# Patient Record
Sex: Female | Born: 1940 | Race: Black or African American | Hispanic: No | Smoking: Former smoker
Health system: Southern US, Community
[De-identification: ages and names within clinical notes are randomized; demographics above are authoritative.]

## PROBLEM LIST (undated history)

## (undated) ENCOUNTER — Emergency Department (HOSPITAL_COMMUNITY): Admission: EM

## (undated) DIAGNOSIS — I639 Cerebral infarction, unspecified: Secondary | ICD-10-CM

## (undated) DIAGNOSIS — I509 Heart failure, unspecified: Secondary | ICD-10-CM

## (undated) DIAGNOSIS — M1990 Unspecified osteoarthritis, unspecified site: Secondary | ICD-10-CM

## (undated) DIAGNOSIS — R51 Headache: Secondary | ICD-10-CM

## (undated) DIAGNOSIS — I10 Essential (primary) hypertension: Secondary | ICD-10-CM

## (undated) DIAGNOSIS — K219 Gastro-esophageal reflux disease without esophagitis: Secondary | ICD-10-CM

## (undated) DIAGNOSIS — K759 Inflammatory liver disease, unspecified: Secondary | ICD-10-CM

## (undated) DIAGNOSIS — Z8739 Personal history of other diseases of the musculoskeletal system and connective tissue: Secondary | ICD-10-CM

## (undated) DIAGNOSIS — I674 Hypertensive encephalopathy: Secondary | ICD-10-CM

## (undated) HISTORY — PX: MULTIPLE TOOTH EXTRACTIONS: SHX2053

## (undated) HISTORY — PX: ABDOMINAL HYSTERECTOMY: SHX81

## (undated) HISTORY — PX: TONSILLECTOMY: SUR1361

---

## 2007-06-06 ENCOUNTER — Inpatient Hospital Stay (HOSPITAL_COMMUNITY): Admission: EM | Admit: 2007-06-06 | Discharge: 2007-06-08 | Payer: Self-pay | Admitting: Emergency Medicine

## 2008-01-14 ENCOUNTER — Encounter (INDEPENDENT_AMBULATORY_CARE_PROVIDER_SITE_OTHER): Payer: Self-pay | Admitting: Diagnostic Radiology

## 2008-01-14 ENCOUNTER — Encounter: Admission: RE | Admit: 2008-01-14 | Discharge: 2008-01-14 | Payer: Self-pay | Admitting: Family Medicine

## 2008-09-01 ENCOUNTER — Encounter: Admission: RE | Admit: 2008-09-01 | Discharge: 2008-10-26 | Payer: Self-pay | Admitting: Family Medicine

## 2009-09-30 ENCOUNTER — Encounter: Payer: Self-pay | Admitting: Orthopedic Surgery

## 2009-10-05 ENCOUNTER — Ambulatory Visit: Payer: Self-pay | Admitting: Orthopedic Surgery

## 2009-10-05 DIAGNOSIS — M7730 Calcaneal spur, unspecified foot: Secondary | ICD-10-CM | POA: Insufficient documentation

## 2009-10-05 DIAGNOSIS — M7660 Achilles tendinitis, unspecified leg: Secondary | ICD-10-CM

## 2009-10-06 ENCOUNTER — Telehealth: Payer: Self-pay | Admitting: Orthopedic Surgery

## 2009-11-10 ENCOUNTER — Encounter: Payer: Self-pay | Admitting: Orthopedic Surgery

## 2010-06-27 NOTE — Letter (Signed)
Summary: Office note from PCP  Office note from PCP   Imported By: Jacklynn Ganong 10/06/2009 08:19:03  _____________________________________________________________________  External Attachment:    Type:   Image     Comment:   External Document

## 2010-06-27 NOTE — Progress Notes (Signed)
Summary: Referral to Dr. Lestine Box.  Phone Note Outgoing Call   Call placed by: Waldon Reining,  Oct 06, 2009 3:45 PM Call placed to: Specialist Action Taken: Information Sent Summary of Call: I faxed a referral for this patient to [facility] to see Dr. Lestine Box.

## 2010-06-27 NOTE — Letter (Signed)
Summary: *Orthopedic Consult Note  [facility]  [address]  [place], [place] [address]   Phone: [phone]  Fax: [phone]    Re:    AMEY HOSSAIN DOB:    1940/09/24   Dear: Dr Lysbeth Galas    Thank you for requesting that we see the above patient for consultation.  A copy of the detailed office note will be sent under separate cover, for your review.  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   Our recommendation is for: referral to  foot and ankle specialist       Thank you for this opportunity to look after your patient.  Sincerely,   Terrance Mass. MD.

## 2010-06-27 NOTE — Assessment & Plan Note (Signed)
Summary: RT HEEL PAIN/BONE SPUR/NEEDS XRAY/REF DR NYLAND/MEDIC,MEDICAI...   Vital Signs:  Patient profile:   70 year old female Height:      62 inches Weight:      145 pounds Pulse rate:   78 / minute Resp:     18 per minute  Vitals Entered By: Fuller Canada MD (Oct 05, 2009 10:17 AM)  Visit Type:  Initial Consult Referring Provider:  Dr. Lysbeth Galas Primary Provider:  Dr. Lysbeth Galas  CC:  rt heel pain.  History of Present Illness: a 70 year old female, who has retired presents with pain in the RIGHT heel for the last year.  She was seen by a local podiatrist and treated with injection and a cam walker, which she wore for 6 weeks. She did not improve.  Her primary care physician has asked for consultation.  She complains of sharp throbbing, stabbing, burning pain in the back of her heel and also plantar proximal. The heel, which is a 9/10, and it is constant. It came on gradually. She reports some catching and locking sensation in the ankle and foot. She also was treated with physical modalities of heat and ice.   Meds: Plavix, Amlodipine, Crestor, Vicodin 5, Robaxin 500, Pat a day Solalco.    Allergies (verified): 1)  ! Pcn  Past History:  Past Medical History: htn cholesterol hx of stroke   Past Surgical History: hysterectomy  Family History: na  Social History: Patient is widowed.  unemployed smokes 1 ppd cigs no alcohol 2 cups of coffee daily  Review of Systems Constitutional:  Denies weight loss, weight gain, fever, chills, and fatigue. Cardiovascular:  Denies chest pain, palpitations, fainting, and murmurs. Respiratory:  Complains of snoring; denies short of breath, wheezing, couch, tightness, pain on inspiration, and snoring . Gastrointestinal:  Complains of constipation; denies heartburn, nausea, vomiting, diarrhea, and blood in your stools. Genitourinary:  Denies frequency, urgency, difficulty urinating, painful urination, flank pain, and bleeding in  urine. Neurologic:  Denies numbness, tingling, unsteady gait, dizziness, tremors, and seizure. Musculoskeletal:  Complains of joint pain, swelling, and muscle pain; denies instability, stiffness, redness, and heat. Endocrine:  Denies excessive thirst, exessive urination, and heat or cold intolerance. Psychiatric:  Denies nervousness, depression, anxiety, and hallucinations. Skin:  Denies changes in the skin, poor healing, rash, itching, and redness. HEENT:  Denies blurred or double vision, eye pain, redness, and watering. Immunology:  Denies seasonal allergies, sinus problems, and allergic to bee stings. Hemoatologic:  Denies easy bleeding and brusing.  Physical Exam  Additional Exam:  Constitutional: vital signs see recorded values. General: normal development, nutrition, and grooming. No deformity. Body Habitus is small CDV: Observation and palpation was normal  Lymph: palpation of the lymph nodes were normal Skin: inspection and palpation of the skin revealed no abnormalities  Neuro: coordination: normal              DTR's normal              Sensation was normal  Psyche: Alert and oriented x 3. Mood was normal.  Affect: normal  MSK: Gait: normal   RIGHT foot. Examination shows she has mild pedis planus. She has poor subtalar motion. She has dorsiflexion 18, plantar flexion, 20. Ankle is stable. She is tender in the calcaneal area as the Achilles insertion to the bone. There is a large Haglund's type deformity and pump bump there. Her swelling of the tendon. There.  Plantarflexion. Strength is normal dorsiflexion, strength is normal.  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 Haglund's  deformity  Orders: Orthopedic Surgeon Referral (Ortho Surgeon) Consultation Level III [phone]) Ankle x-ray complete,  minimum 3 views ([address]454)  Problem # 2:  CALCANEAL SPUR (ICD-726.73) Assessment: New  Orders: Orthopedic Surgeon Referral (Ortho Surgeon) Consultation Level III [phone]) Ankle x-ray complete,  minimum 3 views ([id number])  Patient Instructions: 1)  Dr. Lestine Box 2)  Haglunds deformity right foot

## 2010-06-27 NOTE — Consult Note (Signed)
Summary: Consult [facility] Dr Lestine Box  Consult [facility] Dr Lestine Box   Imported By: Cammie Sickle 11/22/2009 15:32:41  _____________________________________________________________________  External Attachment:    Type:   Image     Comment:   External Document

## 2010-06-27 NOTE — Letter (Signed)
Summary: *Referral Letter  [facility]  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  Sincerely,  Fuller Canada MD

## 2010-06-27 NOTE — Letter (Signed)
Summary: History form  History form   Imported By: Jacklynn Ganong 10/06/2009 08:18:07  _____________________________________________________________________  External Attachment:    Type:   Image     Comment:   External Document

## 2010-10-10 NOTE — H&P (Signed)
NAMEMARQUITTA, [name] NO.:  [id number]   MEDICAL RECORD NO.:  [id number]          PATIENT TYPE:  INP   LOCATION:  A201                          FACILITY:  [facility]   PHYSICIAN:  Skeet Latch, DO    DATE OF BIRTH:  [birth year]   DATE OF ADMISSION:  06/06/2007  DATE OF DISCHARGE:  LH                              HISTORY & PHYSICAL   CHIEF COMPLAINT:  Headache.   HISTORY OF PRESENT ILLNESS:  This is a 70 year old, African-American  female who presents with a complaint of headache. The patient states  that she went to work and started developing a headache with nausea and  slight vomiting as well as chest pain and some mild right-sided weakness  and tingling. The patient states that she had an episode prior,  approximately 4 years ago, when she went to [facility] and was diagnosed  with a TIA. The patient states these symptoms come now and then and  stated that after she had these symptoms she went home and told her  husband that she needed to go to the hospital. EMS was called and the  patient was brought to the emergency room for evaluation. In the  emergency room, the patient was unable to move her right side and stated  that her pain in her right side had been approximately 8/10. In the ER,  it was discussed the patient may need a tPA but the patient's symptoms  started to resolve before tPA was given. The patient states she started  feeling better in the emergency room and her right sided has improved.  She still has weakness but the tingling and weakness is slowly  improving.   PAST MEDICAL HISTORY:  Previous TIAs, gout, hypertension, arthritis.   PAST SURGICAL HISTORY:  Hysterectomy.   SOCIAL HISTORY:  She is a 1-1/2 to 1 pack per day smoker for 50+ years.  She denies any alcohol or illicit drug use.   FAMILY HISTORY:  Unknown, patient was adopted.   ALLERGIES:  PENICILLIN.   CURRENT MEDICATIONS:  Propranolol/HCTZ 80/25 mg once a day.   REVIEW OF  SYSTEMS:  GENERAL:  Denies any fever, chills, weight gain or  weight loss. HEENT:  Positive for headache. CARDIOVASCULAR:  Positive  for slight chest pain, no palpitations. RESPIRATORY:  Denies any cough,  dyspnea or wheezing. GI:  Positive for some nausea and vomiting. No  diarrhea or abdominal pain. GU:  Unremarkable. MUSCULOSKELETAL:  Slight  neck pain. SKIN:  Unremarkable. NEUROLOGIC:  Positive for headache and  some right-sided weakness. PSYCHIATRIC:  Unremarkable.   PHYSICAL EXAMINATION:  VITAL SIGNS:  Temperature is 98.0, pulse 74,  respirations 20, blood pressure 152/71.  HEENT:  Atraumatic, normocephalic. No scleral icterus. EOMI, PERRLA.  Oral mucosa is moist.  NECK:  Soft, supple, nontender, nondistended. No JVD, no thyromegaly.  CARDIOVASCULAR:  Regular rate and rhythm. No murmurs, rubs or gallops.  LUNGS:  Clear to auscultation bilaterally. No rales, rhonchi or  wheezing.  ABDOMEN:  Soft, nontender, nondistended, positive bowel sounds. No  rigidity or guarding.  EXTREMITIES:  No clubbing, cyanosis or edema.  NEUROLOGIC:  Her right side shows +3 strength upper and lower  extremities. Decreased grasp reflex and decreased plantar flexion on the  right lower extremity. The patient is alert and oriented x3. Reflexes  seem to be intact.   LABORATORY DATA:  Sodium 141, potassium 3.8, chloride 107, CO2 29,  glucose 101, BUN 14, creatinine 0.88. CK-MB is less than 1, troponin  less than 0.05. PTT is 34. PT 12.5, INR 0.9. White count is 7.1,  hemoglobin 14.1, hematocrit 43.2, platelets 173.   RADIOLOGIC STUDIES:  The patient had a scan of her head, CT scan, with  no acute intracranial findings. She had a chest x-ray that showed no  acute process. EKG known sinus rhythm with some PVCs, left axis  deviation.   ASSESSMENT:  1. Transient ischemic attack.  2. Hypertension.   PLAN:  1. The patient was admitted to telemetry unit. The patient will be      treated for a TIA. She  will be maintained on aspirin daily.  2. The patient will get a neurologic consult. Secondary to neurology      not available on weekends, the patient will be seen in the next 48      hours. The patient will have neuro checks every 4 hours and then      every 8 hours.  3. Hypertension. The patient will be placed on her home medications      which is propranolol/HCTZ daily.  4. For pain management, the patient will be given oral as well as IV      management to control her headache at this time.   Of note, the emergency room at [facility] spoke with Dr. Orlin Hilding at  [facility] regarding tPA management. It was stated that improving  symptoms and minor symptoms are not an indication for tPA. It was stated  that patient was no longer a candidate for tPA, her symptoms were  resolving and patient had a TIA and not a CVA. It was stated that there  was no intervention that would be provided at [facility] and the patient  will be admitted to [facility] and monitored and treated as a  TIA with hypertension. We will continue to monitor the patient. If the  patient's status changes of any kind, the patient may need to be  transferred to [facility] secondary to we do not have a neurologist here  on the weekends. We will continue to monitor her closely. The patient  does seem to be improving also.      Skeet Latch, DO  Electronically Signed     SM/MEDQ  D:  06/07/2007  T:  06/07/2007  Job:  [id number]

## 2010-10-10 NOTE — Discharge Summary (Signed)
[name], [name] NO.:  [id number]   MEDICAL RECORD NO.:  [id number]          PATIENT TYPE:  INP   LOCATION:  A201                          FACILITY:  [facility]   PHYSICIAN:  Skeet Latch, DO    DATE OF BIRTH:  [birth year]   DATE OF ADMISSION:  06/06/2007  DATE OF DISCHARGE:  01/11/2009LH                               DISCHARGE SUMMARY   DISCHARGE DIAGNOSES:  1. Transfer ischemia attack.  2. Hypertension.   BRIEF HOSPITAL COURSE:  [name] is a 66-year African-American female,  presented with the complaint of headache.  The patient states after  going to work, she started developing a headache with nausea and  vomiting and some slight chest pain and also had some mild right-sided  weakness and tingling.  The patient has a history of two prior episodes  of TIAs and states that thought she was having a mini-stroke at the  time.  The patient states that she went home and told her family and  told her that she thought she was having mini-stroke and called EMS.  EMS arrived and brought to the patient to the emergency room for  evaluation.  The patient did have a prior TIA, approximately 4 years ago  and [facility] and was sent home after a few days.  In the  emergency room, the patient was unable to move her right side and was a  possible candidate for a TPA administration.  Prior to administration of  TPA, the patient's sensation and movement of her right upper and lower  extremity returned, and the patient did not receive TPA.  The patient's  symptoms have slowly improved, and the patient is moving her right upper  and lower extremity without any complaints at this time.  The patient  still has some weakness on her right side but is greatly improved, and  at this time feel the patient is stable enough to be discharged.   MEDICATIONS:  She will discharged on following medications:  1. Norvasc 5 mg 1 tablet p.o. daily.  2. Plavix 75 mg 1 tablet p.o.  daily.  3. Propranolol 80 mg daily.  4. HCTZ 25 mg daily.   EXAMINATION:  Her vitals on discharge:  Temperature is 98.2, pulse 60,  respirations 18, blood pressure 156/68.   LABS:  Sodium 145, potassium 3.8, chloride 111, CO2 29, glucose 100, BUN  7, creatinine 0.89, white count 4.9, hemoglobin 14.0, hematocrit 42.4,  platelet count 148.   CONDITION ON DISCHARGE:  Stable.   DISPOSITION:  The patient will be discharged to home.   INSTRUCTIONS:  Diet:  The patient is to maintain a low sodium, heart-  healthy diet.  The patient is not to do any heavy lifting for next 2 to3  weeks.  The patient is to follow up with her primary care physician in 1  week.  Patient is to have an appointment with a neurologist in the next  2 to  3 weeks.  The patient is also to have home physical therapy,  approximately 2 to 3 times a week for the next 3  to 4 weeks.   I will empirically place the patient on Plavix 75 mg once a day until  seen by the neurologist, secondary to her recurrent episodes of  transient ischemic attacks.  If the neurologist feels the patient does  not need to be on Plavix, this could be discontinued at that time but  feel the patient probably needs to be on Plavix, until she is seen by  neurology.  The patient understands the risks at this time and  understands the benefits.  The patient told to return to emergency room  or call EMS immediately, if she experiences any similar symptoms of this  kind.      Skeet Latch, DO  Electronically Signed     SM/MEDQ  D:  06/08/2007  T:  06/08/2007  Job:  [id number]

## 2010-10-10 NOTE — Group Therapy Note (Signed)
[name], [name] NO.:  [id number]   MEDICAL RECORD NO.:  [id number]          PATIENT TYPE:  INP   LOCATION:  A201                          FACILITY:  [facility]   PHYSICIAN:  Dorris Singh, DO    DATE OF BIRTH:  Aug 26, 1940   DATE OF PROCEDURE:  DATE OF DISCHARGE:                                 PROGRESS NOTE   The patient seen today.  States she has not had any episodes of  weakness, feels a little bit better from yesterday.  Has no complaints.   Her vitals are as follows:  Temperature 98.6, pulse of 57, respiration  16, blood pressure 164/90.  Generally this is an [place] American woman who is in no acute distress.  She is well-developed, well-nourished.  HEART:  Rate is regular rate and rhythm.  No murmurs, rubs, or gallops.  LUNGS:  Clear to auscultation bilaterally.  No wheezes, rales, or  rhonchi.  ABDOMEN:  Soft, nontender, nondistended.  EXTREMITIES:  Positive pulses, no ecchymosis, cyanosis, or edema.   LABS:  There were no labs ordered for today.  Will order labs tomorrow.   ASSESSMENT:  1. Transient ischemic attack.  2. Hypertension.   PLAN:  The patient will be monitored for another 12-18 hours.  If she is  still doing well, will consider discharging her if neurology has not  seen her prior to discharge.  Will also continue with neuro checks.  She  is currently on propranolol, but will place her on Norvasc as well.  The  patient also expressed some concern about getting her medications if she  is discharged on the weekend.  Explained to her that we can make sure  she gets her medications prior to discharge, if that would help her as  well.  We can change the schedule so that this can occur.   Other than that, she is continuing to improve.  She has not had a repeat  episode.  Will continue to monitor and change therapy as necessary.      Dorris Singh, DO  Electronically Signed     CB/MEDQ  D:  06/07/2007  T:  06/07/2007  Job:   [id number]

## 2011-02-15 LAB — BASIC METABOLIC PANEL
BUN: 7
CO2: 29
Calcium: 8.7
Chloride: 111
Creatinine, Ser: 0.89
GFR calc non Af Amer: >60
Glucose, Bld: 100 — ABNORMAL HIGH
Glucose, Bld: 101 — ABNORMAL HIGH
Sodium: 141

## 2011-02-15 LAB — DIFFERENTIAL
Basophils Absolute: 0
Basophils Absolute: 0
Basophils Relative: 1
Eosinophils Absolute: 0.2
Eosinophils Relative: 4
Monocytes Absolute: 0.4
Monocytes Relative: 6
Neutro Abs: 3.7
Neutrophils Relative %: 53

## 2011-02-15 LAB — POCT CARDIAC MARKERS: CKMB, poc: <1 — ABNORMAL LOW

## 2011-02-15 LAB — CBC
HCT: 42.4
MCHC: 32.8
MCHC: 33.1
MCV: 91.5
Platelets: 148 — ABNORMAL LOW
RBC: 4.75
RDW: 14.2
WBC: 7.1

## 2011-02-15 LAB — PROTIME-INR
INR: 0.9
Prothrombin Time: 12.5

## 2011-02-15 LAB — APTT: aPTT: 34

## 2012-02-11 ENCOUNTER — Encounter (HOSPITAL_COMMUNITY): Payer: Self-pay

## 2012-02-11 ENCOUNTER — Emergency Department (HOSPITAL_COMMUNITY): Payer: Medicare Other

## 2012-02-11 ENCOUNTER — Inpatient Hospital Stay (HOSPITAL_COMMUNITY)
Admission: EM | Admit: 2012-02-11 | Discharge: 2012-02-14 | DRG: 078 | Disposition: A | Discharge status: Home / Self Care | Payer: Medicare Other | Attending: Internal Medicine | Admitting: Internal Medicine

## 2012-02-11 DIAGNOSIS — Z79899 Other long term (current) drug therapy: Secondary | ICD-10-CM

## 2012-02-11 DIAGNOSIS — R4789 Other speech disturbances: Secondary | ICD-10-CM | POA: Diagnosis present

## 2012-02-11 DIAGNOSIS — R4781 Slurred speech: Secondary | ICD-10-CM | POA: Diagnosis present

## 2012-02-11 DIAGNOSIS — Z23 Encounter for immunization: Secondary | ICD-10-CM

## 2012-02-11 DIAGNOSIS — I10 Essential (primary) hypertension: Secondary | ICD-10-CM | POA: Diagnosis present

## 2012-02-11 DIAGNOSIS — M6281 Muscle weakness (generalized): Secondary | ICD-10-CM

## 2012-02-11 DIAGNOSIS — R471 Dysarthria and anarthria: Secondary | ICD-10-CM | POA: Diagnosis present

## 2012-02-11 DIAGNOSIS — I674 Hypertensive encephalopathy: Principal | ICD-10-CM | POA: Diagnosis present

## 2012-02-11 DIAGNOSIS — G959 Disease of spinal cord, unspecified: Secondary | ICD-10-CM | POA: Diagnosis present

## 2012-02-11 DIAGNOSIS — M5000 Cervical disc disorder with myelopathy, unspecified cervical region: Secondary | ICD-10-CM | POA: Diagnosis present

## 2012-02-11 DIAGNOSIS — K219 Gastro-esophageal reflux disease without esophagitis: Secondary | ICD-10-CM | POA: Diagnosis present

## 2012-02-11 DIAGNOSIS — Z880 Allergy status to penicillin: Secondary | ICD-10-CM

## 2012-02-11 DIAGNOSIS — F17200 Nicotine dependence, unspecified, uncomplicated: Secondary | ICD-10-CM | POA: Diagnosis present

## 2012-02-11 DIAGNOSIS — I6350 Cerebral infarction due to unspecified occlusion or stenosis of unspecified cerebral artery: Secondary | ICD-10-CM

## 2012-02-11 DIAGNOSIS — Z8673 Personal history of transient ischemic attack (TIA), and cerebral infarction without residual deficits: Secondary | ICD-10-CM

## 2012-02-11 DIAGNOSIS — R531 Weakness: Secondary | ICD-10-CM | POA: Diagnosis present

## 2012-02-11 DIAGNOSIS — R5381 Other malaise: Secondary | ICD-10-CM | POA: Diagnosis present

## 2012-02-11 DIAGNOSIS — I639 Cerebral infarction, unspecified: Secondary | ICD-10-CM

## 2012-02-11 HISTORY — DX: Cerebral infarction, unspecified: I63.9

## 2012-02-11 HISTORY — DX: Essential (primary) hypertension: I10

## 2012-02-11 HISTORY — DX: Gastro-esophageal reflux disease without esophagitis: K21.9

## 2012-02-11 LAB — RAPID URINE DRUG SCREEN, HOSP PERFORMED
Amphetamines: NOT DETECTED
Benzodiazepines: NOT DETECTED
Cocaine: NOT DETECTED
Opiates: NOT DETECTED
Tetrahydrocannabinol: NOT DETECTED

## 2012-02-11 LAB — CBC WITH DIFFERENTIAL/PLATELET
Basophils Absolute: 0 10*3/uL (ref 0.0–0.1)
Basophils Relative: 0 % (ref 0–1)
Eosinophils Relative: 2 % (ref 0–5)
Lymphocytes Relative: 40 % (ref 12–46)
MCHC: 33 g/dL (ref 30.0–36.0)
Neutro Abs: 3 10*3/uL (ref 1.7–7.7)
Platelets: 127 10*3/uL — ABNORMAL LOW (ref 150–400)
RDW: 13.3 % (ref 11.5–15.5)
WBC: 5.8 10*3/uL (ref 4.0–10.5)

## 2012-02-11 LAB — URINALYSIS, ROUTINE W REFLEX MICROSCOPIC
Bilirubin Urine: NEGATIVE
Glucose, UA: NEGATIVE mg/dL
Hgb urine dipstick: NEGATIVE
Specific Gravity, Urine: 1.01 (ref 1.005–1.030)
Urobilinogen, UA: 0.2 mg/dL (ref 0.0–1.0)

## 2012-02-11 LAB — ETHANOL: Alcohol, Ethyl (B): <11 mg/dL (ref 0–11)

## 2012-02-11 LAB — COMPREHENSIVE METABOLIC PANEL
ALT: 8 U/L (ref 0–35)
AST: 14 U/L (ref 0–37)
Albumin: 3.6 g/dL (ref 3.5–5.2)
CO2: 29 mEq/L (ref 19–32)
Calcium: 9.1 mg/dL (ref 8.4–10.5)
Chloride: 104 mEq/L (ref 96–112)
Creatinine, Ser: 0.85 mg/dL (ref 0.50–1.10)
GFR calc non Af Amer: 68 mL/min — ABNORMAL LOW (ref >90)
Sodium: 142 mEq/L (ref 135–145)

## 2012-02-11 NOTE — H&P (Signed)
Chief Complaint:  Left side weakness  HPI: 71 yo female 4 days of left sided weakness and one day of slurred speech.  Symptoms have almost completely resolved.  No n/v/d/fevers.  No headache.  No blurred vision.  No problem with gait. No cp or sob.  No rashes.  Review of Systems:  O/w neg  Past Medical History: Past Medical History  Diagnosis Date  . Stroke   . Hypertension   . Seizures   . GERD (gastroesophageal reflux disease)    Past Surgical History  Procedure Date  . Abdominal hysterectomy     Medications: Prior to Admission medications   Medication Sig Start Date End Date Taking? Authorizing Provider  amLODipine (NORVASC) 10 MG tablet Take 10 mg by mouth daily.   Yes Historical Provider, MD  clopidogrel (PLAVIX) 75 MG tablet Take 75 mg by mouth daily.   Yes Historical Provider, MD  Olopatadine HCl (PATADAY) 0.2 % SOLN Place 1 drop into both eyes daily.   Yes Historical Provider, MD  rosuvastatin (CRESTOR) 40 MG tablet Take 40 mg by mouth daily.   Yes Historical Provider, MD  varenicline (CHANTIX) 1 MG tablet Take 1 mg by mouth daily.   Yes Historical Provider, MD    Allergies:   Allergies  Allergen Reactions  . Penicillins Rash    Social History:  reports that she has been smoking.  She does not have any smokeless tobacco history on file. She reports that she does not drink alcohol or use illicit drugs.  Physical Exam: Filed Vitals:   02/11/12 1540 02/11/12 1742 02/11/12 1800 02/11/12 1931  BP: 185/73 156/64 150/81   Pulse: 64 63    Temp: 98.6 F (37 C)     TempSrc: Oral     Resp: 14 18 19    Height: 5\' 2"  (1.575 m)     Weight: 70.308 kg (155 lb)     SpO2: 100% 98%  98%   General appearance: alert, cooperative and no distress Lungs: clear to auscultation bilaterally Heart: regular rate and rhythm, S1, S2 normal, no murmur, click, rub or gallop Abdomen: soft, non-tender; bowel sounds normal; no masses,  no organomegaly Extremities: extremities normal,  atraumatic, no cyanosis or edema Pulses: 2+ and symmetric Skin: Skin color, texture, turgor normal. No rashes or lesions Neurologic: Grossly normal    Labs on Admission:   [facility] 02/11/12 1621  NA 142  K 3.9  CL 104  CO2 29  GLUCOSE 102*  BUN 14  CREATININE 0.85  CALCIUM 9.1  MG --  PHOS --    Basename 02/11/12 1621  AST 14  ALT 8  ALKPHOS 57  BILITOT 0.2*  PROT 6.9  ALBUMIN 3.6    Basename 02/11/12 1621  WBC 5.8  NEUTROABS 3.0  HGB 15.9*  HCT 48.2*  MCV 92.0  PLT 127*    Basename 02/11/12 1621  CKTOTAL --  CKMB --  CKMBINDEX --  TROPONINI <0.30   Radiological Exams on Admission: Dg Chest 2 View  02/11/2012  *RADIOLOGY REPORT*  Clinical Data: Slurred speech.  Smoker.  CHEST - 2 VIEW  Comparison: 06/06/2007  Findings: Heart size and mediastinal contours appear normal.  No pleural effusion or edema identified.  No airspace consolidation noted.  There is mild spondylosis noted within the thoracic spine.  IMPRESSION:  1.  No acute cardiopulmonary abnormalities.   Original Report Authenticated By: Rosealee Albee, M.D.    Ct Head Wo Contrast  02/11/2012  *RADIOLOGY REPORT*  Clinical Data: Headache,  slurred speech, left-sided weakness, history of right-sided CVA  CT HEAD WITHOUT CONTRAST  Technique:  Contiguous axial images were obtained from the base of the skull through the vertex without contrast.  Comparison: None available  Findings: Scattered periventricular and basal ganglia hypodensities compatible with microvascular ischemic disease.  Basal ganglia calcifications.  No CT evidence of acute large territory infarct. No intraparenchymal or extra-axial mass or hemorrhage.  Normal size and configuration of the ventricles and basilar cisterns.  No midline shift.  Exuberant calcification within the midline of the cerebral falx.  Hyperostosis frontalis.  Limited visualization of the paranasal sinuses and mastoid air cells are normal.  Regional soft tissues are normal.   No displaced calvarial fracture.  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   Original Report Authenticated By: Waynard Reeds, M.D.     Assessment/Plan Present on Admission:  71 yo female with slurred speech and left sided weakness .CVA (cerebral infarction) .Slurred speech .Left-sided weakness .HTN (hypertension)  R/o cva/tia.  cva pathway with full w/u.  Neuro consult.  Already on plavix add asa.  Cth neg.  ekg nsr.  Chamille Werntz A [phone] 02/11/2012, 7:52 PM

## 2012-02-11 NOTE — ED Notes (Signed)
Pt brought to er by [facility] from Dr. Joyce Copa office for c/o weakness. Pt states that she started having a headache 4 days ago, left side weakness that started yesterday, pt had walked to dr. Isidore Moos today with request to have blood pressure checked, pt states that her husband noticed that she started having slurred speech around 4am, pt c/o headache, left side weakness. Pt speech slurred at times, stutters at other times when she is in conversation.

## 2012-02-11 NOTE — ED Notes (Addendum)
Attempted to call report to RN for room 301.  Secretary states she will call me back.

## 2012-02-11 NOTE — ED Notes (Signed)
Dr. Onalee Hua in to speak with patient.  Patient agrees to be admitted. Has called and made arrangements for someone to stay with her spouse.

## 2012-02-11 NOTE — ED Notes (Addendum)
Due to answer on 1st part of the swallow  Screen  Questions - patient did NOT PASS swallow screen and advised will remain NPO until further evaluation.  States for the past month she has been choking on thin liquids at times - has not een on any special diet.  Also states that at times she must wipe her mouth because she feels like she is drooling.  . This also has been ongoing for past month, also has no teeth and thinks this contributes to her difficulty. No drool noted by this RN Order for SLP bedside swallow evaluation entered.

## 2012-02-11 NOTE — ED Notes (Signed)
Monitor NSR with rare PVC.  Patient desires to speak to admitting MD prior to admission.  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

## 2012-02-11 NOTE — ED Notes (Signed)
Room request changed to Telemetry after Dr. Onalee Hua in to speak with patient.

## 2012-02-11 NOTE — ED Notes (Signed)
Pt reports woke up this am at 4am and husband noticed she had slurred speech.  Says speech has been getting worse throughout the day.  Pt walked to her doctor's office to have bp checked.  Says bp was elevated at doctor's office.  160/112 at office, ems got 186/91.    EMS says pt has some left sided facial droop but is able to smile and raise eyebrows.  Also c/o left sided weakness.  Pt is able to move extremities but has some weakness.  Also c/o intermittent cramping in left shoulder.  Reports headache x 4 days.

## 2012-02-11 NOTE — ED Notes (Signed)
Hospitalist paged to advise of patients desire to speak with her prior to admission.

## 2012-02-11 NOTE — ED Provider Notes (Signed)
History    This chart was scribed for [employer]. Rubin Payor, MD, MD by Smitty Pluck. The patient was seen in room APA05 and the patient's care was started at 3:51PM.   CSN: [id number]  Arrival date & time 02/11/12  1537       Chief Complaint  Patient presents with  . Aphasia  . Headache    (Consider location/radiation/quality/duration/timing/severity/associated sxs/prior treatment) Patient is a 71 y.o. female presenting with headaches. The history is provided by the patient. No language interpreter was used.  Headache  Associated symptoms include nausea. Pertinent negatives include no fever, no shortness of breath and no vomiting.   [name] is a 71 y.o. female who presents to the Emergency Department sent by PCP complaining of constant, moderate headache onset 4 days ago and aphasia onset 1 day ago. Pt reports that she had stroke in past and left with right side weakness. She reports having left side weakness since yesterday. She went to PCP today for headaches and was told that her BP was high 160/112. Denies photophobia, chest pain, fever. Reports mild numbness, nausea and blurred vision. Denies any other pain.    Past Medical History  Diagnosis Date  . Stroke   . Hypertension   . Seizures   . GERD (gastroesophageal reflux disease)     Past Surgical History  Procedure Date  . Abdominal hysterectomy     No family history on file.  History  Substance Use Topics  . Smoking status: Current Some Day Smoker  . Smokeless tobacco: Not on file  . Alcohol Use: No    OB History    Grav Para Term Preterm Abortions TAB SAB Ect Mult Living                  Review of Systems  Constitutional: Negative for fever and chills.  Eyes: Negative for photophobia.  Respiratory: Negative for cough and shortness of breath.   Cardiovascular: Negative for chest pain.  Gastrointestinal: Positive for nausea. Negative for vomiting and diarrhea.  Neurological: Positive for weakness,  numbness and headaches.    Allergies  Penicillins  Home Medications  No current outpatient prescriptions on file.  BP 185/73  Pulse 64  Temp 98.6 F (37 C) (Oral)  Resp 14  Ht 5\' 2"  (1.575 m)  Wt 155 lb (70.308 kg)  BMI 28.35 kg/m2  SpO2 100%  Physical Exam  Nursing note and vitals reviewed. Constitutional: She is oriented to person, place, and time. She appears well-developed. No distress.  HENT:  Head: Normocephalic and atraumatic.  Eyes: EOM are normal.       Right lid droop  Cardiovascular: Normal rate, regular rhythm and normal heart sounds.   No murmur heard. Pulmonary/Chest: Effort normal and breath sounds normal. No respiratory distress. She has no wheezes. She has no rales.  Abdominal: Soft. She exhibits no distension. There is no tenderness. There is no rebound.  Neurological: She is alert and oriented to person, place, and time.       Radial, medial and ulnar nerve intact  Decreased straight leg raise on left side Decreased sensation of left face, left upper extremity and left lower extremity   Nl bilateral grip strength   Skin: Skin is warm and dry.  Psychiatric: She has a normal mood and affect. Her behavior is normal.    ED Course  Procedures (including critical care time) DIAGNOSTIC STUDIES: Oxygen Saturation is 100% on room air, normal by my interpretation.    COORDINATION  OF CARE: 3:55 PM Discussed pt ED treatment with pt   Results for orders placed during the hospital encounter of 02/11/12  CBC WITH DIFFERENTIAL      Component Value Range   WBC 5.8  4.0 - 10.5 K/uL   RBC 5.24 (*) 3.87 - 5.11 MIL/uL   Hemoglobin 15.9 (*) 12.0 - 15.0 g/dL   HCT 45.4 (*) 09.8 - 11.9 %   MCV 92.0  78.0 - 100.0 fL   MCH 30.3  26.0 - 34.0 pg   MCHC 33.0  30.0 - 36.0 g/dL   RDW 14.7  82.9 - 56.2 %   Platelets 127 (*) 150 - 400 K/uL   Neutrophils Relative 52  43 - 77 %   Neutro Abs 3.0  1.7 - 7.7 K/uL   Lymphocytes Relative 40  12 - 46 %   Lymphs Abs 2.3  0.7 -  4.0 K/uL   Monocytes Relative 5  3 - 12 %   Monocytes Absolute 0.3  0.1 - 1.0 K/uL   Eosinophils Relative 2  0 - 5 %   Eosinophils Absolute 0.1  0.0 - 0.7 K/uL   Basophils Relative 0  0 - 1 %   Basophils Absolute 0.0  0.0 - 0.1 K/uL  COMPREHENSIVE METABOLIC PANEL      Component Value Range   Sodium 142  135 - 145 mEq/L   Potassium 3.9  3.5 - 5.1 mEq/L   Chloride 104  96 - 112 mEq/L   CO2 29  19 - 32 mEq/L   Glucose, Bld 102 (*) 70 - 99 mg/dL   BUN 14  6 - 23 mg/dL   Creatinine, Ser 1.30  0.50 - 1.10 mg/dL   Calcium 9.1  8.4 - 86.5 mg/dL   Total Protein 6.9  6.0 - 8.3 g/dL   Albumin 3.6  3.5 - 5.2 g/dL   AST 14  0 - 37 U/L   ALT 8  0 - 35 U/L   Alkaline Phosphatase 57  39 - 117 U/L   Total Bilirubin 0.2 (*) 0.3 - 1.2 mg/dL   GFR calc non Af Amer 68 (*) >90 mL/min   GFR calc Af Amer 79 (*) >90 mL/min  URINALYSIS, ROUTINE W REFLEX MICROSCOPIC      Component Value Range   Color, Urine YELLOW  YELLOW   APPearance CLEAR  CLEAR   Specific Gravity, Urine 1.010  1.005 - 1.030   pH 7.0  5.0 - 8.0   Glucose, UA NEGATIVE  NEGATIVE mg/dL   Hgb urine dipstick NEGATIVE  NEGATIVE   Bilirubin Urine NEGATIVE  NEGATIVE   Ketones, ur NEGATIVE  NEGATIVE mg/dL   Protein, ur NEGATIVE  NEGATIVE mg/dL   Urobilinogen, UA 0.2  0.0 - 1.0 mg/dL   Nitrite NEGATIVE  NEGATIVE   Leukocytes, UA NEGATIVE  NEGATIVE  ETHANOL      Component Value Range   Alcohol, Ethyl (B) <11  0 - 11 mg/dL  URINE RAPID DRUG SCREEN (HOSP PERFORMED)      Component Value Range   Opiates NONE DETECTED  NONE DETECTED   Cocaine NONE DETECTED  NONE DETECTED   Benzodiazepines NONE DETECTED  NONE DETECTED   Amphetamines NONE DETECTED  NONE DETECTED   Tetrahydrocannabinol NONE DETECTED  NONE DETECTED   Barbiturates NONE DETECTED  NONE DETECTED  TROPONIN I      Component Value Range   Troponin I <0.30  <0.30 ng/mL    Dg Chest 2 View  02/11/2012  *  RADIOLOGY REPORT*  Clinical Data: Slurred speech.  Smoker.  CHEST - 2 VIEW   Comparison: 06/06/2007  Findings: Heart size and mediastinal contours appear normal.  No pleural effusion or edema identified.  No airspace consolidation noted.  There is mild spondylosis noted within the thoracic spine.  IMPRESSION:  1.  No acute cardiopulmonary abnormalities.   Original Report Authenticated By: Rosealee Albee, M.D.    Ct Head Wo Contrast  02/11/2012  *RADIOLOGY REPORT*  Clinical Data: Headache, slurred speech, left-sided weakness, history of right-sided CVA  CT HEAD WITHOUT CONTRAST  Technique:  Contiguous axial images were obtained from the base of the skull through the vertex without contrast.  Comparison: None available  Findings: Scattered periventricular and basal ganglia hypodensities compatible with microvascular ischemic disease.  Basal ganglia calcifications.  No CT evidence of acute large territory infarct. No intraparenchymal or extra-axial mass or hemorrhage.  Normal size and configuration of the ventricles and basilar cisterns.  No midline shift.  Exuberant calcification within the midline of the cerebral falx.  Hyperostosis frontalis.  Limited visualization of the paranasal sinuses and mastoid air cells are normal.  Regional soft tissues are normal.  No displaced calvarial fracture.  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   Original Report Authenticated By: Waynard Reeds, M.D.    6:08 PM Recheck: Discussed lab results and treatment course with pt. Pt will be admitted.    No diagnosis found.   Date: 02/11/2012  Rate: 62  Rhythm: normal sinus rhythm  QRS Axis: normal  Intervals: normal  ST/T Wave abnormalities: normal  Conduction Disutrbances:q waves anteriorly  Narrative Interpretation:   Old EKG Reviewed: changes noted    MDM  Patient with left-sided weakness slurred speech and facial droop. Previous strokes, but with reported right-sided weakness. Blood pressure has been elevated at the doctor's office.  Somewhat inconsistent exam. Patient shows no bleed on head CT. She'll be admitted to medicine for further workup.   *I personally performed the services described in this documentation, which was scribed in my presence. The recorded information has been reviewed and considered.        Juliet Rude. Rubin Payor, MD 02/11/12 [phone]

## 2012-02-11 NOTE — ED Notes (Signed)
Call back in 5 minutes per RN - in with another patient at present

## 2012-02-12 ENCOUNTER — Inpatient Hospital Stay (HOSPITAL_COMMUNITY): Payer: Medicare Other

## 2012-02-12 DIAGNOSIS — I6789 Other cerebrovascular disease: Secondary | ICD-10-CM

## 2012-02-12 LAB — LIPID PANEL: LDL Cholesterol: 61 mg/dL (ref 0–99)

## 2012-02-12 LAB — HEMOGLOBIN A1C: Hgb A1c MFr Bld: 5.7 % — ABNORMAL HIGH (ref <5.7)

## 2012-02-12 MED ORDER — DEXAMETHASONE SODIUM PHOSPHATE 10 MG/ML IJ SOLN: 10.0000 mg | Freq: Four times a day (QID) | INTRAMUSCULAR | Status: DC

## 2012-02-12 MED ORDER — DEXAMETHASONE SODIUM PHOSPHATE 10 MG/ML IJ SOLN
10.0000 mg | Freq: Four times a day (QID) | INTRAMUSCULAR | Status: AC
  Administered 2012-02-12 – 2012-02-13 (×4): 10 mg via INTRAVENOUS
  Filled 2012-02-12 (×4): qty 1

## 2012-02-12 MED ORDER — ATORVASTATIN CALCIUM 40 MG PO TABS
80.0000 mg | ORAL_TABLET | Freq: Every day | ORAL | Status: DC
  Administered 2012-02-12 – 2012-02-13 (×2): 80 mg via ORAL
  Filled 2012-02-12 (×4): qty 2

## 2012-02-12 MED ORDER — CLOPIDOGREL BISULFATE 75 MG PO TABS
75.0000 mg | ORAL_TABLET | Freq: Every day | ORAL | Status: DC
  Administered 2012-02-12 – 2012-02-14 (×3): 75 mg via ORAL
  Filled 2012-02-12 (×3): qty 1

## 2012-02-12 MED ORDER — PANTOPRAZOLE SODIUM 40 MG PO TBEC
40.0000 mg | DELAYED_RELEASE_TABLET | Freq: Every day | ORAL | Status: DC
  Administered 2012-02-12 – 2012-02-13 (×2): 40 mg via ORAL
  Filled 2012-02-12 (×2): qty 1

## 2012-02-12 MED ORDER — ASPIRIN 300 MG RE SUPP
300.0000 mg | Freq: Every day | RECTAL | Status: DC
  Filled 2012-02-12 (×4): qty 1

## 2012-02-12 MED ORDER — DEXAMETHASONE SODIUM PHOSPHATE 4 MG/ML IJ SOLN
4.0000 mg | Freq: Four times a day (QID) | INTRAMUSCULAR | Status: DC
  Administered 2012-02-13 – 2012-02-14 (×4): 4 mg via INTRAVENOUS
  Filled 2012-02-12 (×5): qty 1

## 2012-02-12 MED ORDER — SODIUM CHLORIDE 0.9 % IV SOLN
INTRAVENOUS | Status: AC
  Administered 2012-02-12: 01:00:00 via INTRAVENOUS

## 2012-02-12 MED ORDER — ASPIRIN 325 MG PO TABS
325.0000 mg | ORAL_TABLET | Freq: Every day | ORAL | Status: DC
  Administered 2012-02-12 – 2012-02-14 (×3): 325 mg via ORAL
  Filled 2012-02-12 (×3): qty 1

## 2012-02-12 MED ORDER — AMLODIPINE BESYLATE 5 MG PO TABS
10.0000 mg | ORAL_TABLET | Freq: Every day | ORAL | Status: DC
  Administered 2012-02-12 – 2012-02-14 (×3): 10 mg via ORAL
  Filled 2012-02-12 (×3): qty 2

## 2012-02-12 MED ORDER — LISINOPRIL 10 MG PO TABS
10.0000 mg | ORAL_TABLET | Freq: Every day | ORAL | Status: DC
  Administered 2012-02-12 – 2012-02-13 (×2): 10 mg via ORAL
  Filled 2012-02-12 (×2): qty 1

## 2012-02-12 MED ORDER — VARENICLINE TARTRATE 1 MG PO TABS
1.0000 mg | ORAL_TABLET | Freq: Every day | ORAL | Status: DC
  Administered 2012-02-12 – 2012-02-14 (×3): 1 mg via ORAL
  Filled 2012-02-12 (×4): qty 1

## 2012-02-12 MED ORDER — INFLUENZA VIRUS VACC SPLIT PF IM SUSP
0.5000 mL | INTRAMUSCULAR | Status: AC
  Administered 2012-02-13: 0.5 mL via INTRAMUSCULAR
  Filled 2012-02-12: qty 0.5

## 2012-02-12 NOTE — Progress Notes (Signed)
In view of the normal MRI brain scan, I had ordered a cervical spine MRI. This was highly abnormal with changes seen at T6-C7 and C7-T1. There is some compression of the cord at C6-C7. I discussed this case and MRI findings with neurosurgery, Dr. Danielle Dess (phone [phone]). Dr. Danielle Dess has recommended intravenous Decadron which have started the patient on. He recommended to finish up the workup for CVA and if the patient has not improved with the use of steroids, that he should be called back as the patient may indeed need surgery.

## 2012-02-12 NOTE — Progress Notes (Signed)
Subjective: This lady was admitted with left-sided weakness in the arm and the leg and left-sided facial numbness as well as slurred speech. So far the workup is really negative with MRI brain scan is entirely normal with no evidence of acute CVA. She feels that she is also improving as far as her weakness is concerned. She has elevated blood pressure.           Physical Exam: Blood pressure 171/84, pulse 70, temperature 97.6 F (36.4 C), temperature source Oral, resp. rate 18, height 5\' 2"  (1.575 m), weight 70.4 kg (155 lb 3.3 oz), SpO2 98.00%. She looks systemically well. There is no facial asymmetry. There is objectively some left arm and left leg weakness. Heart sounds are present and normal. Lung fields are clear. She is alert and orientated.   Investigations:     Basic Metabolic Panel:  Basename 02/11/12 1621  NA 142  K 3.9  CL 104  CO2 29  GLUCOSE 102*  BUN 14  CREATININE 0.85  CALCIUM 9.1  MG --  PHOS --   Liver Function Tests:  [facility] 02/11/12 1621  AST 14  ALT 8  ALKPHOS 57  BILITOT 0.2*  PROT 6.9  ALBUMIN 3.6     CBC:  Basename 02/11/12 1621  WBC 5.8  NEUTROABS 3.0  HGB 15.9*  HCT 48.2*  MCV 92.0  PLT 127*    Dg Chest 2 View  02/11/2012  *RADIOLOGY REPORT*  Clinical Data: Slurred speech.  Smoker.  CHEST - 2 VIEW  Comparison: 06/06/2007  Findings: Heart size and mediastinal contours appear normal.  No pleural effusion or edema identified.  No airspace consolidation noted.  There is mild spondylosis noted within the thoracic spine.  IMPRESSION:  1.  No acute cardiopulmonary abnormalities.   Original Report Authenticated By: Rosealee Albee, M.D.    Ct Head Wo Contrast  02/11/2012  *RADIOLOGY REPORT*  Clinical Data: Headache, slurred speech, left-sided weakness, history of right-sided CVA  CT HEAD WITHOUT CONTRAST  Technique:  Contiguous axial images were obtained from the base of the skull through the vertex without contrast.   Comparison: None available  Findings: Scattered periventricular and basal ganglia hypodensities compatible with microvascular ischemic disease.  Basal ganglia calcifications.  No CT evidence of acute large territory infarct. No intraparenchymal or extra-axial mass or hemorrhage.  Normal size and configuration of the ventricles and basilar cisterns.  No midline shift.  Exuberant calcification within the midline of the cerebral falx.  Hyperostosis frontalis.  Limited visualization of the paranasal sinuses and mastoid air cells are normal.  Regional soft tissues are normal.  No displaced calvarial fracture.  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   Original Report Authenticated By: Waynard Reeds, M.D.    Mr [facility] Wo Contrast  02/12/2012  *RADIOLOGY REPORT*  Clinical Data:  4 days of headaches and left-sided weakness and slurred speech for 1 day without known injury.  Hypertension.  MRI BRAIN WITHOUT CONTRAST MRA HEAD WITHOUT CONTRAST  Technique: Multiplanar, multiecho pulse sequences of the brain and surrounding structures were obtained according to standard protocol without intravenous contrast.  Angiographic images of the head were obtained using MRA technique without contrast.  Comparison: 02/11/2012 CT.  No comparison MR.  MRI HEAD  Findings:  No acute infarct.  No intracranial hemorrhage.  Moderate small vessel disease type changes.  No hydrocephalus.  Major intracranial vascular structures are patent.  Hyperostosis frontalis interna.  Ossification of the falx  incidentally noted.  Mild exophthalmos.  Partially empty sella.  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 This may be an incidental finding.  If the patient develops diplopia with upper gaze then this can be reevaluated to exclude growth.  Prominence of soft tissue in the posterior-superior nasopharynx may represent  complex Thornwaldt cysts.  Mucosal abnormality not excluded.   No secondary findings of eustachian tube dysfunction.  IMPRESSION: No acute infarct.  Moderate small vessel disease type changes.  No hydrocephalus.  Pineal cyst as noted above.  Prominence of soft tissue in the posterior-superior nasopharynx may represent  complex Thornwaldt cysts.  Mucosal abnormality not excluded.  No secondary findings of eustachian tube dysfunction.  MRA HEAD  Findings: Anterior circulation without medium or large size vessel significant stenosis or occlusion.  Scattered tiny bulges of the M1 segment of the middle cerebral artery bilaterally which may represent origin of vessels rather than aneurysms at the resolution of the present exam.  Left vertebral artery is dominant.  Mild to moderate narrowing of the distal right vertebral artery.  Moderate irregularity of the PICA bilaterally.  Nonvisualization AICA bilaterally.  Mild irregularity and narrowing involving portions of the superior cerebellar arteries and posterior cerebral arteries.  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   Original Report Authenticated By: Fuller Canada, M.D.    Mr Brain Wo Contrast  02/12/2012  *RADIOLOGY REPORT*  Clinical Data:  4 days of headaches and left-sided weakness and slurred speech for 1 day without known injury.  Hypertension.  MRI BRAIN WITHOUT CONTRAST MRA HEAD WITHOUT CONTRAST  Technique: Multiplanar, multiecho pulse sequences of the brain and surrounding structures were obtained according to standard protocol without intravenous contrast.  Angiographic images of the head were obtained using MRA technique without contrast.  Comparison: 02/11/2012 CT.  No comparison MR.  MRI HEAD  Findings:  No acute infarct.  No intracranial hemorrhage.  Moderate small vessel disease type changes.  No hydrocephalus.  Major intracranial vascular structures are patent.  Hyperostosis frontalis interna.  Ossification of the falx incidentally noted.  Mild  exophthalmos.  Partially empty sella.  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 This may be an incidental finding.  If the patient develops diplopia with upper gaze then this can be reevaluated to exclude growth.  Prominence of soft tissue in the posterior-superior nasopharynx may represent  complex Thornwaldt cysts.  Mucosal abnormality not excluded.  No secondary findings of eustachian tube dysfunction.  IMPRESSION: No acute infarct.  Moderate small vessel disease type changes.  No hydrocephalus.  Pineal cyst as noted above.  Prominence of soft tissue in the posterior-superior nasopharynx may represent  complex Thornwaldt cysts.  Mucosal abnormality not excluded.  No secondary findings of eustachian tube dysfunction.  MRA HEAD  Findings: Anterior circulation without medium or large size vessel significant stenosis or occlusion.  Scattered tiny bulges of the M1 segment of the middle cerebral artery bilaterally which may represent origin of vessels rather than aneurysms at the resolution of the present exam.  Left vertebral artery is dominant.  Mild to moderate narrowing of the distal right vertebral artery.  Moderate irregularity of the PICA bilaterally.  Nonvisualization AICA bilaterally.  Mild irregularity and narrowing involving portions of the superior cerebellar arteries and posterior cerebral arteries.  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   Original Report Authenticated By: Fuller Canada, M.D.  Medications: I have reviewed the patient's current medications.  Impression: 1. Left-sided weakness, unclear etiology. 2. Hypertensive urgency.     Plan: 1. Add lisinopril to her antihypertensive medications. 2. MRI cervical spine to find a source of her left-sided weakness. 3. Neurology consultation.     LOS: 1 day   Wilson Singer Pager [phone]  02/12/2012, 10:44 AM

## 2012-02-12 NOTE — Evaluation (Signed)
Clinical/Bedside Swallow Evaluation Patient Details  Name: [name] MRN: [id number] Date of Birth: [birth year]  Today's Date: 02/12/2012 Time: 1100-1129 SLP Time Calculation (min): 29 min  Past Medical History:  Past Medical History  Diagnosis Date  . Stroke   . Hypertension   . Seizures   . GERD (gastroesophageal reflux disease)    Past Surgical History:  Past Surgical History  Procedure Date  . Abdominal hysterectomy    HPI:  [name] is a 71 yo female who was admitted after 4 days of left sided weakness and 1 day of slurred speech. Her symptoms have nearly resolved per patient. CT head showed microvascular ischemic disease without definite acute intracranial process. She initially failed the RN swallow screen, but then passed it once she was moved to the floor.    Assessment / Plan / Recommendation Clinical Impression  [name] reports a 4-day history of dysphagia with liquids and sometimes coughs on her saliva. She says this has gotten much better however. Pt with GERD and sometimes takes PPI once per day and sometimes twice. One cough elicited after straw sips thin water. Pt cued to take single sips but swallow two times with head in neutral to down position and this seemed to help. Pt does have some limited lingual ROM to left sulcus so suspect some premature spillage of liquids. Pt voiced understanding of using compensatory strategies for swallowing thin liquids and was able to demonstrate independently.    Aspiration Risk  Mild    Diet Recommendation Regular;Thin liquid   Liquid Administration via: Cup;No straw Medication Administration: Whole meds with liquid Supervision: Patient able to self feed Compensations: Small sips/bites;Multiple dry swallows after each bite/sip Postural Changes and/or Swallow Maneuvers: Seated upright 90 degrees;Upright 30-60 min after meal    Other  Recommendations Oral Care Recommendations: Oral care BID;Patient  independent with oral care Other Recommendations: Clarify dietary restrictions   Follow Up Recommendations  None    Frequency and Duration min 1 x/week  1 week      SLP Swallow Goals Patient will consume recommended diet without observed clinical signs of aspiration with: Independent assistance Patient will utilize recommended strategies during swallow to increase swallowing safety with: Independent assistance   Swallow Study Prior Functional Status  Type of Home: House Lives With: Spouse Available Help at Discharge: Family Vocation: Retired    General Date of Onset: 02/08/12 HPI: [name] is a 71 yo female who was admitted after 4 days of left sided weakness and 1 day of slurred speech. Her symptoms have nearly resolved per patient. CT head showed microvascular ischemic disease without definite acute intracranial process. She initially failed the RN swallow screen, but then passed it once she was moved to the floor.  Type of Study: Bedside swallow evaluation Diet Prior to this Study: Regular;Thin liquids Temperature Spikes Noted: No Respiratory Status: Room air History of Recent Intubation: No Behavior/Cognition: Alert;Cooperative;Pleasant mood Oral Cavity - Dentition: Edentulous Self-Feeding Abilities: Able to feed self Patient Positioning: Upright in bed Baseline Vocal Quality: Clear Volitional Cough: Strong Volitional Swallow: Able to elicit    Oral/Motor/Sensory Function Overall Oral Motor/Sensory Function: Appears within functional limits for tasks assessed Lingual ROM: Reduced left Facial Symmetry: Within Functional Limits Velum: Within Functional Limits Mandible: Within Functional Limits   Ice Chips Ice chips: Within functional limits Presentation: Spoon   Thin Liquid Thin Liquid: Impaired Presentation: Straw;Cup Pharyngeal  Phase Impairments: Suspected delayed Swallow;Cough - Immediate Other Comments: Immediate cough after initial straw  sips of water,  no coughing when cued to take one cup sip at a time with 2 swallows, head neutral to down    Nectar Thick Nectar Thick Liquid: Not tested   Honey Thick Honey Thick Liquid: Not tested   Puree Puree: Within functional limits Presentation: Spoon;Self Fed   Solid   Thank you,  Havery Moros, CCC-SLP [phone]     Solid: Within functional limits Presentation: Self Fed       Ellie Spickler 02/12/2012,1:22 PM

## 2012-02-12 NOTE — Progress Notes (Signed)
UR Chart Review Completed  

## 2012-02-12 NOTE — Evaluation (Signed)
Physical Therapy Evaluation Patient Details Name: [name] MRN: [id number] DOB: [birth year] Today's Date: 02/12/2012 Time: 1000-1033 PT Time Calculation (min): 33 min  PT Assessment / Plan / Recommendation Clinical Impression  Pt was seen for eval.  She is very close to functional baseline, but on MMT she has some significant weakness in the L hip which does affect high level balance.  She  ambulates about 3 miles/day at home normally.  This might be enough to help her improve her strength, but she could consider outpatient PT for formal strengthening/balance focus.    PT Assessment  All further PT needs can be met in the next venue of care    Follow Up Recommendations  Outpatient PT    Barriers to Discharge        Equipment Recommendations  None recommended by PT    Recommendations for Other Services     Frequency      Precautions / Restrictions Precautions Precautions: None Restrictions Weight Bearing Restrictions: No   Pertinent Vitals/Pain       Mobility  Bed Mobility Bed Mobility: Supine to Sit;Sit to Supine Supine to Sit: 7: Independent Sit to Supine: 7: Independent Transfers Transfers: Sit to Stand;Stand to Sit Sit to Stand: 7: Independent Stand to Sit: 7: Independent    Exercises     PT Diagnosis: Difficulty walking  PT Problem List: Decreased strength;Decreased balance PT Treatment Interventions:     PT Goals    Visit Information  Last PT Received On: 02/12/12    Subjective Data  Subjective: I'm feeling better Patient Stated Goal: needs to be able to take care of her husband   Prior Functioning  Home Living Lives With: Spouse Available Help at Discharge: Family Type of Home: House Home Access: Level entry Home Layout: One level Home Adaptive Equipment: None Prior Function Level of Independence: Independent Able to Take Stairs?: Yes Driving: No Vocation: Retired Musician: No difficulties Dominant Hand: Right    Cognition  Overall Cognitive Status: Appears within functional limits for tasks assessed/performed Arousal/Alertness: Awake/alert Orientation Level: Appears intact for tasks assessed Behavior During Session: [facility] for tasks performed    Extremity/Trunk Assessment Right Lower Extremity Assessment RLE ROM/Strength/Tone: Deficits RLE ROM/Strength/Tone Deficits: significant ankle weakness in PF and DF, 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.....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 RLE Sensation: WFL - Light Touch;WFL - Proprioception RLE Coordination: WFL - gross motor Left Lower Extremity Assessment LLE ROM/Strength/Tone: Deficits LLE ROM/Strength/Tone Deficits: strength at hip is 2/5 on MMT, but functionally strength is functional...knee strength is 4/5...ankle strength is 2/5 LLE Sensation: WFL - Light Touch;WFL - Proprioception LLE Coordination: WFL - gross motor   Balance Balance Balance Assessed: Yes Dynamic Standing Balance Dynamic Standing - Balance Support: No upper extremity supported Dynamic Standing - Level of Assistance: 4: Min assist High Level Balance High Level Balance Activites: Side stepping;Backward walking;Direction changes High Level Balance Comments: no obvious decrease in balance as long as she moves slowly, but balance falters with quick movements or when truning her head (?visual problem)  End of Session PT - End of Session Equipment Utilized During Treatment: Gait belt Activity Tolerance: Patient tolerated treatment well Patient left: in bed;with call bell/phone within reach Nurse Communication: Mobility status  GP     Konrad Penta 02/12/2012, 10:48 AM

## 2012-02-12 NOTE — Progress Notes (Signed)
*  PRELIMINARY RESULTS* Echocardiogram 2D Echocardiogram has been performed.  [name] 02/12/2012, 2:20 PM

## 2012-02-12 NOTE — Care Management Note (Signed)
    Page 1 of 1   02/14/2012     11:34:49 AM   CARE MANAGEMENT NOTE 02/14/2012  Patient:  [name], [name]   Account Number:  [id number]  Date Initiated:  02/12/2012  Documentation initiated by:  Sharrie Rothman  Subjective/Objective Assessment:   Pt admitted from home with possible CVA. Pt lives with husband and is very independent with ADL's. Pt will return home at discharge.     Action/Plan:   PT recommends outpt PT. Pt has requested the outpt clinic in [place]. Pt uses [facility] for transportation to MD appts and will use [facility] for transportation to PT. No DME needs per PT.   Anticipated DC Date:  02/13/2012   Anticipated DC Plan:  HOME/SELF CARE      DC Planning Services  CM consult      Choice offered to / List presented to:             Status of service:  Completed, signed off Medicare Important Message given?  YES (If response is "NO", the following Medicare IM given date fields will be blank) Date Medicare IM given:  02/14/2012 Date Additional Medicare IM given:    Discharge Disposition:  HOME/SELF CARE  Per UR Regulation:    If discussed at Long Length of Stay Meetings, dates discussed:    Comments:  02/14/12 1133 Arlyss Queen, RN BSN CM Pt discharged home today. No CM/HH needs noted.  02/12/12 1135 Arlyss Queen, RN BSN CM

## 2012-02-13 ENCOUNTER — Inpatient Hospital Stay (HOSPITAL_COMMUNITY): Payer: Medicare Other

## 2012-02-13 DIAGNOSIS — I674 Hypertensive encephalopathy: Principal | ICD-10-CM | POA: Diagnosis present

## 2012-02-13 DIAGNOSIS — M4712 Other spondylosis with myelopathy, cervical region: Secondary | ICD-10-CM

## 2012-02-13 DIAGNOSIS — G959 Disease of spinal cord, unspecified: Secondary | ICD-10-CM | POA: Diagnosis present

## 2012-02-13 LAB — BASIC METABOLIC PANEL
CO2: 24 mEq/L (ref 19–32)
Chloride: 108 mEq/L (ref 96–112)
Sodium: 142 mEq/L (ref 135–145)

## 2012-02-13 MED ORDER — LISINOPRIL 10 MG PO TABS
20.0000 mg | ORAL_TABLET | Freq: Every day | ORAL | Status: DC
  Administered 2012-02-14: 20 mg via ORAL
  Filled 2012-02-13: qty 2

## 2012-02-13 MED ORDER — LISINOPRIL 10 MG PO TABS
10.0000 mg | ORAL_TABLET | ORAL | Status: AC
  Administered 2012-02-13: 10 mg via ORAL
  Filled 2012-02-13: qty 1

## 2012-02-13 NOTE — Evaluation (Signed)
Occupational Therapy Evaluation Patient Details Name: [name] MRN: [id number] DOB: 12/30/1940 Today's Date: 02/13/2012 Time: 7829-5621 OT Time Calculation (min): 16 min  OT Assessment / Plan / Recommendation Clinical Impression  Patient is a 71 y/o female s/p left-side weakness presenting to acute OT with all education complete. Patient is physically functioning at baseline with minor left UE weakness. Patient does report that she has a compression fx that is causing her left sided weakness and she will have surgery. Patient was given HEP with yellow theraputty to increase LUE strength. Pt will be safe to return home.    OT Assessment  Patient does not need any further OT services    Follow Up Recommendations  No OT follow up       Equipment Recommendations  None recommended by OT          Precautions / Restrictions Precautions Precautions: None   Pertinent Vitals/Pain No reports of pain.    ADL  Toilet Transfer: Performed;Independent Toilet Transfer Method: Other (comment) (ambulating) Toilet Transfer Equipment: Regular height toilet Transfers/Ambulation Related to ADLs: Patient transfers at an Independent level without AD.      Visit Information  Last OT Received On: 02/13/12 Assistance Needed: +1    Subjective Data  Subjective: "My left arm is getting better." Patient Stated Goal: To go home.   Prior Functioning  Vision/Perception  Home Living Lives With: Spouse Available Help at Discharge: Family Type of Home: House Home Access: Level entry Home Layout: One level Home Adaptive Equipment: None Prior Function Level of Independence: Independent Able to Take Stairs?: Yes Driving: No Vocation: Retired Musician: No difficulties Dominant Hand: Right      Cognition  Overall Cognitive Status: Appears within functional limits for tasks assessed/performed Arousal/Alertness: Awake/alert Orientation Level: Appears intact for tasks  assessed Behavior During Session: [facility] for tasks performed    Extremity/Trunk Assessment Right Upper Extremity Assessment RUE ROM/Strength/Tone: Within functional levels Left Upper Extremity Assessment LUE ROM/Strength/Tone: Deficits LUE ROM/Strength/Tone Deficits: A/ROM WFL in all ranges. MMT: 3/5. Due to large disc and spinal cord compression.   Mobility  Shoulder Instructions  Transfers Transfers: Sit to Stand;Stand to Sit Sit to Stand: 7: Independent;From bed;With armrests Stand to Sit: 7: Independent;To bed;With upper extremity assist       Exercise Other Exercises Other Exercises: Patient given HEP with yellow theraputty. Patient demonstrated exercises with verbal cues from therapist. Patient felt she could follow exercises at home independently.       End of Session OT - End of Session Activity Tolerance: Patient tolerated treatment well Patient left: Other (comment) (patient left in hallway taking a walk.)    Limmie Patricia, OTR/L 02/13/2012, 3:11 PM

## 2012-02-13 NOTE — Progress Notes (Signed)
[facility] PROGRESS NOTE  [name] ZOX:[id number] DOB: May 28, 1941 DOA: 02/11/2012 PCP: No primary provider on file.  Assessment/Plan: Principal Problem:  *Left-sided weakness Active Problems:  Slurred speech  HTN (hypertension)  Hypertensive encephalopathy  Cervical myelopathy  1. Dysarthria. Appears to have improved. Possibly secondary to hypertensive encephalopathy. Appreciate neurology input. We'll continue to adjust blood pressure medications. Continue current antiplatelet regimen. 2. Left-sided weakness secondary to cervical myelopathy. MRI brain is negative for acute infarct. She does have some cord compression on her MRI of the C-spine. Case was discussed with Dr. Danielle Dess by Dr. Karilyn Cota. She was started empirically on steroids and reports some clinical improvement. We will continue current treatment and discuss with Dr. Danielle Dess tomorrow.  Code Status: Full code Family Communication: Discussed with patient at the bedside Disposition Plan: Probable discharge home tomorrow she continues to improve, ambulate today   Brief narrative: This lady presents to the emergency room with dysarthria and progressive left-sided weakness. She reports her symptoms occurring for the past week. She was admitted to the hospital for further workup of possible stroke.  Consultants:  Neurology, Dr. Gerilyn Pilgrim  Neurosurgery, Dr. Danielle Dess via telephone  Procedures:  None  Antibiotics:  None  HPI/Subjective: Feeling better today, dysarthria has resolved. Left-sided weakness continues to improve  Objective: Filed Vitals:   02/12/12 1435 02/12/12 1821 02/12/12 2253 02/13/12 0546  BP: 169/84 152/94 137/78 161/85  Pulse: 85 73 74 69  Temp: 97.9 F (36.6 C) 98.2 F (36.8 C) 97.7 F (36.5 C) 97.2 F (36.2 C)  TempSrc: Oral Oral Oral Oral  Resp: 18 18 17 16   Height:      Weight:      SpO2: 100% 98% 98% 95%    Intake/Output Summary (Last 24 hours) at 02/13/12 1411 Last data  filed at 02/12/12 1700  Gross per 24 hour  Intake    240 ml  Output      0 ml  Net    240 ml   Filed Weights   02/11/12 1540 02/11/12 2145  Weight: 70.308 kg (155 lb) 70.4 kg (155 lb 3.3 oz)    Exam:   General:  No acute distress  Cardiovascular: S1, S2, regular rate and rhythm  Respiratory: Clear to auscultation bilaterally  Abdomen: Soft, nontender, nondistended, bowel sounds are active  Neurology: 5 out of 5 strength in the upper and lower extremities on the right, 4/5 in the upper and lower extremities on the left, cranial nerves appear to be grossly intact  Data Reviewed: Basic Metabolic Panel:  Lab 02/13/12 8119 02/11/12 1621  NA 142 142  K 3.9 3.9  CL 108 104  CO2 24 29  GLUCOSE 165* 102*  BUN 15 14  CREATININE 0.83 0.85  CALCIUM 9.4 9.1  MG -- --  PHOS -- --   Liver Function Tests:  Lab 02/11/12 1621  AST 14  ALT 8  ALKPHOS 57  BILITOT 0.2*  PROT 6.9  ALBUMIN 3.6   No results found for this basename: LIPASE:5,AMYLASE:5 in the last 168 hours No results found for this basename: AMMONIA:5 in the last 168 hours CBC:  Lab 02/11/12 1621  WBC 5.8  NEUTROABS 3.0  HGB 15.9*  HCT 48.2*  MCV 92.0  PLT 127*   Cardiac Enzymes:  Lab 02/11/12 1621  CKTOTAL --  CKMB --  CKMBINDEX --  TROPONINI <0.30   BNP (last 3 results) No results found for this basename: PROBNP:3 in the last 8760 hours CBG: No results found for this  basename: GLUCAP:5 in the last 168 hours  No results found for this or any previous visit (from the past 240 hour(s)).   Studies: Dg Chest 2 View  02/11/2012  *RADIOLOGY REPORT*  Clinical Data: Slurred speech.  Smoker.  CHEST - 2 VIEW  Comparison: 06/06/2007  Findings: Heart size and mediastinal contours appear normal.  No pleural effusion or edema identified.  No airspace consolidation noted.  There is mild spondylosis noted within the thoracic spine.  IMPRESSION:  1.  No acute cardiopulmonary abnormalities.   Original Report  Authenticated By: Rosealee Albee, M.D.    Ct Head Wo Contrast  02/11/2012  *RADIOLOGY REPORT*  Clinical Data: Headache, slurred speech, left-sided weakness, history of right-sided CVA  CT HEAD WITHOUT CONTRAST  Technique:  Contiguous axial images were obtained from the base of the skull through the vertex without contrast.  Comparison: None available  Findings: Scattered periventricular and basal ganglia hypodensities compatible with microvascular ischemic disease.  Basal ganglia calcifications.  No CT evidence of acute large territory infarct. No intraparenchymal or extra-axial mass or hemorrhage.  Normal size and configuration of the ventricles and basilar cisterns.  No midline shift.  Exuberant calcification within the midline of the cerebral falx.  Hyperostosis frontalis.  Limited visualization of the paranasal sinuses and mastoid air cells are normal.  Regional soft tissues are normal.  No displaced calvarial fracture.  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   Original Report Authenticated By: Waynard Reeds, M.D.    Mr [facility] Wo Contrast  02/12/2012  *RADIOLOGY REPORT*  Clinical Data:  4 days of headaches and left-sided weakness and slurred speech for 1 day without known injury.  Hypertension.  MRI BRAIN WITHOUT CONTRAST MRA HEAD WITHOUT CONTRAST  Technique: Multiplanar, multiecho pulse sequences of the brain and surrounding structures were obtained according to standard protocol without intravenous contrast.  Angiographic images of the head were obtained using MRA technique without contrast.  Comparison: 02/11/2012 CT.  No comparison MR.  MRI HEAD  Findings:  No acute infarct.  No intracranial hemorrhage.  Moderate small vessel disease type changes.  No hydrocephalus.  Major intracranial vascular structures are patent.  Hyperostosis frontalis interna.  Ossification of the falx incidentally noted.  Mild exophthalmos.  Partially empty sella.  Pineal  cyst with minimal impression upon the superior colliculus. This may be an incidental finding.  If the patient develops diplopia with upper gaze then this can be reevaluated to exclude growth.  Prominence of soft tissue in the posterior-superior nasopharynx may represent  complex Thornwaldt cysts.  Mucosal abnormality not excluded.  No secondary findings of eustachian tube dysfunction.  IMPRESSION: No acute infarct.  Moderate small vessel disease type changes.  No hydrocephalus.  Pineal cyst as noted above.  Prominence of soft tissue in the posterior-superior nasopharynx may represent  complex Thornwaldt cysts.  Mucosal abnormality not excluded.  No secondary findings of eustachian tube dysfunction.  MRA HEAD  Findings: Anterior circulation without medium or large size vessel significant stenosis or occlusion.  Scattered tiny bulges of the M1 segment of the middle cerebral artery bilaterally which may represent origin of vessels rather than aneurysms at the resolution of the present exam.  Left vertebral artery is dominant.  Mild to moderate narrowing of the distal right vertebral artery.  Moderate irregularity of the PICA bilaterally.  Nonvisualization AICA bilaterally.  Mild irregularity and narrowing involving portions of the superior cerebellar arteries and posterior cerebral arteries.  IMPRESSION: Atherosclerotic type changes right  vertebral artery and branch vessels as detailed above.   Original Report Authenticated By: Fuller Canada, M.D.    Mr Brain Wo Contrast  02/12/2012  *RADIOLOGY REPORT*  Clinical Data:  4 days of headaches and left-sided weakness and slurred speech for 1 day without known injury.  Hypertension.  MRI BRAIN WITHOUT CONTRAST MRA HEAD WITHOUT CONTRAST  Technique: Multiplanar, multiecho pulse sequences of the brain and surrounding structures were obtained according to standard protocol without intravenous contrast.  Angiographic images of the head were obtained using MRA technique  without contrast.  Comparison: 02/11/2012 CT.  No comparison MR.  MRI HEAD  Findings:  No acute infarct.  No intracranial hemorrhage.  Moderate small vessel disease type changes.  No hydrocephalus.  Major intracranial vascular structures are patent.  Hyperostosis frontalis interna.  Ossification of the falx incidentally noted.  Mild exophthalmos.  Partially empty sella.  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 This may be an incidental finding.  If the patient develops diplopia with upper gaze then this can be reevaluated to exclude growth.  Prominence of soft tissue in the posterior-superior nasopharynx may represent  complex Thornwaldt cysts.  Mucosal abnormality not excluded.  No secondary findings of eustachian tube dysfunction.  IMPRESSION: No acute infarct.  Moderate small vessel disease type changes.  No hydrocephalus.  Pineal cyst as noted above.  Prominence of soft tissue in the posterior-superior nasopharynx may represent  complex Thornwaldt cysts.  Mucosal abnormality not excluded.  No secondary findings of eustachian tube dysfunction.  MRA HEAD  Findings: Anterior circulation without medium or large size vessel significant stenosis or occlusion.  Scattered tiny bulges of the M1 segment of the middle cerebral artery bilaterally which may represent origin of vessels rather than aneurysms at the resolution of the present exam.  Left vertebral artery is dominant.  Mild to moderate narrowing of the distal right vertebral artery.  Moderate irregularity of the PICA bilaterally.  Nonvisualization AICA bilaterally.  Mild irregularity and narrowing involving portions of the superior cerebellar arteries and posterior cerebral arteries.  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   Original Report Authenticated By: Fuller Canada, M.D.    Mr Cervical Spine Wo Contrast  02/12/2012  *RADIOLOGY REPORT*  Clinical Data: Left arm  weakness  MRI CERVICAL SPINE WITHOUT CONTRAST  Technique:  Multiplanar and multiecho pulse sequences of the cervical spine, to include the craniocervical junction and cervicothoracic junction, were obtained according to standard protocol without intravenous contrast.  Comparison: None.  Findings: Normal cervical alignment.  Negative for fracture. Mild motion on the study degrades image quality.  C2-3:  Mild disc degeneration and disc bulging.  C3-4:  Small central disc protrusion and mild uncinate spurring with mild spinal stenosis.  C4-5:  Disc degeneration and mild spondylosis.  Mild facet degeneration.  C5-6:  Disc degeneration and mild spondylosis.  Mild spinal stenosis is present.  C6-7:  Ventral epidural   process extends from C5-6 through the upper aspect of T1.  This is causing some compression of the spinal cord but there is no cord edema.  This may represent extruded disc material from C6-7 and C7-T1.  On gradient echo imaging, there is some susceptibility on the margins of this process which could be epidural hematoma or a combination of disc and blood.  This could also represent calcification and CT may be helpful this regard.  C7-T1:  Ventral epidural process as described above.  This may be a  large extruded disc fragment compressing the cord.  There may be associated calcification or blood.  T1-2:  Negative  IMPRESSION:       Extensive ventral epidural process extending from C6-7       through C7-T1.  This is most likely extruded disc material       possibly associated ventral epidural hematoma or       calcification.  CT scanning is suggested to evaluate for       calcification.  There is some compression of the cord at C6-7       and C7-T1 without cord edema.        I discussed the findings by telephone with Dr. Karilyn Cota         Original Report Authenticated By: Camelia Phenes, M.D.     Scheduled Meds:    . amLODipine  10 mg Oral Daily  . aspirin  300 mg Rectal Daily   Or  . aspirin  325 mg  Oral Daily  . atorvastatin  80 mg Oral q1800  . clopidogrel  75 mg Oral Daily  . dexamethasone  10 mg Intravenous Q6H  . dexamethasone  4 mg Intravenous Q6H  . influenza  inactive virus vaccine  0.5 mL Intramuscular Tomorrow-1000  . lisinopril  10 mg Oral Daily  . pantoprazole  40 mg Oral Q1200  . varenicline  1 mg Oral Daily   Continuous Infusions:   Principal Problem:  *Left-sided weakness Active Problems:  Slurred speech  HTN (hypertension)  Hypertensive encephalopathy  Cervical myelopathy    Time spent: 30 minutes    MEMON,JEHANZEB  [facility] Pager [phone]. If 7PM-7AM, please contact night-coverage at [email or web address], password [facility] 02/13/2012, 2:11 PM  LOS: 2 days

## 2012-02-13 NOTE — Consult Note (Signed)
Reason for Consult: Referring Physician:   [name] is an 71 y.o. female.  HPI:   Past Medical History  Diagnosis Date  . Stroke   . Hypertension   . Seizures   . GERD (gastroesophageal reflux disease)     Past Surgical History  Procedure Date  . Abdominal hysterectomy     No family history on file.  Social History:  reports that she has been smoking.  She does not have any smokeless tobacco history on file. She reports that she does not drink alcohol or use illicit drugs.  Allergies:  Allergies  Allergen Reactions  . Penicillins Rash    Medications:  No current facility-administered medications on file prior to encounter.   Current Outpatient Prescriptions on File Prior to Encounter  Medication Sig Dispense Refill  . amLODipine (NORVASC) 10 MG tablet Take 10 mg by mouth daily.      . rosuvastatin (CRESTOR) 40 MG tablet Take 40 mg by mouth daily.        Scheduled Meds:   . amLODipine  10 mg Oral Daily  . aspirin  300 mg Rectal Daily   Or  . aspirin  325 mg Oral Daily  . atorvastatin  80 mg Oral q1800  . clopidogrel  75 mg Oral Daily  . dexamethasone  10 mg Intravenous Q6H  . dexamethasone  4 mg Intravenous Q6H  . influenza  inactive virus vaccine  0.5 mL Intramuscular Tomorrow-1000  . lisinopril  10 mg Oral Daily  . pantoprazole  40 mg Oral Q1200  . varenicline  1 mg Oral Daily  . DISCONTD: dexamethasone  10 mg Intravenous Q6H   Continuous Infusions:  PRN Meds:.   Results for orders placed during the hospital encounter of 02/11/12 (from the past 48 hour(s))  URINALYSIS, ROUTINE W REFLEX MICROSCOPIC     Status: Normal   Collection Time   02/11/12  4:00 PM      Component Value Range Comment   Color, Urine YELLOW  YELLOW    APPearance CLEAR  CLEAR    Specific Gravity, Urine 1.010  1.005 - 1.030    pH 7.0  5.0 - 8.0    Glucose, UA NEGATIVE  NEGATIVE mg/dL    Hgb urine dipstick NEGATIVE  NEGATIVE    Bilirubin Urine NEGATIVE  NEGATIVE    Ketones,  ur NEGATIVE  NEGATIVE mg/dL    Protein, ur NEGATIVE  NEGATIVE mg/dL    Urobilinogen, UA 0.2  0.0 - 1.0 mg/dL    Nitrite NEGATIVE  NEGATIVE    Leukocytes, UA NEGATIVE  NEGATIVE MICROSCOPIC NOT DONE ON URINES WITH NEGATIVE PROTEIN, BLOOD, LEUKOCYTES, NITRITE, OR GLUCOSE <1000 mg/dL.  URINE RAPID DRUG SCREEN (HOSP PERFORMED)     Status: Normal   Collection Time   02/11/12  4:00 PM      Component Value Range Comment   Opiates NONE DETECTED  NONE DETECTED    Cocaine NONE DETECTED  NONE DETECTED    Benzodiazepines NONE DETECTED  NONE DETECTED    Amphetamines NONE DETECTED  NONE DETECTED    Tetrahydrocannabinol NONE DETECTED  NONE DETECTED    Barbiturates NONE DETECTED  NONE DETECTED   CBC WITH DIFFERENTIAL     Status: Abnormal   Collection Time   02/11/12  4:21 PM      Component Value Range Comment   WBC 5.8  4.0 - 10.5 K/uL    RBC 5.24 (*) 3.87 - 5.11 MIL/uL    Hemoglobin 15.9 (*) 12.0 -  15.0 g/dL    HCT 40.9 (*) 81.1 - 46.0 %    MCV 92.0  78.0 - 100.0 fL    MCH 30.3  26.0 - 34.0 pg    MCHC 33.0  30.0 - 36.0 g/dL    RDW 91.4  78.2 - 95.6 %    Platelets 127 (*) 150 - 400 K/uL    Neutrophils Relative 52  43 - 77 %    Neutro Abs 3.0  1.7 - 7.7 K/uL    Lymphocytes Relative 40  12 - 46 %    Lymphs Abs 2.3  0.7 - 4.0 K/uL    Monocytes Relative 5  3 - 12 %    Monocytes Absolute 0.3  0.1 - 1.0 K/uL    Eosinophils Relative 2  0 - 5 %    Eosinophils Absolute 0.1  0.0 - 0.7 K/uL    Basophils Relative 0  0 - 1 %    Basophils Absolute 0.0  0.0 - 0.1 K/uL   COMPREHENSIVE METABOLIC PANEL     Status: Abnormal   Collection Time   02/11/12  4:21 PM      Component Value Range Comment   Sodium 142  135 - 145 mEq/L    Potassium 3.9  3.5 - 5.1 mEq/L    Chloride 104  96 - 112 mEq/L    CO2 29  19 - 32 mEq/L    Glucose, Bld 102 (*) 70 - 99 mg/dL    BUN 14  6 - 23 mg/dL    Creatinine, Ser 2.13  0.50 - 1.10 mg/dL    Calcium 9.1  8.4 - 08.6 mg/dL    Total Protein 6.9  6.0 - 8.3 g/dL    Albumin 3.6   3.5 - 5.2 g/dL    AST 14  0 - 37 U/L    ALT 8  0 - 35 U/L    Alkaline Phosphatase 57  39 - 117 U/L    Total Bilirubin 0.2 (*) 0.3 - 1.2 mg/dL    GFR calc non Af Amer 68 (*) >90 mL/min    GFR calc Af Amer 79 (*) >90 mL/min   ETHANOL     Status: Normal   Collection Time   02/11/12  4:21 PM      Component Value Range Comment   Alcohol, Ethyl (B) <11  0 - 11 mg/dL   TROPONIN I     Status: Normal   Collection Time   02/11/12  4:21 PM      Component Value Range Comment   Troponin I <0.30  <0.30 ng/mL   HEMOGLOBIN A1C     Status: Abnormal   Collection Time   02/12/12  4:36 AM      Component Value Range Comment   Hemoglobin A1C 5.7 (*) <5.7 %    Mean Plasma Glucose 117 (*) <117 mg/dL   LIPID PANEL     Status: Normal   Collection Time   02/12/12  4:36 AM      Component Value Range Comment   Cholesterol 152  0 - 200 mg/dL    Triglycerides 68  <578 mg/dL    HDL 77  >46 mg/dL    Total CHOL/HDL Ratio 2.0      VLDL 14  0 - 40 mg/dL    LDL Cholesterol 61  0 - 99 mg/dL   BASIC METABOLIC PANEL     Status: Abnormal   Collection Time   02/13/12  5:06 AM  Component Value Range Comment   Sodium 142  135 - 145 mEq/L    Potassium 3.9  3.5 - 5.1 mEq/L    Chloride 108  96 - 112 mEq/L    CO2 24  19 - 32 mEq/L    Glucose, Bld 165 (*) 70 - 99 mg/dL    BUN 15  6 - 23 mg/dL    Creatinine, Ser 1.61  0.50 - 1.10 mg/dL    Calcium 9.4  8.4 - 09.6 mg/dL    GFR calc non Af Amer 70 (*) >90 mL/min    GFR calc Af Amer 82 (*) >90 mL/min     Dg Chest 2 View  02/11/2012  *RADIOLOGY REPORT*  Clinical Data: Slurred speech.  Smoker.  CHEST - 2 VIEW  Comparison: 06/06/2007  Findings: Heart size and mediastinal contours appear normal.  No pleural effusion or edema identified.  No airspace consolidation noted.  There is mild spondylosis noted within the thoracic spine.  IMPRESSION:  1.  No acute cardiopulmonary abnormalities.   Original Report Authenticated By: Rosealee Albee, M.D.    Ct Head Wo  Contrast  02/11/2012  *RADIOLOGY REPORT*  Clinical Data: Headache, slurred speech, left-sided weakness, history of right-sided CVA  CT HEAD WITHOUT CONTRAST  Technique:  Contiguous axial images were obtained from the base of the skull through the vertex without contrast.  Comparison: None available  Findings: Scattered periventricular and basal ganglia hypodensities compatible with microvascular ischemic disease.  Basal ganglia calcifications.  No CT evidence of acute large territory infarct. No intraparenchymal or extra-axial mass or hemorrhage.  Normal size and configuration of the ventricles and basilar cisterns.  No midline shift.  Exuberant calcification within the midline of the cerebral falx.  Hyperostosis frontalis.  Limited visualization of the paranasal sinuses and mastoid air cells are normal.  Regional soft tissues are normal.  No displaced calvarial fracture.  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   Original Report Authenticated By: Waynard Reeds, M.D.    Mr [facility] Wo Contrast  02/12/2012  *RADIOLOGY REPORT*  Clinical Data:  4 days of headaches and left-sided weakness and slurred speech for 1 day without known injury.  Hypertension.  MRI BRAIN WITHOUT CONTRAST MRA HEAD WITHOUT CONTRAST  Technique: Multiplanar, multiecho pulse sequences of the brain and surrounding structures were obtained according to standard protocol without intravenous contrast.  Angiographic images of the head were obtained using MRA technique without contrast.  Comparison: 02/11/2012 CT.  No comparison MR.  MRI HEAD  Findings:  No acute infarct.  No intracranial hemorrhage.  Moderate small vessel disease type changes.  No hydrocephalus.  Major intracranial vascular structures are patent.  Hyperostosis frontalis interna.  Ossification of the falx incidentally noted.  Mild exophthalmos.  Partially empty sella.  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  This may be an incidental finding.  If the patient develops diplopia with upper gaze then this can be reevaluated to exclude growth.  Prominence of soft tissue in the posterior-superior nasopharynx may represent  complex Thornwaldt cysts.  Mucosal abnormality not excluded.  No secondary findings of eustachian tube dysfunction.  IMPRESSION: No acute infarct.  Moderate small vessel disease type changes.  No hydrocephalus.  Pineal cyst as noted above.  Prominence of soft tissue in the posterior-superior nasopharynx may represent  complex Thornwaldt cysts.  Mucosal abnormality not excluded.  No secondary findings of eustachian tube dysfunction.  MRA HEAD  Findings: Anterior circulation without medium or  large size vessel significant stenosis or occlusion.  Scattered tiny bulges of the M1 segment of the middle cerebral artery bilaterally which may represent origin of vessels rather than aneurysms at the resolution of the present exam.  Left vertebral artery is dominant.  Mild to moderate narrowing of the distal right vertebral artery.  Moderate irregularity of the PICA bilaterally.  Nonvisualization AICA bilaterally.  Mild irregularity and narrowing involving portions of the superior cerebellar arteries and posterior cerebral arteries.  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   Original Report Authenticated By: Fuller Canada, M.D.    Mr Brain Wo Contrast  02/12/2012  *RADIOLOGY REPORT*  Clinical Data:  4 days of headaches and left-sided weakness and slurred speech for 1 day without known injury.  Hypertension.  MRI BRAIN WITHOUT CONTRAST MRA HEAD WITHOUT CONTRAST  Technique: Multiplanar, multiecho pulse sequences of the brain and surrounding structures were obtained according to standard protocol without intravenous contrast.  Angiographic images of the head were obtained using MRA technique without contrast.  Comparison: 02/11/2012 CT.  No comparison MR.   MRI HEAD  Findings:  No acute infarct.  No intracranial hemorrhage.  Moderate small vessel disease type changes.  No hydrocephalus.  Major intracranial vascular structures are patent.  Hyperostosis frontalis interna.  Ossification of the falx incidentally noted.  Mild exophthalmos.  Partially empty sella.  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 This may be an incidental finding.  If the patient develops diplopia with upper gaze then this can be reevaluated to exclude growth.  Prominence of soft tissue in the posterior-superior nasopharynx may represent  complex Thornwaldt cysts.  Mucosal abnormality not excluded.  No secondary findings of eustachian tube dysfunction.  IMPRESSION: No acute infarct.  Moderate small vessel disease type changes.  No hydrocephalus.  Pineal cyst as noted above.  Prominence of soft tissue in the posterior-superior nasopharynx may represent  complex Thornwaldt cysts.  Mucosal abnormality not excluded.  No secondary findings of eustachian tube dysfunction.  MRA HEAD  Findings: Anterior circulation without medium or large size vessel significant stenosis or occlusion.  Scattered tiny bulges of the M1 segment of the middle cerebral artery bilaterally which may represent origin of vessels rather than aneurysms at the resolution of the present exam.  Left vertebral artery is dominant.  Mild to moderate narrowing of the distal right vertebral artery.  Moderate irregularity of the PICA bilaterally.  Nonvisualization AICA bilaterally.  Mild irregularity and narrowing involving portions of the superior cerebellar arteries and posterior cerebral arteries.  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   Original Report Authenticated By: Fuller Canada, M.D.    Mr Cervical Spine Wo Contrast  02/12/2012  *RADIOLOGY REPORT*  Clinical Data: Left arm weakness  MRI CERVICAL SPINE WITHOUT CONTRAST  Technique:   Multiplanar and multiecho pulse sequences of the cervical spine, to include the craniocervical junction and cervicothoracic junction, were obtained according to standard protocol without intravenous contrast.  Comparison: None.  Findings: Normal cervical alignment.  Negative for fracture. Mild motion on the study degrades image quality.  C2-3:  Mild disc degeneration and disc bulging.  C3-4:  Small central disc protrusion and mild uncinate spurring with mild spinal stenosis.  C4-5:  Disc degeneration and mild spondylosis.  Mild facet degeneration.  C5-6:  Disc degeneration and mild spondylosis.  Mild spinal stenosis is present.  C6-7:  Ventral epidural   process extends from C5-6  through the upper aspect of T1.  This is causing some compression of the spinal cord but there is no cord edema.  This may represent extruded disc material from C6-7 and C7-T1.  On gradient echo imaging, there is some susceptibility on the margins of this process which could be epidural hematoma or a combination of disc and blood.  This could also represent calcification and CT may be helpful this regard.  C7-T1:  Ventral epidural process as described above.  This may be a large extruded disc fragment compressing the cord.  There may be associated calcification or blood.  T1-2:  Negative  IMPRESSION:       Extensive ventral epidural process extending from C6-7       through C7-T1.  This is most likely extruded disc material       possibly associated ventral epidural hematoma or       calcification.  CT scanning is suggested to evaluate for       calcification.  There is some compression of the cord at C6-7       and C7-T1 without cord edema.        I discussed the findings by telephone with Dr. Karilyn Cota         Original Report Authenticated By: Camelia Phenes, M.D.     Review of Systems  Constitutional: Negative.   Eyes: Negative.   Respiratory: Negative.   Genitourinary: Negative.   Musculoskeletal: Negative.   Skin: Negative.    Neurological: Positive for headaches.   Blood pressure 161/85, pulse 69, temperature 97.2 F (36.2 C), temperature source Oral, resp. rate 16, height 5\' 2"  (1.575 m), weight 70.4 kg (155 lb 3.3 oz), SpO2 95.00%. Physical Exam  Assessment/Plan: Also see dictation and orders.   Kanda Deluna 02/13/2012, 7:49 AM

## 2012-02-13 NOTE — Consult Note (Signed)
[facility] [name] A. Gerilyn Pilgrim, MD     [email or web address]         SUBJECTIVE: The patient is a 71 year old ambidextrous black female who reports having significant headaches on the day that she presented to the hospital. She actually was walking around which she took does when the headache got worse. The patient was told by her husband that she also had significant slurring of her speech. The patient decided to see her primary care provider Dr. Lysbeth Galas and her blood pressure was quite elevated. She subsequently sent to the hospital for further evaluation. The patient's headaches and slurred speech occurred it lasted for that day. She reports to me however that she has been having left-sided weakness with a past couple of weeks. The left-sided weakness probably also got worse during the time that she developed the headaches and slurring of her speech. The slurring of her speech and headaches have resolved but she continues to have the left-sided weakness. She had a MRI of the brain which showed chronic changes but nothing acute. Cervical spine MRI was obtained showing a large disc and spinal cord compression. She has been started on steroids. A consult to Dr. Danielle Dess was obtained. The patient relates to me that she had some other type of neurological event 2 years ago resulted in some symptoms involving the right lower extremity and right upper extremity.  OBJECTIVE: GENERAL: This is a very pleasant thin lady who is in no acute distress.  HEENT: Retro-palatal space fine. Neck is supple. Head is normocephalic and atraumatic.  ABDOMEN: soft  EXTREMITIES: No edema   BACK: unremarkable.  SKIN: Normal by inspection.    MENTAL STATUS: Alert and oriented. Speech, language and cognition are generally intact. Judgment and insight normal.   CRANIAL NERVES: Pupils are equal, round and reactive to light and accomodation; extra ocular movements are full, there is no significant nystagmus; visual  fields are full; upper and lower facial muscles are normal in strength and symmetric, there is no flattening of the nasolabial folds; tongue is midline; uvula is midline; shoulder elevation is normal.  MOTOR: the patient has proximal leg weakness bilaterally graded as 4+/5. Distal strength is normal at 5/5. Tone and bulk are also normal. The upper extremities show normal tone, bulk and strength. Interestingly, she has mild downward drift of left upper extremity.  COORDINATION: Left finger to nose is normal, right finger to nose is normal, No rest tremor; no intention tremor; no postural tremor; no bradykinesia.  REFLEXES: Deep tendon reflexes are symmetrical and normal. Babinski reflexes are flexor bilaterally.   SENSATION: Normal to light touch, temperature, and pinprick.  GAIT: She limps on the left side somewhat. She is able to take good strides without assisted devices however.  The patient's cervical spine MRI is reviewed in person. There is a large disc extrusion at C6-T1which compresses the spinal cord. There is however no intrinsic cord edema.  ASSESSMENT/PLAN:  1. I suspect that the patient's initial presentation with headache, dysarthria and worsening left-sided weakness was due to hypertensive encephalopathy and not an acute infarct. Blood pressure control is therefore advised. Antiplatelet agent is also suggested. Aspirin should be sufficient. 2. 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 Steroids have been started. I think that she would likely need to have this removed. Neurosurgical consultation and evaluation is suggested on an outpatient basis.  Beryle Beams, MD Diplomate, American Board of Psychiatry and Neurology (Neurology).

## 2012-02-14 MED ORDER — LISINOPRIL 20 MG PO TABS: 20.0000 mg | ORAL_TABLET | Freq: Every day | ORAL | Status: DC

## 2012-02-14 MED ORDER — ASPIRIN 325 MG PO TABS: 325.0000 mg | ORAL_TABLET | Freq: Every day | ORAL | Status: DC

## 2012-02-14 MED ORDER — PREDNISONE (PAK) 10 MG PO TABS: ORAL_TABLET | ORAL | Status: DC

## 2012-02-14 NOTE — Discharge Summary (Signed)
Physician Discharge Summary  [name]:[id number] DOB: [birth year] DOA: 02/11/2012  PCP: Tabitha Hector, MD  Admit date: 02/11/2012 Discharge date: 02/14/2012  Recommendations for Outpatient Follow-up:  1. Follow up with Dr. Danielle Dess in 1 week, patient will be contacted with appointment 2. Follow up with primary care doctor in 2 weeks  Discharge Diagnoses:  Principal Problem:  *Left-sided weakness Active Problems:  Slurred speech  HTN (hypertension)  Hypertensive encephalopathy  Cervical myelopathy   Discharge Condition: improved  Diet recommendation: low salt  Filed Weights   02/11/12 1540 02/11/12 2145  Weight: 70.308 kg (155 lb) 70.4 kg (155 lb 3.3 oz)    History of present illness:  71 yo female 4 days of left sided weakness and one day of slurred speech. Symptoms have almost completely resolved. No n/v/d/fevers. No headache. No blurred vision. No problem with gait.  No cp or sob. No rashes.   Hospital Course:  This lady was admitted with suspected stroke symptoms.  She underwent MRI of the brain that was negative for acute infarct.  Further investigations with MRI of C spine revealed cord compression at C6-C7, C7-T1.  This was likely causing her left sided weakness.  Case was discussed with Dr. Danielle Dess who recommended steroids.  Since the patient was placed on decadron, her symptoms have significantly improved.  Her weakness has near resolved.  She will be set up to see Dr. Danielle Dess in the outpatient setting in the next week.  Regarding her slurred speech, it was felt that this was likely secondary to a hypertensive encephalopathy.  She was seen by Dr. Gerilyn Pilgrim and underwent the remainder of the stroke work up, which was found to be unremarkable.  Antiplatelet therapy with aspirin was recommended.    Patient has significantly improved and will be discharged home today.  Procedures:  none  Consultations:  Neurology, Dr. Gerilyn Pilgrim  Neurosurgery, Dr. Danielle Dess  via telephone  Discharge Exam: Filed Vitals:   02/13/12 0546 02/13/12 1500 02/13/12 2056 02/14/12 0450  BP: 161/85 159/82 126/72 165/96  Pulse: 69 82 78 72  Temp: 97.2 F (36.2 C) 98.7 F (37.1 C) 98.7 F (37.1 C) 98 F (36.7 C)  TempSrc: Oral  Oral Oral  Resp: 16 16 18 18   Height:      Weight:      SpO2: 95% 97% 100% 98%    General: NAD Cardiovascular: s1, s2, rrr Respiratory: cta b  Discharge Instructions  Discharge Orders    Future Orders Please Complete By Expires   Ambulatory referral to Physical Therapy      Diet - low sodium heart healthy      Increase activity slowly      Call MD for:      Comments:   Worsening weakness on either side, slurring of speech, confusion or any other complaints       Medication List     As of 02/14/2012 11:03 AM    STOP taking these medications         clopidogrel 75 MG tablet   Commonly known as: PLAVIX      PATADAY 0.2 % Soln   Generic drug: Olopatadine HCl      TAKE these medications         amLODipine 10 MG tablet   Commonly known as: NORVASC   Take 10 mg by mouth daily.      aspirin 325 MG tablet   Take 1 tablet (325 mg total) by mouth daily.  lisinopril 20 MG tablet   Commonly known as: PRINIVIL,ZESTRIL   Take 1 tablet (20 mg total) by mouth daily.      predniSONE 10 MG tablet   Commonly known as: STERAPRED UNI-PAK   Take 40mg  po daily for 3 days, then 30mg  po daily for 3 days then 20mg  po daily for 3 days then 10mg  po daily for 3 days then stop      rosuvastatin 40 MG tablet   Commonly known as: CRESTOR   Take 40 mg by mouth daily.      varenicline 1 MG tablet   Commonly known as: CHANTIX   Take 1 mg by mouth daily.           Follow-up Information    Follow up with Tabitha Dama, MD. (they will call you with an appointment for next week)    Contact information:   [address] [place] [address] [phone]       Follow up with Tabitha Hector, MD. In 2 weeks.    Contact information:   [address] [facility] [address] [phone]           The results of significant diagnostics from this hospitalization (including imaging, microbiology, ancillary and laboratory) are listed below for reference.    Significant Diagnostic Studies: Dg Chest 2 View  02/11/2012  *RADIOLOGY REPORT*  Clinical Data: Slurred speech.  Smoker.  CHEST - 2 VIEW  Comparison: 06/06/2007  Findings: Heart size and mediastinal contours appear normal.  No pleural effusion or edema identified.  No airspace consolidation noted.  There is mild spondylosis noted within the thoracic spine.  IMPRESSION:  1.  No acute cardiopulmonary abnormalities.   Original Report Authenticated By: Rosealee Albee, M.D.    Ct Head Wo Contrast  02/11/2012  *RADIOLOGY REPORT*  Clinical Data: Headache, slurred speech, left-sided weakness, history of right-sided CVA  CT HEAD WITHOUT CONTRAST  Technique:  Contiguous axial images were obtained from the base of the skull through the vertex without contrast.  Comparison: None available  Findings: Scattered periventricular and basal ganglia hypodensities compatible with microvascular ischemic disease.  Basal ganglia calcifications.  No CT evidence of acute large territory infarct. No intraparenchymal or extra-axial mass or hemorrhage.  Normal size and configuration of the ventricles and basilar cisterns.  No midline shift.  Exuberant calcification within the midline of the cerebral falx.  Hyperostosis frontalis.  Limited visualization of the paranasal sinuses and mastoid air cells are normal.  Regional soft tissues are normal.  No displaced calvarial fracture.  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   Original Report Authenticated By: Waynard Reeds, M.D.    Mr [facility] Wo Contrast  02/12/2012  *RADIOLOGY REPORT*  Clinical Data:  4 days of headaches and left-sided weakness and slurred speech for 1 day without known  injury.  Hypertension.  MRI BRAIN WITHOUT CONTRAST MRA HEAD WITHOUT CONTRAST  Technique: Multiplanar, multiecho pulse sequences of the brain and surrounding structures were obtained according to standard protocol without intravenous contrast.  Angiographic images of the head were obtained using MRA technique without contrast.  Comparison: 02/11/2012 CT.  No comparison MR.  MRI HEAD  Findings:  No acute infarct.  No intracranial hemorrhage.  Moderate small vessel disease type changes.  No hydrocephalus.  Major intracranial vascular structures are patent.  Hyperostosis frontalis interna.  Ossification of the falx incidentally noted.  Mild exophthalmos.  Partially empty sella.  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 This may be an incidental finding.  If the patient develops diplopia with upper gaze then this can be reevaluated to exclude growth.  Prominence of soft tissue in the posterior-superior nasopharynx may represent  complex Thornwaldt cysts.  Mucosal abnormality not excluded.  No secondary findings of eustachian tube dysfunction.  IMPRESSION: No acute infarct.  Moderate small vessel disease type changes.  No hydrocephalus.  Pineal cyst as noted above.  Prominence of soft tissue in the posterior-superior nasopharynx may represent  complex Thornwaldt cysts.  Mucosal abnormality not excluded.  No secondary findings of eustachian tube dysfunction.  MRA HEAD  Findings: Anterior circulation without medium or large size vessel significant stenosis or occlusion.  Scattered tiny bulges of the M1 segment of the middle cerebral artery bilaterally which may represent origin of vessels rather than aneurysms at the resolution of the present exam.  Left vertebral artery is dominant.  Mild to moderate narrowing of the distal right vertebral artery.  Moderate irregularity of the PICA bilaterally.  Nonvisualization AICA bilaterally.  Mild irregularity and narrowing involving portions of the superior  cerebellar arteries and posterior cerebral arteries.  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   Original Report Authenticated By: Fuller Canada, M.D.    Mr Brain Wo Contrast  02/12/2012  *RADIOLOGY REPORT*  Clinical Data:  4 days of headaches and left-sided weakness and slurred speech for 1 day without known injury.  Hypertension.  MRI BRAIN WITHOUT CONTRAST MRA HEAD WITHOUT CONTRAST  Technique: Multiplanar, multiecho pulse sequences of the brain and surrounding structures were obtained according to standard protocol without intravenous contrast.  Angiographic images of the head were obtained using MRA technique without contrast.  Comparison: 02/11/2012 CT.  No comparison MR.  MRI HEAD  Findings:  No acute infarct.  No intracranial hemorrhage.  Moderate small vessel disease type changes.  No hydrocephalus.  Major intracranial vascular structures are patent.  Hyperostosis frontalis interna.  Ossification of the falx incidentally noted.  Mild exophthalmos.  Partially empty sella.  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 This may be an incidental finding.  If the patient develops diplopia with upper gaze then this can be reevaluated to exclude growth.  Prominence of soft tissue in the posterior-superior nasopharynx may represent  complex Thornwaldt cysts.  Mucosal abnormality not excluded.  No secondary findings of eustachian tube dysfunction.  IMPRESSION: No acute infarct.  Moderate small vessel disease type changes.  No hydrocephalus.  Pineal cyst as noted above.  Prominence of soft tissue in the posterior-superior nasopharynx may represent  complex Thornwaldt cysts.  Mucosal abnormality not excluded.  No secondary findings of eustachian tube dysfunction.  MRA HEAD  Findings: Anterior circulation without medium or large size vessel significant stenosis or occlusion.  Scattered tiny bulges of the M1 segment of the middle  cerebral artery bilaterally which may represent origin of vessels rather than aneurysms at the resolution of the present exam.  Left vertebral artery is dominant.  Mild to moderate narrowing of the distal right vertebral artery.  Moderate irregularity of the PICA bilaterally.  Nonvisualization AICA bilaterally.  Mild irregularity and narrowing involving portions of the superior cerebellar arteries and posterior cerebral arteries.  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   Original Report Authenticated By: Fuller Canada, M.D.    Mr Cervical Spine Wo Contrast  02/12/2012  *RADIOLOGY REPORT*  Clinical Data: Left arm weakness  MRI  CERVICAL SPINE WITHOUT CONTRAST  Technique:  Multiplanar and multiecho pulse sequences of the cervical spine, to include the craniocervical junction and cervicothoracic junction, were obtained according to standard protocol without intravenous contrast.  Comparison: None.  Findings: Normal cervical alignment.  Negative for fracture. Mild motion on the study degrades image quality.  C2-3:  Mild disc degeneration and disc bulging.  C3-4:  Small central disc protrusion and mild uncinate spurring with mild spinal stenosis.  C4-5:  Disc degeneration and mild spondylosis.  Mild facet degeneration.  C5-6:  Disc degeneration and mild spondylosis.  Mild spinal stenosis is present.  C6-7:  Ventral epidural   process extends from C5-6 through the upper aspect of T1.  This is causing some compression of the spinal cord but there is no cord edema.  This may represent extruded disc material from C6-7 and C7-T1.  On gradient echo imaging, there is some susceptibility on the margins of this process which could be epidural hematoma or a combination of disc and blood.  This could also represent calcification and CT may be helpful this regard.  C7-T1:  Ventral epidural process as described above.  This may be a large extruded disc fragment  compressing the cord.  There may be associated calcification or blood.  T1-2:  Negative  IMPRESSION:       Extensive ventral epidural process extending from C6-7       through C7-T1.  This is most likely extruded disc material       possibly associated ventral epidural hematoma or       calcification.  CT scanning is suggested to evaluate for       calcification.  There is some compression of the cord at C6-7       and C7-T1 without cord edema.        I discussed the findings by telephone with Dr. Karilyn Cota         Original Report Authenticated By: Camelia Phenes, M.D.    US Carotid Duplex Bilateral  02/14/2012  *RADIOLOGY REPORT*  Clinical Data: Stroke, hypertension, tobacco use.  BILATERAL CAROTID DUPLEX ULTRASOUND  Technique: Wallace Cullens scale imaging, color Doppler and duplex ultrasound was performed of bilateral carotid and vertebral arteries in the neck.  Comparison: 06/08/2007  Criteria:  Quantification of carotid stenosis is based on velocity parameters that correlate the residual internal carotid diameter with NASCET-based stenosis levels, using the diameter of the distal internal carotid lumen as the denominator for stenosis measurement.  The following velocity measurements were obtained:                   PEAK SYSTOLIC/END DIASTOLIC RIGHT ICA:                        74/12cm/sec CCA:                        175/11cm/sec SYSTOLIC ICA/CCA RATIO:     0.42 DIASTOLIC ICA/CCA RATIO:    1.03 ECA:                        156cm/sec  LEFT ICA:                        77/21cm/sec CCA:                        150/15cm/sec SYSTOLIC ICA/CCA RATIO:  0.63 DIASTOLIC ICA/CCA RATIO:    0.67 ECA:                        109cm/sec  Findings:  RIGHT CAROTID ARTERY: There is intimal thickening through the common carotid artery. Eccentric minimally calcified   plaque in the carotid bulb extending through the mid ICA without high-grade stenosis.  Normal wave forms and color Doppler signal.  There is some tortuosity of the ICA.  RIGHT  VERTEBRAL ARTERY:  Normal flow direction and waveform.  LEFT CAROTID ARTERY: Intimal thickening in the common carotid artery.  No significant plaque accumulation or stenosis.  Normal wave forms and color Doppler signal.  LEFT VERTEBRAL ARTERY:  Normal flow direction and waveform.  IMPRESSION: Right carotid bifurcation and ICA plaque resulting in less than 50% diameter stenosis. The exam does not exclude plaque ulceration or embolization.  Continued surveillance recommended.   Original Report Authenticated By: Osa Craver, M.D.     Microbiology: No results found for this or any previous visit (from the past 240 hour(s)).   Labs: Basic Metabolic Panel:  Lab 02/13/12 4098 02/11/12 1621  NA 142 142  K 3.9 3.9  CL 108 104  CO2 24 29  GLUCOSE 165* 102*  BUN 15 14  CREATININE 0.83 0.85  CALCIUM 9.4 9.1  MG -- --  PHOS -- --   Liver Function Tests:  Lab 02/11/12 1621  AST 14  ALT 8  ALKPHOS 57  BILITOT 0.2*  PROT 6.9  ALBUMIN 3.6   No results found for this basename: LIPASE:5,AMYLASE:5 in the last 168 hours No results found for this basename: AMMONIA:5 in the last 168 hours CBC:  Lab 02/11/12 1621  WBC 5.8  NEUTROABS 3.0  HGB 15.9*  HCT 48.2*  MCV 92.0  PLT 127*   Cardiac Enzymes:  Lab 02/11/12 1621  CKTOTAL --  CKMB --  CKMBINDEX --  TROPONINI <0.30   BNP: BNP (last 3 results) No results found for this basename: PROBNP:3 in the last 8760 hours CBG: No results found for this basename: GLUCAP:5 in the last 168 hours  Time coordinating discharge: greater than 30 minutes  Signed:  Graysen Woodyard  [facility] 02/14/2012, 11:03 AM

## 2012-02-25 ENCOUNTER — Other Ambulatory Visit: Payer: Self-pay | Admitting: Neurological Surgery

## 2012-02-26 ENCOUNTER — Encounter (HOSPITAL_COMMUNITY): Payer: Self-pay | Admitting: Pharmacist

## 2012-02-29 ENCOUNTER — Other Ambulatory Visit (HOSPITAL_COMMUNITY): Payer: Medicare Other

## 2012-03-03 ENCOUNTER — Encounter (HOSPITAL_COMMUNITY): Payer: Self-pay

## 2012-03-03 ENCOUNTER — Encounter (HOSPITAL_COMMUNITY)
Admission: RE | Admit: 2012-03-03 | Discharge: 2012-03-03 | Disposition: A | Discharge status: Home / Self Care | Payer: Medicare Other | Source: Ambulatory Visit | Attending: Neurological Surgery | Admitting: Neurological Surgery

## 2012-03-03 HISTORY — DX: Headache: R51

## 2012-03-03 HISTORY — DX: Unspecified osteoarthritis, unspecified site: M19.90

## 2012-03-03 LAB — SURGICAL PCR SCREEN
MRSA, PCR: NEGATIVE
Staphylococcus aureus: NEGATIVE

## 2012-03-03 LAB — BASIC METABOLIC PANEL
BUN: 7 mg/dL (ref 6–23)
CO2: 28 mEq/L (ref 19–32)
Calcium: 9.1 mg/dL (ref 8.4–10.5)
Creatinine, Ser: 0.8 mg/dL (ref 0.50–1.10)
GFR calc non Af Amer: 74 mL/min — ABNORMAL LOW (ref >90)
Glucose, Bld: 99 mg/dL (ref 70–99)

## 2012-03-03 LAB — TYPE AND SCREEN: ABO/RH(D): B POS

## 2012-03-03 LAB — CBC
MCH: 29.7 pg (ref 26.0–34.0)
MCHC: 31.7 g/dL (ref 30.0–36.0)
MCV: 93.8 fL (ref 78.0–100.0)
Platelets: 125 10*3/uL — ABNORMAL LOW (ref 150–400)
RDW: 14.2 % (ref 11.5–15.5)

## 2012-03-03 LAB — ABO/RH: ABO/RH(D): B POS

## 2012-03-03 NOTE — Pre-Procedure Instructions (Signed)
20 [name]  03/03/2012   Your procedure is scheduled on: Friday  03/07/12   Report to [facility] at 900 AM.  Call this number if you have problems the morning of surgery: [phone]   Remember:   Do not eat food OR DRINK After Midnight.    Take these medicines the morning of surgery with A SIP OF WATER: PRILOSEC (STOP ASPIRIN, COUMADIN, PLAVIX, EFFIENT, HERBAL MEDICINES)   Do not wear jewelry, make-up or nail polish.  Do not wear lotions, powders, or perfumes. You may wear deodorant.  Do not shave 48 hours prior to surgery. Men may shave face and neck.  Do not bring valuables to the hospital.  Contacts, dentures or bridgework may not be worn into surgery.  Leave suitcase in the car. After surgery it may be brought to your room.  For patients admitted to the hospital, checkout time is 11:00 AM the day of discharge.   Patients discharged the day of surgery will not be allowed to drive home.  Name and phone number of your driver:   Special Instructions: Shower using CHG 2 nights before surgery and the night before surgery.  If you shower the day of surgery use CHG.  Use special wash - you have one bottle of CHG for all showers.  You should use approximately 1/3 of the bottle for each shower.   Please read over the following fact sheets that you were given: Pain Booklet, Coughing and Deep Breathing, Blood Transfusion Information, MRSA Information and Surgical Site Infection Prevention

## 2012-03-06 MED ORDER — VANCOMYCIN HCL IN DEXTROSE 1-5 GM/200ML-% IV SOLN
1000.0000 mg | INTRAVENOUS | Status: AC
  Administered 2012-03-07: 1000 mg via INTRAVENOUS
  Filled 2012-03-06: qty 200

## 2012-03-07 ENCOUNTER — Encounter (HOSPITAL_COMMUNITY): Payer: Self-pay | Admitting: Anesthesiology

## 2012-03-07 ENCOUNTER — Encounter (HOSPITAL_COMMUNITY): Payer: Self-pay | Admitting: *Deleted

## 2012-03-07 ENCOUNTER — Inpatient Hospital Stay (HOSPITAL_COMMUNITY)
Admission: RE | Admit: 2012-03-07 | Discharge: 2012-03-08 | DRG: 472 | Disposition: A | Discharge status: Home / Self Care | Payer: Medicare Other | Source: Ambulatory Visit | Attending: Neurological Surgery | Admitting: Neurological Surgery

## 2012-03-07 ENCOUNTER — Inpatient Hospital Stay (HOSPITAL_COMMUNITY): Payer: Medicare Other

## 2012-03-07 ENCOUNTER — Encounter (HOSPITAL_COMMUNITY)
Admission: RE | Disposition: A | Discharge status: Home / Self Care | Payer: Self-pay | Source: Ambulatory Visit | Attending: Neurological Surgery

## 2012-03-07 ENCOUNTER — Inpatient Hospital Stay (HOSPITAL_COMMUNITY): Payer: Medicare Other | Admitting: Anesthesiology

## 2012-03-07 DIAGNOSIS — M4714 Other spondylosis with myelopathy, thoracic region: Secondary | ICD-10-CM | POA: Diagnosis present

## 2012-03-07 DIAGNOSIS — G959 Disease of spinal cord, unspecified: Secondary | ICD-10-CM

## 2012-03-07 DIAGNOSIS — F17200 Nicotine dependence, unspecified, uncomplicated: Secondary | ICD-10-CM | POA: Diagnosis present

## 2012-03-07 DIAGNOSIS — M4712 Other spondylosis with myelopathy, cervical region: Principal | ICD-10-CM | POA: Diagnosis present

## 2012-03-07 DIAGNOSIS — Z7982 Long term (current) use of aspirin: Secondary | ICD-10-CM

## 2012-03-07 DIAGNOSIS — I10 Essential (primary) hypertension: Secondary | ICD-10-CM | POA: Diagnosis present

## 2012-03-07 HISTORY — PX: ANTERIOR CERVICAL CORPECTOMY: SHX1159

## 2012-03-07 SURGERY — ANTERIOR CERVICAL CORPECTOMY
Anesthesia: General | Site: Spine Cervical | Wound class: Clean

## 2012-03-07 MED ORDER — LIDOCAINE HCL (CARDIAC) 20 MG/ML IV SOLN
INTRAVENOUS | Status: DC | PRN
  Administered 2012-03-07: 30 mg via INTRAVENOUS

## 2012-03-07 MED ORDER — LACTATED RINGERS IV SOLN
INTRAVENOUS | Status: DC | PRN
  Administered 2012-03-07: 13:00:00 via INTRAVENOUS

## 2012-03-07 MED ORDER — MORPHINE SULFATE 2 MG/ML IJ SOLN: 1.0000 mg | INTRAMUSCULAR | Status: DC | PRN

## 2012-03-07 MED ORDER — STERILE WATER FOR IRRIGATION IR SOLN
Status: DC | PRN
  Administered 2012-03-07: 1000 mL

## 2012-03-07 MED ORDER — OXYCODONE HCL 5 MG PO TABS
ORAL_TABLET | ORAL | Status: AC
  Filled 2012-03-07: qty 1

## 2012-03-07 MED ORDER — ROCURONIUM BROMIDE 100 MG/10ML IV SOLN
INTRAVENOUS | Status: DC | PRN
  Administered 2012-03-07: 50 mg via INTRAVENOUS

## 2012-03-07 MED ORDER — 0.9 % SODIUM CHLORIDE (POUR BTL) OPTIME
TOPICAL | Status: DC | PRN
  Administered 2012-03-07: 1000 mL

## 2012-03-07 MED ORDER — ACETAMINOPHEN 650 MG RE SUPP: 650.0000 mg | RECTAL | Status: DC | PRN

## 2012-03-07 MED ORDER — MENTHOL 3 MG MT LOZG: 1.0000 | LOZENGE | OROMUCOSAL | Status: DC | PRN

## 2012-03-07 MED ORDER — SODIUM CHLORIDE 0.9 % IV SOLN
INTRAVENOUS | Status: AC
  Filled 2012-03-07: qty 500

## 2012-03-07 MED ORDER — HYDROMORPHONE HCL PF 1 MG/ML IJ SOLN
0.2500 mg | INTRAMUSCULAR | Status: DC | PRN
  Administered 2012-03-07 (×2): 0.5 mg via INTRAVENOUS

## 2012-03-07 MED ORDER — PHENOL 1.4 % MT LIQD: 1.0000 | OROMUCOSAL | Status: DC | PRN

## 2012-03-07 MED ORDER — THROMBIN 20000 UNITS EX KIT
PACK | CUTANEOUS | Status: DC | PRN
  Administered 2012-03-07: 20000 [IU] via TOPICAL

## 2012-03-07 MED ORDER — OXYCODONE HCL 5 MG PO TABS
5.0000 mg | ORAL_TABLET | Freq: Once | ORAL | Status: AC | PRN
  Administered 2012-03-07: 5 mg via ORAL

## 2012-03-07 MED ORDER — FENTANYL CITRATE 0.05 MG/ML IJ SOLN
INTRAMUSCULAR | Status: DC | PRN
  Administered 2012-03-07: 250 ug via INTRAVENOUS

## 2012-03-07 MED ORDER — OXYCODONE HCL 5 MG/5ML PO SOLN: 5.0000 mg | Freq: Once | ORAL | Status: AC | PRN

## 2012-03-07 MED ORDER — ALUM & MAG HYDROXIDE-SIMETH 200-200-20 MG/5ML PO SUSP: 30.0000 mL | Freq: Four times a day (QID) | ORAL | Status: DC | PRN

## 2012-03-07 MED ORDER — ONDANSETRON HCL 4 MG/2ML IJ SOLN: 4.0000 mg | INTRAMUSCULAR | Status: DC | PRN

## 2012-03-07 MED ORDER — PANTOPRAZOLE SODIUM 40 MG PO TBEC
40.0000 mg | DELAYED_RELEASE_TABLET | Freq: Every day | ORAL | Status: DC
  Administered 2012-03-08: 40 mg via ORAL
  Filled 2012-03-07: qty 1

## 2012-03-07 MED ORDER — LISINOPRIL 20 MG PO TABS
20.0000 mg | ORAL_TABLET | Freq: Every day | ORAL | Status: DC
  Administered 2012-03-07 – 2012-03-08 (×2): 20 mg via ORAL
  Filled 2012-03-07 (×2): qty 1

## 2012-03-07 MED ORDER — NEOSTIGMINE METHYLSULFATE 1 MG/ML IJ SOLN
INTRAMUSCULAR | Status: DC | PRN
  Administered 2012-03-07: 3 mg via INTRAVENOUS

## 2012-03-07 MED ORDER — DIAZEPAM 5 MG PO TABS
5.0000 mg | ORAL_TABLET | Freq: Four times a day (QID) | ORAL | Status: DC | PRN
  Administered 2012-03-07: 5 mg via ORAL
  Filled 2012-03-07: qty 1

## 2012-03-07 MED ORDER — LIDOCAINE-EPINEPHRINE 1 %-1:100000 IJ SOLN
INTRAMUSCULAR | Status: DC | PRN
  Administered 2012-03-07: 3 mL

## 2012-03-07 MED ORDER — DEXAMETHASONE SODIUM PHOSPHATE 4 MG/ML IJ SOLN
INTRAMUSCULAR | Status: DC | PRN
  Administered 2012-03-07: 10 mg via INTRAVENOUS

## 2012-03-07 MED ORDER — BACITRACIN 50000 UNITS IM SOLR
INTRAMUSCULAR | Status: AC
  Filled 2012-03-07: qty 1

## 2012-03-07 MED ORDER — DIAZEPAM 5 MG PO TABS: 5.0000 mg | ORAL_TABLET | Freq: Four times a day (QID) | ORAL | Status: DC | PRN

## 2012-03-07 MED ORDER — MEPERIDINE HCL 25 MG/ML IJ SOLN: 6.2500 mg | INTRAMUSCULAR | Status: DC | PRN

## 2012-03-07 MED ORDER — OXYCODONE-ACETAMINOPHEN 5-325 MG PO TABS: 1.0000 | ORAL_TABLET | ORAL | Status: DC | PRN

## 2012-03-07 MED ORDER — OXYCODONE-ACETAMINOPHEN 5-325 MG PO TABS
1.0000 | ORAL_TABLET | ORAL | Status: DC | PRN
  Administered 2012-03-08 (×2): 1 via ORAL
  Filled 2012-03-07 (×2): qty 1

## 2012-03-07 MED ORDER — CEFAZOLIN SODIUM 1-5 GM-% IV SOLN: 1.0000 g | Freq: Three times a day (TID) | INTRAVENOUS | Status: DC

## 2012-03-07 MED ORDER — PROPOFOL 10 MG/ML IV BOLUS
INTRAVENOUS | Status: DC | PRN
  Administered 2012-03-07: 100 mg via INTRAVENOUS

## 2012-03-07 MED ORDER — ACETAMINOPHEN 325 MG PO TABS: 650.0000 mg | ORAL_TABLET | ORAL | Status: DC | PRN

## 2012-03-07 MED ORDER — HYDROMORPHONE HCL PF 1 MG/ML IJ SOLN
INTRAMUSCULAR | Status: AC
  Filled 2012-03-07: qty 1

## 2012-03-07 MED ORDER — ONDANSETRON HCL 4 MG/2ML IJ SOLN
INTRAMUSCULAR | Status: DC | PRN
  Administered 2012-03-07: 4 mg via INTRAVENOUS

## 2012-03-07 MED ORDER — SODIUM CHLORIDE 0.9 % IJ SOLN: 3.0000 mL | INTRAMUSCULAR | Status: DC | PRN

## 2012-03-07 MED ORDER — SODIUM CHLORIDE 0.9 % IJ SOLN: 3.0000 mL | Freq: Two times a day (BID) | INTRAMUSCULAR | Status: DC

## 2012-03-07 MED ORDER — LACTATED RINGERS IV SOLN
INTRAVENOUS | Status: DC | PRN
  Administered 2012-03-07: 12:00:00 via INTRAVENOUS

## 2012-03-07 MED ORDER — MIDAZOLAM HCL 2 MG/2ML IJ SOLN: 0.5000 mg | Freq: Once | INTRAMUSCULAR | Status: DC | PRN

## 2012-03-07 MED ORDER — SODIUM CHLORIDE 0.9 % IR SOLN
Status: DC | PRN
  Administered 2012-03-07: 14:00:00

## 2012-03-07 MED ORDER — GLYCOPYRROLATE 0.2 MG/ML IJ SOLN
INTRAMUSCULAR | Status: DC | PRN
  Administered 2012-03-07: 0.4 mg via INTRAVENOUS

## 2012-03-07 MED ORDER — PROMETHAZINE HCL 25 MG/ML IJ SOLN: 6.2500 mg | INTRAMUSCULAR | Status: DC | PRN

## 2012-03-07 MED ORDER — BUPIVACAINE HCL (PF) 0.5 % IJ SOLN
INTRAMUSCULAR | Status: DC | PRN
  Administered 2012-03-07: 3 mL

## 2012-03-07 MED ORDER — MIDAZOLAM HCL 5 MG/5ML IJ SOLN
INTRAMUSCULAR | Status: DC | PRN
  Administered 2012-03-07: 2 mg via INTRAVENOUS

## 2012-03-07 SURGICAL SUPPLY — 62 items
BAG DECANTER FOR FLEXI CONT (MISCELLANEOUS) ×2 IMPLANT
BANDAGE GAUZE ELAST BULKY 4 IN (GAUZE/BANDAGES/DRESSINGS) IMPLANT
BIT DRILL 14MM (INSTRUMENTS) ×1 IMPLANT
BIT DRILL NEURO 2X3.1 SFT TUCH (MISCELLANEOUS) ×1 IMPLANT
BUR BARREL STRAIGHT FLUTE 4.0 (BURR) ×2 IMPLANT
CAGE CORPECTOMY 24 MM (Cage) ×2 IMPLANT
CANISTER SUCTION 2500CC (MISCELLANEOUS) ×2 IMPLANT
CLOTH BEACON ORANGE TIMEOUT ST (SAFETY) ×2 IMPLANT
CONT SPEC 4OZ CLIKSEAL STRL BL (MISCELLANEOUS) ×2 IMPLANT
DECANTER SPIKE VIAL GLASS SM (MISCELLANEOUS) ×2 IMPLANT
DERMABOND ADVANCED (GAUZE/BANDAGES/DRESSINGS) ×1
DERMABOND ADVANCED .7 DNX12 (GAUZE/BANDAGES/DRESSINGS) ×1 IMPLANT
DRAIN CHANNEL 7F 3/4 FLAT (WOUND CARE) ×2 IMPLANT
DRAPE LAPAROTOMY 100X72 PEDS (DRAPES) ×2 IMPLANT
DRAPE MICROSCOPE LEICA (MISCELLANEOUS) ×2 IMPLANT
DRAPE POUCH INSTRU U-SHP 10X18 (DRAPES) ×2 IMPLANT
DRESSING TELFA 8X3 (GAUZE/BANDAGES/DRESSINGS) ×2 IMPLANT
DRILL 14MM (INSTRUMENTS) ×2
DRILL NEURO 2X3.1 SOFT TOUCH (MISCELLANEOUS) ×2
DRSG OPSITE 4X5.5 SM (GAUZE/BANDAGES/DRESSINGS) ×2 IMPLANT
DURAPREP 6ML APPLICATOR 50/CS (WOUND CARE) ×2 IMPLANT
ELECT REM PT RETURN 9FT ADLT (ELECTROSURGICAL) ×2
ELECTRODE REM PT RTRN 9FT ADLT (ELECTROSURGICAL) ×1 IMPLANT
EVACUATOR SILICONE 100CC (DRAIN) ×2 IMPLANT
GAUZE SPONGE 4X4 16PLY XRAY LF (GAUZE/BANDAGES/DRESSINGS) IMPLANT
GLOVE BIO SURGEON STRL SZ7.5 (GLOVE) IMPLANT
GLOVE BIO SURGEON STRL SZ8 (GLOVE) ×2 IMPLANT
GLOVE BIOGEL PI IND STRL 7.5 (GLOVE) IMPLANT
GLOVE BIOGEL PI IND STRL 8.5 (GLOVE) ×2 IMPLANT
GLOVE BIOGEL PI INDICATOR 7.5 (GLOVE)
GLOVE BIOGEL PI INDICATOR 8.5 (GLOVE) ×2
GLOVE ECLIPSE 8.5 STRL (GLOVE) ×4 IMPLANT
GLOVE EXAM NITRILE LRG STRL (GLOVE) IMPLANT
GLOVE EXAM NITRILE MD LF STRL (GLOVE) IMPLANT
GLOVE EXAM NITRILE XL STR (GLOVE) IMPLANT
GLOVE EXAM NITRILE XS STR PU (GLOVE) IMPLANT
GLOVE SURG SS PI 8.0 STRL IVOR (GLOVE) ×4 IMPLANT
GOWN BRE IMP SLV AUR LG STRL (GOWN DISPOSABLE) IMPLANT
GOWN BRE IMP SLV AUR XL STRL (GOWN DISPOSABLE) ×4 IMPLANT
GOWN STRL REIN 2XL LVL4 (GOWN DISPOSABLE) ×4 IMPLANT
HEAD HALTER (SOFTGOODS) ×2 IMPLANT
KIT BASIN OR (CUSTOM PROCEDURE TRAY) ×2 IMPLANT
KIT ROOM TURNOVER OR (KITS) ×2 IMPLANT
NEEDLE HYPO 22GX1.5 SAFETY (NEEDLE) ×2 IMPLANT
NEEDLE SPNL 22GX3.5 QUINCKE BK (NEEDLE) ×2 IMPLANT
NS IRRIG 1000ML POUR BTL (IV SOLUTION) ×2 IMPLANT
PACK LAMINECTOMY NEURO (CUSTOM PROCEDURE TRAY) ×2 IMPLANT
PAD ARMBOARD 7.5X6 YLW CONV (MISCELLANEOUS) ×6 IMPLANT
PLATE 30MM (Plate) ×2 IMPLANT
PUTTY BONE 2.5CC ×2 IMPLANT
RUBBERBAND STERILE (MISCELLANEOUS) ×4 IMPLANT
SCREW 14MM (Screw) ×8 IMPLANT
SPONGE GAUZE 4X4 12PLY (GAUZE/BANDAGES/DRESSINGS) ×2 IMPLANT
SPONGE INTESTINAL PEANUT (DISPOSABLE) ×2 IMPLANT
SPONGE SURGIFOAM ABS GEL 100 (HEMOSTASIS) ×2 IMPLANT
SUT VIC AB 3-0 SH 8-18 (SUTURE) ×4 IMPLANT
SYR 20ML ECCENTRIC (SYRINGE) ×2 IMPLANT
TAPE CLOTH SURG 4X10 WHT LF (GAUZE/BANDAGES/DRESSINGS) ×2 IMPLANT
TOWEL OR 17X24 6PK STRL BLUE (TOWEL DISPOSABLE) ×2 IMPLANT
TOWEL OR 17X26 10 PK STRL BLUE (TOWEL DISPOSABLE) ×2 IMPLANT
TRAP SPECIMEN MUCOUS 40CC (MISCELLANEOUS) ×2 IMPLANT
WATER STERILE IRR 1000ML POUR (IV SOLUTION) ×2 IMPLANT

## 2012-03-07 NOTE — Transfer of Care (Signed)
Immediate Anesthesia Transfer of Care Note  Patient: [name]  Procedure(s) Performed: Procedure(s) (LRB) with comments: ANTERIOR CERVICAL CORPECTOMY (N/A) - Cervical six-seven, cervical seven-thoracic one Anterior cervical decompression/diskectomy/fusion, with Cervical seven Corpectomy, reconstruction using Allograft and Alphatec plate  Patient Location: PACU  Anesthesia Type: General  Level of Consciousness: awake, alert  and oriented  Airway & Oxygen Therapy: Patient Spontanous Breathing and Patient connected to nasal cannula oxygen  Post-op Assessment: Report given to PACU RN and Post -op Vital signs reviewed and stable  Post vital signs: Reviewed and stable  Complications: No apparent anesthesia complications

## 2012-03-07 NOTE — Anesthesia Preprocedure Evaluation (Addendum)
Anesthesia Evaluation  Patient identified by MRN, date of birth, ID band Patient awake    Reviewed: Allergy & Precautions, H&P , NPO status , Patient's Chart, lab work & pertinent test results  History of Anesthesia Complications Negative for: history of anesthetic complications  Airway Mallampati: I TM Distance: >3 FB Neck ROM: Full    Dental  (+) Edentulous Upper and Edentulous Lower   Pulmonary Current Smoker,  breath sounds clear to auscultation  Pulmonary exam normal       Cardiovascular hypertension, Pt. on medications Rhythm:Regular Rate:Normal  9/13 ECHO: normal LVF, EF 65-70%, valves OK   Neuro/Psych CVA (R sided symptoms, mostly resolved), Residual Symptoms negative psych ROS   GI/Hepatic Neg liver ROS, GERD-  Medicated and Controlled,  Endo/Other  negative endocrine ROS  Renal/GU negative Renal ROS     Musculoskeletal   Abdominal   Peds  Hematology   Anesthesia Other Findings   Reproductive/Obstetrics                          Anesthesia Physical Anesthesia Plan  ASA: III  Anesthesia Plan: General   Post-op Pain Management:    Induction: Intravenous  Airway Management Planned: Oral ETT and Video Laryngoscope Planned  Additional Equipment:   Intra-op Plan:   Post-operative Plan: Extubation in OR  Informed Consent: I have reviewed the patients History and Physical, chart, labs and discussed the procedure including the risks, benefits and alternatives for the proposed anesthesia with the patient or authorized representative who has indicated his/her understanding and acceptance.     Plan Discussed with: CRNA and Surgeon  Anesthesia Plan Comments: (Plan routine monitors, GEAT with VideoGlide intubation  )        Anesthesia Quick Evaluation

## 2012-03-07 NOTE — Preoperative (Signed)
Beta Blockers   Reason not to administer Beta Blockers:Not Applicable 

## 2012-03-07 NOTE — Progress Notes (Signed)
Patient ID: [name], female   DOB: 1940/09/24, 71 y.o.   MRN: [id number] Vital signs are stable patient feels well drain with moderate output. Motor function is good. We'll mobilize and plan discharge in morning.

## 2012-03-07 NOTE — Anesthesia Postprocedure Evaluation (Signed)
  Anesthesia Post-op Note  Patient: [name]  Procedure(s) Performed: Procedure(s) (LRB) with comments: ANTERIOR CERVICAL CORPECTOMY (N/A) - Cervical six-seven, cervical seven-thoracic one Anterior cervical decompression/diskectomy/fusion, with Cervical seven Corpectomy, reconstruction using Allograft and Alphatec plate  Patient Location: PACU  Anesthesia Type: General  Level of Consciousness: awake, alert , oriented and patient cooperative  Airway and Oxygen Therapy: Patient Spontanous Breathing and Patient connected to nasal cannula oxygen  Post-op Pain: mild  Post-op Assessment: Post-op Vital signs reviewed, Patient's Cardiovascular Status Stable, Respiratory Function Stable, Patent Airway, No signs of Nausea or vomiting and Pain level controlled  Post-op Vital Signs: Reviewed and stable  Complications: No apparent anesthesia complications

## 2012-03-07 NOTE — Plan of Care (Signed)
Problem: Consults Goal: Diagnosis - Spinal Surgery Outcome: Completed/Met Date Met:  03/07/12 Cervical Spine Fusion

## 2012-03-07 NOTE — H&P (Signed)
Shakota Lengacher   DOB: [birth year] #[id number]     CHIEF COMPLAINT:   Throbbing in the left shoulder, arm, and leg that started about 2 week ago.   HISTORY OF PRESENT ILLNESS:  [name] is a 71 year old ambidextrous individual who tells me that she rather suddenly started to develop throbbing and weakness in the left arm, shoulder, and leg. She was hospitalized for this process and had a workup at [facility].  They were initially concerned that she had a stroke. An MRI did not disclose any abnormality. She had an MRA which showed that her vasculature was essentially normal.  Then an MRI of the neck was performed and it revealed the presence of advanced spondylosis with cord compression at C6-7 and at C7-T1 with what appears to be a trapped fragment of disc behind the vertebral body at C7.  The patient was started on high dose steroids and she notes that much of her symptoms have improved.  She is seen now for consultation regarding the disc herniation and consideration of surgical intervention for this process.    PAST MEDICAL HISTORY:  Her general health has been good. She has hypertension.  Her only surgery has been a hysterectomy years ago.  Her current medications include Crestor 40 mg. a day, Lisinopril 20 mg. a day, Prednisone 10 mg., Omeprazole 20 mg., and an Aspirin daily.    SOCIAL HISTORY:    She smokes about a half a pack of cigarettes a day.  She does not drink alcohol.  Her height and weight have been stable at 5'1 " and 155 lbs.    REVIEW OF SYSTEMS:   Her systems review is notable for night sweats, wearing of glasses, a balance disturbance, nasal congestion and drainage, sinus problems and sinus headache, high blood pressure, high cholesterol, leg pain while walking, difficulty with starting and stopping a urinary stream, arm weakness, leg weakness, arm pain, leg pain, arthritis, neck pain, facial weakness, difficulty with her memory, difficulty with speech and excessive  thirst and urination that are noted on a 14-point review sheet in the office today.    PHYSICAL EXAMINATION:  On physical examination, I see that she is awake, alert, and oriented.  She is of normal thought and speech.  Her gait reveals that she has a slight left-sided weakness with a slight left-sided spasticity that is noted.  Her motor strength appears good in the major groups including the iliopsoas, quadriceps, tibialis anterior, and the gastrocnemii.  Her station reveals a slight Romberg test. She has difficulty with walking onto her heels, although her tibialis anterior strength is normal.  Her deep tendon reflexes in the upper extremities reveals hyporeflexia on the left side in the biceps and triceps, hyporeflexia on the left side in the patella and Achilles with an upgoing Babinski on the left side.  Her right-sided reflexes are normal.  Cranial nerve examination reveals that her pupils are 3 mm., briskly reactive to light and accommodation. The extraocular movements are full and the face is symmetric. Tongue and uvula protrude in the midline.    IMPRESSION:    The patient has evidence of severe spondylosis at C7-T1 and at C6-7 with evidence of cord compression.  There does not appear to be any intrinsic cord signal change at this time, but the patient has been suffering with some left-sided weakness as manifests in her upper extremity and her lower extremity.  Ultimately, I believe that she will require surgical decompression and this will likely  require a corpectomy of the C7 vertebra.  I discussed the surgical technique with her and indicated that the surgery would require an anterior approach with removal of the disc at C6-7 and C7-T1, and then removal of the center of the vertebra to remove the fragment of disc behind the vertebra.  Once this is decompressed, we place a bone graft into the interspace and across the vertebral body to allow this to heal as a singular bone.  This is held in place  with a Titanium plate.  The surgery typically involves an overnight stay in the hospital.  The major risks were discussed including, but not limited to the potential for spinal cord or nerve root injury, the possibility of esophageal or tracheal injury or injury to the recurrent laryngeal nerve to the voice box which can lead to hoarseness. All these things notwithstanding, I believe that the surgery ultimately needs to be considered, although at this time with her improving substantially, it is less emergent.  I have advised that she continue finishing her steroid dose course.  If things remain stable, we will schedule the surgery electively over the next several weeks.  If things should deteriorate, then we will make the surgery occur more urgently.

## 2012-03-07 NOTE — Op Note (Signed)
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 Surgeon: Barnett Abu Assistant: Lelon Perla Anesthesia: Gen. endotracheal Indications: Patient is a 71 year old individual is had progressive weakness in her arms and hands and difficulty with walking she has evidence of cord compression at the levels of C6-7 and C7-T1 with severe degenerative changes in the discs at those levels in addition to ossification the posterior longitudinal ligament causing cord compression she was advised regarding the need for surgical decompression of this area.  Procedure patient was brought to the operating room supine on a stretcher after the smooth induction of general endotracheal anesthesia she was placed in 5 pounds of halter traction. The neck was scrubbed with alcohol and DuraPrep and draped in a sterile fashion. Transverse incision was made at the base of the neck on the left side and this was carried down to the platysma the plane between the sternomastoid and strap muscles dissected bluntly until the first identifiable disc space was marked with a needle identifying the C6-C7. Dissection was then carried out to either side longus coli muscle and significant ventral osteophytosis that this area was encountered this was taken down with a high-speed drill and [employer], operational. The disc spaces were better identified at C6-7 and C7-T1. Then by starting at C6-7 this space was entered and a significant quantity of severely degenerated and desiccated disc material was removed using a combination of  curettes and rongeurs. Has region of the posterior longitudinal ligament was reached, bony osteophytes were encountered from the inferior margin of the vertebral body of C6 and in the uncinate processes these were then drilled down with a high-speed drill and 2 mm dissecting tool. The procedure was carried into the lateral recesses to decompress the C7 nerve roots completely. Then a similar discectomy was performed at the C7-T1 level. The intervening body was then removed using a high-speed drill and a 4 mm barrel bit bone from the drilling was aspirated and saved for use as graft. The region of the posterior longitudinal ligament was then reached and thickened redundant ligament with calcification was encountered this was partially adherent to the dura in the left lateral aspect. The calcification was lifted carefully and dissected away from the dura. Some scar attachments were left attached to the dura. In the end a good central decompression of the canal was achieved with undercutting of T1 superiorly and C6 on the opposite side. The area was then checked for hemostasis and a spacer was fitted this was found to be a 24 mm peek spacer from Alphatec plate was filled with the drilled away bone. After camping this into position a 32 mm standard size Alphatec plate trestle right was fixed to the ventral aspect of the vertebra vertebral bodies at C6 and T1 using 14 mm variable screws these are secured the plate. A final radiograph was obtained identifying good fixation the lateral gutters were then packed with demineralized bone matrix and some larger pieces of bone that were harvested from the corpectomy. Hemostasis was then checked and the soft tissues because of the nature and extent of the exposure a small Jackson-Pratt drain was placed into the prevertebral space and brought out through separate stab incision.  The platysma was closed with 3-0 Vicryl interrupted fashion and 3-0 Vicryl was used subcuticularly.  Dermabond was placed on the skin. A dry dressing was placed over the drain. Patient tolerated the procedure well blood loss was estimated at approximately 200 cc.

## 2012-03-07 NOTE — Discharge Summary (Signed)
Physician Discharge Summary  Patient ID: [name] MRN: [id number] DOB/AGE: Dec 15, 1940 71 y.o.  Admit date: 03/07/2012 Discharge date: 03/07/2012  Admission Diagnoses: Cervical spondylosis with myelopathy C7 T1-C6 and C7  Discharge Diagnoses: Cervical spondylosis with myelopathy C7-T1 and C6-C7 ossification of the posterior longitudinal ligament Active Problems:  * No active hospital problems. *    Discharged Condition: good  Hospital Course: Patient was admitted to undergo surgical decompression at C6-7 and C7-T1 via corpectomy for ossification of posterior longitudinal ligament.. She tolerated the surgery well she is discharged home  Consults: None  Significant Diagnostic Studies: MRI cervical spine  Treatments: surgery: Anterior cervical decompression C67 C7-T1 corpectomy C7 vertebral body reconstruction with peek spacer autograft and allograft anterior plate fixation W0-J8  Discharge Exam: Blood pressure 127/81, pulse 70, temperature 99.1 F (37.3 C), temperature source Oral, resp. rate 18, SpO2 92.00%. Incision is clean and dry motor function is normal  Disposition: 01-Home or Self Care  Discharge Orders    Future Orders Please Complete By Expires   Diet - low sodium heart healthy      Increase activity slowly      Discharge instructions      Comments:   Okay to shower. Do not apply salves or appointments to incision. No heavy lifting with the upper extremities greater than 15 pounds. May resume driving when not requiring pain medication and patient feels comfortable with doing so.   Call MD for:  redness, tenderness, or signs of infection (pain, swelling, redness, odor or green/yellow discharge around incision site)      Call MD for:  severe uncontrolled pain      Call MD for:  temperature >100.4      Care order/instruction      Scheduling Instructions:   Remove drain in neck apply dry sterile dress       Medication List     As of 03/07/2012  7:16 PM      TAKE these medications         aspirin 325 MG tablet   Take 325 mg by mouth daily.      diazepam 5 MG tablet   Commonly known as: VALIUM   Take 1 tablet (5 mg total) by mouth every 6 (six) hours as needed (Muscle spasm).      lisinopril 20 MG tablet   Commonly known as: PRINIVIL,ZESTRIL   Take 20 mg by mouth daily.      omeprazole 20 MG capsule   Commonly known as: PRILOSEC   Take 20 mg by mouth 2 (two) times daily.      oxyCODONE-acetaminophen 5-325 MG per tablet   Commonly known as: PERCOCET/ROXICET   Take 1-2 tablets by mouth every 4 (four) hours as needed for pain.      rosuvastatin 40 MG tablet   Commonly known as: CRESTOR   Take 40 mg by mouth at bedtime.         SignedStefani Dama 03/07/2012, 7:16 PM

## 2012-03-08 NOTE — Evaluation (Signed)
Occupational Therapy Evaluation Patient Details Name: [name] MRN: [id number] DOB: 05-Mar-1941 Today's Date: 03/08/2012 Time: 4098-1191 OT Time Calculation (min): 27 min  OT Assessment / Plan / Recommendation Clinical Impression  Pt admitted for decompression of C6-T1.  Instructed in cervical precautions related to ADL and IADL.  No further OT or equipment needs.    OT Assessment  Patient does not need any further OT services    Follow Up Recommendations  No OT follow up    Barriers to Discharge      Equipment Recommendations  None recommended by OT    Recommendations for Other Services    Frequency       Precautions / Restrictions Precautions Precautions: Cervical Precaution Comments: instructed in cervical precautions Restrictions Weight Bearing Restrictions: No   Pertinent Vitals/Pain premedicated    ADL  Eating/Feeding: Simulated;Independent Where Assessed - Eating/Feeding: Chair Grooming: Performed;Wash/dry hands;Independent Where Assessed - Grooming: Unsupported standing Upper Body Bathing: Simulated;Independent Where Assessed - Upper Body Bathing: Unsupported standing Lower Body Bathing: Simulated;Independent Where Assessed - Lower Body Bathing: Unsupported sit to stand Upper Body Dressing: Performed;Independent Where Assessed - Upper Body Dressing: Unsupported standing Lower Body Dressing: Performed;Independent Where Assessed - Lower Body Dressing: Unsupported sit to stand Toilet Transfer: Performed;Independent Toilet Transfer Method: Sit to Barista: Regular height toilet Toileting - Clothing Manipulation and Hygiene: Performed;Independent Where Assessed - Toileting Clothing Manipulation and Hygiene: Sit to stand from 3-in-1 or toilet Equipment Used: Gait belt Transfers/Ambulation Related to ADLs: independent in room, pt reports unsteadiness in the community ADL Comments: Instructed pt in cervical precautions related to  ADL/IADL.  Discussed option of using a cart to transport her groceries.    OT Diagnosis:    OT Problem List:   OT Treatment Interventions:     OT Goals    Visit Information  Last OT Received On: 03/08/12 Assistance Needed: +1    Subjective Data  Subjective: "I sling groceries on my shoulders and walk from [employer]." Patient Stated Goal: Return to PLOF.   Prior Functioning     Home Living Lives With: Spouse Available Help at Discharge: Family Type of Home: House Home Access: Stairs to enter Secretary/administrator of Steps: 1 Entrance Stairs-Rails: None Home Layout: One level Bathroom Shower/Tub: Forensic scientist: Standard Home Adaptive Equipment: None Additional Comments: Pt feels like she needs a cane. Prior Function Level of Independence: Independent Able to Take Stairs?: Yes Driving: No Vocation: Retired Musician: No difficulties Dominant Hand: Right         Vision/Perception     Cognition  Overall Cognitive Status: Appears within functional limits for tasks assessed/performed Arousal/Alertness: Awake/alert Orientation Level: Appears intact for tasks assessed Behavior During Session: [facility] for tasks performed    Extremity/Trunk Assessment Right Upper Extremity Assessment RUE ROM/Strength/Tone: Unable to fully assess;Due to precautions;WFL for tasks assessed Left Upper Extremity Assessment LUE ROM/Strength/Tone: [facility] for tasks assessed;Unable to fully assess;Due to precautions Trunk Assessment Trunk Assessment: Normal     Mobility Bed Mobility Bed Mobility: Supine to Sit;Sitting - Scoot to Edge of Bed Supine to Sit: 6: Modified independent (Device/Increase time);HOB elevated Sitting - Scoot to Edge of Bed: With rail;6: Modified independent (Device/Increase time) Transfers Transfers: Sit to Stand;Stand to Sit Sit to Stand: 7: Independent;From bed;From toilet Stand to Sit: 7: Independent;To chair/3-in-1;To  toilet     Shoulder Instructions     Exercise     Balance Balance Balance Assessed: No   End of Session OT - End  of Session Activity Tolerance: Patient tolerated treatment well Patient left: in chair;with call bell/phone within reach Nurse Communication: Mobility status  GO Functional Assessment Tool Used: clinical judgement Functional Limitation: Self care Self Care Current Status (Z6109): At least 1 percent but less than 20 percent impaired, limited or restricted Self Care Goal Status (U0454): At least 1 percent but less than 20 percent impaired, limited or restricted Self Care Discharge Status [phone]): At least 1 percent but less than 20 percent impaired, limited or restricted   Evern Bio 03/08/2012, 9:37 AM [phone]

## 2012-03-08 NOTE — Evaluation (Signed)
Physical Therapy Evaluation Patient Details Name: [name] MRN: [id number] DOB: 1941/05/25 Today's Date: 03/08/2012 Time: 6440-3474 PT Time Calculation (min): 30 min  PT Assessment / Plan / Recommendation Clinical Impression  pt s/p ACDF.  Mobility I and safe.  All education compete incl cervical precautions, lifting restrictions, bed mobility and progression of activity.  pt verbalizes or demo's understanding.  No further PT needs. D/C/    PT Assessment  Patent does not need any further PT services    Follow Up Recommendations  No PT follow up    Does the patient have the potential to tolerate intense rehabilitation      Barriers to Discharge        Equipment Recommendations  None recommended by PT    Recommendations for Other Services     Frequency      Precautions / Restrictions Precautions Precautions: Cervical Precaution Comments: instructed in cervical precautions Restrictions Weight Bearing Restrictions: No   Pertinent Vitals/Pain       Mobility  Bed Mobility Bed Mobility: Sitting - Scoot to Edge of Bed;Right Sidelying to Sit;Sit to Sidelying Right Right Sidelying to Sit: 7: Independent Supine to Sit: 6: Modified independent (Device/Increase time);HOB elevated Sitting - Scoot to Edge of Bed: 7: Independent Sit to Sidelying Right: 7: Independent Transfers Transfers: Sit to Stand;Stand to Sit Sit to Stand: 7: Independent;With upper extremity assist;From bed;From chair/3-in-1 Stand to Sit: 7: Independent;To chair/3-in-1 Ambulation/Gait Ambulation/Gait Assistance: 7: Independent;6: Modified independent (Device/Increase time) Ambulation Distance (Feet): 350 Feet Assistive device: Straight cane;Other (Comment) (for part of the time) Ambulation/Gait Assistance Details: steady and safe with/without cane, but feels much safer with cane Gait Pattern: Within Functional Limits    Shoulder Instructions     Exercises     PT Diagnosis:    PT Problem List:    PT Treatment Interventions:     PT Goals    Visit Information  Last PT Received On: 03/08/12 Assistance Needed: +1    Subjective Data  Subjective: I feel good about everything Patient Stated Goal: Home I   Prior Functioning  Home Living Lives With: Spouse Available Help at Discharge: Family Type of Home: House Home Access: Stairs to enter Secretary/administrator of Steps: 1 Entrance Stairs-Rails: None Home Layout: One level Bathroom Shower/Tub: Forensic scientist: Standard Home Adaptive Equipment: None Additional Comments: Pt feels like she needs a cane. Prior Function Level of Independence: Independent Able to Take Stairs?: Yes Driving: No Vocation: Retired Musician: No difficulties Dominant Hand: Right    Cognition  Overall Cognitive Status: Appears within functional limits for tasks assessed/performed Arousal/Alertness: Awake/alert Orientation Level: Appears intact for tasks assessed Behavior During Session: [facility] for tasks performed    Extremity/Trunk Assessment Right Upper Extremity Assessment RUE ROM/Strength/Tone: Unable to fully assess;Due to precautions;WFL for tasks assessed Left Upper Extremity Assessment LUE ROM/Strength/Tone: [facility] for tasks assessed;Unable to fully assess;Due to precautions Right Lower Extremity Assessment RLE ROM/Strength/Tone: Within functional levels RLE Sensation: WFL - Light Touch RLE Coordination: WFL - gross/fine motor Left Lower Extremity Assessment LLE ROM/Strength/Tone: Within functional levels LLE Sensation: WFL - Light Touch LLE Coordination: WFL - gross/fine motor Trunk Assessment Trunk Assessment: Normal   Balance Balance Balance Assessed: Yes  End of Session PT - End of Session Activity Tolerance: Patient tolerated treatment well Patient left: Other (comment);with call bell/phone within reach (sitting EOB) Nurse Communication: Mobility status  GP     Maximiliano Cromartie, Eliseo Gum 03/08/2012, 10:07 AM  03/08/2012  [place]erk[place] Bing, PT [phone]  [phone] (pager)

## 2012-03-09 NOTE — Progress Notes (Signed)
No NCM needs identified. Ryszard Socarras RN CCM Case Mgmt phone [phone] 

## 2012-03-10 ENCOUNTER — Encounter (HOSPITAL_COMMUNITY): Payer: Self-pay | Admitting: Neurological Surgery

## 2012-03-11 NOTE — Progress Notes (Signed)
Late Entry-- PT evaluation Addendum   03/31/12 0959  PT G-Codes **NOT FOR INPATIENT CLASS**  Functional Assessment Tool Used clinical judgement  Functional Limitation Mobility: Walking and moving around  Mobility: Walking and Moving Around Current Status (G9562) [facility]  Mobility: Walking and Moving Around Goal Status (Z3086) [facility]  Mobility: Walking and Moving Around Discharge Status (V7846) [facility]  PT General Charges  $$ ACUTE PT VISIT 1 Procedure  PT Evaluation  $Initial PT Evaluation Tier I 1 Procedure  PT Treatments  $Gait Training 8-22 mins   03/11/2012  [place] Bing, PT [phone] (pager)

## 2013-02-23 IMAGING — CR DG LUMBAR SPINE 1V
1 series · 1 of 1 positions shown · non-contrast
Comparison: Intraoperative exam 03/07/2012.

CLINICAL DATA: Cervical fusion.

LUMBAR SPINE - 1 VIEW

[view not recorded]
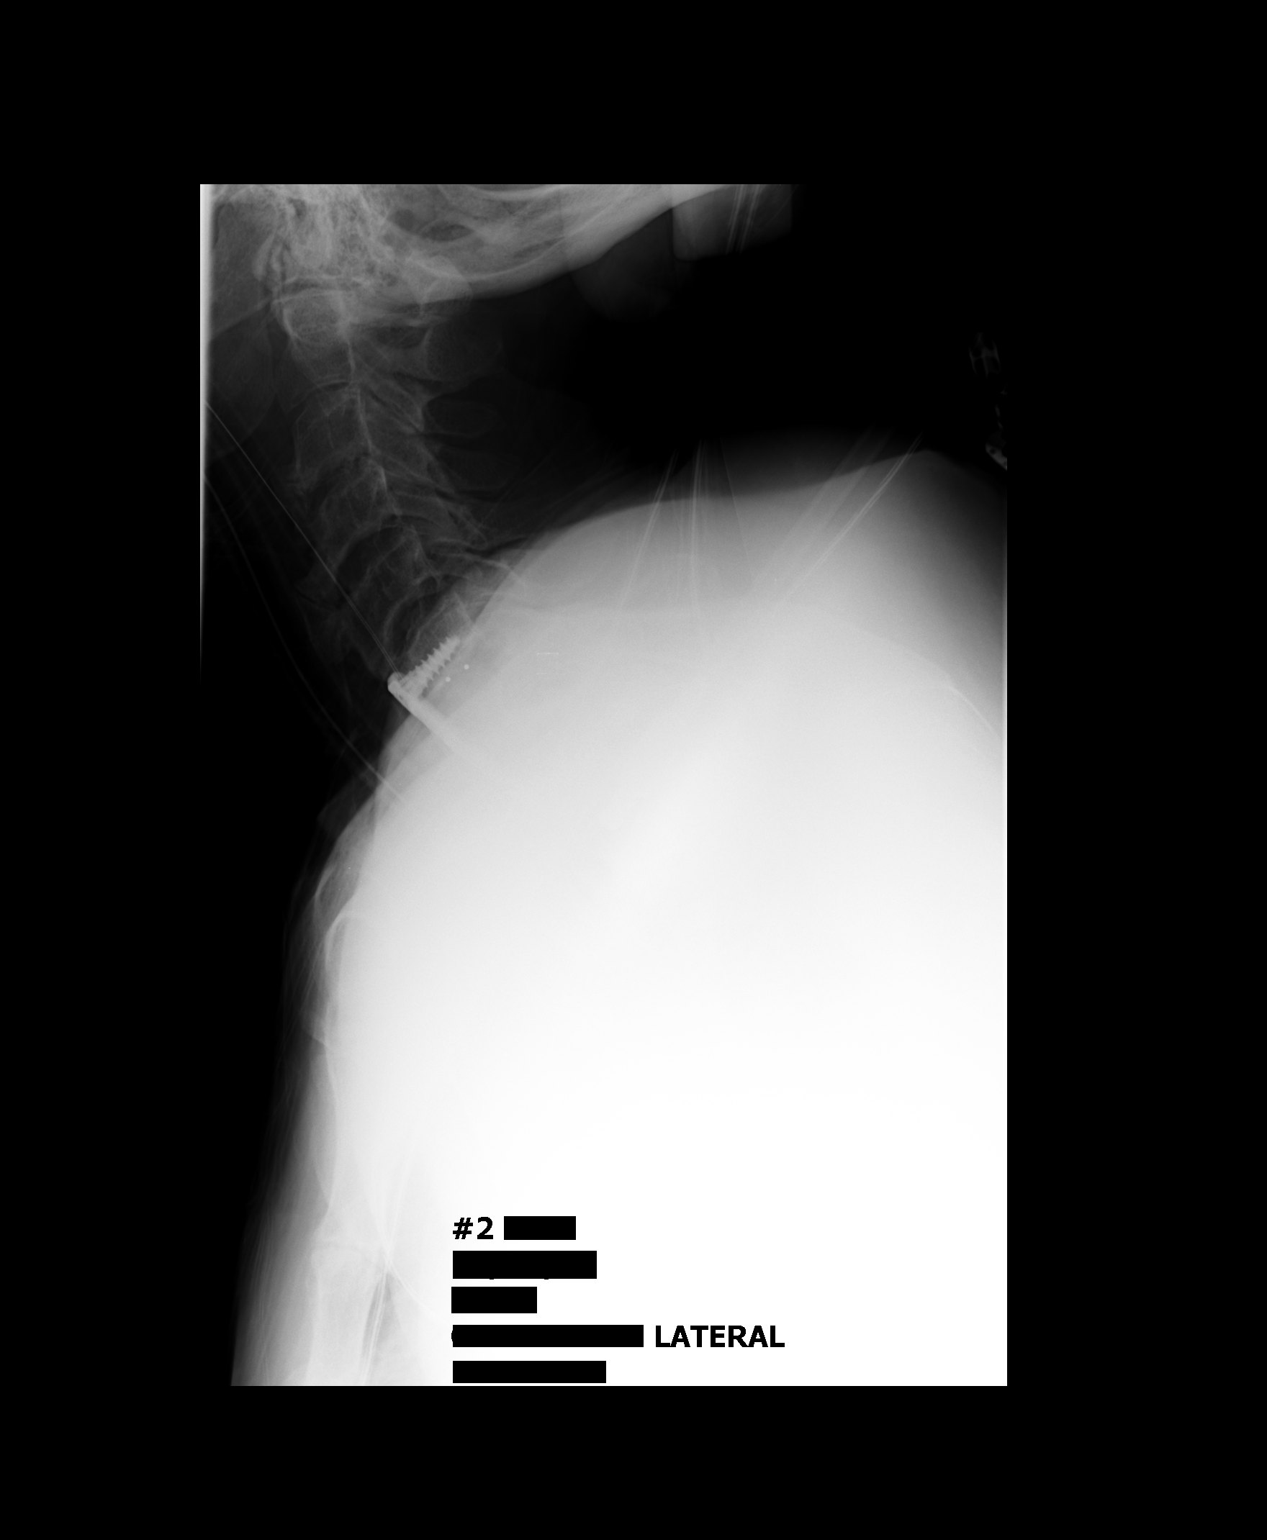

[1 of 1 positions shown; findings below may reference images not displayed]

FINDINGS: Present examination submitted for review after surgery.

[Only the superior aspect of the fusion is visualized secondary to
overlying shoulders.  Superior fusion is at the C6 level.  This can
be assessed on follow-up.
IMPRESSION: Only the superior aspect of the fusion is visualized secondary to
overlying shoulders.  Superior fusion is at the C6 level.

## 2013-08-14 ENCOUNTER — Ambulatory Visit (HOSPITAL_COMMUNITY)
Admission: RE | Admit: 2013-08-14 | Discharge: 2013-08-14 | Disposition: A | Discharge status: Home / Self Care | Payer: Medicare Other | Source: Ambulatory Visit | Attending: Family Medicine | Admitting: Family Medicine

## 2013-08-14 ENCOUNTER — Other Ambulatory Visit (HOSPITAL_COMMUNITY): Payer: Self-pay | Admitting: Family Medicine

## 2013-08-14 DIAGNOSIS — M545 Low back pain, unspecified: Secondary | ICD-10-CM | POA: Insufficient documentation

## 2013-08-14 DIAGNOSIS — M47817 Spondylosis without myelopathy or radiculopathy, lumbosacral region: Secondary | ICD-10-CM | POA: Insufficient documentation

## 2013-08-14 DIAGNOSIS — M549 Dorsalgia, unspecified: Secondary | ICD-10-CM

## 2013-08-14 DIAGNOSIS — Z1231 Encounter for screening mammogram for malignant neoplasm of breast: Secondary | ICD-10-CM

## 2013-08-20 ENCOUNTER — Ambulatory Visit (HOSPITAL_COMMUNITY)
Admission: RE | Admit: 2013-08-20 | Discharge: 2013-08-20 | Disposition: A | Discharge status: Home / Self Care | Payer: Medicare Other | Source: Ambulatory Visit | Attending: Family Medicine | Admitting: Family Medicine

## 2013-08-20 DIAGNOSIS — Z1231 Encounter for screening mammogram for malignant neoplasm of breast: Secondary | ICD-10-CM | POA: Insufficient documentation

## 2016-02-10 ENCOUNTER — Emergency Department (HOSPITAL_COMMUNITY)
Admission: EM | Admit: 2016-02-10 | Discharge: 2016-02-10 | Disposition: A | Discharge status: Home / Self Care | Payer: Medicare Other | Attending: Emergency Medicine | Admitting: Emergency Medicine

## 2016-02-10 ENCOUNTER — Encounter (HOSPITAL_COMMUNITY): Payer: Self-pay | Admitting: Cardiology

## 2016-02-10 ENCOUNTER — Emergency Department (HOSPITAL_COMMUNITY): Payer: Medicare Other

## 2016-02-10 DIAGNOSIS — R4182 Altered mental status, unspecified: Secondary | ICD-10-CM | POA: Diagnosis not present

## 2016-02-10 DIAGNOSIS — I10 Essential (primary) hypertension: Secondary | ICD-10-CM | POA: Diagnosis not present

## 2016-02-10 DIAGNOSIS — F17200 Nicotine dependence, unspecified, uncomplicated: Secondary | ICD-10-CM | POA: Diagnosis not present

## 2016-02-10 DIAGNOSIS — Z7982 Long term (current) use of aspirin: Secondary | ICD-10-CM | POA: Insufficient documentation

## 2016-02-10 DIAGNOSIS — Z79899 Other long term (current) drug therapy: Secondary | ICD-10-CM | POA: Insufficient documentation

## 2016-02-10 DIAGNOSIS — R413 Other amnesia: Secondary | ICD-10-CM | POA: Diagnosis present

## 2016-02-10 DIAGNOSIS — R51 Headache: Secondary | ICD-10-CM | POA: Insufficient documentation

## 2016-02-10 HISTORY — DX: Hypertensive encephalopathy: I67.4

## 2016-02-10 LAB — COMPREHENSIVE METABOLIC PANEL
ALBUMIN: 4.3 g/dL (ref 3.5–5.0)
ALK PHOS: 60 U/L (ref 38–126)
ALT: 12 U/L — ABNORMAL LOW (ref 14–54)
AST: 18 U/L (ref 15–41)
Anion gap: 9 (ref 5–15)
BILIRUBIN TOTAL: 0.8 mg/dL (ref 0.3–1.2)
BUN: 14 mg/dL (ref 6–20)
CALCIUM: 8.7 mg/dL — AB (ref 8.9–10.3)
CO2: 28 mmol/L (ref 22–32)
CREATININE: 0.95 mg/dL (ref 0.44–1.00)
Chloride: 104 mmol/L (ref 101–111)
GFR calc non Af Amer: 58 mL/min — ABNORMAL LOW (ref >60)
GLUCOSE: 88 mg/dL (ref 65–99)
Potassium: 3.7 mmol/L (ref 3.5–5.1)
Sodium: 141 mmol/L (ref 135–145)
Total Protein: 7.4 g/dL (ref 6.5–8.1)

## 2016-02-10 LAB — CBC
HCT: 50.6 % — ABNORMAL HIGH (ref 36.0–46.0)
Hemoglobin: 16.3 g/dL — ABNORMAL HIGH (ref 12.0–15.0)
MCH: 29.9 pg (ref 26.0–34.0)
MCHC: 32.2 g/dL (ref 30.0–36.0)
MCV: 92.7 fL (ref 78.0–100.0)
PLATELETS: 141 10*3/uL — AB (ref 150–400)
RBC: 5.46 MIL/uL — ABNORMAL HIGH (ref 3.87–5.11)
RDW: 14.3 % (ref 11.5–15.5)
WBC: 4.8 10*3/uL (ref 4.0–10.5)

## 2016-02-10 LAB — URINALYSIS, ROUTINE W REFLEX MICROSCOPIC
BILIRUBIN URINE: NEGATIVE
Glucose, UA: NEGATIVE mg/dL
HGB URINE DIPSTICK: NEGATIVE
KETONES UR: NEGATIVE mg/dL
Leukocytes, UA: NEGATIVE
Nitrite: NEGATIVE
PROTEIN: NEGATIVE mg/dL
Specific Gravity, Urine: 1.01 (ref 1.005–1.030)
pH: 7 (ref 5.0–8.0)

## 2016-02-10 NOTE — ED Triage Notes (Signed)
Pt seen at PCP today for memory loss of the past 24 hours.  B/p  222/100.  CBG 98.  C/o pressure behind her eyes.  History of similar episode in past.

## 2016-02-10 NOTE — ED Provider Notes (Signed)
[facility] DEPT Provider Note   CSN: [id number] Arrival date & time: 02/10/16  1108     History   Chief Complaint Chief Complaint  Patient presents with  . Altered Mental Status    HPI [name] is a 75 y.o. female.  Patient reports she had some mild memory loss of the past 24 hours.  She is unable to describe this any further.  She was seen in her physician's office today and was noted to have a blood pressure 222/100was sent to the ER for evaluation.  Her blood sugar was 98.  She does report mild headache at this time and some pressure behind both eyes.  She denies weakness of her arms or legs.  She denies chest pain shortness of breath.  No abdominal pain.  Denies nausea vomiting or diarrhea.  She has a history of hypertension his been compliant with her medications per the patient.  On my arrival to the patient's bedside she states she feels much better and is feeling more clear headed at this time   The history is provided by the patient and medical records.    Past Medical History:  Diagnosis Date  . Arthritis    OA  . GERD (gastroesophageal reflux disease)   . Headache(784.0)   . Hypertension   . Hypertensive encephalopathy   . Stroke (HCC)    SLIGHT RT SIDE WEAKNESS 2001    Patient Active Problem List   Diagnosis Date Noted  . 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HCC) 02/13/2012  . Slurred speech 02/11/2012  . Left-sided weakness 02/11/2012  . HTN (hypertension) 02/11/2012  . ACHILLES BURSITIS OR TENDINITIS 10/05/2009  . CALCANEAL SPUR 10/05/2009    Past Surgical History:  Procedure Laterality Date  . ABDOMINAL HYSTERECTOMY    . ANTERIOR CERVICAL CORPECTOMY  03/07/2012   Procedure: ANTERIOR CERVICAL CORPECTOMY;  Surgeon: Barnett Abu, MD;  Location: [facility];  Service: Neurosurgery;  Laterality: N/A;  Cervical six-seven, cervical seven-thoracic one Anterior cervical decompression/diskectomy/fusion, with Cervical seven  Corpectomy, reconstruction using Allograft and Alphatec plate    OB History    No data available       Home Medications    Prior to Admission medications   Medication Sig Start Date End Date Taking? Authorizing Provider  aspirin 325 MG tablet Take 325 mg by mouth daily. 02/14/12   Erick Blinks, MD  diazepam (VALIUM) 5 MG tablet Take 1 tablet (5 mg total) by mouth every 6 (six) hours as needed (Muscle spasm). 03/07/12   Barnett Abu, MD  lisinopril (PRINIVIL,ZESTRIL) 20 MG tablet Take 20 mg by mouth daily. 02/14/12   Erick Blinks, MD  omeprazole (PRILOSEC) 20 MG capsule Take 20 mg by mouth 2 (two) times daily.    Historical Provider, MD  oxyCODONE-acetaminophen (PERCOCET/ROXICET) 5-325 MG per tablet Take 1-2 tablets by mouth every 4 (four) hours as needed for pain. 03/07/12   Barnett Abu, MD  rosuvastatin (CRESTOR) 40 MG tablet Take 40 mg by mouth at bedtime.     Historical Provider, MD    Family History History reviewed. No pertinent family history.  Social History Social History  Substance Use Topics  . Smoking status: Current Some Day Smoker    Packs/day: 0.50  . Smokeless tobacco: Never Used  . Alcohol use No     Allergies   Penicillins   Review of Systems Review of Systems  All other systems reviewed and are negative.    Physical Exam Updated Vital Signs BP [name])  244/99 (BP Location: Right Arm)   Pulse 62   Temp 98.3 F (36.8 C)   Resp 18   Ht 5\' 1"  (1.549 m)   Wt 160 lb (72.6 kg)   SpO2 97%   BMI 30.23 kg/m   Physical Exam  Constitutional: She is oriented to person, place, and time. She appears well-developed and well-nourished. No distress.  HENT:  Head: Normocephalic and atraumatic.  Eyes: EOM are normal. Pupils are equal, round, and reactive to light.  Neck: Normal range of motion.  Cardiovascular: Normal rate, regular rhythm and normal heart sounds.   Pulmonary/Chest: Effort normal and breath sounds normal.  Abdominal: Soft. She exhibits no  distension. There is no tenderness.  Musculoskeletal: Normal range of motion.  Neurological: She is alert and oriented to person, place, and time.  5/5 strength in major muscle groups of  bilateral upper and lower extremities. Speech normal. No facial asymetry.   Skin: Skin is warm and dry.  Psychiatric: She has a normal mood and affect. Judgment normal.  Nursing note and vitals reviewed.    ED Treatments / Results  Labs (all labs ordered are listed, but only abnormal results are displayed) Labs Reviewed  COMPREHENSIVE METABOLIC PANEL - Abnormal; Notable for the following:       Result Value   Calcium 8.7 (*)    ALT 12 (*)    GFR calc non Af Amer 58 (*)    All other components within normal limits  CBC - Abnormal; Notable for the following:    RBC 5.46 (*)    Hemoglobin 16.3 (*)    HCT 50.6 (*)    Platelets 141 (*)    All other components within normal limits  URINALYSIS, ROUTINE W REFLEX MICROSCOPIC (NOT AT [facility])    EKG  EKG Interpretation None       Radiology Ct Head Wo Contrast  Result Date: 02/10/2016 CLINICAL DATA:  Memory loss over last 24 hours, pressure behind eyes, history hypertension, stroke, hypertensive encephalopathy, smoker EXAM: CT HEAD WITHOUT CONTRAST TECHNIQUE: Contiguous axial images were obtained from the base of the skull through the vertex without intravenous contrast. COMPARISON:  02/11/2012 FINDINGS: Normal ventricular morphology. No midline shift or mass effect. Mild small vessel chronic ischemic changes of deep cerebral white matter. Dense calcification within falx. No intracranial hemorrhage, mass lesion or evidence acute infarction. No extra-axial fluid collections. Hyperostosis frontalis interna. Paranasal sinuses clear and bones otherwise unremarkable. Atherosclerotic calcifications at the carotid siphons. IMPRESSION: Mild small vessel chronic ischemic changes of deep cerebral white matter. No acute intracranial abnormalities. No significant  interval change. Electronically Signed   By: Ulyses Southward M.D.   On: 02/10/2016 14:10    Procedures Procedures (including critical care time)  Medications Ordered in ED Medications - No data to display   Initial Impression / Assessment and Plan / ED Course  I have reviewed the triage vital signs and the nursing notes.  Pertinent labs & imaging results that were available during my care of the patient were reviewed by me and considered in my medical decision making (see chart for details).  Clinical Course    4:42 PM Overall well-appearing.  She is able to join emergency department.  Labs and head CT without acute abnormality.  Patient feels better.  Blood pressure improving without intervention.  Discharge home in good condition.  Primary care follow-up  Final Clinical Impressions(s) / ED Diagnoses   Final diagnoses:  Altered mental status, unspecified altered mental status type  New Prescriptions New Prescriptions   No medications on file     Azalia BilisKevin Yale Golla, MD 02/10/16 1643

## 2016-02-10 NOTE — ED Notes (Signed)
Patient states she is unable to hold urine and was unable to get a urine sample at this time.

## 2016-02-10 NOTE — ED Notes (Signed)
Neuro exam normal.  C/o both legs feeling numb and tingly.

## 2016-03-06 DIAGNOSIS — Z8673 Personal history of transient ischemic attack (TIA), and cerebral infarction without residual deficits: Secondary | ICD-10-CM | POA: Insufficient documentation

## 2016-03-06 DIAGNOSIS — F17210 Nicotine dependence, cigarettes, uncomplicated: Secondary | ICD-10-CM | POA: Insufficient documentation

## 2016-07-09 DIAGNOSIS — E785 Hyperlipidemia, unspecified: Secondary | ICD-10-CM | POA: Insufficient documentation

## 2016-09-17 NOTE — Congregational Nurse Program (Signed)
Congregational Nurse Program Note  Date of Encounter: 09/17/2016  Past Medical History: Past Medical History:  Diagnosis Date  . Arthritis    OA  . GERD (gastroesophageal reflux disease)   . Headache(784.0)   . Hypertension   . Hypertensive encephalopathy   . Stroke (HCC)    SLIGHT RT SIDE WEAKNESS 2001    Encounter Details:     CNP Questionnaire - 08/31/16 1245      Patient Demographics   Is this a new or existing patient? Existing   Patient is considered a/an Not Applicable   Race African-American/Black     Patient Assistance   Location of Patient Assistance [place]   Patient's financial/insurance status Medicare   Uninsured Patient ([employer] Connects) No   Patient referred to apply for the following financial assistance Not Applicable   Food insecurities addressed Provided food supplies   Transportation assistance No   Assistance securing medications No   Educational health offerings Hypertension;Nutrition;Safety     Encounter Details   Primary purpose of visit Education/Health Concerns   Was an Emergency Department visit averted? Not Applicable   Does patient have a medical provider? Yes   Patient referred to Follow up with established PCP   Was a mental health screening completed? (GAINS tool) No   Does patient have dental issues? No   Does patient have vision issues? No   Does your patient have an abnormal blood pressure today? Yes   Since previous encounter, have you referred patient for abnormal blood pressure that resulted in a new diagnosis or medication change? No   Does your patient have an abnormal blood glucose today? No   Since previous encounter, have you referred patient for abnormal blood glucose that resulted in a new diagnosis or medication change? No   Was there a life-saving intervention made? No    08/31/16 12:45 PM For B/P  Check . B/P 150/82 Pulse 66 Monitoring her pressure.   Cecilie Kicks, RN [phone].

## 2017-01-17 NOTE — Congregational Nurse Program (Signed)
Congregational Nurse Program Note  Date of Encounter: 01/17/2017  Past Medical History: Past Medical History:  Diagnosis Date  . Arthritis    OA  . GERD (gastroesophageal reflux disease)   . Headache(784.0)   . Hypertension   . Hypertensive encephalopathy   . Stroke (HCC)    SLIGHT RT SIDE WEAKNESS 2001    Encounter Details:     CNP Questionnaire - 12/21/16 1045      Patient Demographics   Is this a new or existing patient? Existing   Patient is considered a/an Not Applicable   Race African-American/Black     Patient Assistance   Location of Patient Assistance [place]   Patient's financial/insurance status [facility]   Uninsured Patient [facility] Card/Care Connects) No   Patient referred to apply for the following financial assistance Not Applicable   Food insecurities addressed Provided food supplies   Transportation assistance No   Assistance securing medications No   Educational health offerings Hypertension     Encounter Details   Primary purpose of visit Education/Health Concerns;Safety   Was an Emergency Department visit averted? Not Applicable   Does patient have a medical provider? Yes   Patient referred to Follow up with established PCP   Was a mental health screening completed? (GAINS tool) No   Does patient have dental issues? No   Does patient have vision issues? No   Does your patient have an abnormal blood pressure today? Yes   Since previous encounter, have you referred patient for abnormal blood pressure that resulted in a new diagnosis or medication change? No   Does your patient have an abnormal blood glucose today? No   Since previous encounter, have you referred patient for abnormal blood glucose that resulted in a new diagnosis or medication change? No   Was there a life-saving intervention made? No    B/P 163/87 Pulse 81.  Has metal screws in body. Has choking sensation @ times .  To see her Dr.

## 2017-01-20 NOTE — Congregational Nurse Program (Signed)
Congregational Nurse Program Note  Date of Encounter: 01/20/2017  Past Medical History: Past Medical History:  Diagnosis Date  . Arthritis    OA  . GERD (gastroesophageal reflux disease)   . Headache(784.0)   . Hypertension   . Hypertensive encephalopathy   . Stroke [facility])    SLIGHT RT SIDE WEAKNESS 2001    Encounter Details:     CNP Questionnaire - 01/20/17 0046      Patient Demographics   Is this a new or existing patient? Existing   Patient is considered a/an Not Applicable   Race African-American/Black     Patient Assistance   Location of Patient Assistance LOT   Patient's financial/insurance status Medicaid;Medicare   Uninsured Patient ([employer] trade [employer]) No   Patient referred to apply for the following financial assistance Not Applicable   Food insecurities addressed Provided food supplies   Transportation assistance No   Assistance securing medications No   Educational health offerings Hypertension     Encounter Details   Primary purpose of visit Education/Health Concerns;Safety   Was an Emergency Department visit averted? Not Applicable   Does patient have a medical provider? Yes   Patient referred to Not Applicable   Was a mental health screening completed? (GAINS tool) No   Does patient have dental issues? No   Does patient have vision issues? No   Does your patient have an abnormal blood pressure today? No   Since previous encounter, have you referred patient for abnormal blood pressure that resulted in a new diagnosis or medication change? No   Does your patient have an abnormal blood glucose today? No   Since previous encounter, have you referred patient for abnormal blood glucose that resulted in a new diagnosis or medication change? No    Check B/P Blood pressure 129/77 Pulse 67. Keeping tabs on B/P.For follow up with doctor. Cecilie Kicks, RN [phone]

## 2017-02-10 NOTE — Congregational Nurse Program (Signed)
Congregational Nurse Program Note  Date of Encounter: 02/10/2017  Past Medical History: Past Medical History:  Diagnosis Date  . Arthritis    OA  . GERD (gastroesophageal reflux disease)   . Headache(784.0)   . Hypertension   . Hypertensive encephalopathy   . Stroke (HCC)    SLIGHT RT SIDE WEAKNESS 2001    Encounter Details:     CNP Questionnaire - 02/08/17 1235      Patient Demographics   Is this a new or existing patient? Existing   Patient is considered a/an Not Applicable   Race African-American/Black     Patient Assistance   Location of Patient Assistance LOT   Patient's financial/insurance status Medicare   Uninsured Patient ([employer] trade union) No   Patient referred to apply for the following financial assistance Not Applicable   Food insecurities addressed Provided food supplies   Transportation assistance No   Assistance securing medications No   Educational health offerings Hypertension     Encounter Details   Primary purpose of visit Education/Health Concerns   Was an Emergency Department visit averted? No   Does patient have a medical provider? Yes   Patient referred to Not Applicable   Was a mental health screening completed? (GAINS tool) No   Does patient have dental issues? No   Does patient have vision issues? No   Does your patient have an abnormal blood pressure today? Yes   Since previous encounter, have you referred patient for abnormal blood pressure that resulted in a new diagnosis or medication change? Yes  Sees pcp and takes Medication   Does your patient have an abnormal blood glucose today? No   Since previous encounter, have you referred patient for abnormal blood glucose that resulted in a new diagnosis or medication change? No   Was there a life-saving intervention made? No    02/08/17 Take Blood pressure --B/P 182/84 Pulse 59.  Repeated B/P past 15 minute rest.  B/P 174/76 Pulse 57 Instructed to take medication as  directed. Will recheck B/P in 3-4 days for follow up.  [place] [facility] [phone].

## 2017-03-21 NOTE — Congregational Nurse Program (Unsigned)
Congregational Nurse Program Note  Date of Encounter: 03/21/2017  Past Medical History: Past Medical History:  Diagnosis Date  . Arthritis    OA  . GERD (gastroesophageal reflux disease)   . Headache(784.0)   . Hypertension   . Hypertensive encephalopathy   . Stroke (HCC)    SLIGHT RT SIDE WEAKNESS 2001    Encounter Details:     CNP Questionnaire - 03/15/17 1130      Patient Demographics   Is this a new or existing patient? Existing   Patient is considered a/an Not Applicable   Race African-American/Black     Patient Assistance   Location of Patient Assistance LOT [id number]   Patient's financial/insurance status Medicare   Uninsured Patient ([employer] Card/Care Connects) No   Patient referred to apply for the following financial assistance Not Applicable   Transportation assistance No   Assistance securing medications No   Educational health offerings Hypertension;Nutrition;Safety     Encounter Details   Primary purpose of visit Education/Health Concerns   Was an Emergency Department visit averted? Not Applicable   Does patient have a medical provider? Yes   Was a mental health screening completed? (GAINS tool) No   Does patient have dental issues? No   Does patient have vision issues? No   Does your patient have an abnormal blood pressure today? Yes  On B/P meds   Since previous encounter, have you referred patient for abnormal blood pressure that resulted in a new diagnosis or medication change? No   Does your patient have an abnormal blood glucose today? No   Since previous encounter, have you referred patient for abnormal blood glucose that resulted in a new diagnosis or medication change? No   Was there a life-saving intervention made? No    03/15/17 Blood Pressure 149/94 Pulse 76.  Had flu shot 3 weeks ago.  Takes meds for Blood pressure. Cecilie KicksLeanna Lawson, RN [phone].

## 2017-04-08 NOTE — Patient Instructions (Signed)
Your procedure is scheduled on: 04/12/2017   [facility] at  900  AM.  Call this number if you have problems the morning of surgery: [phone]   Do not eat food or drink liquids :After Midnight.      Take these medicines the morning of surgery with A SIP OF WATER: amlodipine, myrbetriq, protonix.   Do not wear jewelry, make-up or nail polish.  Do not wear lotions, powders, or perfumes. You may wear deodorant.  Do not shave 48 hours prior to surgery.  Do not bring valuables to the hospital.  Contacts, dentures or bridgework may not be worn into surgery.  Leave suitcase in the car. After surgery it may be brought to your room.  For patients admitted to the hospital, checkout time is 11:00 AM the day of discharge.   Patients discharged the day of surgery will not be allowed to drive home.  :     Please read over the following fact sheets that you were given: Coughing and Deep Breathing, Surgical Site Infection Prevention, Anesthesia Post-op Instructions and Care and Recovery After Surgery    Cataract A cataract is a clouding of the lens of the eye. When a lens becomes cloudy, vision is reduced based on the degree and nature of the clouding. Many cataracts reduce vision to some degree. Some cataracts make people more near-sighted as they develop. Other cataracts increase glare. Cataracts that are ignored and become worse can sometimes look white. The white color can be seen through the pupil. CAUSES   Aging. However, cataracts may occur at any age, even in newborns.   Certain drugs.   Trauma to the eye.   Certain diseases such as diabetes.   Specific eye diseases such as chronic inflammation inside the eye or a sudden attack of a rare form of glaucoma.   Inherited or acquired medical problems.  SYMPTOMS   Gradual, progressive drop in vision in the affected eye.   Severe, rapid visual loss. This most often happens when trauma is the cause.  DIAGNOSIS  To detect a  cataract, an eye doctor examines the lens. Cataracts are best diagnosed with an exam of the eyes with the pupils enlarged (dilated) by drops.  TREATMENT  For an early cataract, vision may improve by using different eyeglasses or stronger lighting. If that does not help your vision, surgery is the only effective treatment. A cataract needs to be surgically removed when vision loss interferes with your everyday activities, such as driving, reading, or watching TV. A cataract may also have to be removed if it prevents examination or treatment of another eye problem. Surgery removes the cloudy lens and usually replaces it with a substitute lens (intraocular lens, IOL).  At a time when both you and your doctor agree, the cataract will be surgically removed. If you have cataracts in both eyes, only one is usually removed at a time. This allows the operated eye to heal and be out of danger from any possible problems after surgery (such as infection or poor wound healing). In rare cases, a cataract may be doing damage to your eye. In these cases, your caregiver may advise surgical removal right away. The vast majority of people who have cataract surgery have better vision afterward. HOME CARE INSTRUCTIONS  If you are not planning surgery, you may be asked to do the following:  Use different eyeglasses.   Use stronger or brighter lighting.   Ask your eye doctor about reducing your  medicine dose or changing medicines if it is thought that a medicine caused your cataract. Changing medicines does not make the cataract go away on its own.   Become familiar with your surroundings. Poor vision can lead to injury. Avoid bumping into things on the affected side. You are at a higher risk for tripping or falling.   Exercise extreme care when driving or operating machinery.   Wear sunglasses if you are sensitive to bright light or experiencing problems with glare.  SEEK IMMEDIATE MEDICAL CARE IF:   You have a  worsening or sudden vision loss.   You notice redness, swelling, or increasing pain in the eye.   You have a fever.  Document Released: 05/14/2005 Document Revised: 05/03/2011 Document Reviewed: 01/05/2011 [facility] Patient Information 2012 [place].PATIENT INSTRUCTIONS POST-ANESTHESIA  IMMEDIATELY FOLLOWING SURGERY:  Do not drive or operate machinery for the first twenty four hours after surgery.  Do not make any important decisions for twenty four hours after surgery or while taking narcotic pain medications or sedatives.  If you develop intractable nausea and vomiting or a severe headache please notify your doctor immediately.  FOLLOW-UP:  Please make an appointment with your surgeon as instructed. You do not need to follow up with anesthesia unless specifically instructed to do so.  WOUND CARE INSTRUCTIONS (if applicable):  Keep a dry clean dressing on the anesthesia/puncture wound site if there is drainage.  Once the wound has quit draining you may leave it open to air.  Generally you should leave the bandage intact for twenty four hours unless there is drainage.  If the epidural site drains for more than 36-48 hours please call the anesthesia department.  QUESTIONS?:  Please feel free to call your physician or the hospital operator if you have any questions, and they will be happy to assist you.

## 2017-04-10 ENCOUNTER — Other Ambulatory Visit: Payer: Self-pay

## 2017-04-10 ENCOUNTER — Encounter (HOSPITAL_COMMUNITY): Payer: Self-pay

## 2017-04-10 ENCOUNTER — Encounter (HOSPITAL_COMMUNITY)
Admission: RE | Admit: 2017-04-10 | Discharge: 2017-04-10 | Disposition: A | Discharge status: Home / Self Care | Payer: Medicare Other | Source: Ambulatory Visit | Attending: Ophthalmology | Admitting: Ophthalmology

## 2017-04-10 DIAGNOSIS — Z880 Allergy status to penicillin: Secondary | ICD-10-CM | POA: Diagnosis not present

## 2017-04-10 DIAGNOSIS — I10 Essential (primary) hypertension: Secondary | ICD-10-CM | POA: Diagnosis not present

## 2017-04-10 DIAGNOSIS — F17200 Nicotine dependence, unspecified, uncomplicated: Secondary | ICD-10-CM | POA: Diagnosis not present

## 2017-04-10 DIAGNOSIS — H268 Other specified cataract: Secondary | ICD-10-CM | POA: Diagnosis present

## 2017-04-10 DIAGNOSIS — K219 Gastro-esophageal reflux disease without esophagitis: Secondary | ICD-10-CM | POA: Diagnosis not present

## 2017-04-10 DIAGNOSIS — I69398 Other sequelae of cerebral infarction: Secondary | ICD-10-CM | POA: Diagnosis not present

## 2017-04-10 HISTORY — DX: Inflammatory liver disease, unspecified: K75.9

## 2017-04-10 HISTORY — DX: Personal history of other diseases of the musculoskeletal system and connective tissue: Z87.39

## 2017-04-10 LAB — BASIC METABOLIC PANEL
Anion gap: 5 (ref 5–15)
BUN: 11 mg/dL (ref 6–20)
CO2: 30 mmol/L (ref 22–32)
CREATININE: 0.85 mg/dL (ref 0.44–1.00)
Calcium: 9.1 mg/dL (ref 8.9–10.3)
Chloride: 105 mmol/L (ref 101–111)
GFR calc Af Amer: >60 mL/min (ref >60)
Glucose, Bld: 103 mg/dL — ABNORMAL HIGH (ref 65–99)
POTASSIUM: 3.7 mmol/L (ref 3.5–5.1)
SODIUM: 140 mmol/L (ref 135–145)

## 2017-04-11 LAB — CBC WITH DIFFERENTIAL/PLATELET
BASOS ABS: 0 10*3/uL (ref 0.0–0.1)
Basophils Relative: 1 %
EOS ABS: 0.8 10*3/uL — AB (ref 0.0–0.7)
Eosinophils Relative: 14 %
HEMATOCRIT: 46.1 % — AB (ref 36.0–46.0)
HEMOGLOBIN: 14.8 g/dL (ref 12.0–15.0)
Lymphocytes Relative: 38 %
Lymphs Abs: 2.1 10*3/uL (ref 0.7–4.0)
MCH: 29.8 pg (ref 26.0–34.0)
MCHC: 32.1 g/dL (ref 30.0–36.0)
MCV: 92.9 fL (ref 78.0–100.0)
MONO ABS: 0.3 10*3/uL (ref 0.1–1.0)
MONOS PCT: 5 %
NEUTROS ABS: 2.3 10*3/uL (ref 1.7–7.7)
NEUTROS PCT: 42 %
Platelets: 185 10*3/uL (ref 150–400)
RBC: 4.96 MIL/uL (ref 3.87–5.11)
RDW: 14 % (ref 11.5–15.5)
WBC: 5.4 10*3/uL (ref 4.0–10.5)

## 2017-04-12 ENCOUNTER — Ambulatory Visit (HOSPITAL_COMMUNITY): Payer: Medicare Other | Admitting: Anesthesiology

## 2017-04-12 ENCOUNTER — Encounter (HOSPITAL_COMMUNITY): Payer: Self-pay | Admitting: *Deleted

## 2017-04-12 ENCOUNTER — Ambulatory Visit (HOSPITAL_COMMUNITY)
Admission: RE | Admit: 2017-04-12 | Discharge: 2017-04-12 | Disposition: A | Discharge status: Home / Self Care | Payer: Medicare Other | Source: Ambulatory Visit | Attending: Ophthalmology | Admitting: Ophthalmology

## 2017-04-12 ENCOUNTER — Encounter (HOSPITAL_COMMUNITY)
Admission: RE | Disposition: A | Discharge status: Home / Self Care | Payer: Self-pay | Source: Ambulatory Visit | Attending: Ophthalmology

## 2017-04-12 DIAGNOSIS — Z880 Allergy status to penicillin: Secondary | ICD-10-CM | POA: Insufficient documentation

## 2017-04-12 DIAGNOSIS — H268 Other specified cataract: Secondary | ICD-10-CM | POA: Insufficient documentation

## 2017-04-12 DIAGNOSIS — K219 Gastro-esophageal reflux disease without esophagitis: Secondary | ICD-10-CM | POA: Diagnosis not present

## 2017-04-12 DIAGNOSIS — I10 Essential (primary) hypertension: Secondary | ICD-10-CM | POA: Insufficient documentation

## 2017-04-12 DIAGNOSIS — I69398 Other sequelae of cerebral infarction: Secondary | ICD-10-CM | POA: Diagnosis not present

## 2017-04-12 DIAGNOSIS — F17200 Nicotine dependence, unspecified, uncomplicated: Secondary | ICD-10-CM | POA: Insufficient documentation

## 2017-04-12 HISTORY — PX: CATARACT EXTRACTION W/PHACO: SHX586

## 2017-04-12 SURGERY — PHACOEMULSIFICATION, CATARACT, WITH IOL INSERTION
Anesthesia: Monitor Anesthesia Care | Site: Eye | Laterality: Left

## 2017-04-12 MED ORDER — BSS IO SOLN
INTRAOCULAR | Status: DC | PRN
  Administered 2017-04-12: 15 mL

## 2017-04-12 MED ORDER — LIDOCAINE HCL (PF) 1 % IJ SOLN
INTRAOCULAR | Status: DC | PRN
  Administered 2017-04-12: 1 mL

## 2017-04-12 MED ORDER — NEOMYCIN-POLYMYXIN-DEXAMETH 3.5-10000-0.1 OP SUSP
OPHTHALMIC | Status: DC | PRN
  Administered 2017-04-12: 2 [drp] via OPHTHALMIC

## 2017-04-12 MED ORDER — MIDAZOLAM HCL 2 MG/2ML IJ SOLN
INTRAMUSCULAR | Status: AC
  Filled 2017-04-12: qty 2

## 2017-04-12 MED ORDER — LIDOCAINE HCL 3.5 % OP GEL
1.0000 "application " | Freq: Once | OPHTHALMIC | Status: AC
  Administered 2017-04-12: 1 via OPHTHALMIC

## 2017-04-12 MED ORDER — PHENYLEPHRINE HCL 2.5 % OP SOLN
1.0000 [drp] | OPHTHALMIC | Status: AC
Start: 2017-04-12 — End: 2017-04-12
  Administered 2017-04-12 (×3): 1 [drp] via OPHTHALMIC

## 2017-04-12 MED ORDER — TETRACAINE HCL 0.5 % OP SOLN
1.0000 [drp] | OPHTHALMIC | Status: AC
  Administered 2017-04-12 (×3): 1 [drp] via OPHTHALMIC

## 2017-04-12 MED ORDER — EPINEPHRINE PF 1 MG/ML IJ SOLN
INTRAOCULAR | Status: DC | PRN
  Administered 2017-04-12: 500 mL

## 2017-04-12 MED ORDER — LACTATED RINGERS IV SOLN
INTRAVENOUS | Status: DC
  Administered 2017-04-12: 09:00:00 via INTRAVENOUS

## 2017-04-12 MED ORDER — CYCLOPENTOLATE-PHENYLEPHRINE 0.2-1 % OP SOLN
1.0000 [drp] | OPHTHALMIC | Status: AC
  Administered 2017-04-12 (×3): 1 [drp] via OPHTHALMIC

## 2017-04-12 MED ORDER — PROVISC 10 MG/ML IO SOLN
INTRAOCULAR | Status: DC | PRN
  Administered 2017-04-12: 0.85 mL via INTRAOCULAR

## 2017-04-12 MED ORDER — MIDAZOLAM HCL 2 MG/2ML IJ SOLN
1.0000 mg | INTRAMUSCULAR | Status: AC
  Administered 2017-04-12: 2 mg via INTRAVENOUS

## 2017-04-12 MED ORDER — SODIUM HYALURONATE 23 MG/ML IO SOLN
INTRAOCULAR | Status: DC | PRN
  Administered 2017-04-12: 0.6 mL via INTRAOCULAR

## 2017-04-12 MED ORDER — POVIDONE-IODINE 5 % OP SOLN
OPHTHALMIC | Status: DC | PRN
  Administered 2017-04-12: 1 via OPHTHALMIC

## 2017-04-12 MED ORDER — FENTANYL CITRATE (PF) 100 MCG/2ML IJ SOLN
INTRAMUSCULAR | Status: AC
  Filled 2017-04-12: qty 2

## 2017-04-12 MED ORDER — FENTANYL CITRATE (PF) 100 MCG/2ML IJ SOLN
25.0000 ug | Freq: Once | INTRAMUSCULAR | Status: AC
  Administered 2017-04-12: 25 ug via INTRAVENOUS

## 2017-04-12 SURGICAL SUPPLY — 14 items
CLOTH BEACON ORANGE TIMEOUT ST (SAFETY) ×3 IMPLANT
EYE SHIELD UNIVERSAL CLEAR (GAUZE/BANDAGES/DRESSINGS) ×3 IMPLANT
GLOVE BIOGEL PI IND STRL 6.5 (GLOVE) ×1 IMPLANT
GLOVE BIOGEL PI IND STRL 7.0 (GLOVE) ×1 IMPLANT
GLOVE BIOGEL PI INDICATOR 6.5 (GLOVE) ×2
GLOVE BIOGEL PI INDICATOR 7.0 (GLOVE) ×2
LENS ALC ACRYL/TECN (Ophthalmic Related) ×3 IMPLANT
NEEDLE HYPO 18GX1.5 BLUNT FILL (NEEDLE) ×3 IMPLANT
PAD ARMBOARD 7.5X6 YLW CONV (MISCELLANEOUS) ×3 IMPLANT
SYR TB 1ML LL NO SAFETY (SYRINGE) ×3 IMPLANT
TAPE SURG TRANSPORE 1 IN (GAUZE/BANDAGES/DRESSINGS) ×1 IMPLANT
TAPE SURGICAL TRANSPORE 1 IN (GAUZE/BANDAGES/DRESSINGS) ×2
VISCOELASTIC ADDITIONAL (OPHTHALMIC RELATED) ×3 IMPLANT
WATER STERILE IRR 250ML POUR (IV SOLUTION) ×3 IMPLANT

## 2017-04-12 NOTE — Anesthesia Postprocedure Evaluation (Signed)
Anesthesia Post Note  Patient: [name]  Procedure(s) Performed: CATARACT EXTRACTION PHACO AND INTRAOCULAR LENS PLACEMENT ([place]) (Left Eye)  Patient location during evaluation: Short Stay Anesthesia Type: MAC Level of consciousness: awake and alert and oriented Pain management: pain level controlled Vital Signs Assessment: post-procedure vital signs reviewed and stable Respiratory status: spontaneous breathing Cardiovascular status: blood pressure returned to baseline Postop Assessment: no apparent nausea or vomiting Anesthetic complications: no     Last Vitals:  Vitals:   04/12/17 0900 04/12/17 0915  BP: (!) 171/71   Resp: 15 20  Temp:    SpO2:  99%    Last Pain:  Vitals:   04/12/17 0844  TempSrc: Oral                 [name]

## 2017-04-12 NOTE — Anesthesia Preprocedure Evaluation (Signed)
Anesthesia Evaluation  Patient identified by MRN, date of birth, ID band Patient awake    Reviewed: Allergy & Precautions, H&P , NPO status , Patient's Chart, lab work & pertinent test results  History of Anesthesia Complications Negative for: history of anesthetic complications  Airway Mallampati: I  TM Distance: >3 FB Neck ROM: Full    Dental  (+) Edentulous Upper, Edentulous Lower   Pulmonary Current Smoker,    Pulmonary exam normal breath sounds clear to auscultation       Cardiovascular hypertension, Pt. on medications Normal cardiovascular exam Rhythm:Regular Rate:Normal  9/13 ECHO: normal LVF, EF 65-70%, valves OK   Neuro/Psych  Headaches, CVA (R sided symptoms, mostly resolved), Residual Symptoms negative psych ROS   GI/Hepatic Neg liver ROS, GERD  Medicated and Controlled,(+) Hepatitis -  Endo/Other  negative endocrine ROS  Renal/GU negative Renal ROS     Musculoskeletal   Abdominal   Peds  Hematology   Anesthesia Other Findings   Reproductive/Obstetrics                             Anesthesia Physical Anesthesia Plan  ASA: III  Anesthesia Plan: MAC   Post-op Pain Management:    Induction: Intravenous  PONV Risk Score and Plan:   Airway Management Planned: Nasal Cannula  Additional Equipment:   Intra-op Plan:   Post-operative Plan:   Informed Consent: I have reviewed the patients History and Physical, chart, labs and discussed the procedure including the risks, benefits and alternatives for the proposed anesthesia with the patient or authorized representative who has indicated his/her understanding and acceptance.     Plan Discussed with:   Anesthesia Plan Comments:         Anesthesia Quick Evaluation

## 2017-04-12 NOTE — H&P (Signed)
The H and P was reviewed and updated. The patient was examined.  No changes were found after exam.  The surgical eye was marked.  

## 2017-04-12 NOTE — Op Note (Signed)
Date of procedure: 04/12/17  Pre-operative diagnosis: Visually significant cataract, Left Eye  Post-operative diagnosis: Visually significant cataract, Left Eye  Procedure: Removal of cataract via phacoemulsification and insertion of intra-ocular lens AMO PCB00  +21.5D into the capsular bag of the Left Eye  Attending surgeon: Gerda Diss. Guinn Delarosa, MD, MA  Anesthesia: MAC, Topical Akten  Complications: None  Estimated Blood Loss: <66m (minimal)  Specimens: None  Implants: As above  Indications:  Visually significant cataract, Left Eye  Procedure:  The patient was seen and identified in the pre-operative area. The operative eye was identified and dilated.  The operative eye was marked.  Topical anesthesia was administered to the operative eye.     The patient was then to the operative suite and placed in the supine position.  A timeout was performed confirming the patient, procedure to be performed, and all other relevant information.   The patient's face was prepped and draped in the usual fashion for intra-ocular surgery.  A lid speculum was placed into the operative eye and the surgical microscope moved into place and focused.  A superotemporal paracentesis was created using a 20 gauge paracentesis blade.  Shugarcaine was injected into the anterior chamber.  Viscoelastic was injected into the anterior chamber.  A temporal clear-corneal main wound incision was created using a 2.475mmicrokeratome.  A continuous curvilinear capsulorrhexis was initiated using an irrigating cystitome and completed using capsulorrhexis forceps.  Hydrodissection and hydrodeliniation were performed.  Viscoelastic was injected into the anterior chamber.  A phacoemulsification handpiece and a chopper as a second instrument were used to remove the nucleus and epinucleus. The irrigation/aspiration handpiece was used to remove any remaining cortical material.   The capsular bag was reinflated with viscoelastic, checked,  and found to be intact.  The intraocular lens was inserted into the capsular bag and dialed into place using a Kuglen hook.  The irrigation/aspiration handpiece was used to remove any remaining viscoelastic.  The clear corneal wound and paracentesis wounds were then hydrated and checked with Weck-Cels to be watertight.  The lid-speculum and drape was removed, and the patient's face was cleaned with a wet and dry 4x4.  Maxitrol was instilled in the eye before a clear shield was taped over the eye. The patient was taken to the post-operative care unit in good condition, having tolerated the procedure well.  Post-Op Instructions: [facility] and will receive all other orders and instructions.

## 2017-04-12 NOTE — Transfer of Care (Signed)
Immediate Anesthesia Transfer of Care Note  Patient: [name]  Procedure(s) Performed: CATARACT EXTRACTION PHACO AND INTRAOCULAR LENS PLACEMENT (IOC) (Left Eye)  Patient Location: Short Stay  Anesthesia Type:MAC  Level of Consciousness: awake  Airway & Oxygen Therapy: Patient Spontanous Breathing  Post-op Assessment: Report given to RN  Post vital signs: Reviewed  Last Vitals:  Vitals:   04/12/17 0900 04/12/17 0915  BP: (!) 171/71   Resp: 15 20  Temp:    SpO2:  99%    Last Pain:  Vitals:   04/12/17 0844  TempSrc: Oral      Patients Stated Pain Goal: 7 (52/84/13 2440)  Complications: No apparent anesthesia complications

## 2017-04-12 NOTE — Discharge Instructions (Signed)
Please discharge patient when stable, will follow up today with Dr. Wrzosek at the [facility] office immediately following discharge.  Leave shield in place until visit.  All paperwork with discharge instructions will be given at the office. ° ° °Moderate Conscious Sedation, Adult, Care After °These instructions provide you with information about caring for yourself after your procedure. Your health care provider may also give you more specific instructions. Your treatment has been planned according to current medical practices, but problems sometimes occur. Call your health care provider if you have any problems or questions after your procedure. °What can I expect after the procedure? °After your procedure, it is common: °· To feel sleepy for several hours. °· To feel clumsy and have poor balance for several hours. °· To have poor judgment for several hours. °· To vomit if you eat too soon. ° °Follow these instructions at home: °For at least 24 hours after the procedure: ° °· Do not: °? Participate in activities where you could fall or become injured. °? Drive. °? Use heavy machinery. °? Drink alcohol. °? Take sleeping pills or medicines that cause drowsiness. °? Make important decisions or sign legal documents. °? Take care of children on your own. °· Rest. °Eating and drinking °· Follow the diet recommended by your health care provider. °· If you vomit: °? Drink water, juice, or soup when you can drink without vomiting. °? Make sure you have little or no nausea before eating solid foods. °General instructions °· Have a responsible adult stay with you until you are awake and alert. °· Take over-the-counter and prescription medicines only as told by your health care provider. °· If you smoke, do not smoke without supervision. °· Keep all follow-up visits as told by your health care provider. This is important. °Contact a health care provider if: °· You keep feeling nauseous or you keep vomiting. °· You  feel light-headed. °· You develop a rash. °· You have a fever. °Get help right away if: °· You have trouble breathing. °This information is not intended to replace advice given to you by your health care provider. Make sure you discuss any questions you have with your health care provider. °Document Released: 03/04/2013 Document Revised: 10/17/2015 Document Reviewed: 09/03/2015 °Elsevier Interactive Patient Education © 2018 [employer] ° ° °

## 2017-04-15 ENCOUNTER — Encounter (HOSPITAL_COMMUNITY): Payer: Self-pay | Admitting: Ophthalmology

## 2017-04-29 ENCOUNTER — Encounter (HOSPITAL_COMMUNITY)
Admission: RE | Admit: 2017-04-29 | Discharge: 2017-04-29 | Disposition: A | Discharge status: Home / Self Care | Payer: Medicare Other | Source: Ambulatory Visit | Attending: Ophthalmology | Admitting: Ophthalmology

## 2017-05-02 MED ORDER — CYCLOPENTOLATE-PHENYLEPHRINE 0.2-1 % OP SOLN
OPHTHALMIC | Status: AC
  Filled 2017-05-02: qty 2

## 2017-05-02 MED ORDER — LIDOCAINE HCL 3.5 % OP GEL
OPHTHALMIC | Status: AC
  Filled 2017-05-02: qty 1

## 2017-05-02 MED ORDER — LIDOCAINE HCL (PF) 1 % IJ SOLN
INTRAMUSCULAR | Status: AC
  Filled 2017-05-02: qty 2

## 2017-05-02 MED ORDER — NEOMYCIN-POLYMYXIN-DEXAMETH 3.5-10000-0.1 OP SUSP
OPHTHALMIC | Status: AC
  Filled 2017-05-02: qty 5

## 2017-05-02 MED ORDER — PHENYLEPHRINE HCL 2.5 % OP SOLN
OPHTHALMIC | Status: AC
  Filled 2017-05-02: qty 15

## 2017-05-02 MED ORDER — TETRACAINE HCL 0.5 % OP SOLN
OPHTHALMIC | Status: AC
  Filled 2017-05-02: qty 4

## 2017-05-03 ENCOUNTER — Encounter (HOSPITAL_COMMUNITY): Payer: Self-pay | Admitting: Anesthesiology

## 2017-05-03 ENCOUNTER — Ambulatory Visit (HOSPITAL_COMMUNITY): Payer: Medicare Other | Admitting: Anesthesiology

## 2017-05-03 ENCOUNTER — Ambulatory Visit (HOSPITAL_COMMUNITY)
Admission: RE | Admit: 2017-05-03 | Discharge: 2017-05-03 | Disposition: A | Discharge status: Home / Self Care | Payer: Medicare Other | Source: Ambulatory Visit | Attending: Ophthalmology | Admitting: Ophthalmology

## 2017-05-03 ENCOUNTER — Encounter (HOSPITAL_COMMUNITY)
Admission: RE | Disposition: A | Discharge status: Home / Self Care | Payer: Self-pay | Source: Ambulatory Visit | Attending: Ophthalmology

## 2017-05-03 DIAGNOSIS — F17200 Nicotine dependence, unspecified, uncomplicated: Secondary | ICD-10-CM | POA: Diagnosis not present

## 2017-05-03 DIAGNOSIS — Z79899 Other long term (current) drug therapy: Secondary | ICD-10-CM | POA: Diagnosis not present

## 2017-05-03 DIAGNOSIS — M1990 Unspecified osteoarthritis, unspecified site: Secondary | ICD-10-CM | POA: Insufficient documentation

## 2017-05-03 DIAGNOSIS — K219 Gastro-esophageal reflux disease without esophagitis: Secondary | ICD-10-CM | POA: Insufficient documentation

## 2017-05-03 DIAGNOSIS — I10 Essential (primary) hypertension: Secondary | ICD-10-CM | POA: Diagnosis not present

## 2017-05-03 DIAGNOSIS — H269 Unspecified cataract: Secondary | ICD-10-CM | POA: Insufficient documentation

## 2017-05-03 HISTORY — PX: CATARACT EXTRACTION W/PHACO: SHX586

## 2017-05-03 SURGERY — PHACOEMULSIFICATION, CATARACT, WITH IOL INSERTION
Anesthesia: Monitor Anesthesia Care | Site: Eye | Laterality: Right

## 2017-05-03 MED ORDER — LIDOCAINE HCL (PF) 1 % IJ SOLN
INTRAOCULAR | Status: DC | PRN
  Administered 2017-05-03: 1 mL via OPHTHALMIC

## 2017-05-03 MED ORDER — CYCLOPENTOLATE-PHENYLEPHRINE 0.2-1 % OP SOLN
1.0000 [drp] | OPHTHALMIC | Status: AC
  Administered 2017-05-03 (×3): 1 [drp] via OPHTHALMIC

## 2017-05-03 MED ORDER — POVIDONE-IODINE 5 % OP SOLN
OPHTHALMIC | Status: DC | PRN
  Administered 2017-05-03: 1 via OPHTHALMIC

## 2017-05-03 MED ORDER — NEOMYCIN-POLYMYXIN-DEXAMETH 3.5-10000-0.1 OP SUSP
OPHTHALMIC | Status: DC | PRN
  Administered 2017-05-03: 2 [drp] via OPHTHALMIC

## 2017-05-03 MED ORDER — MIDAZOLAM HCL 2 MG/2ML IJ SOLN
1.0000 mg | Freq: Once | INTRAMUSCULAR | Status: AC | PRN
  Administered 2017-05-03: 2 mg via INTRAVENOUS

## 2017-05-03 MED ORDER — ONDANSETRON 4 MG PO TBDP
4.0000 mg | ORAL_TABLET | Freq: Once | ORAL | Status: AC
  Administered 2017-05-03: 4 mg via ORAL

## 2017-05-03 MED ORDER — TETRACAINE HCL 0.5 % OP SOLN
1.0000 [drp] | OPHTHALMIC | Status: AC
  Administered 2017-05-03 (×3): 1 [drp] via OPHTHALMIC

## 2017-05-03 MED ORDER — MIDAZOLAM HCL 2 MG/2ML IJ SOLN
INTRAMUSCULAR | Status: AC
  Filled 2017-05-03: qty 2

## 2017-05-03 MED ORDER — SODIUM HYALURONATE 23 MG/ML IO SOLN
INTRAOCULAR | Status: DC | PRN
Start: 2017-05-03 — End: 2017-05-03
  Administered 2017-05-03: 0.6 mL via INTRAOCULAR

## 2017-05-03 MED ORDER — PROVISC 10 MG/ML IO SOLN
INTRAOCULAR | Status: DC | PRN
  Administered 2017-05-03: 0.85 mL via INTRAOCULAR

## 2017-05-03 MED ORDER — BSS IO SOLN
INTRAOCULAR | Status: DC | PRN
  Administered 2017-05-03: 15 mL via INTRAOCULAR

## 2017-05-03 MED ORDER — LIDOCAINE HCL 3.5 % OP GEL
1.0000 "application " | Freq: Once | OPHTHALMIC | Status: AC
  Administered 2017-05-03: 1 via OPHTHALMIC

## 2017-05-03 MED ORDER — EPINEPHRINE PF 1 MG/ML IJ SOLN
INTRAOCULAR | Status: DC | PRN
  Administered 2017-05-03: 500 mL

## 2017-05-03 MED ORDER — LACTATED RINGERS IV SOLN
INTRAVENOUS | Status: DC
  Administered 2017-05-03: 08:00:00 via INTRAVENOUS

## 2017-05-03 MED ORDER — ONDANSETRON 4 MG PO TBDP
ORAL_TABLET | ORAL | Status: AC
  Filled 2017-05-03: qty 1

## 2017-05-03 MED ORDER — PHENYLEPHRINE HCL 2.5 % OP SOLN
1.0000 [drp] | OPHTHALMIC | Status: AC
  Administered 2017-05-03 (×3): 1 [drp] via OPHTHALMIC

## 2017-05-03 SURGICAL SUPPLY — 15 items
CLOTH BEACON ORANGE TIMEOUT ST (SAFETY) ×3 IMPLANT
EYE SHIELD UNIVERSAL CLEAR (GAUZE/BANDAGES/DRESSINGS) ×3 IMPLANT
GLOVE BIOGEL PI IND STRL 6.5 (GLOVE) ×1 IMPLANT
GLOVE BIOGEL PI IND STRL 7.0 (GLOVE) ×1 IMPLANT
GLOVE BIOGEL PI INDICATOR 6.5 (GLOVE) ×2
GLOVE BIOGEL PI INDICATOR 7.0 (GLOVE) ×2
LENS ALC ACRYL/TECN (Ophthalmic Related) ×3 IMPLANT
NEEDLE HYPO 18GX1.5 BLUNT FILL (NEEDLE) ×3 IMPLANT
PAD ARMBOARD 7.5X6 YLW CONV (MISCELLANEOUS) ×3 IMPLANT
RING MALYGIN (MISCELLANEOUS) IMPLANT
SYR TB 1ML LL NO SAFETY (SYRINGE) ×3 IMPLANT
TAPE SURG TRANSPORE 1 IN (GAUZE/BANDAGES/DRESSINGS) ×1 IMPLANT
TAPE SURGICAL TRANSPORE 1 IN (GAUZE/BANDAGES/DRESSINGS) ×2
VISCOELASTIC ADDITIONAL (OPHTHALMIC RELATED) ×3 IMPLANT
WATER STERILE IRR 250ML POUR (IV SOLUTION) ×3 IMPLANT

## 2017-05-03 NOTE — Anesthesia Procedure Notes (Signed)
Procedure Name: MAC Date/Time: 05/03/2017 9:19 AM Performed by: Andree Elk Marlos Carmen A, CRNA Pre-anesthesia Checklist: Patient identified, Emergency Drugs available, Suction available, Patient being monitored and Timeout performed Oxygen Delivery Method: Nasal cannula

## 2017-05-03 NOTE — Op Note (Signed)
Date of procedure: 05/03/17  Pre-operative diagnosis: Visually significant cataract, Right Eye  Post-operative diagnosis: Visually significant cataract, Right Eye  Procedure: Removal of cataract via phacoemulsification and insertion of intra-ocular lens AMO PCB00  +22.0D into the capsular bag of the Right Eye  Attending surgeon: Gerda Diss. Laikyn Gewirtz, MD, MA  Anesthesia: MAC, Topical Akten  Complications: None  Estimated Blood Loss: <76m (minimal)  Specimens: None  Implants: As above  Indications:  Visually significant cataract, Right Eye  Procedure:  The patient was seen and identified in the pre-operative area. The operative eye was identified and dilated.  The operative eye was marked.  Topical anesthesia was administered to the operative eye.     The patient was then to the operative suite and placed in the supine position.  A timeout was performed confirming the patient, procedure to be performed, and all other relevant information.   The patient's face was prepped and draped in the usual fashion for intra-ocular surgery.  A lid speculum was placed into the operative eye and the surgical microscope moved into place and focused.  A superotemporal paracentesis was created using a 20 gauge paracentesis blade.  Shugarcaine was injected into the anterior chamber.  Viscoelastic was injected into the anterior chamber.  A temporal clear-corneal main wound incision was created using a 2.421mmicrokeratome.  A continuous curvilinear capsulorrhexis was initiated using an irrigating cystitome and completed using capsulorrhexis forceps.  Hydrodissection and hydrodeliniation were performed.  Viscoelastic was injected into the anterior chamber.  A phacoemulsification handpiece and a chopper as a second instrument were used to remove the nucleus and epinucleus. The irrigation/aspiration handpiece was used to remove any remaining cortical material.   The capsular bag was reinflated with viscoelastic,  checked, and found to be intact.  The intraocular lens was inserted into the capsular bag and dialed into place using a Kuglen hook.  The irrigation/aspiration handpiece was used to remove any remaining viscoelastic.  The clear corneal wound and paracentesis wounds were then hydrated and checked with Weck-Cels to be watertight.  The lid-speculum and drape was removed, and the patient's face was cleaned with a wet and dry 4x4.  Maxitrol was instilled in the eye before a clear shield was taped over the eye. The patient was taken to the post-operative care unit in good condition, having tolerated the procedure well.  Post-Op Instructions: [facility] and will receive all other orders and instructions.

## 2017-05-03 NOTE — Transfer of Care (Signed)
Immediate Anesthesia Transfer of Care Note  Patient: [name]  Procedure(s) Performed: CATARACT EXTRACTION PHACO AND INTRAOCULAR LENS PLACEMENT RIGHT EYE (Right Eye)  Patient Location: Short Stay  Anesthesia Type:MAC  Level of Consciousness: awake, alert , oriented and patient cooperative  Airway & Oxygen Therapy: Patient Spontanous Breathing  Post-op Assessment: Report given to RN and Post -op Vital signs reviewed and stable  Post vital signs: Reviewed and stable  Last Vitals:  Vitals:   05/03/17 0825 05/03/17 0830  BP:  (!) 170/73  Pulse:    Resp: (!) 29 (!) 23  Temp:    SpO2: 90% 96%    Last Pain:  Vitals:   05/03/17 0758  TempSrc: Oral      Patients Stated Pain Goal: 5 (32/91/91 6606)  Complications: No apparent anesthesia complications

## 2017-05-03 NOTE — Discharge Instructions (Signed)
Please discharge patient when stable, will follow up today with Dr. Maloree Uplinger at the [facility] office immediately following discharge.  Leave shield in place until visit.  All paperwork with discharge instructions will be given at the office. ° ° °PATIENT INSTRUCTIONS °POST-ANESTHESIA ° °IMMEDIATELY FOLLOWING SURGERY:  Do not drive or operate machinery for the first twenty four hours after surgery.  Do not make any important decisions for twenty four hours after surgery or while taking narcotic pain medications or sedatives.  If you develop intractable nausea and vomiting or a severe headache please notify your doctor immediately. ° °FOLLOW-UP:  Please make an appointment with your surgeon as instructed. You do not need to follow up with anesthesia unless specifically instructed to do so. ° °WOUND CARE INSTRUCTIONS (if applicable):  Keep a dry clean dressing on the anesthesia/puncture wound site if there is drainage.  Once the wound has quit draining you may leave it open to air.  Generally you should leave the bandage intact for twenty four hours unless there is drainage.  If the epidural site drains for more than 36-48 hours please call the anesthesia department. ° °QUESTIONS?:  Please feel free to call your physician or the hospital operator if you have any questions, and they will be happy to assist you.    ° ° ° °

## 2017-05-03 NOTE — Anesthesia Postprocedure Evaluation (Signed)
Anesthesia Post Note  Patient: [name]  Procedure(s) Performed: CATARACT EXTRACTION PHACO AND INTRAOCULAR LENS PLACEMENT RIGHT EYE (Right Eye)  Patient location during evaluation: Short Stay Anesthesia Type: MAC Level of consciousness: awake and alert and oriented Pain management: pain level controlled Vital Signs Assessment: post-procedure vital signs reviewed and stable Respiratory status: spontaneous breathing and respiratory function stable Cardiovascular status: stable Postop Assessment: no apparent nausea or vomiting Anesthetic complications: no     Last Vitals:  Vitals:   05/03/17 0825 05/03/17 0830  BP:  (!) 170/73  Pulse:    Resp: (!) 29 (!) 23  Temp:    SpO2: 90% 96%    Last Pain:  Vitals:   05/03/17 0758  TempSrc: Oral                 Saadia Dewitt A

## 2017-05-03 NOTE — H&P (Signed)
The H and P was reviewed and updated. The patient was examined.  No changes were found after exam.  The surgical eye was marked.  

## 2017-05-03 NOTE — Anesthesia Preprocedure Evaluation (Signed)
Anesthesia Evaluation  Patient identified by MRN, date of birth, ID band Patient awake    Airway Mallampati: I  TM Distance: >3 FB Neck ROM: Full    Dental  (+) Edentulous Upper, Edentulous Lower   Pulmonary Current Smoker,    Pulmonary exam normal breath sounds clear to auscultation       Cardiovascular Exercise Tolerance: Poor hypertension, Pt. on medications  Rhythm:Regular Rate:Normal  - Left ventricle: The cavity size was normal. Borderline septal hypertrophy present. Systolic function was vigorous. The estimated ejection fraction was in the range of 65% to 70%. Wall motion was normal; there were no regional wall motion abnormalities.  (2013)   Neuro/Psych Hx HTN related encephalopathy, cervical myelopathy CVA (slight residual symptoms), Residual Symptoms    GI/Hepatic GERD  Medicated,(+) Hepatitis -, AResolved   Endo/Other    Renal/GU      Musculoskeletal  (+) Arthritis ,   Abdominal   Peds  Hematology   Anesthesia Other Findings   Reproductive/Obstetrics                             Anesthesia Physical Anesthesia Plan  ASA: III  Anesthesia Plan: MAC   Post-op Pain Management:    Induction:   PONV Risk Score and Plan:   Airway Management Planned: Nasal Cannula  Additional Equipment:   Intra-op Plan:   Post-operative Plan:   Informed Consent: I have reviewed the patients History and Physical, chart, labs and discussed the procedure including the risks, benefits and alternatives for the proposed anesthesia with the patient or authorized representative who has indicated his/her understanding and acceptance.     Plan Discussed with: CRNA  Anesthesia Plan Comments:         Anesthesia Quick Evaluation

## 2017-05-07 ENCOUNTER — Encounter (HOSPITAL_COMMUNITY): Payer: Self-pay | Admitting: Ophthalmology

## 2017-11-15 NOTE — Congregational Nurse Program (Signed)
Congregational Nurse Program Note  Date of Encounter: 11/15/2017  Past Medical History: Past Medical History:  Diagnosis Date  . Arthritis    OA  . GERD (gastroesophageal reflux disease)   . Headache(784.0)   . Hepatitis    history of Hepatitis 20 years ago; not sure what kind  . History of gout   . Hypertension   . Hypertensive encephalopathy   . Stroke (HCC)    SLIGHT RT SIDE WEAKNESS 2001    Encounter Details: CNP Questionnaire - 11/15/17 1205      Questionnaire   Patient Status  Not Applicable    Race  Black or African American    Location Patient Served At  [employer] [id number]    Insurance  Medicare;Medicaid    Uninsured  Not Applicable    Food  No food insecurities    Housing/Utilities  Yes, have permanent housing    Transportation  No transportation needs    Interpersonal Safety  Yes, feel physically and emotionally safe where you currently live    Medication  No medication insecurities    Medical Provider  Yes    Referrals  Not Applicable    ED Visit Averted  Not Applicable    Life-Saving Intervention Made  Not Applicable     11/15/17 B/P 154/89 Pulse 84 B/P has been elevated @ Dr.'s office. Feeling better. Still problems wit voice and swallowing. Seeing a specialist for this.   Cecilie KicksLeanna Lawson, RN [phone]

## 2018-01-09 ENCOUNTER — Other Ambulatory Visit (HOSPITAL_COMMUNITY): Payer: Self-pay | Admitting: Family Medicine

## 2018-01-09 DIAGNOSIS — R531 Weakness: Secondary | ICD-10-CM

## 2018-01-09 DIAGNOSIS — Z1231 Encounter for screening mammogram for malignant neoplasm of breast: Secondary | ICD-10-CM

## 2018-01-15 ENCOUNTER — Ambulatory Visit (HOSPITAL_COMMUNITY): Payer: Medicare Other

## 2018-01-23 ENCOUNTER — Ambulatory Visit (HOSPITAL_COMMUNITY)
Admission: RE | Admit: 2018-01-23 | Discharge: 2018-01-23 | Disposition: A | Discharge status: Home / Self Care | Payer: Medicare Other | Source: Ambulatory Visit | Attending: Family Medicine | Admitting: Family Medicine

## 2018-01-23 DIAGNOSIS — R531 Weakness: Secondary | ICD-10-CM | POA: Insufficient documentation

## 2018-02-10 ENCOUNTER — Encounter (HOSPITAL_COMMUNITY): Payer: Self-pay

## 2018-02-10 ENCOUNTER — Ambulatory Visit (HOSPITAL_COMMUNITY)
Admission: RE | Admit: 2018-02-10 | Discharge: 2018-02-10 | Disposition: A | Discharge status: Home / Self Care | Payer: Medicare Other | Source: Ambulatory Visit | Attending: Family Medicine | Admitting: Family Medicine

## 2018-02-10 DIAGNOSIS — Z1231 Encounter for screening mammogram for malignant neoplasm of breast: Secondary | ICD-10-CM | POA: Diagnosis present

## 2018-12-19 NOTE — Congregational Nurse Program (Signed)
  Dept: [phone]   Congregational Nurse Program Note  Date of Encounter: 12/19/2018  Past Medical History: Past Medical History:  Diagnosis Date  . Arthritis    OA  . GERD (gastroesophageal reflux disease)   . Headache(784.0)   . Hepatitis    history of Hepatitis 20 years ago; not sure what kind  . History of gout   . Hypertension   . Hypertensive encephalopathy   . Stroke ([place])    SLIGHT RT SIDE WEAKNESS 2001    Encounter Details: CNP Questionnaire - 12/19/18 1353      Questionnaire   Patient Status  Not Applicable    Race  Black or African American    Location Patient Served At  [employer]    Uninsured  Not Applicable    Food  No food insecurities    Housing/Utilities  Yes, have permanent housing    Transportation  No transportation needs    Interpersonal Safety  Yes, feel physically and emotionally safe where you currently live    Medication  No medication insecurities    Medical Provider  Yes    Referrals  Not Applicable    ED Visit Averted  Not Applicable    Life-Saving Intervention Made  Not Applicable     11/24/4763  Check B/P .Been walking ,B/P 168/92 Pulse 80.  Advised to keep hydrated, and take meds as ordered by Dr.  .Keep out of heat.  Theron Arista RN [phone]

## 2018-12-31 ENCOUNTER — Telehealth: Payer: Self-pay | Admitting: Family Medicine

## 2018-12-31 NOTE — Telephone Encounter (Signed)
Spoke with pt regarding appt Pt ok to come in

## 2019-01-01 ENCOUNTER — Other Ambulatory Visit: Payer: Self-pay

## 2019-01-01 ENCOUNTER — Encounter: Payer: Self-pay | Admitting: Family Medicine

## 2019-01-01 ENCOUNTER — Ambulatory Visit (INDEPENDENT_AMBULATORY_CARE_PROVIDER_SITE_OTHER): Payer: Medicare Other | Admitting: Family Medicine

## 2019-01-01 ENCOUNTER — Telehealth: Payer: Self-pay | Admitting: Family Medicine

## 2019-01-01 VITALS — BP 184/82 | HR 61 | Temp 98.6°F | Ht 62.0 in | Wt 135.0 lb

## 2019-01-01 DIAGNOSIS — Z7689 Persons encountering health services in other specified circumstances: Secondary | ICD-10-CM | POA: Diagnosis not present

## 2019-01-01 DIAGNOSIS — J4489 Other specified chronic obstructive pulmonary disease: Secondary | ICD-10-CM | POA: Insufficient documentation

## 2019-01-01 DIAGNOSIS — J449 Chronic obstructive pulmonary disease, unspecified: Secondary | ICD-10-CM

## 2019-01-01 DIAGNOSIS — Z8673 Personal history of transient ischemic attack (TIA), and cerebral infarction without residual deficits: Secondary | ICD-10-CM

## 2019-01-01 DIAGNOSIS — K862 Cyst of pancreas: Secondary | ICD-10-CM | POA: Insufficient documentation

## 2019-01-01 DIAGNOSIS — I10 Essential (primary) hypertension: Secondary | ICD-10-CM | POA: Diagnosis not present

## 2019-01-01 MED ORDER — CLOPIDOGREL BISULFATE 75 MG PO TABS: 75.0000 mg | ORAL_TABLET | Freq: Every day | ORAL | 1 refills | Status: AC

## 2019-01-01 MED ORDER — SYMBICORT 160-4.5 MCG/ACT IN AERO: 2.0000 | INHALATION_SPRAY | Freq: Two times a day (BID) | RESPIRATORY_TRACT | 11 refills | Status: DC

## 2019-01-01 MED ORDER — PROAIR HFA 108 (90 BASE) MCG/ACT IN AERS: 2.0000 | INHALATION_SPRAY | Freq: Four times a day (QID) | RESPIRATORY_TRACT | 11 refills | Status: DC | PRN

## 2019-01-01 MED ORDER — AMLODIPINE BESYLATE 10 MG PO TABS: 10.0000 mg | ORAL_TABLET | Freq: Every day | ORAL | 1 refills | Status: DC

## 2019-01-01 MED ORDER — HYDROCHLOROTHIAZIDE 25 MG PO TABS: 25.0000 mg | ORAL_TABLET | Freq: Every day | ORAL | 3 refills | Status: DC

## 2019-01-01 NOTE — Progress Notes (Signed)
Subjective:  Patient ID: [name], female    DOB: [birth year], 78 y.o.   MRN: [id number]  Patient Care Team: Claretta Fraise, MD as PCP - General (Family Medicine)   Chief Complaint:  Establish Care and Hypertension   HPI: [name] is a 78 y.o. female presenting on 01/01/2019 for Establish Care and Hypertension   1. Encounter to establish care  Pt presents today to establish care. Pt was seeing Dr. Edrick Oh who has retired. Pt states she is doing fairly well overall. States she has COPD and is out of her inhalers. States she uses her Symbicort daily and her PRN SABA when needed. States she does not need to use the SABA often. She denies chest pain, shortness of breath, fatigue or weakness. States her main symptom is cough. Pt still smoked 1/2 - 1 PPD.  She does have high blood pressure with a history of previous TIA. She is compliant with medications without associated side effects. No chest pain, leg swelling, shortness of breath, palpitations, headache, or confusion. States BP remains elevated all of the time. States runs in the 190/90's most of the time.  Tries to watch diet. Walks at least 3 miles per day.      Relevant past medical, surgical, family, and social history reviewed and updated as indicated.  Allergies and medications reviewed and updated. Date reviewed: Chart in Epic.   Past Medical History:  Diagnosis Date  . Arthritis    OA  . GERD (gastroesophageal reflux disease)   . Headache(784.0)   . Hepatitis    history of Hepatitis 20 years ago; not sure what kind  . History of gout   . Hypertension   . Hypertensive encephalopathy   . Stroke ([place])    SLIGHT RT SIDE WEAKNESS 2001    Past Surgical History:  Procedure Laterality Date  . ABDOMINAL HYSTERECTOMY    . ANTERIOR CERVICAL CORPECTOMY  03/07/2012   Procedure: ANTERIOR CERVICAL CORPECTOMY;  Surgeon: Kristeen Miss, MD;  Location: [facility];  Service: Neurosurgery;  Laterality: N/A;  Cervical  six-seven, cervical seven-thoracic one Anterior cervical decompression/diskectomy/fusion, with Cervical seven Corpectomy, reconstruction using Allograft and Alphatec plate  . CATARACT EXTRACTION W/PHACO Left 04/12/2017   Procedure: CATARACT EXTRACTION PHACO AND INTRAOCULAR LENS PLACEMENT (IOC);  Surgeon: Baruch Goldmann, MD;  Location: [facility];  Service: Ophthalmology;  Laterality: Left;  CDE: 3.82  . CATARACT EXTRACTION W/PHACO Right 05/03/2017   Procedure: CATARACT EXTRACTION PHACO AND INTRAOCULAR LENS PLACEMENT RIGHT EYE;  Surgeon: Baruch Goldmann, MD;  Location: [facility];  Service: Ophthalmology;  Laterality: Right;  CDE: 3.89  . MULTIPLE TOOTH EXTRACTIONS    . TONSILLECTOMY      Social History   Socioeconomic History  . Marital status: Widowed    Spouse name: Not on file  . Number of children: 5  . Years of education: Not on file  . Highest education level: Not on file  Occupational History  . Occupation: retired  Scientific laboratory technician  . Financial resource strain: Not on file  . Food insecurity    Worry: Not on file    Inability: Not on file  . Transportation needs    Medical: Not on file    Non-medical: Not on file  Tobacco Use  . Smoking status: Current Some Day Smoker    Packs/day: 0.50    Years: 50.00    Pack years: 25.00    Types: Cigarettes  . Smokeless tobacco: Never Used  Substance and Sexual  Activity  . Alcohol use: No  . Drug use: No  . Sexual activity: Not Currently    Birth control/protection: Surgical  Lifestyle  . Physical activity    Days per week: Not on file    Minutes per session: Not on file  . Stress: Not on file  Relationships  . Social Herbalist on phone: Not on file    Gets together: Not on file    Attends religious service: Not on file    Active member of club or organization: Not on file    Attends meetings of clubs or organizations: Not on file    Relationship status: Not on file  . Intimate partner violence    Fear of current or ex  partner: Not on file    Emotionally abused: Not on file    Physically abused: Not on file    Forced sexual activity: Not on file  Other Topics Concern  . Not on file  Social History Narrative  . Not on file    Outpatient Encounter Medications as of 01/01/2019  Medication Sig  . amLODipine (NORVASC) 10 MG tablet Take 1 tablet (10 mg total) by mouth daily.  . clopidogrel (PLAVIX) 75 MG tablet Take 1 tablet (75 mg total) by mouth daily.  . meloxicam (MOBIC) 15 MG tablet Take 15 mg by mouth at bedtime.   [name] PROAIR HFA 108 (90 Base) MCG/ACT inhaler Inhale 2 puffs into the lungs every 6 (six) hours as needed.  . SYMBICORT 160-4.5 MCG/ACT inhaler Inhale 2 puffs into the lungs 2 (two) times daily.  . [DISCONTINUED] amLODipine (NORVASC) 10 MG tablet Take 10 mg by mouth daily.  . [DISCONTINUED] clopidogrel (PLAVIX) 75 MG tablet Take 75 mg daily by mouth.  . [DISCONTINUED] PROAIR HFA 108 (90 Base) MCG/ACT inhaler Inhale 1 Inhaler into the lungs every 6 (six) hours as needed.  . [DISCONTINUED] SYMBICORT 160-4.5 MCG/ACT inhaler Inhale 2 puffs into the lungs 2 (two) times daily.  . hydrochlorothiazide (HYDRODIURIL) 25 MG tablet Take 1 tablet (25 mg total) by mouth daily.  . [DISCONTINUED] ketorolac (ACULAR) 0.5 % ophthalmic solution Place 1 drop into the left eye 2 (two) times daily.   . [DISCONTINUED] mirabegron ER (MYRBETRIQ) 25 MG TB24 tablet Take 25 mg by mouth daily.  . [DISCONTINUED] moxifloxacin (VIGAMOX) 0.5 % ophthalmic solution Place 1 drop into the right eye See admin instructions. Begin 2 days prior to surgery, place 1 drop in right eye 3 times daily and continue as directed  . [DISCONTINUED] pantoprazole (PROTONIX) 40 MG tablet Take 40 mg daily by mouth.   . [DISCONTINUED] pravastatin (PRAVACHOL) 80 MG tablet Take 80 mg every evening by mouth.   . [DISCONTINUED] prednisoLONE acetate (PRED FORTE) 1 % ophthalmic suspension Place 1 drop into the left eye 2 (two) times daily.    No  facility-administered encounter medications on file as of 01/01/2019.     Allergies  Allergen Reactions  . Penicillins Anaphylaxis, Swelling, Rash and Other (See Comments)    Has patient had a PCN reaction causing immediate rash, facial/tongue/throat swelling, SOB or lightheadedness with hypotension: yes Has patient had a PCN reaction causing severe rash involving mucus membranes or skin necrosis: no Has patient had a PCN reaction that required hospitalization: yes Has patient had a PCN reaction occurring within the last 10 years: no If all of the above answers are "NO", then may proceed with Cephalosporin use.     Review of Systems  Constitutional: Negative for  activity change, appetite change, chills, diaphoresis, fatigue, fever and unexpected weight change.  Eyes: Negative for photophobia and visual disturbance.  Respiratory: Positive for cough. Negative for chest tightness, shortness of breath and wheezing.   Cardiovascular: Negative for chest pain, palpitations and leg swelling.  Gastrointestinal: Negative for abdominal distention, abdominal pain, constipation, diarrhea, nausea and vomiting.  Endocrine: Negative for polydipsia, polyphagia and polyuria.  Genitourinary: Negative for decreased urine volume and difficulty urinating.  Musculoskeletal: Negative for arthralgias and myalgias.  Skin: Negative for color change and pallor.  Neurological: Positive for facial asymmetry (chronic, slight left facial droop). Negative for dizziness, tremors, seizures, syncope, speech difficulty, weakness, light-headedness, numbness and headaches.  Hematological: Does not bruise/bleed easily.  Psychiatric/Behavioral: Negative for confusion.  All other systems reviewed and are negative.       Objective:  BP (!) 184/82   Pulse 61   Temp 98.6 F (37 C)   Ht 5' 2"  (1.575 m)   Wt 135 lb (61.2 kg)   BMI 24.69 kg/m    Wt Readings from Last 3 Encounters:  01/01/19 135 lb (61.2 kg)  04/10/17 147  lb (66.7 kg)  02/10/16 160 lb (72.6 kg)    Physical Exam Vitals signs and nursing note reviewed.  Constitutional:      General: She is not in acute distress.    Appearance: Normal appearance. She is well-developed and well-groomed. She is not ill-appearing, toxic-appearing or diaphoretic.  HENT:     Head: Normocephalic and atraumatic.     Jaw: There is normal jaw occlusion.     Right Ear: Hearing normal.     Left Ear: Hearing normal.     Nose: Nose normal.     Mouth/Throat:     Lips: Pink.     Mouth: Mucous membranes are moist.     Pharynx: Oropharynx is clear. Uvula midline.  Eyes:     General: Lids are normal.     Extraocular Movements: Extraocular movements intact.     Conjunctiva/sclera: Conjunctivae normal.     Pupils: Pupils are equal, round, and reactive to light.  Neck:     Musculoskeletal: Normal range of motion and neck supple.     Thyroid: No thyroid mass, thyromegaly or thyroid tenderness.     Vascular: No carotid bruit or JVD.     Trachea: Trachea and phonation normal.  Cardiovascular:     Rate and Rhythm: Normal rate and regular rhythm.     Chest Wall: PMI is not displaced.     Pulses: Normal pulses.     Heart sounds: Normal heart sounds. No murmur. No friction rub. No gallop.   Pulmonary:     Effort: Pulmonary effort is normal. No respiratory distress.     Breath sounds: Normal breath sounds. No wheezing.  Abdominal:     General: Bowel sounds are normal. There is no distension or abdominal bruit.     Palpations: Abdomen is soft. There is no hepatomegaly or splenomegaly.     Tenderness: There is no abdominal tenderness. There is no right CVA tenderness or left CVA tenderness.     Hernia: No hernia is present.  Musculoskeletal: Normal range of motion.     Right lower leg: No edema.     Left lower leg: No edema.  Lymphadenopathy:     Cervical: No cervical adenopathy.  Skin:    General: Skin is warm and dry.     Capillary Refill: Capillary refill takes less  than 2 seconds.  Coloration: Skin is not cyanotic, jaundiced or pale.     Findings: No rash.  Neurological:     General: No focal deficit present.     Mental Status: She is alert and oriented to person, place, and time.     Cranial Nerves: Facial asymmetry (slight left facial droop, chronic) present.     Sensory: Sensation is intact.     Motor: Motor function is intact.     Coordination: Coordination is intact.     Gait: Gait is intact.     Deep Tendon Reflexes: Reflexes are normal and symmetric.  Psychiatric:        Attention and Perception: Attention and perception normal.        Mood and Affect: Mood and affect normal.        Speech: Speech normal.        Behavior: Behavior normal. Behavior is cooperative.        Thought Content: Thought content normal.        Cognition and Memory: Cognition and memory normal.        Judgment: Judgment normal.     Results for orders placed or performed during the hospital encounter of 30/86/57  Basic metabolic panel  Result Value Ref Range   Sodium 140 135 - 145 mmol/L   Potassium 3.7 3.5 - 5.1 mmol/L   Chloride 105 101 - 111 mmol/L   CO2 30 22 - 32 mmol/L   Glucose, Bld 103 (H) 65 - 99 mg/dL   BUN 11 6 - 20 mg/dL   Creatinine, Ser 0.85 0.44 - 1.00 mg/dL   Calcium 9.1 8.9 - 10.3 mg/dL   GFR calc non Af Amer >60 >60 mL/min   GFR calc Af Amer >60 >60 mL/min   Anion gap 5 5 - 15  CBC with Differential/Platelet  Result Value Ref Range   WBC 5.4 4.0 - 10.5 K/uL   RBC 4.96 3.87 - 5.11 MIL/uL   Hemoglobin 14.8 12.0 - 15.0 g/dL   HCT 46.1 (H) 36.0 - 46.0 %   MCV 92.9 78.0 - 100.0 fL   MCH 29.8 26.0 - 34.0 pg   MCHC 32.1 30.0 - 36.0 g/dL   RDW 14.0 11.5 - 15.5 %   Platelets 185 150 - 400 K/uL   Neutrophils Relative % 42 %   Neutro Abs 2.3 1.7 - 7.7 K/uL   Lymphocytes Relative 38 %   Lymphs Abs 2.1 0.7 - 4.0 K/uL   Monocytes Relative 5 %   Monocytes Absolute 0.3 0.1 - 1.0 K/uL   Eosinophils Relative 14 %   Eosinophils Absolute 0.8  (H) 0.0 - 0.7 K/uL   Basophils Relative 1 %   Basophils Absolute 0.0 0.0 - 0.1 K/uL       Pertinent labs & imaging results that were available during my care of the patient were reviewed by me and considered in my medical decision making.  Assessment & Plan:  Locklyn was seen today for establish care and hypertension.  Diagnoses and all orders for this visit:  Encounter to establish care  Essential hypertension BP poorly controlled. Changes were made in regimen today, added HCTZ 25 mg daily. Daily blood pressure log given with instructions on how to fill out and told to bring to next visit. Gaol BP 130/80. Pt aware to report any persistent high or low readings. DASH diet and exercise encouraged. Exercise at least 150 minutes per week and increase as tolerated. Goal BMI > 25. Stress management encouraged. Smoking  cessation discussed. Avoid excessive alcohol. Avoid NSAID's. Avoid more than 2000 mg of sodium daily. Medications as prescribed. Follow up as scheduled in 2 weeks. -     amLODipine (NORVASC) 10 MG tablet; Take 1 tablet (10 mg total) by mouth daily. -     hydrochlorothiazide (HYDRODIURIL) 25 MG tablet; Take 1 tablet (25 mg total) by mouth daily. -     CMP14+EGFR -     CBC with Differential/Platelet  COPD with chronic bronchitis (HCC) Well controlled. Smoking cessation discussed. Pt trying to cut back.  -     SYMBICORT 160-4.5 MCG/ACT inhaler; Inhale 2 puffs into the lungs 2 (two) times daily. -     PROAIR HFA 108 (90 Base) MCG/ACT inhaler; Inhale 2 puffs into the lungs every 6 (six) hours as needed.  History of TIA (transient ischemic attack) Continue below.  -     clopidogrel (PLAVIX) 75 MG tablet; Take 1 tablet (75 mg total) by mouth daily.     Continue all other maintenance medications.  Follow up plan: Return in about 2 weeks (around 01/15/2019), or if symptoms worsen or fail to improve, for HTN, CMP.  Continue healthy lifestyle choices, including diet (rich in fruits,  vegetables, and lean proteins, and low in salt and simple carbohydrates) and exercise (at least 30 minutes of moderate physical activity daily).  Educational handout given for survey, COVID-19  The above assessment and management plan was discussed with the patient. The patient verbalized understanding of and has agreed to the management plan. Patient is aware to call the clinic if symptoms persist or worsen. Patient is aware when to return to the clinic for a follow-up visit. Patient educated on when it is appropriate to go to the emergency department.   Monia Pouch, FNP-C [facility] [phone] 01/01/19

## 2019-01-01 NOTE — Patient Instructions (Signed)
It was a pleasure seeing you today, Tanaka.  Information regarding what we discussed is included in this packet.  Please make an appointment to see me in 3 months.   In a few days you may receive a survey in the mail or online from [employer] regarding your visit with [place] today. Please take a moment to fill this out. Your feedback is very important to our office. It can help [place] better understand your needs as well as improve your experience and satisfaction. Thank you for taking your time to complete it. We care about you.  Because of recent events of COVID-19 ("Coronavirus"), please follow CDC recommendations:   1. Wash your hand frequently 2. Avoid touching your face 3. Stay away from people who are sick 4. If you have symptoms such as fever, cough, shortness of breath then call your healthcare provider for further guidance 5. If you are sick, STAY AT HOME, unless otherwise directed by your healthcare provider. 6. Follow directions from state and national officials regarding staying safe    Please feel free to call our office if any questions or concerns arise.  Warm Regards, Monia Pouch, FNP-C [facility] [address] [place], [place] [address] [phone]

## 2019-01-01 NOTE — Telephone Encounter (Signed)
Pharm called and aware - keep meds as were sent to them today

## 2019-01-02 ENCOUNTER — Telehealth: Payer: Self-pay | Admitting: Family Medicine

## 2019-01-02 LAB — CMP14+EGFR
ALT: 6 IU/L (ref 0–32)
AST: 21 IU/L (ref 0–40)
Albumin/Globulin Ratio: 1.8 (ref 1.2–2.2)
Albumin: 4.2 g/dL (ref 3.7–4.7)
Alkaline Phosphatase: 44 IU/L (ref 39–117)
BUN/Creatinine Ratio: 18 (ref 12–28)
BUN: 23 mg/dL (ref 8–27)
Bilirubin Total: 0.4 mg/dL (ref 0.0–1.2)
CO2: 26 mmol/L (ref 20–29)
Calcium: 9.1 mg/dL (ref 8.7–10.3)
Chloride: 103 mmol/L (ref 96–106)
Creatinine, Ser: 1.28 mg/dL — ABNORMAL HIGH (ref 0.57–1.00)
GFR calc Af Amer: 47 mL/min/{1.73_m2} — ABNORMAL LOW (ref >59)
GFR calc non Af Amer: 41 mL/min/{1.73_m2} — ABNORMAL LOW (ref >59)
Globulin, Total: 2.3 g/dL (ref 1.5–4.5)
Glucose: 97 mg/dL (ref 65–99)
Potassium: 4.3 mmol/L (ref 3.5–5.2)
Sodium: 144 mmol/L (ref 134–144)
Total Protein: 6.5 g/dL (ref 6.0–8.5)

## 2019-01-02 LAB — CBC WITH DIFFERENTIAL/PLATELET
Basophils Absolute: 0 10*3/uL (ref 0.0–0.2)
Basos: 1 %
EOS (ABSOLUTE): 0.2 10*3/uL (ref 0.0–0.4)
Eos: 4 %
Hematocrit: 47.2 % — ABNORMAL HIGH (ref 34.0–46.6)
Hemoglobin: 15.7 g/dL (ref 11.1–15.9)
Immature Grans (Abs): 0 10*3/uL (ref 0.0–0.1)
Immature Granulocytes: 0 %
Lymphocytes Absolute: 2.4 10*3/uL (ref 0.7–3.1)
Lymphs: 46 %
MCH: 30.4 pg (ref 26.6–33.0)
MCHC: 33.3 g/dL (ref 31.5–35.7)
MCV: 91 fL (ref 79–97)
Monocytes Absolute: 0.5 10*3/uL (ref 0.1–0.9)
Monocytes: 8 %
Neutrophils Absolute: 2.2 10*3/uL (ref 1.4–7.0)
Neutrophils: 41 %
Platelets: 155 10*3/uL (ref 150–450)
RBC: 5.17 x10E6/uL (ref 3.77–5.28)
RDW: 12 % (ref 11.7–15.4)
WBC: 5.3 10*3/uL (ref 3.4–10.8)

## 2019-01-02 NOTE — Telephone Encounter (Signed)
Patient was called .

## 2019-01-16 ENCOUNTER — Other Ambulatory Visit: Payer: Self-pay

## 2019-01-19 ENCOUNTER — Encounter: Payer: Self-pay | Admitting: Family Medicine

## 2019-01-19 ENCOUNTER — Ambulatory Visit (INDEPENDENT_AMBULATORY_CARE_PROVIDER_SITE_OTHER): Payer: Medicare Other | Admitting: Family Medicine

## 2019-01-19 ENCOUNTER — Other Ambulatory Visit: Payer: Self-pay

## 2019-01-19 VITALS — BP 138/78 | HR 80 | Temp 97.1°F | Ht 62.0 in | Wt 132.0 lb

## 2019-01-19 DIAGNOSIS — M5442 Lumbago with sciatica, left side: Secondary | ICD-10-CM | POA: Diagnosis not present

## 2019-01-19 DIAGNOSIS — I10 Essential (primary) hypertension: Secondary | ICD-10-CM | POA: Diagnosis not present

## 2019-01-19 MED ORDER — KETOROLAC TROMETHAMINE 30 MG/ML IJ SOLN: 30.0000 mg | Freq: Once | INTRAMUSCULAR | Status: DC

## 2019-01-19 MED ORDER — METHYLPREDNISOLONE ACETATE 40 MG/ML IJ SUSP
40.0000 mg | Freq: Once | INTRAMUSCULAR | Status: AC
  Administered 2019-01-19: 09:00:00 40 mg via INTRAMUSCULAR

## 2019-01-19 MED ORDER — KETOROLAC TROMETHAMINE 60 MG/2ML IM SOLN
30.0000 mg | Freq: Once | INTRAMUSCULAR | Status: AC
  Administered 2019-01-19: 30 mg via INTRAMUSCULAR

## 2019-01-19 NOTE — Progress Notes (Signed)
Subjective:  Patient ID: [name], female    DOB: 08-23-1940, 78 y.o.   MRN: [id number]  Patient Care Team: Baruch Gouty, FNP as PCP - General (Family Medicine)   Chief Complaint:  Hypertension (2 week follow up )   HPI: [name] is a 78 y.o. female presenting on 01/19/2019 for Hypertension (2 week follow up )   1. Essential hypertension with goal blood pressure less than 130/80  Pt following up today after initiation of HCTZ for better blood pressure control. Pt is compliant with medications without associated side effects. No confusion, weakness, shortness of breath, chest pain, palpitations, or leg swelling.    2. Acute left-sided low back pain with left-sided sciatica  Pt states she started having left sided lower back pain that radiates into her left upper leg. States this started 3-4 days ago. No injury. She does walk daily and carries a back pack when walking. No loss of function, weakness, bowel or bladder incontinence, saddle anesthesia, fever, chills, weight loss, or confusion.      Relevant past medical, surgical, family, and social history reviewed and updated as indicated.  Allergies and medications reviewed and updated. Date reviewed: Chart in Epic.   Past Medical History:  Diagnosis Date  . Arthritis    OA  . GERD (gastroesophageal reflux disease)   . Headache(784.0)   . Hepatitis    history of Hepatitis 20 years ago; not sure what kind  . History of gout   . Hypertension   . Hypertensive encephalopathy   . Stroke ([place])    SLIGHT RT SIDE WEAKNESS 2001    Past Surgical History:  Procedure Laterality Date  . ABDOMINAL HYSTERECTOMY    . ANTERIOR CERVICAL CORPECTOMY  03/07/2012   Procedure: ANTERIOR CERVICAL CORPECTOMY;  Surgeon: Kristeen Miss, MD;  Location: [facility];  Service: Neurosurgery;  Laterality: N/A;  Cervical six-seven, cervical seven-thoracic one Anterior cervical decompression/diskectomy/fusion, with Cervical seven  Corpectomy, reconstruction using Allograft and Alphatec plate  . CATARACT EXTRACTION W/PHACO Left 04/12/2017   Procedure: CATARACT EXTRACTION PHACO AND INTRAOCULAR LENS PLACEMENT (IOC);  Surgeon: Baruch Goldmann, MD;  Location: [facility];  Service: Ophthalmology;  Laterality: Left;  CDE: 3.82  . CATARACT EXTRACTION W/PHACO Right 05/03/2017   Procedure: CATARACT EXTRACTION PHACO AND INTRAOCULAR LENS PLACEMENT RIGHT EYE;  Surgeon: Baruch Goldmann, MD;  Location: [facility];  Service: Ophthalmology;  Laterality: Right;  CDE: 3.89  . MULTIPLE TOOTH EXTRACTIONS    . TONSILLECTOMY      Social History   Socioeconomic History  . Marital status: Widowed    Spouse name: Not on file  . Number of children: 5  . Years of education: Not on file  . Highest education level: Not on file  Occupational History  . Occupation: retired  Scientific laboratory technician  . Financial resource strain: Not on file  . Food insecurity    Worry: Not on file    Inability: Not on file  . Transportation needs    Medical: Not on file    Non-medical: Not on file  Tobacco Use  . Smoking status: Current Some Day Smoker    Packs/day: 0.50    Years: 50.00    Pack years: 25.00    Types: Cigarettes  . Smokeless tobacco: Never Used  Substance and Sexual Activity  . Alcohol use: No  . Drug use: No  . Sexual activity: Not Currently    Birth control/protection: Surgical  Lifestyle  . Physical activity  Days per week: Not on file    Minutes per session: Not on file  . Stress: Not on file  Relationships  . Social Herbalist on phone: Not on file    Gets together: Not on file    Attends religious service: Not on file    Active member of club or organization: Not on file    Attends meetings of clubs or organizations: Not on file    Relationship status: Not on file  . Intimate partner violence    Fear of current or ex partner: Not on file    Emotionally abused: Not on file    Physically abused: Not on file    Forced sexual  activity: Not on file  Other Topics Concern  . Not on file  Social History Narrative  . Not on file    Outpatient Encounter Medications as of 01/19/2019  Medication Sig  . amLODipine (NORVASC) 10 MG tablet Take 1 tablet (10 mg total) by mouth daily.  . clopidogrel (PLAVIX) 75 MG tablet Take 1 tablet (75 mg total) by mouth daily.  . hydrochlorothiazide (HYDRODIURIL) 25 MG tablet Take 1 tablet (25 mg total) by mouth daily.  . meloxicam (MOBIC) 15 MG tablet Take 15 mg by mouth at bedtime.   [name] PROAIR HFA 108 (90 Base) MCG/ACT inhaler Inhale 2 puffs into the lungs every 6 (six) hours as needed.  . SYMBICORT 160-4.5 MCG/ACT inhaler Inhale 2 puffs into the lungs 2 (two) times daily.   Facility-Administered Encounter Medications as of 01/19/2019  Medication  . ketorolac (TORADOL) 30 MG/ML injection 30 mg  . methylPREDNISolone acetate (DEPO-MEDROL) injection 40 mg    Allergies  Allergen Reactions  . Penicillins Anaphylaxis, Swelling, Rash and Other (See Comments)    Has patient had a PCN reaction causing immediate rash, facial/tongue/throat swelling, SOB or lightheadedness with hypotension: yes Has patient had a PCN reaction causing severe rash involving mucus membranes or skin necrosis: no Has patient had a PCN reaction that required hospitalization: yes Has patient had a PCN reaction occurring within the last 10 years: no If all of the above answers are "NO", then may proceed with Cephalosporin use.     Review of Systems  Constitutional: Negative for activity change, appetite change, chills, fatigue and fever.  HENT: Negative.   Eyes: Negative.  Negative for photophobia and visual disturbance.  Respiratory: Negative for cough, chest tightness and shortness of breath.   Cardiovascular: Negative for chest pain, palpitations and leg swelling.  Gastrointestinal: Negative for abdominal distention, abdominal pain, anal bleeding, blood in stool, constipation, diarrhea, nausea and vomiting.   Endocrine: Negative.   Genitourinary: Negative for decreased urine volume, difficulty urinating, dysuria, frequency and urgency.  Musculoskeletal: Positive for back pain and myalgias. Negative for arthralgias, gait problem, joint swelling, neck pain and neck stiffness.  Skin: Negative.  Negative for color change, pallor and wound.  Allergic/Immunologic: Negative.   Neurological: Negative for dizziness, tremors, seizures, syncope, facial asymmetry, speech difficulty, weakness, light-headedness, numbness and headaches.  Hematological: Negative.  Negative for adenopathy. Does not bruise/bleed easily.  Psychiatric/Behavioral: Negative for confusion, hallucinations, sleep disturbance and suicidal ideas.  All other systems reviewed and are negative.       Objective:  BP 138/78   Pulse 80   Temp (!) 97.1 F (36.2 C)   Ht 5' 2"  (1.575 m)   Wt 132 lb (59.9 kg)   BMI 24.14 kg/m    Wt Readings from Last 3 Encounters:  01/19/19  132 lb (59.9 kg)  01/01/19 135 lb (61.2 kg)  04/10/17 147 lb (66.7 kg)    Physical Exam Vitals signs and nursing note reviewed.  Constitutional:      General: She is not in acute distress.    Appearance: Normal appearance. She is well-developed and well-groomed. She is not ill-appearing, toxic-appearing or diaphoretic.  HENT:     Head: Normocephalic and atraumatic.     Jaw: There is normal jaw occlusion.     Right Ear: Hearing normal.     Left Ear: Hearing normal.     Nose: Nose normal.     Mouth/Throat:     Lips: Pink.     Mouth: Mucous membranes are moist.     Pharynx: Oropharynx is clear. Uvula midline.  Eyes:     General: Lids are normal.     Extraocular Movements: Extraocular movements intact.     Conjunctiva/sclera: Conjunctivae normal.     Pupils: Pupils are equal, round, and reactive to light.  Neck:     Musculoskeletal: Normal range of motion and neck supple.     Thyroid: No thyroid mass, thyromegaly or thyroid tenderness.     Vascular: No  carotid bruit or JVD.     Trachea: Trachea and phonation normal.  Cardiovascular:     Rate and Rhythm: Normal rate and regular rhythm.     Chest Wall: PMI is not displaced.     Pulses: Normal pulses.     Heart sounds: Normal heart sounds. No murmur. No friction rub. No gallop.   Pulmonary:     Effort: Pulmonary effort is normal. No respiratory distress.     Breath sounds: Normal breath sounds. No wheezing.  Abdominal:     General: Bowel sounds are normal. There is no distension or abdominal bruit.     Palpations: Abdomen is soft. There is no hepatomegaly or splenomegaly.     Tenderness: There is no abdominal tenderness. There is no right CVA tenderness or left CVA tenderness.     Hernia: No hernia is present.  Musculoskeletal: Normal range of motion.     Right hip: Normal.     Left hip: Normal.     Thoracic back: Normal.     Lumbar back: She exhibits tenderness and pain. She exhibits normal range of motion, no bony tenderness, no swelling, no edema, no deformity, no laceration, no spasm and normal pulse.       Back:     Right lower leg: No edema.     Left lower leg: No edema.  Lymphadenopathy:     Cervical: No cervical adenopathy.  Skin:    General: Skin is warm and dry.     Capillary Refill: Capillary refill takes less than 2 seconds.     Coloration: Skin is not cyanotic, jaundiced or pale.     Findings: No rash.  Neurological:     General: No focal deficit present.     Mental Status: She is alert and oriented to person, place, and time.     Cranial Nerves: Cranial nerves are intact.     Sensory: Sensation is intact.     Motor: Motor function is intact.     Coordination: Coordination is intact.     Gait: Gait is intact.     Deep Tendon Reflexes: Reflexes are normal and symmetric.  Psychiatric:        Attention and Perception: Attention and perception normal.        Mood and Affect: Mood and affect  normal.        Speech: Speech normal.        Behavior: Behavior normal.  Behavior is cooperative.        Thought Content: Thought content normal.        Cognition and Memory: Cognition and memory normal.        Judgment: Judgment normal.     Results for orders placed or performed in visit on 01/01/19  CMP14+EGFR  Result Value Ref Range   Glucose 97 65 - 99 mg/dL   BUN 23 8 - 27 mg/dL   Creatinine, Ser 1.28 (H) 0.57 - 1.00 mg/dL   GFR calc non Af Amer 41 (L) >59 mL/min/1.73   GFR calc Af Amer 47 (L) >59 mL/min/1.73   BUN/Creatinine Ratio 18 12 - 28   Sodium 144 134 - 144 mmol/L   Potassium 4.3 3.5 - 5.2 mmol/L   Chloride 103 96 - 106 mmol/L   CO2 26 20 - 29 mmol/L   Calcium 9.1 8.7 - 10.3 mg/dL   Total Protein 6.5 6.0 - 8.5 g/dL   Albumin 4.2 3.7 - 4.7 g/dL   Globulin, Total 2.3 1.5 - 4.5 g/dL   Albumin/Globulin Ratio 1.8 1.2 - 2.2   Bilirubin Total 0.4 0.0 - 1.2 mg/dL   Alkaline Phosphatase 44 39 - 117 IU/L   AST 21 0 - 40 IU/L   ALT 6 0 - 32 IU/L  CBC with Differential/Platelet  Result Value Ref Range   WBC 5.3 3.4 - 10.8 x10E3/uL   RBC 5.17 3.77 - 5.28 x10E6/uL   Hemoglobin 15.7 11.1 - 15.9 g/dL   Hematocrit 47.2 (H) 34.0 - 46.6 %   MCV 91 79 - 97 fL   MCH 30.4 26.6 - 33.0 pg   MCHC 33.3 31.5 - 35.7 g/dL   RDW 12.0 11.7 - 15.4 %   Platelets 155 150 - 450 x10E3/uL   Neutrophils 41 Not Estab. %   Lymphs 46 Not Estab. %   Monocytes 8 Not Estab. %   Eos 4 Not Estab. %   Basos 1 Not Estab. %   Neutrophils Absolute 2.2 1.4 - 7.0 x10E3/uL   Lymphocytes Absolute 2.4 0.7 - 3.1 x10E3/uL   Monocytes Absolute 0.5 0.1 - 0.9 x10E3/uL   EOS (ABSOLUTE) 0.2 0.0 - 0.4 x10E3/uL   Basophils Absolute 0.0 0.0 - 0.2 x10E3/uL   Immature Granulocytes 0 Not Estab. %   Immature Grans (Abs) 0.0 0.0 - 0.1 x10E3/uL       Pertinent labs & imaging results that were available during my care of the patient were reviewed by me and considered in my medical decision making.  Assessment & Plan:  [name] was seen today for hypertension.  Diagnoses and all orders  for this visit:  Essential hypertension with goal blood pressure less than 130/80 DASH diet and exercise. Will recheck CMP today. Continue medications as prescribed. Follow up in 3 months.  -     CMP14+EGFR  Acute left-sided low back pain with left-sided sciatica No red flags. Symptomatic care discussed. Due to kidney function, will do burst of steroids and IM toradol today vs daily oral NSAID therapy. Report any new or worsening symptoms. Follow up if not improving as anticipated.  -     methylPREDNISolone acetate (DEPO-MEDROL) injection 40 mg -     ketorolac (TORADOL) 30 MG/ML injection 30 mg     Continue all other maintenance medications.  Follow up plan: Return in about 3 months (around 04/21/2019),  or if symptoms worsen or fail to improve, for HTN.  Continue healthy lifestyle choices, including diet (rich in fruits, vegetables, and lean proteins, and low in salt and simple carbohydrates) and exercise (at least 30 minutes of moderate physical activity daily).  Educational handout given for sciatica  The above assessment and management plan was discussed with the patient. The patient verbalized understanding of and has agreed to the management plan. Patient is aware to call the clinic if symptoms persist or worsen. Patient is aware when to return to the clinic for a follow-up visit. Patient educated on when it is appropriate to go to the emergency department.   Monia Pouch, FNP-C [facility] [phone] 01/19/19

## 2019-01-19 NOTE — Patient Instructions (Signed)
Sciatica ° °Sciatica is pain, weakness, tingling, or loss of feeling (numbness) along the sciatic nerve. The sciatic nerve starts in the lower back and goes down the back of each leg. Sciatica usually goes away on its own or with treatment. Sometimes, sciatica may come back (recur). °What are the causes? °This condition happens when the sciatic nerve is pinched or has pressure put on it. This may be the result of: °· A disk in between the bones of the spine bulging out too far (herniated disk). °· Changes in the spinal disks that occur with aging. °· A condition that affects a muscle in the butt. °· Extra bone growth near the sciatic nerve. °· A break (fracture) of the area between your hip bones (pelvis). °· Pregnancy. °· Tumor. This is rare. °What increases the risk? °You are more likely to develop this condition if you: °· Play sports that put pressure or stress on the spine. °· Have poor strength and ease of movement (flexibility). °· Have had a back injury in the past. °· Have had back surgery. °· Sit for long periods of time. °· Do activities that involve bending or lifting over and over again. °· Are very overweight (obese). °What are the signs or symptoms? °Symptoms can vary from mild to very bad. They may include: °· Any of these problems in the lower back, leg, hip, or butt: °? Mild tingling, loss of feeling, or dull aches. °? Burning sensations. °? Sharp pains. °· Loss of feeling in the back of the calf or the sole of the foot. °· Leg weakness. °· Very bad back pain that makes it hard to move. °These symptoms may get worse when you cough, sneeze, or laugh. They may also get worse when you sit or stand for long periods of time. °How is this treated? °This condition often gets better without any treatment. However, treatment may include: °· Changing or cutting back on physical activity when you have pain. °· Doing exercises and stretching. °· Putting ice or heat on the affected area. °· Medicines that  help: °? To relieve pain and swelling. °? To relax your muscles. °· Shots (injections) of medicines that help to relieve pain, irritation, and swelling. °· Surgery. °Follow these instructions at home: °Medicines °· Take over-the-counter and prescription medicines only as told by your doctor. °· Ask your doctor if the medicine prescribed to you: °? Requires you to avoid driving or using heavy machinery. °? Can cause trouble pooping (constipation). You may need to take these steps to prevent or treat trouble pooping: °§ Drink enough fluids to keep your pee (urine) pale yellow. °§ Take over-the-counter or prescription medicines. °§ Eat foods that are high in fiber. These include beans, whole grains, and fresh fruits and vegetables. °§ Limit foods that are high in fat and sugar. These include fried or sweet foods. °Managing pain ° °  ° °· If told, put ice on the affected area. °? Put ice in a plastic bag. °? Place a towel between your skin and the bag. °? Leave the ice on for 20 minutes, 2-3 times a day. °· If told, put heat on the affected area. Use the heat source that your doctor tells you to use, such as a moist heat pack or a heating pad. °? Place a towel between your skin and the heat source. °? Leave the heat on for 20-30 minutes. °? Remove the heat if your skin turns bright red. This is very important if you are   unable to feel pain, heat, or cold. You may have a greater risk of getting burned. °Activity ° °· Return to your normal activities as told by your doctor. Ask your doctor what activities are safe for you. °· Avoid activities that make your symptoms worse. °· Take short rests during the day. °? When you rest for a long time, do some physical activity or stretching between periods of rest. °? Avoid sitting for a long time without moving. Get up and move around at least one time each hour. °· Exercise and stretch regularly, as told by your doctor. °· Do not lift anything that is heavier than 10 lb (4.5 kg)  while you have symptoms of sciatica. °? Avoid lifting heavy things even when you do not have symptoms. °? Avoid lifting heavy things over and over. °· When you lift objects, always lift in a way that is safe for your body. To do this, you should: °? Bend your knees. °? Keep the object close to your body. °? Avoid twisting. °General instructions °· Stay at a healthy weight. °· Wear comfortable shoes that support your feet. Avoid wearing high heels. °· Avoid sleeping on a mattress that is too soft or too hard. You might have less pain if you sleep on a mattress that is firm enough to support your back. °· Keep all follow-up visits as told by your doctor. This is important. °Contact a doctor if: °· You have pain that: °? Wakes you up when you are sleeping. °? Gets worse when you lie down. °? Is worse than the pain you have had in the past. °? Lasts longer than 4 weeks. °· You lose weight without trying. °Get help right away if: °· You cannot control when you pee (urinate) or poop (have a bowel movement). °· You have weakness in any of these areas and it gets worse: °? Lower back. °? The area between your hip bones. °? Butt. °? Legs. °· You have redness or swelling of your back. °· You have a burning feeling when you pee. °Summary °· Sciatica is pain, weakness, tingling, or loss of feeling (numbness) along the sciatic nerve. °· This condition happens when the sciatic nerve is pinched or has pressure put on it. °· Sciatica can cause pain, tingling, or loss of feeling (numbness) in the lower back, legs, hips, and butt. °· Treatment often includes rest, exercise, medicines, and putting ice or heat on the affected area. °This information is not intended to replace advice given to you by your health care provider. Make sure you discuss any questions you have with your health care provider. °Document Released: 02/21/2008 Document Revised: 06/02/2018 Document Reviewed: 06/02/2018 °Elsevier Patient Education © 2020 [employer] ° °

## 2019-01-20 LAB — CMP14+EGFR
ALT: 6 IU/L (ref 0–32)
AST: 13 IU/L (ref 0–40)
Albumin/Globulin Ratio: 1.8 (ref 1.2–2.2)
Albumin: 4.5 g/dL (ref 3.7–4.7)
Alkaline Phosphatase: 48 IU/L (ref 39–117)
BUN/Creatinine Ratio: 17 (ref 12–28)
BUN: 22 mg/dL (ref 8–27)
Bilirubin Total: 0.4 mg/dL (ref 0.0–1.2)
CO2: 27 mmol/L (ref 20–29)
Calcium: 9.1 mg/dL (ref 8.7–10.3)
Chloride: 102 mmol/L (ref 96–106)
Creatinine, Ser: 1.26 mg/dL — ABNORMAL HIGH (ref 0.57–1.00)
GFR calc Af Amer: 48 mL/min/{1.73_m2} — ABNORMAL LOW (ref >59)
GFR calc non Af Amer: 41 mL/min/{1.73_m2} — ABNORMAL LOW (ref >59)
Globulin, Total: 2.5 g/dL (ref 1.5–4.5)
Glucose: 86 mg/dL (ref 65–99)
Potassium: 3.9 mmol/L (ref 3.5–5.2)
Sodium: 143 mmol/L (ref 134–144)
Total Protein: 7 g/dL (ref 6.0–8.5)

## 2019-01-26 ENCOUNTER — Other Ambulatory Visit: Payer: Medicare Other

## 2019-01-26 ENCOUNTER — Ambulatory Visit (INDEPENDENT_AMBULATORY_CARE_PROVIDER_SITE_OTHER): Payer: Medicare Other | Admitting: *Deleted

## 2019-01-26 ENCOUNTER — Other Ambulatory Visit: Payer: Self-pay | Admitting: Family Medicine

## 2019-01-26 ENCOUNTER — Telehealth: Payer: Self-pay | Admitting: *Deleted

## 2019-01-26 ENCOUNTER — Other Ambulatory Visit: Payer: Self-pay

## 2019-01-26 ENCOUNTER — Ambulatory Visit (INDEPENDENT_AMBULATORY_CARE_PROVIDER_SITE_OTHER): Payer: Medicare Other

## 2019-01-26 VITALS — BP 130/80 | Ht 62.0 in | Wt 135.0 lb

## 2019-01-26 DIAGNOSIS — M549 Dorsalgia, unspecified: Secondary | ICD-10-CM

## 2019-01-26 DIAGNOSIS — M5442 Lumbago with sciatica, left side: Secondary | ICD-10-CM

## 2019-01-26 DIAGNOSIS — Z0000 Encounter for general adult medical examination without abnormal findings: Secondary | ICD-10-CM | POA: Diagnosis not present

## 2019-01-26 DIAGNOSIS — M47814 Spondylosis without myelopathy or radiculopathy, thoracic region: Secondary | ICD-10-CM | POA: Diagnosis not present

## 2019-01-26 DIAGNOSIS — M47816 Spondylosis without myelopathy or radiculopathy, lumbar region: Secondary | ICD-10-CM | POA: Diagnosis not present

## 2019-01-26 NOTE — Telephone Encounter (Signed)
Pt was called today for AWV and while talking, she mentioned that her back is still bothering her. She took the steroid and is not much better. She is wanting to know if she can get a xray of her back - we do have xray here today and TUES, THUR this week. Please advise and order and I will let pt know.  ( aware you are off 8/31)   She states that it is " sciatica" but also states that it hurts "all the way from her neck down"

## 2019-01-26 NOTE — Telephone Encounter (Signed)
Can have thoracic and lumbar films. I will place order.

## 2019-01-26 NOTE — Telephone Encounter (Signed)
Pt called and aware to come by for films

## 2019-01-26 NOTE — Progress Notes (Addendum)
MEDICARE ANNUAL WELLNESS VISIT  01/26/2019  Telephone Visit Disclaimer This Medicare AWV was conducted by telephone due to national recommendations for restrictions regarding the COVID-19 Pandemic (e.g. social distancing).  I verified, using two identifiers, that I am speaking with Tabitha ParisMorrice V Hickok or their authorized healthcare agent. I discussed the limitations, risks, security, and privacy concerns of performing an evaluation and management service by telephone and the potential availability of an in-person appointment in the future. The patient expressed understanding and agreed to proceed.   Subjective:  Tabitha ParisMorrice V Seabrook is a 78 y.o. female patient of Rakes, Doralee AlbinoLinda M, FNP who had a Medicare Annual Wellness Visit today via telephone. Elmore GuiseMorrice is Retired and lives with an adult companion. she has 5 children. she reports that she is socially active and does interact with friends/family regularly. she is moderately physically active and enjoys being outside and watching animals.  Patient Care Team: Sonny Mastersakes, Linda M, FNP as PCP - General (Family Medicine)  Advanced Directives 01/26/2019 04/12/2017 04/10/2017 02/10/2016 03/03/2012 02/11/2012  Does Patient Have a Medical Advance Directive? No No No No Patient does not have advance directive;Patient would not like information Patient does not have advance directive  Would patient like information on creating a medical advance directive? No - Patient declined - No - Patient declined No - patient declined information - [facility]-    Hospital Utilization Over the Past 12 Months: # of hospitalizations or ER visits: 0 # of surgeries: 0  Review of Systems    Patient reports that her overall health is worse compared to last year, she is having some recent back issues.  General ROS: negative / having some back issues - will discuss with Rakes at follow up.  Patient Reported Readings (BP, Pulse, CBG, Weight, etc) BP 130/80 Comment: home reading  Ht 5\' 2"   (1.575 m)   Wt 135 lb (61.2 kg)   BMI 24.69 kg/m    Pain Assessment       Current Medications & Allergies (verified) Allergies as of 01/26/2019      Reactions   Penicillins Anaphylaxis, Swelling, Rash, Other (See Comments)   Has patient had a PCN reaction causing immediate rash, facial/tongue/throat swelling, SOB or lightheadedness with hypotension: yes Has patient had a PCN reaction causing severe rash involving mucus membranes or skin necrosis: no Has patient had a PCN reaction that required hospitalization: yes Has patient had a PCN reaction occurring within the last 10 years: no If all of the above answers are "NO", then may proceed with Cephalosporin use.      Medication List       Accurate as of January 26, 2019  8:54 AM. If you have any questions, ask your nurse or doctor.        amLODipine 10 MG tablet Commonly known as: NORVASC Take 1 tablet (10 mg total) by mouth daily.   clopidogrel 75 MG tablet Commonly known as: PLAVIX Take 1 tablet (75 mg total) by mouth daily.   hydrochlorothiazide 25 MG tablet Commonly known as: HYDRODIURIL Take 1 tablet (25 mg total) by mouth daily.   meloxicam 15 MG tablet Commonly known as: MOBIC Take 15 mg by mouth at bedtime.   ProAir HFA 108 (90 Base) MCG/ACT inhaler Generic drug: albuterol Inhale 2 puffs into the lungs every 6 (six) hours as needed.   Symbicort 160-4.5 MCG/ACT inhaler Generic drug: budesonide-formoterol Inhale 2 puffs into the lungs 2 (two) times daily.       History (reviewed): Past  Medical History:  Diagnosis Date  . Arthritis    OA  . GERD (gastroesophageal reflux disease)   . Headache(784.0)   . Hepatitis    history of Hepatitis 20 years ago; not sure what kind  . History of gout   . Hypertension   . Hypertensive encephalopathy   . Stroke ([place])    SLIGHT RT SIDE WEAKNESS 2001   Past Surgical History:  Procedure Laterality Date  . ABDOMINAL HYSTERECTOMY    . ANTERIOR CERVICAL CORPECTOMY   03/07/2012   Procedure: ANTERIOR CERVICAL CORPECTOMY;  Surgeon: Kristeen Miss, MD;  Location: [place] [facility];  Service: Neurosurgery;  Laterality: N/A;  Cervical six-seven, cervical seven-thoracic one Anterior cervical decompression/diskectomy/fusion, with Cervical seven Corpectomy, reconstruction using Allograft and Alphatec plate  . CATARACT EXTRACTION W/PHACO Left 04/12/2017   Procedure: CATARACT EXTRACTION PHACO AND INTRAOCULAR LENS PLACEMENT (IOC);  Surgeon: Baruch Goldmann, MD;  Location: [facility];  Service: Ophthalmology;  Laterality: Left;  CDE: 3.82  . CATARACT EXTRACTION W/PHACO Right 05/03/2017   Procedure: CATARACT EXTRACTION PHACO AND INTRAOCULAR LENS PLACEMENT RIGHT EYE;  Surgeon: Baruch Goldmann, MD;  Location: [facility];  Service: Ophthalmology;  Laterality: Right;  CDE: 3.89  . MULTIPLE TOOTH EXTRACTIONS    . TONSILLECTOMY     Family History  Adopted: Yes  Problem Relation Age of Onset  . Bipolar disorder Daughter   . Heart disease Daughter   . Asthma Daughter   . Bipolar disorder Son   . Bipolar disorder Daughter   . Bipolar disorder Daughter   . Bipolar disorder Daughter   . Hypertension Daughter   . Heart disease Daughter   . Drug abuse Daughter        OD   Social History   Socioeconomic History  . Marital status: Widowed    Spouse name: Not on file  . Number of children: 5  . Years of education: Not on file  . Highest education level: Not on file  Occupational History  . Occupation: retired    Comment: Proofreader  . Financial resource strain: Not on file  . Food insecurity    Worry: Not on file    Inability: Not on file  . Transportation needs    Medical: Not on file    Non-medical: Not on file  Tobacco Use  . Smoking status: Current Some Day Smoker    Packs/day: 0.50    Years: 50.00    Pack years: 25.00    Types: Cigarettes  . Smokeless tobacco: Never Used  Substance and Sexual Activity  . Alcohol use: No  . Drug use: No  . Sexual activity: Not  Currently    Birth control/protection: Surgical  Lifestyle  . Physical activity    Days per week: Not on file    Minutes per session: Not on file  . Stress: Not on file  Relationships  . Social Herbalist on phone: Not on file    Gets together: Not on file    Attends religious service: Not on file    Active member of club or organization: Not on file    Attends meetings of clubs or organizations: Not on file    Relationship status: Not on file  Other Topics Concern  . Not on file  Social History Narrative  . Not on file    Activities of Daily Living In your present state of health, do you have any difficulty performing the following activities: 01/26/2019  Hearing? N  Vision? Y  Comment wears RX glasses  Difficulty concentrating or making decisions? N  Walking or climbing stairs? N  Dressing or bathing? N  Doing errands, shopping? N  Preparing Food and eating ? N  Using the Toilet? N  In the past six months, have you accidently leaked urine? N  Do you have problems with loss of bowel control? N  Managing your Medications? N  Managing your Finances? N  Housekeeping or managing your Housekeeping? N  Some recent data might be hidden    Patient Education/ Literacy    Exercise Current Exercise Habits: Home exercise routine, Type of exercise: walking, Time (Minutes): 45, Frequency (Times/Week): 7, Weekly Exercise (Minutes/Week): 315, Intensity: Mild, Exercise limited by: None identified  Diet Patient reports consuming 1 meals a day and 1 snack(s) a day Patient reports that her primary diet is: Regular Patient reports that she does have regular access to food.   Depression Screen PHQ 2/9 Scores 01/26/2019 01/19/2019 01/01/2019  PHQ - 2 Score 0 0 0     Fall Risk Fall Risk  01/26/2019 01/19/2019 01/01/2019  Falls in the past year? 0 0 0     Objective:  [name] seemed alert and oriented and she participated appropriately during our telephone visit.   Blood Pressure Weight BMI  BP Readings from Last 3 Encounters:  01/26/19 130/80  01/19/19 138/78  01/01/19 (!) 184/82   Wt Readings from Last 3 Encounters:  01/26/19 135 lb (61.2 kg)  01/19/19 132 lb (59.9 kg)  01/01/19 135 lb (61.2 kg)   BMI Readings from Last 1 Encounters:  01/26/19 24.69 kg/m    *Unable to obtain current vital signs, weight, and BMI due to telephone visit type  Hearing/Vision  . Lu[place]iana did not seem to have difficulty with hearing/understanding during the telephone conversation . Reports that she has had a formal eye exam by an eye care professional within the past year . Reports that she has not had a formal hearing evaluation within the past year *Unable to fully assess hearing and vision during telephone visit type  Cognitive Function: 6CIT Screen 01/26/2019  What Year? 0 points  What month? 0 points  What time? 0 points  Count back from 20 0 points  Months in reverse 2 points  Repeat phrase 0 points  Total Score 2   (Normal:0-7, Significant for Dysfunction: >8)  Normal Cognitive Function Screening: Yes   Immunization & Health Maintenance Record Immunization History  Administered Date(s) Administered  . Influenza Split 02/13/2012  . Influenza-Unspecified 03/28/2013  . Pneumococcal Conjugate-13 11/24/2014  . Pneumococcal Polysaccharide-23 07/09/2016  . Tdap 11/24/2014    Health Maintenance  Topic Date Due  . DEXA SCAN  11/03/2007  . INFLUENZA VACCINE  12/27/2018  . TETANUS/TDAP  11/23/2024  . PNA vac Low Risk Adult  Completed       Assessment  This is a routine wellness examination for [name].  Health Maintenance: Due or Overdue Health Maintenance Due  Topic Date Due  . DEXA SCAN  11/03/2007  . INFLUENZA VACCINE  12/27/2018    [name] does not need a referral for Community Assistance: Care Management:   no Social Work:    no Prescription Assistance:  no Nutrition/Diabetes Education:  no   Plan:   Personalized Goals Goals Addressed            This Visit's Progress   . Prevent falls       Stay active  Personalized Health Maintenance & Screening Recommendations  Influenza vaccine Advanced directives: power of attorney for healthcare on file- all discussed today   Lung Cancer Screening Recommended: no (Low Dose CT Chest recommended if Age 78-80 years, 30 pack-year currently smoking OR have quit w/in past 15 years) Hepatitis C Screening recommended: no HIV Screening recommended: no  Advanced Directives: Written information was not prepared per patient's request.  Referrals & Orders No orders of the defined types were placed in this encounter.   Follow-up Plan . Follow-up with Sonny Mastersakes, Linda M, FNP as planned    I have personally reviewed and noted the following in the patient's chart:   . Medical and social history . Use of alcohol, tobacco or illicit drugs  . Current medications and supplements . Functional ability and status . Nutritional status . Physical activity . Advanced directives . List of other physicians . Hospitalizations, surgeries, and ER visits in previous 12 months . Vitals . Screenings to include cognitive, depression, and falls . Referrals and appointments  In addition, I have reviewed and discussed with Tabitha ParisMorrice V Sallade certain preventive protocols, quality metrics, and best practice recommendations. A written personalized care plan for preventive services as well as general preventive health recommendations is available and can be mailed to the patient at her request.      Juwana Thoreson, Almond LintJamie Hundley  01/26/2019  I have reviewed and agree with the above AWV documentation.   Mary-Margaret Daphine DeutscherMartin, FNP

## 2019-01-26 NOTE — Patient Instructions (Signed)
  Ms. [name] , Thank you for taking time to come for your Medicare Wellness Visit. I appreciate your ongoing commitment to your health goals. Please review the following plan we discussed and let me know if I can assist you in the future.   These are the goals we discussed: Goals    . Prevent falls     Stay active         This is a list of the screening recommended for you and due dates:  Health Maintenance  Topic Date Due  . DEXA scan (bone density measurement)  11/03/2007  . Flu Shot  12/27/2018  . Tetanus Vaccine  11/23/2024  . Pneumonia vaccines  Completed

## 2019-01-30 NOTE — Congregational Nurse Program (Signed)
  Dept: [phone]   Congregational Nurse Program Note  Date of Encounter: 01/30/2019  Past Medical History: Past Medical History:  Diagnosis Date  . Arthritis    OA  . GERD (gastroesophageal reflux disease)   . Headache(784.0)   . Hepatitis    history of Hepatitis 20 years ago; not sure what kind  . History of gout   . Hypertension   . Hypertensive encephalopathy   . Stroke ([place])    SLIGHT RT SIDE WEAKNESS 2001    Encounter Details: CNP Questionnaire - 01/30/19 1200      Questionnaire   Patient Status  Not Applicable    Race  Black or African American    Location Patient Served At  [employer]    Uninsured  Not Applicable    Food  Yes, have food insecurities    Housing/Utilities  Yes, have permanent housing    Transportation  Yes, need transportation assistance   For local visitations ,walks, family assists with transporation   Interpersonal Safety  Yes, feel physically and emotionally safe where you currently live    Medication  No medication insecurities    Medical Provider  Yes    Referrals  Not Applicable    ED Visit Averted  Not Applicable    Life-Saving Intervention Made  Not Applicable      08/28/3293 12 noon  Wanting Blood pressure check  B/P 141/82 Pulse 73. Feeling good. Taking medications as ordered.  Theron Arista, RN [phone].

## 2019-02-04 ENCOUNTER — Telehealth: Payer: Self-pay | Admitting: Family Medicine

## 2019-02-04 MED ORDER — MELOXICAM 15 MG PO TABS: 15.0000 mg | ORAL_TABLET | Freq: Every day | ORAL | 0 refills | Status: DC

## 2019-02-04 NOTE — Telephone Encounter (Signed)
Refill has been sent to pharmacy.  

## 2019-02-04 NOTE — Telephone Encounter (Signed)
Patient aware.

## 2019-02-11 DIAGNOSIS — Z1231 Encounter for screening mammogram for malignant neoplasm of breast: Secondary | ICD-10-CM | POA: Diagnosis not present

## 2019-02-12 LAB — HM MAMMOGRAPHY

## 2019-03-03 ENCOUNTER — Other Ambulatory Visit: Payer: Self-pay

## 2019-03-03 DIAGNOSIS — Z20822 Contact with and (suspected) exposure to covid-19: Secondary | ICD-10-CM

## 2019-03-05 LAB — NOVEL CORONAVIRUS, NAA: SARS-CoV-2, NAA: NOT DETECTED

## 2019-03-10 ENCOUNTER — Ambulatory Visit: Payer: Self-pay | Admitting: Family Medicine

## 2019-04-20 ENCOUNTER — Other Ambulatory Visit: Payer: Self-pay

## 2019-04-21 ENCOUNTER — Encounter: Payer: Self-pay | Admitting: Family Medicine

## 2019-04-21 ENCOUNTER — Ambulatory Visit (INDEPENDENT_AMBULATORY_CARE_PROVIDER_SITE_OTHER): Payer: Medicare Other | Admitting: Family Medicine

## 2019-04-21 VITALS — BP 145/84 | HR 80 | Temp 98.0°F | Resp 20 | Ht 62.0 in | Wt 134.0 lb

## 2019-04-21 DIAGNOSIS — N1831 Chronic kidney disease, stage 3a: Secondary | ICD-10-CM

## 2019-04-21 DIAGNOSIS — I10 Essential (primary) hypertension: Secondary | ICD-10-CM

## 2019-04-21 DIAGNOSIS — N1832 Chronic kidney disease, stage 3b: Secondary | ICD-10-CM | POA: Insufficient documentation

## 2019-04-21 DIAGNOSIS — F339 Major depressive disorder, recurrent, unspecified: Secondary | ICD-10-CM

## 2019-04-21 DIAGNOSIS — F411 Generalized anxiety disorder: Secondary | ICD-10-CM

## 2019-04-21 DIAGNOSIS — J449 Chronic obstructive pulmonary disease, unspecified: Secondary | ICD-10-CM

## 2019-04-21 MED ORDER — ESCITALOPRAM OXALATE 10 MG PO TABS: 10.0000 mg | ORAL_TABLET | Freq: Every day | ORAL | 5 refills | Status: DC

## 2019-04-21 NOTE — Patient Instructions (Addendum)
Lexapro 5 mg, may increase to 10 mg if tolerating.  Call office in 2 weeks for reevaluation.    DASH Eating Plan DASH stands for "Dietary Approaches to Stop Hypertension." The DASH eating plan is a healthy eating plan that has been shown to reduce high blood pressure (hypertension). Additional health benefits may include reducing the risk of type 2 diabetes mellitus, heart disease, and stroke. The DASH eating plan may also help with weight loss.  WHAT DO I NEED TO KNOW ABOUT THE DASH EATING PLAN? For the DASH eating plan, you will follow these general guidelines:  Choose foods with a percent daily value for sodium of less than 5% (as listed on the food label).  Use salt-free seasonings or herbs instead of table salt or sea salt.  Check with your health care provider or pharmacist before using salt substitutes.  Eat lower-sodium products, often labeled as "lower sodium" or "no salt added."  Eat fresh foods.  Eat more vegetables, fruits, and low-fat dairy products.  Choose whole grains. Look for the word "whole" as the first word in the ingredient list.  Choose fish and skinless chicken or [name] more often than red meat. Limit fish, poultry, and meat to 6 oz (170 g) each day.  Limit sweets, desserts, sugars, and sugary drinks.  Choose heart-healthy fats.  Limit cheese to 1 oz (28 g) per day.  Eat more home-cooked food and less restaurant, buffet, and fast food.  Limit fried foods.  Cook foods using methods other than frying.  Limit canned vegetables. If you do use them, rinse them well to decrease the sodium.  When eating at a restaurant, ask that your food be prepared with less salt, or no salt if possible.  WHAT FOODS CAN I EAT? Seek help from a dietitian for individual calorie needs.  Grains Whole grain or whole wheat bread. Brown rice. Whole grain or whole wheat pasta. Quinoa, bulgur, and whole grain cereals. Low-sodium cereals. Corn or whole wheat flour tortillas.  Whole grain cornbread. Whole grain crackers. Low-sodium crackers.  Vegetables Fresh or frozen vegetables (raw, steamed, roasted, or grilled). Low-sodium or reduced-sodium tomato and vegetable juices. Low-sodium or reduced-sodium tomato sauce and paste. Low-sodium or reduced-sodium canned vegetables.   Fruits All fresh, canned (in natural juice), or frozen fruits.  Meat and Other Protein Products Ground beef (85% or leaner), grass-fed beef, or beef trimmed of fat. Skinless chicken or [name]. Ground chicken or [name]. Pork trimmed of fat. All fish and seafood. Eggs. Dried beans, peas, or lentils. Unsalted nuts and seeds. Unsalted canned beans.  Dairy Low-fat dairy products, such as skim or 1% milk, 2% or reduced-fat cheeses, low-fat ricotta or cottage cheese, or plain low-fat yogurt. Low-sodium or reduced-sodium cheeses.  Fats and Oils Tub margarines without trans fats. Light or reduced-fat mayonnaise and salad dressings (reduced sodium). Avocado. Safflower, olive, or canola oils. Natural peanut or almond butter.  Other Unsalted popcorn and pretzels. The items listed above may not be a complete list of recommended foods or beverages. Contact your dietitian for more options.  WHAT FOODS ARE NOT RECOMMENDED?  Grains White bread. White pasta. White rice. Refined cornbread. Bagels and croissants. Crackers that contain trans fat.  Vegetables Creamed or fried vegetables. Vegetables in a cheese sauce. Regular canned vegetables. Regular canned tomato sauce and paste. Regular tomato and vegetable juices.  Fruits Dried fruits. Canned fruit in light or heavy syrup. Fruit juice.  Meat and Other Protein Products Fatty cuts of meat. Ribs, chicken wings, bacon,  sausage, bologna, salami, chitterlings, fatback, hot dogs, bratwurst, and packaged luncheon meats. Salted nuts and seeds. Canned beans with salt.  Dairy Whole or 2% milk, cream, half-and-half, and cream cheese. Whole-fat or sweetened  yogurt. Full-fat cheeses or blue cheese. Nondairy creamers and whipped toppings. Processed cheese, cheese spreads, or cheese curds.  Condiments Onion and garlic salt, seasoned salt, table salt, and sea salt. Canned and packaged gravies. Worcestershire sauce. Tartar sauce. Barbecue sauce. Teriyaki sauce. Soy sauce, including reduced sodium. Steak sauce. Fish sauce. Oyster sauce. Cocktail sauce. Horseradish. Ketchup and mustard. Meat flavorings and tenderizers. Bouillon cubes. Hot sauce. Tabasco sauce. Marinades. Taco seasonings. Relishes.  Fats and Oils Butter, stick margarine, lard, shortening, ghee, and bacon fat. Coconut, palm kernel, or palm oils. Regular salad dressings.  Other Pickles and olives. Salted popcorn and pretzels.  The items listed above may not be a complete list of foods and beverages to avoid. Contact your dietitian for more information.  WHERE CAN I FIND MORE INFORMATION? National Heart, Lung, and Blood Institute: travelstabloid.com Document Released: 05/03/2011 Document Revised: 09/28/2013 Document Reviewed: 03/18/2013 [employer] Patient Information 2015 [place], [place]. This information is not intended to replace advice given to you by your health care provider. Make sure you discuss any questions you have with your health care provider.   I think that you would greatly benefit from seeing a nutritionist.  If you are interested, please call Dr Jenne Campus at [phone] to schedule an appointment.

## 2019-04-21 NOTE — Progress Notes (Signed)
Subjective:  Patient ID: [name], female    DOB: 1940/12/06, 78 y.o.   MRN: [id number]  Patient Care Team: Baruch Gouty, FNP as PCP - General (Family Medicine)   Chief Complaint:  Medical Management of Chronic Issues (3 mo ) and Hypertension   HPI: [name] is a 78 y.o. female presenting on 04/21/2019 for Medical Management of Chronic Issues (3 mo ) and Hypertension   1. Essential hypertension with goal blood pressure less than 130/80 Complaint with meds - Yes Current Medications - Norvasc, Hyzaar  Checking BP at home - No Exercising Regularly - No Watching Salt intake - Yes Pertinent ROS:  Headache - No Fatigue - Yes Visual Disturbances - No Chest pain - No Dyspnea - No Palpitations - No LE edema - No They report good compliance with medications and can restate their regimen by memory. No medication side effects.  Family, social, and smoking history reviewed.   BP Readings from Last 3 Encounters:  04/21/19 (!) 145/84  01/26/19 130/80  01/19/19 138/78   CMP Latest Ref Rng & Units 01/19/2019 01/01/2019 04/10/2017  Glucose 65 - 99 mg/dL 86 97 103(H)  BUN 8 - 27 mg/dL 22 23 11   Creatinine 0.57 - 1.00 mg/dL 1.26(H) 1.28(H) 0.85  Sodium 134 - 144 mmol/L 143 144 140  Potassium 3.5 - 5.2 mmol/L 3.9 4.3 3.7  Chloride 96 - 106 mmol/L 102 103 105  CO2 20 - 29 mmol/L 27 26 30   Calcium 8.7 - 10.3 mg/dL 9.1 9.1 9.1  Total Protein 6.0 - 8.5 g/dL 7.0 6.5 -  Total Bilirubin 0.0 - 1.2 mg/dL 0.4 0.4 -  Alkaline Phos 39 - 117 IU/L 48 44 -  AST 0 - 40 IU/L 13 21 -  ALT 0 - 32 IU/L 6 6 -      2. COPD with chronic bronchitis ([place]) Well controlled with current medications, no increased dyspnea. Slight shortness of breath with exertion and activity. No increased sputum production or cough. No fever, chills, weakness, or confusion,.   3. Stage 3a chronic kidney disease No decreased urinary output, swelling, confusion, weakness, fatigue, or malaise.   4.  Depression, recurrent ([place]) 5. GAD (generalized anxiety disorder) Pt reports she has had increased stress and anxiety over the last 6 months. States she had an issue with anxiety and depression about 20 years ago and got over it. States the events of this year have really upset her and increased her anxiety and depression. She feels she needs medications as the symptoms continue to get worse.   Depression screen [facility] 2/9 04/21/2019 01/26/2019 01/19/2019 01/01/2019  Decreased Interest 0 0 0 0  Down, Depressed, Hopeless 1 0 0 0  PHQ - 2 Score 1 0 0 0  Altered sleeping 3 - - -  Tired, decreased energy 2 - - -  Change in appetite 0 - - -  Feeling bad or failure about yourself  1 - - -  Trouble concentrating 0 - - -  Moving slowly or fidgety/restless 1 - - -  Suicidal thoughts 0 - - -  PHQ-9 Score 8 - - -  Difficult doing work/chores Somewhat difficult - - -   GAD 7 : Generalized Anxiety Score 04/21/2019  Nervous, Anxious, on Edge 1  Control/stop worrying 1  Worry too much - different things 1  Trouble relaxing 0  Restless 0  Easily annoyed or irritable 0  Afraid - awful might happen 1  Total GAD  7 Score 4     Relevant past medical, surgical, family, and social history reviewed and updated as indicated.  Allergies and medications reviewed and updated. Date reviewed: Chart in Epic.   Past Medical History:  Diagnosis Date  . Arthritis    OA  . GERD (gastroesophageal reflux disease)   . Headache(784.0)   . Hepatitis    history of Hepatitis 20 years ago; not sure what kind  . History of gout   . Hypertension   . Hypertensive encephalopathy   . Stroke ([place])    SLIGHT RT SIDE WEAKNESS 2001    Past Surgical History:  Procedure Laterality Date  . ABDOMINAL HYSTERECTOMY    . ANTERIOR CERVICAL CORPECTOMY  03/07/2012   Procedure: ANTERIOR CERVICAL CORPECTOMY;  Surgeon: Kristeen Miss, MD;  Location: [facility] [facility];  Service: Neurosurgery;  Laterality: N/A;  Cervical six-seven, cervical  seven-thoracic one Anterior cervical decompression/diskectomy/fusion, with Cervical seven Corpectomy, reconstruction using Allograft and Alphatec plate  . CATARACT EXTRACTION W/PHACO Left 04/12/2017   Procedure: CATARACT EXTRACTION PHACO AND INTRAOCULAR LENS PLACEMENT (IOC);  Surgeon: Baruch Goldmann, MD;  Location: [facility];  Service: Ophthalmology;  Laterality: Left;  CDE: 3.82  . CATARACT EXTRACTION W/PHACO Right 05/03/2017   Procedure: CATARACT EXTRACTION PHACO AND INTRAOCULAR LENS PLACEMENT RIGHT EYE;  Surgeon: Baruch Goldmann, MD;  Location: [facility];  Service: Ophthalmology;  Laterality: Right;  CDE: 3.89  . MULTIPLE TOOTH EXTRACTIONS    . TONSILLECTOMY      Social History   Socioeconomic History  . Marital status: Widowed    Spouse name: Not on file  . Number of children: 5  . Years of education: Not on file  . Highest education level: Not on file  Occupational History  . Occupation: retired    Comment: Proofreader  . Financial resource strain: Not on file  . Food insecurity    Worry: Not on file    Inability: Not on file  . Transportation needs    Medical: Not on file    Non-medical: Not on file  Tobacco Use  . Smoking status: Current Some Day Smoker    Packs/day: 0.50    Years: 50.00    Pack years: 25.00    Types: Cigarettes  . Smokeless tobacco: Never Used  Substance and Sexual Activity  . Alcohol use: No  . Drug use: No  . Sexual activity: Not Currently    Birth control/protection: Surgical  Lifestyle  . Physical activity    Days per week: Not on file    Minutes per session: Not on file  . Stress: Not on file  Relationships  . Social Herbalist on phone: Not on file    Gets together: Not on file    Attends religious service: Not on file    Active member of club or organization: Not on file    Attends meetings of clubs or organizations: Not on file    Relationship status: Not on file  . Intimate partner violence    Fear of current or ex  partner: Not on file    Emotionally abused: Not on file    Physically abused: Not on file    Forced sexual activity: Not on file  Other Topics Concern  . Not on file  Social History Narrative  . Not on file    Outpatient Encounter Medications as of 04/21/2019  Medication Sig  . amLODipine (NORVASC) 10 MG tablet Take 1 tablet (10 mg total) by  mouth daily.  . clopidogrel (PLAVIX) 75 MG tablet Take 75 mg by mouth daily.  [name] losartan-hydrochlorothiazide (HYZAAR) 100-25 MG tablet Take 1 tablet by mouth daily.  . meloxicam (MOBIC) 15 MG tablet Take 1 tablet (15 mg total) by mouth at bedtime.  [name] PROAIR HFA 108 (90 Base) MCG/ACT inhaler Inhale 2 puffs into the lungs every 6 (six) hours as needed.  . SYMBICORT 160-4.5 MCG/ACT inhaler Inhale 2 puffs into the lungs 2 (two) times daily.  [name] escitalopram (LEXAPRO) 10 MG tablet Take 1 tablet (10 mg total) by mouth daily.  . [DISCONTINUED] hydrochlorothiazide (HYDRODIURIL) 25 MG tablet Take 1 tablet (25 mg total) by mouth daily.   No facility-administered encounter medications on file as of 04/21/2019.     Allergies  Allergen Reactions  . Penicillins Anaphylaxis, Swelling, Rash and Other (See Comments)    Has patient had a PCN reaction causing immediate rash, facial/tongue/throat swelling, SOB or lightheadedness with hypotension: yes Has patient had a PCN reaction causing severe rash involving mucus membranes or skin necrosis: no Has patient had a PCN reaction that required hospitalization: yes Has patient had a PCN reaction occurring within the last 10 years: no If all of the above answers are "NO", then may proceed with Cephalosporin use.     Review of Systems  Constitutional: Negative for activity change, appetite change, chills, diaphoresis, fatigue, fever and unexpected weight change.  HENT: Negative.   Eyes: Negative.  Negative for photophobia and visual disturbance.  Respiratory: Negative for cough, chest tightness and shortness of  breath.   Cardiovascular: Negative for chest pain, palpitations and leg swelling.  Gastrointestinal: Negative for blood in stool, constipation, diarrhea, nausea and vomiting.  Endocrine: Negative.  Negative for cold intolerance, heat intolerance, polydipsia, polyphagia and polyuria.  Genitourinary: Negative for decreased urine volume, difficulty urinating, dysuria, frequency and urgency.  Musculoskeletal: Negative for arthralgias and myalgias.  Skin: Negative.   Allergic/Immunologic: Negative.   Neurological: Negative for dizziness, tremors, seizures, syncope, facial asymmetry, speech difficulty, weakness, light-headedness and numbness.  Hematological: Negative.   Psychiatric/Behavioral: Positive for agitation, decreased concentration, dysphoric mood and sleep disturbance. Negative for behavioral problems, confusion, hallucinations, self-injury and suicidal ideas. The patient is nervous/anxious. The patient is not hyperactive.   All other systems reviewed and are negative.       Objective:  BP (!) 145/84   Pulse 80   Temp 98 F (36.7 C)   Resp 20   Ht 5' 2"  (1.575 m)   Wt 134 lb (60.8 kg)   SpO2 95%   BMI 24.51 kg/m    Wt Readings from Last 3 Encounters:  04/21/19 134 lb (60.8 kg)  01/26/19 135 lb (61.2 kg)  01/19/19 132 lb (59.9 kg)    Physical Exam Vitals signs and nursing note reviewed.  Constitutional:      General: She is not in acute distress.    Appearance: Normal appearance. She is well-developed and well-groomed. She is not ill-appearing, toxic-appearing or diaphoretic.  HENT:     Head: Normocephalic and atraumatic.     Jaw: There is normal jaw occlusion.     Right Ear: Hearing normal.     Left Ear: Hearing normal.     Nose: Nose normal.     Mouth/Throat:     Lips: Pink.     Mouth: Mucous membranes are moist.     Pharynx: Oropharynx is clear. Uvula midline.  Eyes:     General: Lids are normal.     Extraocular Movements: Extraocular  movements intact.      Conjunctiva/sclera: Conjunctivae normal.     Pupils: Pupils are equal, round, and reactive to light.  Neck:     Musculoskeletal: Normal range of motion and neck supple.     Thyroid: No thyroid mass, thyromegaly or thyroid tenderness.     Vascular: No carotid bruit or JVD.     Trachea: Trachea and phonation normal.  Cardiovascular:     Rate and Rhythm: Normal rate and regular rhythm.     Chest Wall: PMI is not displaced.     Pulses: Normal pulses.     Heart sounds: Normal heart sounds. No murmur. No friction rub. No gallop.   Pulmonary:     Effort: Pulmonary effort is normal. No respiratory distress.     Breath sounds: Normal breath sounds. No wheezing.  Abdominal:     General: Bowel sounds are normal. There is no distension or abdominal bruit.     Palpations: Abdomen is soft. There is no hepatomegaly or splenomegaly.     Tenderness: There is no abdominal tenderness. There is no right CVA tenderness or left CVA tenderness.     Hernia: No hernia is present.  Musculoskeletal: Normal range of motion.     Right lower leg: No edema.     Left lower leg: No edema.  Lymphadenopathy:     Cervical: No cervical adenopathy.  Skin:    General: Skin is warm and dry.     Capillary Refill: Capillary refill takes less than 2 seconds.     Coloration: Skin is not cyanotic, jaundiced or pale.     Findings: No rash.  Neurological:     General: No focal deficit present.     Mental Status: She is alert and oriented to person, place, and time.     Cranial Nerves: Cranial nerves are intact. No cranial nerve deficit.     Sensory: Sensation is intact. No sensory deficit.     Motor: Motor function is intact. No weakness.     Coordination: Coordination is intact. Coordination normal.     Gait: Gait is intact. Gait normal.     Deep Tendon Reflexes: Reflexes are normal and symmetric. Reflexes normal.  Psychiatric:        Attention and Perception: Attention and perception normal.        Mood and Affect:  Affect normal. Mood is depressed.        Speech: Speech normal.        Behavior: Behavior normal. Behavior is cooperative.        Thought Content: Thought content normal.        Cognition and Memory: Cognition and memory normal.        Judgment: Judgment normal.     Results for orders placed or performed in visit on 03/03/19  Novel Coronavirus, NAA ([facility])   Specimen: Nasopharyngeal(NP) swabs in vial transport medium   NASOPHARYNGE  TESTING  Result Value Ref Range   SARS-CoV-2, NAA Not Detected Not Detected       Pertinent labs & imaging results that were available during my care of the patient were reviewed by me and considered in my medical decision making.  Assessment & Plan:  Bisma was seen today for medical management of chronic issues and hypertension.  Diagnoses and all orders for this visit:  Essential hypertension with goal blood pressure less than 130/80 BP fairly controlled. Changes were not made in regimen today. Goal BP is 130/80. Pt aware to report any persistent high  or low readings. DASH diet and exercise encouraged. Exercise at least 150 minutes per week and increase as tolerated. Goal BMI > 25. Stress management encouraged. Avoid nicotine and tobacco product use. Avoid excessive alcohol and NSAID's. Avoid more than 2000 mg of sodium daily. Medications as prescribed. Follow up as scheduled.  -     CMP14+EGFR -     CBC with Differential/Platelet -     Lipid panel -     Thyroid Panel With TSH -     Microalbumin / creatinine urine ratio  COPD with chronic bronchitis (HCC) Well controlled, continue current regimen.  -     CBC with Differential/Platelet  Stage 3a chronic kidney disease No decreased urination, swelling, confusion, or weakness. Labs pending.  -     CMP14+EGFR -     Microalbumin / creatinine urine ratio  Depression, recurrent (HCC) GAD (generalized anxiety disorder) Ongoing and worsening symptoms. Long discussion about disease process and ways  to cope and manage stress discussed. Declined referral to counselor. Will trial below. Pt to start with 5 mg daily and then increase to 10 mg if tolerated. Reevaluation in 2 weeks by phone. Follow up in office in 3 months.  -     Thyroid Panel With TSH -     escitalopram (LEXAPRO) 10 MG tablet; Take 1 tablet (10 mg total) by mouth daily.   Total time spent with patient 40 mintues.  Greater than 50% of encounter spent in coordination of care/counseling.   Continue all other maintenance medications.  Follow up plan: Return in about 3 months (around 07/22/2019), or if symptoms worsen or fail to improve, for HTN.  Continue healthy lifestyle choices, including diet (rich in fruits, vegetables, and lean proteins, and low in salt and simple carbohydrates) and exercise (at least 30 minutes of moderate physical activity daily).  Educational handout given for depression  The above assessment and management plan was discussed with the patient. The patient verbalized understanding of and has agreed to the management plan. Patient is aware to call the clinic if they develop any new symptoms or if symptoms persist or worsen. Patient is aware when to return to the clinic for a follow-up visit. Patient educated on when it is appropriate to go to the emergency department.   Monia Pouch, FNP-C [facility] [phone]

## 2019-04-22 ENCOUNTER — Ambulatory Visit: Payer: Medicare Other | Admitting: Family Medicine

## 2019-04-22 LAB — CBC WITH DIFFERENTIAL/PLATELET
Basophils Absolute: 0 10*3/uL (ref 0.0–0.2)
Basos: 1 %
EOS (ABSOLUTE): 0.1 10*3/uL (ref 0.0–0.4)
Eos: 2 %
Hematocrit: 49 % — ABNORMAL HIGH (ref 34.0–46.6)
Hemoglobin: 15.5 g/dL (ref 11.1–15.9)
Immature Grans (Abs): 0 10*3/uL (ref 0.0–0.1)
Immature Granulocytes: 0 %
Lymphocytes Absolute: 1.9 10*3/uL (ref 0.7–3.1)
Lymphs: 35 %
MCH: 30 pg (ref 26.6–33.0)
MCHC: 31.6 g/dL (ref 31.5–35.7)
MCV: 95 fL (ref 79–97)
Monocytes Absolute: 0.4 10*3/uL (ref 0.1–0.9)
Monocytes: 7 %
Neutrophils Absolute: 3 10*3/uL (ref 1.4–7.0)
Neutrophils: 55 %
Platelets: 171 10*3/uL (ref 150–450)
RBC: 5.17 x10E6/uL (ref 3.77–5.28)
RDW: 12.2 % (ref 11.7–15.4)
WBC: 5.5 10*3/uL (ref 3.4–10.8)

## 2019-04-22 LAB — MICROALBUMIN / CREATININE URINE RATIO
Creatinine, Urine: 116.7 mg/dL
Microalb/Creat Ratio: 26 mg/g creat (ref 0–29)
Microalbumin, Urine: 30.5 ug/mL

## 2019-04-22 LAB — CMP14+EGFR
ALT: 6 IU/L (ref 0–32)
AST: 14 IU/L (ref 0–40)
Albumin/Globulin Ratio: 1.6 (ref 1.2–2.2)
Albumin: 4.3 g/dL (ref 3.7–4.7)
Alkaline Phosphatase: 49 IU/L (ref 39–117)
BUN/Creatinine Ratio: 11 — ABNORMAL LOW (ref 12–28)
BUN: 12 mg/dL (ref 8–27)
Bilirubin Total: 0.4 mg/dL (ref 0.0–1.2)
CO2: 26 mmol/L (ref 20–29)
Calcium: 9.2 mg/dL (ref 8.7–10.3)
Chloride: 102 mmol/L (ref 96–106)
Creatinine, Ser: 1.08 mg/dL — ABNORMAL HIGH (ref 0.57–1.00)
GFR calc Af Amer: 58 mL/min/{1.73_m2} — ABNORMAL LOW (ref >59)
GFR calc non Af Amer: 50 mL/min/{1.73_m2} — ABNORMAL LOW (ref >59)
Globulin, Total: 2.7 g/dL (ref 1.5–4.5)
Glucose: 101 mg/dL — ABNORMAL HIGH (ref 65–99)
Potassium: 3.8 mmol/L (ref 3.5–5.2)
Sodium: 142 mmol/L (ref 134–144)
Total Protein: 7 g/dL (ref 6.0–8.5)

## 2019-04-22 LAB — LIPID PANEL
Chol/HDL Ratio: 3.2 ratio (ref 0.0–4.4)
Cholesterol, Total: 204 mg/dL — ABNORMAL HIGH (ref 100–199)
HDL: 63 mg/dL (ref >39)
LDL Chol Calc (NIH): 127 mg/dL — ABNORMAL HIGH (ref 0–99)
Triglycerides: 80 mg/dL (ref 0–149)
VLDL Cholesterol Cal: 14 mg/dL (ref 5–40)

## 2019-04-22 LAB — THYROID PANEL WITH TSH
Free Thyroxine Index: 1.9 (ref 1.2–4.9)
T3 Uptake Ratio: 27 % (ref 24–39)
T4, Total: 6.9 ug/dL (ref 4.5–12.0)
TSH: 2.83 u[IU]/mL (ref 0.450–4.500)

## 2019-07-23 ENCOUNTER — Ambulatory Visit: Payer: Medicare Other | Admitting: Family Medicine

## 2019-07-23 ENCOUNTER — Other Ambulatory Visit: Payer: Self-pay | Admitting: Family Medicine

## 2019-07-23 DIAGNOSIS — I10 Essential (primary) hypertension: Secondary | ICD-10-CM

## 2019-07-24 ENCOUNTER — Ambulatory Visit (INDEPENDENT_AMBULATORY_CARE_PROVIDER_SITE_OTHER): Payer: Medicare Other | Admitting: Family Medicine

## 2019-07-24 ENCOUNTER — Other Ambulatory Visit: Payer: Self-pay

## 2019-07-24 ENCOUNTER — Ambulatory Visit (INDEPENDENT_AMBULATORY_CARE_PROVIDER_SITE_OTHER): Payer: Medicare Other

## 2019-07-24 ENCOUNTER — Encounter: Payer: Self-pay | Admitting: Family Medicine

## 2019-07-24 VITALS — BP 114/71 | HR 81 | Temp 97.1°F | Resp 20 | Ht 62.0 in | Wt 131.0 lb

## 2019-07-24 DIAGNOSIS — F339 Major depressive disorder, recurrent, unspecified: Secondary | ICD-10-CM | POA: Diagnosis not present

## 2019-07-24 DIAGNOSIS — I10 Essential (primary) hypertension: Secondary | ICD-10-CM | POA: Diagnosis not present

## 2019-07-24 DIAGNOSIS — J449 Chronic obstructive pulmonary disease, unspecified: Secondary | ICD-10-CM

## 2019-07-24 DIAGNOSIS — N1831 Chronic kidney disease, stage 3a: Secondary | ICD-10-CM | POA: Diagnosis not present

## 2019-07-24 DIAGNOSIS — Z13820 Encounter for screening for osteoporosis: Secondary | ICD-10-CM

## 2019-07-24 DIAGNOSIS — R202 Paresthesia of skin: Secondary | ICD-10-CM | POA: Diagnosis not present

## 2019-07-24 DIAGNOSIS — Z780 Asymptomatic menopausal state: Secondary | ICD-10-CM | POA: Diagnosis not present

## 2019-07-24 DIAGNOSIS — F411 Generalized anxiety disorder: Secondary | ICD-10-CM

## 2019-07-24 MED ORDER — SERTRALINE HCL 50 MG PO TABS: 25.0000 mg | ORAL_TABLET | Freq: Every day | ORAL | 3 refills | Status: DC

## 2019-07-24 NOTE — Patient Instructions (Signed)
If your symptoms worsen or you have thoughts of suicide/homicide, PLEASE SEEK IMMEDIATE MEDICAL ATTENTION.  You may always call the National Suicide Hotline.  This is available 24 hours a day, 7 days a week.  Their number is: 1-800-273-8255  Taking the medicine as directed and not missing any doses is one of the best things you can do to treat your depression.  Here are some things to keep in mind:  1) Side effects (stomach upset, some increased anxiety) may happen before you notice a benefit.  These side effects typically go away over time. 2) Changes to your dose of medicine or a change in medication all together is sometimes necessary 3) Most people need to be on medication at least 12 months 4) Many people will notice an improvement within two weeks but the full effect of the medication can take up to 4-6 weeks 5) Stopping the medication when you start feeling better often results in a return of symptoms 6) Never discontinue your medication without contacting a health care professional first.  Some medications require gradual discontinuation/ taper and can make you sick if you stop them abruptly.  If your symptoms worsen or you have thoughts of suicide/homicide, PLEASE SEEK IMMEDIATE MEDICAL ATTENTION.  You may always call:  National Suicide Hotline: 800-273-8255 [place] Crisis Line: 336-832-9700 Crisis Recovery in [place]: 800-939-5911  These are available 24 hours a day, 7 days a week.  Your provider wants you to schedule an appointment with a Psychologist/Psychiatrist. The following list of offices requires the patient to call and make their own appointment, as there is information they need that only you can provide. Please feel free to choose form the following providers:  [facility]   [phone] Crisis Recovery in [place] [phone]  [facility]  [phone]   [address] [place], [place]  (Scheduled through [facility]) Must  call and do an interview for appointment. Sees Children / Accepts Medicaid  [facility]    [phone]  [address], [address]    [place], [place]       [facility]  [phone] [address] [place], [place]  Evaluates for Autism but does not treat it Sees Children / Accepts Medicaid  [facility]    [phone] [address], [address]   [place], [place] Medication management, substance abuse, bipolar, grief, family, marriage, OCD, anxiety, PTSD Sees children / Accepts Medicaid  [facility]    [phone] [address] [place], [place] Sees children / Accepts Medicaid  [facility]  [phone] [address] [place], [place]   Dr Akinlayo     [phone] [address] [place], [place]  Sees ADD & ADHD for treatment Accepts Medicaid  [facility]  [phone] [address] [place], [place] Evaluates for Autism Accepts Medicaid  [facility]  [phone] [address] [place], [place]  Does Adult ADD evaluations Does not accept Medicaid  [facility]   [phone] [address]   [place], [place] Uses animal therapy  Sees children as young as 3 years old Accepts Medicaid  [facility]     [phone]    [address]  [place], [place] [address] Sees children Accepts Medicaid   

## 2019-07-24 NOTE — Progress Notes (Addendum)
Subjective:  Patient ID: [name], female    DOB: Oct 14, 1940, 79 y.o.   MRN: [id number]  Patient Care Team: Baruch Gouty, FNP as PCP - General (Family Medicine)   Chief Complaint:  Medical Management of Chronic Issues (3 mo ) and Hypertension   HPI: [name] is a 79 y.o. female presenting on 07/24/2019 for Medical Management of Chronic Issues (3 mo ) and Hypertension   1. COPD with chronic bronchitis ([place]) Well controlled with medications, she does endorse that at times she feels like she needs to use her inhaler more often than prescribed, however, she is only using rescue inhaler BID.  She does have some mild shortness of breath with exertional activity. Is able to cough up a moderate amount of sputum after using her inhalers but does not endorse an increase in cough otherwise. No fever, chills, weakness, or confusion.   2. Essential hypertension with goal blood pressure less than 130/80 Complaint with meds -yes   Current Medications - amlodipine 16m daily, hyzaar 100/269mdaily Checking BP at home ranging-does not check BP at home Exercising Regularly - yes, walks daily around town, does not drive  Watching Salt intake - yes, does not add salt to food Pertinent ROS:  Headache - no Fatigue - yes Visual Disturbances - no Chest pain - no Dyspnea - hx of copd, mild sob with exertional activity  Palpitations - no LE edema -no They report good compliance with medications and can restate their regimen by memory. No medication side effects.  Family, social, and smoking history reviewed.   BP Readings from Last 3 Encounters:  07/24/19 114/71  04/21/19 (!) 145/84  01/26/19 130/80   CMP Latest Ref Rng & Units 04/21/2019 01/19/2019 01/01/2019  Glucose 65 - 99 mg/dL 101(H) 86 97  BUN 8 - 27 mg/dL 12 22 23   Creatinine 0.57 - 1.00 mg/dL 1.08(H) 1.26(H) 1.28(H)  Sodium 134 - 144 mmol/L 142 143 144  Potassium 3.5 - 5.2 mmol/L 3.8 3.9 4.3  Chloride 96 - 106 mmol/L 102  102 103  CO2 20 - 29 mmol/L 26 27 26   Calcium 8.7 - 10.3 mg/dL 9.2 9.1 9.1  Total Protein 6.0 - 8.5 g/dL 7.0 7.0 6.5  Total Bilirubin 0.0 - 1.2 mg/dL 0.4 0.4 0.4  Alkaline Phos 39 - 117 IU/L 49 48 44  AST 0 - 40 IU/L 14 13 21   ALT 0 - 32 IU/L 6 6 6      3  Stage 3a chronic kidney disease Reports a mild decrease in urinary output, and a mild increase in fatigue but denies edema, confusion or weakness.   4. GAD (generalized anxiety disorder) 5. Depression, recurrent ([facility] stopped lexapro due to feeling very aggressive and angry on the medication. She endorses many family losses recently which has had am impact on her depression and anxiety. She endorses anxiety attacks a few times/week, uses deep breathing techniques during these episodes which does help. She also reports increased loss of interest in doing activities that she would normally be interested in. She states she has not drank alcohol in 16 years but has recently considered drinking again to help her deal with everything going on. Patient does endorse using prayer and exercise to help with her symptoms. She has not ever spoken with a counselor or psychiatrist but is open to the idea in order to help her manage her anxiety and depression.  [facility] 07/24/2019 04/21/2019 01/26/2019 01/19/2019 01/01/2019  Decreased Interest 1 0 0 0 0  Down, Depressed, Hopeless 1 1 0 0 0  PHQ - 2 Score 2 1 0 0 0  Altered sleeping 3 3 - - -  Tired, decreased energy 1 2 - - -  Change in appetite 2 0 - - -  Feeling bad or failure about yourself  0 1 - - -  Trouble concentrating 3 0 - - -  Moving slowly or fidgety/restless 2 1 - - -  Suicidal thoughts 0 0 - - -  PHQ-9 Score 13 8 - - -  Difficult doing work/chores Somewhat difficult Somewhat difficult - - -   GAD 7 : Generalized Anxiety Score 07/24/2019 04/21/2019  Nervous, Anxious, on Edge 3 1  Control/stop worrying 1 1  Worry too much - different things 2 1  Trouble relaxing 3 0   Restless 3 0  Easily annoyed or irritable 0 0  Afraid - awful might happen 3 1  Total GAD 7 Score 15 4  Anxiety Difficulty Very difficult -      Relevant past medical, surgical, family, and social history reviewed and updated as indicated.  Allergies and medications reviewed and updated. Date reviewed: Chart in Epic.   Past Medical History:  Diagnosis Date  . Arthritis    OA  . GERD (gastroesophageal reflux disease)   . Headache(784.0)   . Hepatitis    history of Hepatitis 20 years ago; not sure what kind  . History of gout   . Hypertension   . Hypertensive encephalopathy   . Stroke ([place])    SLIGHT RT SIDE WEAKNESS 2001    Past Surgical History:  Procedure Laterality Date  . ABDOMINAL HYSTERECTOMY    . ANTERIOR CERVICAL CORPECTOMY  03/07/2012   Procedure: ANTERIOR CERVICAL CORPECTOMY;  Surgeon: Kristeen Miss, MD;  Location: [facility];  Service: Neurosurgery;  Laterality: N/A;  Cervical six-seven, cervical seven-thoracic one Anterior cervical decompression/diskectomy/fusion, with Cervical seven Corpectomy, reconstruction using Allograft and Alphatec plate  . CATARACT EXTRACTION W/PHACO Left 04/12/2017   Procedure: CATARACT EXTRACTION PHACO AND INTRAOCULAR LENS PLACEMENT (IOC);  Surgeon: Baruch Goldmann, MD;  Location: [facility];  Service: Ophthalmology;  Laterality: Left;  CDE: 3.82  . CATARACT EXTRACTION W/PHACO Right 05/03/2017   Procedure: CATARACT EXTRACTION PHACO AND INTRAOCULAR LENS PLACEMENT RIGHT EYE;  Surgeon: Baruch Goldmann, MD;  Location: [facility];  Service: Ophthalmology;  Laterality: Right;  CDE: 3.89  . MULTIPLE TOOTH EXTRACTIONS    . TONSILLECTOMY      Social History   Socioeconomic History  . Marital status: Widowed    Spouse name: Not on file  . Number of children: 5  . Years of education: Not on file  . Highest education level: Not on file  Occupational History  . Occupation: retired    Comment: CNA  Tobacco Use  . Smoking status: Current Some Day  Smoker    Packs/day: 0.50    Years: 50.00    Pack years: 25.00    Types: Cigarettes  . Smokeless tobacco: Never Used  Substance and Sexual Activity  . Alcohol use: No  . Drug use: No  . Sexual activity: Not Currently    Birth control/protection: Surgical  Other Topics Concern  . Not on file  Social History Narrative  . Not on file   Social Determinants of Health   Financial Resource Strain:   . Difficulty of Paying Living Expenses: Not on file  Food Insecurity:   . Worried About [employer] of  Food in the Last Year: Not on file  . Ran Out of Food in the Last Year: Not on file  Transportation Needs:   . Lack of Transportation (Medical): Not on file  . Lack of Transportation (Non-Medical): Not on file  Physical Activity:   . Days of Exercise per Week: Not on file  . Minutes of Exercise per Session: Not on file  Stress:   . Feeling of Stress : Not on file  Social Connections:   . Frequency of Communication with Friends and Family: Not on file  . Frequency of Social Gatherings with Friends and Family: Not on file  . Attends Religious Services: Not on file  . Active Member of Clubs or Organizations: Not on file  . Attends Archivist Meetings: Not on file  . Marital Status: Not on file  Intimate Partner Violence:   . Fear of Current or Ex-Partner: Not on file  . Emotionally Abused: Not on file  . Physically Abused: Not on file  . Sexually Abused: Not on file    Outpatient Encounter Medications as of 07/24/2019  Medication Sig  . amLODipine (NORVASC) 10 MG tablet TAKE 1 TABLET DAILY  . clopidogrel (PLAVIX) 75 MG tablet Take 75 mg by mouth daily.  [name] losartan-hydrochlorothiazide (HYZAAR) 100-25 MG tablet Take 1 tablet by mouth daily.  . meloxicam (MOBIC) 15 MG tablet Take 1 tablet (15 mg total) by mouth at bedtime.  [name] PROAIR HFA 108 (90 Base) MCG/ACT inhaler Inhale 2 puffs into the lungs every 6 (six) hours as needed.  . SYMBICORT 160-4.5 MCG/ACT inhaler Inhale 2  puffs into the lungs 2 (two) times daily.  . sertraline (ZOLOFT) 50 MG tablet Take 0.5 tablets (25 mg total) by mouth daily.  . [DISCONTINUED] escitalopram (LEXAPRO) 10 MG tablet Take 1 tablet (10 mg total) by mouth daily.   No facility-administered encounter medications on file as of 07/24/2019.    Allergies  Allergen Reactions  . Penicillins Anaphylaxis, Swelling, Rash and Other (See Comments)    Has patient had a PCN reaction causing immediate rash, facial/tongue/throat swelling, SOB or lightheadedness with hypotension: yes Has patient had a PCN reaction causing severe rash involving mucus membranes or skin necrosis: no Has patient had a PCN reaction that required hospitalization: yes Has patient had a PCN reaction occurring within the last 10 years: no If all of the above answers are "NO", then may proceed with Cephalosporin use.     Review of Systems  Constitutional: Negative for activity change, fatigue and unexpected weight change.  HENT: Negative.   Eyes: Negative.   Respiratory: Positive for shortness of breath. Negative for chest tightness.   Cardiovascular: Negative for chest pain, palpitations and leg swelling.  Gastrointestinal: Negative for abdominal pain, constipation, diarrhea and nausea.  Endocrine: Negative for cold intolerance, heat intolerance, polydipsia and polyphagia.  Genitourinary: Positive for decreased urine volume. Negative for difficulty urinating and frequency.  Musculoskeletal: Negative for gait problem and myalgias.  Skin: Negative for color change.  Allergic/Immunologic: Negative.   Neurological: Negative for dizziness, weakness and numbness.       Parasthesias to Bilateral hands  Hematological: Negative.   Psychiatric/Behavioral: Positive for dysphoric mood. Negative for behavioral problems and sleep disturbance. The patient is nervous/anxious.   All other systems reviewed and are negative.       Objective:  BP 114/71   Pulse 81   Temp (!)  97.1 F (36.2 C)   Resp 20   Ht 5' 2"  (1.575 m)  Wt 131 lb (59.4 kg)   SpO2 97%   BMI 23.96 kg/m    Wt Readings from Last 3 Encounters:  07/24/19 131 lb (59.4 kg)  04/21/19 134 lb (60.8 kg)  01/26/19 135 lb (61.2 kg)    Physical Exam Vitals and nursing note reviewed.  Constitutional:      Appearance: Normal appearance. She is normal weight.  HENT:     Head: Normocephalic and atraumatic.     Nose: Nose normal.     Mouth/Throat:     Mouth: Mucous membranes are moist.  Eyes:     Pupils: Pupils are equal, round, and reactive to light.  Cardiovascular:     Rate and Rhythm: Normal rate and regular rhythm.     Pulses: Normal pulses.     Heart sounds: Normal heart sounds.  Pulmonary:     Effort: Pulmonary effort is normal.     Breath sounds: Normal breath sounds.  Abdominal:     General: Abdomen is flat.     Palpations: Abdomen is soft.  Musculoskeletal:        General: Normal range of motion.     Cervical back: Normal range of motion and neck supple.  Skin:    General: Skin is warm and dry.     Capillary Refill: Capillary refill takes less than 2 seconds.  Neurological:     General: No focal deficit present.     Mental Status: She is alert and oriented to person, place, and time. Mental status is at baseline.     Cranial Nerves: No cranial nerve deficit.     Sensory: No sensory deficit.     Motor: No weakness.     Coordination: Coordination normal.     Gait: Gait normal.     Deep Tendon Reflexes: Reflexes normal.  Psychiatric:        Mood and Affect: Mood is depressed.        Behavior: Behavior normal.        Thought Content: Thought content normal. Thought content does not include homicidal or suicidal ideation. Thought content does not include homicidal or suicidal plan.        Judgment: Judgment normal.     Results for orders placed or performed in visit on 04/21/19  CMP14+EGFR  Result Value Ref Range   Glucose 101 (H) 65 - 99 mg/dL   BUN 12 8 - 27 mg/dL    Creatinine, Ser 1.08 (H) 0.57 - 1.00 mg/dL   GFR calc non Af Amer 50 (L) >59 mL/min/1.73   GFR calc Af Amer 58 (L) >59 mL/min/1.73   BUN/Creatinine Ratio 11 (L) 12 - 28   Sodium 142 134 - 144 mmol/L   Potassium 3.8 3.5 - 5.2 mmol/L   Chloride 102 96 - 106 mmol/L   CO2 26 20 - 29 mmol/L   Calcium 9.2 8.7 - 10.3 mg/dL   Total Protein 7.0 6.0 - 8.5 g/dL   Albumin 4.3 3.7 - 4.7 g/dL   Globulin, Total 2.7 1.5 - 4.5 g/dL   Albumin/Globulin Ratio 1.6 1.2 - 2.2   Bilirubin Total 0.4 0.0 - 1.2 mg/dL   Alkaline Phosphatase 49 39 - 117 IU/L   AST 14 0 - 40 IU/L   ALT 6 0 - 32 IU/L  CBC with Differential/Platelet  Result Value Ref Range   WBC 5.5 3.4 - 10.8 x10E3/uL   RBC 5.17 3.77 - 5.28 x10E6/uL   Hemoglobin 15.5 11.1 - 15.9 g/dL   Hematocrit 49.0 (H)  34.0 - 46.6 %   MCV 95 79 - 97 fL   MCH 30.0 26.6 - 33.0 pg   MCHC 31.6 31.5 - 35.7 g/dL   RDW 12.2 11.7 - 15.4 %   Platelets 171 150 - 450 x10E3/uL   Neutrophils 55 Not Estab. %   Lymphs 35 Not Estab. %   Monocytes 7 Not Estab. %   Eos 2 Not Estab. %   Basos 1 Not Estab. %   Neutrophils Absolute 3.0 1.4 - 7.0 x10E3/uL   Lymphocytes Absolute 1.9 0.7 - 3.1 x10E3/uL   Monocytes Absolute 0.4 0.1 - 0.9 x10E3/uL   EOS (ABSOLUTE) 0.1 0.0 - 0.4 x10E3/uL   Basophils Absolute 0.0 0.0 - 0.2 x10E3/uL   Immature Granulocytes 0 Not Estab. %   Immature Grans (Abs) 0.0 0.0 - 0.1 x10E3/uL  Lipid panel  Result Value Ref Range   Cholesterol, Total 204 (H) 100 - 199 mg/dL   Triglycerides 80 0 - 149 mg/dL   HDL 63 >39 mg/dL   VLDL Cholesterol Cal 14 5 - 40 mg/dL   LDL Chol Calc (NIH) 127 (H) 0 - 99 mg/dL   Chol/HDL Ratio 3.2 0.0 - 4.4 ratio  Thyroid Panel With TSH  Result Value Ref Range   TSH 2.830 0.450 - 4.500 uIU/mL   T4, Total 6.9 4.5 - 12.0 ug/dL   T3 Uptake Ratio 27 24 - 39 %   Free Thyroxine Index 1.9 1.2 - 4.9  Microalbumin / creatinine urine ratio  Result Value Ref Range   Creatinine, Urine 116.7 Not Estab. mg/dL   Microalbumin,  Urine 30.5 Not Estab. ug/mL   Microalb/Creat Ratio 26 0 - 29 mg/g creat       Pertinent labs & imaging results that were available during my care of the patient were reviewed by me and considered in my medical decision making.  Assessment & Plan:  [name] was seen today for medical management of chronic issues and hypertension.  Diagnoses and all orders for this visit:  Depression, recurrent ([place]) GAD (generalized anxiety disorder) -     sertraline (ZOLOFT) 50 MG tablet; Take 0.5 tablets (25 mg total) by mouth daily. Will provide list of local counselors for patient. Unable to tolerate effexor so she stopped taking. Will trial Zoloft.   COPD with chronic bronchitis ([place]) Continue current medication regimen with good result.  Essential hypertension with goal blood pressure less than 130/80 -     Comprehensive metabolic panel -     CBC with Differential Continue current medicaton regimen, heart healthy diet and check BP 3x/week at home.   Stage 3a chronic kidney disease -     Comprehensive metabolic panel -     CBC with Differential Continue to drink plenty of water.  Paresthesia of both hands Will check for vitamin B12 deficiencies today.  -     Vitamin B12  Screening for osteoporosis -     DG [facility] DEXA; Future     Continue all other maintenance medications.  Follow up plan: Return if symptoms worsen or fail to improve.  Continue healthy lifestyle choices, including diet (rich in fruits, vegetables, and lean proteins, and low in salt and simple carbohydrates) and exercise (at least 30 minutes of moderate physical activity daily).  Educational handout given for mental health counselors.   The above assessment and management plan was discussed with the patient. The patient verbalized understanding of and has agreed to the management plan. Patient is aware to call the clinic if  they develop any new symptoms or if symptoms persist or worsen. Patient is aware when to return  to the clinic for a follow-up visit. Patient educated on when it is appropriate to go to the emergency department.    Scherrie Gerlach, BSN, RN, AGNP-Student  I personally was present during the history, physical exam, and medical decision-making activities of this service and have verified that the service and findings are accurately documented in the nurse practitioner student's note.  Monia Pouch, FNP-C [place] [address] [place], [place] [address] [phone]    Correction to DEXA scan results. Osteopenia, not osteoporosis on DEXA. Pt made aware.  Monia Pouch, FNP-C [facility] [address] [place], [place] [address] [phone]

## 2019-07-27 LAB — VITAMIN B12: Vitamin B-12: 280 pg/mL (ref 232–1245)

## 2019-07-27 LAB — CBC WITH DIFFERENTIAL/PLATELET
Basophils Absolute: 0 10*3/uL (ref 0.0–0.2)
Basos: 1 %
EOS (ABSOLUTE): 0.2 10*3/uL (ref 0.0–0.4)
Eos: 3 %
Hematocrit: 45.6 % (ref 34.0–46.6)
Hemoglobin: 15.3 g/dL (ref 11.1–15.9)
Immature Grans (Abs): 0 10*3/uL (ref 0.0–0.1)
Immature Granulocytes: 0 %
Lymphocytes Absolute: 2 10*3/uL (ref 0.7–3.1)
Lymphs: 33 %
MCH: 30.6 pg (ref 26.6–33.0)
MCHC: 33.6 g/dL (ref 31.5–35.7)
MCV: 91 fL (ref 79–97)
Monocytes Absolute: 0.3 10*3/uL (ref 0.1–0.9)
Monocytes: 5 %
Neutrophils Absolute: 3.4 10*3/uL (ref 1.4–7.0)
Neutrophils: 58 %
Platelets: 173 10*3/uL (ref 150–450)
RBC: 5 x10E6/uL (ref 3.77–5.28)
RDW: 12.8 % (ref 11.7–15.4)
WBC: 5.9 10*3/uL (ref 3.4–10.8)

## 2019-07-27 LAB — COMPREHENSIVE METABOLIC PANEL
ALT: 12 IU/L (ref 0–32)
AST: 24 IU/L (ref 0–40)
Albumin/Globulin Ratio: 1.7 (ref 1.2–2.2)
Albumin: 4.3 g/dL (ref 3.7–4.7)
Alkaline Phosphatase: 44 IU/L (ref 39–117)
BUN/Creatinine Ratio: 13 (ref 12–28)
BUN: 15 mg/dL (ref 8–27)
Bilirubin Total: 0.4 mg/dL (ref 0.0–1.2)
CO2: 22 mmol/L (ref 20–29)
Calcium: 9.3 mg/dL (ref 8.7–10.3)
Chloride: 104 mmol/L (ref 96–106)
Creatinine, Ser: 1.18 mg/dL — ABNORMAL HIGH (ref 0.57–1.00)
GFR calc Af Amer: 52 mL/min/{1.73_m2} — ABNORMAL LOW (ref >59)
GFR calc non Af Amer: 45 mL/min/{1.73_m2} — ABNORMAL LOW (ref >59)
Globulin, Total: 2.5 g/dL (ref 1.5–4.5)
Glucose: 86 mg/dL (ref 65–99)
Potassium: 4.7 mmol/L (ref 3.5–5.2)
Sodium: 146 mmol/L — ABNORMAL HIGH (ref 134–144)
Total Protein: 6.8 g/dL (ref 6.0–8.5)

## 2019-07-28 ENCOUNTER — Other Ambulatory Visit: Payer: Self-pay

## 2019-07-28 ENCOUNTER — Other Ambulatory Visit: Payer: Medicare Other

## 2019-07-28 DIAGNOSIS — M8589 Other specified disorders of bone density and structure, multiple sites: Secondary | ICD-10-CM | POA: Diagnosis not present

## 2019-08-21 ENCOUNTER — Ambulatory Visit: Payer: Medicare Other | Admitting: Family Medicine

## 2019-08-26 ENCOUNTER — Ambulatory Visit (INDEPENDENT_AMBULATORY_CARE_PROVIDER_SITE_OTHER): Payer: Medicare Other | Admitting: Family Medicine

## 2019-08-26 ENCOUNTER — Encounter: Payer: Self-pay | Admitting: Family Medicine

## 2019-08-26 ENCOUNTER — Other Ambulatory Visit: Payer: Self-pay

## 2019-08-26 VITALS — BP 136/87 | HR 81 | Temp 98.0°F | Ht 62.0 in | Wt 134.8 lb

## 2019-08-26 DIAGNOSIS — M8589 Other specified disorders of bone density and structure, multiple sites: Secondary | ICD-10-CM

## 2019-08-26 DIAGNOSIS — F339 Major depressive disorder, recurrent, unspecified: Secondary | ICD-10-CM

## 2019-08-26 DIAGNOSIS — F411 Generalized anxiety disorder: Secondary | ICD-10-CM

## 2019-08-26 MED ORDER — SERTRALINE HCL 50 MG PO TABS: 25.0000 mg | ORAL_TABLET | Freq: Every day | ORAL | 3 refills | Status: DC

## 2019-08-26 NOTE — Progress Notes (Addendum)
Subjective:  Patient ID: [name], female    DOB: 1940/07/19, 79 y.o.   MRN: [id number]  Patient Care Team: Sonny Masters, FNP as PCP - General (Family Medicine)   Chief Complaint:  Follow-up (depression)   HPI: [name] is a 79 y.o. female presenting on 08/26/2019 for Follow-up (depression)   1. Depression, recurrent (HCC) 2. GAD (generalized anxiety disorder) Pt reports she is doing very well with her current medication regimen. States she is feeling much better. No associated side effects from the medications.   GAD 7 : Generalized Anxiety Score 08/26/2019 07/24/2019 04/21/2019  Nervous, Anxious, on Edge 2 3 1   Control/stop worrying 0 1 1  Worry too much - different things 0 2 1  Trouble relaxing 0 3 0  Restless 0 3 0  Easily annoyed or irritable 0 0 0  Afraid - awful might happen 0 3 1  Total GAD 7 Score 2 15 4   Anxiety Difficulty - Very difficult -    Depression screen [facility] 2/9 08/26/2019 07/24/2019 04/21/2019 01/26/2019 01/19/2019  Decreased Interest 0 1 0 0 0  Down, Depressed, Hopeless 0 1 1 0 0  PHQ - 2 Score 0 2 1 0 0  Altered sleeping 2 3 3  - -  Tired, decreased energy 2 1 2  - -  Change in appetite 0 2 0 - -  Feeling bad or failure about yourself  0 0 1 - -  Trouble concentrating 0 3 0 - -  Moving slowly or fidgety/restless 2 2 1  - -  Suicidal thoughts 0 0 0 - -  PHQ-9 Score 6 13 8  - -  Difficult doing work/chores Not difficult at all Somewhat difficult Somewhat difficult - -    3. Osteopenia of multiple sites Patient has questions about what she can do to increase her calcium intake. No arthralgias or myalgias. No recent falls. Does walk daily.      Relevant past medical, surgical, family, and social history reviewed and updated as indicated.  Allergies and medications reviewed and updated. Date reviewed: Chart in Epic.   Past Medical History:  Diagnosis Date  . Arthritis    OA  . GERD (gastroesophageal reflux disease)   .  Headache(784.0)   . Hepatitis    history of Hepatitis 20 years ago; not sure what kind  . History of gout   . Hypertension   . Hypertensive encephalopathy   . Stroke (HCC)    SLIGHT RT SIDE WEAKNESS 2001    Past Surgical History:  Procedure Laterality Date  . ABDOMINAL HYSTERECTOMY    . ANTERIOR CERVICAL CORPECTOMY  03/07/2012   Procedure: ANTERIOR CERVICAL CORPECTOMY;  Surgeon: , MD;  Location: [facility];  Service: Neurosurgery;  Laterality: N/A;  Cervical six-seven, cervical seven-thoracic one Anterior cervical decompression/diskectomy/fusion, with Cervical seven Corpectomy, reconstruction using Allograft and Alphatec plate  . CATARACT EXTRACTION W/PHACO Left 04/12/2017   Procedure: CATARACT EXTRACTION PHACO AND INTRAOCULAR LENS PLACEMENT (IOC);  Surgeon: , MD;  Location: [facility];  Service: Ophthalmology;  Laterality: Left;  CDE: 3.82  . CATARACT EXTRACTION W/PHACO Right 05/03/2017   Procedure: CATARACT EXTRACTION PHACO AND INTRAOCULAR LENS PLACEMENT RIGHT EYE;  Surgeon: 2002, MD;  Location: [facility];  Service: Ophthalmology;  Laterality: Right;  CDE: 3.89  . MULTIPLE TOOTH EXTRACTIONS    . TONSILLECTOMY      Social History   Socioeconomic History  . Marital status: Widowed    Spouse name: Not  on file  . Number of children: 5  . Years of education: Not on file  . Highest education level: Not on file  Occupational History  . Occupation: retired    Comment: CNA  Tobacco Use  . Smoking status: Current Some Day Smoker    Packs/day: 0.50    Years: 50.00    Pack years: 25.00    Types: Cigarettes  . Smokeless tobacco: Never Used  Substance and Sexual Activity  . Alcohol use: No  . Drug use: No  . Sexual activity: Not Currently    Birth control/protection: Surgical  Other Topics Concern  . Not on file  Social History Narrative  . Not on file   Social Determinants of Health   Financial Resource Strain:   . Difficulty of Paying Living  Expenses:   Food Insecurity:   . Worried About Charity fundraiser in the Last Year:   . Arboriculturist in the Last Year:   Transportation Needs:   . Film/video editor (Medical):   [name] Lack of Transportation (Non-Medical):   Physical Activity:   . Days of Exercise per Week:   . Minutes of Exercise per Session:   Stress:   . Feeling of Stress :   Social Connections:   . Frequency of Communication with Friends and Family:   . Frequency of Social Gatherings with Friends and Family:   . Attends Religious Services:   . Active Member of Clubs or Organizations:   . Attends Archivist Meetings:   [name] Marital Status:   Intimate Partner Violence:   . Fear of Current or Ex-Partner:   . Emotionally Abused:   [name] Physically Abused:   . Sexually Abused:     Outpatient Encounter Medications as of 08/26/2019  Medication Sig  . amLODipine (NORVASC) 10 MG tablet TAKE 1 TABLET DAILY  . clopidogrel (PLAVIX) 75 MG tablet Take 75 mg by mouth daily.  [name] losartan-hydrochlorothiazide (HYZAAR) 100-25 MG tablet Take 1 tablet by mouth daily.  . meloxicam (MOBIC) 15 MG tablet Take 1 tablet (15 mg total) by mouth at bedtime.  [name] PROAIR HFA 108 (90 Base) MCG/ACT inhaler Inhale 2 puffs into the lungs every 6 (six) hours as needed.  . sertraline (ZOLOFT) 50 MG tablet Take 0.5 tablets (25 mg total) by mouth daily.  . [DISCONTINUED] SYMBICORT 160-4.5 MCG/ACT inhaler Inhale 2 puffs into the lungs 2 (two) times daily.   No facility-administered encounter medications on file as of 08/26/2019.    Allergies  Allergen Reactions  . Penicillins Anaphylaxis, Swelling, Rash and Other (See Comments)    Has patient had a PCN reaction causing immediate rash, facial/tongue/throat swelling, SOB or lightheadedness with hypotension: yes Has patient had a PCN reaction causing severe rash involving mucus membranes or skin necrosis: no Has patient had a PCN reaction that required hospitalization: yes Has patient had a  PCN reaction occurring within the last 10 years: no If all of the above answers are "NO", then may proceed with Cephalosporin use.     Review of Systems  Constitutional: Positive for fatigue. Negative for activity change, appetite change, chills, diaphoresis, fever and unexpected weight change.  HENT: Negative.   Eyes: Negative.  Negative for photophobia and visual disturbance.  Respiratory: Negative for cough, chest tightness and shortness of breath.   Cardiovascular: Negative for chest pain, palpitations and leg swelling.  Gastrointestinal: Negative for abdominal pain, blood in stool, constipation, diarrhea, nausea and vomiting.  Endocrine: Negative.   Genitourinary:  Negative for decreased urine volume, difficulty urinating, dysuria, frequency and urgency.  Musculoskeletal: Negative for arthralgias, back pain, gait problem and myalgias.  Skin: Negative.   Allergic/Immunologic: Negative.   Neurological: Negative for dizziness, tremors, seizures, syncope, facial asymmetry, speech difficulty, weakness, light-headedness, numbness and headaches.  Hematological: Negative.   Psychiatric/Behavioral: Positive for dysphoric mood and sleep disturbance. Negative for agitation, behavioral problems, confusion, decreased concentration, hallucinations, self-injury and suicidal ideas. The patient is nervous/anxious. The patient is not hyperactive.   All other systems reviewed and are negative.       Objective:  BP 136/87   Pulse 81   Temp 98 F (36.7 C)   Ht 5\' 2"  (1.575 m)   Wt 134 lb 12.8 oz (61.1 kg)   SpO2 97%   BMI 24.66 kg/m    Wt Readings from Last 3 Encounters:  08/26/19 134 lb 12.8 oz (61.1 kg)  07/24/19 131 lb (59.4 kg)  04/21/19 134 lb (60.8 kg)    Physical Exam Vitals and nursing note reviewed.  Constitutional:      General: She is not in acute distress.    Appearance: Normal appearance. She is well-developed and well-groomed. She is not ill-appearing, toxic-appearing or  diaphoretic.  HENT:     Head: Normocephalic and atraumatic.     Jaw: There is normal jaw occlusion.     Right Ear: Hearing normal.     Left Ear: Hearing normal.     Nose: Nose normal.     Mouth/Throat:     Lips: Pink.     Mouth: Mucous membranes are moist.     Pharynx: Oropharynx is clear. Uvula midline.  Eyes:     General: Lids are normal.     Extraocular Movements: Extraocular movements intact.     Conjunctiva/sclera: Conjunctivae normal.     Pupils: Pupils are equal, round, and reactive to light.  Neck:     Thyroid: No thyroid mass, thyromegaly or thyroid tenderness.     Vascular: No carotid bruit or JVD.     Trachea: Trachea and phonation normal.  Cardiovascular:     Rate and Rhythm: Normal rate and regular rhythm.     Chest Wall: PMI is not displaced.     Pulses: Normal pulses.     Heart sounds: Normal heart sounds. No murmur. No friction rub. No gallop.   Pulmonary:     Effort: Pulmonary effort is normal. No respiratory distress.     Breath sounds: Normal breath sounds. No wheezing.  Abdominal:     General: Bowel sounds are normal. There is no distension or abdominal bruit.     Palpations: Abdomen is soft. There is no hepatomegaly or splenomegaly.     Tenderness: There is no abdominal tenderness. There is no right CVA tenderness or left CVA tenderness.     Hernia: No hernia is present.  Musculoskeletal:        General: Normal range of motion.     Cervical back: Normal range of motion and neck supple.     Right lower leg: No edema.     Left lower leg: No edema.  Lymphadenopathy:     Cervical: No cervical adenopathy.  Skin:    General: Skin is warm and dry.     Capillary Refill: Capillary refill takes less than 2 seconds.     Coloration: Skin is not cyanotic, jaundiced or pale.     Findings: No rash.  Neurological:     General: No focal deficit present.  Mental Status: She is alert and oriented to person, place, and time.     Cranial Nerves: Cranial nerves are  intact.     Sensory: Sensation is intact.     Motor: Motor function is intact.     Coordination: Coordination is intact.     Gait: Gait is intact.     Deep Tendon Reflexes: Reflexes are normal and symmetric.  Psychiatric:        Attention and Perception: Attention and perception normal.        Mood and Affect: Mood and affect normal.        Speech: Speech normal.        Behavior: Behavior normal. Behavior is cooperative.        Thought Content: Thought content normal.        Cognition and Memory: Cognition and memory normal.        Judgment: Judgment normal.     Results for orders placed or performed in visit on 07/24/19  Comprehensive metabolic panel  Result Value Ref Range   Glucose 86 65 - 99 mg/dL   BUN 15 8 - 27 mg/dL   Creatinine, Ser 2.44 (H) 0.57 - 1.00 mg/dL   GFR calc non Af Amer 45 (L) >59 mL/min/1.73   GFR calc Af Amer 52 (L) >59 mL/min/1.73   BUN/Creatinine Ratio 13 12 - 28   Sodium 146 (H) 134 - 144 mmol/L   Potassium 4.7 3.5 - 5.2 mmol/L   Chloride 104 96 - 106 mmol/L   CO2 22 20 - 29 mmol/L   Calcium 9.3 8.7 - 10.3 mg/dL   Total Protein 6.8 6.0 - 8.5 g/dL   Albumin 4.3 3.7 - 4.7 g/dL   Globulin, Total 2.5 1.5 - 4.5 g/dL   Albumin/Globulin Ratio 1.7 1.2 - 2.2   Bilirubin Total 0.4 0.0 - 1.2 mg/dL   Alkaline Phosphatase 44 39 - 117 IU/L   AST 24 0 - 40 IU/L   ALT 12 0 - 32 IU/L  CBC with Differential  Result Value Ref Range   WBC 5.9 3.4 - 10.8 x10E3/uL   RBC 5.00 3.77 - 5.28 x10E6/uL   Hemoglobin 15.3 11.1 - 15.9 g/dL   Hematocrit 01.0 27.2 - 46.6 %   MCV 91 79 - 97 fL   MCH 30.6 26.6 - 33.0 pg   MCHC 33.6 31.5 - 35.7 g/dL   RDW 53.6 64.4 - 03.4 %   Platelets 173 150 - 450 x10E3/uL   Neutrophils 58 Not Estab. %   Lymphs 33 Not Estab. %   Monocytes 5 Not Estab. %   Eos 3 Not Estab. %   Basos 1 Not Estab. %   Neutrophils Absolute 3.4 1.4 - 7.0 x10E3/uL   Lymphocytes Absolute 2.0 0.7 - 3.1 x10E3/uL   Monocytes Absolute 0.3 0.1 - 0.9 x10E3/uL   EOS  (ABSOLUTE) 0.2 0.0 - 0.4 x10E3/uL   Basophils Absolute 0.0 0.0 - 0.2 x10E3/uL   Immature Granulocytes 0 Not Estab. %   Immature Grans (Abs) 0.0 0.0 - 0.1 x10E3/uL  Vitamin B12  Result Value Ref Range   Vitamin B-12 280 232 - 1,245 pg/mL       Pertinent labs & imaging results that were available during my care of the patient were reviewed by me and considered in my medical decision making.  Assessment & Plan:  Shifa was seen today for follow-up.  Diagnoses and all orders for this visit:  Depression, recurrent [facility]) Patient states that she is  feeling much better from her recent depression episode. Will continue current medication regimen.   GAD (generalized anxiety disorder) Patients states she is feeling much better with her anxiety and agitation. Will continue current medication regimen.   Osteopenia of multiple sites Osteopenia information provided to pt with recommended calcium and vit d repletion.     Continue all other maintenance medications.  Follow up plan: Return in about 3 months (around 11/25/2019), or if symptoms worsen or fail to improve.   Medical decision-making:  15 minutes spent today reviewing the medical chart, counseling the patient/family, and documenting today's visit.  Continue healthy lifestyle choices, including diet (rich in fruits, vegetables, and lean proteins, and low in salt and simple carbohydrates) and exercise (at least 30 minutes of moderate physical activity daily).  Educational handout given for osteopenia.  The above assessment and management plan was discussed with the patient. The patient verbalized understanding of and has agreed to the management plan. Patient is aware to call the clinic if they develop any new symptoms or if symptoms persist or worsen. Patient is aware when to return to the clinic for a follow-up visit. Patient educated on when it is appropriate to go to the emergency department.   Richrd PrimeAlethia Tayvian Holycross, RN, student  FNP  I personally was present during the history, physical exam, and medical decision-making activities of this service and have verified that the service and findings are accurately documented in the nurse practitioner student's note.  Kari BaarsMichelle Rakes, FNP-C [facility] [address] [place], [place] [address] [phone]'

## 2019-08-26 NOTE — Patient Instructions (Addendum)
Your recent bone density scan demonstrated that you have osteopenia.  Osteopenia is when your bones become thinner and weaker.  This is not osteoporosis but can become osteoporosis over time.  Today, I provided you a handout reviewing calcium and vitamin D and ways to incorporate this in your diet.  You may need to take a supplement if you are unable to get sufficient amount of these minerals through food.  Strength training is just as important in maintaining bone health.  A copy of home exercises has been provided to you. You need to repeat your bone density in 2 years.   Exercise for Strong Bones  Exercise is important to build and maintain strong bones / bone density.  There are 2 types of exercises that are important to building and maintaining strong bones:  Weight- bearing and muscle-stregthening.  Weight-bearing Exercises  These exercises include activities that make you move against gravity while staying upright. Weight-bearing exercises can be high-impact or low-impact.  High-impact weight-bearing exercises help build bones and keep them strong. If you have broken a bone due to osteoporosis or are at risk of breaking a bone, you may need to avoid high-impact exercises. If you're not sure, you should check with your healthcare provider.  Examples of high-impact weight-bearing exercises are: Dancing  Doing high-impact aerobics  Hiking  Jogging/running  Jumping Rope  Stair climbing  Tennis  Low-impact weight-bearing exercises can also help keep bones strong and are a safe alternative if you cannot do high-impact exercises.   Examples of low-impact weight-bearing exercises are: Using elliptical training machines  Doing low-impact aerobics  Using stair-step machines  Fast walking on a treadmill or outside   Muscle-Strengthening Exercises These exercises include activities where you move your body, a weight or some other resistance against gravity. They are also known as resistance  exercises and include: Lifting weights  Using elastic exercise bands  Using weight machines  Lifting your own body weight  Functional movements, such as standing and rising up on your toes  Yoga and Pilates can also improve strength, balance and flexibility. However, certain positions may not be safe for people with osteoporosis or those at increased risk of broken bones. For example, exercises that have you bend forward may increase the chance of breaking a bone in the spine.   Non-Impact Exercises There are other types of exercises that can help prevent falls.  Non-impact exercises can help you to improve balance, posture and how well you move in everyday activities. Some of these exercises include: Balance exercises that strengthen your legs and test your balance, such as Tai Chi, can decrease your risk of falls.  Posture exercises that improve your posture and reduce rounded or "sloping" shoulders can help you decrease the chance of breaking a bone, especially in the spine.  Functional exercises that improve how well you move can help you with everyday activities and decrease your chance of falling and breaking a bone. For example, if you have trouble getting up from a chair or climbing stairs, you should do these activities as exercises.   A physical therapist can teach you balance, posture and functional exercises. He/she can also help you learn which exercises are safe and appropriate for you.  [facility] has a physical therapy office in [place] in front of our office and referrals can be made for assessments and treatment as needed and strength and balance training.  If you would like to have an assessment with [facility] and our physical therapy   team please let a nurse or provider know.  For more information go to the National Osteoporosis Foundation website: https://www.nof.org/.   Click the striped box in the right upper corner.   Then click through patient info in the left lower hand  corner to out more about osteoporosis and osteopenia and how you can prevent fractures.   Follow these instructions at home: Include calcium and vitamin D in your diet. Calcium is important for bone health, and vitamin D helps the body absorb calcium. Os-Cal is a great over the counter supplement to take daily. It has calcium and Vitamin D.  Perform weight-bearing and muscle-strengthening exercises as directed by your health care provider.  Do not use any tobacco products, including cigarettes, chewing tobacco, and electronic cigarettes. If you need help quitting, ask your health care provider. Limit your alcohol intake. Take medicines only as directed by your health care provider. Keep all follow-up visits as directed by your health care provider. This is important. Take precautions at home to lower your risk of falling, such as: Keeping rooms well lit and clutter free. Installing safety rails on stairs. Using rubber mats in the bathroom and other areas that are often wet or slippery.     Calcium & Vitamin D: The Facts  Why is calcium and vitamin D consumption important? Calcium: Most Americans do not consume adequate amounts of calcium! Calcium is required for proper muscle function, nerve communication, bone support, and many other functions in the body.  The body uses bones as a source of calcium. Bones 'remodel' themselves continuously - the body constantly breaks bone down to release calcium and rebuilds bones by replacing calcium in the bone later.  As we get older, the rate of bone breakdown occurs faster than bone rebuilding which could lead to osteopenia, osteoporosis, and possible fractures.   Vitamin D: People naturally make vitamin D in the body when sunlight hits the skin and triggers a process that leads to vitamin D production. This natural vitamin D production requires about 10-15 minutes of sun exposure on the hands, arms, and face at least 2-3 times per week. However,  due to decreased sun exposure and the use of sunscreen, most people will need to get additional vitamin D from foods or supplements. Your doctor can measure your body's vitamin D level through a simple blood test to determine your daily vitamin D needs.  Vitamin D is used to help the body absorb calcium, maintain bone health, help the immune system, and reduce inflammation. It also plays a role in muscle performance, balance and risk of falling.  Vitamin D deficiency can lead to osteomalacia or softening of the bones, bone pain, and muscle weakness.   The recommended daily allowance of Calcium and Vitamin D varies for different age groups. Age group Calcium (mg) Vitamin D (IU)  Females and Males: Age 19-50 1000 mg 600 IU  Females: Age 51- 70 1200 mg 600 IU  Males: Age 51-70 1000 mg 600 IU  Females and Males: Age 71+ 1200 mg 800 IU  Pregnant/lactating Females age 19-50 1000 mg 600 IU   How much Calcium do you get in your diet? Calcium Intake # of servings per day  Total calcium (mg)  Skim milk, 2% milk (1 cup) _________ x 300 mg   Yogurt (1 small container) _________ x 200 mg   Cheese (1oz) _________ x 200 mg   Cottage Cheese (1 cup)               ________ x 150 mg   Almond milk (1 cup) _________ x 450 mg   Fortified Orange Juice (1 cup) _________ x 300 mg   Broccoli or spinach ( 1 cup) _________ x 100 mg   Salmon (3 oz) _________ x 150 mg    Almonds (1/4 cup) _______ x 90 mg      How do we get Calcium and Vitamin D in our diet? Calcium: Obtaining calcium from the diet is the most preferred way to reach the recommended daily goal. If this goal is not reached through diet, calcium supplements are available.  Calcium is found in many foods including: dairy products, dark leafy vegetables (like broccoli, kale, and spinach), fish, and fortified products like juices and cereals.  The food label will have a %DV (percent daily value) listed showing the amount of calcium per serving. To determine  the total mg per serving, simply replace the % with zero (0).  For example, Almond Breeze almond milk contains 45% DV of calcium or 450mg per 1 cup.  You can increase the amount of calcium in your diet by using more calcium products in your daily meals. Use yogurt and fruit to make smoothies or use yogurt to top baked potatoes or make whipped potatoes. Sprinkle low fat cheese onto salads or into egg white omelets. You can even add non-fat dry milk powder (300mg calcium per 1/3 cup) to hot cereals, meat loaf, soups, or potatoes.  Calcium supplements come in many forms including tablets, chewables, and gummies. Be sure to read the label to determine the correct number of tablets per serving and whether or not to take the supplement with food.  Calcium carbonate products (Oscal, Caltrate, and Viactiv) are generally better absorbed when taken with food while calcium citrate products like Citracal can be taken with or without food.  The body can only absorb about 600 mg of calcium at one time. It is recommended to take calcium supplements in small amounts several times per day.  However, taking it all at once is better than not taking it at all. Increasing your intake of calcium is essential for bone health, but may also lead to some side effects like constipation, increased gas, bloating or abdominal cramping. To help reduce these side effects, start with 1 tablet per day and slowly increase your intake of the supplement to the recommended doses. It is also recommended that you drink plenty of water each day. Vitamin D: Very few foods naturally contain vitamin D. However, it is found in saltwater fish (like tuna, salmon and mackerel), beef liver, egg yolks, cheese and vitamin D fortified foods (like yogurt, cereals, orange juice and milk) The amount of vitamin D in each food or product is listed as %DV on the product label. To determine the total amount of vitamin D per serving, drop the % sign and multiply the  number by 4. For example, 1 cup of Almond Breeze almond milk contains 25% DV vitamin D or 100 IU per serving (25 x 4 =100). Vitamin D is also found in multivitamins and supplements and may be listed as ergocalciferol (vitamin D2) or cholecalciferol (vitamin D3). Each of these forms of vitamin D are equivalent and the daily recommended intake will vary based on your age and the vitamin D levels in your body. Follow your doctor's recommendation for vitamin D intake.      recommended intake will vary based on your age and the vitamin D levels in your body. Follow your doctor's recommendation for vitamin D intake.

## 2019-09-25 ENCOUNTER — Other Ambulatory Visit: Payer: Self-pay | Admitting: *Deleted

## 2019-09-25 MED ORDER — CLOPIDOGREL BISULFATE 75 MG PO TABS: 75.0000 mg | ORAL_TABLET | Freq: Every day | ORAL | 1 refills | Status: DC

## 2019-10-30 ENCOUNTER — Other Ambulatory Visit: Payer: Self-pay | Admitting: *Deleted

## 2019-10-30 DIAGNOSIS — I10 Essential (primary) hypertension: Secondary | ICD-10-CM

## 2019-10-30 MED ORDER — AMLODIPINE BESYLATE 10 MG PO TABS: 10.0000 mg | ORAL_TABLET | Freq: Every day | ORAL | 0 refills | Status: DC

## 2019-11-27 ENCOUNTER — Ambulatory Visit: Payer: Medicare Other | Admitting: Family Medicine

## 2019-12-02 ENCOUNTER — Other Ambulatory Visit: Payer: Self-pay

## 2019-12-02 ENCOUNTER — Encounter: Payer: Self-pay | Admitting: Physician Assistant

## 2019-12-02 ENCOUNTER — Ambulatory Visit (INDEPENDENT_AMBULATORY_CARE_PROVIDER_SITE_OTHER): Payer: Medicare Other | Admitting: Physician Assistant

## 2019-12-02 VITALS — BP 129/74 | HR 81 | Temp 97.4°F | Resp 20 | Ht 62.0 in | Wt 125.0 lb

## 2019-12-02 DIAGNOSIS — Z8673 Personal history of transient ischemic attack (TIA), and cerebral infarction without residual deficits: Secondary | ICD-10-CM

## 2019-12-02 DIAGNOSIS — I10 Essential (primary) hypertension: Secondary | ICD-10-CM

## 2019-12-02 DIAGNOSIS — J449 Chronic obstructive pulmonary disease, unspecified: Secondary | ICD-10-CM

## 2019-12-02 DIAGNOSIS — F339 Major depressive disorder, recurrent, unspecified: Secondary | ICD-10-CM | POA: Diagnosis not present

## 2019-12-02 NOTE — Patient Instructions (Signed)

## 2019-12-02 NOTE — Progress Notes (Signed)
°  Subjective:     Patient ID: [name], female   DOB: 02/18/1941, 79 y.o.   MRN: [id number]  HPI Pt here for f/u of chronic conditions Hx of HTN, TIA due to HTN, COPD, and depression States she has been doing well Denies any CP,SOB, or lower ext edema She has currently weaned herself off of the Zoloft Pt stays as active as possible She did have several episodes of some dizziness with change in position but that has resolved Thinks may of been due to ear wax Still currently smoking ~ 1/2 ppd  Review of Systems  Constitutional: Negative.   Respiratory: Negative.   Cardiovascular: Negative.   Gastrointestinal: Negative.   Musculoskeletal: Negative.   Neurological: Positive for dizziness and light-headedness. Negative for tremors, syncope, facial asymmetry, weakness and headaches.       Objective:   Physical Exam Vitals and nursing note reviewed.  Constitutional:      General: She is not in acute distress.    Appearance: Normal appearance. She is not ill-appearing or toxic-appearing.  HENT:     Right Ear: Tympanic membrane, ear canal and external ear normal.     Left Ear: Tympanic membrane, ear canal and external ear normal.  Neck:     Vascular: No carotid bruit.  Cardiovascular:     Rate and Rhythm: Normal rate and regular rhythm.     Pulses: Normal pulses.     Heart sounds: Normal heart sounds.  Pulmonary:     Effort: Pulmonary effort is normal.     Breath sounds: Normal breath sounds.  Musculoskeletal:     Cervical back: Neck supple.     Right lower leg: No edema.     Left lower leg: No edema.  Skin:    General: Skin is warm.  Neurological:     Mental Status: She is alert.     Gait: Gait normal.  Psychiatric:        Mood and Affect: Mood normal.        Behavior: Behavior normal.        Thought Content: Thought content normal.        Judgment: Judgment normal.        Assessment:     1. Depression, recurrent (HCC)   2. COPD with chronic bronchitis  (HCC)   3. Essential hypertension with goal blood pressure less than 130/80   4. History of TIA (transient ischemic attack)        Plan:     Pt with recent labs so not repeated today States she has enough refill of current meds Discussed continuing to wean her cigarette use If she has anymore vertigo episodes would like for her to use her at home BP monitor to get BP and pulse readings Given hx of COPD would like for her to stay indoors during the very hot humid days F/U in 4 months with labs- sooner if any problems

## 2019-12-17 ENCOUNTER — Other Ambulatory Visit: Payer: Self-pay | Admitting: Nurse Practitioner

## 2020-01-14 IMAGING — DX DG THORACIC SPINE 2V
3 series · 3 of 3 positions shown · non-contrast
Comparison: None.

CLINICAL DATA: Back pain

EXAM:
THORACIC SPINE 2 VIEWS; LUMBAR SPINE - 2-3 VIEW

[t-spine ap]
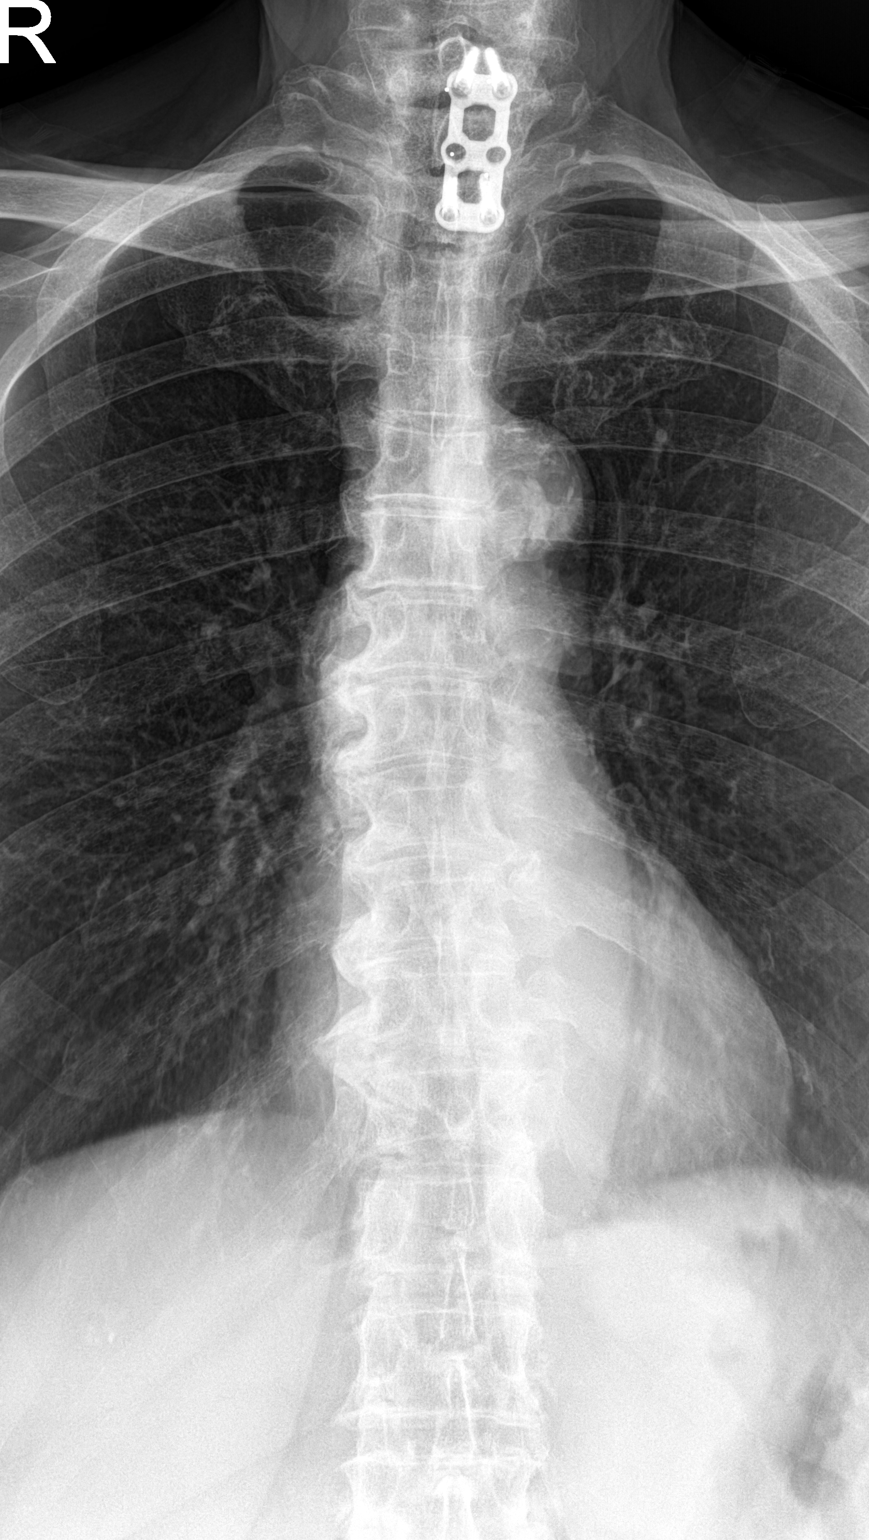

[t-spine lat]
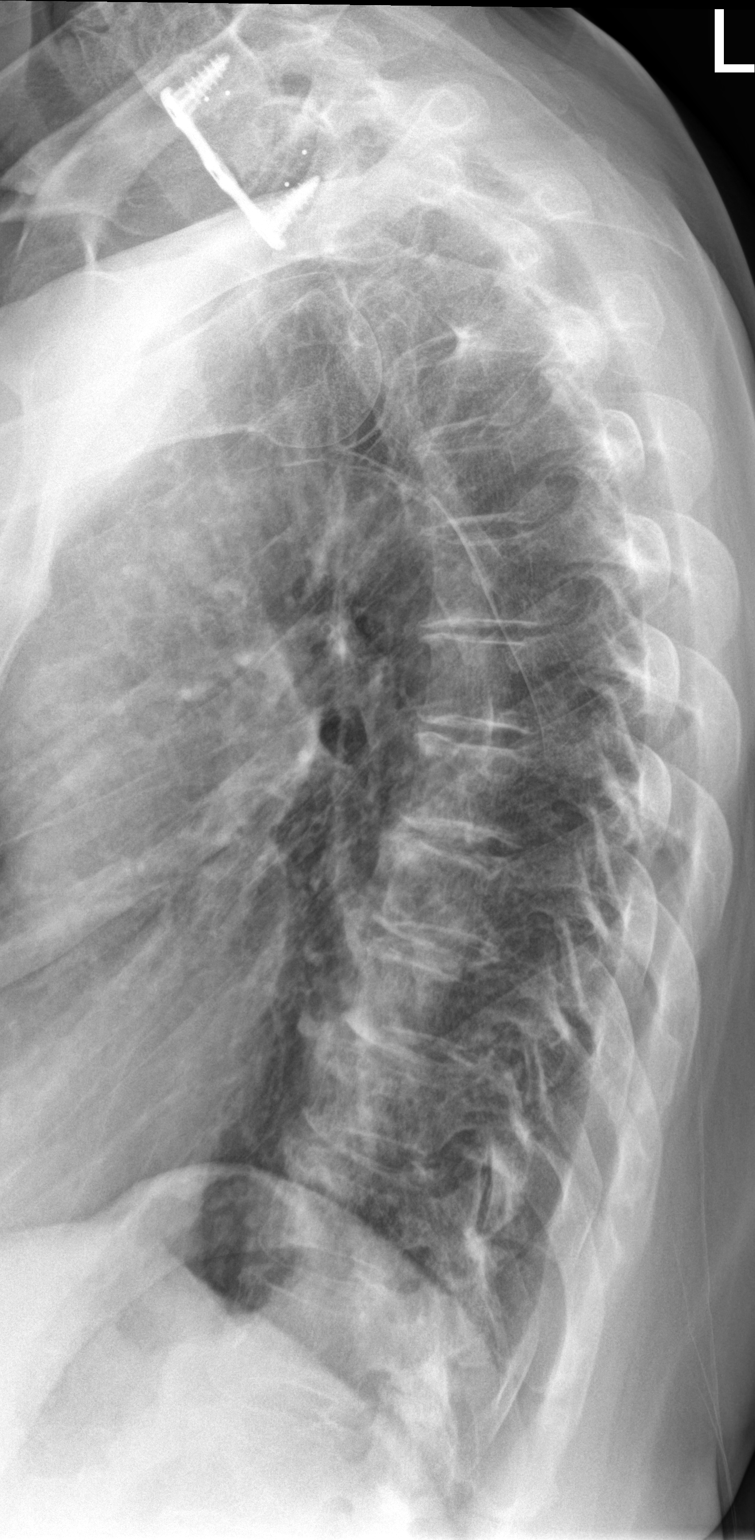

[t-spine lat swimmers]
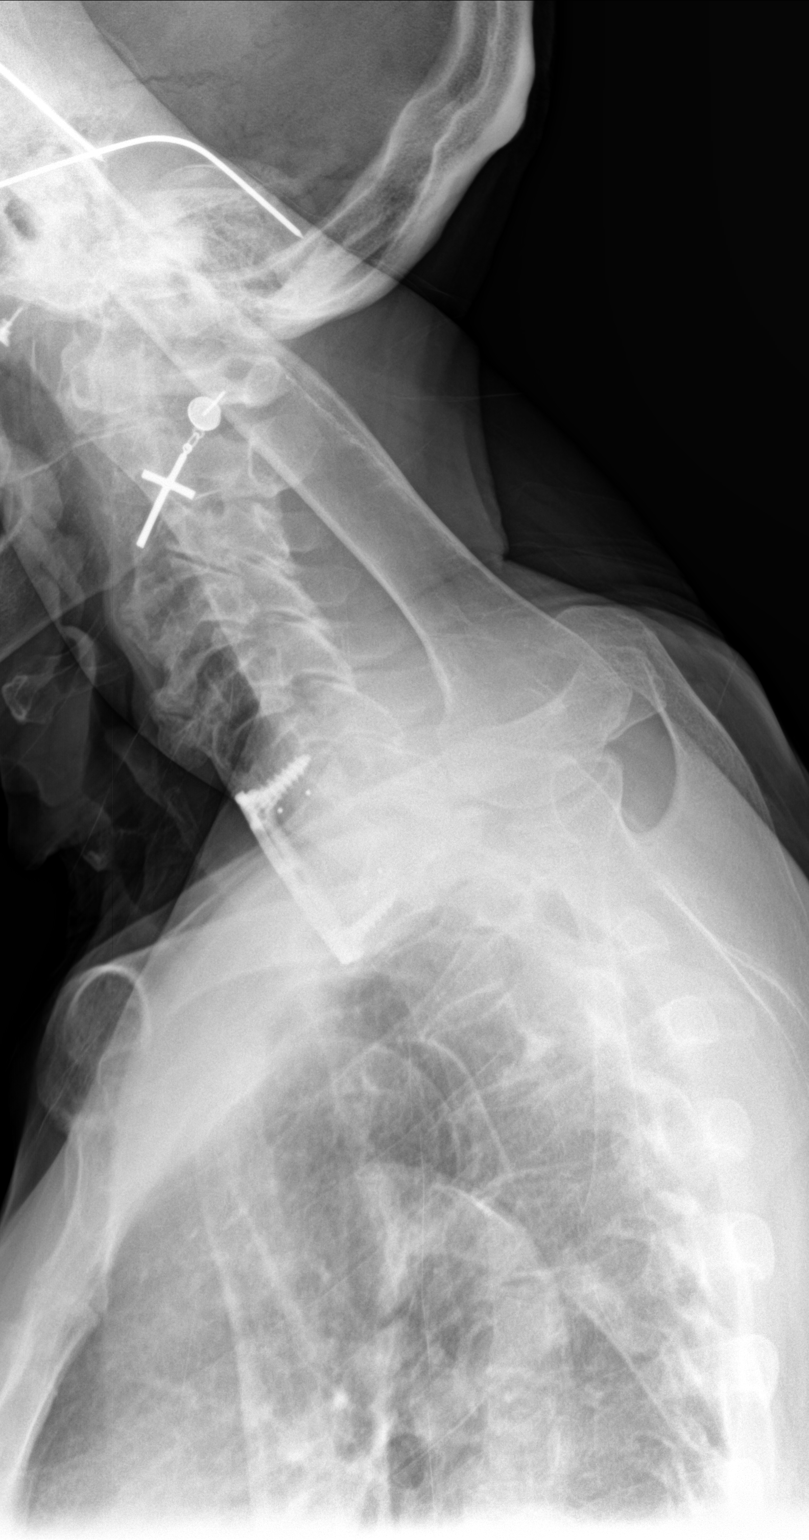

[3 of 3 positions shown; findings below may reference images not displayed]

FINDINGS: Thoracic spine: There is no evidence of thoracic spine fracture.
Slightly exaggerated upper thoracic spine kyphosis. Mild disc height
loss with osteophyte formation seen within the mid to lower thoracic
spine. No other significant bone abnormalities are identified.
Aortic knob calcifications. Overlying cervical spine fixation
hardware seen.

Lumbar spine: No fracture or malalignment. Mild disc height loss and
facet arthrosis most notable in the lower lumbar spine from L3
through S1. Scattered atherosclerotic calcifications seen.
IMPRESSION: Mid to lower thoracic spine mild spondylosis with slightly
exaggerated kyphosis.

Lower lumbar spine spondylosis.

## 2020-01-20 ENCOUNTER — Telehealth: Payer: Self-pay

## 2020-01-20 ENCOUNTER — Telehealth: Payer: Self-pay | Admitting: Family Medicine

## 2020-01-20 DIAGNOSIS — J449 Chronic obstructive pulmonary disease, unspecified: Secondary | ICD-10-CM

## 2020-01-20 MED ORDER — PROAIR HFA 108 (90 BASE) MCG/ACT IN AERS: 2.0000 | INHALATION_SPRAY | Freq: Four times a day (QID) | RESPIRATORY_TRACT | 2 refills | Status: DC | PRN

## 2020-01-20 NOTE — Telephone Encounter (Signed)
Error

## 2020-01-20 NOTE — Telephone Encounter (Signed)
  Prescription Request  01/20/2020  What is the name of the medication or equipment? Inhalers  Have you contacted your pharmacy to request a refill? (if applicable) Yes  Which pharmacy would you like this sent to? [facility]  Pt needs refills on her inhaler Rx's. Says she is almost out and was told by pharmacist that she had no refills. Pt had her last check up on 12/02/19.   Patient notified that their request is being sent to the clinical staff for review and that they should receive a response within 2 business days.

## 2020-01-20 NOTE — Telephone Encounter (Signed)
Patient seen Annette Stable and has follow up with Alona Bene in October. Rx refilled until appointment - patient aware

## 2020-01-22 ENCOUNTER — Emergency Department (HOSPITAL_COMMUNITY): Payer: Medicare Other

## 2020-01-22 ENCOUNTER — Inpatient Hospital Stay (HOSPITAL_COMMUNITY)
Admission: EM | Admit: 2020-01-22 | Discharge: 2020-01-24 | DRG: 177 | Disposition: A | Discharge status: Home / Self Care | Payer: Medicare Other | Attending: Family Medicine | Admitting: Family Medicine

## 2020-01-22 ENCOUNTER — Encounter (HOSPITAL_COMMUNITY): Payer: Self-pay

## 2020-01-22 ENCOUNTER — Other Ambulatory Visit: Payer: Self-pay

## 2020-01-22 DIAGNOSIS — I69351 Hemiplegia and hemiparesis following cerebral infarction affecting right dominant side: Secondary | ICD-10-CM | POA: Diagnosis not present

## 2020-01-22 DIAGNOSIS — J690 Pneumonitis due to inhalation of food and vomit: Secondary | ICD-10-CM | POA: Diagnosis not present

## 2020-01-22 DIAGNOSIS — M1990 Unspecified osteoarthritis, unspecified site: Secondary | ICD-10-CM | POA: Diagnosis present

## 2020-01-22 DIAGNOSIS — I129 Hypertensive chronic kidney disease with stage 1 through stage 4 chronic kidney disease, or unspecified chronic kidney disease: Secondary | ICD-10-CM | POA: Diagnosis present

## 2020-01-22 DIAGNOSIS — Z9842 Cataract extraction status, left eye: Secondary | ICD-10-CM

## 2020-01-22 DIAGNOSIS — I739 Peripheral vascular disease, unspecified: Secondary | ICD-10-CM | POA: Diagnosis not present

## 2020-01-22 DIAGNOSIS — Z8249 Family history of ischemic heart disease and other diseases of the circulatory system: Secondary | ICD-10-CM

## 2020-01-22 DIAGNOSIS — J9601 Acute respiratory failure with hypoxia: Secondary | ICD-10-CM | POA: Diagnosis present

## 2020-01-22 DIAGNOSIS — I700 Atherosclerosis of aorta: Secondary | ICD-10-CM | POA: Diagnosis present

## 2020-01-22 DIAGNOSIS — I10 Essential (primary) hypertension: Secondary | ICD-10-CM | POA: Diagnosis not present

## 2020-01-22 DIAGNOSIS — Z20822 Contact with and (suspected) exposure to covid-19: Secondary | ICD-10-CM | POA: Diagnosis present

## 2020-01-22 DIAGNOSIS — Z79899 Other long term (current) drug therapy: Secondary | ICD-10-CM | POA: Diagnosis not present

## 2020-01-22 DIAGNOSIS — Z825 Family history of asthma and other chronic lower respiratory diseases: Secondary | ICD-10-CM | POA: Diagnosis not present

## 2020-01-22 DIAGNOSIS — R7989 Other specified abnormal findings of blood chemistry: Secondary | ICD-10-CM | POA: Diagnosis not present

## 2020-01-22 DIAGNOSIS — Z981 Arthrodesis status: Secondary | ICD-10-CM | POA: Diagnosis not present

## 2020-01-22 DIAGNOSIS — Z720 Tobacco use: Secondary | ICD-10-CM | POA: Diagnosis present

## 2020-01-22 DIAGNOSIS — R52 Pain, unspecified: Secondary | ICD-10-CM | POA: Diagnosis not present

## 2020-01-22 DIAGNOSIS — R0602 Shortness of breath: Secondary | ICD-10-CM | POA: Diagnosis not present

## 2020-01-22 DIAGNOSIS — Z880 Allergy status to penicillin: Secondary | ICD-10-CM

## 2020-01-22 DIAGNOSIS — N1831 Chronic kidney disease, stage 3a: Secondary | ICD-10-CM | POA: Diagnosis present

## 2020-01-22 DIAGNOSIS — Z9841 Cataract extraction status, right eye: Secondary | ICD-10-CM

## 2020-01-22 DIAGNOSIS — J449 Chronic obstructive pulmonary disease, unspecified: Secondary | ICD-10-CM | POA: Diagnosis not present

## 2020-01-22 DIAGNOSIS — I719 Aortic aneurysm of unspecified site, without rupture: Secondary | ICD-10-CM | POA: Diagnosis not present

## 2020-01-22 DIAGNOSIS — Z8673 Personal history of transient ischemic attack (TIA), and cerebral infarction without residual deficits: Secondary | ICD-10-CM | POA: Diagnosis not present

## 2020-01-22 DIAGNOSIS — K219 Gastro-esophageal reflux disease without esophagitis: Secondary | ICD-10-CM | POA: Diagnosis present

## 2020-01-22 DIAGNOSIS — R531 Weakness: Secondary | ICD-10-CM | POA: Diagnosis not present

## 2020-01-22 DIAGNOSIS — Z87892 Personal history of anaphylaxis: Secondary | ICD-10-CM

## 2020-01-22 DIAGNOSIS — Z743 Need for continuous supervision: Secondary | ICD-10-CM | POA: Diagnosis not present

## 2020-01-22 DIAGNOSIS — Z961 Presence of intraocular lens: Secondary | ICD-10-CM | POA: Diagnosis not present

## 2020-01-22 DIAGNOSIS — R0689 Other abnormalities of breathing: Secondary | ICD-10-CM | POA: Diagnosis not present

## 2020-01-22 DIAGNOSIS — R29810 Facial weakness: Secondary | ICD-10-CM | POA: Diagnosis present

## 2020-01-22 DIAGNOSIS — Z791 Long term (current) use of non-steroidal anti-inflammatories (NSAID): Secondary | ICD-10-CM | POA: Diagnosis not present

## 2020-01-22 DIAGNOSIS — Z7902 Long term (current) use of antithrombotics/antiplatelets: Secondary | ICD-10-CM | POA: Diagnosis not present

## 2020-01-22 DIAGNOSIS — J441 Chronic obstructive pulmonary disease with (acute) exacerbation: Secondary | ICD-10-CM | POA: Diagnosis present

## 2020-01-22 DIAGNOSIS — I7025 Atherosclerosis of native arteries of other extremities with ulceration: Secondary | ICD-10-CM | POA: Diagnosis not present

## 2020-01-22 DIAGNOSIS — F17210 Nicotine dependence, cigarettes, uncomplicated: Secondary | ICD-10-CM | POA: Diagnosis present

## 2020-01-22 DIAGNOSIS — Z90710 Acquired absence of both cervix and uterus: Secondary | ICD-10-CM

## 2020-01-22 DIAGNOSIS — J9691 Respiratory failure, unspecified with hypoxia: Secondary | ICD-10-CM | POA: Diagnosis present

## 2020-01-22 DIAGNOSIS — N1832 Chronic kidney disease, stage 3b: Secondary | ICD-10-CM | POA: Diagnosis present

## 2020-01-22 LAB — URINALYSIS, ROUTINE W REFLEX MICROSCOPIC
Bilirubin Urine: NEGATIVE
Glucose, UA: NEGATIVE mg/dL
Hgb urine dipstick: NEGATIVE
Ketones, ur: 5 mg/dL — AB
Leukocytes,Ua: NEGATIVE
Nitrite: NEGATIVE
Protein, ur: NEGATIVE mg/dL
Specific Gravity, Urine: 1.045 — ABNORMAL HIGH (ref 1.005–1.030)
pH: 5 (ref 5.0–8.0)

## 2020-01-22 LAB — COMPREHENSIVE METABOLIC PANEL
ALT: 9 U/L (ref 0–44)
AST: 18 U/L (ref 15–41)
Albumin: 4.1 g/dL (ref 3.5–5.0)
Alkaline Phosphatase: 41 U/L (ref 38–126)
Anion gap: 10 (ref 5–15)
BUN: 10 mg/dL (ref 8–23)
CO2: 26 mmol/L (ref 22–32)
Calcium: 9 mg/dL (ref 8.9–10.3)
Chloride: 106 mmol/L (ref 98–111)
Creatinine, Ser: 1.06 mg/dL — ABNORMAL HIGH (ref 0.44–1.00)
GFR calc Af Amer: 59 mL/min — ABNORMAL LOW (ref >60)
GFR calc non Af Amer: 51 mL/min — ABNORMAL LOW (ref >60)
Glucose, Bld: 104 mg/dL — ABNORMAL HIGH (ref 70–99)
Potassium: 3.4 mmol/L — ABNORMAL LOW (ref 3.5–5.1)
Sodium: 142 mmol/L (ref 135–145)
Total Bilirubin: 0.9 mg/dL (ref 0.3–1.2)
Total Protein: 7.4 g/dL (ref 6.5–8.1)

## 2020-01-22 LAB — CBC WITH DIFFERENTIAL/PLATELET
Abs Immature Granulocytes: 0.01 K/uL (ref 0.00–0.07)
Basophils Absolute: 0 K/uL (ref 0.0–0.1)
Basophils Relative: 1 %
Eosinophils Absolute: 0.4 K/uL (ref 0.0–0.5)
Eosinophils Relative: 8 %
HCT: 50.7 % — ABNORMAL HIGH (ref 36.0–46.0)
Hemoglobin: 16 g/dL — ABNORMAL HIGH (ref 12.0–15.0)
Immature Granulocytes: 0 %
Lymphocytes Relative: 31 %
Lymphs Abs: 1.7 K/uL (ref 0.7–4.0)
MCH: 30.9 pg (ref 26.0–34.0)
MCHC: 31.6 g/dL (ref 30.0–36.0)
MCV: 97.9 fL (ref 80.0–100.0)
Monocytes Absolute: 0.4 K/uL (ref 0.1–1.0)
Monocytes Relative: 8 %
Neutro Abs: 2.9 K/uL (ref 1.7–7.7)
Neutrophils Relative %: 52 %
Platelets: 159 K/uL (ref 150–400)
RBC: 5.18 MIL/uL — ABNORMAL HIGH (ref 3.87–5.11)
RDW: 13.6 % (ref 11.5–15.5)
WBC: 5.5 K/uL (ref 4.0–10.5)
nRBC: 0 % (ref 0.0–0.2)

## 2020-01-22 LAB — TROPONIN I (HIGH SENSITIVITY)
Troponin I (High Sensitivity): 40 ng/L — ABNORMAL HIGH (ref <18)
Troponin I (High Sensitivity): 37 ng/L — ABNORMAL HIGH (ref <18)

## 2020-01-22 LAB — SARS CORONAVIRUS 2 BY RT PCR (HOSPITAL ORDER, PERFORMED IN ~~LOC~~ HOSPITAL LAB): SARS Coronavirus 2: NEGATIVE

## 2020-01-22 LAB — D-DIMER, QUANTITATIVE: D-Dimer, Quant: 0.78 ug/mL-FEU — ABNORMAL HIGH (ref 0.00–0.50)

## 2020-01-22 MED ORDER — METRONIDAZOLE IN NACL 5-0.79 MG/ML-% IV SOLN
500.0000 mg | Freq: Three times a day (TID) | INTRAVENOUS | Status: DC
  Administered 2020-01-22 – 2020-01-24 (×4): 500 mg via INTRAVENOUS
  Filled 2020-01-22 (×5): qty 100

## 2020-01-22 MED ORDER — AMLODIPINE BESYLATE 5 MG PO TABS
10.0000 mg | ORAL_TABLET | Freq: Every day | ORAL | Status: DC
  Administered 2020-01-23 – 2020-01-24 (×2): 10 mg via ORAL
  Filled 2020-01-22 (×2): qty 2

## 2020-01-22 MED ORDER — ALBUTEROL SULFATE (2.5 MG/3ML) 0.083% IN NEBU
3.0000 mL | INHALATION_SOLUTION | Freq: Four times a day (QID) | RESPIRATORY_TRACT | Status: DC | PRN
  Administered 2020-01-23 – 2020-01-24 (×2): 3 mL via RESPIRATORY_TRACT
  Filled 2020-01-22 (×2): qty 3

## 2020-01-22 MED ORDER — METRONIDAZOLE IN NACL 5-0.79 MG/ML-% IV SOLN
500.0000 mg | Freq: Once | INTRAVENOUS | Status: AC
  Administered 2020-01-22: 500 mg via INTRAVENOUS
  Filled 2020-01-22: qty 100

## 2020-01-22 MED ORDER — SODIUM CHLORIDE 0.9 % IV SOLN: INTRAVENOUS | Status: DC

## 2020-01-22 MED ORDER — LEVOFLOXACIN IN D5W 750 MG/150ML IV SOLN
750.0000 mg | Freq: Once | INTRAVENOUS | Status: AC
  Administered 2020-01-22: 750 mg via INTRAVENOUS
  Filled 2020-01-22: qty 150

## 2020-01-22 MED ORDER — ENOXAPARIN SODIUM 40 MG/0.4ML ~~LOC~~ SOLN
40.0000 mg | SUBCUTANEOUS | Status: DC
  Administered 2020-01-22 – 2020-01-23 (×2): 40 mg via SUBCUTANEOUS
  Filled 2020-01-22 (×2): qty 0.4

## 2020-01-22 MED ORDER — METHYLPREDNISOLONE SODIUM SUCC 125 MG IJ SOLR
125.0000 mg | Freq: Once | INTRAMUSCULAR | Status: AC
  Administered 2020-01-22: 125 mg via INTRAVENOUS
  Filled 2020-01-22: qty 2

## 2020-01-22 MED ORDER — LOSARTAN POTASSIUM 50 MG PO TABS
100.0000 mg | ORAL_TABLET | Freq: Every day | ORAL | Status: DC
  Administered 2020-01-23 – 2020-01-24 (×2): 100 mg via ORAL
  Filled 2020-01-22 (×2): qty 2

## 2020-01-22 MED ORDER — IOHEXOL 350 MG/ML SOLN
75.0000 mL | Freq: Once | INTRAVENOUS | Status: AC | PRN
  Administered 2020-01-22: 75 mL via INTRAVENOUS

## 2020-01-22 MED ORDER — LOSARTAN POTASSIUM-HCTZ 100-25 MG PO TABS: 1.0000 | ORAL_TABLET | Freq: Every day | ORAL | Status: DC

## 2020-01-22 MED ORDER — IPRATROPIUM-ALBUTEROL 0.5-2.5 (3) MG/3ML IN SOLN
3.0000 mL | Freq: Once | RESPIRATORY_TRACT | Status: AC
  Administered 2020-01-22: 3 mL via RESPIRATORY_TRACT
  Filled 2020-01-22: qty 3

## 2020-01-22 MED ORDER — CLOPIDOGREL BISULFATE 75 MG PO TABS
75.0000 mg | ORAL_TABLET | Freq: Every day | ORAL | Status: DC
  Administered 2020-01-22 – 2020-01-24 (×3): 75 mg via ORAL
  Filled 2020-01-22 (×3): qty 1

## 2020-01-22 MED ORDER — LEVOFLOXACIN IN D5W 750 MG/150ML IV SOLN: 750.0000 mg | INTRAVENOUS | Status: DC

## 2020-01-22 MED ORDER — HYDROCHLOROTHIAZIDE 25 MG PO TABS
25.0000 mg | ORAL_TABLET | Freq: Every day | ORAL | Status: DC
  Administered 2020-01-23: 25 mg via ORAL
  Filled 2020-01-22: qty 1

## 2020-01-22 MED ORDER — ALBUTEROL SULFATE HFA 108 (90 BASE) MCG/ACT IN AERS
2.0000 | INHALATION_SPRAY | Freq: Once | RESPIRATORY_TRACT | Status: AC
  Administered 2020-01-22: 2 via RESPIRATORY_TRACT
  Filled 2020-01-22: qty 6.7

## 2020-01-22 NOTE — ED Notes (Signed)
Pulse ox while ambulated was 94%. BP after returning to bed was 179/79

## 2020-01-22 NOTE — ED Provider Notes (Signed)
[facility] EMERGENCY DEPARTMENT Provider Note   CSN: [id number] Arrival date & time: 01/22/20  1032     History Chief Complaint  Patient presents with  . Shortness of Breath    [name] is a 79 y.o. female with PMHx HTN, GERD, COPD, TIA who presents to the ED today via EMS with complaint of worsening shortness of breath x 5 days. EMS also reports productive cough with thick yellow sputum. Pt was satting 92% on RA and placed on 2L per EMS; satting 96% currently. Pt is not on O2 at home. She was given 5 of albuterol with improvement in her symptoms. Pt states that she has been using her albuterol inhaler every hour over the past week. She states these symptoms feel worse than her regular COPD exacerbations. She also reports some mild chest discomfort in the substernal area that she attributes to increased work of breathing. Pt denies any recent sick contacts. She has been vaccinated against COVID 19.   Pt also mentions she feels like she may have had a "mini stroke" Sunday night into Monday morning. She states she went to sleep around 10 PM Sunday 08/22 and felt fine. She woke up the next morning and noted left sided facial droop and left side weakness. She feels like the facial droop is getting better however still feels weak on the left side. Pt reports she went to walk her dog Monday morning without difficulty however the next thing she remembered was 2 of her neighbors linking arms with her and helping assist her to her home; she states they told her she passed out. Pt is currently on Plavix and denies missing any doses.   The history is provided by the patient and medical records.       Past Medical History:  Diagnosis Date  . Arthritis    OA  . GERD (gastroesophageal reflux disease)   . Headache(784.0)   . Hepatitis    history of Hepatitis 20 years ago; not sure what kind  . History of gout   . Hypertension   . Hypertensive encephalopathy   . Stroke (HCC)    SLIGHT RT  SIDE WEAKNESS 2001    Patient Active Problem List   Diagnosis Date Noted  . GAD (generalized anxiety disorder) 08/26/2019  . Osteopenia of multiple sites 08/26/2019  . Depression, recurrent (HCC) 04/21/2019  . Stage 3a chronic kidney disease 04/21/2019  . 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  . COPD with chronic bronchitis (HCC) 01/01/2019  . Hyperlipidemia 07/09/2016  . History of TIA (transient ischemic attack) 03/06/2016  . Cigarette nicotine dependence without complication 03/06/2016  . 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HCC) 02/13/2012  . Left-sided weakness 02/11/2012  . Essential hypertension with goal blood pressure less than 130/80 02/11/2012  . CALCANEAL SPUR 10/05/2009    Past Surgical History:  Procedure Laterality Date  . ABDOMINAL HYSTERECTOMY    . ANTERIOR CERVICAL CORPECTOMY  03/07/2012   Procedure: ANTERIOR CERVICAL CORPECTOMY;  Surgeon: Barnett Abu, MD;  Location: [facility];  Service: Neurosurgery;  Laterality: N/A;  Cervical six-seven, cervical seven-thoracic one Anterior cervical decompression/diskectomy/fusion, with Cervical seven Corpectomy, reconstruction using Allograft and Alphatec plate  . CATARACT EXTRACTION W/PHACO Left 04/12/2017   Procedure: CATARACT EXTRACTION PHACO AND INTRAOCULAR LENS PLACEMENT (IOC);  Surgeon: Fabio Pierce, MD;  Location: [facility];  Service: Ophthalmology;  Laterality: Left;  CDE: 3.82  . CATARACT EXTRACTION W/PHACO Right 05/03/2017   Procedure: CATARACT EXTRACTION PHACO AND INTRAOCULAR LENS PLACEMENT RIGHT EYE;  Surgeon: Fabio Pierce, MD;  Location: [facility];  Service: Ophthalmology;  Laterality: Right;  CDE: 3.89  . MULTIPLE TOOTH EXTRACTIONS    . TONSILLECTOMY       OB History   No obstetric history on file.     Family History  Adopted: Yes  Problem Relation Age of Onset  . Bipolar disorder Daughter   . Heart disease Daughter   . Asthma Daughter   . Bipolar disorder Son   . Bipolar disorder  Daughter   . Bipolar disorder Daughter   . Bipolar disorder Daughter   . Hypertension Daughter   . Heart disease Daughter   . Drug abuse Daughter        OD    Social History   Tobacco Use  . Smoking status: Current Some Day Smoker    Packs/day: 0.50    Years: 50.00    Pack years: 25.00    Types: Cigarettes  . Smokeless tobacco: Never Used  Vaping Use  . Vaping Use: Never used  Substance Use Topics  . Alcohol use: No  . Drug use: No    Home Medications Prior to Admission medications   Medication Sig Start Date End Date Taking? Authorizing Provider  amLODipine (NORVASC) 10 MG tablet Take 1 tablet (10 mg total) by mouth daily. 10/30/19  Yes Daphine Deutscher, Mary-Margaret, FNP  clopidogrel (PLAVIX) 75 MG tablet Take 1 tablet (75 mg total) by mouth daily. 09/25/19  Yes Deliah Boston F, FNP  losartan-hydrochlorothiazide (HYZAAR) 100-25 MG tablet Take 1 tablet by mouth daily. 10/27/18  Yes [provider]  meloxicam (MOBIC) 15 MG tablet Take 1 tablet (15 mg total) by mouth at bedtime. 12/18/19  Yes Gwenlyn Fudge, FNP  PROAIR HFA 108 [phone] Base) MCG/ACT inhaler Inhale 2 puffs into the lungs every 6 (six) hours as needed. 01/20/20  Yes Deliah Boston F, FNP  sertraline (ZOLOFT) 50 MG tablet Take 0.5 tablets (25 mg total) by mouth daily. 08/26/19   Sonny Masters, FNP    Allergies    Penicillins  Review of Systems   Review of Systems  Constitutional: Negative for chills, fatigue and fever.  Respiratory: Positive for cough and shortness of breath.   Cardiovascular: Positive for chest pain.  Gastrointestinal: Negative for abdominal pain, nausea and vomiting.  Neurological: Positive for syncope, facial asymmetry and weakness. Negative for headaches.  All other systems reviewed and are negative.   Physical Exam Updated Vital Signs Pulse 75   Temp 98.1 F (36.7 C) (Oral)   Resp 16   Ht 5' 3.75" (1.619 m)   Wt 56.2 kg   SpO2 96%   BMI 21.45 kg/m   Physical Exam Vitals and  nursing note reviewed.  Constitutional:      Appearance: She is not ill-appearing or diaphoretic.  HENT:     Head: Normocephalic and atraumatic.  Eyes:     Extraocular Movements: Extraocular movements intact.     Conjunctiva/sclera: Conjunctivae normal.     Pupils: Pupils are equal, round, and reactive to light.  Cardiovascular:     Rate and Rhythm: Normal rate and regular rhythm.     Pulses: Normal pulses.  Pulmonary:     Effort: Pulmonary effort is normal.     Breath sounds: Decreased breath sounds present. No wheezing, rhonchi or rales.     Comments: Currently on 2L [place] satting 96% on RA Chest:     Chest wall: No tenderness.  Abdominal:     Palpations: Abdomen is soft.  Tenderness: There is no abdominal tenderness. There is no guarding or rebound.  Musculoskeletal:     Cervical back: Neck supple.     Right lower leg: No edema.     Left lower leg: No edema.  Skin:    General: Skin is warm and dry.  Neurological:     Mental Status: She is alert.     Comments: CN 3-12 grossly intact A&O x4 GCS 15 Sensation intact throughout. Strength 4/5 to LLE; 5/5 to RLE. Strength equal to BUEs.  Coordination with finger-to-nose WNL Neg pronator drift     ED Results / Procedures / Treatments   Labs (all labs ordered are listed, but only abnormal results are displayed) Labs Reviewed  COMPREHENSIVE METABOLIC PANEL - Abnormal; Notable for the following components:      Result Value   Potassium 3.4 (*)    Glucose, Bld 104 (*)    Creatinine, Ser 1.06 (*)    GFR calc non Af Amer 51 (*)    GFR calc Af Amer 59 (*)    All other components within normal limits  CBC WITH DIFFERENTIAL/PLATELET - Abnormal; Notable for the following components:   RBC 5.18 (*)    Hemoglobin 16.0 (*)    HCT 50.7 (*)    All other components within normal limits  URINALYSIS, ROUTINE W REFLEX MICROSCOPIC - Abnormal; Notable for the following components:   Specific Gravity, Urine 1.045 (*)    Ketones, ur 5  (*)    All other components within normal limits  D-DIMER, QUANTITATIVE (NOT AT [facility]) - Abnormal; Notable for the following components:   D-Dimer, Quant 0.78 (*)    All other components within normal limits  TROPONIN I (HIGH SENSITIVITY) - Abnormal; Notable for the following components:   Troponin I (High Sensitivity) 40 (*)    All other components within normal limits  TROPONIN I (HIGH SENSITIVITY) - Abnormal; Notable for the following components:   Troponin I (High Sensitivity) 37 (*)    All other components within normal limits  SARS CORONAVIRUS 2 BY RT PCR (HOSPITAL ORDER, PERFORMED IN [facility])    EKG None  Radiology CT Angio Chest PE W/Cm &/Or Wo Cm  Result Date: 01/22/2020 CLINICAL DATA:  PE suspected. Positive D-dimer. Increased shortness of breath. EXAM: CT ANGIOGRAPHY CHEST WITH CONTRAST TECHNIQUE: Multidetector CT imaging of the chest was performed using the standard protocol during bolus administration of intravenous contrast. Multiplanar CT image reconstructions and MIPs were obtained to evaluate the vascular anatomy. CONTRAST:  59mL OMNIPAQUE IOHEXOL 350 MG/ML SOLN COMPARISON:  None. FINDINGS: Cardiovascular: Satisfactory opacification of the pulmonary arteries to the segmental level. No evidence of pulmonary embolism. Normal heart size. No pericardial effusion. No aortic aneurysm or dissection. Moderate atherosclerosis of the aorta. There are two penetrating atherosclerotic ulcers along the posterior aspect of the descending aorta which measures up to 4 mm (series 5, image 316 and series 5, image 318). Additional outpouching along the lateral aspect of the proximal descending aorta (series 5, image 100) is favored to represent normal variant/ductus bump. Mediastinum/Nodes: No enlarged mediastinal, hilar, or axillary lymph nodes. Thyroid gland, trachea, and esophagus demonstrate no significant findings. r Lungs/Pleura: There is moderate centrilobular emphysema, most  pronounced at the apices. Mild diffuse bronchial wall thickening with areas of mucous plugging. No effusions. No pneumothorax. Upper Abdomen: Multiple low-attenuation liver lesions, incompletely characterized, but likely cysts. Normal adrenal glands. No hydronephrosis. Small hypodense renal lesions, incompletely characterized but likely cysts. Musculoskeletal: Partially imaged anterior  cervicothoracic fusion. Multilevel degenerative changes of the spine. No acute osseous abnormality. Bulky anteriorly directed cervical osteophytes, partially imaged Review of the MIP images confirms the above findings. IMPRESSION: 1. No evidence of acute pulmonary embolism. 2. Moderate emphysema. Diffuse bronchial wall thickening with areas of debris in bronchioles in the right lower lobe, concerning for mild aspiration. No consolidation. 3. Two likely penetrating atherosclerotic ulcers involving the posterior aspect of the descending aorta measuring up to 4 mm in thickness, as detailed above. No evidence of mural hemorrhage, dissection or aneurysm. If the patient's symtoms are not referable, recommend outpatient vascular surgery consultation for management and follow up. Electronically Signed   By: Feliberto HartsFrederick S Jones MD   On: 01/22/2020 15:02   MR BRAIN WO CONTRAST  Result Date: 01/22/2020 CLINICAL DATA:  Left-sided weakness EXAM: MRI HEAD WITHOUT CONTRAST TECHNIQUE: Multiplanar, multiecho pulse sequences of the brain and surrounding structures were obtained without intravenous contrast. COMPARISON:  2013 FINDINGS: Brain: There is no acute infarction or intracranial hemorrhage. There is no intracranial mass, mass effect, or edema. There is no hydrocephalus or extra-axial fluid collection. Ventricles and sulci are within normal limits in size and configuration. Patchy and confluent areas of T2 hyperintensity in the supratentorial white matter are nonspecific but probably reflect mild to moderate chronic microvascular ischemic  changes similar to the prior study. Vascular: Major vessel flow voids at the skull base are preserved. Skull and upper cervical spine: Normal marrow signal is preserved. Sinuses/Orbits: Minor mucosal thickening. Bilateral lens replacements. Other: Sella is unremarkable.  Mastoid air cells are clear. IMPRESSION: No evidence of recent infarction, hemorrhage, or mass. Chronic microvascular ischemic changes similar to 2013 study. Electronically Signed   By: Guadlupe SpanishPraneil  Patel M.D.   On: 01/22/2020 12:21   DG Chest Port 1 View  Result Date: 01/22/2020 CLINICAL DATA:  Shortness of breath EXAM: PORTABLE CHEST 1 VIEW COMPARISON:  02/11/2012 FINDINGS: The heart size and mediastinal contours are within normal limits. Aortic atherosclerosis. Both lungs are clear. No pleural effusion or pneumothorax. The visualized skeletal structures are unremarkable. Partially imaged cervical fusion hardware. Polyarticular degenerative change. IMPRESSION: No acute cardiopulmonary disease. Electronically Signed   By: Feliberto HartsFrederick S Jones MD   On: 01/22/2020 11:30    Procedures Procedures (including critical care time)  Medications Ordered in ED Medications  levofloxacin (LEVAQUIN) IVPB 750 mg (has no administration in time range)  metroNIDAZOLE (FLAGYL) IVPB 500 mg (has no administration in time range)  methylPREDNISolone sodium succinate (SOLU-MEDROL) 125 mg/2 mL injection 125 mg (125 mg Intravenous Given 01/22/20 1115)  albuterol (VENTOLIN HFA) 108 (90 Base) MCG/ACT inhaler 2 puff (2 puffs Inhalation Given 01/22/20 1215)  iohexol (OMNIPAQUE) 350 MG/ML injection 75 mL (75 mLs Intravenous Contrast Given 01/22/20 1243)  ipratropium-albuterol (DUONEB) 0.5-2.5 (3) MG/3ML nebulizer solution 3 mL (3 mLs Nebulization Given 01/22/20 1505)    ED Course  I have reviewed the triage vital signs and the nursing notes.  Pertinent labs & imaging results that were available during my care of the patient were reviewed by me and considered in my  medical decision making (see chart for details).    MDM Rules/Calculators/A&P                          79 year old female who presents with complaint of shortness of breath and productive cough with yellow sputum.  Hx COPD, has been using her albuterol inhaler more frequently.  Found to be 92% on room air with EMS.  Not  on home O2. EMS reports the patient appeared to be with increased work of breathing and placed on 2 L O2.  She was given 5 of albuterol with improvement.  On arrival to the ED patient is afebrile nontachycardic nontachypneic.  She is satting 96% on 2 L and appears to be in no acute distress.  She does have diminished breath sounds throughout however no apparent wheezing.  Asked if patient was having chest pain she reports that she believes she had a TIA 5 days ago.  History of TIA.  Last known normal Sunday night at 10 PM.  Reports that she had left-sided facial droop as well as left-sided weakness, no obvious facial droop appreciated on my exam.  Patient has 4 out of 5 strength in her left lower extremity, equal strength in her bilateral upper extremities.  Given concern as well as symptoms starting 5 days ago will obtain MRI brain to assess for any ischemia.  Patient briefly mentions that she also passed out on Monday, no prodrome.  Also reports some mild chest pain.  Given this will obtain D-dimer at this time, patient is on antiplatelet therapy however no anticoagulation.  Obtain EKG, chest x-ray, troponin as well.  We will continue to monitor.  Solu-Medrol and albuterol inhaler given for patient for symptomatic relief.  Chest x-ray negative. CBC without leukocytosis.  Hemoglobin stable however slightly elevated at 16.0. CMP with potassium of 3.4, creatinine stable at 1.06.  Glucose 104.  No other electrolyte abnormalities. Troponin 40, will repeat. D-dimer elevated at 0.78, I am unable to age-adjusted given patient is 79. Will obtain CTA.   MR negative for acute findings Repeat  troponin decreased at 37  CTA with findings concerning for aspiration PNA in RLL. PT also with 2 atherosclerotic ulcers of the aorta; recommend vascular surgery outpatient follow up. Given symptoms of SOB and cough and findings on CT scan feel pt needs to be admitted for IV abx. She has severe PCN allergy and it does not appear she has had cephalosporins in our system since 2013. Will order IV levaquin and flagyl. Creatinine clearance 39; recommend levaquin q 48 hours.   Discussed case with attending physician Dr. Jodi Mourning; he has evaluated patient as well and agrees with plan.   Dr. Adrian Blackwater with [facility] to evaluate for admission.   This note was prepared using Dragon voice recognition software and may include unintentional dictation errors due to the inherent limitations of voice recognition software.  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

## 2020-01-22 NOTE — ED Triage Notes (Signed)
Pt arrived via ems for increased SOB, progressive over last 5 days. Reports thick yellow sputum. Has hx of COPD. EMS gave 5 of albuterol which was effective. Reports feeling much better.

## 2020-01-22 NOTE — ED Provider Notes (Signed)
Shared service with APP.  I have personally seen and examined the patient, providing direct face to face care.  Physical exam findings and plan include patient presents with worsening shortness of breath and cough for the past 4 to 5 days.  Productive sputum.  On exam patient has decreased air movement bilateral, no focal neuro deficits this time however patient did have unilateral weakness and history of TIA.  Plan for blood work, breathing treatment, steroids, MRI brain and reassessment.  Currently patient on 1 L nasal cannula not on oxygen at home.  Covid test pending. Difficult IV and needed 20 g for CT angiogram. Placed by myself.   No diagnosis found.   Ultrasound ED Peripheral IV (Provider)  Date/Time: 01/22/2020 2:06 PM Performed by: Blane Ohara, MD Authorized by: Blane Ohara, MD   Procedure details:    Indications: multiple failed IV attempts     Location:  Right AC   Angiocath:  20 G   Bedside Ultrasound Guided: Yes     Images: archived     Patient tolerated procedure without complications: Yes        Blane Ohara, MD 01/22/20 1553

## 2020-01-22 NOTE — Progress Notes (Signed)
Pharmacy Antibiotic Note  [name] is a 79 y.o. female admitted on 01/22/2020 with pneumonia.  Pharmacy has been consulted for levofloxacin dosing.  Plan: Start Levaquin 750mg  IV q48h Monitor patient's renal function, cultures and progress.  Height: 5' 3.75" (161.9 cm) Weight: 56.2 kg (124 lb) IBW/kg (Calculated) : 54.13  Temp (24hrs), Avg:98 F (36.7 C), Min:97.9 F (36.6 C), Max:98.1 F (36.7 C)  Recent Labs  Lab 01/22/20 1110  WBC 5.5  CREATININE 1.06*    Estimated Creatinine Clearance: 38 mL/min (A) (by C-G formula based on SCr of 1.06 mg/dL (H)).    Allergies  Allergen Reactions  . Penicillins Anaphylaxis, Swelling, Rash and Other (See Comments)    Has patient had a PCN reaction causing immediate rash, facial/tongue/throat swelling, SOB or lightheadedness with hypotension: yes Has patient had a PCN reaction causing severe rash involving mucus membranes or skin necrosis: no Has patient had a PCN reaction that required hospitalization: yes Has patient had a PCN reaction occurring within the last 10 years: no If all of the above answers are "NO", then may proceed with Cephalosporin use.     Antimicrobials this admission: Levofloxacin 8/27 >>   Dose adjustments this admission: levofloxacin  Microbiology results:  8/27 Resp PCR: SARS CoV-2 negative    Thank you for allowing pharmacy to be a part of this patient's care.  9/27 01/22/2020 4:31 PM

## 2020-01-22 NOTE — ED Notes (Signed)
Paged respiratory 

## 2020-01-22 NOTE — H&P (Signed)
History and Physical  JENEFER Cisneros UJW:[id number] DOB: Nov 23, 1940 DOA: 01/22/2020  Referring physician: Tanda Rockers, PA-C, EDP PCP: Gwenlyn Fudge, FNP  Outpatient Specialists:   Patient Coming From: home  Chief Complaint: SOB  HPI: [name] is a 79 y.o. 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  Patient presents with 5-day history of worsening shortness of breath and dyspnea on exertion.  Symptoms are worsening.  Dyspneic to approximately 30 feet.  Does report waking up often this week with coughing, gagging.  Denies aspiration. Had a breathing treatment here, which was helpful. No other palliating or provoking factors.   Emergency Department Course: CTA neg for PE. Does show atherosclerotic ulcers in aorta. Also shows aspiration pneumonia.  Given Flagyl and Levaquin  Review of Systems:   Pt denies any fevers, chills, nausea, vomiting, diarrhea, constipation, abdominal pain, palpitations, headache, vision changes, lightheadedness, dizziness, melena, rectal bleeding.  Review of systems are otherwise negative  Past Medical History:  Diagnosis Date  . Arthritis    OA  . GERD (gastroesophageal reflux disease)   . Headache(784.0)   . Hepatitis    history of Hepatitis 20 years ago; not sure what kind  . History of gout   . Hypertension   . Hypertensive encephalopathy   . Stroke (HCC)    SLIGHT RT SIDE WEAKNESS 2001   Past Surgical History:  Procedure Laterality Date  . ABDOMINAL HYSTERECTOMY    . ANTERIOR CERVICAL CORPECTOMY  03/07/2012   Procedure: ANTERIOR CERVICAL CORPECTOMY;  Surgeon: Barnett Abu, MD;  Location: [facility];  Service: Neurosurgery;  Laterality: N/A;  Cervical six-seven, cervical seven-thoracic one Anterior cervical decompression/diskectomy/fusion, with Cervical seven Corpectomy, reconstruction using Allograft and Alphatec plate  . CATARACT EXTRACTION W/PHACO Left 04/12/2017   Procedure:  CATARACT EXTRACTION PHACO AND INTRAOCULAR LENS PLACEMENT (IOC);  Surgeon: Fabio Pierce, MD;  Location: [facility];  Service: Ophthalmology;  Laterality: Left;  CDE: 3.82  . CATARACT EXTRACTION W/PHACO Right 05/03/2017   Procedure: CATARACT EXTRACTION PHACO AND INTRAOCULAR LENS PLACEMENT RIGHT EYE;  Surgeon: Fabio Pierce, MD;  Location: [facility];  Service: Ophthalmology;  Laterality: Right;  CDE: 3.89  . MULTIPLE TOOTH EXTRACTIONS    . TONSILLECTOMY     Social History:  reports that she has been smoking cigarettes. She has a 25.00 pack-year smoking history. She has never used smokeless tobacco. She reports that she does not drink alcohol and does not use drugs. Patient lives at home  Allergies  Allergen Reactions  . Penicillins Anaphylaxis, Swelling, Rash and Other (See Comments)    Has patient had a PCN reaction causing immediate rash, facial/tongue/throat swelling, SOB or lightheadedness with hypotension: yes Has patient had a PCN reaction causing severe rash involving mucus membranes or skin necrosis: no Has patient had a PCN reaction that required hospitalization: yes Has patient had a PCN reaction occurring within the last 10 years: no If all of the above answers are "NO", then may proceed with Cephalosporin use.     Family History  Adopted: Yes  Problem Relation Age of Onset  . Bipolar disorder Daughter   . Heart disease Daughter   . Asthma Daughter   . Bipolar disorder Son   . Bipolar disorder Daughter   . Bipolar disorder Daughter   . Bipolar disorder Daughter   . Hypertension Daughter   . Heart disease Daughter   . Drug abuse Daughter        OD  Prior to Admission medications   Medication Sig Start Date End Date Taking? Authorizing Provider  amLODipine (NORVASC) 10 MG tablet Take 1 tablet (10 mg total) by mouth daily. 10/30/19  Yes Daphine Deutscher, Mary-Margaret, FNP  clopidogrel (PLAVIX) 75 MG tablet Take 1 tablet (75 mg total) by mouth daily. 09/25/19  Yes Deliah Boston F, FNP    losartan-hydrochlorothiazide (HYZAAR) 100-25 MG tablet Take 1 tablet by mouth daily. 10/27/18  Yes [provider]  meloxicam (MOBIC) 15 MG tablet Take 1 tablet (15 mg total) by mouth at bedtime. 12/18/19  Yes Gwenlyn Fudge, FNP  PROAIR HFA 108 [phone] Base) MCG/ACT inhaler Inhale 2 puffs into the lungs every 6 (six) hours as needed. 01/20/20  Yes Deliah Boston F, FNP  sertraline (ZOLOFT) 50 MG tablet Take 0.5 tablets (25 mg total) by mouth daily. 08/26/19   Sonny Masters, FNP    Physical Exam: BP (!) 179/79   Pulse 78   Temp 98.1 F (36.7 C) (Oral)   Resp 12   Ht 5' 3.75" (1.619 m)   Wt 56.2 kg   SpO2 98%   BMI 21.45 kg/m   . General: Elderly female. Awake and alert and oriented x3. No acute cardiopulmonary distress.  [name] HEENT: Normocephalic atraumatic.  Right and left ears normal in appearance.  Pupils equal, round, reactive to light. Extraocular muscles are intact. Sclerae anicteric and noninjected.  Moist mucosal membranes. No mucosal lesions.  . Neck: Neck supple without lymphadenopathy. No carotid bruits. No masses palpated.  . Cardiovascular: Regular rate with normal S1-S2 sounds. No murmurs, rubs, gallops auscultated. No JVD.  [name] Respiratory: Good respiratory effort with no wheezes, rales, rhonchi. Lungs clear to auscultation bilaterally.  No accessory muscle use. . Abdomen: Soft, nontender, nondistended. Active bowel sounds. No masses or hepatosplenomegaly  . Skin: No rashes, lesions, or ulcerations.  Dry, warm to touch. 2+ dorsalis pedis and radial pulses. . Musculoskeletal: No calf or leg pain. All major joints not erythematous nontender.  No upper or lower joint deformation.  Good ROM.  No contractures  . Psychiatric: Intact judgment and insight. Pleasant and cooperative. . Neurologic: No focal neurological deficits. Strength is 5/5 and symmetric in upper and lower extremities.  Cranial nerves II through XII are grossly intact.           Labs on Admission: I have  personally reviewed following labs and imaging studies  CBC: Recent Labs  Lab 01/22/20 1110  WBC 5.5  NEUTROABS 2.9  HGB 16.0*  HCT 50.7*  MCV 97.9  PLT 159   Basic Metabolic Panel: Recent Labs  Lab 01/22/20 1110  NA 142  K 3.4*  CL 106  CO2 26  GLUCOSE 104*  BUN 10  CREATININE 1.06*  CALCIUM 9.0   GFR: Estimated Creatinine Clearance: 38 mL/min (A) (by C-G formula based on SCr of 1.06 mg/dL (H)). Liver Function Tests: Recent Labs  Lab 01/22/20 1110  AST 18  ALT 9  ALKPHOS 41  BILITOT 0.9  PROT 7.4  ALBUMIN 4.1   No results for input(s): LIPASE, AMYLASE in the last 168 hours. No results for input(s): AMMONIA in the last 168 hours. Coagulation Profile: No results for input(s): INR, PROTIME in the last 168 hours. Cardiac Enzymes: No results for input(s): CKTOTAL, CKMB, CKMBINDEX, TROPONINI in the last 168 hours. BNP (last 3 results) No results for input(s): PROBNP in the last 8760 hours. HbA1C: No results for input(s): HGBA1C in the last 72 hours. CBG: No results for input(s): GLUCAP  in the last 168 hours. Lipid Profile: No results for input(s): CHOL, HDL, LDLCALC, TRIG, CHOLHDL, LDLDIRECT in the last 72 hours. Thyroid Function Tests: No results for input(s): TSH, T4TOTAL, FREET4, T3FREE, THYROIDAB in the last 72 hours. Anemia Panel: No results for input(s): VITAMINB12, FOLATE, FERRITIN, TIBC, IRON, RETICCTPCT in the last 72 hours. Urine analysis:    Component Value Date/Time   COLORURINE YELLOW 01/22/2020 1500   APPEARANCEUR CLEAR 01/22/2020 1500   LABSPEC 1.045 (H) 01/22/2020 1500   PHURINE 5.0 01/22/2020 1500   GLUCOSEU NEGATIVE 01/22/2020 1500   HGBUR NEGATIVE 01/22/2020 1500   BILIRUBINUR NEGATIVE 01/22/2020 1500   KETONESUR 5 (A) 01/22/2020 1500   PROTEINUR NEGATIVE 01/22/2020 1500   UROBILINOGEN 0.2 02/11/2012 1600   NITRITE NEGATIVE 01/22/2020 1500   LEUKOCYTESUR NEGATIVE 01/22/2020 1500   Sepsis  Labs: @LABRCNTIP (procalcitonin:4,lacticidven:4) ) Recent Results (from the past 240 hour(s))  SARS Coronavirus 2 by RT PCR (hospital order, performed in [facility] lab) Nasopharyngeal Nasopharyngeal Swab     Status: None   Collection Time: 01/22/20 11:07 AM   Specimen: Nasopharyngeal Swab  Result Value Ref Range Status   SARS Coronavirus 2 NEGATIVE NEGATIVE Final    Comment: (NOTE) SARS-CoV-2 target nucleic acids are NOT DETECTED.  The SARS-CoV-2 RNA is generally detectable in upper and lower respiratory specimens during the acute phase of infection. The lowest concentration of SARS-CoV-2 viral copies this assay can detect is 250 copies / mL. A negative result does not preclude SARS-CoV-2 infection and should not be used as the sole basis for treatment or other patient management decisions.  A negative result may occur with improper specimen collection / handling, submission of specimen other than nasopharyngeal swab, presence of viral mutation(s) within the areas targeted by this assay, and inadequate number of viral copies (<250 copies / mL). A negative result must be combined with clinical observations, patient history, and epidemiological information.  Fact Sheet for Patients:   BoilerBrush.com.cyhttps://www.fda.gov/media/136312/download  Fact Sheet for Healthcare Providers: https://pope.com/https://www.fda.gov/media/136313/download  This test is not yet approved or  cleared by the [place] FDA and has been authorized for detection and/or diagnosis of SARS-CoV-2 by FDA under an Emergency Use Authorization (EUA).  This EUA will remain in effect (meaning this test can be used) for the duration of the COVID-19 declaration under Section 564(b)(1) of the Act, 21 U.S.C. section 360bbb-3(b)(1), unless the authorization is terminated or revoked sooner.  Performed at [facility], [address] [place], [place] [address]      Radiological Exams on Admission: CT Angio Chest PE W/Cm &/Or Wo  Cm  Result Date: 01/22/2020 CLINICAL DATA:  PE suspected. Positive D-dimer. Increased shortness of breath. EXAM: CT ANGIOGRAPHY CHEST WITH CONTRAST TECHNIQUE: Multidetector CT imaging of the chest was performed using the standard protocol during bolus administration of intravenous contrast. Multiplanar CT image reconstructions and MIPs were obtained to evaluate the vascular anatomy. CONTRAST:  75mL OMNIPAQUE IOHEXOL 350 MG/ML SOLN COMPARISON:  None. FINDINGS: Cardiovascular: Satisfactory opacification of the pulmonary arteries to the segmental level. No evidence of pulmonary embolism. Normal heart size. No pericardial effusion. No aortic aneurysm or dissection. Moderate atherosclerosis of the aorta. There are two penetrating atherosclerotic ulcers along the posterior aspect of the descending aorta which measures up to 4 mm (series 5, image 316 and series 5, image 318). Additional outpouching along the lateral aspect of the proximal descending aorta (series 5, image 100) is favored to represent normal variant/ductus bump. Mediastinum/Nodes: No enlarged mediastinal, hilar, or axillary lymph nodes.  Thyroid gland, trachea, and esophagus demonstrate no significant findings. r Lungs/Pleura: There is moderate centrilobular emphysema, most pronounced at the apices. Mild diffuse bronchial wall thickening with areas of mucous plugging. No effusions. No pneumothorax. Upper Abdomen: Multiple low-attenuation liver lesions, incompletely characterized, but likely cysts. Normal adrenal glands. No hydronephrosis. Small hypodense renal lesions, incompletely characterized but likely cysts. Musculoskeletal: Partially imaged anterior cervicothoracic fusion. Multilevel degenerative changes of the spine. No acute osseous abnormality. Bulky anteriorly directed cervical osteophytes, partially imaged Review of the MIP images confirms the above findings. IMPRESSION: 1. No evidence of acute pulmonary embolism. 2. Moderate emphysema. Diffuse  bronchial wall thickening with areas of debris in bronchioles in the right lower lobe, concerning for mild aspiration. No consolidation. 3. Two likely penetrating atherosclerotic ulcers involving the posterior aspect of the descending aorta measuring up to 4 mm in thickness, as detailed above. No evidence of mural hemorrhage, dissection or aneurysm. If the patient's symtoms are not referable, recommend outpatient vascular surgery consultation for management and follow up. Electronically Signed   By: Feliberto Harts MD   On: 01/22/2020 15:02   MR BRAIN WO CONTRAST  Result Date: 01/22/2020 CLINICAL DATA:  Left-sided weakness EXAM: MRI HEAD WITHOUT CONTRAST TECHNIQUE: Multiplanar, multiecho pulse sequences of the brain and surrounding structures were obtained without intravenous contrast. COMPARISON:  2013 FINDINGS: Brain: There is no acute infarction or intracranial hemorrhage. There is no intracranial mass, mass effect, or edema. There is no hydrocephalus or extra-axial fluid collection. Ventricles and sulci are within normal limits in size and configuration. Patchy and confluent areas of T2 hyperintensity in the supratentorial white matter are nonspecific but probably reflect mild to moderate chronic microvascular ischemic changes similar to the prior study. Vascular: Major vessel flow voids at the skull base are preserved. Skull and upper cervical spine: Normal marrow signal is preserved. Sinuses/Orbits: Minor mucosal thickening. Bilateral lens replacements. Other: Sella is unremarkable.  Mastoid air cells are clear. IMPRESSION: No evidence of recent infarction, hemorrhage, or mass. Chronic microvascular ischemic changes similar to 2013 study. Electronically Signed   By: Guadlupe Spanish M.D.   On: 01/22/2020 12:21   DG Chest Port 1 View  Result Date: 01/22/2020 CLINICAL DATA:  Shortness of breath EXAM: PORTABLE CHEST 1 VIEW COMPARISON:  02/11/2012 FINDINGS: The heart size and mediastinal contours are  within normal limits. Aortic atherosclerosis. Both lungs are clear. No pleural effusion or pneumothorax. The visualized skeletal structures are unremarkable. Partially imaged cervical fusion hardware. Polyarticular degenerative change. IMPRESSION: No acute cardiopulmonary disease. Electronically Signed   By: Feliberto Harts MD   On: 01/22/2020 11:30    EKG: Independently reviewed.  Sinus rhythm with right atrial enlargement.  No acute ST changes.  Assessment/Plan: Active Problems:   Essential hypertension with goal blood pressure less than 130/80   History of TIA (transient ischemic attack)   COPD with chronic bronchitis (HCC)   Stage 3a chronic kidney disease   Aspiration pneumonia (HCC)   Atherosclerotic ulcer of aorta (HCC)    This patient was discussed with the ED physician, including pertinent vitals, physical exam findings, labs, and imaging.  We also discussed care given by the ED provider.  1. Aspiration pneumonia a. Observation b. Continue Levaquin and Flagyl. c. Speech therapy consult d. N.p.o. for now 2. COPD a. No evidence of wheezing.  Patient did receive Solu-Medrol in the ED. 3. History of TIA a. Patient thought she had mini stroke symptoms earlier in the week.  MRI negative for acute findings 4. Stage III  chronic kidney disease a. Stable 5. Hypertension a. Continue antihypertensives 6. Atherosclerotic ulcer a. Does not appear to be contributing to patient's symptoms.  Will need follow-up with vascular surgery following admission   DVT prophylaxis: Lovenox Consultants:  Code Status: Full code confirmed by patient Family Communication: None Disposition Plan: Patient should be able to return home   Levie Heritage, DO

## 2020-01-23 DIAGNOSIS — I719 Aortic aneurysm of unspecified site, without rupture: Secondary | ICD-10-CM | POA: Diagnosis present

## 2020-01-23 DIAGNOSIS — I10 Essential (primary) hypertension: Secondary | ICD-10-CM

## 2020-01-23 DIAGNOSIS — Z720 Tobacco use: Secondary | ICD-10-CM | POA: Diagnosis present

## 2020-01-23 DIAGNOSIS — I739 Peripheral vascular disease, unspecified: Secondary | ICD-10-CM | POA: Diagnosis present

## 2020-01-23 DIAGNOSIS — J9601 Acute respiratory failure with hypoxia: Secondary | ICD-10-CM | POA: Diagnosis not present

## 2020-01-23 DIAGNOSIS — Z8249 Family history of ischemic heart disease and other diseases of the circulatory system: Secondary | ICD-10-CM | POA: Diagnosis not present

## 2020-01-23 DIAGNOSIS — Z825 Family history of asthma and other chronic lower respiratory diseases: Secondary | ICD-10-CM | POA: Diagnosis not present

## 2020-01-23 DIAGNOSIS — Z981 Arthrodesis status: Secondary | ICD-10-CM | POA: Diagnosis not present

## 2020-01-23 DIAGNOSIS — Z87892 Personal history of anaphylaxis: Secondary | ICD-10-CM | POA: Diagnosis not present

## 2020-01-23 DIAGNOSIS — Z961 Presence of intraocular lens: Secondary | ICD-10-CM | POA: Diagnosis present

## 2020-01-23 DIAGNOSIS — Z9841 Cataract extraction status, right eye: Secondary | ICD-10-CM | POA: Diagnosis not present

## 2020-01-23 DIAGNOSIS — Z79899 Other long term (current) drug therapy: Secondary | ICD-10-CM | POA: Diagnosis not present

## 2020-01-23 DIAGNOSIS — J690 Pneumonitis due to inhalation of food and vomit: Secondary | ICD-10-CM | POA: Diagnosis not present

## 2020-01-23 DIAGNOSIS — I129 Hypertensive chronic kidney disease with stage 1 through stage 4 chronic kidney disease, or unspecified chronic kidney disease: Secondary | ICD-10-CM | POA: Diagnosis present

## 2020-01-23 DIAGNOSIS — Z791 Long term (current) use of non-steroidal anti-inflammatories (NSAID): Secondary | ICD-10-CM | POA: Diagnosis not present

## 2020-01-23 DIAGNOSIS — I700 Atherosclerosis of aorta: Secondary | ICD-10-CM

## 2020-01-23 DIAGNOSIS — M1990 Unspecified osteoarthritis, unspecified site: Secondary | ICD-10-CM | POA: Diagnosis present

## 2020-01-23 DIAGNOSIS — J441 Chronic obstructive pulmonary disease with (acute) exacerbation: Secondary | ICD-10-CM | POA: Diagnosis not present

## 2020-01-23 DIAGNOSIS — J9691 Respiratory failure, unspecified with hypoxia: Secondary | ICD-10-CM | POA: Diagnosis present

## 2020-01-23 DIAGNOSIS — N1831 Chronic kidney disease, stage 3a: Secondary | ICD-10-CM | POA: Diagnosis present

## 2020-01-23 DIAGNOSIS — Z880 Allergy status to penicillin: Secondary | ICD-10-CM | POA: Diagnosis not present

## 2020-01-23 DIAGNOSIS — Z7902 Long term (current) use of antithrombotics/antiplatelets: Secondary | ICD-10-CM | POA: Diagnosis not present

## 2020-01-23 DIAGNOSIS — I69351 Hemiplegia and hemiparesis following cerebral infarction affecting right dominant side: Secondary | ICD-10-CM | POA: Diagnosis not present

## 2020-01-23 DIAGNOSIS — Z20822 Contact with and (suspected) exposure to covid-19: Secondary | ICD-10-CM | POA: Diagnosis present

## 2020-01-23 DIAGNOSIS — Z9842 Cataract extraction status, left eye: Secondary | ICD-10-CM | POA: Diagnosis not present

## 2020-01-23 DIAGNOSIS — K219 Gastro-esophageal reflux disease without esophagitis: Secondary | ICD-10-CM | POA: Diagnosis present

## 2020-01-23 DIAGNOSIS — Z90710 Acquired absence of both cervix and uterus: Secondary | ICD-10-CM | POA: Diagnosis not present

## 2020-01-23 LAB — BASIC METABOLIC PANEL
Anion gap: 8 (ref 5–15)
BUN: 14 mg/dL (ref 8–23)
CO2: 25 mmol/L (ref 22–32)
Calcium: 8.6 mg/dL — ABNORMAL LOW (ref 8.9–10.3)
Chloride: 107 mmol/L (ref 98–111)
Creatinine, Ser: 1.16 mg/dL — ABNORMAL HIGH (ref 0.44–1.00)
GFR calc Af Amer: 53 mL/min — ABNORMAL LOW (ref >60)
GFR calc non Af Amer: 45 mL/min — ABNORMAL LOW (ref >60)
Glucose, Bld: 105 mg/dL — ABNORMAL HIGH (ref 70–99)
Potassium: 4.3 mmol/L (ref 3.5–5.1)
Sodium: 140 mmol/L (ref 135–145)

## 2020-01-23 LAB — CBC
HCT: 49 % — ABNORMAL HIGH (ref 36.0–46.0)
Hemoglobin: 15.2 g/dL — ABNORMAL HIGH (ref 12.0–15.0)
MCH: 30.6 pg (ref 26.0–34.0)
MCHC: 31 g/dL (ref 30.0–36.0)
MCV: 98.8 fL (ref 80.0–100.0)
Platelets: 146 10*3/uL — ABNORMAL LOW (ref 150–400)
RBC: 4.96 MIL/uL (ref 3.87–5.11)
RDW: 13.2 % (ref 11.5–15.5)
WBC: 4.8 10*3/uL (ref 4.0–10.5)
nRBC: 0 % (ref 0.0–0.2)

## 2020-01-23 MED ORDER — ATORVASTATIN CALCIUM 40 MG PO TABS
40.0000 mg | ORAL_TABLET | Freq: Every day | ORAL | Status: DC
  Administered 2020-01-23 – 2020-01-24 (×2): 40 mg via ORAL
  Filled 2020-01-23 (×2): qty 1

## 2020-01-23 NOTE — Progress Notes (Signed)
Patient Demographics:    [name], is a 79 y.o. female, DOB - [birth year], RWE:[id number]  Admit date - 01/22/2020   Admitting Physician Nelissa Bolduc Mariea Clonts, MD  Outpatient Primary MD for the patient is Gwenlyn Fudge, FNP  LOS - 0   Chief Complaint  Patient presents with  . 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   Acute respiratory failure with hypoxia (HCC)   Tobacco abuse   Respiratory failure with hypoxia (HCC)  Brief Summary:- 79 year old with past medical history relevant for HTN, COPD, prior stroke with residual right-sided hemiparesis and ongoing tobacco abuse admitted on 01/22/2020 with acute hypoxic respiratory failure secondary to presumed right-sided aspiration pneumonia and COPD exacerbation  A/p 1) acute hypoxic respiratory failure--- suspect due to combination of COPD extubation and possible aspiration pneumonia --History of anaphylaxis to penicillin, no history of prior exposure to cephalosporins -Currently on Levaquin and Flagyl, continue bronchodilators and supplemental oxygen at 3 L/min continuously -May need home O2 -Smoking cessation strongly advised -add IV Solu-Medrol for COPD exacerbation  2)HTN--- continue amlodipine 10 mg daily and losartan 100 mg daily, stop HCTZ due to electrolyte abnormalities  3)Atherosclerotic  ulcer of aorta -posterior aspect of the descending aorta measuring up to 4 mm in thickness----CTA chest shows Two likely penetrating atherosclerotic ulcers involving the posterior aspect of the descending aorta measuring up to 4 mm in thickness -. No evidence of mural hemorrhage, dissection or aneurysm. If the patient's symtoms are not referable, recommend outpatient vascular surgery consultation for management and follow up. --Start Lipitor, check fasting lipid profile in a.m., continue Plavix  4) CKD stage - IIIa --- Renal function appears to be close to baseline at this time,     renally adjust medications, avoid nephrotoxic agents / dehydration  / hypotension  Disposition/Need for in-Hospital Stay- patient unable to be discharged at this time due to --- acute hypoxic respiratory failure requiring oxygen mentation on IV antibiotics for pneumonia and iv steroids for COPD exacerbation  Status is: Inpatient  Remains inpatient appropriate because:acute hypoxic respiratory failure requiring oxygen mentation on IV antibiotics for pneumonia and iv steroids for COPD exacerbation   Disposition: The patient is from: Home              Anticipated d/c is to: Home              Anticipated d/c date is: 2 days              Patient currently is not medically stable to d/c. Barriers: Not Clinically Stable- -acute hypoxic respiratory failure requiring oxygen mentation on IV antibiotics for pneumonia and iv  steroids for COPD exacerbation  Code Status : Full   Family Communication:   (patient is alert, awake and coherent)   Consults  :  na  DVT Prophylaxis  :  Lovenox -  - SCDs   Lab Results  Component Value Date   PLT 146 (L) 01/23/2020    Inpatient Medications  Scheduled Meds: . amLODipine  10 mg Oral Daily  . atorvastatin  40 mg Oral Daily  . clopidogrel  75 mg Oral Daily  . enoxaparin (LOVENOX) injection  40 mg Subcutaneous Q24H  . losartan  100 mg Oral Daily   Continuous Infusions: .  sodium chloride 75 mL/hr at 01/22/20 2156  . [START ON 01/24/2020] levofloxacin (LEVAQUIN) IV    . metronidazole 500 mg (01/22/20 2154)   PRN Meds:.albuterol    Anti-infectives (From admission, onward)   Start     Dose/Rate Route Frequency Ordered Stop   01/24/20 1600  levofloxacin (LEVAQUIN) IVPB 750 mg        750 mg 100 mL/hr over 90 Minutes Intravenous Every 48 hours 01/22/20 1711     01/22/20 2200  metroNIDAZOLE (FLAGYL) IVPB 500 mg        500 mg 100 mL/hr over 60 Minutes Intravenous Every 8 hours 01/22/20 1920     01/22/20 1530  levofloxacin (LEVAQUIN) IVPB 750 mg        750 mg 100 mL/hr over 90 Minutes Intravenous  Once 01/22/20 1529 01/22/20 1724   01/22/20 1530  metroNIDAZOLE (FLAGYL) IVPB 500 mg        500 mg 100 mL/hr over 60 Minutes Intravenous  Once 01/22/20 1529 01/22/20 1724        Objective:   Vitals:   01/22/20 1921 01/22/20 2204 01/23/20 0300 01/23/20 1055  BP:  (!) 153/71 (!) 150/70   Pulse:  61 60   Resp:  18 16   Temp:  98.4 F (36.9 C) 98.6 F (37 C)   TempSrc:   Oral   SpO2: 100% 98% 99% 99%  Weight:      Height:        Wt Readings from Last 3 Encounters:  01/22/20 56.2 kg  12/02/19 56.7 kg  08/26/19 61.1 kg     Intake/Output Summary (Last 24 hours) at 01/23/2020 1404 Last data filed at 01/23/2020 0300 Gross per 24 hour  Intake 626.53 ml  Output --  Net 626.53 ml     Physical Exam  Gen:- Awake Alert, dyspnea on exertion persist  HEENT:- [place].AT, No sclera icterus Nose- [place] 4L/min Neck-Supple Neck,No JVD,.  Lungs-diminished in bases, scattered rhonchi on the right, upper lung fields wheezing  CV- S1, S2 normal, regular  Abd-  +ve B.Sounds, Abd Soft, No tenderness,    Extremity/Skin:- No  edema, pedal pulses present  Psych-affect is appropriate, oriented x3 Neuro-no new focal deficits, no tremors   Data Review:   Micro Results Recent Results (from the past 240 hour(s))  SARS Coronavirus 2 by RT PCR (hospital order, performed in  [facility] hospital lab) Nasopharyngeal Nasopharyngeal Swab     Status: None   Collection Time: 01/22/20 11:07 AM   Specimen: Nasopharyngeal Swab  Result Value Ref Range Status   SARS Coronavirus 2 NEGATIVE NEGATIVE Final    Comment: (NOTE) SARS-CoV-2 target nucleic acids are NOT DETECTED.  The SARS-CoV-2 RNA is generally detectable in upper and lower respiratory specimens during the acute phase of infection. The lowest concentration of SARS-CoV-2 viral copies this assay can detect is 250 copies /  mL. A negative result does not preclude SARS-CoV-2 infection and should not be used as the sole basis for treatment or other patient management decisions.  A negative result may occur with improper specimen collection / handling, submission of specimen other than nasopharyngeal swab, presence of viral mutation(s) within the areas targeted by this assay, and inadequate number of viral copies (<250 copies / mL). A negative result must be combined with clinical observations, patient history, and epidemiological information.  Fact Sheet for Patients:   BoilerBrush.com.cyhttps://www.fda.gov/media/136312/download  Fact Sheet for Healthcare Providers: https://pope.com/https://www.fda.gov/media/136313/download  This test is not yet approved or  cleared by the [place] FDA and has been authorized for detection and/or diagnosis of SARS-CoV-2 by FDA under an Emergency Use Authorization (EUA).  This EUA will remain in effect (meaning this test can be used) for the duration of the COVID-19 declaration under Section 564(b)(1) of the Act, 21 U.S.C. section 360bbb-3(b)(1), unless the authorization is terminated or revoked sooner.  Performed at [facility], [address] [place], [place] [address]     Radiology Reports CT Angio Chest PE W/Cm &/Or Wo Cm  Result Date: 01/22/2020 CLINICAL DATA:  PE suspected. Positive D-dimer. Increased shortness of breath. EXAM: CT ANGIOGRAPHY CHEST WITH CONTRAST TECHNIQUE: Multidetector CT  imaging of the chest was performed using the standard protocol during bolus administration of intravenous contrast. Multiplanar CT image reconstructions and MIPs were obtained to evaluate the vascular anatomy. CONTRAST:  75mL OMNIPAQUE IOHEXOL 350 MG/ML SOLN COMPARISON:  None. FINDINGS: Cardiovascular: Satisfactory opacification of the pulmonary arteries to the segmental level. No evidence of pulmonary embolism. Normal heart size. No pericardial effusion. No aortic aneurysm or dissection. Moderate atherosclerosis of the aorta. There are two penetrating atherosclerotic ulcers along the posterior aspect of the descending aorta which measures up to 4 mm (series 5, image 316 and series 5, image 318). Additional outpouching along the lateral aspect of the proximal descending aorta (series 5, image 100) is favored to represent normal variant/ductus bump. Mediastinum/Nodes: No enlarged mediastinal, hilar, or axillary lymph nodes. Thyroid gland, trachea, and esophagus demonstrate no significant findings. r Lungs/Pleura: There is moderate centrilobular emphysema, most pronounced at the apices. Mild diffuse bronchial wall thickening with areas of mucous plugging. No effusions. No pneumothorax. Upper Abdomen: Multiple low-attenuation liver lesions, incompletely characterized, but likely cysts. Normal adrenal glands. No hydronephrosis. Small hypodense renal lesions, incompletely characterized but likely cysts. Musculoskeletal: Partially imaged anterior cervicothoracic fusion. Multilevel degenerative changes of the spine. No acute osseous abnormality. Bulky anteriorly directed cervical osteophytes, partially imaged Review of the MIP images confirms the above findings. IMPRESSION: 1. No evidence of acute pulmonary embolism. 2. Moderate emphysema. Diffuse bronchial wall thickening with areas of debris in bronchioles in the right lower lobe, concerning for mild aspiration. No consolidation. 3. Two likely penetrating atherosclerotic  ulcers involving the posterior aspect of the descending aorta measuring up to 4 mm in thickness, as detailed above. No evidence of mural hemorrhage, dissection or aneurysm. If the patient's symtoms are not referable, recommend outpatient vascular surgery consultation for management and follow up. Electronically Signed   By: Feliberto HartsFrederick S Jones MD   On: 01/22/2020 15:02   MR BRAIN WO CONTRAST  Result Date: 01/22/2020 CLINICAL DATA:  Left-sided weakness EXAM: MRI HEAD WITHOUT CONTRAST TECHNIQUE: Multiplanar, multiecho pulse sequences of the brain and surrounding structures were obtained without intravenous contrast. COMPARISON:  2013 FINDINGS: Brain: There is no acute infarction or intracranial hemorrhage. There is no intracranial mass, mass effect, or edema. There is no hydrocephalus or  extra-axial fluid collection. Ventricles and sulci are within normal limits in size and configuration. Patchy and confluent areas of T2 hyperintensity in the supratentorial white matter are nonspecific but probably reflect mild to moderate chronic microvascular ischemic changes similar to the prior study. Vascular: Major vessel flow voids at the skull base are preserved. Skull and upper cervical spine: Normal marrow signal is preserved. Sinuses/Orbits: Minor mucosal thickening. Bilateral lens replacements. Other: Sella is unremarkable.  Mastoid air cells are clear. IMPRESSION: No evidence of recent infarction, hemorrhage, or mass. Chronic microvascular ischemic changes similar to 2013 study. Electronically Signed   By: Guadlupe Spanish M.D.   On: 01/22/2020 12:21   DG Chest Port 1 View  Result Date: 01/22/2020 CLINICAL DATA:  Shortness of breath EXAM: PORTABLE CHEST 1 VIEW COMPARISON:  02/11/2012 FINDINGS: The heart size and mediastinal contours are within normal limits. Aortic atherosclerosis. Both lungs are clear. No pleural effusion or pneumothorax. The visualized skeletal structures are unremarkable. Partially imaged cervical  fusion hardware. Polyarticular degenerative change. IMPRESSION: No acute cardiopulmonary disease. Electronically Signed   By: Feliberto Harts MD   On: 01/22/2020 11:30     CBC Recent Labs  Lab 01/22/20 1110 01/23/20 0700  WBC 5.5 4.8  HGB 16.0* 15.2*  HCT 50.7* 49.0*  PLT 159 146*  MCV 97.9 98.8  MCH 30.9 30.6  MCHC 31.6 31.0  RDW 13.6 13.2  LYMPHSABS 1.7  --   MONOABS 0.4  --   EOSABS 0.4  --   BASOSABS 0.0  --     Chemistries  Recent Labs  Lab 01/22/20 1110 01/23/20 0700  NA 142 140  K 3.4* 4.3  CL 106 107  CO2 26 25  GLUCOSE 104* 105*  BUN 10 14  CREATININE 1.06* 1.16*  CALCIUM 9.0 8.6*  AST 18  --   ALT 9  --   ALKPHOS 41  --   BILITOT 0.9  --    ------------------------------------------------------------------------------------------------------------------ No results for input(s): CHOL, HDL, LDLCALC, TRIG, CHOLHDL, LDLDIRECT in the last 72 hours.  Lab Results  Component Value Date   HGBA1C 5.7 (H) 02/12/2012   ------------------------------------------------------------------------------------------------------------------ No results for input(s): TSH, T4TOTAL, T3FREE, THYROIDAB in the last 72 hours.  Invalid input(s): FREET3 ------------------------------------------------------------------------------------------------------------------ No results for input(s): VITAMINB12, FOLATE, FERRITIN, TIBC, IRON, RETICCTPCT in the last 72 hours.  Coagulation profile No results for input(s): INR, PROTIME in the last 168 hours.  Recent Labs    01/22/20 1110  DDIMER 0.78*    Cardiac Enzymes No results for input(s): CKMB, TROPONINI, MYOGLOBIN in the last 168 hours.  Invalid input(s): CK ------------------------------------------------------------------------------------------------------------------ No results found for: BNP   Shon Hale M.D on 01/23/2020 at 2:04 PM  Go to [email or web address] - for contact info  [facility] - Office   [phone]

## 2020-01-24 DIAGNOSIS — I739 Peripheral vascular disease, unspecified: Secondary | ICD-10-CM | POA: Diagnosis present

## 2020-01-24 LAB — LIPID PANEL
Cholesterol: 151 mg/dL (ref 0–200)
HDL: 58 mg/dL (ref >40)
LDL Cholesterol: 87 mg/dL (ref 0–99)
Total CHOL/HDL Ratio: 2.6 RATIO
Triglycerides: 32 mg/dL (ref <150)
VLDL: 6 mg/dL (ref 0–40)

## 2020-01-24 MED ORDER — PROAIR HFA 108 (90 BASE) MCG/ACT IN AERS: 2.0000 | INHALATION_SPRAY | Freq: Four times a day (QID) | RESPIRATORY_TRACT | 3 refills | Status: DC | PRN

## 2020-01-24 MED ORDER — CLOPIDOGREL BISULFATE 75 MG PO TABS
75.0000 mg | ORAL_TABLET | Freq: Every day | ORAL | 3 refills | Status: DC
Start: 2020-01-24 — End: 2020-08-19

## 2020-01-24 MED ORDER — AMLODIPINE BESYLATE 10 MG PO TABS: 10.0000 mg | ORAL_TABLET | Freq: Every day | ORAL | 3 refills | Status: DC

## 2020-01-24 MED ORDER — LEVOFLOXACIN 750 MG PO TABS: 750.0000 mg | ORAL_TABLET | ORAL | 0 refills | Status: AC

## 2020-01-24 MED ORDER — LOSARTAN POTASSIUM 100 MG PO TABS
100.0000 mg | ORAL_TABLET | Freq: Every day | ORAL | 5 refills | Status: DC
Start: 2020-01-25 — End: 2020-08-19

## 2020-01-24 MED ORDER — ATORVASTATIN CALCIUM 40 MG PO TABS
40.0000 mg | ORAL_TABLET | Freq: Every day | ORAL | 5 refills | Status: DC
Start: 2020-01-25 — End: 2020-08-19

## 2020-01-24 MED ORDER — GUAIFENESIN ER 600 MG PO TB12: 600.0000 mg | ORAL_TABLET | Freq: Two times a day (BID) | ORAL | 0 refills | Status: AC

## 2020-01-24 NOTE — Discharge Instructions (Signed)
1)You are taking Plavix/clopidogrel which is a blood thinner so please Avoid ibuprofen/Advil/Aleve/Motrin/Goody Powders/Naproxen/BC powders/Meloxicam/Diclofenac/Indomethacin and other Nonsteroidal anti-inflammatory medications as these will make you more likely to bleed and can cause stomach ulcers, can also cause Kidney problems.   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

## 2020-01-24 NOTE — Progress Notes (Signed)
Ambulated on room air, sats maintained within 96-100% on room air, no c/o SOB or weakness. Ambulated approximately 150 ft. Will continue to monitor.

## 2020-01-24 NOTE — Discharge Summary (Signed)
[name], is a 79 y.o. female  DOB [birth year]  MRN [id number].  Admission date:  01/22/2020  Admitting Physician  Tabitha Hale, MD  Discharge Date:  01/24/2020   Primary MD  Tabitha Fudge, FNP  Recommendations for primary care physician for things to follow:   1)You are taking Plavix/clopidogrel which is a blood thinner so please Avoid ibuprofen/Advil/Aleve/Motrin/Goody Powders/Naproxen/BC powders/Meloxicam/Diclofenac/Indomethacin and other Nonsteroidal anti-inflammatory medications as these will make you more likely to bleed and can cause stomach ulcers, can also cause Kidney problems.   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] Aspiration pneumonia of right lower lobe, unspecified aspiration pneumonia type (HCC) [J69.0] Respiratory failure with hypoxia (HCC) [J96.91]   Discharge Diagnosis  Aspiration pneumonia (HCC) [J69.0] 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, 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] Aspiration pneumonia of right lower lobe, unspecified aspiration pneumonia type (HCC) [J69.0] Respiratory failure with hypoxia (HCC) [J96.91]    Principal Problem:   Aspiration pneumonia (HCC) Active Problems:   COPD with acute exacerbation (HCC)   Essential hypertension with goal blood pressure less than 130/80   History of TIA (transient ischemic attack)   Stage 3a chronic  kidney disease   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 Arthritis    OA  . GERD (gastroesophageal reflux disease)   . Headache(784.0)   . Hepatitis    history of Hepatitis 20 years ago; not sure what kind  . History of gout   . Hypertension   . Hypertensive encephalopathy   . Stroke (HCC)    SLIGHT RT SIDE WEAKNESS 2001    Past Surgical History:  Procedure Laterality Date  . ABDOMINAL HYSTERECTOMY    . ANTERIOR CERVICAL CORPECTOMY  03/07/2012   Procedure: ANTERIOR CERVICAL CORPECTOMY;  Surgeon: Tabitha Abu, MD;  Location: [facility];  Service: Neurosurgery;  Laterality: N/A;  Cervical six-seven, cervical seven-thoracic one Anterior cervical decompression/diskectomy/fusion, with Cervical seven Corpectomy, reconstruction using Allograft and Alphatec plate  . CATARACT EXTRACTION W/PHACO Left 04/12/2017   Procedure: CATARACT EXTRACTION PHACO AND INTRAOCULAR LENS PLACEMENT (IOC);  Surgeon: Tabitha Pierce, MD;  Location: [facility];  Service: Ophthalmology;  Laterality: Left;  CDE: 3.82  . CATARACT EXTRACTION W/PHACO Right 05/03/2017   Procedure: CATARACT EXTRACTION PHACO AND INTRAOCULAR  LENS PLACEMENT RIGHT EYE;  Surgeon: Tabitha Pierce, MD;  Location: [facility];  Service: Ophthalmology;  Laterality: Right;  CDE: 3.89  . MULTIPLE TOOTH EXTRACTIONS    . TONSILLECTOMY       HPI  from the history and physical done on the day of admission:   Patient Coming From: home  Chief Complaint: SOB  HPI: [name] is a 80 y.o. 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  Patient  presents with 5-day history of worsening shortness of breath and dyspnea on exertion.  Symptoms are worsening.  Dyspneic to approximately 30 feet.  Does report waking up often this week with coughing, gagging.  Denies aspiration. Had a breathing treatment here, which was helpful. No other palliating or provoking factors.   Emergency Department Course: CTA neg for PE. Does show atherosclerotic ulcers in aorta. Also shows aspiration pneumonia.  Given Flagyl and Levaquin  Review of Systems:   Pt denies any fevers, chills, nausea, vomiting, diarrhea, constipation, abdominal pain, palpitations, headache, vision changes, lightheadedness, dizziness, melena, rectal bleeding.  Review of systems are otherwise negative   Hospital Course:   Brief Summary:- 79 year old with past medical history relevant for HTN, COPD, prior stroke with residual right-sided hemiparesis and ongoing tobacco abuse admitted on 01/22/2020 with acute hypoxic respiratory failure secondary to presumed right-sided aspiration pneumonia and COPD exacerbation  A/p 1) acute hypoxic respiratory failure--- suspect due to combination of COPD exacerbation and pneumonia (?? Aspiration related )  --History of anaphylaxis to penicillin, no history of prior exposure to cephalosporins -Hypoxia resolved with treatment for pneumonia -Patient was treated with IV Levaquin and Flagyl,  as well as bronchodilators and supplemental oxygen at 3 L/min continuously -Completely weaned off oxygen at this time -Okay to discharge home on p.o. 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 No evidence of mural hemorrhage, dissection or aneurysm. If the patient's symtoms are not referable, recommend outpatient vascular surgery consultation for management and follow up. -Continue Lipitor, continue Plavix -Fasting lipid profile revealed HDL of 58 and LDL of 87 Even if her lipid panel is within desired limits, patient should still take Lipitor/ for it's Pleiotropic effects (beyond cholesterol lowering benefits)  4) CKD stage - IIIa --- Renal function appears to be close to baseline at this time,     renally adjust medications, avoid nephrotoxic agents / dehydration  / hypotension  Disposition--- Home  Code Status : Full   Family Communication:   (patient is alert, awake and coherent)   Consults  :  na  Discharge Condition: stable  Follow UP   Follow-up Information    Maeola Harman, MD. Call in 1 week(s).   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 [place] [address] [phone]               Diet and Activity recommendation:  As advised  Discharge Instructions    Discharge Instructions    Call MD for:  difficulty breathing, headache or visual disturbances   Complete by: As directed    Call MD for:  persistant dizziness or light-headedness   Complete by: As directed    Call MD for:  persistant nausea and vomiting   Complete by: As directed    Call MD for:  temperature >100.4   Complete by: As directed    Diet - low sodium heart healthy   Complete by: As directed    Discharge instructions   Complete by: As directed    1)You are taking Plavix/clopidogrel which is a blood thinner so  please Avoid ibuprofen/Advil/Aleve/Motrin/Goody Powders/Naproxen/BC powders/Meloxicam/Diclofenac/Indomethacin and other Nonsteroidal anti-inflammatory medications as these will make you more likely to bleed and can cause stomach ulcers, can also cause Kidney problems.   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 measuring up to 4 mm in thickness----recommend outpatient vascular surgery consultation for management and follow up--please take Plavix and Lipitor as prescribed   Increase activity slowly   Complete by: As directed         Discharge Medications     Allergies as of 01/24/2020      Reactions   Penicillins Anaphylaxis, Swelling, Rash, Other (See Comments)   Has patient had a PCN reaction causing immediate rash, facial/tongue/throat swelling, SOB or lightheadedness with hypotension: yes Has patient had a PCN reaction causing severe rash involving mucus membranes or skin necrosis: no Has patient had a PCN reaction that required hospitalization: yes Has patient had a PCN reaction occurring within the last 10 years: no If all of the above answers are "NO", then may proceed with Cephalosporin use.      Medication List    STOP taking these medications   losartan-hydrochlorothiazide 100-25 MG tablet Commonly known as: HYZAAR   meloxicam 15 MG tablet Commonly known as: MOBIC     TAKE these medications   amLODipine 10 MG tablet Commonly known as: NORVASC Take 1 tablet (10 mg total) by mouth daily.   atorvastatin 40 MG tablet Commonly known as: LIPITOR Take 1 tablet (40 mg total) by mouth daily. Start taking on: January 25, 2020   clopidogrel 75 MG tablet Commonly known as: PLAVIX Take 1 tablet (75 mg  total) by mouth daily.   guaiFENesin 600 MG 12 hr tablet Commonly known as: Mucinex Take 1 tablet (600 mg total) by mouth 2 (two) times daily for 10 days.   levofloxacin 750 MG tablet Commonly known as: Levaquin Take 1 tablet (750 mg total) by mouth every Monday, Wednesday, and Friday for 3 doses. Start taking on: January 25, 2020   losartan 100 MG tablet Commonly known as: COZAAR Take 1 tablet (100 mg total) by mouth daily. Start taking on: January 25, 2020   ProAir HFA 108 (90 Base) MCG/ACT inhaler Generic drug: albuterol Inhale 2 puffs into the lungs every 6 (six) hours as needed for wheezing or shortness of breath (cough). What changed: reasons to take this   sertraline 50 MG tablet Commonly known as: ZOLOFT Take 0.5 tablets (25 mg total) by mouth daily.       Major procedures and Radiology Reports - PLEASE review detailed and final reports for all details, in brief -   CT Angio Chest PE W/Cm &/  Or Wo Cm  Result Date: 01/22/2020 CLINICAL DATA:  PE suspected. Positive D-dimer. Increased shortness of breath. EXAM: CT ANGIOGRAPHY CHEST WITH CONTRAST TECHNIQUE: Multidetector CT imaging of the chest was performed using the standard protocol during bolus administration of intravenous contrast. Multiplanar CT image reconstructions and MIPs were obtained to evaluate the vascular anatomy. CONTRAST:  55mL OMNIPAQUE IOHEXOL 350 MG/ML SOLN COMPARISON:  None. FINDINGS: Cardiovascular: Satisfactory opacification of the pulmonary arteries to the segmental level. No evidence of pulmonary embolism. Normal heart size. No pericardial effusion. No aortic aneurysm or dissection. Moderate atherosclerosis of the aorta. There are two penetrating atherosclerotic ulcers along the posterior aspect of the descending aorta which measures up to 4 mm (series 5, image 316 and series 5, image 318). Additional outpouching along the lateral aspect of the proximal descending aorta (series 5, image 100) is favored to  represent normal variant/ductus bump. Mediastinum/Nodes: No enlarged mediastinal, hilar, or axillary lymph nodes. Thyroid gland, trachea, and esophagus demonstrate no significant findings. r Lungs/Pleura: There is moderate centrilobular emphysema, most pronounced at the apices. Mild diffuse bronchial wall thickening with areas of mucous plugging. No effusions. No pneumothorax. Upper Abdomen: Multiple low-attenuation liver lesions, incompletely characterized, but likely cysts. Normal adrenal glands. No hydronephrosis. Small hypodense renal lesions, incompletely characterized but likely cysts. Musculoskeletal: Partially imaged anterior cervicothoracic fusion. Multilevel degenerative changes of the spine. No acute osseous abnormality. Bulky anteriorly directed cervical osteophytes, partially imaged Review of the MIP images confirms the above findings. IMPRESSION: 1. No evidence of acute pulmonary embolism. 2. Moderate emphysema. Diffuse bronchial wall thickening with areas of debris in bronchioles in the right lower lobe, concerning for mild aspiration. No consolidation. 3. Two likely penetrating atherosclerotic ulcers involving the posterior aspect of the descending aorta measuring up to 4 mm in thickness, as detailed above. No evidence of mural hemorrhage, dissection or aneurysm. If the patient's symtoms are not referable, recommend outpatient vascular surgery consultation for management and follow up. Electronically Signed   By: Feliberto Harts MD   On: 01/22/2020 15:02   MR BRAIN WO CONTRAST  Result Date: 01/22/2020 CLINICAL DATA:  Left-sided weakness EXAM: MRI HEAD WITHOUT CONTRAST TECHNIQUE: Multiplanar, multiecho pulse sequences of the brain and surrounding structures were obtained without intravenous contrast. COMPARISON:  2013 FINDINGS: Brain: There is no acute infarction or intracranial hemorrhage. There is no intracranial mass, mass effect, or edema. There is no hydrocephalus or extra-axial fluid  collection. Ventricles and sulci are within normal limits in size and configuration. Patchy and confluent areas of T2 hyperintensity in the supratentorial white matter are nonspecific but probably reflect mild to moderate chronic microvascular ischemic changes similar to the prior study. Vascular: Major vessel flow voids at the skull base are preserved. Skull and upper cervical spine: Normal marrow signal is preserved. Sinuses/Orbits: Minor mucosal thickening. Bilateral lens replacements. Other: Sella is unremarkable.  Mastoid air cells are clear. IMPRESSION: No evidence of recent infarction, hemorrhage, or mass. Chronic microvascular ischemic changes similar to 2013 study. Electronically Signed   By: Guadlupe Spanish M.D.   On: 01/22/2020 12:21   DG Chest Port 1 View  Result Date: 01/22/2020 CLINICAL DATA:  Shortness of breath EXAM: PORTABLE CHEST 1 VIEW COMPARISON:  02/11/2012 FINDINGS: The heart size and mediastinal contours are within normal limits. Aortic atherosclerosis. Both lungs are clear. No pleural effusion or pneumothorax. The visualized skeletal structures are unremarkable. Partially imaged cervical fusion hardware. Polyarticular degenerative change. IMPRESSION: No acute cardiopulmonary disease. Electronically Signed   By: Feliberto Harts MD  On: 01/22/2020 11:30   Micro Results   Recent Results (from the past 240 hour(s))  SARS Coronavirus 2 by RT PCR (hospital order, performed in [facility] hospital lab) Nasopharyngeal Nasopharyngeal Swab     Status: None   Collection Time: 01/22/20 11:07 AM   Specimen: Nasopharyngeal Swab  Result Value Ref Range Status   SARS Coronavirus 2 NEGATIVE NEGATIVE Final    Comment: (NOTE) SARS-CoV-2 target nucleic acids are NOT DETECTED.  The SARS-CoV-2 RNA is generally detectable in upper and lower respiratory specimens during the acute phase of infection. The lowest concentration of SARS-CoV-2 viral copies this assay can detect is 250 copies / mL. A  negative result does not preclude SARS-CoV-2 infection and should not be used as the sole basis for treatment or other patient management decisions.  A negative result may occur with improper specimen collection / handling, submission of specimen other than nasopharyngeal swab, presence of viral mutation(s) within the areas targeted by this assay, and inadequate number of viral copies (<250 copies / mL). A negative result must be combined with clinical observations, patient history, and epidemiological information.  Fact Sheet for Patients:   BoilerBrush.com.cy  Fact Sheet for Healthcare Providers: https://pope.com/  This test is not yet approved or  cleared by the [place] FDA and has been authorized for detection and/or diagnosis of SARS-CoV-2 by FDA under an Emergency Use Authorization (EUA).  This EUA will remain in effect (meaning this test can be used) for the duration of the COVID-19 declaration under Section 564(b)(1) of the Act, 21 U.S.C. section 360bbb-3(b)(1), unless the authorization is terminated or revoked sooner.  Performed at [facility], [address], [place], [place] [address]    Today   Subjective    Besse Miron today has no new complaints No fever  Or chills   No Nausea, Vomiting or Diarrhea        Patient has been seen and examined prior to discharge   Objective   Blood pressure (!) 135/52, pulse (!) 55, temperature 98 F (36.7 C), temperature source Oral, resp. rate 18, height 5' 3.75" (1.619 m), weight 56.2 kg, SpO2 93 %.   Intake/Output Summary (Last 24 hours) at 01/24/2020 1032 Last data filed at 01/23/2020 1307 Gross per 24 hour  Intake 240 ml  Output --  Net 240 ml    Exam Gen:- Awake Alert, no acute distress , speaking in complete sentences HEENT:- [email or web address], No sclera icterus Neck-Supple Neck,No JVD,.  Lungs-improved air movement, no wheezing  CV- S1, S2 normal, regular Abd-  +ve  B.Sounds, Abd Soft, No tenderness,    Extremity/Skin:- No  edema,   good pulses Psych-affect is appropriate, oriented x3 Neuro-no new focal deficits, no tremors    Data Review   CBC w Diff:  Lab Results  Component Value Date   WBC 4.8 01/23/2020   HGB 15.2 (H) 01/23/2020   HGB 15.3 07/24/2019   HCT 49.0 (H) 01/23/2020   HCT 45.6 07/24/2019   PLT 146 (L) 01/23/2020   PLT 173 07/24/2019   LYMPHOPCT 31 01/22/2020   MONOPCT 8 01/22/2020   EOSPCT 8 01/22/2020   BASOPCT 1 01/22/2020    CMP:  Lab Results  Component Value Date   NA 140 01/23/2020   NA 146 (H) 07/24/2019   K 4.3 01/23/2020   CL 107 01/23/2020   CO2 25 01/23/2020   BUN 14 01/23/2020   BUN 15 07/24/2019   CREATININE 1.16 (H) 01/23/2020   PROT 7.4 01/22/2020  PROT 6.8 07/24/2019   ALBUMIN 4.1 01/22/2020   ALBUMIN 4.3 07/24/2019   BILITOT 0.9 01/22/2020   BILITOT 0.4 07/24/2019   ALKPHOS 41 01/22/2020   AST 18 01/22/2020   ALT 9 01/22/2020  .   Total Discharge time is about 33 minutes  Tabitha Haleourage Damir Leung M.D on 01/24/2020 at 10:32 AM  Go to [email or web address] -  for contact info  [facility] - Office  [phone]

## 2020-01-27 ENCOUNTER — Other Ambulatory Visit: Payer: Self-pay

## 2020-01-27 ENCOUNTER — Ambulatory Visit (INDEPENDENT_AMBULATORY_CARE_PROVIDER_SITE_OTHER): Payer: Medicare Other

## 2020-01-27 DIAGNOSIS — Z0000 Encounter for general adult medical examination without abnormal findings: Secondary | ICD-10-CM

## 2020-01-27 NOTE — Progress Notes (Signed)
MEDICARE ANNUAL WELLNESS VISIT  01/27/2020  Telephone Visit Disclaimer This Medicare AWV was conducted by telephone due to national recommendations for restrictions regarding the COVID-19 Pandemic (e.g. social distancing).  I verified, using two identifiers, that I am speaking with [name] or their authorized healthcare agent. I discussed the limitations, risks, security, and privacy concerns of performing an evaluation and management service by telephone and the potential availability of an in-person appointment in the future. The patient expressed understanding and agreed to proceed.   Subjective:  [name] is a 79 y.o. female patient of Gwenlyn Fudge, FNP who had a Medicare Annual Wellness Visit today via telephone. [name] lives locally here in [place]. She was located at her home during this telephone visit and I was located in the office at [facility]. She is a widow but has been living with a significant other for 30 years, they have never married. She had 6 children with her first husband. She is retired and worked as a Lawyer for 50 years. Her children all live in [place] and [place]. She talks to them on a regular basis. She enjoys adult coloring books, playing the lottery, and playing with her two cats and 1 dog. She recently was discharged from the hospital for aspiration pneumonia but is doing well since being home. She is still weak. Before her hospital stay she would walk her dog on a daily basis but doesn't really have an exercise routine.   Patient Care Team: Gwenlyn Fudge, FNP as PCP - General (Family Medicine)  Advanced Directives 01/27/2020 01/22/2020 01/22/2020 01/26/2019 04/12/2017 04/10/2017 02/10/2016  Does Patient Have a Medical Advance Directive? No Yes Yes No No No No  Type of Advance Directive - Healthcare Power of [employer] Power of [place];Living will - - - -  Does patient want to make changes to medical advance  directive? No - Patient declined No - Patient declined No - Guardian declined - - - -  Copy of Healthcare Power of Attorney in Chart? - No - copy requested No - copy requested - - - -  Would patient like information on creating a medical advance directive? No - Patient declined - - No - Patient declined - No - Patient declined No - patient declined information    Hospital Utilization Over the Past 12 Months: # of hospitalizations or ER visits: 1 # of surgeries: 0  Review of Systems    Patient reports that her overall health is unchanged compared to last year.  History obtained from chart review  Patient Reported Readings (BP, Pulse, CBG, Weight, etc) none  Pain Assessment Pain : No/denies pain Pain Score: 0-No pain     Current Medications & Allergies (verified) Allergies as of 01/27/2020      Reactions   Penicillins Anaphylaxis, Swelling, Rash, Other (See Comments)   Has patient had a PCN reaction causing immediate rash, facial/tongue/throat swelling, SOB or lightheadedness with hypotension: yes Has patient had a PCN reaction causing severe rash involving mucus membranes or skin necrosis: no Has patient had a PCN reaction that required hospitalization: yes Has patient had a PCN reaction occurring within the last 10 years: no If all of the above answers are "NO", then may proceed with Cephalosporin use.      Medication List       Accurate as of January 27, 2020  9:09 AM. If you have any questions, ask your nurse or doctor.  amLODipine 10 MG tablet Commonly known as: NORVASC Take 1 tablet (10 mg total) by mouth daily.   atorvastatin 40 MG tablet Commonly known as: LIPITOR Take 1 tablet (40 mg total) by mouth daily.   clopidogrel 75 MG tablet Commonly known as: PLAVIX Take 1 tablet (75 mg total) by mouth daily.   guaiFENesin 600 MG 12 hr tablet Commonly known as: Mucinex Take 1 tablet (600 mg total) by mouth 2 (two) times daily for 10 days.   levofloxacin  750 MG tablet Commonly known as: Levaquin Take 1 tablet (750 mg total) by mouth every Monday, Wednesday, and Friday for 3 doses.   losartan 100 MG tablet Commonly known as: COZAAR Take 1 tablet (100 mg total) by mouth daily.   ProAir HFA 108 (90 Base) MCG/ACT inhaler Generic drug: albuterol Inhale 2 puffs into the lungs every 6 (six) hours as needed for wheezing or shortness of breath (cough).   sertraline 50 MG tablet Commonly known as: ZOLOFT Take 0.5 tablets (25 mg total) by mouth daily.       History (reviewed): Past Medical History:  Diagnosis Date  . Arthritis    OA  . GERD (gastroesophageal reflux disease)   . Headache(784.0)   . Hepatitis    history of Hepatitis 20 years ago; not sure what kind  . History of gout   . Hypertension   . Hypertensive encephalopathy   . Stroke (HCC)    SLIGHT RT SIDE WEAKNESS 2001   Past Surgical History:  Procedure Laterality Date  . ABDOMINAL HYSTERECTOMY    . ANTERIOR CERVICAL CORPECTOMY  03/07/2012   Procedure: ANTERIOR CERVICAL CORPECTOMY;  Surgeon: Barnett Abu, MD;  Location: [facility];  Service: Neurosurgery;  Laterality: N/A;  Cervical six-seven, cervical seven-thoracic one Anterior cervical decompression/diskectomy/fusion, with Cervical seven Corpectomy, reconstruction using Allograft and Alphatec plate  . CATARACT EXTRACTION W/PHACO Left 04/12/2017   Procedure: CATARACT EXTRACTION PHACO AND INTRAOCULAR LENS PLACEMENT (IOC);  Surgeon: Fabio Pierce, MD;  Location: [facility];  Service: Ophthalmology;  Laterality: Left;  CDE: 3.82  . CATARACT EXTRACTION W/PHACO Right 05/03/2017   Procedure: CATARACT EXTRACTION PHACO AND INTRAOCULAR LENS PLACEMENT RIGHT EYE;  Surgeon: Fabio Pierce, MD;  Location: [facility];  Service: Ophthalmology;  Laterality: Right;  CDE: 3.89  . MULTIPLE TOOTH EXTRACTIONS    . TONSILLECTOMY     Family History  Adopted: Yes  Problem Relation Age of Onset  . Bipolar disorder Daughter   . Heart disease  Daughter   . Asthma Daughter   . Bipolar disorder Son   . Bipolar disorder Daughter   . Bipolar disorder Daughter   . Bipolar disorder Daughter   . Hypertension Daughter   . Heart disease Daughter   . Drug abuse Daughter        OD   Social History   Socioeconomic History  . Marital status: Widowed    Spouse name: Not on file  . Number of children: 5  . Years of education: Not on file  . Highest education level: Not on file  Occupational History  . Occupation: retired    Comment: CNA  Tobacco Use  . Smoking status: Current Some Day Smoker    Packs/day: 0.50    Years: 50.00    Pack years: 25.00    Types: Cigarettes  . Smokeless tobacco: Never Used  Vaping Use  . Vaping Use: Never used  Substance and Sexual Activity  . Alcohol use: No  . Drug use: No  . Sexual  activity: Not Currently    Birth control/protection: Surgical  Other Topics Concern  . Not on file  Social History Narrative  . Not on file   Social Determinants of Health   Financial Resource Strain:   . Difficulty of Paying Living Expenses: Not on file  Food Insecurity:   . Worried About Programme researcher, broadcasting/film/videounning Out of Food in the Last Year: Not on file  . Ran Out of Food in the Last Year: Not on file  Transportation Needs:   . Lack of Transportation (Medical): Not on file  . Lack of Transportation (Non-Medical): Not on file  Physical Activity:   . Days of Exercise per Week: Not on file  . Minutes of Exercise per Session: Not on file  Stress:   . Feeling of Stress : Not on file  Social Connections:   . Frequency of Communication with Friends and Family: Not on file  . Frequency of Social Gatherings with Friends and Family: Not on file  . Attends Religious Services: Not on file  . Active Member of Clubs or Organizations: Not on file  . Attends BankerClub or Organization Meetings: Not on file  . Marital Status: Not on file    Activities of Daily Living In your present state of health, do you have any difficulty  performing the following activities: 01/27/2020 01/22/2020  Hearing? N N  Vision? Y N  Comment Wears glasses al the time -  Difficulty concentrating or making decisions? N N  Walking or climbing stairs? N N  Dressing or bathing? N N  Doing errands, shopping? N N  Preparing Food and eating ? N -  Using the Toilet? N -  In the past six months, have you accidently leaked urine? N -  Do you have problems with loss of bowel control? N -  Managing your Medications? N -  Managing your Finances? N -  Housekeeping or managing your Housekeeping? N -  Some recent data might be hidden    Patient Education/ Literacy How often do you need to have someone help you when you read instructions, pamphlets, or other written materials from your doctor or pharmacy?: 1 - Never What is the last grade level you completed in school?: 12th grade  Exercise Current Exercise Habits: The patient does not participate in regular exercise at present, Exercise limited by: None identified  Diet Patient reports consuming 3 meals a day and 2 snack(s) a day Patient reports that her primary diet is: Regular Patient reports that she does have regular access to food.   Depression Screen PHQ 2/9 Scores 12/02/2019 08/26/2019 07/24/2019 04/21/2019 01/26/2019 01/19/2019 01/01/2019  PHQ - 2 Score 0 0 2 1 0 0 0  PHQ- 9 Score - 6 13 8  - - -     Fall Risk Fall Risk  01/27/2020 12/02/2019 08/26/2019 07/24/2019 04/21/2019  Falls in the past year? 1 0 0 0 0  Number falls in past yr: 0 - - - -  Injury with Fall? 0 - - - -     Objective:  [name] seemed alert and oriented and she participated appropriately during our telephone visit.  Blood Pressure Weight BMI  BP Readings from Last 3 Encounters:  01/24/20 (!) 135/52  12/02/19 129/74  08/26/19 136/87   Wt Readings from Last 3 Encounters:  01/22/20 124 lb (56.2 kg)  12/02/19 125 lb (56.7 kg)  08/26/19 134 lb 12.8 oz (61.1 kg)   BMI Readings from Last 1 Encounters:  01/22/20  21.45  kg/m    *Unable to obtain current vital signs, weight, and BMI due to telephone visit type  Hearing/Vision  . [name] did not seem to have difficulty with hearing/understanding during the telephone conversation . Reports that she has had a formal eye exam by an eye care professional within the past year . Reports that she has not had a formal hearing evaluation within the past year *Unable to fully assess hearing and vision during telephone visit type  Cognitive Function: 6CIT Screen 01/27/2020 01/26/2019  What Year? 0 points 0 points  What month? 0 points 0 points  What time? 0 points 0 points  Count back from 20 0 points 0 points  Months in reverse 0 points 2 points  Repeat phrase 0 points 0 points  Total Score 0 2   (Normal:0-7, Significant for Dysfunction: >8)  Normal Cognitive Function Screening: Yes   Immunization & Health Maintenance Record Immunization History  Administered Date(s) Administered  . Influenza Split 02/13/2012  . Influenza, High Dose Seasonal PF 02/19/2019  . Influenza-Unspecified 03/28/2013  . Moderna SARS-COVID-2 Vaccination 07/23/2019, 08/21/2019  . Pneumococcal Conjugate-13 11/24/2014  . Pneumococcal Polysaccharide-23 07/09/2016  . Pneumococcal-Unspecified 07/09/2016  . Tdap 11/24/2014    Health Maintenance  Topic Date Due  . Hepatitis C Screening  Never done  . INFLUENZA VACCINE  12/27/2019  . TETANUS/TDAP  11/23/2024  . DEXA SCAN  Completed  . COVID-19 Vaccine  Completed  . PNA vac Low Risk Adult  Completed       Assessment  This is a routine wellness examination for [name].  Health Maintenance: Due or Overdue Health Maintenance Due  Topic Date Due  . Hepatitis C Screening  Never done  . INFLUENZA VACCINE  12/27/2019    [name] does not need a referral for Community Assistance: Care Management:   no Social Work:    no Prescription Assistance:  no Nutrition/Diabetes Education:  no   Plan:  Personalized  Goals Goals Addressed            This Visit's Progress   . DIET - EAT MORE FRUITS AND VEGETABLES      . Exercise 150 min/wk Moderate Activity        Personalized Health Maintenance & Screening Recommendations  Hepatitis C Screening and flu shot  Lung Cancer Screening Recommended: no (Low Dose CT Chest recommended if Age 52-80 years, 30 pack-year currently smoking OR have quit w/in past 15 years) Hepatitis C Screening recommended: yes HIV Screening recommended: no  Advanced Directives: Written information was not prepared per patient's request.  Referrals & Orders No orders of the defined types were placed in this encounter.   Follow-up Plan . Follow-up with Gwenlyn Fudge, FNP as planned . Schedule for flu shot and Hepatitis C screening   I have personally reviewed and noted the following in the patient's chart:   . Medical and social history . Use of alcohol, tobacco or illicit drugs  . Current medications and supplements . Functional ability and status . Nutritional status . Physical activity . Advanced directives . List of other physicians . Hospitalizations, surgeries, and ER visits in previous 12 months . Vitals . Screenings to include cognitive, depression, and falls . Referrals and appointments  In addition, I have reviewed and discussed with [name] certain preventive protocols, quality metrics, and best practice recommendations. A written personalized care plan for preventive services as well as general preventive health recommendations is available and can be mailed to  the patient at her request.      Cleda Daub LPN [id number]/04/4494

## 2020-01-27 NOTE — Patient Instructions (Signed)
  [name] , Thank you for taking time to come for your Medicare Wellness Visit. I appreciate your ongoing commitment to your health goals. Please review the following plan we discussed and let me know if I can assist you in the future.   These are the goals we discussed: Goals    . DIET - EAT MORE FRUITS AND VEGETABLES    . Exercise 150 min/wk Moderate Activity    . Prevent falls     Stay active         This is a list of the screening recommended for you and due dates:  Health Maintenance  Topic Date Due  .  Hepatitis C: One time screening is recommended by Center for Disease Control  (CDC) for  adults born from [birth year] through 1963.   Never done  . Flu Shot  12/27/2019  . Tetanus Vaccine  11/23/2024  . DEXA scan (bone density measurement)  Completed  . COVID-19 Vaccine  Completed  . Pneumonia vaccines  Completed

## 2020-02-04 ENCOUNTER — Other Ambulatory Visit: Payer: Self-pay

## 2020-02-04 ENCOUNTER — Ambulatory Visit (INDEPENDENT_AMBULATORY_CARE_PROVIDER_SITE_OTHER): Payer: Medicare Other | Admitting: Nurse Practitioner

## 2020-02-04 ENCOUNTER — Encounter: Payer: Self-pay | Admitting: Nurse Practitioner

## 2020-02-04 VITALS — BP 111/66 | HR 75 | Temp 98.1°F | Resp 20 | Ht 63.75 in | Wt 128.0 lb

## 2020-02-04 DIAGNOSIS — Z09 Encounter for follow-up examination after completed treatment for conditions other than malignant neoplasm: Secondary | ICD-10-CM | POA: Diagnosis not present

## 2020-02-04 DIAGNOSIS — Z720 Tobacco use: Secondary | ICD-10-CM

## 2020-02-04 DIAGNOSIS — F339 Major depressive disorder, recurrent, unspecified: Secondary | ICD-10-CM

## 2020-02-04 DIAGNOSIS — J690 Pneumonitis due to inhalation of food and vomit: Secondary | ICD-10-CM | POA: Diagnosis not present

## 2020-02-04 MED ORDER — ESCITALOPRAM OXALATE 5 MG PO TABS: 5.0000 mg | ORAL_TABLET | Freq: Every day | ORAL | 0 refills | Status: DC

## 2020-02-04 MED ORDER — BUDESONIDE-FORMOTEROL FUMARATE 160-4.5 MCG/ACT IN AERO: 2.0000 | INHALATION_SPRAY | Freq: Two times a day (BID) | RESPIRATORY_TRACT | 3 refills | Status: DC

## 2020-02-04 NOTE — Patient Instructions (Addendum)
Living With Depression Everyone experiences occasional disappointment, sadness, and loss in their lives. When you are feeling down, blue, or sad for at least 2 weeks in a row, it may mean that you have depression. Depression can affect your thoughts and feelings, relationships, daily activities, and physical health. It is caused by changes in the way your brain functions. If you receive a diagnosis of depression, your health care provider will tell you which type of depression you have and what treatment options are available to you. If you are living with depression, there are ways to help you recover from it and also ways to prevent it from coming back. How to cope with lifestyle changes Coping with stress     Stress is your body's reaction to life changes and events, both good and bad. Stressful situations may include:  Getting married.  The death of a spouse.  Losing a job.  Retiring.  Having a baby. Stress can last just a few hours or it can be ongoing. Stress can play a major role in depression, so it is important to learn both how to cope with stress and how to think about it differently. Talk with your health care provider or a counselor if you would like to learn more about stress reduction. He or she may suggest some stress reduction techniques, such as:  Music therapy. This can include creating music or listening to music. Choose music that you enjoy and that inspires you.  Mindfulness-based meditation. This kind of meditation can be done while sitting or walking. It involves being aware of your normal breaths, rather than trying to control your breathing.  Centering prayer. This is a kind of meditation that involves focusing on a spiritual word or phrase. Choose a word, phrase, or sacred image that is meaningful to you and that brings you peace.  Deep breathing. To do this, expand your stomach and inhale slowly through your nose. Hold your breath for 3-5 seconds, then exhale  slowly, allowing your stomach muscles to relax.  Muscle relaxation. This involves intentionally tensing muscles then relaxing them. Choose a stress reduction technique that fits your lifestyle and personality. Stress reduction techniques take time and practice to develop. Set aside 5-15 minutes a day to do them. Therapists can offer training in these techniques. The training may be covered by some insurance plans. Other things you can do to manage stress include:  Keeping a stress diary. This can help you learn what triggers your stress and ways to control your response.  Understanding what your limits are and saying no to requests or events that lead to a schedule that is too full.  Thinking about how you respond to certain situations. You may not be able to control everything, but you can control how you react.  Adding humor to your life by watching funny films or TV shows.  Making time for activities that help you relax and not feeling guilty about spending your time this way.  Medicines Your health care provider may suggest certain medicines if he or she feels that they will help improve your condition. Avoid using alcohol and other substances that may prevent your medicines from working properly (may interact). It is also important to:  Talk with your pharmacist or health care provider about all the medicines that you take, their possible side effects, and what medicines are safe to take together.  Make it your goal to take part in all treatment decisions (shared decision-making). This includes giving input on   the side effects of medicines. It is best if shared decision-making with your health care provider is part of your total treatment plan. If your health care provider prescribes a medicine, you may not notice the full benefits of it for 4-8 weeks. Most people who are treated for depression need to be on medicine for at least 6-12 months after they feel better. If you are taking  medicines as part of your treatment, do not stop taking medicines without first talking to your health care provider. You may need to have the medicine slowly decreased (tapered) over time to decrease the risk of harmful side effects. Relationships Your health care provider may suggest family therapy along with individual therapy and drug therapy. While there may not be family problems that are causing you to feel depressed, it is still important to make sure your family learns as much as they can about your mental health. Having your family's support can help make your treatment successful. How to recognize changes in your condition Everyone has a different response to treatment for depression. Recovery from major depression happens when you have not had signs of major depression for two months. This may mean that you will start to:  Have more interest in doing activities.  Feel less hopeless than you did 2 months ago.  Have more energy.  Overeat less often, or have better or improving appetite.  Have better concentration. Your health care provider will work with you to decide the next steps in your recovery. It is also important to recognize when your condition is getting worse. Watch for these signs:  Having fatigue or low energy.  Eating too much or too little.  Sleeping too much or too little.  Feeling restless, agitated, or hopeless.  Having trouble concentrating or making decisions.  Having unexplained physical complaints.  Feeling irritable, angry, or aggressive. Get help as soon as you or your family members notice these symptoms coming back. How to get support and help from others How to talk with friends and family members about your condition  Talking to friends and family members about your condition can provide you with one way to get support and guidance. Reach out to trusted friends or family members, explain your symptoms to them, and let them know that you are  working with a health care provider to treat your depression. Financial resources Not all insurance plans cover mental health care, so it is important to check with your insurance carrier. If paying for co-pays or counseling services is a problem, search for a local or county mental health care center. They may be able to offer public mental health care services at low or no cost when you are not able to see a private health care provider. If you are taking medicine for depression, you may be able to get the generic form, which may be less expensive. Some makers of prescription medicines also offer help to patients who cannot afford the medicines they need. Follow these instructions at home:   Get the right amount and quality of sleep.  Cut down on using caffeine, tobacco, alcohol, and other potentially harmful substances.  Try to exercise, such as walking or lifting small weights.  Take over-the-counter and prescription medicines only as told by your health care provider.  Eat a healthy diet that includes plenty of vegetables, fruits, whole grains, low-fat dairy products, and lean protein. Do not eat a lot of foods that are high in solid fats, added sugars, or salt.    Keep all follow-up visits as told by your health care provider. This is important. Contact a health care provider if:  You stop taking your antidepressant medicines, and you have any of these symptoms: ? Nausea. ? Headache. ? Feeling lightheaded. ? Chills and body aches. ? Not being able to sleep (insomnia).  You or your friends and family think your depression is getting worse. Get help right away if:  You have thoughts of hurting yourself or others. If you ever feel like you may hurt yourself or others, or have thoughts about taking your own life, get help right away. You can go to your nearest emergency department or call:  Your local emergency services (911 in the [place]  A suicide crisis helpline, such as the  National Suicide Prevention Lifeline at 417-208-8492. This is open 24-hours a day. Summary  If you are living with depression, there are ways to help you recover from it and also ways to prevent it from coming back.  Work with your health care team to create a management plan that includes counseling, stress management techniques, and healthy lifestyle habits. This information is not intended to replace advice given to you by your health care provider. Make sure you discuss any questions you have with your health care provider. Document Revised: 09/05/2018 Document Reviewed: 04/16/2016 Elsevier Patient Education  2020 [employer]  Community-Acquired Pneumonia, Adult Pneumonia is an infection of the lungs. It causes swelling in the airways of the lungs. Mucus and fluid may also build up inside the airways. One type of pneumonia can happen while a person is in a hospital. A different type can happen when a person is not in a hospital (community-acquired pneumonia).  What are the causes?  This condition is caused by germs (viruses, bacteria, or fungi). Some types of germs can be passed from one person to another. This can happen when you breathe in droplets from the cough or sneeze of an infected person. What increases the risk? You are more likely to develop this condition if you:  Have a long-term (chronic) disease, such as: ? Chronic obstructive pulmonary disease (COPD). ? Asthma. ? Cystic fibrosis. ? Congestive heart failure. ? Diabetes. ? Kidney disease.  Have HIV.  Have sickle cell disease.  Have had your spleen removed.  Do not take good care of your teeth and mouth (poor dental hygiene).  Have a medical condition that increases the risk of breathing in droplets from your own mouth and nose.  Have a weakened body defense system (immune system).  Are a smoker.  Travel to areas where the germs that cause this illness are common.  Are around certain animals or the  places they live. What are the signs or symptoms?  A dry cough.  A wet (productive) cough.  Fever.  Sweating.  Chest pain. This often happens when breathing deeply or coughing.  Fast breathing or trouble breathing.  Shortness of breath.  Shaking chills.  Feeling tired (fatigue).  Muscle aches. How is this treated? Treatment for this condition depends on many things. Most adults can be treated at home. In some cases, treatment must happen in a hospital. Treatment may include:  Medicines given by mouth or through an IV tube.  Being given extra oxygen.  Respiratory therapy. In rare cases, treatment for very bad pneumonia may include:  Using a machine to help you breathe.  Having a procedure to remove fluid from around your lungs. Follow these instructions at home: Medicines  Take over-the-counter and  prescription medicines only as told by your doctor. ? Only take cough medicine if you are losing sleep.  If you were prescribed an antibiotic medicine, take it as told by your doctor. Do not stop taking the antibiotic even if you start to feel better. General instructions   Sleep with your head and neck raised (elevated). You can do this by sleeping in a recliner or by putting a few pillows under your head.  Rest as needed. Get at least 8 hours of sleep each night.  Drink enough water to keep your pee (urine) pale yellow.  Eat a healthy diet that includes plenty of vegetables, fruits, whole grains, low-fat dairy products, and lean protein.  Do not use any products that contain nicotine or tobacco. These include cigarettes, e-cigarettes, and chewing tobacco. If you need help quitting, ask your doctor.  Keep all follow-up visits as told by your doctor. This is important. How is this prevented? A shot (vaccine) can help prevent pneumonia. Shots are often suggested for:  People older than 77 years of age.  People older than 77 years of age who: ? Are having cancer  treatment. ? Have long-term (chronic) lung disease. ? Have problems with their body's defense system. You may also prevent pneumonia if you take these actions:  Get the flu (influenza) shot every year.  Go to the dentist as often as told.  Wash your hands often. If you cannot use soap and water, use hand sanitizer. Contact a doctor if:  You have a fever.  You lose sleep because your cough medicine does not help. Get help right away if:  You are short of breath and it gets worse.  You have more chest pain.  Your sickness gets worse. This is very serious if: ? You are an older adult. ? Your body's defense system is weak.  You cough up blood. Summary  Pneumonia is an infection of the lungs.  Most adults can be treated at home. Some will need treatment in a hospital.  Drink enough water to keep your pee pale yellow.  Get at least 8 hours of sleep each night. This information is not intended to replace advice given to you by your health care provider. Make sure you discuss any questions you have with your health care provider. Document Revised: 09/03/2018 Document Reviewed: 01/09/2018 Elsevier Patient Education  2020 [employer].

## 2020-02-04 NOTE — Assessment & Plan Note (Signed)
Concerning patient tobacco abuse not well managed.  Patient not interested in quitting smoking today.  Patient is afraid that quitting cigarette will cause her to gain weight.  Advised patient on smoking cessation.  We will continue to assess.

## 2020-02-04 NOTE — Assessment & Plan Note (Signed)
Patient is a 79 year old female who presents for follow-up of depression.  She complains of increased depressed mood and fatigue.  Onset was several years ago.  Unchanged since onset.  Patient denies current suicidal and homicidal plan or intent.  Patient reports medication that was given to her in the past made her more aggressive towards others.  Patient did not take medication as prescribed and is not currently taking medication.  GAD-7 and PHQ-9 completed.  Results noted in chart.  Provided education with teach back.  Advised patient not to discontinue medication unless PCP notified. Started patient on 5 mg Lexapro.  Follow-up 4 to 6 weeks.  Rx sent to pharmacy.

## 2020-02-04 NOTE — Progress Notes (Signed)
Established Patient Office Visit  Subjective:  Patient ID: [name], female    DOB: 1940-07-13  Age: 79 y.o. MRN: [id number]  CC:  Chief Complaint  Patient presents with  . Hospitalization Follow-up    [name]- pneumonia 01/22/20 to 01/24/20    HPI [name] presents for hospital follow-up.  Patient was admitted for pneumonia preceding a 5-day history of worsening shortness of breath and dyspnea with exertion.  CTA shows negative for PE, but shows atherosclerotic ulcers in the area daughter and aspiration pneumonia.  Patient is recommended outpatient vascular surgery consultation for management and follow-up, patient started on Lipitor and continue Plavix.  Patient continues to cough even while in clinic today, patient is still reporting slight shortness of breath denies fever chills, nausea or vomiting.  Discharge instructions completed.   Depression: Patient complains of depression. She complains of depressed mood and fatigue. Onset was approximately several year ago, unchanged since that time.  She denies current suicidal and homicidal plan or intent.   Family history significant for no psychiatric illness.Possible organic causes contributing are: none.  Risk factors: none Previous treatment includes Zoloft and . She complains of the following side effects from the treatment: Aggression.  Past Medical History:  Diagnosis Date  . Arthritis    OA  . GERD (gastroesophageal reflux disease)   . Headache(784.0)   . Hepatitis    history of Hepatitis 20 years ago; not sure what kind  . History of gout   . Hypertension   . Hypertensive encephalopathy   . Stroke (HCC)    SLIGHT RT SIDE WEAKNESS 2001    Past Surgical History:  Procedure Laterality Date  . ABDOMINAL HYSTERECTOMY    . ANTERIOR CERVICAL CORPECTOMY  03/07/2012   Procedure: ANTERIOR CERVICAL CORPECTOMY;  Surgeon: Barnett Abu, MD;  Location: [facility];  Service: Neurosurgery;  Laterality: N/A;  Cervical  six-seven, cervical seven-thoracic one Anterior cervical decompression/diskectomy/fusion, with Cervical seven Corpectomy, reconstruction using Allograft and Alphatec plate  . CATARACT EXTRACTION W/PHACO Left 04/12/2017   Procedure: CATARACT EXTRACTION PHACO AND INTRAOCULAR LENS PLACEMENT (IOC);  Surgeon: Fabio Pierce, MD;  Location: [facility];  Service: Ophthalmology;  Laterality: Left;  CDE: 3.82  . CATARACT EXTRACTION W/PHACO Right 05/03/2017   Procedure: CATARACT EXTRACTION PHACO AND INTRAOCULAR LENS PLACEMENT RIGHT EYE;  Surgeon: Fabio Pierce, MD;  Location: [facility];  Service: Ophthalmology;  Laterality: Right;  CDE: 3.89  . MULTIPLE TOOTH EXTRACTIONS    . TONSILLECTOMY      Family History  Adopted: Yes  Problem Relation Age of Onset  . Bipolar disorder Daughter   . Heart disease Daughter   . Asthma Daughter   . Bipolar disorder Son   . Bipolar disorder Daughter   . Bipolar disorder Daughter   . Bipolar disorder Daughter   . Hypertension Daughter   . Heart disease Daughter   . Drug abuse Daughter        OD    Social History   Socioeconomic History  . Marital status: Widowed    Spouse name: Not on file  . Number of children: 5  . Years of education: Not on file  . Highest education level: Not on file  Occupational History  . Occupation: retired    Comment: CNA  Tobacco Use  . Smoking status: Current Some Day Smoker    Packs/day: 0.50    Years: 50.00    Pack years: 25.00    Types: Cigarettes  . Smokeless tobacco: Never  Used  Vaping Use  . Vaping Use: Never used  Substance and Sexual Activity  . Alcohol use: No  . Drug use: No  . Sexual activity: Not Currently    Birth control/protection: Surgical  Other Topics Concern  . Not on file  Social History Narrative  . Not on file   Social Determinants of Health                                                                         Outpatient Medications Prior to Visit  Medication Sig  Dispense Refill  . amLODipine (NORVASC) 10 MG tablet Take 1 tablet (10 mg total) by mouth daily. 90 tablet 3  . atorvastatin (LIPITOR) 40 MG tablet Take 1 tablet (40 mg total) by mouth daily. 30 tablet 5  . clopidogrel (PLAVIX) 75 MG tablet Take 1 tablet (75 mg total) by mouth daily. 90 tablet 3  . losartan (COZAAR) 100 MG tablet Take 1 tablet (100 mg total) by mouth daily. 30 tablet 5  . PROAIR HFA 108 (90 Base) MCG/ACT inhaler Inhale 2 puffs into the lungs every 6 (six) hours as needed for wheezing or shortness of breath (cough). 18 g 3  . budesonide-formoterol (SYMBICORT) 160-4.5 MCG/ACT inhaler Inhale 2 puffs into the lungs 2 (two) times daily.    . sertraline (ZOLOFT) 50 MG tablet Take 0.5 tablets (25 mg total) by mouth daily. 30 tablet 3   No facility-administered medications prior to visit.    Allergies  Allergen Reactions  . Penicillins Anaphylaxis, Swelling, Rash and Other (See Comments)    Has patient had a PCN reaction causing immediate rash, facial/tongue/throat swelling, SOB or lightheadedness with hypotension: yes Has patient had a PCN reaction causing severe rash involving mucus membranes or skin necrosis: no Has patient had a PCN reaction that required hospitalization: yes Has patient had a PCN reaction occurring within the last 10 years: no If all of the above answers are "NO", then may proceed with Cephalosporin use.     ROS Review of Systems  Respiratory: Positive for cough and shortness of breath. Negative for chest tightness.   All other systems reviewed and are negative.     Objective:    Physical Exam Vitals reviewed.  Constitutional:      Appearance: Normal appearance.  HENT:     Head: Normocephalic.     Nose: Nose normal.  Eyes:     Conjunctiva/sclera: Conjunctivae normal.  Cardiovascular:     Rate and Rhythm: Normal rate and regular rhythm.     Pulses: Normal pulses.     Heart sounds: Normal heart sounds.  Pulmonary:     Effort: Pulmonary effort  is normal.     Comments: Cough Abdominal:     General: Bowel sounds are normal.  Musculoskeletal:        General: No tenderness.  Skin:    General: Skin is dry.  Neurological:     Mental Status: She is alert and oriented to person, place, and time.     BP 111/66   Pulse 75   Temp 98.1 F (36.7 C)   Resp 20   Ht 5' 3.75" (1.619 m)   Wt 128 lb (58.1 kg)   SpO2 95% Comment:  RM air  BMI 22.14 kg/m  Wt Readings from Last 3 Encounters:  02/04/20 128 lb (58.1 kg)  01/22/20 124 lb (56.2 kg)  12/02/19 125 lb (56.7 kg)     Health Maintenance Due  Topic Date Due  . Hepatitis C Screening  Never done  . INFLUENZA VACCINE  12/27/2019    There are no preventive care reminders to display for this patient.  Lab Results  Component Value Date   TSH 2.830 04/21/2019   Lab Results  Component Value Date   WBC 4.8 01/23/2020   HGB 15.2 (H) 01/23/2020   HCT 49.0 (H) 01/23/2020   MCV 98.8 01/23/2020   PLT 146 (L) 01/23/2020   Lab Results  Component Value Date   NA 140 01/23/2020   K 4.3 01/23/2020   CO2 25 01/23/2020   GLUCOSE 105 (H) 01/23/2020   BUN 14 01/23/2020   CREATININE 1.16 (H) 01/23/2020   BILITOT 0.9 01/22/2020   ALKPHOS 41 01/22/2020   AST 18 01/22/2020   ALT 9 01/22/2020   PROT 7.4 01/22/2020   ALBUMIN 4.1 01/22/2020   CALCIUM 8.6 (L) 01/23/2020   ANIONGAP 8 01/23/2020   Lab Results  Component Value Date   CHOL 151 01/24/2020   Lab Results  Component Value Date   HDL 58 01/24/2020   Lab Results  Component Value Date   LDLCALC 87 01/24/2020   Lab Results  Component Value Date   TRIG 32 01/24/2020   Lab Results  Component Value Date   CHOLHDL 2.6 01/24/2020   Lab Results  Component Value Date   HGBA1C 5.7 (H) 02/12/2012      Assessment & Plan:   Problem List Items Addressed This Visit      Other   Depression, recurrent (HCC)    Patient is a 79 year old female who presents for follow-up of depression.  She complains of increased  depressed mood and fatigue.  Onset was several years ago.  Unchanged since onset.  Patient denies current suicidal and homicidal plan or intent.  Patient reports medication that was given to her in the past made her more aggressive towards others.  Patient did not take medication as prescribed and is not currently taking medication.  GAD-7 and PHQ-9 completed.  Results noted in chart.  Provided education with teach back.  Advised patient not to discontinue medication unless PCP notified. Started patient on 5 mg Lexapro.  Follow-up 4 to 6 weeks.  Rx sent to pharmacy.        Relevant Medications   escitalopram (LEXAPRO) 5 MG tablet   Tobacco abuse    Concerning patient tobacco abuse not well managed.  Patient not interested in quitting smoking today.  Patient is afraid that quitting cigarette will cause her to gain weight.  Advised patient on smoking cessation.  We will continue to assess.        Hospital discharge follow-up - Primary    Courtney ParisMorrice V Sparano is a 79 year old female presents for hospital discharge follow-up.  Patient was admitted for pneumonia preceding a 5-day history of worsening shortness of breath and dyspnea on exertion.  CTA shows negative for PE, but shows atherosclerotic ulcers in the area daughter and aspiration pneumonia.  Patient is recommended outpatient vascular surgery consultation for management and follow-up, patient started on Lipitor and continue Plavix.  Patient continues to cough even while in clinic today, patient is still reporting slight shortness of breath denies fever chills, nausea or vomiting.  Completed discharge instructions, referral to cardiology  completed, follow-up chest x-ray to rule out pneumonia in 6 to 8 weeks.        Relevant Orders   Ambulatory referral to Cardiology      Meds ordered this encounter  Medications  . escitalopram (LEXAPRO) 5 MG tablet    Sig: Take 1 tablet (5 mg total) by mouth daily.    Dispense:  60 tablet     Refill:  0    Order Specific Question:   Supervising Provider    Answer:   Arville Care A [id number]  . budesonide-formoterol (SYMBICORT) 160-4.5 MCG/ACT inhaler    Sig: Inhale 2 puffs into the lungs 2 (two) times daily.    Dispense:  1 each    Refill:  3    Order Specific Question:   Supervising Provider    Answer:   Arville Care A [[id number]]    Follow-up: Return in about 6 weeks (around 03/17/2020) for new medication (lexapro).    Daryll Drown, NP

## 2020-02-04 NOTE — Assessment & Plan Note (Signed)
[name] is a 79 year old female presents for hospital discharge follow-up.  Patient was admitted for pneumonia preceding a 5-day history of worsening shortness of breath and dyspnea on exertion.  CTA shows negative for PE, but shows atherosclerotic ulcers in the area daughter and aspiration pneumonia.  Patient is recommended outpatient vascular surgery consultation for management and follow-up, patient started on Lipitor and continue Plavix.  Patient continues to cough even while in clinic today, patient is still reporting slight shortness of breath denies fever chills, nausea or vomiting.  Completed discharge instructions, referral to cardiology completed, follow-up chest x-ray to rule out pneumonia in 6 to 8 weeks.

## 2020-02-12 ENCOUNTER — Telehealth: Payer: Self-pay | Admitting: Family Medicine

## 2020-02-17 ENCOUNTER — Telehealth: Payer: Self-pay | Admitting: *Deleted

## 2020-02-17 NOTE — Telephone Encounter (Signed)
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 Appointment will be canceled.

## 2020-02-18 ENCOUNTER — Ambulatory Visit: Payer: Medicare Other | Admitting: Cardiology

## 2020-02-18 ENCOUNTER — Other Ambulatory Visit: Payer: Self-pay | Admitting: Nurse Practitioner

## 2020-02-18 DIAGNOSIS — I700 Atherosclerosis of aorta: Secondary | ICD-10-CM

## 2020-03-08 ENCOUNTER — Other Ambulatory Visit: Payer: Self-pay

## 2020-03-08 ENCOUNTER — Ambulatory Visit (INDEPENDENT_AMBULATORY_CARE_PROVIDER_SITE_OTHER): Payer: Medicare Other | Admitting: Vascular Surgery

## 2020-03-08 ENCOUNTER — Encounter: Payer: Self-pay | Admitting: Vascular Surgery

## 2020-03-08 VITALS — BP 131/86 | HR 67 | Temp 97.4°F | Resp 16 | Ht 61.0 in | Wt 123.0 lb

## 2020-03-08 DIAGNOSIS — I739 Peripheral vascular disease, unspecified: Secondary | ICD-10-CM | POA: Diagnosis not present

## 2020-03-08 NOTE — Progress Notes (Signed)
Patient name: [name] MRN: [id number] DOB: 06-19-1940 Sex: female  REASON FOR CONSULT: Evaluate multiple PAU's  HPI: [name] is a 79 y.o. 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  Patient was recently hospitalized for aspiration event.  As part of that she underwent a CT chest to rule out PE.  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  Patient denies any prior knowledge of this.  No abdominal or back pain.  She still smoking about a pack a day and smokes since she has been 19.  Past Medical History:  Diagnosis Date  . Arthritis    OA  . GERD (gastroesophageal reflux disease)   . Headache(784.0)   . Hepatitis    history of Hepatitis 20 years ago; not sure what kind  . History of gout   . Hypertension   . Hypertensive encephalopathy   . Stroke (HCC)    SLIGHT RT SIDE WEAKNESS 2001    Past Surgical History:  Procedure Laterality Date  . ABDOMINAL HYSTERECTOMY    . ANTERIOR CERVICAL CORPECTOMY  03/07/2012   Procedure: ANTERIOR CERVICAL CORPECTOMY;  Surgeon: Barnett Abu, MD;  Location: [facility];  Service: Neurosurgery;  Laterality: N/A;  Cervical six-seven, cervical seven-thoracic one Anterior cervical decompression/diskectomy/fusion, with Cervical seven Corpectomy, reconstruction using Allograft and Alphatec plate  . CATARACT EXTRACTION W/PHACO Left 04/12/2017   Procedure: CATARACT EXTRACTION PHACO AND INTRAOCULAR LENS PLACEMENT (IOC);  Surgeon: Fabio Pierce, MD;  Location: [facility];  Service: Ophthalmology;  Laterality: Left;  CDE: 3.82  . CATARACT EXTRACTION W/PHACO Right 05/03/2017   Procedure: CATARACT EXTRACTION PHACO AND INTRAOCULAR LENS PLACEMENT RIGHT EYE;  Surgeon: Fabio Pierce, MD;  Location: [facility];  Service:  Ophthalmology;  Laterality: Right;  CDE: 3.89  . MULTIPLE TOOTH EXTRACTIONS    . TONSILLECTOMY      Family History  Adopted: Yes  Problem Relation Age of Onset  . Bipolar disorder Daughter   . Heart disease Daughter   . Asthma Daughter   . Bipolar disorder Son   . Bipolar disorder Daughter   . Bipolar disorder Daughter   . Bipolar disorder Daughter   . Hypertension Daughter   . Heart disease Daughter   . Drug abuse Daughter        OD    SOCIAL HISTORY: Social History   Socioeconomic History  . Marital status: Widowed    Spouse name: Not on file  . Number of children: 5  . Years of education: Not on file  . Highest education level: Not on file  Occupational History  . Occupation: retired    Comment: CNA  Tobacco Use  . Smoking status: Current Some Day Smoker    Packs/day: 0.50    Years: 50.00    Pack years: 25.00    Types: Cigarettes  . Smokeless tobacco: Never Used  Vaping Use  . Vaping Use: Never used  Substance and Sexual Activity  . Alcohol use: No  . Drug use: No  . Sexual activity: Not Currently    Birth control/protection: Surgical  Other Topics Concern  . Not on file  Social History Narrative  . Not on file   Social Determinants of Health   Financial Resource Strain:   . Difficulty of Paying Living  Expenses: Not on file  Food Insecurity:   . Worried About Programme researcher, broadcasting/film/video in the Last Year: Not on file  . Ran Out of Food in the Last Year: Not on file  Transportation Needs:   . Lack of Transportation (Medical): Not on file  . Lack of Transportation (Non-Medical): Not on file  Physical Activity:   . Days of Exercise per Week: Not on file  . Minutes of Exercise per Session: Not on file  Stress:   . Feeling of Stress : Not on file  Social Connections:   . Frequency of Communication with Friends and Family: Not on file  . Frequency of Social Gatherings with Friends and Family: Not on file  . Attends Religious Services: Not on file  . Active  Member of Clubs or Organizations: Not on file  . Attends Banker Meetings: Not on file  . Marital Status: Not on file  Intimate Partner Violence:   . Fear of Current or Ex-Partner: Not on file  . Emotionally Abused: Not on file  . Physically Abused: Not on file  . Sexually Abused: Not on file    Allergies  Allergen Reactions  . Penicillins Anaphylaxis, Swelling, Rash and Other (See Comments)    Has patient had a PCN reaction causing immediate rash, facial/tongue/throat swelling, SOB or lightheadedness with hypotension: yes Has patient had a PCN reaction causing severe rash involving mucus membranes or skin necrosis: no Has patient had a PCN reaction that required hospitalization: yes Has patient had a PCN reaction occurring within the last 10 years: no If all of the above answers are "NO", then may proceed with Cephalosporin use.     Current Outpatient Medications  Medication Sig Dispense Refill  . amLODipine (NORVASC) 10 MG tablet Take 1 tablet (10 mg total) by mouth daily. 90 tablet 3  . atorvastatin (LIPITOR) 40 MG tablet Take 1 tablet (40 mg total) by mouth daily. 30 tablet 5  . budesonide-formoterol (SYMBICORT) 160-4.5 MCG/ACT inhaler Inhale 2 puffs into the lungs 2 (two) times daily. 1 each 3  . clopidogrel (PLAVIX) 75 MG tablet Take 1 tablet (75 mg total) by mouth daily. 90 tablet 3  . escitalopram (LEXAPRO) 5 MG tablet Take 1 tablet (5 mg total) by mouth daily. 60 tablet 0  . losartan (COZAAR) 100 MG tablet Take 1 tablet (100 mg total) by mouth daily. 30 tablet 5  . PROAIR HFA 108 (90 Base) MCG/ACT inhaler Inhale 2 puffs into the lungs every 6 (six) hours as needed for wheezing or shortness of breath (cough). 18 g 3   No current facility-administered medications for this visit.    REVIEW OF SYSTEMS:  [X]  denotes positive finding, [ ]  denotes negative finding Cardiac  Comments:  Chest pain or chest pressure:    Shortness of breath upon exertion:    Short of  breath when lying flat:    Irregular heart rhythm:        Vascular    Pain in calf, thigh, or hip brought on by ambulation:    Pain in feet at night that wakes you up from your sleep:     Blood clot in your veins:    Leg swelling:         Pulmonary    Oxygen at home:    Productive cough:     Wheezing:         Neurologic    Sudden weakness in arms or legs:     Sudden  numbness in arms or legs:     Sudden onset of difficulty speaking or slurred speech:    Temporary loss of vision in one eye:     Problems with dizziness:         Gastrointestinal    Blood in stool:     Vomited blood:         Genitourinary    Burning when urinating:     Blood in urine:        Psychiatric    Major depression:         Hematologic    Bleeding problems:    Problems with blood clotting too easily:        Skin    Rashes or ulcers:        Constitutional    Fever or chills:      PHYSICAL EXAM: Vitals:   03/08/20 1021  BP: 131/86  Pulse: 67  Resp: 16  Temp: (!) 97.4 F (36.3 C)  TempSrc: Temporal  SpO2: 94%  Weight: 123 lb (55.8 kg)  Height: 5\' 1"  (1.549 m)    GENERAL: The patient is a well-nourished female, in no acute distress. The vital signs are documented above. CARDIAC: There is a regular rate and rhythm.  VASCULAR:  Palpable femoral pulses bilaterally Palpable dorsalis pedis pulses bilaterally PULMONARY: There is good air exchange bilaterally without wheezing or rales. ABDOMEN: Soft and non-tender with normal pitched bowel sounds.  MUSCULOSKELETAL: There are no major deformities or cyanosis. NEUROLOGIC: No focal weakness or paresthesias are detected. SKIN: There are no ulcers or rashes noted. PSYCHIATRIC: The patient has a normal affect.  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  There is no stranding and these appear likely  chronic.  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  Discussed with the patient that these are likely incidental findings and she has probably had these 2 focal outpouchings for a long time particularly given her tobacco abuse and she has a fairly diseased aorta with calcification.  No previous imaging for comparison.  She is asymptomatic and having no abdominal or back pain.  These are fairly small and I do not think they require any surgical intervention at this time.  I discussed the importance of smoking cessation and blood pressure control.  I will have her follow-up in 1 year with a repeat CTA abdomen pelvis for ongoing surveillance.   64, MD [facility] Office: [phone]

## 2020-03-17 ENCOUNTER — Ambulatory Visit: Payer: Medicare Other | Admitting: Family Medicine

## 2020-03-22 ENCOUNTER — Ambulatory Visit (INDEPENDENT_AMBULATORY_CARE_PROVIDER_SITE_OTHER): Payer: Medicare Other | Admitting: Nurse Practitioner

## 2020-03-22 ENCOUNTER — Encounter: Payer: Self-pay | Admitting: Nurse Practitioner

## 2020-03-22 ENCOUNTER — Other Ambulatory Visit: Payer: Self-pay

## 2020-03-22 ENCOUNTER — Ambulatory Visit: Payer: Medicare Other | Admitting: Family Medicine

## 2020-03-22 ENCOUNTER — Ambulatory Visit (INDEPENDENT_AMBULATORY_CARE_PROVIDER_SITE_OTHER): Payer: Medicare Other

## 2020-03-22 VITALS — BP 136/69 | HR 84 | Temp 97.3°F | Ht 61.0 in | Wt 125.0 lb

## 2020-03-22 DIAGNOSIS — F339 Major depressive disorder, recurrent, unspecified: Secondary | ICD-10-CM | POA: Diagnosis not present

## 2020-03-22 DIAGNOSIS — Z720 Tobacco use: Secondary | ICD-10-CM

## 2020-03-22 DIAGNOSIS — J690 Pneumonitis due to inhalation of food and vomit: Secondary | ICD-10-CM

## 2020-03-22 DIAGNOSIS — R0600 Dyspnea, unspecified: Secondary | ICD-10-CM | POA: Diagnosis not present

## 2020-03-22 DIAGNOSIS — G959 Disease of spinal cord, unspecified: Secondary | ICD-10-CM | POA: Diagnosis not present

## 2020-03-22 MED ORDER — IBUPROFEN 600 MG PO TABS: 600.0000 mg | ORAL_TABLET | Freq: Three times a day (TID) | ORAL | 0 refills | Status: DC | PRN

## 2020-03-22 MED ORDER — METHYLPREDNISOLONE ACETATE 40 MG/ML IJ SUSP
40.0000 mg | Freq: Once | INTRAMUSCULAR | Status: AC
  Administered 2020-03-22: 40 mg via INTRAMUSCULAR

## 2020-03-22 NOTE — Assessment & Plan Note (Signed)
Patient is not interested in quitting smoking today.  Continue to provide education printed handouts given.

## 2020-03-22 NOTE — Patient Instructions (Addendum)
Tobacco Use Disorder Tobacco use disorder (TUD) occurs when a person craves, seeks, and uses tobacco, regardless of the consequences. This disorder can cause problems with mental and physical health. It can affect your ability to have healthy relationships, and it can keep you from meeting your responsibilities at work, home, or school. Tobacco may be:  Smoked as a cigarette or cigar.  Inhaled using e-cigarettes.  Smoked in a pipe or hookah.  Chewed as smokeless tobacco.  Inhaled into the nostrils as snuff. Tobacco products contain a dangerous chemical called nicotine, which is very addictive. Nicotine triggers hormones that make the body feel stimulated and works on areas of the brain that make you feel good. These effects can make it hard for people to quit nicotine. Tobacco contains many other unsafe chemicals that can damage almost every organ in the body. Smoking tobacco also puts others in danger due to fire risk and possible health problems caused by breathing in secondhand smoke. What are the signs or symptoms? Symptoms of TUD may include:  Being unable to slow down or stop your tobacco use.  Spending an abnormal amount of time getting or using tobacco.  Craving tobacco. Cravings may last for up to 6 months after quitting.  Tobacco use that: ? Interferes with your work, school, or home life. ? Interferes with your personal and social relationships. ? Makes you give up activities that you once enjoyed or found important.  Using tobacco even though you know that it is: ? Dangerous or bad for your health or someone else's health. ? Causing problems in your life.  Needing more and more of the substance to get the same effect (developing tolerance).  Experiencing unpleasant symptoms if you do not use the substance (withdrawal). Withdrawal symptoms may include: ? Depressed, anxious, or irritable mood. ? Difficulty concentrating. ? Increased appetite. ? Restlessness or trouble  sleeping.  Using the substance to avoid withdrawal. How is this diagnosed? This condition may be diagnosed based on:  Your current and past tobacco use. Your health care provider may ask questions about how your tobacco use affects your life.  A physical exam. You may be diagnosed with TUD if you have at least two symptoms within a 12-month period. How is this treated? This condition is treated by stopping tobacco use. Many people are unable to quit on their own and need help. Treatment may include:  Nicotine replacement therapy (NRT). NRT provides nicotine without the other harmful chemicals in tobacco. NRT gradually lowers the dosage of nicotine in the body and reduces withdrawal symptoms. NRT is available as: ? Over-the-counter gums, lozenges, and skin patches. ? Prescription mouth inhalers and nasal sprays.  Medicine that acts on the brain to reduce cravings and withdrawal symptoms.  A type of talk therapy that examines your triggers for tobacco use, how to avoid them, and how to cope with cravings (behavioral therapy).  Hypnosis. This may help with withdrawal symptoms.  Joining a support group for others coping with TUD. The best treatment for TUD is usually a combination of medicine, talk therapy, and support groups. Recovery can be a long process. Many people start using tobacco again after stopping (relapse). If you relapse, it does not mean that treatment will not work. Follow these instructions at home:  Lifestyle  Do not use any products that contain nicotine or tobacco, such as cigarettes and e-cigarettes.  Avoid things that trigger tobacco use as much as you can. Triggers include people and situations that usually cause you   to use tobacco.  Avoid drinks that contain caffeine, including coffee. These may worsen some withdrawal symptoms.  Find ways to manage stress. Wanting to smoke may cause stress, and stress can make you want to smoke. Relaxation techniques such as  deep breathing, meditation, and yoga may help.  Attend support groups as needed. These groups are an important part of long-term recovery for many people. General instructions  Take over-the-counter and prescription medicines only as told by your health care provider.  Check with your health care provider before taking any new prescription or over-the-counter medicines.  Decide on a friend, family member, or smoking quit-line (such as 1-800-QUIT-NOW in the [place] that you can call or text when you feel the urge to smoke or when you need help coping with cravings.  Keep all follow-up visits as told by your health care provider and therapist. This is important. Contact a health care provider if:  You are not able to take your medicines as prescribed.  Your symptoms get worse, even with treatment. Summary  Tobacco use disorder (TUD) occurs when a person craves, seeks, and uses tobacco regardless of the consequences.  This condition may be diagnosed based on your current and past tobacco use and a physical exam.  Many people are unable to quit on their own and need help. Recovery can be a long process.  The most effective treatment for TUD is usually a combination of medicine, talk therapy, and support groups. This information is not intended to replace advice given to you by your health care provider. Make sure you discuss any questions you have with your health care provider. Document Revised: 05/01/2017 Document Reviewed: 05/01/2017 Elsevier Patient Education  2020 [employer] Acute Back Pain, Adult Acute back pain is sudden and usually short-lived. It is often caused by an injury to the muscles and tissues in the back. The injury may result from:  A muscle or ligament getting overstretched or torn (strained). Ligaments are tissues that connect bones to each other. Lifting something improperly can cause a back strain.  Wear and tear (degeneration) of the spinal disks. Spinal disks  are circular tissue that provides cushioning between the bones of the spine (vertebrae).  Twisting motions, such as while playing sports or doing yard work.  A hit to the back.  Arthritis. You may have a physical exam, lab tests, and imaging tests to find the cause of your pain. Acute back pain usually goes away with rest and home care. Follow these instructions at home: Managing pain, stiffness, and swelling  Take over-the-counter and prescription medicines only as told by your health care provider.  Your health care provider may recommend applying ice during the first 24-48 hours after your pain starts. To do this: ? Put ice in a plastic bag. ? Place a towel between your skin and the bag. ? Leave the ice on for 20 minutes, 2-3 times a day.  If directed, apply heat to the affected area as often as told by your health care provider. Use the heat source that your health care provider recommends, such as a moist heat pack or a heating pad. ? Place a towel between your skin and the heat source. ? Leave the heat on for 20-30 minutes. ? Remove the heat if your skin turns bright red. This is especially important if you are unable to feel pain, heat, or cold. You have a greater risk of getting burned. Activity   Do not stay in bed. Staying in bed  for more than 1-2 days can delay your recovery.  Sit up and stand up straight. Avoid leaning forward when you sit, or hunching over when you stand. ? If you work at a desk, sit close to it so you do not need to lean over. Keep your chin tucked in. Keep your neck drawn back, and keep your elbows bent at a right angle. Your arms should look like the letter "L." ? Sit high and close to the steering wheel when you drive. Add lower back (lumbar) support to your car seat, if needed.  Take short walks on even surfaces as soon as you are able. Try to increase the length of time you walk each day.  Do not sit, drive, or stand in one place for more than 30  minutes at a time. Sitting or standing for long periods of time can put stress on your back.  Do not drive or use heavy machinery while taking prescription pain medicine.  Use proper lifting techniques. When you bend and lift, use positions that put less stress on your back: ? [place] your knees. ? Keep the load close to your body. ? Avoid twisting.  Exercise regularly as told by your health care provider. Exercising helps your back heal faster and helps prevent back injuries by keeping muscles strong and flexible.  Work with a physical therapist to make a safe exercise program, as recommended by your health care provider. Do any exercises as told by your physical therapist. Lifestyle  Maintain a healthy weight. Extra weight puts stress on your back and makes it difficult to have good posture.  Avoid activities or situations that make you feel anxious or stressed. Stress and anxiety increase muscle tension and can make back pain worse. Learn ways to manage anxiety and stress, such as through exercise. General instructions  Sleep on a firm mattress in a comfortable position. Try lying on your side with your knees slightly bent. If you lie on your back, put a pillow under your knees.  Follow your treatment plan as told by your health care provider. This may include: ? Cognitive or behavioral therapy. ? Acupuncture or massage therapy. ? Meditation or yoga. Contact a health care provider if:  You have pain that is not relieved with rest or medicine.  You have increasing pain going down into your legs or buttocks.  Your pain does not improve after 2 weeks.  You have pain at night.  You lose weight without trying.  You have a fever or chills. Get help right away if:  You develop new bowel or bladder control problems.  You have unusual weakness or numbness in your arms or legs.  You develop nausea or vomiting.  You develop abdominal pain.  You feel faint. Summary  Acute back  pain is sudden and usually short-lived.  Use proper lifting techniques. When you bend and lift, use positions that put less stress on your back.  Take over-the-counter and prescription medicines and apply heat or ice as directed by your health care provider. This information is not intended to replace advice given to you by your health care provider. Make sure you discuss any questions you have with your health care provider. Document Revised: 09/02/2018 Document Reviewed: 12/26/2016 Elsevier Patient Education  2020 [employer]   Aspiration Pneumonia Aspiration pneumonia is an infection in the lungs. It occurs when saliva or liquid contaminated with bacteria is inhaled (aspirated) into the lungs. When these things get into the lungs, swelling (inflammation)  and infection can occur. This can make it difficult to breathe. Aspiration pneumonia is a serious condition and can be life threatening. What are the causes? This condition is caused when saliva or liquid from the mouth, throat, or stomach is inhaled into the lungs, and when those fluids are contaminated with bacteria. What increases the risk? The following factors may make you more likely to develop this condition:  A narrowing of the tube that carries food to the stomach (esophageal narrowing).  Having gastroesophageal reflux disease (GERD).  Having a weak immune system.  Having diabetes.  Having poor oral hygiene.  Being malnourished. The condition is more likely to occur when a person's cough (gag) reflex, or ability to swallow, has decreased. Some things that can cause this decrease include:  Having a brain injury or disease, such as stroke, seizures, Parkinson disease, dementia, or amyotrophic lateral sclerosis (ALS).  Being given a general anesthetic for procedures.  Drinking too much alcohol. If a person passes out and vomits, vomit can be inhaled into the lungs.  Taking certain medicines, such as tranquilizers or  sedatives. What are the signs or symptoms? Symptoms of this condition include:  Fever.  A cough with secretions that are yellow, tan, or green.  Breathing problems, such as wheezing or shortness of breath.  Chest pain.  Being more tired than usual (fatigue).  Having a history of coughing while eating or drinking.  Bad breath.  Bluish color to the lips, skin, or fingers. How is this diagnosed? This condition may be diagnosed based on:  A physical exam.  Tests, such as: ? Chest X-ray. ? Sputum culture. Saliva and mucus (sputum) are collected from the lungs or the tubes that carry air to the lungs (bronchi). The sputum is then tested for bacteria. ? Oximetry. A sensor or clip is placed on areas such as a finger, earlobe, or toe to measure the oxygen level in your blood. ? Blood tests. ? Swallowing study. This test looks at how food is swallowed and whether it goes into your breathing tube (trachea) or esophagus. ? Bronchoscopy. This test uses a flexible tube (bronchoscope) to see inside the lungs. How is this treated? This condition may be treated with:  Medicines. Antibiotic medicine will be given to kill the pneumonia bacteria. Other medicines may also be used to reduce fever or pain.  Breathing assistance and oxygen therapy. Depending on how well you are breathing, you may need to be given oxygen, or you may need breathing support from a breathing machine (ventilator).  Thoracentesis. This is a procedure to remove fluid that has built up in the space between the linings of the chest wall and the lungs.  Feeding tube and diet change. For people who have difficulty swallowing, a feeding tube might be placed in the stomach, or they may be asked to avoid certain food textures or liquids when eating. Follow these instructions at home: Medicines  Take over-the-counter and prescription medicines only as told by your health care provider. ? If you were prescribed an antibiotic  medicine, take it as told by your health care provider. Do not stop taking the antibiotic even if you start to feel better. ? Take cough medicine only if you are losing sleep. Cough medicine can prevent your body's natural ability to remove mucus from your lungs. General instructions  Carefully follow any eating instructions you were given, such as avoiding certain food textures or thickening your liquids. Thickening liquids reduces the risk of developing aspiration pneumonia  again.  Use breathing exercises such as postural drainage, deep breathing, and incentive spirometry to help expel secretions.  Rest as instructed by your health care provider.  Sleep in a semi-upright position at night. Try to sleep in a reclining chair, or place a few pillows under your head.  Do not use any products that contain nicotine or tobacco, such as cigarettes and e-cigarettes. If you need help quitting, ask your health care provider.  Keep all follow-up visits as told by your health care provider. This is important. Contact a health care provider if:  You have a fever.  You have a worsening cough with yellow, tan, or green secretions.  You have coughing while eating or drinking. Get help right away if:  You have worsening shortness of breath, wheezing, or difficulty breathing.  You have chest pain. Summary  Aspiration pneumonia is an infection in the lungs. It is caused when saliva or liquid from the mouth, throat, or stomach is inhaled into the lungs.  Aspiration pneumonia is more likely to occur when a person's cough reflex or ability to swallow has decreased.  Symptoms of aspiration pneumonia include coughing, breathing problems, fever, and chest pain.  Aspiration pneumonia may be treated with antibiotic medicine, other medicines to reduce pain or fever, and breathing assistance or oxygen therapy. This information is not intended to replace advice given to you by your health care provider. Make  sure you discuss any questions you have with your health care provider. Document Revised: 04/26/2017 Document Reviewed: 06/19/2016 Elsevier Patient Education  2020 [employer] Living With Depression Everyone experiences occasional disappointment, sadness, and loss in their lives. When you are feeling down, blue, or sad for at least 2 weeks in a row, it may mean that you have depression. Depression can affect your thoughts and feelings, relationships, daily activities, and physical health. It is caused by changes in the way your brain functions. If you receive a diagnosis of depression, your health care provider will tell you which type of depression you have and what treatment options are available to you. If you are living with depression, there are ways to help you recover from it and also ways to prevent it from coming back. How to cope with lifestyle changes Coping with stress     Stress is your body's reaction to life changes and events, both good and bad. Stressful situations may include:  Getting married.  The death of a spouse.  Losing a job.  Retiring.  Having a baby. Stress can last just a few hours or it can be ongoing. Stress can play a major role in depression, so it is important to learn both how to cope with stress and how to think about it differently. Talk with your health care provider or a counselor if you would like to learn more about stress reduction. He or she may suggest some stress reduction techniques, such as:  Music therapy. This can include creating music or listening to music. Choose music that you enjoy and that inspires you.  Mindfulness-based meditation. This kind of meditation can be done while sitting or walking. It involves being aware of your normal breaths, rather than trying to control your breathing.  Centering prayer. This is a kind of meditation that involves focusing on a spiritual word or phrase. Choose a word, phrase, or sacred image that  is meaningful to you and that brings you peace.  Deep breathing. To do this, expand your stomach and inhale slowly through your nose.  Hold your breath for 3-5 seconds, then exhale slowly, allowing your stomach muscles to relax.  Muscle relaxation. This involves intentionally tensing muscles then relaxing them. Choose a stress reduction technique that fits your lifestyle and personality. Stress reduction techniques take time and practice to develop. Set aside 5-15 minutes a day to do them. Therapists can offer training in these techniques. The training may be covered by some insurance plans. Other things you can do to manage stress include:  Keeping a stress diary. This can help you learn what triggers your stress and ways to control your response.  Understanding what your limits are and saying no to requests or events that lead to a schedule that is too full.  Thinking about how you respond to certain situations. You may not be able to control everything, but you can control how you react.  Adding humor to your life by watching funny films or TV shows.  Making time for activities that help you relax and not feeling guilty about spending your time this way.  Medicines Your health care provider may suggest certain medicines if he or she feels that they will help improve your condition. Avoid using alcohol and other substances that may prevent your medicines from working properly (may interact). It is also important to:  Talk with your pharmacist or health care provider about all the medicines that you take, their possible side effects, and what medicines are safe to take together.  Make it your goal to take part in all treatment decisions (shared decision-making). This includes giving input on the side effects of medicines. It is best if shared decision-making with your health care provider is part of your total treatment plan. If your health care provider prescribes a medicine, you may not  notice the full benefits of it for 4-8 weeks. Most people who are treated for depression need to be on medicine for at least 6-12 months after they feel better. If you are taking medicines as part of your treatment, do not stop taking medicines without first talking to your health care provider. You may need to have the medicine slowly decreased (tapered) over time to decrease the risk of harmful side effects. Relationships Your health care provider may suggest family therapy along with individual therapy and drug therapy. While there may not be family problems that are causing you to feel depressed, it is still important to make sure your family learns as much as they can about your mental health. Having your family's support can help make your treatment successful. How to recognize changes in your condition Everyone has a different response to treatment for depression. Recovery from major depression happens when you have not had signs of major depression for two months. This may mean that you will start to:  Have more interest in doing activities.  Feel less hopeless than you did 2 months ago.  Have more energy.  Overeat less often, or have better or improving appetite.  Have better concentration. Your health care provider will work with you to decide the next steps in your recovery. It is also important to recognize when your condition is getting worse. Watch for these signs:  Having fatigue or low energy.  Eating too much or too little.  Sleeping too much or too little.  Feeling restless, agitated, or hopeless.  Having trouble concentrating or making decisions.  Having unexplained physical complaints.  Feeling irritable, angry, or aggressive. Get help as soon as you or your family members notice these symptoms  coming back. How to get support and help from others How to talk with friends and family members about your condition  Talking to friends and family members about your  condition can provide you with one way to get support and guidance. Reach out to trusted friends or family members, explain your symptoms to them, and let them know that you are working with a health care provider to treat your depression. Financial resources Not all insurance plans cover mental health care, so it is important to check with your insurance carrier. If paying for co-pays or counseling services is a problem, search for a local or county mental health care center. They may be able to offer public mental health care services at low or no cost when you are not able to see a private health care provider. If you are taking medicine for depression, you may be able to get the generic form, which may be less expensive. Some makers of prescription medicines also offer help to patients who cannot afford the medicines they need. Follow these instructions at home:   Get the right amount and quality of sleep.  Cut down on using caffeine, tobacco, alcohol, and other potentially harmful substances.  Try to exercise, such as walking or lifting small weights.  Take over-the-counter and prescription medicines only as told by your health care provider.  Eat a healthy diet that includes plenty of vegetables, fruits, whole grains, low-fat dairy products, and lean protein. Do not eat a lot of foods that are high in solid fats, added sugars, or salt.  Keep all follow-up visits as told by your health care provider. This is important. Contact a health care provider if:  You stop taking your antidepressant medicines, and you have any of these symptoms: ? Nausea. ? Headache. ? Feeling lightheaded. ? Chills and body aches. ? Not being able to sleep (insomnia).  You or your friends and family think your depression is getting worse. Get help right away if:  You have thoughts of hurting yourself or others. If you ever feel like you may hurt yourself or others, or have thoughts about taking your own  life, get help right away. You can go to your nearest emergency department or call:  Your local emergency services (911 in the [place]  A suicide crisis helpline, such as the National Suicide Prevention Lifeline at 510-545-4194. This is open 24-hours a day. Summary  If you are living with depression, there are ways to help you recover from it and also ways to prevent it from coming back.  Work with your health care team to create a management plan that includes counseling, stress management techniques, and healthy lifestyle habits. This information is not intended to replace advice given to you by your health care provider. Make sure you discuss any questions you have with your health care provider. Document Revised: 09/05/2018 Document Reviewed: 04/16/2016 Elsevier Patient Education  2020 [employer].

## 2020-03-22 NOTE — Assessment & Plan Note (Addendum)
Patient following up 6 weeks depression.  Patient reports she did not take medication and "prayed over situation because she did not want to commit homicide and kill somebody with medication".  Patient denies ideation, thoughts of hurting herself and others.  PHQ completed score is 0.  We will continue to assess patient Provided education with printed handouts given.

## 2020-03-22 NOTE — Assessment & Plan Note (Signed)
Patient is a 79 year old female who is following up after hospital admission for aspiration pneumonia.  This is a 6 weeks follow-up.  Repeat chest x-ray to rule out pneumonia completed.  Results pending. Provided education to patient with printed handouts given.  Patient has no new concerns.  Patient denies any cough, congestion, fever. Follow-up with any symptoms.

## 2020-03-22 NOTE — Progress Notes (Signed)
Established Patient Office Visit  Subjective:  Patient ID: [name], female    DOB: 28-Mar-1941  Age: 79 y.o. MRN: [id number]  CC:  Chief Complaint  Patient presents with  . Depression    6 week follow up.  Patient states she never took the lexapro- she just prayed and she is doing better with her depression.  . Neck Pain    Patient states that she has been having right sided neck pain that has been goin on 1-2 months.    HPI [name] presents for aspiration Pneumonia follow up:  patient is following up 6 weeks after aspiration pneumonia. X-ray completed today to rule out pneumonia.  Patient is not reporting any signs or symptoms of pneumonia.  Patient denies any cough, shortness of breath, fever cough or chills.   Depression: Patient complains of depression. She complains of depressed mood. Onset was approximately 6 weeks ago, rapidly improving since that time.  She denies current suicidal and homicidal plan or intent.   Family history significant for depression and Bipolar.Possible organic causes contributing are: none.  Risk factors: positive family history in  daughter Previous treatment includes Lexapro. She complains of the following side effects from the treatment: none.  Patient reports not taking medication.    Past Medical History:  Diagnosis Date  . Arthritis    OA  . GERD (gastroesophageal reflux disease)   . Headache(784.0)   . Hepatitis    history of Hepatitis 20 years ago; not sure what kind  . History of gout   . Hypertension   . Hypertensive encephalopathy   . Stroke (HCC)    SLIGHT RT SIDE WEAKNESS 2001    Past Surgical History:  Procedure Laterality Date  . ABDOMINAL HYSTERECTOMY    . ANTERIOR CERVICAL CORPECTOMY  03/07/2012   Procedure: ANTERIOR CERVICAL CORPECTOMY;  Surgeon: Barnett Abu, MD;  Location: [facility];  Service: Neurosurgery;  Laterality: N/A;  Cervical six-seven, cervical seven-thoracic one Anterior cervical  decompression/diskectomy/fusion, with Cervical seven Corpectomy, reconstruction using Allograft and Alphatec plate  . CATARACT EXTRACTION W/PHACO Left 04/12/2017   Procedure: CATARACT EXTRACTION PHACO AND INTRAOCULAR LENS PLACEMENT (IOC);  Surgeon: Fabio Pierce, MD;  Location: [facility];  Service: Ophthalmology;  Laterality: Left;  CDE: 3.82  . CATARACT EXTRACTION W/PHACO Right 05/03/2017   Procedure: CATARACT EXTRACTION PHACO AND INTRAOCULAR LENS PLACEMENT RIGHT EYE;  Surgeon: Fabio Pierce, MD;  Location: [facility];  Service: Ophthalmology;  Laterality: Right;  CDE: 3.89  . MULTIPLE TOOTH EXTRACTIONS    . TONSILLECTOMY      Family History  Adopted: Yes  Problem Relation Age of Onset  . Bipolar disorder Daughter   . Heart disease Daughter   . Asthma Daughter   . Bipolar disorder Son   . Bipolar disorder Daughter   . Bipolar disorder Daughter   . Bipolar disorder Daughter   . Hypertension Daughter   . Heart disease Daughter   . Drug abuse Daughter        OD    Social History   Socioeconomic History  . Marital status: Widowed    Spouse name: Not on file  . Number of children: 5  . Years of education: Not on file  . Highest education level: Not on file  Occupational History  . Occupation: retired    Comment: CNA  Tobacco Use  . Smoking status: Current Some Day Smoker    Packs/day: 0.50    Years: 50.00    Pack years: 25.00  Types: Cigarettes  . Smokeless tobacco: Never Used  Vaping Use  . Vaping Use: Never used  Substance and Sexual Activity  . Alcohol use: No  . Drug use: No  . Sexual activity: Not Currently    Birth control/protection: Surgical  Other Topics Concern  . Not on file  Social History Narrative  . Not on file   Social Determinants of Health                                                                         Outpatient Medications Prior to Visit  Medication Sig Dispense Refill  . amLODipine (NORVASC) 10 MG tablet Take 1  tablet (10 mg total) by mouth daily. 90 tablet 3  . atorvastatin (LIPITOR) 40 MG tablet Take 1 tablet (40 mg total) by mouth daily. 30 tablet 5  . budesonide-formoterol (SYMBICORT) 160-4.5 MCG/ACT inhaler Inhale 2 puffs into the lungs 2 (two) times daily. 1 each 3  . clopidogrel (PLAVIX) 75 MG tablet Take 1 tablet (75 mg total) by mouth daily. 90 tablet 3  . losartan (COZAAR) 100 MG tablet Take 1 tablet (100 mg total) by mouth daily. 30 tablet 5  . PROAIR HFA 108 (90 Base) MCG/ACT inhaler Inhale 2 puffs into the lungs every 6 (six) hours as needed for wheezing or shortness of breath (cough). 18 g 3  . escitalopram (LEXAPRO) 5 MG tablet Take 1 tablet (5 mg total) by mouth daily. 60 tablet 0   No facility-administered medications prior to visit.    Allergies  Allergen Reactions  . Penicillins Anaphylaxis, Swelling, Rash and Other (See Comments)    Has patient had a PCN reaction causing immediate rash, facial/tongue/throat swelling, SOB or lightheadedness with hypotension: yes Has patient had a PCN reaction causing severe rash involving mucus membranes or skin necrosis: no Has patient had a PCN reaction that required hospitalization: yes Has patient had a PCN reaction occurring within the last 10 years: no If all of the above answers are "NO", then may proceed with Cephalosporin use.     ROS Review of Systems  Musculoskeletal: Positive for back pain.       Chronic  All other systems reviewed and are negative.     Objective:    Physical Exam Vitals reviewed.  Constitutional:      Appearance: Normal appearance.  HENT:     Head: Normocephalic.     Mouth/Throat:     Mouth: Mucous membranes are moist.  Cardiovascular:     Rate and Rhythm: Normal rate and regular rhythm.     Pulses: Normal pulses.     Heart sounds: Normal heart sounds.  Pulmonary:     Effort: Pulmonary effort is normal.     Breath sounds: Normal breath sounds.  Abdominal:     General: Bowel sounds are normal.    Musculoskeletal:        General: Tenderness present.     Cervical back: Normal range of motion.  Skin:    General: Skin is warm.  Neurological:     Mental Status: She is alert and oriented to person, place, and time.  Psychiatric:     Comments: Anxiety with depression     BP 136/69   Pulse 84  Temp (!) 97.3 F (36.3 C) (Temporal)   Ht 5\' 1"  (1.549 m)   Wt 125 lb (56.7 kg)   SpO2 97%   BMI 23.62 kg/m  Wt Readings from Last 3 Encounters:  03/22/20 125 lb (56.7 kg)  03/08/20 123 lb (55.8 kg)  02/04/20 128 lb (58.1 kg)     Health Maintenance Due  Topic Date Due  . Hepatitis C Screening  Never done  . INFLUENZA VACCINE  12/27/2019      Lab Results  Component Value Date   TSH 2.830 04/21/2019   Lab Results  Component Value Date   WBC 4.8 01/23/2020   HGB 15.2 (H) 01/23/2020   HCT 49.0 (H) 01/23/2020   MCV 98.8 01/23/2020   PLT 146 (L) 01/23/2020   Lab Results  Component Value Date   NA 140 01/23/2020   K 4.3 01/23/2020   CO2 25 01/23/2020   GLUCOSE 105 (H) 01/23/2020   BUN 14 01/23/2020   CREATININE 1.16 (H) 01/23/2020   BILITOT 0.9 01/22/2020   ALKPHOS 41 01/22/2020   AST 18 01/22/2020   ALT 9 01/22/2020   PROT 7.4 01/22/2020   ALBUMIN 4.1 01/22/2020   CALCIUM 8.6 (L) 01/23/2020   ANIONGAP 8 01/23/2020   Lab Results  Component Value Date   CHOL 151 01/24/2020   Lab Results  Component Value Date   HDL 58 01/24/2020   Lab Results  Component Value Date   LDLCALC 87 01/24/2020   Lab Results  Component Value Date   TRIG 32 01/24/2020   Lab Results  Component Value Date   CHOLHDL 2.6 01/24/2020   Lab Results  Component Value Date   HGBA1C 5.7 (H) 02/12/2012       GAD 7 : Generalized Anxiety Score 03/22/2020 02/04/2020 12/02/2019 08/26/2019  Nervous, Anxious, on Edge 2 3 0 2  Control/stop worrying 0 2 0 0  Worry too much - different things 0 3 0 0  Trouble relaxing 0 2 0 0  Restless 0 2 0 0  Easily annoyed or irritable 2 2 0 0   Afraid - awful might happen 0 0 0 0  Total GAD 7 Score 4 14 0 2  Anxiety Difficulty - Somewhat difficult Not difficult at all -    Assessment & Plan:   Problem List Items Addressed This Visit      Respiratory   Aspiration pneumonia (HCC) - Primary    Patient is a 79 year old female who is following up after hospital admission for aspiration pneumonia.  This is a 6 weeks follow-up.  Repeat chest x-ray to rule out pneumonia completed.  Results pending. Provided education to patient with printed handouts given.  Patient has no new concerns.  Patient denies any cough, congestion, fever. Follow-up with any symptoms.      Relevant Orders   DG Chest 2 View     Nervous and Auditory   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HCC)   Relevant Medications   ibuprofen (ADVIL) 600 MG tablet     Other   Depression, recurrent (HCC)    Patient following up 6 weeks depression.  Patient reports she did not take medication and "prayed over situation because she did not want to commit homicide and kill somebody with medication".  Patient denies ideation, thoughts of hurting herself and others.  PHQ completed score is 0.  We will continue to assess patient Provided education with printed handouts given.      Tobacco abuse    Patient is not interested  in quitting smoking today.  Continue to provide education printed handouts given.          Office Visit from 03/22/2020 in [facility]  PHQ-9 Total Score 0      Meds ordered this encounter  Medications  . ibuprofen (ADVIL) 600 MG tablet    Sig: Take 1 tablet (600 mg total) by mouth every 8 (eight) hours as needed.    Dispense:  30 tablet    Refill:  0    Order Specific Question:   Supervising Provider    Answer:   Arville Care A [id number]  . methylPREDNISolone acetate (DEPO-MEDROL) injection 40 mg    Follow-up: Return if symptoms worsen or fail to improve.    Daryll Drown, NP

## 2020-05-30 ENCOUNTER — Other Ambulatory Visit: Payer: Self-pay | Admitting: *Deleted

## 2020-05-30 DIAGNOSIS — G959 Disease of spinal cord, unspecified: Secondary | ICD-10-CM

## 2020-07-14 DIAGNOSIS — H5213 Myopia, bilateral: Secondary | ICD-10-CM | POA: Diagnosis not present

## 2020-08-18 ENCOUNTER — Telehealth: Payer: Self-pay

## 2020-08-18 NOTE — Telephone Encounter (Signed)
I have never seen patient, so it is fine with me.

## 2020-08-18 NOTE — Telephone Encounter (Signed)
Pt would like to switch providers. Pt wants to switch to [facility]. Do both providers approve?

## 2020-08-18 NOTE — Telephone Encounter (Signed)
If its okay by Britney am fine with.

## 2020-08-18 NOTE — Telephone Encounter (Signed)
Appointment scheduled.

## 2020-08-19 ENCOUNTER — Ambulatory Visit (INDEPENDENT_AMBULATORY_CARE_PROVIDER_SITE_OTHER): Payer: Medicare Other | Admitting: Nurse Practitioner

## 2020-08-19 ENCOUNTER — Other Ambulatory Visit: Payer: Self-pay

## 2020-08-19 ENCOUNTER — Encounter: Payer: Self-pay | Admitting: Nurse Practitioner

## 2020-08-19 VITALS — BP 134/78 | HR 83 | Temp 97.9°F | Ht 61.0 in | Wt 122.0 lb

## 2020-08-19 DIAGNOSIS — I10 Essential (primary) hypertension: Secondary | ICD-10-CM

## 2020-08-19 DIAGNOSIS — J449 Chronic obstructive pulmonary disease, unspecified: Secondary | ICD-10-CM | POA: Diagnosis not present

## 2020-08-19 DIAGNOSIS — E782 Mixed hyperlipidemia: Secondary | ICD-10-CM

## 2020-08-19 DIAGNOSIS — F17210 Nicotine dependence, cigarettes, uncomplicated: Secondary | ICD-10-CM | POA: Diagnosis not present

## 2020-08-19 DIAGNOSIS — Z720 Tobacco use: Secondary | ICD-10-CM

## 2020-08-19 DIAGNOSIS — I674 Hypertensive encephalopathy: Secondary | ICD-10-CM | POA: Diagnosis not present

## 2020-08-19 MED ORDER — NICOTINE 14 MG/24HR TD PT24: 14.0000 mg | MEDICATED_PATCH | Freq: Every day | TRANSDERMAL | 0 refills | Status: DC

## 2020-08-19 MED ORDER — ATORVASTATIN CALCIUM 40 MG PO TABS: 40.0000 mg | ORAL_TABLET | Freq: Every day | ORAL | 5 refills | Status: DC

## 2020-08-19 MED ORDER — CLOPIDOGREL BISULFATE 75 MG PO TABS: 75.0000 mg | ORAL_TABLET | Freq: Every day | ORAL | 3 refills | Status: DC

## 2020-08-19 MED ORDER — NICOTINE 14 MG/24HR TD PT24: 14.0000 mg | MEDICATED_PATCH | Freq: Every day | TRANSDERMAL | 6 refills | Status: DC

## 2020-08-19 MED ORDER — MELOXICAM 15 MG PO TABS: 15.0000 mg | ORAL_TABLET | Freq: Every day | ORAL | 1 refills | Status: DC

## 2020-08-19 MED ORDER — AMLODIPINE BESYLATE 10 MG PO TABS: 10.0000 mg | ORAL_TABLET | Freq: Every day | ORAL | 3 refills | Status: DC

## 2020-08-19 MED ORDER — BUDESONIDE-FORMOTEROL FUMARATE 160-4.5 MCG/ACT IN AERO: 2.0000 | INHALATION_SPRAY | Freq: Two times a day (BID) | RESPIRATORY_TRACT | 3 refills | Status: DC

## 2020-08-19 MED ORDER — PROAIR HFA 108 (90 BASE) MCG/ACT IN AERS: 2.0000 | INHALATION_SPRAY | Freq: Four times a day (QID) | RESPIRATORY_TRACT | 3 refills | Status: DC | PRN

## 2020-08-19 NOTE — Assessment & Plan Note (Addendum)
Completed lipid panel today results pending.  Current medication Lipitor 40 mg tablet by mouth daily.

## 2020-08-19 NOTE — Assessment & Plan Note (Signed)
Well-controlled on current medication.  Amlodipine 10 mg tablet by mouth daily.

## 2020-08-19 NOTE — Assessment & Plan Note (Signed)
Started patient on nicotine patch follow-up in 7 to 12 weeks.

## 2020-08-19 NOTE — Patient Instructions (Signed)
Follow up in 3 months for chronic disease management and tobacco cessation Cholesterol Content in Foods Cholesterol is a waxy, fat-like substance that helps to carry fat in the blood. The body needs cholesterol in small amounts, but too much cholesterol can cause damage to the arteries and heart. Most people should eat less than 200 milligrams (mg) of cholesterol a day. Foods with cholesterol Cholesterol is found in animal-based foods, such as meat, seafood, and dairy. Generally, low-fat dairy and lean meats have less cholesterol than full-fat dairy and fatty meats. The milligrams of cholesterol per serving (mg per serving) of common cholesterol-containing foods are listed below. Meat and other proteins  Egg -- one large whole egg has 186 mg.  Veal shank -- 4 oz has 141 mg.  Lean ground [name] (93% lean) -- 4 oz has 118 mg.  Fat-trimmed lamb loin -- 4 oz has 106 mg.  Lean ground beef (90% lean) -- 4 oz has 100 mg.  Lobster -- 3.5 oz has 90 mg.  Pork loin chops -- 4 oz has 86 mg.  Canned salmon -- 3.5 oz has 83 mg.  Fat-trimmed beef top loin -- 4 oz has 78 mg.  Frankfurter -- 1 frank (3.5 oz) has 77 mg.  Crab -- 3.5 oz has 71 mg.  Roasted chicken without skin, white meat -- 4 oz has 66 mg.  Light bologna -- 2 oz has 45 mg.  Deli-cut [name] -- 2 oz has 31 mg.  Canned tuna -- 3.5 oz has 31 mg.  [name] -- 1 oz has 29 mg.  Oysters and mussels (raw) -- 3.5 oz has 25 mg.  Mackerel -- 1 oz has 22 mg.  Trout -- 1 oz has 20 mg.  Pork sausage -- 1 link (1 oz) has 17 mg.  Salmon -- 1 oz has 16 mg.  Tilapia -- 1 oz has 14 mg. Dairy  Soft-serve ice cream --  cup (4 oz) has 103 mg.  Whole-milk yogurt -- 1 cup (8 oz) has 29 mg.  Cheddar cheese -- 1 oz has 28 mg.  American cheese -- 1 oz has 28 mg.  Whole milk -- 1 cup (8 oz) has 23 mg.  2% milk -- 1 cup (8 oz) has 18 mg.  Cream cheese -- 1 tablespoon (Tbsp) has 15 mg.  Cottage cheese --  cup (4 oz) has 14  mg.  Low-fat (1%) milk -- 1 cup (8 oz) has 10 mg.  Sour cream -- 1 Tbsp has 8.5 mg.  Low-fat yogurt -- 1 cup (8 oz) has 8 mg.  Nonfat Greek yogurt -- 1 cup (8 oz) has 7 mg.  Half-and-half cream -- 1 Tbsp has 5 mg. Fats and oils  Cod liver oil -- 1 tablespoon (Tbsp) has 82 mg.  Butter -- 1 Tbsp has 15 mg.  Lard -- 1 Tbsp has 14 mg.  Bacon grease -- 1 Tbsp has 14 mg.  Mayonnaise -- 1 Tbsp has 5-10 mg.  Margarine -- 1 Tbsp has 3-10 mg. Exact amounts of cholesterol in these foods may vary depending on specific ingredients and brands.   Foods without cholesterol Most plant-based foods do not have cholesterol unless you combine them with a food that has cholesterol. Foods without cholesterol include:  Grains and cereals.  Vegetables.  Fruits.  Vegetable oils, such as olive, canola, and sunflower oil.  Legumes, such as peas, beans, and lentils.  Nuts and seeds.  Egg whites.   Summary  The body needs cholesterol in  small amounts, but too much cholesterol can cause damage to the arteries and heart.  Most people should eat less than 200 milligrams (mg) of cholesterol a day. This information is not intended to replace advice given to you by your health care provider. Make sure you discuss any questions you have with your health care provider. Document Revised: 10/05/2019 Document Reviewed: 10/05/2019 Elsevier Patient Education  2021 Elsevier Inc. Hypertension, Adult Hypertension is another name for high blood pressure. High blood pressure forces your heart to work harder to pump blood. This can cause problems over time. There are two numbers in a blood pressure reading. There is a top number (systolic) over a bottom number (diastolic). It is best to have a blood pressure that is below 120/80. Healthy choices can help lower your blood pressure, or you may need medicine to help lower it. What are the causes? The cause of this condition is not known. Some conditions may be  related to high blood pressure. What increases the risk?  Smoking.  Having type 2 diabetes mellitus, high cholesterol, or both.  Not getting enough exercise or physical activity.  Being overweight.  Having too much fat, sugar, calories, or salt (sodium) in your diet.  Drinking too much alcohol.  Having long-term (chronic) kidney disease.  Having a family history of high blood pressure.  Age. Risk increases with age.  Race. You may be at higher risk if you are African American.  Gender. Men are at higher risk than women before age 58. After age 73, women are at higher risk than men.  Having obstructive sleep apnea.  Stress. What are the signs or symptoms?  High blood pressure may not cause symptoms. Very high blood pressure (hypertensive crisis) may cause: ? Headache. ? Feelings of worry or nervousness (anxiety). ? Shortness of breath. ? Nosebleed. ? A feeling of being sick to your stomach (nausea). ? Throwing up (vomiting). ? Changes in how you see. ? Very bad chest pain. ? Seizures. How is this treated?  This condition is treated by making healthy lifestyle changes, such as: ? Eating healthy foods. ? Exercising more. ? Drinking less alcohol.  Your health care provider may prescribe medicine if lifestyle changes are not enough to get your blood pressure under control, and if: ? Your top number is above 130. ? Your bottom number is above 80.  Your personal target blood pressure may vary. Follow these instructions at home: Eating and drinking  If told, follow the DASH eating plan. To follow this plan: ? Fill one half of your plate at each meal with fruits and vegetables. ? Fill one fourth of your plate at each meal with whole grains. Whole grains include whole-wheat pasta, brown rice, and whole-grain bread. ? Eat or drink low-fat dairy products, such as skim milk or low-fat yogurt. ? Fill one fourth of your plate at each meal with low-fat (lean) proteins.  Low-fat proteins include fish, chicken without skin, eggs, beans, and tofu. ? Avoid fatty meat, cured and processed meat, or chicken with skin. ? Avoid pre-made or processed food.  Eat less than 1,500 mg of salt each day.  Do not drink alcohol if: ? Your doctor tells you not to drink. ? You are pregnant, may be pregnant, or are planning to become pregnant.  If you drink alcohol: ? Limit how much you use to:  0-1 drink a day for women.  0-2 drinks a day for men. ? Be aware of how much alcohol is in  your drink. In the [place] one drink equals one 12 oz bottle of beer (355 mL), one 5 oz glass of wine (148 mL), or one 1 oz glass of hard liquor (44 mL).   Lifestyle  Work with your doctor to stay at a healthy weight or to lose weight. Ask your doctor what the best weight is for you.  Get at least 30 minutes of exercise most days of the week. This may include walking, swimming, or biking.  Get at least 30 minutes of exercise that strengthens your muscles (resistance exercise) at least 3 days a week. This may include lifting weights or doing Pilates.  Do not use any products that contain nicotine or tobacco, such as cigarettes, e-cigarettes, and chewing tobacco. If you need help quitting, ask your doctor.  Check your blood pressure at home as told by your doctor.  Keep all follow-up visits as told by your doctor. This is important.   Medicines  Take over-the-counter and prescription medicines only as told by your doctor. Follow directions carefully.  Do not skip doses of blood pressure medicine. The medicine does not work as well if you skip doses. Skipping doses also puts you at risk for problems.  Ask your doctor about side effects or reactions to medicines that you should watch for. Contact a doctor if you:  Think you are having a reaction to the medicine you are taking.  Have headaches that keep coming back (recurring).  Feel dizzy.  Have swelling in your ankles.  Have  trouble with your vision. Get help right away if you:  Get a very bad headache.  Start to feel mixed up (confused).  Feel weak or numb.  Feel faint.  Have very bad pain in your: ? Chest. ? Belly (abdomen).  Throw up more than once.  Have trouble breathing. Summary  Hypertension is another name for high blood pressure.  High blood pressure forces your heart to work harder to pump blood.  For most people, a normal blood pressure is less than 120/80.  Making healthy choices can help lower blood pressure. If your blood pressure does not get lower with healthy choices, you may need to take medicine. This information is not intended to replace advice given to you by your health care provider. Make sure you discuss any questions you have with your health care provider. Document Revised: 01/22/2018 Document Reviewed: 01/22/2018 Elsevier Patient Education  2021 Elsevier Inc. COPD and Physical Activity Chronic obstructive pulmonary disease (COPD) is a long-term (chronic) condition that affects the lungs. COPD is a general term that can be used to describe many different lung problems that cause lung swelling (inflammation) and limit airflow, including chronic bronchitis and emphysema. The main symptom of COPD is shortness of breath, which makes it harder to do even simple tasks. This can also make it harder to exercise and be active. Talk with your health care provider about treatments to help you breathe better and actions you can take to prevent breathing problems during physical activity. What are the benefits of exercising with COPD? Exercising regularly is an important part of a healthy lifestyle. You can still exercise and do physical activities even though you have COPD. Exercise and physical activity improve your shortness of breath by increasing blood flow (circulation). This causes your heart to pump more oxygen through your body. Moderate exercise can improve your:  Oxygen  use.  Energy level.  Shortness of breath.  Strength in your breathing muscles.  Heart health.  Sleep.  Self-esteem and feelings of self-worth.  Depression, stress, and anxiety levels. Exercise can benefit everyone with COPD. The severity of your disease may affect how hard you can exercise, especially at first, but everyone can benefit. Talk with your health care provider about how much exercise is safe for you, and which activities and exercises are safe for you.   What actions can I take to prevent breathing problems during physical activity?  Sign up for a pulmonary rehabilitation program. This type of program may include: ? Education about lung diseases. ? Exercise classes that teach you how to exercise and be more active while improving your breathing. This usually involves:  Exercise using your lower extremities, such as a stationary bicycle.  About 30 minutes of exercise, 2 to 5 times per week, for 6 to 12 weeks  Strength training, such as push ups or leg lifts. ? Nutrition education. ? Group classes in which you can talk with others who also have COPD and learn ways to manage stress.  If you use an oxygen tank, you should use it while you exercise. Work with your health care provider to adjust your oxygen for your physical activity. Your resting flow rate is different from your flow rate during physical activity.  While you are exercising: ? Take slow breaths. ? Pace yourself and do not try to go too fast. ? Purse your lips while breathing out. Pursing your lips is similar to a kissing or whistling position. ? If doing exercise that uses a quick burst of effort, such as weight lifting:  Breathe in before starting the exercise.  Breathe out during the hardest part of the exercise (such as raising the weights). Where to find support You can find support for exercising with COPD from:  Your health care provider.  A pulmonary rehabilitation program.  Your local  health department or community health programs.  Support groups, online or in-person. Your health care provider may be able to recommend support groups. Where to find more information You can find more information about exercising with COPD from:  American Lung Association: [place].hulung.org.  COPD Foundation: [employer].glcopdfoundation.org. Contact a health care provider if:  Your symptoms get worse.  You have chest pain.  You have nausea.  You have a fever.  You have trouble talking or catching your breath.  You want to start a new exercise program or a new activity. Summary  COPD is a general term that can be used to describe many different lung problems that cause lung swelling (inflammation) and limit airflow. This includes chronic bronchitis and emphysema.  Exercise and physical activity improve your shortness of breath by increasing blood flow (circulation). This causes your heart to provide more oxygen to your body.  Contact your health care provider before starting any exercise program or new activity. Ask your health care provider what exercises and activities are safe for you. This information is not intended to replace advice given to you by your health care provider. Make sure you discuss any questions you have with your health care provider. Document Revised: 09/03/2018 Document Reviewed: 06/06/2017 Elsevier Patient Education  2021 [employer]

## 2020-08-19 NOTE — Progress Notes (Signed)
Established Patient Office Visit  Subjective:  Patient ID: [name], female    DOB: [birth year]  Age: 79 y.o. MRN: [id number]  CC:  Chief Complaint  Patient presents with  . Medical Management of Chronic Issues  . Cough    HPI [name] presents for follow up of hypertension. Patient was diagnosed in 02/13/2012. The patient is tolerating the medication well without side effects. Compliance with treatment has been good; including taking medication as directed , maintains a healthy diet and regular exercise regimen , and following up as directed.  Current medication amlodipine 10 mg tablet by mouth daily.  Mixed hyperlipidemia  Pt presents with hyperlipidemia. Patient was diagnosed in 07/09/2016 . Compliance with treatment has been good; The patient is compliant with medications, maintains a low cholesterol diet , follows up as directed , and maintains an exercise regimen . The patient denies experiencing any hypercholesterolemia related symptoms.  Current medication Lipitor 40 mg tablet by mouth daily  Smoking cessation instruction/counseling given:  counseled patient on the dangers of tobacco use, advised patient to stop smoking, and reviewed strategies to maximize success  Patient is ready to start process of smoking cessation.   Past Medical History:  Diagnosis Date  . Arthritis    OA  . GERD (gastroesophageal reflux disease)   . Headache(784.0)   . Hepatitis    history of Hepatitis 20 years ago; not sure what kind  . History of gout   . Hypertension   . Hypertensive encephalopathy   . Stroke (HCC)    SLIGHT RT SIDE WEAKNESS 2001    Past Surgical History:  Procedure Laterality Date  . ABDOMINAL HYSTERECTOMY    . ANTERIOR CERVICAL CORPECTOMY  03/07/2012   Procedure: ANTERIOR CERVICAL CORPECTOMY;  Surgeon: Barnett Abu, MD;  Location: [facility];  Service: Neurosurgery;  Laterality: N/A;  Cervical six-seven, cervical seven-thoracic one Anterior cervical  decompression/diskectomy/fusion, with Cervical seven Corpectomy, reconstruction using Allograft and Alphatec plate  . CATARACT EXTRACTION W/PHACO Left 04/12/2017   Procedure: CATARACT EXTRACTION PHACO AND INTRAOCULAR LENS PLACEMENT ([facility]);  Surgeon: Fabio Pierce, MD;  Location: [facility];  Service: Ophthalmology;  Laterality: Left;  CDE: 3.82  . CATARACT EXTRACTION W/PHACO Right 05/03/2017   Procedure: CATARACT EXTRACTION PHACO AND INTRAOCULAR LENS PLACEMENT RIGHT EYE;  Surgeon: Fabio Pierce, MD;  Location: [facility];  Service: Ophthalmology;  Laterality: Right;  CDE: 3.89  . MULTIPLE TOOTH EXTRACTIONS    . TONSILLECTOMY      Family History  Adopted: Yes  Problem Relation Age of Onset  . Bipolar disorder Daughter   . Heart disease Daughter   . Asthma Daughter   . Bipolar disorder Son   . Bipolar disorder Daughter   . Bipolar disorder Daughter   . Bipolar disorder Daughter   . Hypertension Daughter   . Heart disease Daughter   . Drug abuse Daughter        OD    Social History   Socioeconomic History  . Marital status: Widowed    Spouse name: Not on file  . Number of children: 5  . Years of education: Not on file  . Highest education level: Not on file  Occupational History  . Occupation: retired    Comment: CNA  Tobacco Use  . Smoking status: Current Some Day Smoker    Packs/day: 0.50    Years: 50.00    Pack years: 25.00    Types: Cigarettes  . Smokeless tobacco: Never Used  Vaping Use  .  Vaping Use: Never used  Substance and Sexual Activity  . Alcohol use: No  . Drug use: No  . Sexual activity: Not Currently    Birth control/protection: Surgical  Other Topics Concern  . Not on file  Social History Narrative  . Not on file   Social Determinants of Health   Financial Resource Strain: Not on file  Food Insecurity: Not on file  Transportation Needs: Not on file  Physical Activity: Not on file  Stress: Not on file  Social Connections: Not on file  Intimate  Partner Violence: Not on file    Outpatient Medications Prior to Visit  Medication Sig Dispense Refill  . amLODipine (NORVASC) 10 MG tablet Take 1 tablet (10 mg total) by mouth daily. 90 tablet 3  . atorvastatin (LIPITOR) 40 MG tablet Take 1 tablet (40 mg total) by mouth daily. 30 tablet 5  . budesonide-formoterol (SYMBICORT) 160-4.5 MCG/ACT inhaler Inhale 2 puffs into the lungs 2 (two) times daily. 1 each 3  . clopidogrel (PLAVIX) 75 MG tablet Take 1 tablet (75 mg total) by mouth daily. 90 tablet 3  . PROAIR HFA 108 (90 Base) MCG/ACT inhaler Inhale 2 puffs into the lungs every 6 (six) hours as needed for wheezing or shortness of breath (cough). 18 g 3  . losartan (COZAAR) 100 MG tablet Take 1 tablet (100 mg total) by mouth daily. 30 tablet 5  . meloxicam (MOBIC) 15 MG tablet Take 15 mg by mouth daily.    [name] ibuprofen (ADVIL) 600 MG tablet Take 1 tablet (600 mg total) by mouth every 8 (eight) hours as needed. 30 tablet 0   No facility-administered medications prior to visit.    Allergies  Allergen Reactions  . Penicillins Anaphylaxis, Swelling, Rash and Other (See Comments)    Has patient had a PCN reaction causing immediate rash, facial/tongue/throat swelling, SOB or lightheadedness with hypotension: yes Has patient had a PCN reaction causing severe rash involving mucus membranes or skin necrosis: no Has patient had a PCN reaction that required hospitalization: yes Has patient had a PCN reaction occurring within the last 10 years: no If all of the above answers are "NO", then may proceed with Cephalosporin use.     ROS Review of Systems  Constitutional: Negative.   HENT: Negative.   Eyes: Negative.   Respiratory: Negative.   Cardiovascular: Negative.   Gastrointestinal: Negative.   Genitourinary: Negative.   Musculoskeletal: Negative.   Neurological: Negative.   Psychiatric/Behavioral: Negative.   All other systems reviewed and are negative.     Objective:    Physical  Exam Vitals reviewed.  Constitutional:      Appearance: Normal appearance.  HENT:     Head: Normocephalic.     Nose: Nose normal.  Cardiovascular:     Rate and Rhythm: Normal rate and regular rhythm.     Pulses: Normal pulses.     Heart sounds: Normal heart sounds.  Pulmonary:     Effort: Pulmonary effort is normal.     Breath sounds: Normal breath sounds.  Abdominal:     General: Bowel sounds are normal.  Musculoskeletal:        General: Normal range of motion.  Skin:    General: Skin is warm.  Neurological:     Mental Status: She is alert and oriented to person, place, and time.  Psychiatric:     Comments: Hx depression     BP 134/78   Pulse 83   Temp 97.9 F (36.6 C) (Temporal)  Ht 5\' 1"  (1.549 m)   Wt 122 lb (55.3 kg)   BMI 23.05 kg/m  Wt Readings from Last 3 Encounters:  08/19/20 122 lb (55.3 kg)  03/22/20 125 lb (56.7 kg)  03/08/20 123 lb (55.8 kg)     Health Maintenance Due  Topic Date Due  . Hepatitis C Screening  Never done      Lab Results  Component Value Date   TSH 2.830 04/21/2019   Lab Results  Component Value Date   WBC 4.8 01/23/2020   HGB 15.2 (H) 01/23/2020   HCT 49.0 (H) 01/23/2020   MCV 98.8 01/23/2020   PLT 146 (L) 01/23/2020   Lab Results  Component Value Date   NA 140 01/23/2020   K 4.3 01/23/2020   CO2 25 01/23/2020   GLUCOSE 105 (H) 01/23/2020   BUN 14 01/23/2020   CREATININE 1.16 (H) 01/23/2020   BILITOT 0.9 01/22/2020   ALKPHOS 41 01/22/2020   AST 18 01/22/2020   ALT 9 01/22/2020   PROT 7.4 01/22/2020   ALBUMIN 4.1 01/22/2020   CALCIUM 8.6 (L) 01/23/2020   ANIONGAP 8 01/23/2020   Lab Results  Component Value Date   CHOL 151 01/24/2020   Lab Results  Component Value Date   HDL 58 01/24/2020   Lab Results  Component Value Date   LDLCALC 87 01/24/2020   Lab Results  Component Value Date   TRIG 32 01/24/2020   Lab Results  Component Value Date   CHOLHDL 2.6 01/24/2020   Lab Results  Component  Value Date   HGBA1C 5.7 (H) 02/12/2012      Assessment & Plan:   Problem List Items Addressed This Visit      Respiratory   COPD with chronic bronchitis (HCC)    Well-controlled on current medication.      Relevant Medications   PROAIR HFA 108 (90 Base) MCG/ACT inhaler   budesonide-formoterol (SYMBICORT) 160-4.5 MCG/ACT inhaler   Other Relevant Orders   Hepatitis C antibody     Nervous and Auditory   Hypertensive encephalopathy    Well-controlled on current medication.  Amlodipine 10 mg tablet by mouth daily.        Other   Cigarette nicotine dependence without complication    Patient is ready to start quitting smoking cigarettes.  We will start patient on nicotine patch and follow-up in 7 to 12 weeks      Hyperlipidemia - Primary    Completed lipid panel today results pending.  Current medication Lipitor 40 mg tablet by mouth daily.      Relevant Medications   amLODipine (NORVASC) 10 MG tablet   atorvastatin (LIPITOR) 40 MG tablet   Other Relevant Orders   Lipid Panel   Tobacco abuse    Started patient on nicotine patch follow-up in 7 to 12 weeks.       Other Visit Diagnoses    Essential hypertension       Relevant Medications   amLODipine (NORVASC) 10 MG tablet   atorvastatin (LIPITOR) 40 MG tablet   Other Relevant Orders   CBC with Differential   Comprehensive metabolic panel      Meds ordered this encounter  Medications  . PROAIR HFA 108 (90 Base) MCG/ACT inhaler    Sig: Inhale 2 puffs into the lungs every 6 (six) hours as needed for wheezing or shortness of breath (cough).    Dispense:  18 g    Refill:  3  . amLODipine (NORVASC) 10 MG tablet  Sig: Take 1 tablet (10 mg total) by mouth daily.    Dispense:  90 tablet    Refill:  3  . meloxicam (MOBIC) 15 MG tablet    Sig: Take 1 tablet (15 mg total) by mouth daily.    Dispense:  60 tablet    Refill:  1  . clopidogrel (PLAVIX) 75 MG tablet    Sig: Take 1 tablet (75 mg total) by mouth daily.     Dispense:  90 tablet    Refill:  3  . budesonide-formoterol (SYMBICORT) 160-4.5 MCG/ACT inhaler    Sig: Inhale 2 puffs into the lungs 2 (two) times daily.    Dispense:  1 each    Refill:  3  . atorvastatin (LIPITOR) 40 MG tablet    Sig: Take 1 tablet (40 mg total) by mouth daily.    Dispense:  30 tablet    Refill:  5    Follow-up: Return in about 3 months (around 11/19/2020).    Daryll Drown, NP

## 2020-08-19 NOTE — Assessment & Plan Note (Signed)
Well- controlled on current medication 

## 2020-08-19 NOTE — Assessment & Plan Note (Signed)
Patient is ready to start quitting smoking cigarettes.  We will start patient on nicotine patch and follow-up in 7 to 12 weeks

## 2020-08-20 LAB — CBC WITH DIFFERENTIAL/PLATELET
Basophils Absolute: 0.1 10*3/uL (ref 0.0–0.2)
Basos: 1 %
EOS (ABSOLUTE): 0.2 10*3/uL (ref 0.0–0.4)
Eos: 4 %
Hematocrit: 43.4 % (ref 34.0–46.6)
Hemoglobin: 14.5 g/dL (ref 11.1–15.9)
Immature Grans (Abs): 0 10*3/uL (ref 0.0–0.1)
Immature Granulocytes: 0 %
Lymphocytes Absolute: 1.4 10*3/uL (ref 0.7–3.1)
Lymphs: 28 %
MCH: 30.6 pg (ref 26.6–33.0)
MCHC: 33.4 g/dL (ref 31.5–35.7)
MCV: 92 fL (ref 79–97)
Monocytes Absolute: 0.4 10*3/uL (ref 0.1–0.9)
Monocytes: 7 %
Neutrophils Absolute: 3 10*3/uL (ref 1.4–7.0)
Neutrophils: 60 %
Platelets: 131 10*3/uL — ABNORMAL LOW (ref 150–450)
RBC: 4.74 x10E6/uL (ref 3.77–5.28)
RDW: 11.8 % (ref 11.7–15.4)
WBC: 5 10*3/uL (ref 3.4–10.8)

## 2020-08-20 LAB — COMPREHENSIVE METABOLIC PANEL
ALT: 8 IU/L (ref 0–32)
AST: 16 IU/L (ref 0–40)
Albumin/Globulin Ratio: 1.8 (ref 1.2–2.2)
Albumin: 4.1 g/dL (ref 3.7–4.7)
Alkaline Phosphatase: 47 IU/L (ref 44–121)
BUN/Creatinine Ratio: 12 (ref 12–28)
BUN: 13 mg/dL (ref 8–27)
Bilirubin Total: 0.4 mg/dL (ref 0.0–1.2)
CO2: 23 mmol/L (ref 20–29)
Calcium: 9 mg/dL (ref 8.7–10.3)
Chloride: 103 mmol/L (ref 96–106)
Creatinine, Ser: 1.09 mg/dL — ABNORMAL HIGH (ref 0.57–1.00)
Globulin, Total: 2.3 g/dL (ref 1.5–4.5)
Glucose: 96 mg/dL (ref 65–99)
Potassium: 3.9 mmol/L (ref 3.5–5.2)
Sodium: 145 mmol/L — ABNORMAL HIGH (ref 134–144)
Total Protein: 6.4 g/dL (ref 6.0–8.5)
eGFR: 52 mL/min/{1.73_m2} — ABNORMAL LOW (ref >59)

## 2020-08-20 LAB — LIPID PANEL
Chol/HDL Ratio: 2 ratio (ref 0.0–4.4)
Cholesterol, Total: 161 mg/dL (ref 100–199)
HDL: 79 mg/dL (ref >39)
LDL Chol Calc (NIH): 71 mg/dL (ref 0–99)
Triglycerides: 56 mg/dL (ref 0–149)
VLDL Cholesterol Cal: 11 mg/dL (ref 5–40)

## 2020-08-20 LAB — HEPATITIS C ANTIBODY: Hep C Virus Ab: >11 s/co ratio — ABNORMAL HIGH (ref 0.0–0.9)

## 2020-08-21 ENCOUNTER — Other Ambulatory Visit: Payer: Self-pay | Admitting: Nurse Practitioner

## 2020-08-21 DIAGNOSIS — R768 Other specified abnormal immunological findings in serum: Secondary | ICD-10-CM

## 2020-08-24 ENCOUNTER — Telehealth: Payer: Self-pay

## 2020-08-24 NOTE — Telephone Encounter (Signed)
Pt had another lab added on to her previous bloodwork on 3/27. She is wanting to know of the results. She had a positive Hep C antibody.  Informed pt that the results are not back yet and that we would call her once provider reviews.

## 2020-08-29 DIAGNOSIS — H524 Presbyopia: Secondary | ICD-10-CM | POA: Diagnosis not present

## 2020-08-29 DIAGNOSIS — H52223 Regular astigmatism, bilateral: Secondary | ICD-10-CM | POA: Diagnosis not present

## 2020-08-30 ENCOUNTER — Telehealth: Payer: Self-pay

## 2020-08-30 NOTE — Telephone Encounter (Signed)
LMTCB

## 2020-09-02 ENCOUNTER — Other Ambulatory Visit: Payer: Self-pay

## 2020-09-02 DIAGNOSIS — E782 Mixed hyperlipidemia: Secondary | ICD-10-CM

## 2020-09-02 MED ORDER — ATORVASTATIN CALCIUM 40 MG PO TABS: 40.0000 mg | ORAL_TABLET | Freq: Every day | ORAL | 1 refills | Status: DC

## 2020-09-08 ENCOUNTER — Other Ambulatory Visit: Payer: Self-pay

## 2020-09-08 ENCOUNTER — Other Ambulatory Visit: Payer: Medicare Other

## 2020-09-08 DIAGNOSIS — R768 Other specified abnormal immunological findings in serum: Secondary | ICD-10-CM | POA: Diagnosis not present

## 2020-09-13 LAB — HEPATITIS C VRS RNA DETECT BY PCR-QUAL: HCV RNA NAA Qualitative: NEGATIVE

## 2020-09-13 NOTE — Telephone Encounter (Signed)
No return call 

## 2020-11-21 ENCOUNTER — Other Ambulatory Visit: Payer: Self-pay

## 2020-11-21 ENCOUNTER — Encounter: Payer: Self-pay | Admitting: Nurse Practitioner

## 2020-11-21 ENCOUNTER — Ambulatory Visit (INDEPENDENT_AMBULATORY_CARE_PROVIDER_SITE_OTHER): Payer: Medicare Other | Admitting: Nurse Practitioner

## 2020-11-21 VITALS — BP 142/70 | HR 95 | Temp 97.7°F | Ht 61.0 in | Wt 118.0 lb

## 2020-11-21 DIAGNOSIS — E782 Mixed hyperlipidemia: Secondary | ICD-10-CM

## 2020-11-21 DIAGNOSIS — I10 Essential (primary) hypertension: Secondary | ICD-10-CM

## 2020-11-21 DIAGNOSIS — R21 Rash and other nonspecific skin eruption: Secondary | ICD-10-CM | POA: Diagnosis not present

## 2020-11-21 DIAGNOSIS — F339 Major depressive disorder, recurrent, unspecified: Secondary | ICD-10-CM

## 2020-11-21 DIAGNOSIS — Z720 Tobacco use: Secondary | ICD-10-CM | POA: Diagnosis not present

## 2020-11-21 NOTE — Assessment & Plan Note (Signed)
No new signs and symptoms of hypercholesterol.  Completed lipid panel results pending.  Education provided to patient printed handouts given.  Continue low-cholesterol diet.  And exercise.

## 2020-11-21 NOTE — Assessment & Plan Note (Signed)
Blood pressure well controlled on current medication no changes necessary.  Continue low-sodium diet exercise and as tolerated.  Patient is currently walking 5 miles daily.

## 2020-11-21 NOTE — Progress Notes (Signed)
Established Patient Office Visit  Subjective:  Patient ID: [name], female    DOB: May 08, 1941  Age: 80 y.o. MRN: [id number]  CC:  Chief Complaint  Patient presents with   Medical Management of Chronic Issues   Rash   Hypertension   Hyperlipidemia   Depression    HPI [name] presents for follow up of hypertension. Patient was diagnosed in 02/11/2012 the patient is tolerating the medication well without side effects. Compliance with treatment has been good; including taking medication as directed , maintains a healthy diet and regular exercise regimen , and following up as directed.  Current medication amlodipine 10 mg tablet by mouth daily.   Mixed hyperlipidemia  Pt presents with hyperlipidemia. Patient was diagnosed in 07/09/2016. Compliance with treatment has been good; The patient is compliant with medications, maintains a low cholesterol diet , follows up as directed , and maintains an exercise regimen . The patient denies experiencing any hypercholesterolemia related symptoms.  Current medication atorvastatin 40 mg tablet by mouth daily.   GAD 7 : Generalized Anxiety Score 03/22/2020 02/04/2020 12/02/2019 08/26/2019  Nervous, Anxious, on Edge 2 3 0 2  Control/stop worrying 0 2 0 0  Worry too much - different things 0 3 0 0  Trouble relaxing 0 2 0 0  Restless 0 2 0 0  Easily annoyed or irritable 2 2 0 0  Afraid - awful might happen 0 0 0 0  Total GAD 7 Score 4 14 0 2  Anxiety Difficulty - Somewhat difficult Not difficult at all -     [place] Visit from 03/22/2020 in [facility]  PHQ-9 Total Score 0       Past Medical History:  Diagnosis Date   Arthritis    OA   GERD (gastroesophageal reflux disease)    Headache(784.0)    Hepatitis    history of Hepatitis 20 years ago; not sure what kind   History of gout    Hypertension    Hypertensive encephalopathy    Stroke ([place])    SLIGHT RT SIDE WEAKNESS 2001    Past  Surgical History:  Procedure Laterality Date   ABDOMINAL HYSTERECTOMY     ANTERIOR CERVICAL CORPECTOMY  03/07/2012   Procedure: ANTERIOR CERVICAL CORPECTOMY;  Surgeon: [name];  Location: [facility];  Service: Neurosurgery;  Laterality: N/A;  Cervical six-seven, cervical seven-thoracic one Anterior cervical decompression/diskectomy/fusion, with Cervical seven Corpectomy, reconstruction using Allograft and Alphatec plate   CATARACT EXTRACTION W/PHACO Left 04/12/2017   Procedure: CATARACT EXTRACTION PHACO AND INTRAOCULAR LENS PLACEMENT ([place]);  Surgeon: [name];  Location: [facility];  Service: Ophthalmology;  Laterality: Left;  CDE: 3.82   CATARACT EXTRACTION W/PHACO Right 05/03/2017   Procedure: CATARACT EXTRACTION PHACO AND INTRAOCULAR LENS PLACEMENT RIGHT EYE;  Surgeon: [name];  Location: [facility];  Service: Ophthalmology;  Laterality: Right;  CDE: 3.89   MULTIPLE TOOTH EXTRACTIONS     TONSILLECTOMY      Family History  Adopted: Yes  Problem Relation Age of Onset   Bipolar disorder Daughter    Heart disease Daughter    Asthma Daughter    Bipolar disorder Son    Bipolar disorder Daughter    Bipolar disorder Daughter    Bipolar disorder Daughter    Hypertension Daughter    Heart disease Daughter    Drug abuse Daughter        OD    Social History   Socioeconomic History  Marital status: Widowed    Spouse name: Not on file   Number of children: 5   Years of education: Not on file   Highest education level: Not on file  Occupational History   Occupation: retired    Comment: CNA  Tobacco Use   Smoking status: Some Days    Packs/day: 0.50    Years: 50.00    Pack years: 25.00    Types: Cigarettes   Smokeless tobacco: Never  Vaping Use   Vaping Use: Never used  Substance and Sexual Activity   Alcohol use: No   Drug use: No   Sexual activity: Not Currently    Birth control/protection: Surgical  Other Topics Concern   Not on file  Social  History Narrative   Not on file   Social Determinants of Health   Financial Resource Strain: Not on file  Food Insecurity: Not on file  Transportation Needs: Not on file  Physical Activity: Not on file  Stress: Not on file  Social Connections: Not on file  Intimate Partner Violence: Not on file    Outpatient Medications Prior to Visit  Medication Sig Dispense Refill   amLODipine (NORVASC) 10 MG tablet Take 1 tablet (10 mg total) by mouth daily. 90 tablet 3   atorvastatin (LIPITOR) 40 MG tablet Take 1 tablet (40 mg total) by mouth daily. 90 tablet 1   budesonide-formoterol (SYMBICORT) 160-4.5 MCG/ACT inhaler Inhale 2 puffs into the lungs 2 (two) times daily. 1 each 3   clopidogrel (PLAVIX) 75 MG tablet Take 1 tablet (75 mg total) by mouth daily. 90 tablet 3   meloxicam (MOBIC) 15 MG tablet Take 1 tablet (15 mg total) by mouth daily. 60 tablet 1   nicotine (NICODERM CQ - DOSED IN MG/24 HOURS) 14 mg/24hr patch Place 1 patch (14 mg total) onto the skin daily. 28 patch 6   PROAIR HFA 108 (90 Base) MCG/ACT inhaler Inhale 2 puffs into the lungs every 6 (six) hours as needed for wheezing or shortness of breath (cough). 18 g 3   No facility-administered medications prior to visit.    Allergies  Allergen Reactions   Penicillins Anaphylaxis, Swelling, Rash and Other (See Comments)    Has patient had a PCN reaction causing immediate rash, facial/tongue/throat swelling, SOB or lightheadedness with hypotension: yes Has patient had a PCN reaction causing severe rash involving mucus membranes or skin necrosis: no Has patient had a PCN reaction that required hospitalization: yes Has patient had a PCN reaction occurring within the last 10 years: no If all of the above answers are "NO", then may proceed with Cephalosporin use.     ROS Review of Systems  Constitutional: Negative.   HENT: Negative.    Respiratory: Negative.    Genitourinary: Negative.   Skin:  Positive for rash.   Neurological: Negative.   All other systems reviewed and are negative.    Objective:    Physical Exam Vitals and nursing note reviewed.  Constitutional:      Appearance: Normal appearance.  HENT:     Head: Normocephalic.     Nose: Nose normal.  Eyes:     Conjunctiva/sclera: Conjunctivae normal.  Cardiovascular:     Rate and Rhythm: Normal rate and regular rhythm.     Pulses: Normal pulses.     Heart sounds: Normal heart sounds.  Pulmonary:     Effort: Pulmonary effort is normal.     Breath sounds: Normal breath sounds.  Abdominal:     General: Bowel  sounds are normal.  Musculoskeletal:        General: Normal range of motion.  Skin:    General: Skin is dry.     Findings: Rash present.  Neurological:     Mental Status: She is alert.  Psychiatric:        Behavior: Behavior normal.    BP (!) 142/70   Pulse 95   Temp 97.7 F (36.5 C) (Temporal)   Ht 5' 1"  (1.549 m)   Wt 118 lb (53.5 kg)   SpO2 98%   BMI 22.30 kg/m  Wt Readings from Last 3 Encounters:  11/21/20 118 lb (53.5 kg)  08/19/20 122 lb (55.3 kg)  03/22/20 125 lb (56.7 kg)     Health Maintenance Due  Topic Date Due   Zoster Vaccines- Shingrix (1 of 2) Never done   COVID-19 Vaccine (4 - Booster for Moderna series) 07/24/2020    There are no preventive care reminders to display for this patient.  Lab Results  Component Value Date   TSH 2.830 04/21/2019   Lab Results  Component Value Date   WBC 5.0 08/19/2020   HGB 14.5 08/19/2020   HCT 43.4 08/19/2020   MCV 92 08/19/2020   PLT 131 (L) 08/19/2020   Lab Results  Component Value Date   NA 145 (H) 08/19/2020   K 3.9 08/19/2020   CO2 23 08/19/2020   GLUCOSE 96 08/19/2020   BUN 13 08/19/2020   CREATININE 1.09 (H) 08/19/2020   BILITOT 0.4 08/19/2020   ALKPHOS 47 08/19/2020   AST 16 08/19/2020   ALT 8 08/19/2020   PROT 6.4 08/19/2020   ALBUMIN 4.1 08/19/2020   CALCIUM 9.0 08/19/2020   ANIONGAP 8 01/23/2020   EGFR 52 (L) 08/19/2020    Lab Results  Component Value Date   CHOL 161 08/19/2020   Lab Results  Component Value Date   HDL 79 08/19/2020   Lab Results  Component Value Date   LDLCALC 71 08/19/2020   Lab Results  Component Value Date   TRIG 56 08/19/2020   Lab Results  Component Value Date   CHOLHDL 2.0 08/19/2020   Lab Results  Component Value Date   HGBA1C 5.7 (H) 02/12/2012      Assessment & Plan:   Problem List Items Addressed This Visit       Cardiovascular and Mediastinum   Essential hypertension with goal blood pressure less than 130/80 - Primary    Blood pressure well controlled on current medication no changes necessary.  Continue low-sodium diet exercise and as tolerated.  Patient is currently walking 5 miles daily.       Relevant Orders   CBC with Differential   Comprehensive metabolic panel   Lipid panel     Musculoskeletal and Integument   Rash    Patient is reporting new rash on bilateral upper thigh and back.  On assessment, I provided education that skin will sometimes have these lesions or discoloration due to blood thinner.  Patient verbalized understanding.  Advised patient to increase moisturization of her skin especially on the back.  Patient skin excessively dry.           Other   Hyperlipidemia    No new signs and symptoms of hypercholesterol.  Completed lipid panel results pending.  Education provided to patient printed handouts given.  Continue low-cholesterol diet.  And exercise.       Depression, recurrent ([place])    Patient is not reporting any new signs and symptoms  of depression.       Tobacco abuse    Patient not currently ready to quit smoking.  Reports new stress after hearing a positive results from her labs but will try to start considering quitting smoking in the next 3 months. Education provided to patient with printed handouts given.  Will reevaluate in 3 months.        No orders of the defined types were placed in this  encounter.   Follow-up: Return in about 3 months (around 02/21/2021).    Ivy Lynn, NP

## 2020-11-21 NOTE — Assessment & Plan Note (Signed)
Patient not currently ready to quit smoking.  Reports new stress after hearing a positive results from her labs but will try to start considering quitting smoking in the next 3 months. Education provided to patient with printed handouts given.  Will reevaluate in 3 months.

## 2020-11-21 NOTE — Patient Instructions (Signed)
Tobacco Use Disorder Tobacco use disorder (TUD) occurs when a person craves, seeks, and uses tobacco, regardless of the consequences. This disorder can cause problems with mental and physical health. It can affect your ability to have healthy relationships, and it can keep you from meeting your responsibilities at [facility], or school. Tobacco may be: Smoked as a cigarette or cigar. Inhaled using e-cigarettes. Smoked in a pipe or hookah. Chewed as smokeless tobacco. Inhaled into the nostrils as snuff. Tobacco products contain a dangerous chemical called nicotine, which is very addictive. Nicotine triggers hormones that make the body feel stimulated and works on areas of the brain that make you feel good. These effects can make ithard for people to quit nicotine. Tobacco contains many other unsafe chemicals that can damage almost every organ in the body. Smoking tobacco also puts others in danger due to fire risk andpossible health problems caused by breathing in secondhand smoke. What are the signs or symptoms? Symptoms of TUD may include: Being unable to slow down or stop your tobacco use. Spending an abnormal amount of time getting or using tobacco. Craving tobacco. Cravings may last for up to 6 months after quitting. Tobacco use that: Interferes with your work, school, or home life. Interferes with your personal and social relationships. Makes you give up activities that you once enjoyed or found important. Using tobacco even though you know that it is: Dangerous or bad for your health or someone else's health. Causing problems in your life. Needing more and more of the substance to get the same effect (developing tolerance). Experiencing unpleasant symptoms if you do not use the substance (withdrawal). Withdrawal symptoms may include: Depressed, anxious, or irritable mood. Difficulty concentrating. Increased appetite. Restlessness or trouble sleeping. Using the substance to avoid  withdrawal. How is this diagnosed? This condition may be diagnosed based on: Your current and past tobacco use. Your health care provider may ask questions about how your tobacco use affects your life. A physical exam. You may be diagnosed with TUD if you have at least two symptoms within a73-monthperiod. How is this treated? This condition is treated by stopping tobacco use. Many people are unable to quit on their own and need help. Treatment may include: Nicotine replacement therapy (NRT). NRT provides nicotine without the other harmful chemicals in tobacco. NRT gradually lowers the dosage of nicotine in the body and reduces withdrawal symptoms. NRT is available as: Over-the-counter gums, lozenges, and skin patches. Prescription mouth inhalers and nasal sprays. Medicine that acts on the brain to reduce cravings and withdrawal symptoms. A type of talk therapy that examines your triggers for tobacco use, how to avoid them, and how to cope with cravings (behavioral therapy). Hypnosis. This may help with withdrawal symptoms. Joining a support group for others coping with TUD. The best treatment for TUD is usually a combination of medicine, talk therapy, and support groups. Recovery can be a long process. Many people start using tobacco again after stopping (relapse). If you relapse, it does not mean that treatment will not work. Follow these instructions at home:  Lifestyle Do not use any products that contain nicotine or tobacco, such as cigarettes and e-cigarettes. Avoid things that trigger tobacco use as much as you can. Triggers include people and situations that usually cause you to use tobacco. Avoid drinks that contain caffeine, including coffee. These may worsen some withdrawal symptoms. Find ways to manage stress. Wanting to smoke may cause stress, and stress can make you want to smoke. Relaxation techniques  such as deep breathing, meditation, and yoga may help. Attend support groups  as needed. These groups are an important part of long-term recovery for many people. General instructions Take over-the-counter and prescription medicines only as told by your health care provider. Check with your health care provider before taking any new prescription or over-the-counter medicines. Decide on a friend, family member, or smoking quit-line (such as 1-800-QUIT-NOW in the [place] that you can call or text when you feel the urge to smoke or when you need help coping with cravings. Keep all follow-up visits as told by your health care provider and therapist. This is important. Contact a health care provider if: You are not able to take your medicines as prescribed. Your symptoms get worse, even with treatment. Summary Tobacco use disorder (TUD) occurs when a person craves, seeks, and uses tobacco regardless of the consequences. This condition may be diagnosed based on your current and past tobacco use and a physical exam. Many people are unable to quit on their own and need help. Recovery can be a long process. The most effective treatment for TUD is usually a combination of medicine, talk therapy, and support groups. This information is not intended to replace advice given to you by your health care provider. Make sure you discuss any questions you have with your healthcare provider. Document Revised: 04/05/2020 Document Reviewed: 04/05/2020 Elsevier Patient Education  2022 Else[employer] Inc. Cholesterol Content in Foods Cholesterol is a waxy, fat-like substance that helps to carry fat in the blood. The body needs cholesterol in small amounts, but too much cholesterol can causedamage to the arteries and heart. Most people should eat less than 200 milligrams (mg) of cholesterol a day. Foods with cholesterol  Cholesterol is found in animal-based foods, such as meat, seafood, and dairy. Generally, low-fat dairy and lean meats have less cholesterol than full-fat dairy and fatty meats. The  milligrams of cholesterol per serving (mg per serving) of common cholesterol-containing foods are listed below. Meat and other proteins Egg -- one large whole egg has 186 mg. Veal shank -- 4 oz has 141 mg. Lean ground [name] (93% lean) -- 4 oz has 118 mg. Fat-trimmed lamb loin -- 4 oz has 106 mg. Lean ground beef (90% lean) -- 4 oz has 100 mg. Lobster -- 3.5 oz has 90 mg. Pork loin chops -- 4 oz has 86 mg. Canned salmon -- 3.5 oz has 83 mg. Fat-trimmed beef top loin -- 4 oz has 78 mg. Frankfurter -- 1 frank (3.5 oz) has 77 mg. Crab -- 3.5 oz has 71 mg. Roasted chicken without skin, white meat -- 4 oz has 66 mg. Light bologna -- 2 oz has 45 mg. Deli-cut [name] -- 2 oz has 31 mg. Canned tuna -- 3.5 oz has 31 mg. [name] -- 1 oz has 29 mg. Oysters and mussels (raw) -- 3.5 oz has 25 mg. Mackerel -- 1 oz has 22 mg. Trout -- 1 oz has 20 mg. Pork sausage -- 1 link (1 oz) has 17 mg. Salmon -- 1 oz has 16 mg. Tilapia -- 1 oz has 14 mg. Dairy Soft-serve ice cream --  cup (4 oz) has 103 mg. Whole-milk yogurt -- 1 cup (8 oz) has 29 mg. Cheddar cheese -- 1 oz has 28 mg. American cheese -- 1 oz has 28 mg. Whole milk -- 1 cup (8 oz) has 23 mg. 2% milk -- 1 cup (8 oz) has 18 mg. Cream cheese -- 1 tablespoon (Tbsp) has 15 mg. [employer]  cheese --  cup (4 oz) has 14 mg. Low-fat (1%) milk -- 1 cup (8 oz) has 10 mg. Sour cream -- 1 Tbsp has 8.5 mg. Low-fat yogurt -- 1 cup (8 oz) has 8 mg. Nonfat Greek yogurt -- 1 cup (8 oz) has 7 mg. Half-and-half cream -- 1 Tbsp has 5 mg. Fats and oils Cod liver oil -- 1 tablespoon (Tbsp) has 82 mg. Butter -- 1 Tbsp has 15 mg. Lard -- 1 Tbsp has 14 mg. Bacon grease -- 1 Tbsp has 14 mg. Mayonnaise -- 1 Tbsp has 5-10 mg. Margarine -- 1 Tbsp has 3-10 mg. Exact amounts of cholesterol in these foods may vary depending on specificingredients and brands. Foods without cholesterol Most plant-based foods do not have cholesterol unless you combine them with a food  that has cholesterol. Foods without cholesterol include: Grains and cereals. Vegetables. Fruits. Vegetable oils, such as olive, canola, and sunflower oil. Legumes, such as peas, beans, and lentils. Nuts and seeds. Egg whites. Summary The body needs cholesterol in small amounts, but too much cholesterol can cause damage to the arteries and heart. Most people should eat less than 200 milligrams (mg) of cholesterol a day. This information is not intended to replace advice given to you by your health care provider. Make sure you discuss any questions you have with your healthcare provider. Document Revised: 08/25/2019 Document Reviewed: 10/05/2019 Elsevier Patient Education  2022 Elsevier Inc. Hypertension, Adult Hypertension is another name for high blood pressure. High blood pressure forces your heart to work harder to pump blood. This can cause problems overtime. There are two numbers in a blood pressure reading. There is a top number (systolic) over a bottom number (diastolic). It is best to have a blood pressure that is below 120/80. Healthy choicescan help lower your blood pressure, or you may need medicine to help lower it. What are the causes? The cause of this condition is not known. Some conditions may be related tohigh blood pressure. What increases the risk? Smoking. Having type 2 diabetes mellitus, high cholesterol, or both. Not getting enough exercise or physical activity. Being overweight. Having too much fat, sugar, calories, or salt (sodium) in your diet. Drinking too much alcohol. Having long-term (chronic) kidney disease. Having a family history of high blood pressure. Age. Risk increases with age. Race. You may be at higher risk if you are African American. Gender. Men are at higher risk than women before age 22. After age 33, women are at higher risk than men. Having obstructive sleep apnea. Stress. What are the signs or symptoms? High blood pressure may not cause  symptoms. Very high blood pressure (hypertensive crisis) may cause: Headache. Feelings of worry or nervousness (anxiety). Shortness of breath. Nosebleed. A feeling of being sick to your stomach (nausea). Throwing up (vomiting). Changes in how you see. Very bad chest pain. Seizures. How is this treated? This condition is treated by making healthy lifestyle changes, such as: Eating healthy foods. Exercising more. Drinking less alcohol. Your health care provider may prescribe medicine if lifestyle changes are not enough to get your blood pressure under control, and if: Your top number is above 130. Your bottom number is above 80. Your personal target blood pressure may vary. Follow these instructions at home: Eating and drinking  If told, follow the DASH eating plan. To follow this plan: Fill one half of your plate at each meal with fruits and vegetables. Fill one fourth of your plate at each meal with whole grains. Whole  grains include whole-wheat pasta, brown rice, and whole-grain bread. Eat or drink low-fat dairy products, such as skim milk or low-fat yogurt. Fill one fourth of your plate at each meal with low-fat (lean) proteins. Low-fat proteins include fish, chicken without skin, eggs, beans, and tofu. Avoid fatty meat, cured and processed meat, or chicken with skin. Avoid pre-made or processed food. Eat less than 1,500 mg of salt each day. Do not drink alcohol if: Your doctor tells you not to drink. You are pregnant, may be pregnant, or are planning to become pregnant. If you drink alcohol: Limit how much you use to: 0-1 drink a day for women. 0-2 drinks a day for men. Be aware of how much alcohol is in your drink. In the [place] one drink equals one 12 oz bottle of beer (355 mL), one 5 oz glass of wine (148 mL), or one 1 oz glass of hard liquor (44 mL).  Lifestyle  Work with your doctor to stay at a healthy weight or to lose weight. Ask your doctor what the best weight is  for you. Get at least 30 minutes of exercise most days of the week. This may include walking, swimming, or biking. Get at least 30 minutes of exercise that strengthens your muscles (resistance exercise) at least 3 days a week. This may include lifting weights or doing Pilates. Do not use any products that contain nicotine or tobacco, such as cigarettes, e-cigarettes, and chewing tobacco. If you need help quitting, ask your doctor. Check your blood pressure at home as told by your doctor. Keep all follow-up visits as told by your doctor. This is important.  Medicines Take over-the-counter and prescription medicines only as told by your doctor. Follow directions carefully. Do not skip doses of blood pressure medicine. The medicine does not work as well if you skip doses. Skipping doses also puts you at risk for problems. Ask your doctor about side effects or reactions to medicines that you should watch for. Contact a doctor if you: Think you are having a reaction to the medicine you are taking. Have headaches that keep coming back (recurring). Feel dizzy. Have swelling in your ankles. Have trouble with your vision. Get help right away if you: Get a very bad headache. Start to feel mixed up (confused). Feel weak or numb. Feel faint. Have very bad pain in your: Chest. Belly (abdomen). Throw up more than once. Have trouble breathing. Summary Hypertension is another name for high blood pressure. High blood pressure forces your heart to work harder to pump blood. For most people, a normal blood pressure is less than 120/80. Making healthy choices can help lower blood pressure. If your blood pressure does not get lower with healthy choices, you may need to take medicine. This information is not intended to replace advice given to you by your health care provider. Make sure you discuss any questions you have with your healthcare provider. Document Revised: 01/22/2018 Document Reviewed:  01/22/2018 Elsevier Patient Education  2022 [employer].

## 2020-11-21 NOTE — Assessment & Plan Note (Signed)
Patient is reporting new rash on bilateral upper thigh and back.  On assessment, I provided education that skin will sometimes have these lesions or discoloration due to blood thinner.  Patient verbalized understanding.  Advised patient to increase moisturization of her skin especially on the back.  Patient skin excessively dry.

## 2020-11-21 NOTE — Assessment & Plan Note (Signed)
Patient is not reporting any new signs and symptoms of depression.

## 2020-11-22 LAB — COMPREHENSIVE METABOLIC PANEL WITH GFR
ALT: 12 [IU]/L (ref 0–32)
AST: 19 [IU]/L (ref 0–40)
Albumin/Globulin Ratio: 1.8 (ref 1.2–2.2)
Albumin: 4.2 g/dL (ref 3.7–4.7)
Alkaline Phosphatase: 60 [IU]/L (ref 44–121)
BUN/Creatinine Ratio: 14 (ref 12–28)
BUN: 14 mg/dL (ref 8–27)
Bilirubin Total: 0.5 mg/dL (ref 0.0–1.2)
CO2: 25 mmol/L (ref 20–29)
Calcium: 9.4 mg/dL (ref 8.7–10.3)
Chloride: 104 mmol/L (ref 96–106)
Creatinine, Ser: 1.02 mg/dL — ABNORMAL HIGH (ref 0.57–1.00)
Globulin, Total: 2.4 g/dL (ref 1.5–4.5)
Glucose: 93 mg/dL (ref 65–99)
Potassium: 4.4 mmol/L (ref 3.5–5.2)
Sodium: 146 mmol/L — ABNORMAL HIGH (ref 134–144)
Total Protein: 6.6 g/dL (ref 6.0–8.5)
eGFR: 56 mL/min/{1.73_m2} — ABNORMAL LOW

## 2020-11-22 LAB — LIPID PANEL
Chol/HDL Ratio: 2.2 ratio (ref 0.0–4.4)
Cholesterol, Total: 157 mg/dL (ref 100–199)
HDL: 72 mg/dL (ref >39)
LDL Chol Calc (NIH): 74 mg/dL (ref 0–99)
Triglycerides: 54 mg/dL (ref 0–149)
VLDL Cholesterol Cal: 11 mg/dL (ref 5–40)

## 2020-11-22 LAB — CBC WITH DIFFERENTIAL/PLATELET
Basophils Absolute: 0.1 10*3/uL (ref 0.0–0.2)
Basos: 1 %
EOS (ABSOLUTE): 0.2 10*3/uL (ref 0.0–0.4)
Eos: 3 %
Hematocrit: 47 % — ABNORMAL HIGH (ref 34.0–46.6)
Hemoglobin: 15.1 g/dL (ref 11.1–15.9)
Immature Grans (Abs): 0 10*3/uL (ref 0.0–0.1)
Immature Granulocytes: 0 %
Lymphocytes Absolute: 1.7 10*3/uL (ref 0.7–3.1)
Lymphs: 29 %
MCH: 29.2 pg (ref 26.6–33.0)
MCHC: 32.1 g/dL (ref 31.5–35.7)
MCV: 91 fL (ref 79–97)
Monocytes Absolute: 0.5 10*3/uL (ref 0.1–0.9)
Monocytes: 8 %
Neutrophils Absolute: 3.4 10*3/uL (ref 1.4–7.0)
Neutrophils: 59 %
Platelets: 169 10*3/uL (ref 150–450)
RBC: 5.17 x10E6/uL (ref 3.77–5.28)
RDW: 12.6 % (ref 11.7–15.4)
WBC: 5.8 10*3/uL (ref 3.4–10.8)

## 2021-01-12 ENCOUNTER — Other Ambulatory Visit: Payer: Self-pay | Admitting: Family Medicine

## 2021-01-12 DIAGNOSIS — J449 Chronic obstructive pulmonary disease, unspecified: Secondary | ICD-10-CM

## 2021-01-27 ENCOUNTER — Ambulatory Visit (INDEPENDENT_AMBULATORY_CARE_PROVIDER_SITE_OTHER): Payer: Medicare Other

## 2021-01-27 VITALS — Ht 61.0 in | Wt 118.0 lb

## 2021-01-27 DIAGNOSIS — Z1231 Encounter for screening mammogram for malignant neoplasm of breast: Secondary | ICD-10-CM | POA: Diagnosis not present

## 2021-01-27 DIAGNOSIS — Z0000 Encounter for general adult medical examination without abnormal findings: Secondary | ICD-10-CM | POA: Diagnosis not present

## 2021-01-27 NOTE — Patient Instructions (Signed)
[name] , Thank you for taking time to come for your Medicare Wellness Visit. I appreciate your ongoing commitment to your health goals. Please review the following plan we discussed and let me know if I can assist you in the future.   Screening recommendations/referrals: Colonoscopy: No longer required Mammogram: Due, order placed to schedule Bone Density: Done 08/16/19, repeat in 2 years Recommended yearly ophthalmology/optometry visit for glaucoma screening and checkup Recommended yearly dental visit for hygiene and checkup  Vaccinations: Influenza vaccine: 02/24/20, repeat in fall Pneumococcal vaccine: 07/09/16 11/24/14 Tdap vaccine: 11/24/2014 Shingles vaccine: Shingrix discussed. Please contact your pharmacy for coverage information.     Covid-19:03/23/20 08/21/19 07/23/19  Advanced directives: Advance directive discussed with you today. Even though you declined this today, please call our office should you change your mind, and we can give you the proper paperwork for you to fill out.   Conditions/risks identified: The patient is asked to make an attempt to improve diet and exercise patterns to aid in medical management of this problem.   Next appointment: Follow up in one year for your annual wellness visit    Preventive Care 65 Years and Older, Female Preventive care refers to lifestyle choices and visits with your health care provider that can promote health and wellness. What does preventive care include? A yearly physical exam. This is also called an annual well check. Dental exams once or twice a year. Routine eye exams. Ask your health care provider how often you should have your eyes checked. Personal lifestyle choices, including: Daily care of your teeth and gums. Regular physical activity. Eating a healthy diet. Avoiding tobacco and drug use. Limiting alcohol use. Practicing safe sex. Taking low-dose aspirin every day. Taking vitamin and mineral supplements as  recommended by your health care provider. What happens during an annual well check? The services and screenings done by your health care provider during your annual well check will depend on your age, overall health, lifestyle risk factors, and family history of disease. Counseling  Your health care provider may ask you questions about your: Alcohol use. Tobacco use. Drug use. Emotional well-being. Home and relationship well-being. Sexual activity. Eating habits. History of falls. Memory and ability to understand (cognition). Work and work Astronomer. Reproductive health. Screening  You may have the following tests or measurements: Height, weight, and BMI. Blood pressure. Lipid and cholesterol levels. These may be checked every 5 years, or more frequently if you are over 97 years old. Skin check. Lung cancer screening. You may have this screening every year starting at age 61 if you have a 30-pack-year history of smoking and currently smoke or have quit within the past 15 years. Fecal occult blood test (FOBT) of the stool. You may have this test every year starting at age 51. Flexible sigmoidoscopy or colonoscopy. You may have a sigmoidoscopy every 5 years or a colonoscopy every 10 years starting at age 78. Hepatitis C blood test. Hepatitis B blood test. Sexually transmitted disease (STD) testing. Diabetes screening. This is done by checking your blood sugar (glucose) after you have not eaten for a while (fasting). You may have this done every 1-3 years. Bone density scan. This is done to screen for osteoporosis. You may have this done starting at age 73. Mammogram. This may be done every 1-2 years. Talk to your health care provider about how often you should have regular mammograms. Talk with your health care provider about your test results, treatment options, and if necessary, the need  for more tests. Vaccines  Your health care provider may recommend certain vaccines, such  as: Influenza vaccine. This is recommended every year. Tetanus, diphtheria, and acellular pertussis (Tdap, Td) vaccine. You may need a Td booster every 10 years. Zoster vaccine. You may need this after age 82. Pneumococcal 13-valent conjugate (PCV13) vaccine. One dose is recommended after age 43. Pneumococcal polysaccharide (PPSV23) vaccine. One dose is recommended after age 94. Talk to your health care provider about which screenings and vaccines you need and how often you need them. This information is not intended to replace advice given to you by your health care provider. Make sure you discuss any questions you have with your health care provider. Document Released: 06/10/2015 Document Revised: 02/01/2016 Document Reviewed: 03/15/2015 Elsevier Interactive Patient Education  2017 [place] Prevention in the Home Falls can cause injuries. They can happen to people of all ages. There are many things you can do to make your home safe and to help prevent falls. What can I do on the outside of my home? Regularly fix the edges of walkways and driveways and fix any cracks. Remove anything that might make you trip as you walk through a door, such as a raised step or threshold. Trim any bushes or trees on the path to your home. Use bright outdoor lighting. Clear any walking paths of anything that might make someone trip, such as rocks or tools. Regularly check to see if handrails are loose or broken. Make sure that both sides of any steps have handrails. Any raised decks and porches should have guardrails on the edges. Have any leaves, snow, or ice cleared regularly. Use sand or salt on walking paths during winter. Clean up any spills in your garage right away. This includes oil or grease spills. What can I do in the bathroom? Use night lights. Install grab bars by the toilet and in the tub and shower. Do not use towel bars as grab bars. Use non-skid mats or decals in the tub or  shower. If you need to sit down in the shower, use a plastic, non-slip stool. Keep the floor dry. Clean up any water that spills on the floor as soon as it happens. Remove soap buildup in the tub or shower regularly. Attach bath mats securely with double-sided non-slip rug tape. Do not have throw rugs and other things on the floor that can make you trip. What can I do in the bedroom? Use night lights. Make sure that you have a light by your bed that is easy to reach. Do not use any sheets or blankets that are too big for your bed. They should not hang down onto the floor. Have a firm chair that has side arms. You can use this for support while you get dressed. Do not have throw rugs and other things on the floor that can make you trip. What can I do in the kitchen? Clean up any spills right away. Avoid walking on wet floors. Keep items that you use a lot in easy-to-reach places. If you need to reach something above you, use a strong step stool that has a grab bar. Keep electrical cords out of the way. Do not use floor polish or wax that makes floors slippery. If you must use wax, use non-skid floor wax. Do not have throw rugs and other things on the floor that can make you trip. What can I do with my stairs? Do not leave any items on the stairs.  Make sure that there are handrails on both sides of the stairs and use them. Fix handrails that are broken or loose. Make sure that handrails are as long as the stairways. Check any carpeting to make sure that it is firmly attached to the stairs. Fix any carpet that is loose or worn. Avoid having throw rugs at the top or bottom of the stairs. If you do have throw rugs, attach them to the floor with carpet tape. Make sure that you have a light switch at the top of the stairs and the bottom of the stairs. If you do not have them, ask someone to add them for you. What else can I do to help prevent falls? Wear shoes that: Do not have high heels. Have  rubber bottoms. Are comfortable and fit you well. Are closed at the toe. Do not wear sandals. If you use a stepladder: Make sure that it is fully opened. Do not climb a closed stepladder. Make sure that both sides of the stepladder are locked into place. Ask someone to hold it for you, if possible. Clearly mark and make sure that you can see: Any grab bars or handrails. First and last steps. Where the edge of each step is. Use tools that help you move around (mobility aids) if they are needed. These include: Canes. Walkers. Scooters. Crutches. Turn on the lights when you go into a dark area. Replace any light bulbs as soon as they burn out. Set up your furniture so you have a clear path. Avoid moving your furniture around. If any of your floors are uneven, fix them. If there are any pets around you, be aware of where they are. Review your medicines with your doctor. Some medicines can make you feel dizzy. This can increase your chance of falling. Ask your doctor what other things that you can do to help prevent falls. This information is not intended to replace advice given to you by your health care provider. Make sure you discuss any questions you have with your health care provider. Document Released: 03/10/2009 Document Revised: 10/20/2015 Document Reviewed: 06/18/2014 Elsevier Interactive Patient Education  2017 [employer].

## 2021-01-27 NOTE — Progress Notes (Signed)
Subjective:   [name] is a 80 y.o. female who presents for Medicare Annual (Subsequent) preventive examination. Virtual Visit via Telephone Note  I connected with  [name] on 01/27/21 at  2:45 PM EDT by telephone and verified that I am speaking with the correct person using two identifiers.  Location: Patient: Home Provider: [facility] Persons participating in the virtual visit: patient/Nurse Health Advisor   I discussed the limitations, risks, security and privacy concerns of performing an evaluation and management service by telephone and the availability of in person appointments. The patient expressed understanding and agreed to proceed.  Interactive audio and video telecommunications were attempted between this nurse and patient, however failed, due to patient having technical difficulties OR patient did not have access to video capability.  We continued and completed visit with audio only.  Some vital signs may be absent or patient reported.   Darral Dash, LPN  Review of Systems     Cardiac Risk Factors include: advanced age (>39men, >23 women);dyslipidemia;hypertension     Objective:    Today's Vitals   01/27/21 1435  Weight: 118 lb (53.5 kg)  Height: 5\' 1"  (1.549 m)   Body mass index is 22.3 kg/m.  Advanced Directives 01/27/2021 01/27/2020 01/22/2020 01/22/2020 01/26/2019 04/12/2017 04/10/2017  Does Patient Have a Medical Advance Directive? No No Yes Yes No No No  Type of Advance Directive - 04/12/2017 of Librarian, academic Power of [place];Living will - - -  Does patient want to make changes to medical advance directive? - No - Patient declined No - Patient declined No - Guardian declined - - -  Copy of Healthcare Power of Attorney in Chart? - - No - copy requested No - copy requested - - -  Would patient like information on creating a medical advance directive? No - Patient declined No - Patient declined - - No - Patient declined - No -  Patient declined    Current Medications (verified) Outpatient Encounter Medications as of 01/27/2021  Medication Sig   amLODipine (NORVASC) 10 MG tablet Take 1 tablet (10 mg total) by mouth daily.   atorvastatin (LIPITOR) 40 MG tablet Take 1 tablet (40 mg total) by mouth daily.   clopidogrel (PLAVIX) 75 MG tablet Take 1 tablet (75 mg total) by mouth daily.   meloxicam (MOBIC) 15 MG tablet Take 1 tablet (15 mg total) by mouth daily.   PROAIR HFA 108 (90 Base) MCG/ACT inhaler Inhale 2 puffs into the lungs every 6 (six) hours as needed for wheezing or shortness of breath (cough).   SYMBICORT 160-4.5 MCG/ACT inhaler INHALE 2 PUFFS INTO THE LUNGS 2 TIMES A DAY   nicotine (NICODERM CQ - DOSED IN MG/24 HOURS) 14 mg/24hr patch Place 1 patch (14 mg total) onto the skin daily. (Patient not taking: Reported on 01/27/2021)   No facility-administered encounter medications on file as of 01/27/2021.    Allergies (verified) Penicillins   History: Past Medical History:  Diagnosis Date   Arthritis    OA   GERD (gastroesophageal reflux disease)    Headache(784.0)    Hepatitis    history of Hepatitis 20 years ago; not sure what kind   History of gout    Hypertension    Hypertensive encephalopathy    Stroke (HCC)    SLIGHT RT SIDE WEAKNESS 2001   Past Surgical History:  Procedure Laterality Date   ABDOMINAL HYSTERECTOMY     ANTERIOR CERVICAL CORPECTOMY  03/07/2012   Procedure:  ANTERIOR CERVICAL CORPECTOMY;  Surgeon: Barnett AbuHenry Elsner, MD;  Location: [facility];  Service: Neurosurgery;  Laterality: N/A;  Cervical six-seven, cervical seven-thoracic one Anterior cervical decompression/diskectomy/fusion, with Cervical seven Corpectomy, reconstruction using Allograft and Alphatec plate   CATARACT EXTRACTION W/PHACO Left 04/12/2017   Procedure: CATARACT EXTRACTION PHACO AND INTRAOCULAR LENS PLACEMENT (IOC);  Surgeon: Fabio PierceWrzosek, James, MD;  Location: [facility];  Service: Ophthalmology;  Laterality: Left;  CDE: 3.82    CATARACT EXTRACTION W/PHACO Right 05/03/2017   Procedure: CATARACT EXTRACTION PHACO AND INTRAOCULAR LENS PLACEMENT RIGHT EYE;  Surgeon: Fabio PierceWrzosek, James, MD;  Location: [facility];  Service: Ophthalmology;  Laterality: Right;  CDE: 3.89   MULTIPLE TOOTH EXTRACTIONS     TONSILLECTOMY     Family History  Adopted: Yes  Problem Relation Age of Onset   Bipolar disorder Daughter    Heart disease Daughter    Asthma Daughter    Bipolar disorder Son    Bipolar disorder Daughter    Bipolar disorder Daughter    Bipolar disorder Daughter    Hypertension Daughter    Heart disease Daughter    Drug abuse Daughter        OD   Social History   Socioeconomic History   Marital status: Widowed    Spouse name: Not on file   Number of children: 5   Years of education: Not on file   Highest education level: Not on file  Occupational History   Occupation: retired    Comment: CNA  Tobacco Use   Smoking status: Some Days    Packs/day: 0.50    Years: 50.00    Pack years: 25.00    Types: Cigarettes   Smokeless tobacco: Never  Vaping Use   Vaping Use: Never used  Substance and Sexual Activity   Alcohol use: No   Drug use: No   Sexual activity: Not Currently    Birth control/protection: Surgical  Other Topics Concern   Not on file  Social History Narrative   5 children living, 1 deceased.    Children live out of state.   Social Determinants of Health   Financial Resource Strain: Low Risk    Difficulty of Paying Living Expenses: Not very hard  Food Insecurity: Food Insecurity Present   Worried About Running Out of Food in the Last Year: Sometimes true   Ran Out of Food in the Last Year: Sometimes true  Transportation Needs: No Transportation Needs   Lack of Transportation (Medical): No   Lack of Transportation (Non-Medical): No  Physical Activity: Sufficiently Active   Days of Exercise per Week: 5 days   Minutes of Exercise per Session: 40 min  Stress: No Stress Concern Present    Feeling of Stress : Only a little  Social Connections: Moderately Integrated   Frequency of Communication with Friends and Family: More than three times a week   Frequency of Social Gatherings with Friends and Family: More than three times a week   Attends Religious Services: More than 4 times per year   Active Member of [employer] or Organizations: Yes   Attends [employer] or Organization Meetings: More than 4 times per year   Marital Status: Widowed    Tobacco Counseling Ready to quit: Not Answered Counseling given: Not Answered   Clinical Intake:  Pre-visit preparation completed: Yes  Pain : No/denies pain     BMI - recorded: 22.3 Nutritional Status: BMI of 19-24  Normal Nutritional Risks: None Diabetes: No  How often do you need to  have someone help you when you read instructions, pamphlets, or other written materials from your doctor or pharmacy?: 1 - Never  Diabetic?No  Interpreter Needed?: No  Information entered by :: Venia Carbon, LPN   Activities of Daily Living In your present state of health, do you have any difficulty performing the following activities: 01/27/2021  Hearing? N  Vision? N  Difficulty concentrating or making decisions? N  Walking or climbing stairs? N  Dressing or bathing? N  Doing errands, shopping? N  Preparing Food and eating ? N  Using the Toilet? N  In the past six months, have you accidently leaked urine? Y  Comment Pt states she wears Depends because she does leak urine at times.  Do you have problems with loss of bowel control? N  Managing your Medications? N  Managing your Finances? N  Housekeeping or managing your Housekeeping? N  Some recent data might be hidden    Patient Care Team: Daryll Drown, NP as PCP - General (Nurse Practitioner)  Indicate any recent Medical Services you may have received from other than [facility] providers in the past year (date may be approximate).     Assessment:   This is a routine wellness  examination for [name].  Hearing/Vision screen Hearing Screening - Comments:: No hearing issues. Vision Screening - Comments:: Glasses. 11/2020 last eye exam. [facility].  Dietary issues and exercise activities discussed: Current Exercise Habits: Home exercise routine, Type of exercise: walking, Time (Minutes): 45, Frequency (Times/Week): 5, Weekly Exercise (Minutes/Week): 225, Intensity: Mild, Exercise limited by: cardiac condition(s);respiratory conditions(s)   Goals Addressed             This Visit's Progress    DIET - EAT MORE FRUITS AND VEGETABLES   On track    Exercise 150 min/wk Moderate Activity   On track    Pt states she walks 3 miles per day.       Depression Screen PHQ 2/9 Scores 01/27/2021 11/21/2020 03/22/2020 02/04/2020 12/02/2019 08/26/2019 07/24/2019  PHQ - 2 Score 0 0 0 2 0 0 2  PHQ- 9 Score - - 0 10 - 6 13    Fall Risk Fall Risk  01/27/2021 11/21/2020 03/22/2020 02/04/2020 01/27/2020  Falls in the past year? 0 0 0 0 1  Number falls in past yr: 0 - - - 0  Injury with Fall? 0 - - - 0  Risk for fall due to : No Fall Risks - - - -  Follow up Falls prevention discussed - - - -    FALL RISK PREVENTION PERTAINING TO THE HOME:  Any stairs in or around the home? Y  If so, are there any without handrails? No  Home free of loose throw rugs in walkways, pet beds, electrical cords, etc? Yes  Adequate lighting in your home to reduce risk of falls? Yes   ASSISTIVE DEVICES UTILIZED TO PREVENT FALLS:  Life alert? Yes  Use of a cane, walker or w/c? No  Grab bars in the bathroom? No  Shower chair or bench in shower? No  Elevated toilet seat or a handicapped toilet? No   TIMED UP AND GO:  Was the test performed? No Phone visit.    Cognitive Function:      6CIT Screen 01/27/2021 01/27/2020 01/26/2019  What Year? 0 points 0 points 0 points  What month? 0 points 0 points 0 points  What time? 0 points 0 points 0 points  Count back from 20 0 points 0 points  0 points  Months in  reverse 4 points 0 points 2 points  Repeat phrase 0 points 0 points 0 points  Total Score 4 0 2    Immunizations Immunization History  Administered Date(s) Administered   Fluad Quad(high Dose 65+) 02/26/2016, 02/25/2017, 02/26/2018, 02/24/2020   Influenza Split 02/13/2012   Influenza, High Dose Seasonal PF 02/19/2019   Influenza-Unspecified 03/28/2013   Moderna Sars-Covid-2 Vaccination 07/23/2019, 08/21/2019, 03/23/2020   Pneumococcal Conjugate-13 11/24/2014   Pneumococcal Polysaccharide-23 07/09/2016   Pneumococcal-Unspecified 07/09/2016   Tdap 11/24/2014    TDAP status: Up to date  Flu Vaccine status: Due, Education has been provided regarding the importance of this vaccine. Advised may receive this vaccine at local pharmacy or Health Dept. Aware to provide a copy of the vaccination record if obtained from local pharmacy or Health Dept. Verbalized acceptance and understanding.  Pneumococcal vaccine status: Up to date  Covid-19 vaccine status: Completed vaccines  Qualifies for Shingles Vaccine? Yes   Zostavax completed No   Shingrix Completed?: No.    Education has been provided regarding the importance of this vaccine. Patient has been advised to call insurance company to determine out of pocket expense if they have not yet received this vaccine. Advised may also receive vaccine at local pharmacy or Health Dept. Verbalized acceptance and understanding.  Screening Tests Health Maintenance  Topic Date Due   Zoster Vaccines- Shingrix (1 of 2) Never done   COVID-19 Vaccine (4 - Booster for Moderna series) 07/24/2020   INFLUENZA VACCINE  12/26/2020   DEXA SCAN  07/27/2021   TETANUS/TDAP  11/23/2024   Hepatitis C Screening  Completed   PNA vac Low Risk Adult  Completed   HPV VACCINES  Aged Out    Health Maintenance  Health Maintenance Due  Topic Date Due   Zoster Vaccines- Shingrix (1 of 2) Never done   COVID-19 Vaccine (4 - Booster for Moderna series) 07/24/2020    INFLUENZA VACCINE  12/26/2020    Colorectal cancer screening: No longer required.   Mammogram status: Ordered 01/27/21. Pt provided with contact info and advised to call to schedule appt.   Bone Density status: Completed 08/26/19. Results reflect: Bone density results: OSTEOPENIA. Repeat every 2 years.  Lung Cancer Screening: (Low Dose CT Chest recommended if Age 49-80 years, 30 pack-year currently smoking OR have quit w/in 15years.) does not qualify.     Additional Screening:  Hepatitis C Screening: does not qualify; Completed 08/19/20  Vision Screening: Recommended annual ophthalmology exams for early detection of glaucoma and other disorders of the eye. Is the patient up to date with their annual eye exam?  Yes  Who is the provider or what is the name of the office in which the patient attends annual eye exams? [facility] If pt is not established with a provider, would they like to be referred to a provider to establish care? No .   Dental Screening: Recommended annual dental exams for proper oral hygiene  Community Resource Referral / Chronic Care Management: CRR required this visit?  No   CCM required this visit?  No      Plan:     I have personally reviewed and noted the following in the patient's chart:   Medical and social history Use of alcohol, tobacco or illicit drugs  Current medications and supplements including opioid prescriptions.  Functional ability and status Nutritional status Physical activity Advanced directives List of other physicians Hospitalizations, surgeries, and ER visits in previous 12 months Vitals Screenings to  include cognitive, depression, and falls Referrals and appointments  In addition, I have reviewed and discussed with patient certain preventive protocols, quality metrics, and best practice recommendations. A written personalized care plan for preventive services as well as general preventive health recommendations were provided  to patient.     Darral Dash, LPN   06/02/1094   Nurse Notes: Pt would like mammogram ordered. Order placed per protocol.

## 2021-02-14 ENCOUNTER — Telehealth: Payer: Self-pay | Admitting: Nurse Practitioner

## 2021-02-14 ENCOUNTER — Other Ambulatory Visit: Payer: Self-pay | Admitting: Nurse Practitioner

## 2021-02-14 DIAGNOSIS — J449 Chronic obstructive pulmonary disease, unspecified: Secondary | ICD-10-CM

## 2021-02-14 NOTE — Telephone Encounter (Signed)
Pt says she uses vicks nasal spray and wants to know how long she should wait to take home covid test while using the spray? Says she doesn't want the medicine to interfere with the test.  Please advise and call patient.

## 2021-02-16 NOTE — Telephone Encounter (Signed)
Patient aware and verbalized understanding. °

## 2021-02-21 ENCOUNTER — Other Ambulatory Visit: Payer: Self-pay

## 2021-02-21 ENCOUNTER — Encounter: Payer: Self-pay | Admitting: Nurse Practitioner

## 2021-02-21 ENCOUNTER — Ambulatory Visit (INDEPENDENT_AMBULATORY_CARE_PROVIDER_SITE_OTHER): Payer: Medicare Other | Admitting: Nurse Practitioner

## 2021-02-21 VITALS — BP 136/79 | HR 79 | Temp 97.0°F | Ht 61.0 in | Wt 121.8 lb

## 2021-02-21 DIAGNOSIS — I10 Essential (primary) hypertension: Secondary | ICD-10-CM

## 2021-02-21 DIAGNOSIS — E782 Mixed hyperlipidemia: Secondary | ICD-10-CM

## 2021-02-21 DIAGNOSIS — M549 Dorsalgia, unspecified: Secondary | ICD-10-CM

## 2021-02-21 DIAGNOSIS — M5430 Sciatica, unspecified side: Secondary | ICD-10-CM

## 2021-02-21 DIAGNOSIS — Z23 Encounter for immunization: Secondary | ICD-10-CM | POA: Diagnosis not present

## 2021-02-21 DIAGNOSIS — J449 Chronic obstructive pulmonary disease, unspecified: Secondary | ICD-10-CM | POA: Diagnosis not present

## 2021-02-21 DIAGNOSIS — Z720 Tobacco use: Secondary | ICD-10-CM | POA: Diagnosis not present

## 2021-02-21 HISTORY — DX: Sciatica, unspecified side: M54.30

## 2021-02-21 MED ORDER — ATORVASTATIN CALCIUM 40 MG PO TABS: 40.0000 mg | ORAL_TABLET | Freq: Every day | ORAL | 1 refills | Status: DC

## 2021-02-21 MED ORDER — BUDESONIDE-FORMOTEROL FUMARATE 160-4.5 MCG/ACT IN AERO: 2.0000 | INHALATION_SPRAY | Freq: Two times a day (BID) | RESPIRATORY_TRACT | 4 refills | Status: DC

## 2021-02-21 MED ORDER — CYCLOBENZAPRINE HCL 5 MG PO TABS: 5.0000 mg | ORAL_TABLET | Freq: Three times a day (TID) | ORAL | 1 refills | Status: DC | PRN

## 2021-02-21 MED ORDER — PROAIR HFA 108 (90 BASE) MCG/ACT IN AERS: 2.0000 | INHALATION_SPRAY | Freq: Four times a day (QID) | RESPIRATORY_TRACT | 3 refills | Status: DC | PRN

## 2021-02-21 NOTE — Progress Notes (Signed)
Established Patient Office Visit  Subjective:  Patient ID: [name], female    DOB: Jul 24, 1940  Age: 80 y.o. MRN: [id number]  CC:  Chief Complaint  Patient presents with   Hyperlipidemia   Hypertension    3 month follow up     HPI [name] presents for follow up of hypertension. Patient was diagnosed in 02/11/2012. The patient is tolerating the medication well without side effects. Compliance with treatment has been good; including taking medication as directed , maintains a healthy diet and regular exercise regimen , and following up as directed.    Mixed hyperlipidemia  Pt presents with hyperlipidemia. Patient was diagnosed in 07/09/2016. Compliance with treatment has been good the patient is compliant with medications, maintains a low cholesterol diet , follows up as directed , and maintains an exercise regimen . The patient denies experiencing any hypercholesterolemia related symptoms.      Back Pain: Patient presents for presents evaluation of low back problems.  Symptoms have been present for a few years and include pain in lower back (aching and dull in character; 8/10 in severity) and stiffness in lumbar area . Initial inciting event: none. Symptoms are worst: mid-day, afternoon, evening. Alleviating factors identifiable by patient are none. Exacerbating factors identifiable by patient are bending backwards, bending forwards, and walking. Treatments so far initiated by patient:  mobic 15 mg by mouth   daily. Previous lower back problems: none. Previous workup: none. Previous treatments:  NSAIDS .     Smoking cessation instruction/counseling given:  counseled patient on the dangers of tobacco use, advised patient to stop smoking, and reviewed strategies to maximize success      Past Medical History:  Diagnosis Date   Arthritis    OA   GERD (gastroesophageal reflux disease)    Headache(784.0)    Hepatitis    history of Hepatitis 20 years ago; not sure what  kind   History of gout    Hypertension    Hypertensive encephalopathy    Stroke ([place])    SLIGHT RT SIDE WEAKNESS 2001    Past Surgical History:  Procedure Laterality Date   ABDOMINAL HYSTERECTOMY     ANTERIOR CERVICAL CORPECTOMY  03/07/2012   Procedure: ANTERIOR CERVICAL CORPECTOMY;  Surgeon: Kristeen Miss, MD;  Location: [facility];  Service: Neurosurgery;  Laterality: N/A;  Cervical six-seven, cervical seven-thoracic one Anterior cervical decompression/diskectomy/fusion, with Cervical seven Corpectomy, reconstruction using Allograft and Alphatec plate   CATARACT EXTRACTION W/PHACO Left 04/12/2017   Procedure: CATARACT EXTRACTION PHACO AND INTRAOCULAR LENS PLACEMENT ([place]);  Surgeon: Baruch Goldmann, MD;  Location: [facility];  Service: Ophthalmology;  Laterality: Left;  CDE: 3.82   CATARACT EXTRACTION W/PHACO Right 05/03/2017   Procedure: CATARACT EXTRACTION PHACO AND INTRAOCULAR LENS PLACEMENT RIGHT EYE;  Surgeon: Baruch Goldmann, MD;  Location: [facility];  Service: Ophthalmology;  Laterality: Right;  CDE: 3.89   MULTIPLE TOOTH EXTRACTIONS     TONSILLECTOMY      Family History  Adopted: Yes  Problem Relation Age of Onset   Bipolar disorder Daughter    Heart disease Daughter    Asthma Daughter    Bipolar disorder Son    Bipolar disorder Daughter    Bipolar disorder Daughter    Bipolar disorder Daughter    Hypertension Daughter    Heart disease Daughter    Drug abuse Daughter        OD    Social History   Socioeconomic History   Marital status: Widowed  Spouse name: Not on file   Number of children: 5   Years of education: Not on file   Highest education level: Not on file  Occupational History   Occupation: retired    Comment: CNA  Tobacco Use   Smoking status: Some Days    Packs/day: 0.50    Years: 50.00    Pack years: 25.00    Types: Cigarettes   Smokeless tobacco: Never  Vaping Use   Vaping Use: Never used  Substance and Sexual Activity   Alcohol use: No   Drug  use: No   Sexual activity: Not Currently    Birth control/protection: Surgical  Other Topics Concern   Not on file  Social History Narrative   5 children living, 1 deceased.    Children live out of state.   Social Determinants of Health   Financial Resource Strain: Low Risk    Difficulty of Paying Living Expenses: Not very hard  Food Insecurity: Food Insecurity Present   Worried About [place] in the Last Year: Sometimes true   Ran Out of Food in the Last Year: Sometimes true  Transportation Needs: No Transportation Needs   Lack of Transportation (Medical): No   Lack of Transportation (Non-Medical): No  Physical Activity: Sufficiently Active   Days of Exercise per Week: 5 days   Minutes of Exercise per Session: 40 min  Stress: No Stress Concern Present   Feeling of Stress : Only a little  Social Connections: Moderately Integrated   Frequency of Communication with Friends and Family: More than three times a week   Frequency of Social Gatherings with Friends and Family: More than three times a week   Attends Religious Services: More than 4 times per year   Active Member of [employer] or Organizations: Yes   Attends Archivist Meetings: More than 4 times per year   Marital Status: Widowed  Human resources officer Violence: Not At Risk   Fear of Current or Ex-Partner: No   Emotionally Abused: No   Physically Abused: No   Sexually Abused: No    Outpatient Medications Prior to Visit  Medication Sig Dispense Refill   amLODipine (NORVASC) 10 MG tablet Take 1 tablet (10 mg total) by mouth daily. 90 tablet 3   clopidogrel (PLAVIX) 75 MG tablet Take 1 tablet (75 mg total) by mouth daily. 90 tablet 3   meloxicam (MOBIC) 15 MG tablet Take 1 tablet (15 mg total) by mouth daily. 60 tablet 1   atorvastatin (LIPITOR) 40 MG tablet Take 1 tablet (40 mg total) by mouth daily. 90 tablet 1   PROAIR HFA 108 (90 Base) MCG/ACT inhaler Inhale 2 puffs into the lungs every 6 (six) hours as  needed for wheezing or shortness of breath (cough). 18 g 3   SYMBICORT 160-4.5 MCG/ACT inhaler INHALE 2 PUFFS TWICE DAILY 10.2 g 0   nicotine (NICODERM CQ - DOSED IN MG/24 HOURS) 14 mg/24hr patch Place 1 patch (14 mg total) onto the skin daily. 28 patch 6   No facility-administered medications prior to visit.    Allergies  Allergen Reactions   Penicillins Anaphylaxis, Swelling, Rash and Other (See Comments)    Has patient had a PCN reaction causing immediate rash, facial/tongue/throat swelling, SOB or lightheadedness with hypotension: yes Has patient had a PCN reaction causing severe rash involving mucus membranes or skin necrosis: no Has patient had a PCN reaction that required hospitalization: yes Has patient had a PCN reaction occurring within the last 10  years: no If all of the above answers are "NO", then may proceed with Cephalosporin use.     ROS Review of Systems  Constitutional: Negative.   HENT: Negative.    Eyes: Negative.   Respiratory: Negative.    Gastrointestinal: Negative.   Genitourinary: Negative.   Musculoskeletal:  Positive for back pain.  Skin:  Negative for rash.  Neurological: Negative.   All other systems reviewed and are negative.    Objective:    Physical Exam Vitals and nursing note reviewed.  Constitutional:      Appearance: Normal appearance.     Interventions: Face mask in place.  HENT:     Head: Normocephalic.     Nose: Nose normal.     Mouth/Throat:     Mouth: Mucous membranes are moist.     Pharynx: Oropharynx is clear.  Eyes:     Conjunctiva/sclera: Conjunctivae normal.  Cardiovascular:     Rate and Rhythm: Normal rate and regular rhythm.     Pulses: Normal pulses.     Heart sounds: Normal heart sounds.  Pulmonary:     Effort: Pulmonary effort is normal.     Breath sounds: Normal breath sounds.  Abdominal:     General: Bowel sounds are normal.  Musculoskeletal:     Lumbar back: Tenderness present. Decreased range of motion.   Skin:    General: Skin is warm.     Findings: No rash.  Neurological:     Mental Status: She is alert and oriented to person, place, and time.  Psychiatric:        Behavior: Behavior is cooperative.    BP 136/79   Pulse 79   Temp (!) 97 F (36.1 C) (Temporal)   Ht _0  (1.549 m)   Wt 121 lb 12.8 oz (55.2 kg)   SpO2 94%   BMI 23.01 kg/m  Wt Readings from Last 3 Encounters:  02/21/21 121 lb 12.8 oz (55.2 kg)  01/27/21 118 lb (53.5 kg)  11/21/20 118 lb (53.5 kg)     There are no preventive care reminders to display for this patient.   There are no preventive care reminders to display for this patient.  Lab Results  Component Value Date   TSH 2.830 04/21/2019   Lab Results  Component Value Date   WBC 5.8 11/21/2020   HGB 15.1 11/21/2020   HCT 47.0 (H) 11/21/2020   MCV 91 11/21/2020   PLT 169 11/21/2020   Lab Results  Component Value Date   NA 146 (H) 11/21/2020   K 4.4 11/21/2020   CO2 25 11/21/2020   GLUCOSE 93 11/21/2020   BUN 14 11/21/2020   CREATININE 1.02 (H) 11/21/2020   BILITOT 0.5 11/21/2020   ALKPHOS 60 11/21/2020   AST 19 11/21/2020   ALT 12 11/21/2020   PROT 6.6 11/21/2020   ALBUMIN 4.2 11/21/2020   CALCIUM 9.4 11/21/2020   ANIONGAP 8 01/23/2020   EGFR 56 (L) 11/21/2020   Lab Results  Component Value Date   CHOL 157 11/21/2020   Lab Results  Component Value Date   HDL 72 11/21/2020   Lab Results  Component Value Date   LDLCALC 74 11/21/2020   Lab Results  Component Value Date   TRIG 54 11/21/2020   Lab Results  Component Value Date   CHOLHDL 2.2 11/21/2020   Lab Results  Component Value Date   HGBA1C 5.7 (H) 02/12/2012      Assessment & Plan:   Problem List Items Addressed  This Visit       Cardiovascular and Mediastinum   Essential hypertension with goal blood pressure less than 130/80    Patient is maintaining blood pressure below 130/80.  No changes to current medication dose.  Continue low-sodium diet.   Completed labs CBC, CMP, and lipid panel results pending.  Follow-up in 3 months education provided to patient printed handouts given.      Relevant Medications   atorvastatin (LIPITOR) 40 MG tablet   Other Relevant Orders   CBC with Differential   Comprehensive metabolic panel     Respiratory   COPD with chronic bronchitis ([place])    Continue to provide education to patient on smoking cessation.      Relevant Medications   PROAIR HFA 108 (90 Base) MCG/ACT inhaler   budesonide-formoterol (SYMBICORT) 160-4.5 MCG/ACT inhaler     Nervous and Auditory   Back pain with sciatica - Primary    Patient continues to report unresolved back pain with sciatica.  Patient currently on Mobic 15 mg tablet by mouth daily.  Started patient on Flexeril 5 mg tablet by mouth daily.  Follow-up with worsening unresolved symptoms.  education provided to patient Rx sent to pharmacy.      Relevant Medications   cyclobenzaprine (FLEXERIL) 5 MG tablet     Other   Hyperlipidemia    Symptoms well managed on current medication atorvastatin 40 mg tablet by mouth daily.  Completed labs lipid panel results pending.  Follow-up in 3 months.  Rx refill sent to pharmacy.      Relevant Medications   atorvastatin (LIPITOR) 40 MG tablet   Other Relevant Orders   Lipid Panel   Tobacco abuse    Education provided to patient on smoking cessation.  Patient is not ready to quit smoking at this time.  Printed handouts given.      Other Visit Diagnoses     Need for immunization against influenza       Relevant Orders   Flu Vaccine QUAD High Dose(Fluad) (Completed)       Meds ordered this encounter  Medications   PROAIR HFA 108 (90 Base) MCG/ACT inhaler    Sig: Inhale 2 puffs into the lungs every 6 (six) hours as needed for wheezing or shortness of breath (cough).    Dispense:  18 g    Refill:  3   budesonide-formoterol (SYMBICORT) 160-4.5 MCG/ACT inhaler    Sig: Inhale 2 puffs into the lungs 2 (two) times  daily.    Dispense:  10.2 g    Refill:  4   atorvastatin (LIPITOR) 40 MG tablet    Sig: Take 1 tablet (40 mg total) by mouth daily.    Dispense:  90 tablet    Refill:  1   cyclobenzaprine (FLEXERIL) 5 MG tablet    Sig: Take 1 tablet (5 mg total) by mouth 3 (three) times daily as needed for muscle spasms.    Dispense:  30 tablet    Refill:  1    Order Specific Question:   Supervising Provider    Answer:   Janora Norlander [[id number]]    Follow-up: Return in about 3 months (around 05/23/2021).    Ivy Lynn, NP

## 2021-02-21 NOTE — Assessment & Plan Note (Signed)
Patient is maintaining blood pressure below 130/80.  No changes to current medication dose.  Continue low-sodium diet.  Completed labs CBC, CMP, and lipid panel results pending.  Follow-up in 3 months education provided to patient printed handouts given.

## 2021-02-21 NOTE — Assessment & Plan Note (Signed)
Education provided to patient on smoking cessation.  Patient is not ready to quit smoking at this time.  Printed handouts given.

## 2021-02-21 NOTE — Patient Instructions (Signed)
Cholesterol Content in Foods Cholesterol is a waxy, fat-like substance that helps to carry fat in the blood. The body needs cholesterol in small amounts, but too much cholesterol can cause damage to the arteries and heart. Most people should eat less than 200 milligrams (mg) of cholesterol a day. Foods with cholesterol Cholesterol is found in animal-based foods, such as meat, seafood, and dairy. Generally, low-fat dairy and lean meats have less cholesterol than full-fat dairy and fatty meats. The milligrams of cholesterol per serving (mg per serving) of common cholesterol-containing foods are listed below. Meat and other proteins Egg -- one large whole egg has 186 mg. Veal shank -- 4 oz has 141 mg. Lean ground turkey (93% lean) -- 4 oz has 118 mg. Fat-trimmed lamb loin -- 4 oz has 106 mg. Lean ground beef (90% lean) -- 4 oz has 100 mg. Lobster -- 3.5 oz has 90 mg. Pork loin chops -- 4 oz has 86 mg. Canned salmon -- 3.5 oz has 83 mg. Fat-trimmed beef top loin -- 4 oz has 78 mg. Frankfurter -- 1 frank (3.5 oz) has 77 mg. Crab -- 3.5 oz has 71 mg. Roasted chicken without skin, white meat -- 4 oz has 66 mg. Light bologna -- 2 oz has 45 mg. Deli-cut turkey -- 2 oz has 31 mg. Canned tuna -- 3.5 oz has 31 mg. Bacon -- 1 oz has 29 mg. Oysters and mussels (raw) -- 3.5 oz has 25 mg. Mackerel -- 1 oz has 22 mg. Trout -- 1 oz has 20 mg. Pork sausage -- 1 link (1 oz) has 17 mg. Salmon -- 1 oz has 16 mg. Tilapia -- 1 oz has 14 mg. Dairy Soft-serve ice cream --  cup (4 oz) has 103 mg. Whole-milk yogurt -- 1 cup (8 oz) has 29 mg. Cheddar cheese -- 1 oz has 28 mg. American cheese -- 1 oz has 28 mg. Whole milk -- 1 cup (8 oz) has 23 mg. 2% milk -- 1 cup (8 oz) has 18 mg. Cream cheese -- 1 tablespoon (Tbsp) has 15 mg. Cottage cheese --  cup (4 oz) has 14 mg. Low-fat (1%) milk -- 1 cup (8 oz) has 10 mg. Sour cream -- 1 Tbsp has 8.5 mg. Low-fat yogurt -- 1 cup (8 oz) has 8 mg. Nonfat Greek  yogurt -- 1 cup (8 oz) has 7 mg. Half-and-half cream -- 1 Tbsp has 5 mg. Fats and oils Cod liver oil -- 1 tablespoon (Tbsp) has 82 mg. Butter -- 1 Tbsp has 15 mg. Lard -- 1 Tbsp has 14 mg. Bacon grease -- 1 Tbsp has 14 mg. Mayonnaise -- 1 Tbsp has 5-10 mg. Margarine -- 1 Tbsp has 3-10 mg. Exact amounts of cholesterol in these foods may vary depending on specific ingredients and brands. Foods without cholesterol Most plant-based foods do not have cholesterol unless you combine them with a food that has cholesterol. Foods without cholesterol include: Grains and cereals. Vegetables. Fruits. Vegetable oils, such as olive, canola, and sunflower oil. Legumes, such as peas, beans, and lentils. Nuts and seeds. Egg whites. Summary The body needs cholesterol in small amounts, but too much cholesterol can cause damage to the arteries and heart. Most people should eat less than 200 milligrams (mg) of cholesterol a day. This information is not intended to replace advice given to you by your health care provider. Make sure you discuss any questions you have with your health care provider. Document Revised: 08/25/2019 Document Reviewed: 10/05/2019 Elsevier Patient Education    2022 [employer] Acute Back Pain, Adult Acute back pain is sudden and usually short-lived. It is often caused by an injury to the muscles and tissues in the back. The injury may result from: A muscle, tendon, or ligament getting overstretched or torn. Ligaments are tissues that connect bones to each other. Lifting something improperly can cause a back strain. Wear and tear (degeneration) of the spinal disks. Spinal disks are circular tissue that provide cushioning between the bones of the spine (vertebrae). Twisting motions, such as while playing sports or doing yard work. A hit to the back. Arthritis. You may have a physical exam, lab tests, and imaging tests to find the cause of your pain. Acute back pain usually goes away  with rest and home care. Follow these instructions at home: Managing pain, stiffness, and swelling Take over-the-counter and prescription medicines only as told by your health care provider. Treatment may include medicines for pain and inflammation that are taken by mouth or applied to the skin, or muscle relaxants. Your health care provider may recommend applying ice during the first 24-48 hours after your pain starts. To do this: Put ice in a plastic bag. Place a towel between your skin and the bag. Leave the ice on for 20 minutes, 2-3 times a day. Remove the ice if your skin turns bright red. This is very important. If you cannot feel pain, heat, or cold, you have a greater risk of damage to the area. If directed, apply heat to the affected area as often as told by your health care provider. Use the heat source that your health care provider recommends, such as a moist heat pack or a heating pad. Place a towel between your skin and the heat source. Leave the heat on for 20-30 minutes. Remove the heat if your skin turns bright red. This is especially important if you are unable to feel pain, heat, or cold. You have a greater risk of getting burned. Activity  Do not stay in bed. Staying in bed for more than 1-2 days can delay your recovery. Sit up and stand up straight. Avoid leaning forward when you sit or hunching over when you stand. If you work at a desk, sit close to it so you do not need to lean over. Keep your chin tucked in. Keep your neck drawn back, and keep your elbows bent at a 90-degree angle (right angle). Sit high and close to the steering wheel when you drive. Add lower back (lumbar) support to your car seat, if needed. Take short walks on even surfaces as soon as you are able. Try to increase the length of time you walk each day. Do not sit, drive, or stand in one place for more than 30 minutes at a time. Sitting or standing for long periods of time can put stress on your  back. Do not drive or use heavy machinery while taking prescription pain medicine. Use proper lifting techniques. When you bend and lift, use positions that put less stress on your back: [place] your knees. Keep the load close to your body. Avoid twisting. Exercise regularly as told by your health care provider. Exercising helps your back heal faster and helps prevent back injuries by keeping muscles strong and flexible. Work with a physical therapist to make a safe exercise program, as recommended by your health care provider. Do any exercises as told by your physical therapist. Lifestyle Maintain a healthy weight. Extra weight puts stress on your back and makes it  difficult to have good posture. Avoid activities or situations that make you feel anxious or stressed. Stress and anxiety increase muscle tension and can make back pain worse. Learn ways to manage anxiety and stress, such as through exercise. General instructions Sleep on a firm mattress in a comfortable position. Try lying on your side with your knees slightly bent. If you lie on your back, put a pillow under your knees. Keep your head and neck in a straight line with your spine (neutral position) when using electronic equipment like smartphones or pads. To do this: Raise your smartphone or pad to look at it instead of bending your head or neck to look down. Put the smartphone or pad at the level of your face while looking at the screen. Follow your treatment plan as told by your health care provider. This may include: Cognitive or behavioral therapy. Acupuncture or massage therapy. Meditation or yoga. Contact a health care provider if: You have pain that is not relieved with rest or medicine. You have increasing pain going down into your legs or buttocks. Your pain does not improve after 2 weeks. You have pain at night. You lose weight without trying. You have a fever or chills. You develop nausea or vomiting. You develop  abdominal pain. Get help right away if: You develop new bowel or bladder control problems. You have unusual weakness or numbness in your arms or legs. You feel faint. These symptoms may represent a serious problem that is an emergency. Do not wait to see if the symptoms will go away. Get medical help right away. Call your local emergency services (911 in the [place] Do not drive yourself to the hospital. Summary Acute back pain is sudden and usually short-lived. Use proper lifting techniques. When you bend and lift, use positions that put less stress on your back. Take over-the-counter and prescription medicines only as told by your health care provider, and apply heat or ice as told. This information is not intended to replace advice given to you by your health care provider. Make sure you discuss any questions you have with your health care provider. Document Revised: 08/05/2020 Document Reviewed: 08/05/2020 Elsevier Patient Education  2022 [employer] Hypertension, Adult Hypertension is another name for high blood pressure. High blood pressure forces your heart to work harder to pump blood. This can cause problems over time. There are two numbers in a blood pressure reading. There is a top number (systolic) over a bottom number (diastolic). It is best to have a blood pressure that is below 120/80. Healthy choices can help lower your blood pressure, or you may need medicine to help lower it. What are the causes? The cause of this condition is not known. Some conditions may be related to high blood pressure. What increases the risk? Smoking. Having type 2 diabetes mellitus, high cholesterol, or both. Not getting enough exercise or physical activity. Being overweight. Having too much fat, sugar, calories, or salt (sodium) in your diet. Drinking too much alcohol. Having long-term (chronic) kidney disease. Having a family history of high blood pressure. Age. Risk increases with age. Race.  You may be at higher risk if you are African American. Gender. Men are at higher risk than women before age 78. After age 17, women are at higher risk than men. Having obstructive sleep apnea. Stress. What are the signs or symptoms? High blood pressure may not cause symptoms. Very high blood pressure (hypertensive crisis) may cause: Headache. Feelings of worry or nervousness (anxiety).  Shortness of breath. Nosebleed. A feeling of being sick to your stomach (nausea). Throwing up (vomiting). Changes in how you see. Very bad chest pain. Seizures. How is this treated? This condition is treated by making healthy lifestyle changes, such as: Eating healthy foods. Exercising more. Drinking less alcohol. Your health care provider may prescribe medicine if lifestyle changes are not enough to get your blood pressure under control, and if: Your top number is above 130. Your bottom number is above 80. Your personal target blood pressure may vary. Follow these instructions at home: Eating and drinking  If told, follow the DASH eating plan. To follow this plan: Fill one half of your plate at each meal with fruits and vegetables. Fill one fourth of your plate at each meal with whole grains. Whole grains include whole-wheat pasta, brown rice, and whole-grain bread. Eat or drink low-fat dairy products, such as skim milk or low-fat yogurt. Fill one fourth of your plate at each meal with low-fat (lean) proteins. Low-fat proteins include fish, chicken without skin, eggs, beans, and tofu. Avoid fatty meat, cured and processed meat, or chicken with skin. Avoid pre-made or processed food. Eat less than 1,500 mg of salt each day. Do not drink alcohol if: Your doctor tells you not to drink. You are pregnant, may be pregnant, or are planning to become pregnant. If you drink alcohol: Limit how much you use to: 0-1 drink a day for women. 0-2 drinks a day for men. Be aware of how much alcohol is in your  drink. In the [place] one drink equals one 12 oz bottle of beer (355 mL), one 5 oz glass of wine (148 mL), or one 1 oz glass of hard liquor (44 mL). Lifestyle  Work with your doctor to stay at a healthy weight or to lose weight. Ask your doctor what the best weight is for you. Get at least 30 minutes of exercise most days of the week. This may include walking, swimming, or biking. Get at least 30 minutes of exercise that strengthens your muscles (resistance exercise) at least 3 days a week. This may include lifting weights or doing Pilates. Do not use any products that contain nicotine or tobacco, such as cigarettes, e-cigarettes, and chewing tobacco. If you need help quitting, ask your doctor. Check your blood pressure at home as told by your doctor. Keep all follow-up visits as told by your doctor. This is important. Medicines Take over-the-counter and prescription medicines only as told by your doctor. Follow directions carefully. Do not skip doses of blood pressure medicine. The medicine does not work as well if you skip doses. Skipping doses also puts you at risk for problems. Ask your doctor about side effects or reactions to medicines that you should watch for. Contact a doctor if you: Think you are having a reaction to the medicine you are taking. Have headaches that keep coming back (recurring). Feel dizzy. Have swelling in your ankles. Have trouble with your vision. Get help right away if you: Get a very bad headache. Start to feel mixed up (confused). Feel weak or numb. Feel faint. Have very bad pain in your: Chest. Belly (abdomen). Throw up more than once. Have trouble breathing. Summary Hypertension is another name for high blood pressure. High blood pressure forces your heart to work harder to pump blood. For most people, a normal blood pressure is less than 120/80. Making healthy choices can help lower blood pressure. If your blood pressure does not get lower with  healthy choices, you may need to take medicine. This information is not intended to replace advice given to you by your health care provider. Make sure you discuss any questions you have with your health care provider. Document Revised: 01/22/2018 Document Reviewed: 01/22/2018 Elsevier Patient Education  2022 [employer].

## 2021-02-21 NOTE — Assessment & Plan Note (Signed)
Continue to provide education to patient on smoking cessation.

## 2021-02-21 NOTE — Assessment & Plan Note (Addendum)
Patient continues to report unresolved back pain with sciatica.  Patient currently on Mobic 15 mg tablet by mouth daily.  Started patient on Flexeril 5 mg tablet by mouth daily.  Follow-up with worsening unresolved symptoms.  education provided to patient Rx sent to pharmacy.

## 2021-02-21 NOTE — Assessment & Plan Note (Signed)
Symptoms well managed on current medication atorvastatin 40 mg tablet by mouth daily.  Completed labs lipid panel results pending.  Follow-up in 3 months.  Rx refill sent to pharmacy.

## 2021-02-22 LAB — COMPREHENSIVE METABOLIC PANEL
ALT: 11 IU/L (ref 0–32)
AST: 17 IU/L (ref 0–40)
Albumin/Globulin Ratio: 1.8 (ref 1.2–2.2)
Albumin: 4.3 g/dL (ref 3.7–4.7)
Alkaline Phosphatase: 47 IU/L (ref 44–121)
BUN/Creatinine Ratio: 11 — ABNORMAL LOW (ref 12–28)
BUN: 12 mg/dL (ref 8–27)
Bilirubin Total: 0.3 mg/dL (ref 0.0–1.2)
CO2: 25 mmol/L (ref 20–29)
Calcium: 9.2 mg/dL (ref 8.7–10.3)
Chloride: 107 mmol/L — ABNORMAL HIGH (ref 96–106)
Creatinine, Ser: 1.07 mg/dL — ABNORMAL HIGH (ref 0.57–1.00)
Globulin, Total: 2.4 g/dL (ref 1.5–4.5)
Glucose: 82 mg/dL (ref 70–99)
Potassium: 4.1 mmol/L (ref 3.5–5.2)
Sodium: 144 mmol/L (ref 134–144)
Total Protein: 6.7 g/dL (ref 6.0–8.5)
eGFR: 53 mL/min/{1.73_m2} — ABNORMAL LOW (ref >59)

## 2021-02-22 LAB — CBC WITH DIFFERENTIAL/PLATELET
Basophils Absolute: 0 10*3/uL (ref 0.0–0.2)
Basos: 1 %
EOS (ABSOLUTE): 0.2 10*3/uL (ref 0.0–0.4)
Eos: 3 %
Hematocrit: 44.9 % (ref 34.0–46.6)
Hemoglobin: 14.8 g/dL (ref 11.1–15.9)
Immature Grans (Abs): 0 10*3/uL (ref 0.0–0.1)
Immature Granulocytes: 0 %
Lymphocytes Absolute: 2.1 10*3/uL (ref 0.7–3.1)
Lymphs: 35 %
MCH: 30.1 pg (ref 26.6–33.0)
MCHC: 33 g/dL (ref 31.5–35.7)
MCV: 91 fL (ref 79–97)
Monocytes Absolute: 0.5 10*3/uL (ref 0.1–0.9)
Monocytes: 9 %
Neutrophils Absolute: 3.1 10*3/uL (ref 1.4–7.0)
Neutrophils: 52 %
Platelets: 131 10*3/uL — ABNORMAL LOW (ref 150–450)
RBC: 4.91 x10E6/uL (ref 3.77–5.28)
RDW: 12.2 % (ref 11.7–15.4)
WBC: 5.9 10*3/uL (ref 3.4–10.8)

## 2021-02-22 LAB — LIPID PANEL
Chol/HDL Ratio: 2.1 ratio (ref 0.0–4.4)
Cholesterol, Total: 167 mg/dL (ref 100–199)
HDL: 79 mg/dL (ref >39)
LDL Chol Calc (NIH): 76 mg/dL (ref 0–99)
Triglycerides: 58 mg/dL (ref 0–149)
VLDL Cholesterol Cal: 12 mg/dL (ref 5–40)

## 2021-03-11 ENCOUNTER — Other Ambulatory Visit: Payer: Self-pay | Admitting: Family Medicine

## 2021-03-11 DIAGNOSIS — Z1231 Encounter for screening mammogram for malignant neoplasm of breast: Secondary | ICD-10-CM

## 2021-04-26 ENCOUNTER — Ambulatory Visit
Admission: RE | Admit: 2021-04-26 | Discharge: 2021-04-26 | Disposition: A | Discharge status: Home / Self Care | Payer: Medicare Other | Source: Ambulatory Visit | Attending: Nurse Practitioner | Admitting: Nurse Practitioner

## 2021-04-26 ENCOUNTER — Other Ambulatory Visit: Payer: Self-pay

## 2021-04-26 DIAGNOSIS — Z1231 Encounter for screening mammogram for malignant neoplasm of breast: Secondary | ICD-10-CM

## 2021-05-15 ENCOUNTER — Emergency Department (HOSPITAL_COMMUNITY): Payer: Medicare Other

## 2021-05-15 ENCOUNTER — Observation Stay (HOSPITAL_COMMUNITY)
Admission: EM | Admit: 2021-05-15 | Discharge: 2021-05-16 | Disposition: A | Discharge status: Home / Self Care | Payer: Medicare Other | Attending: Internal Medicine | Admitting: Internal Medicine

## 2021-05-15 ENCOUNTER — Other Ambulatory Visit: Payer: Self-pay

## 2021-05-15 ENCOUNTER — Encounter (HOSPITAL_COMMUNITY): Payer: Self-pay

## 2021-05-15 DIAGNOSIS — Z8673 Personal history of transient ischemic attack (TIA), and cerebral infarction without residual deficits: Secondary | ICD-10-CM | POA: Insufficient documentation

## 2021-05-15 DIAGNOSIS — K567 Ileus, unspecified: Principal | ICD-10-CM | POA: Diagnosis present

## 2021-05-15 DIAGNOSIS — Z79899 Other long term (current) drug therapy: Secondary | ICD-10-CM | POA: Insufficient documentation

## 2021-05-15 DIAGNOSIS — J449 Chronic obstructive pulmonary disease, unspecified: Secondary | ICD-10-CM | POA: Diagnosis not present

## 2021-05-15 DIAGNOSIS — R109 Unspecified abdominal pain: Secondary | ICD-10-CM | POA: Diagnosis not present

## 2021-05-15 DIAGNOSIS — Z743 Need for continuous supervision: Secondary | ICD-10-CM | POA: Diagnosis not present

## 2021-05-15 DIAGNOSIS — Z7902 Long term (current) use of antithrombotics/antiplatelets: Secondary | ICD-10-CM | POA: Diagnosis not present

## 2021-05-15 DIAGNOSIS — F17210 Nicotine dependence, cigarettes, uncomplicated: Secondary | ICD-10-CM | POA: Insufficient documentation

## 2021-05-15 DIAGNOSIS — R1033 Periumbilical pain: Secondary | ICD-10-CM | POA: Diagnosis not present

## 2021-05-15 DIAGNOSIS — I129 Hypertensive chronic kidney disease with stage 1 through stage 4 chronic kidney disease, or unspecified chronic kidney disease: Secondary | ICD-10-CM | POA: Insufficient documentation

## 2021-05-15 DIAGNOSIS — I878 Other specified disorders of veins: Secondary | ICD-10-CM | POA: Diagnosis not present

## 2021-05-15 DIAGNOSIS — N1831 Chronic kidney disease, stage 3a: Secondary | ICD-10-CM | POA: Insufficient documentation

## 2021-05-15 DIAGNOSIS — J439 Emphysema, unspecified: Secondary | ICD-10-CM | POA: Diagnosis not present

## 2021-05-15 DIAGNOSIS — M19011 Primary osteoarthritis, right shoulder: Secondary | ICD-10-CM | POA: Diagnosis not present

## 2021-05-15 DIAGNOSIS — I701 Atherosclerosis of renal artery: Secondary | ICD-10-CM | POA: Diagnosis not present

## 2021-05-15 DIAGNOSIS — K6389 Other specified diseases of intestine: Secondary | ICD-10-CM | POA: Diagnosis not present

## 2021-05-15 DIAGNOSIS — I2510 Atherosclerotic heart disease of native coronary artery without angina pectoris: Secondary | ICD-10-CM | POA: Diagnosis not present

## 2021-05-15 DIAGNOSIS — Z20822 Contact with and (suspected) exposure to covid-19: Secondary | ICD-10-CM | POA: Diagnosis not present

## 2021-05-15 DIAGNOSIS — N3289 Other specified disorders of bladder: Secondary | ICD-10-CM | POA: Diagnosis not present

## 2021-05-15 DIAGNOSIS — I10 Essential (primary) hypertension: Secondary | ICD-10-CM | POA: Diagnosis not present

## 2021-05-15 DIAGNOSIS — R0689 Other abnormalities of breathing: Secondary | ICD-10-CM | POA: Diagnosis not present

## 2021-05-15 DIAGNOSIS — K5730 Diverticulosis of large intestine without perforation or abscess without bleeding: Secondary | ICD-10-CM | POA: Diagnosis not present

## 2021-05-15 DIAGNOSIS — R6889 Other general symptoms and signs: Secondary | ICD-10-CM | POA: Diagnosis not present

## 2021-05-15 HISTORY — DX: Ileus, unspecified: K56.7

## 2021-05-15 LAB — CBC WITH DIFFERENTIAL/PLATELET
Abs Immature Granulocytes: 0.02 10*3/uL (ref 0.00–0.07)
Basophils Absolute: 0 10*3/uL (ref 0.0–0.1)
Basophils Relative: 0 %
Eosinophils Absolute: 0.2 10*3/uL (ref 0.0–0.5)
Eosinophils Relative: 2 %
HCT: 51.9 % — ABNORMAL HIGH (ref 36.0–46.0)
Hemoglobin: 16.5 g/dL — ABNORMAL HIGH (ref 12.0–15.0)
Immature Granulocytes: 0 %
Lymphocytes Relative: 12 %
Lymphs Abs: 1 10*3/uL (ref 0.7–4.0)
MCH: 29.8 pg (ref 26.0–34.0)
MCHC: 31.8 g/dL (ref 30.0–36.0)
MCV: 93.9 fL (ref 80.0–100.0)
Monocytes Absolute: 0.4 10*3/uL (ref 0.1–1.0)
Monocytes Relative: 5 %
Neutro Abs: 6.5 10*3/uL (ref 1.7–7.7)
Neutrophils Relative %: 81 %
Platelets: 179 10*3/uL (ref 150–400)
RBC: 5.53 MIL/uL — ABNORMAL HIGH (ref 3.87–5.11)
RDW: 13.5 % (ref 11.5–15.5)
WBC: 8.1 10*3/uL (ref 4.0–10.5)
nRBC: 0 % (ref 0.0–0.2)

## 2021-05-15 LAB — HEPATIC FUNCTION PANEL
ALT: 13 U/L (ref 0–44)
AST: 21 U/L (ref 15–41)
Albumin: 4 g/dL (ref 3.5–5.0)
Alkaline Phosphatase: 51 U/L (ref 38–126)
Bilirubin, Direct: 0.2 mg/dL (ref 0.0–0.2)
Indirect Bilirubin: 0.6 mg/dL (ref 0.3–0.9)
Total Bilirubin: 0.8 mg/dL (ref 0.3–1.2)
Total Protein: 7.4 g/dL (ref 6.5–8.1)

## 2021-05-15 LAB — RESP PANEL BY RT-PCR (FLU A&B, COVID) ARPGX2
Influenza A by PCR: NEGATIVE
Influenza B by PCR: NEGATIVE
SARS Coronavirus 2 by RT PCR: NEGATIVE

## 2021-05-15 LAB — BASIC METABOLIC PANEL
Anion gap: 10 (ref 5–15)
BUN: 16 mg/dL (ref 8–23)
CO2: 28 mmol/L (ref 22–32)
Calcium: 8.8 mg/dL — ABNORMAL LOW (ref 8.9–10.3)
Chloride: 102 mmol/L (ref 98–111)
Creatinine, Ser: 0.91 mg/dL (ref 0.44–1.00)
GFR, Estimated: >60 mL/min (ref >60)
Glucose, Bld: 103 mg/dL — ABNORMAL HIGH (ref 70–99)
Potassium: 4.1 mmol/L (ref 3.5–5.1)
Sodium: 140 mmol/L (ref 135–145)

## 2021-05-15 LAB — LIPASE, BLOOD: Lipase: 31 U/L (ref 11–51)

## 2021-05-15 MED ORDER — ATORVASTATIN CALCIUM 40 MG PO TABS
40.0000 mg | ORAL_TABLET | Freq: Every day | ORAL | Status: DC
  Administered 2021-05-15: 19:00:00 40 mg via ORAL
  Filled 2021-05-15: qty 1

## 2021-05-15 MED ORDER — MELOXICAM 7.5 MG PO TABS
15.0000 mg | ORAL_TABLET | Freq: Every day | ORAL | Status: DC
  Administered 2021-05-15 – 2021-05-16 (×2): 15 mg via ORAL
  Filled 2021-05-15 (×5): qty 2

## 2021-05-15 MED ORDER — SODIUM CHLORIDE 0.9 % IV BOLUS
1000.0000 mL | Freq: Once | INTRAVENOUS | Status: AC
  Administered 2021-05-15: 10:00:00 1000 mL via INTRAVENOUS

## 2021-05-15 MED ORDER — ALBUTEROL SULFATE HFA 108 (90 BASE) MCG/ACT IN AERS: 2.0000 | INHALATION_SPRAY | Freq: Four times a day (QID) | RESPIRATORY_TRACT | Status: DC | PRN

## 2021-05-15 MED ORDER — ACETAMINOPHEN 325 MG PO TABS
650.0000 mg | ORAL_TABLET | Freq: Four times a day (QID) | ORAL | Status: DC | PRN
  Administered 2021-05-15: 14:00:00 650 mg via ORAL
  Filled 2021-05-15: qty 2

## 2021-05-15 MED ORDER — PANTOPRAZOLE SODIUM 40 MG IV SOLR
40.0000 mg | Freq: Every day | INTRAVENOUS | Status: DC
  Administered 2021-05-15 – 2021-05-16 (×2): 40 mg via INTRAVENOUS
  Filled 2021-05-15 (×2): qty 40

## 2021-05-15 MED ORDER — ACETAMINOPHEN 650 MG RE SUPP: 650.0000 mg | Freq: Four times a day (QID) | RECTAL | Status: DC | PRN

## 2021-05-15 MED ORDER — COVID-19MRNA BIVAL VAC MODERNA 50 MCG/0.5ML IM SUSP
0.5000 mL | Freq: Once | INTRAMUSCULAR | Status: DC
  Filled 2021-05-15: qty 0.5

## 2021-05-15 MED ORDER — MORPHINE SULFATE (PF) 4 MG/ML IV SOLN
4.0000 mg | Freq: Once | INTRAVENOUS | Status: AC
  Administered 2021-05-15: 10:00:00 4 mg via INTRAVENOUS
  Filled 2021-05-15: qty 1

## 2021-05-15 MED ORDER — ENOXAPARIN SODIUM 40 MG/0.4ML IJ SOSY
40.0000 mg | PREFILLED_SYRINGE | Freq: Every day | INTRAMUSCULAR | Status: DC
  Administered 2021-05-15 – 2021-05-16 (×2): 40 mg via SUBCUTANEOUS
  Filled 2021-05-15 (×2): qty 0.4

## 2021-05-15 MED ORDER — CLOPIDOGREL BISULFATE 75 MG PO TABS
75.0000 mg | ORAL_TABLET | Freq: Every day | ORAL | Status: DC
  Administered 2021-05-15 – 2021-05-16 (×2): 75 mg via ORAL
  Filled 2021-05-15 (×2): qty 1

## 2021-05-15 MED ORDER — CYCLOBENZAPRINE HCL 10 MG PO TABS: 5.0000 mg | ORAL_TABLET | Freq: Three times a day (TID) | ORAL | Status: DC | PRN

## 2021-05-15 MED ORDER — SODIUM CHLORIDE 0.9% FLUSH
3.0000 mL | Freq: Two times a day (BID) | INTRAVENOUS | Status: DC
  Administered 2021-05-15 – 2021-05-16 (×2): 3 mL via INTRAVENOUS

## 2021-05-15 MED ORDER — SODIUM CHLORIDE 0.9 % IV SOLN: 250.0000 mL | INTRAVENOUS | Status: DC | PRN

## 2021-05-15 MED ORDER — IOHEXOL 300 MG/ML  SOLN: 100.0000 mL | Freq: Once | INTRAMUSCULAR | Status: DC | PRN

## 2021-05-15 MED ORDER — ONDANSETRON HCL 4 MG/2ML IJ SOLN: 4.0000 mg | Freq: Four times a day (QID) | INTRAMUSCULAR | Status: DC | PRN

## 2021-05-15 MED ORDER — ONDANSETRON HCL 4 MG/2ML IJ SOLN
4.0000 mg | Freq: Once | INTRAMUSCULAR | Status: AC
  Administered 2021-05-15: 10:00:00 4 mg via INTRAVENOUS
  Filled 2021-05-15: qty 2

## 2021-05-15 MED ORDER — SODIUM CHLORIDE 0.9% FLUSH: 3.0000 mL | INTRAVENOUS | Status: DC | PRN

## 2021-05-15 MED ORDER — IOHEXOL 350 MG/ML SOLN
100.0000 mL | Freq: Once | INTRAVENOUS | Status: AC | PRN
  Administered 2021-05-15: 11:00:00 100 mL via INTRAVENOUS

## 2021-05-15 MED ORDER — MOMETASONE FURO-FORMOTEROL FUM 200-5 MCG/ACT IN AERO
2.0000 | INHALATION_SPRAY | Freq: Two times a day (BID) | RESPIRATORY_TRACT | Status: DC
  Administered 2021-05-15 – 2021-05-16 (×3): 2 via RESPIRATORY_TRACT
  Filled 2021-05-15: qty 8.8

## 2021-05-15 MED ORDER — AMLODIPINE BESYLATE 5 MG PO TABS
10.0000 mg | ORAL_TABLET | Freq: Every day | ORAL | Status: DC
  Administered 2021-05-15 – 2021-05-16 (×2): 10 mg via ORAL
  Filled 2021-05-15 (×2): qty 2

## 2021-05-15 MED ORDER — ONDANSETRON HCL 4 MG PO TABS: 4.0000 mg | ORAL_TABLET | Freq: Four times a day (QID) | ORAL | Status: DC | PRN

## 2021-05-15 NOTE — ED Notes (Signed)
Attempted IV x2. Second nurse attempting at this time.

## 2021-05-15 NOTE — H&P (Signed)
History and Physical    [name] [id number] DOB: 21-Sep-1940 DOA: 05/15/2021  PCP: Claretta Fraise, MD   Patient coming from: Home  Chief Complaint: Abdominal pain with nausea and vomiting  HPI: [name] is a 80 y.o. female with medical history significant for COPD, hypertension, dyslipidemia, PAD, prior CVA, and prior hysterectomy who presented to the ED with complaints of sudden onset epigastric abdominal pain with radiation to her chest that began at 10 PM last night.  She states she did this began shortly after her large dog ran into her belly earlier that evening.  She claims to have had several episodes of nausea and vomiting as well with no hematemesis.  The symptoms prompted her to come to the ED for full evaluation, but she is currently feeling back to her baseline with no further symptoms.  She denies any fevers, chills, diarrhea, constipation, or recent weight change.   ED Course: Stable vital signs noted and CT study with angiogram of the abdomen and pelvis does not demonstrate any significant findings.  Laboratory data otherwise within normal limits.  I have offered patient discharge with trial of soft food, but she would prefer to stay in the hospital overnight for observation to ensure that her symptoms have resolved.  Review of Systems: Reviewed as noted above, otherwise negative.  Past Medical History:  Diagnosis Date   Arthritis    OA   GERD (gastroesophageal reflux disease)    Headache(784.0)    Hepatitis    history of Hepatitis 20 years ago; not sure what kind   History of gout    Hypertension    Hypertensive encephalopathy    Stroke ([place])    SLIGHT RT SIDE WEAKNESS 2001    Past Surgical History:  Procedure Laterality Date   ABDOMINAL HYSTERECTOMY     ANTERIOR CERVICAL CORPECTOMY  03/07/2012   Procedure: ANTERIOR CERVICAL CORPECTOMY;  Surgeon: Kristeen Miss, MD;  Location: [facility];  Service: Neurosurgery;  Laterality: N/A;  Cervical  six-seven, cervical seven-thoracic one Anterior cervical decompression/diskectomy/fusion, with Cervical seven Corpectomy, reconstruction using Allograft and Alphatec plate   CATARACT EXTRACTION W/PHACO Left 04/12/2017   Procedure: CATARACT EXTRACTION PHACO AND INTRAOCULAR LENS PLACEMENT ([place]);  Surgeon: Baruch Goldmann, MD;  Location: [facility];  Service: Ophthalmology;  Laterality: Left;  CDE: 3.82   CATARACT EXTRACTION W/PHACO Right 05/03/2017   Procedure: CATARACT EXTRACTION PHACO AND INTRAOCULAR LENS PLACEMENT RIGHT EYE;  Surgeon: Baruch Goldmann, MD;  Location: [facility];  Service: Ophthalmology;  Laterality: Right;  CDE: 3.89   MULTIPLE TOOTH EXTRACTIONS     TONSILLECTOMY       reports that she has been smoking cigarettes. She has a 25.00 pack-year smoking history. She has never used smokeless tobacco. She reports current alcohol use. She reports that she does not use drugs.  Allergies  Allergen Reactions   Penicillins Anaphylaxis, Swelling, Rash and Other (See Comments)    Has patient had a PCN reaction causing immediate rash, facial/tongue/throat swelling, SOB or lightheadedness with hypotension: yes Has patient had a PCN reaction causing severe rash involving mucus membranes or skin necrosis: no Has patient had a PCN reaction that required hospitalization: yes Has patient had a PCN reaction occurring within the last 10 years: no If all of the above answers are "NO", then may proceed with Cephalosporin use.     Family History  Adopted: Yes  Problem Relation Age of Onset   Bipolar disorder Daughter    Heart disease Daughter  Asthma Daughter    Bipolar disorder Son    Bipolar disorder Daughter    Bipolar disorder Daughter    Bipolar disorder Daughter    Hypertension Daughter    Heart disease Daughter    Drug abuse Daughter        OD    Prior to Admission medications   Medication Sig Start Date End Date Taking? Authorizing Provider  amLODipine (NORVASC) 10 MG tablet Take 1  tablet (10 mg total) by mouth daily. 08/19/20  Yes Ivy Lynn, NP  atorvastatin (LIPITOR) 40 MG tablet Take 1 tablet (40 mg total) by mouth daily. 02/21/21  Yes Ivy Lynn, NP  budesonide-formoterol (SYMBICORT) 160-4.5 MCG/ACT inhaler Inhale 2 puffs into the lungs 2 (two) times daily. 02/21/21  Yes Ivy Lynn, NP  clopidogrel (PLAVIX) 75 MG tablet Take 1 tablet (75 mg total) by mouth daily. 08/19/20  Yes Ivy Lynn, NP  cyclobenzaprine (FLEXERIL) 5 MG tablet Take 1 tablet (5 mg total) by mouth 3 (three) times daily as needed for muscle spasms. 02/21/21  Yes Ivy Lynn, NP  meloxicam (MOBIC) 15 MG tablet Take 1 tablet (15 mg total) by mouth daily. 08/19/20  Yes Ivy Lynn, NP  PROAIR HFA 108 (90 Base) MCG/ACT inhaler Inhale 2 puffs into the lungs every 6 (six) hours as needed for wheezing or shortness of breath (cough). 02/21/21  Yes Ivy Lynn, NP    Physical Exam: Vitals:   05/15/21 0930 05/15/21 1015 05/15/21 1030 05/15/21 1130  BP: 132/89 (!) 169/71 (!) 146/71 (!) 143/75  Pulse: 77 75 70 82  Resp: (!) 22 13 16  (!) 21  Temp:      TempSrc:      SpO2: 95% 94% 100% 100%  Weight:      Height:        Constitutional: NAD, calm, comfortable Vitals:   05/15/21 0930 05/15/21 1015 05/15/21 1030 05/15/21 1130  BP: 132/89 (!) 169/71 (!) 146/71 (!) 143/75  Pulse: 77 75 70 82  Resp: (!) 22 13 16  (!) 21  Temp:      TempSrc:      SpO2: 95% 94% 100% 100%  Weight:      Height:       Eyes: lids and conjunctivae normal Neck: normal, supple Respiratory: clear to auscultation bilaterally. Normal respiratory effort. No accessory muscle use.  Cardiovascular: Regular rate and rhythm, no murmurs. Abdomen: no tenderness, no distention. Bowel sounds positive.  Musculoskeletal:  No edema. Skin: no rashes, lesions, ulcers.  Psychiatric: Flat affect  Labs on Admission: I have personally reviewed following labs and imaging studies  CBC: Recent Labs  Lab  05/15/21 0925  WBC 8.1  NEUTROABS 6.5  HGB 16.5*  HCT 51.9*  MCV 93.9  PLT 0000000   Basic Metabolic Panel: Recent Labs  Lab 05/15/21 0925  NA 140  K 4.1  CL 102  CO2 28  GLUCOSE 103*  BUN 16  CREATININE 0.91  CALCIUM 8.8*   GFR: Estimated Creatinine Clearance: 38.4 mL/min (by C-G formula based on SCr of 0.91 mg/dL). Liver Function Tests: Recent Labs  Lab 05/15/21 0925  AST 21  ALT 13  ALKPHOS 51  BILITOT 0.8  PROT 7.4  ALBUMIN 4.0   Recent Labs  Lab 05/15/21 0925  LIPASE 31   No results for input(s): AMMONIA in the last 168 hours. Coagulation Profile: No results for input(s): INR, PROTIME in the last 168 hours. Cardiac Enzymes: No results for input(s): CKTOTAL, CKMB,  CKMBINDEX, TROPONINI in the last 168 hours. BNP (last 3 results) No results for input(s): PROBNP in the last 8760 hours. HbA1C: No results for input(s): HGBA1C in the last 72 hours. CBG: No results for input(s): GLUCAP in the last 168 hours. Lipid Profile: No results for input(s): CHOL, HDL, LDLCALC, TRIG, CHOLHDL, LDLDIRECT in the last 72 hours. Thyroid Function Tests: No results for input(s): TSH, T4TOTAL, FREET4, T3FREE, THYROIDAB in the last 72 hours. Anemia Panel: No results for input(s): VITAMINB12, FOLATE, FERRITIN, TIBC, IRON, RETICCTPCT in the last 72 hours. Urine analysis:    Component Value Date/Time   COLORURINE YELLOW 01/22/2020 1500   APPEARANCEUR CLEAR 01/22/2020 1500   LABSPEC 1.045 (H) 01/22/2020 1500   PHURINE 5.0 01/22/2020 1500   GLUCOSEU NEGATIVE 01/22/2020 1500   HGBUR NEGATIVE 01/22/2020 1500   BILIRUBINUR NEGATIVE 01/22/2020 1500   KETONESUR 5 (A) 01/22/2020 1500   PROTEINUR NEGATIVE 01/22/2020 1500   UROBILINOGEN 0.2 02/11/2012 1600   NITRITE NEGATIVE 01/22/2020 1500   LEUKOCYTESUR NEGATIVE 01/22/2020 1500    Radiological Exams on Admission: CT Angio Chest/Abd/Pel for Dissection W and/or Wo Contrast  Result Date: 05/15/2021 CLINICAL DATA:  Acute aortic  syndrome suspected, abdominal pain radiating to back and chest EXAM: CT ANGIOGRAPHY CHEST, ABDOMEN AND PELVIS TECHNIQUE: Multidetector CT imaging through the chest, abdomen and pelvis was performed using the standard protocol during bolus administration of intravenous contrast. Multiplanar reconstructed images and MIPs were obtained and reviewed to evaluate the vascular anatomy. CONTRAST:  OMNIPAQUE IOHEXOL 350 MG/ML SOLN COMPARISON:  01/22/2020 and previous FINDINGS: CTA CHEST FINDINGS Cardiovascular: Heart size normal. No pericardial effusion. Satisfactory opacification of pulmonary arteries noted, and there is no evidence of pulmonary emboli. Scattered coronary calcifications. Good contrast opacification of the thoracic aorta without dissection, aneurysm, or stenosis. Moderate calcified atheromatous plaque in the arch and descending thoracic segment. Broad 0.8 cm penetrating atheromatous ulcer in the lateral aspect of the aortic isthmus, stable since previous. Mediastinum/Nodes: No mediastinal hematoma, mass or adenopathy. Lungs/Pleura: No pleural effusion. No pneumothorax. Pulmonary emphysema. No nodule or airspace consolidation. Musculoskeletal: Anterior vertebral endplate spurring at multiple levels in the lower thoracic spine. Solid-appearing fusion C6-T1. Bilateral shoulder DJD. No fracture or worrisome bone lesion. Review of the MIP images confirms the above findings. CTA ABDOMEN AND PELVIS FINDINGS VASCULAR Aorta: Moderate calcified atheromatous plaque. No aneurysm, dissection or stenosis. 2 stable subcentimeter penetrating atheromatous ulcers in the right posterolateral immediate infrarenal segment. 0.6 cm left anterolateral penetrating atheromatous ulcer at the level of the IMA origin, incompletely visualized on the prior study. No evidence of intramural hematoma. Celiac: Patent without evidence of aneurysm, dissection, vasculitis or significant stenosis. SMA: Patent without evidence of aneurysm,  dissection, vasculitis or significant stenosis. Renals: Calcified plaque at bilateral renal artery origins without high-grade stenosis, vessels patent distally. IMA: Patent Inflow: Scattered calcified atheromatous plaque heaviest in the common iliac segments bilaterally, without high-grade stenosis or aneurysm. Veins: Atheromatous, patent. Review of the MIP images confirms the above findings. NON-VASCULAR Hepatobiliary: Stable probable cysts in segments 4A, 5/6, and 7. No calcified stones in the nondilated gallbladder. No biliary ductal dilatation. Pancreas: Unremarkable. No pancreatic ductal dilatation or surrounding inflammatory changes. Spleen: Normal in size without focal abnormality. Adrenals/Urinary Tract: Adrenal glands unremarkable. Mild left renal parenchymal loss. Small low-attenuation renal lesions bilaterally, statistically likely cysts but incompletely characterized. No hydronephrosis or urolithiasis. Urinary bladder incompletely distended. Stomach/Bowel: Stomach is distended by ingested fluid, otherwise unremarkable. A few distended mid abdominal small bowel loops with mild circumferential mucosal  enhancement but no wall thickening. The appendix is not identified. No pericecal inflammatory changes. The colon is non dilated with scattered distal descending and sigmoid diverticula; no adjacent inflammatory change. Lymphatic: No abdominal or pelvic adenopathy. Reproductive: Status post hysterectomy. No adnexal masses. Other: Small volume pelvic fluid. Left pelvic phlebolith. No abdominal ascites. No free air. Musculoskeletal: Lumbar facet DJD. No fracture or worrisome bone lesion. Review of the MIP images confirms the above findings. IMPRESSION: 1. 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 2. Mild distention of mid small bowel loops with mucosal enhancement, but no wall thickening or regional inflammatory changes, possibly  enteritis but nonspecific. 3. Widely patent celiac axis, SMA and IMA; proximal occlusive mesenteric ischemia unlikely. 4. Distal descending and sigmoid diverticulosis. Electronically Signed   By: Lucrezia Europe M.D.   On: 05/15/2021 11:04    EKG: Independently reviewed.  Sinus rhythm 83 bpm  Assessment/Plan Principal Problem:   Ileus (HCC)    Acute abdominal pain with nausea/vomiting -May be related to some diffuse trauma related to her large dog -Currently resolved -Abdominal imaging without any significant concerns at this time -Trial of soft diet and observation -If improved in a.m. may discharge  History of COPD -Without any bronchospasms -Breathing treatments as needed  Hypertension -Continue amlodipine  Dyslipidemia -Continue statin  PAD/prior CVA -Continue Plavix and statin   DVT prophylaxis: Lovenox Code Status: Full Family Communication: None at bedside Disposition Plan: Admit for observation of abdominal pain Consults called: None Admission status: Observation, MedSurg  Noralyn Karim D Sean Malinowski DO [facility]  If 7PM-7AM, please contact night-coverage [email or web address]  05/15/2021, 12:38 PM

## 2021-05-15 NOTE — ED Notes (Signed)
Pt ambulated to restroom without difficulties.  

## 2021-05-15 NOTE — ED Provider Notes (Signed)
[facility] Provider Note   CSN: [id number] Arrival date & time: 05/15/21  [id number]     History Chief Complaint  Patient presents with   Abdominal Pain    [name] is a 80 y.o. female here w/ abdominal pain.  She reports gradual onset of her pain last night while she was lying in bed.  It is a periumbilical pain, which is hard to describe, and is gradually began to radiate upwards towards her chest.  She has never had a before.  Nothing seems to make it better or worse.  It has been constant since last night.  It is currently 8 /10 pain.  She reports nausea and dry heaving earlier today.  She denies fevers or chills.  She reports he does smoke cigarettes, is a lifelong smoker, denies any known history of aneurysm  She does have a history of hepatitis  Per record review, patient last had imaging of abdoman in Jun 2019 noting vascular calcifications but no mention of AAA.  In July 2018 similar CT abdomen with normal aortic caliber noted.  HPI     Past Medical History:  Diagnosis Date   Arthritis    OA   GERD (gastroesophageal reflux disease)    Headache(784.0)    Hepatitis    history of Hepatitis 20 years ago; not sure what kind   History of gout    Hypertension    Hypertensive encephalopathy    Stroke ([place])    SLIGHT RT SIDE WEAKNESS 2001    Patient Active Problem List   Diagnosis Date Noted   Ileus ([place]) 05/15/2021   Back pain with sciatica 02/21/2021   Rash 11/21/2020   Hospital discharge follow-up 02/04/2020   PAD (peripheral artery disease) -Atherosclerotic ulcer of aorta -posterior aspect of the descending aorta measuring up to 4 mm in thickness 01/24/2020   COPD with acute exacerbation ([place]) 01/23/2020   Acute respiratory failure with hypoxia ([place]) 01/23/2020   Tobacco abuse 01/23/2020   Respiratory failure with hypoxia ([place]) 01/23/2020   Aspiration pneumonia ([place]) 01/22/2020   PAD/Atherosclerotic ulcer of aorta -posterior aspect of  the descending aorta measuring up to 4 mm in thickness 01/22/2020   GAD (generalized anxiety disorder) 08/26/2019   Osteopenia of multiple sites 08/26/2019   Depression, recurrent ([place]) 04/21/2019   Stage 3a chronic kidney disease ([place]) 04/21/2019   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   Hyperlipidemia 07/09/2016   History of TIA (transient ischemic attack) 03/06/2016   Cigarette nicotine dependence without complication [id number]   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[place]) 02/13/2012   Left-sided weakness 02/11/2012   Essential hypertension with goal blood pressure less than 130/80 02/11/2012   CALCANEAL SPUR 10/05/2009    Past Surgical History:  Procedure Laterality Date   ABDOMINAL HYSTERECTOMY     ANTERIOR CERVICAL CORPECTOMY  03/07/2012   Procedure: ANTERIOR CERVICAL CORPECTOMY;  Surgeon: Kristeen Miss, MD;  Location: [facility];  Service: Neurosurgery;  Laterality: N/A;  Cervical six-seven, cervical seven-thoracic one Anterior cervical decompression/diskectomy/fusion, with Cervical seven Corpectomy, reconstruction using Allograft and Alphatec plate   CATARACT EXTRACTION W/PHACO Left 04/12/2017   Procedure: CATARACT EXTRACTION PHACO AND INTRAOCULAR LENS PLACEMENT ([place]);  Surgeon: Baruch Goldmann, MD;  Location: [facility];  Service: Ophthalmology;  Laterality: Left;  CDE: 3.82   CATARACT EXTRACTION W/PHACO Right 05/03/2017   Procedure: CATARACT EXTRACTION PHACO AND INTRAOCULAR LENS PLACEMENT RIGHT EYE;  Surgeon: Baruch Goldmann, MD;  Location: [facility];  Service: Ophthalmology;  Laterality: Right;  CDE: 3.89   MULTIPLE TOOTH EXTRACTIONS     TONSILLECTOMY       OB History   No obstetric history on file.     Family History  Adopted: Yes  Problem Relation Age of Onset   Bipolar disorder Daughter    Heart disease Daughter    Asthma Daughter    Bipolar disorder Son    Bipolar disorder Daughter    Bipolar disorder  Daughter    Bipolar disorder Daughter    Hypertension Daughter    Heart disease Daughter    Drug abuse Daughter        OD    Social History   Tobacco Use   Smoking status: Every Day    Packs/day: 0.50    Years: 50.00    Pack years: 25.00    Types: Cigarettes   Smokeless tobacco: Never  Vaping Use   Vaping Use: Never used  Substance Use Topics   Alcohol use: Yes    Comment: occasional   Drug use: No    Home Medications Prior to Admission medications   Medication Sig Start Date End Date Taking? Authorizing Provider  amLODipine (NORVASC) 10 MG tablet Take 1 tablet (10 mg total) by mouth daily. 08/19/20  Yes Ivy Lynn, NP  atorvastatin (LIPITOR) 40 MG tablet Take 1 tablet (40 mg total) by mouth daily. 02/21/21  Yes Ivy Lynn, NP  budesonide-formoterol (SYMBICORT) 160-4.5 MCG/ACT inhaler Inhale 2 puffs into the lungs 2 (two) times daily. 02/21/21  Yes Ivy Lynn, NP  clopidogrel (PLAVIX) 75 MG tablet Take 1 tablet (75 mg total) by mouth daily. 08/19/20  Yes Ivy Lynn, NP  cyclobenzaprine (FLEXERIL) 5 MG tablet Take 1 tablet (5 mg total) by mouth 3 (three) times daily as needed for muscle spasms. 02/21/21  Yes Ivy Lynn, NP  meloxicam (MOBIC) 15 MG tablet Take 1 tablet (15 mg total) by mouth daily. 08/19/20  Yes Ivy Lynn, NP  PROAIR HFA 108 (90 Base) MCG/ACT inhaler Inhale 2 puffs into the lungs every 6 (six) hours as needed for wheezing or shortness of breath (cough). 02/21/21  Yes Ivy Lynn, NP    Allergies    Penicillins  Review of Systems   Review of Systems  Constitutional:  Negative for chills and fever.  HENT:  Negative for ear pain and sore throat.   Eyes:  Negative for pain and visual disturbance.  Respiratory:  Negative for cough and shortness of breath.   Cardiovascular:  Negative for chest pain and palpitations.  Gastrointestinal:  Positive for abdominal pain, nausea and vomiting.  Genitourinary:  Negative for dysuria and  hematuria.  Musculoskeletal:  Negative for arthralgias and back pain.  Skin:  Negative for color change and rash.  Neurological:  Negative for syncope, light-headedness and headaches.  All other systems reviewed and are negative.  Physical Exam Updated Vital Signs BP (!) 176/78    Pulse 79    Temp 99.1 F (37.3 C) (Oral)    Resp 17    Ht 5\' 1"  (1.549 m)    Wt 55.5 kg    SpO2 94%    BMI 23.13 kg/m   Physical Exam Constitutional:      General: She is not in acute distress. HENT:     Head: Normocephalic and atraumatic.  Eyes:     Conjunctiva/sclera: Conjunctivae normal.     Pupils: Pupils are equal, round, and reactive to light.  Cardiovascular:  Rate and Rhythm: Normal rate and regular rhythm.  Pulmonary:     Effort: Pulmonary effort is normal. No respiratory distress.  Abdominal:     General: There is no distension.     Tenderness: There is abdominal tenderness in the epigastric area and periumbilical area. There is no guarding or rebound. Negative signs include Murphy's sign.  Skin:    General: Skin is warm and dry.  Neurological:     General: No focal deficit present.     Mental Status: She is alert. Mental status is at baseline.  Psychiatric:        Mood and Affect: Mood normal.        Behavior: Behavior normal.    ED Results / Procedures / Treatments   Labs (all labs ordered are listed, but only abnormal results are displayed) Labs Reviewed  BASIC METABOLIC PANEL - Abnormal; Notable for the following components:      Result Value   Glucose, Bld 103 (*)    Calcium 8.8 (*)    All other components within normal limits  CBC WITH DIFFERENTIAL/PLATELET - Abnormal; Notable for the following components:   RBC 5.53 (*)    Hemoglobin 16.5 (*)    HCT 51.9 (*)    All other components within normal limits  RESP PANEL BY RT-PCR (FLU A&B, COVID) [id number]  HEPATIC FUNCTION PANEL  LIPASE, BLOOD    EKG EKG Interpretation  Date/Time:  Monday May 15 2021 09:08:48  EST Ventricular Rate:  83 PR Interval:  149 QRS Duration: 89 QT Interval:  407 QTC Calculation: 479 R Axis:   -68 Text Interpretation: Sinus rhythm Atrial premature complex Left anterior fascicular block Confirmed by Alvester Chou [phone]) on 05/15/2021 9:17:22 AM  Radiology CT Angio Chest/Abd/Pel for Dissection W and/or Wo Contrast  Result Date: 05/15/2021 CLINICAL DATA:  Acute aortic syndrome suspected, abdominal pain radiating to back and chest EXAM: CT ANGIOGRAPHY CHEST, ABDOMEN AND PELVIS TECHNIQUE: Multidetector CT imaging through the chest, abdomen and pelvis was performed using the standard protocol during bolus administration of intravenous contrast. Multiplanar reconstructed images and MIPs were obtained and reviewed to evaluate the vascular anatomy. CONTRAST:  OMNIPAQUE IOHEXOL 350 MG/ML SOLN COMPARISON:  01/22/2020 and previous FINDINGS: CTA CHEST FINDINGS Cardiovascular: Heart size normal. No pericardial effusion. Satisfactory opacification of pulmonary arteries noted, and there is no evidence of pulmonary emboli. Scattered coronary calcifications. Good contrast opacification of the thoracic aorta without dissection, aneurysm, or stenosis. Moderate calcified atheromatous plaque in the arch and descending thoracic segment. Broad 0.8 cm penetrating atheromatous ulcer in the lateral aspect of the aortic isthmus, stable since previous. Mediastinum/Nodes: No mediastinal hematoma, mass or adenopathy. Lungs/Pleura: No pleural effusion. No pneumothorax. Pulmonary emphysema. No nodule or airspace consolidation. Musculoskeletal: Anterior vertebral endplate spurring at multiple levels in the lower thoracic spine. Solid-appearing fusion C6-T1. Bilateral shoulder DJD. No fracture or worrisome bone lesion. Review of the MIP images confirms the above findings. CTA ABDOMEN AND PELVIS FINDINGS VASCULAR Aorta: Moderate calcified atheromatous plaque. No aneurysm, dissection or stenosis. 2 stable  subcentimeter penetrating atheromatous ulcers in the right posterolateral immediate infrarenal segment. 0.6 cm left anterolateral penetrating atheromatous ulcer at the level of the IMA origin, incompletely visualized on the prior study. No evidence of intramural hematoma. Celiac: Patent without evidence of aneurysm, dissection, vasculitis or significant stenosis. SMA: Patent without evidence of aneurysm, dissection, vasculitis or significant stenosis. Renals: Calcified plaque at bilateral renal artery origins without high-grade stenosis, vessels patent distally. IMA: Patent Inflow: Scattered calcified atheromatous  plaque heaviest in the common iliac segments bilaterally, without high-grade stenosis or aneurysm. Veins: Atheromatous, patent. Review of the MIP images confirms the above findings. NON-VASCULAR Hepatobiliary: Stable probable cysts in segments 4A, 5/6, and 7. No calcified stones in the nondilated gallbladder. No biliary ductal dilatation. Pancreas: Unremarkable. No pancreatic ductal dilatation or surrounding inflammatory changes. Spleen: Normal in size without focal abnormality. Adrenals/Urinary Tract: Adrenal glands unremarkable. Mild left renal parenchymal loss. Small low-attenuation renal lesions bilaterally, statistically likely cysts but incompletely characterized. No hydronephrosis or urolithiasis. Urinary bladder incompletely distended. Stomach/Bowel: Stomach is distended by ingested fluid, otherwise unremarkable. A few distended mid abdominal small bowel loops with mild circumferential mucosal enhancement but no wall thickening. The appendix is not identified. No pericecal inflammatory changes. The colon is non dilated with scattered distal descending and sigmoid diverticula; no adjacent inflammatory change. Lymphatic: No abdominal or pelvic adenopathy. Reproductive: Status post hysterectomy. No adnexal masses. Other: Small volume pelvic fluid. Left pelvic phlebolith. No abdominal ascites. No free  air. Musculoskeletal: Lumbar facet DJD. No fracture or worrisome bone lesion. Review of the MIP images confirms the above findings. IMPRESSION: 1. 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 2. Mild distention of mid small bowel loops with mucosal enhancement, but no wall thickening or regional inflammatory changes, possibly enteritis but nonspecific. 3. Widely patent celiac axis, SMA and IMA; proximal occlusive mesenteric ischemia unlikely. 4. Distal descending and sigmoid diverticulosis. Electronically Signed   By: Lucrezia Europe M.D.   On: 05/15/2021 11:04    Procedures Procedures   Medications Ordered in ED Medications  meloxicam (MOBIC) tablet 15 mg (15 mg Oral Given 05/15/21 1559)  amLODipine (NORVASC) tablet 10 mg (10 mg Oral Given 05/15/21 1343)  atorvastatin (LIPITOR) tablet 40 mg (has no administration in time range)  clopidogrel (PLAVIX) tablet 75 mg (75 mg Oral Given 05/15/21 1343)  cyclobenzaprine (FLEXERIL) tablet 5 mg (has no administration in time range)  mometasone-formoterol (DULERA) 200-5 MCG/ACT inhaler 2 puff (2 puffs Inhalation Given 05/15/21 1508)  albuterol (VENTOLIN HFA) 108 (90 Base) MCG/ACT inhaler 2 puff (has no administration in time range)  enoxaparin (LOVENOX) injection 40 mg (40 mg Subcutaneous Given 05/15/21 1343)  sodium chloride flush (NS) 0.9 % injection 3 mL (3 mLs Intravenous Given 05/15/21 1343)  sodium chloride flush (NS) 0.9 % injection 3 mL (has no administration in time range)  0.9 %  sodium chloride infusion (has no administration in time range)  acetaminophen (TYLENOL) tablet 650 mg (650 mg Oral Given 05/15/21 1343)    Or  acetaminophen (TYLENOL) suppository 650 mg ( Rectal See Alternative 05/15/21 1343)  ondansetron (ZOFRAN) tablet 4 mg (has no administration in time range)    Or  ondansetron (ZOFRAN) injection 4 mg (has no administration in time range)  pantoprazole (PROTONIX)  injection 40 mg (40 mg Intravenous Given 05/15/21 1343)  COVID-19 mRNA bivalent vaccine (Moderna) injection 0.5 mL (has no administration in time range)  morphine 4 MG/ML injection 4 mg (4 mg Intravenous Given 05/15/21 1011)  sodium chloride 0.9 % bolus 1,000 mL (0 mLs Intravenous Stopped 05/15/21 1159)  ondansetron (ZOFRAN) injection 4 mg (4 mg Intravenous Given 05/15/21 1009)  iohexol (OMNIPAQUE) 350 MG/ML injection 100 mL (100 mLs Intravenous Contrast Given 05/15/21 1040)    ED Course  I have reviewed the triage vital signs and the nursing notes.  Pertinent labs & imaging results that were available during my care of the patient were reviewed by me and considered in  my medical decision making (see chart for details).  This patient presents to the Emergency Department with complaint of abdominal pain. This involves an extensive number of treatment options, and is a complaint that carries with it a high risk of complications and morbidity.  The differential diagnosis includes, but is not limited to, gastritis vs biliary disease vs peptic ulcer vs colitis vs UTI vs aortic pathology vs other  Given her hx of smoking and clinical presentation and location of her symptoms, CTA chest/abd/pelvis ordered to evaluate for AAA  I ordered, reviewed, and interpreted labs, including BMP and CBC.  There were no immediate, life-threatening emergencies found in this labwork.  Patient's UA showed no signs of infection I ordered medication Morphine, zofran for abdominal pain and/or nausea I ordered imaging studies which included CTA chest/abd/pelvis I independently visualized and interpreted imaging which showed gastri distension, aortic ulcerations, and the monitor tracing which showed NSR Previous records obtained and reviewed showing CT imaging from 2018-2019 with no mention of AAA (care everywhere) I personally reviewed the patients ECG which showed sinus rhythm with no acute ischemic  findings    Clinical Course as of 05/15/21 1710  Mon May 15, 2021  1154 Ct reviewed - suspect this may be ileus vs partial SBO given her repetitive vomiting this morning.  No evidence of high-grade SBO.  Pt aware of aortic ulcerations - has seen doctor for this in the past; I doubt this is causing her GI symptoms.  No AAA/dissection.  Will admit for observation/pain and nausea control [MT]  1215 Admitted to hospitalist [MT]    Clinical Course User Index [MT] Charish Schroepfer, Carola Rhine, MD     Final Clinical Impression(s) / ED Diagnoses Final diagnoses:  None    Rx / DC Orders ED Discharge Orders     None        Wyvonnia Dusky, MD 05/15/21 1710

## 2021-05-15 NOTE — ED Notes (Signed)
Patient transported to CT 

## 2021-05-15 NOTE — ED Triage Notes (Signed)
Pt arrived via EMS with complaints of upper GI pain that radiates towards central chest. Pt stated that the pain started around 10:00pm last night. Last BM yesterday.

## 2021-05-15 NOTE — ED Notes (Signed)
Pt returned from CT °

## 2021-05-15 NOTE — ED Notes (Signed)
Upon assessment pt O2 89% on RA. Pt placed on 2L [place]. O2 94%.

## 2021-05-16 ENCOUNTER — Other Ambulatory Visit: Payer: Self-pay | Admitting: Nurse Practitioner

## 2021-05-16 DIAGNOSIS — K567 Ileus, unspecified: Secondary | ICD-10-CM | POA: Diagnosis not present

## 2021-05-16 DIAGNOSIS — M549 Dorsalgia, unspecified: Secondary | ICD-10-CM

## 2021-05-16 NOTE — Discharge Summary (Signed)
Physician Discharge Summary  [name] [id number] DOB: [birth year] DOA: 05/15/2021  PCP: Claretta Fraise, MD  Admit date: 05/15/2021  Discharge date: 05/16/2021  Admitted From:Home  Disposition:  Home  Recommendations for Outpatient Follow-up:  Follow up with PCP in 1-2 weeks Continue home medications as prior  Home Health:None  Equipment/Devices:None  Discharge Condition:Stable  CODE STATUS: Full  Diet recommendation: Heart Healthy  Brief/Interim Summary: [name] is a 80 y.o. female with medical history significant for COPD, hypertension, dyslipidemia, PAD, prior CVA, and prior hysterectomy who presented to the ED with complaints of sudden onset epigastric abdominal pain with radiation to her chest that began at 10 PM last night.  She states she did this began shortly after her large dog ran into her belly earlier that evening.  She claims to have had several episodes of nausea and vomiting as well with no hematemesis.  Patient had no significant findings on her CT imaging and labs were within normal limits.  She was offered discharge at that time, but felt very uncomfortable about going home and therefore was kept overnight in observation.  She has tolerated her diet and has had no further issues or concerns with any nausea or vomiting or abdominal pain.  She is stable for discharge and eager to go home.  No other acute events noted throughout the course of this hospitalization.  Discharge Diagnoses:  Principal Problem:   Ileus [facility])  Principal discharge diagnosis: Acute abdominal pain likely secondary to mild trauma related to her dog at home.  Discharge Instructions  Discharge Instructions     Diet - low sodium heart healthy   Complete by: As directed    Increase activity slowly   Complete by: As directed       Allergies as of 05/16/2021       Reactions   Penicillins Anaphylaxis, Swelling, Rash, Other (See Comments)   Has patient had a PCN  reaction causing immediate rash, facial/tongue/throat swelling, SOB or lightheadedness with hypotension: yes Has patient had a PCN reaction causing severe rash involving mucus membranes or skin necrosis: no Has patient had a PCN reaction that required hospitalization: yes Has patient had a PCN reaction occurring within the last 10 years: no If all of the above answers are "NO", then may proceed with Cephalosporin use.        Medication List     TAKE these medications    amLODipine 10 MG tablet Commonly known as: NORVASC Take 1 tablet (10 mg total) by mouth daily.   atorvastatin 40 MG tablet Commonly known as: LIPITOR Take 1 tablet (40 mg total) by mouth daily.   budesonide-formoterol 160-4.5 MCG/ACT inhaler Commonly known as: Symbicort Inhale 2 puffs into the lungs 2 (two) times daily.   clopidogrel 75 MG tablet Commonly known as: PLAVIX Take 1 tablet (75 mg total) by mouth daily.   cyclobenzaprine 5 MG tablet Commonly known as: FLEXERIL Take 1 tablet (5 mg total) by mouth 3 (three) times daily as needed for muscle spasms.   meloxicam 15 MG tablet Commonly known as: MOBIC Take 1 tablet (15 mg total) by mouth daily.   ProAir HFA 108 (90 Base) MCG/ACT inhaler Generic drug: albuterol Inhale 2 puffs into the lungs every 6 (six) hours as needed for wheezing or shortness of breath (cough).        Follow-up Information     Stacks, Cletus Gash, MD. Schedule an appointment as soon as possible for a visit in 1 week(s).   Specialty:  Family Medicine Contact information: [address] [place] [address] [phone]                Allergies  Allergen Reactions   Penicillins Anaphylaxis, Swelling, Rash and Other (See Comments)    Has patient had a PCN reaction causing immediate rash, facial/tongue/throat swelling, SOB or lightheadedness with hypotension: yes Has patient had a PCN reaction causing severe rash involving mucus membranes or skin necrosis: no Has patient  had a PCN reaction that required hospitalization: yes Has patient had a PCN reaction occurring within the last 10 years: no If all of the above answers are "NO", then may proceed with Cephalosporin use.     Consultations: None   Procedures/Studies: MM 3D SCREEN BREAST BILATERAL  Result Date: 04/27/2021 CLINICAL DATA:  Screening. EXAM: DIGITAL SCREENING BILATERAL MAMMOGRAM WITH TOMOSYNTHESIS AND CAD TECHNIQUE: Bilateral screening digital craniocaudal and mediolateral oblique mammograms were obtained. Bilateral screening digital breast tomosynthesis was performed. The images were evaluated with computer-aided detection. COMPARISON:  Previous exam(s). ACR Breast Density Category b: There are scattered areas of fibroglandular density. FINDINGS: There are no findings suspicious for malignancy. IMPRESSION: No mammographic evidence of malignancy. A result letter of this screening mammogram will be mailed directly to the patient. RECOMMENDATION: Screening mammogram in one year. (Code:[id number]) BI-RADS CATEGORY  1: Negative. Electronically Signed   By: Sherian Rein M.D.   On: 04/27/2021 11:13   CT Angio Chest/Abd/Pel for Dissection W and/or Wo Contrast  Result Date: 05/15/2021 CLINICAL DATA:  Acute aortic syndrome suspected, abdominal pain radiating to back and chest EXAM: CT ANGIOGRAPHY CHEST, ABDOMEN AND PELVIS TECHNIQUE: Multidetector CT imaging through the chest, abdomen and pelvis was performed using the standard protocol during bolus administration of intravenous contrast. Multiplanar reconstructed images and MIPs were obtained and reviewed to evaluate the vascular anatomy. CONTRAST:  OMNIPAQUE IOHEXOL 350 MG/ML SOLN COMPARISON:  01/22/2020 and previous FINDINGS: CTA CHEST FINDINGS Cardiovascular: Heart size normal. No pericardial effusion. Satisfactory opacification of pulmonary arteries noted, and there is no evidence of pulmonary emboli. Scattered coronary calcifications. Good contrast  opacification of the thoracic aorta without dissection, aneurysm, or stenosis. Moderate calcified atheromatous plaque in the arch and descending thoracic segment. Broad 0.8 cm penetrating atheromatous ulcer in the lateral aspect of the aortic isthmus, stable since previous. Mediastinum/Nodes: No mediastinal hematoma, mass or adenopathy. Lungs/Pleura: No pleural effusion. No pneumothorax. Pulmonary emphysema. No nodule or airspace consolidation. Musculoskeletal: Anterior vertebral endplate spurring at multiple levels in the lower thoracic spine. Solid-appearing fusion C6-T1. Bilateral shoulder DJD. No fracture or worrisome bone lesion. Review of the MIP images confirms the above findings. CTA ABDOMEN AND PELVIS FINDINGS VASCULAR Aorta: Moderate calcified atheromatous plaque. No aneurysm, dissection or stenosis. 2 stable subcentimeter penetrating atheromatous ulcers in the right posterolateral immediate infrarenal segment. 0.6 cm left anterolateral penetrating atheromatous ulcer at the level of the IMA origin, incompletely visualized on the prior study. No evidence of intramural hematoma. Celiac: Patent without evidence of aneurysm, dissection, vasculitis or significant stenosis. SMA: Patent without evidence of aneurysm, dissection, vasculitis or significant stenosis. Renals: Calcified plaque at bilateral renal artery origins without high-grade stenosis, vessels patent distally. IMA: Patent Inflow: Scattered calcified atheromatous plaque heaviest in the common iliac segments bilaterally, without high-grade stenosis or aneurysm. Veins: Atheromatous, patent. Review of the MIP images confirms the above findings. NON-VASCULAR Hepatobiliary: Stable probable cysts in segments 4A, 5/6, and 7. No calcified stones in the nondilated gallbladder. No biliary ductal dilatation. Pancreas: Unremarkable. No pancreatic ductal dilatation or surrounding  inflammatory changes. Spleen: Normal in size without focal abnormality.  Adrenals/Urinary Tract: Adrenal glands unremarkable. Mild left renal parenchymal loss. Small low-attenuation renal lesions bilaterally, statistically likely cysts but incompletely characterized. No hydronephrosis or urolithiasis. Urinary bladder incompletely distended. Stomach/Bowel: Stomach is distended by ingested fluid, otherwise unremarkable. A few distended mid abdominal small bowel loops with mild circumferential mucosal enhancement but no wall thickening. The appendix is not identified. No pericecal inflammatory changes. The colon is non dilated with scattered distal descending and sigmoid diverticula; no adjacent inflammatory change. Lymphatic: No abdominal or pelvic adenopathy. Reproductive: Status post hysterectomy. No adnexal masses. Other: Small volume pelvic fluid. Left pelvic phlebolith. No abdominal ascites. No free air. Musculoskeletal: Lumbar facet DJD. No fracture or worrisome bone lesion. Review of the MIP images confirms the above findings. IMPRESSION: 1. 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 2. Mild distention of mid small bowel loops with mucosal enhancement, but no wall thickening or regional inflammatory changes, possibly enteritis but nonspecific. 3. Widely patent celiac axis, SMA and IMA; proximal occlusive mesenteric ischemia unlikely. 4. Distal descending and sigmoid diverticulosis. Electronically Signed   By: Lucrezia Europe M.D.   On: 05/15/2021 11:04     Discharge Exam: Vitals:   05/16/21 0440 05/16/21 0723  BP: 137/76   Pulse: 68   Resp:    Temp: 98.7 F (37.1 C)   SpO2: 97% 97%   Vitals:   05/15/21 2104 05/16/21 0108 05/16/21 0440 05/16/21 0723  BP: (!) 121/51 138/61 137/76   Pulse: 71 73 68   Resp: 18 18    Temp: 98.1 F (36.7 C) 98.2 F (36.8 C) 98.7 F (37.1 C)   TempSrc: Oral Oral Oral   SpO2: 97% 99% 97% 97%  Weight:      Height:        General: Pt is alert, awake, not in  acute distress Cardiovascular: RRR, S1/S2 +, no rubs, no gallops Respiratory: CTA bilaterally, no wheezing, no rhonchi Abdominal: Soft, NT, ND, bowel sounds + Extremities: no edema, no cyanosis    The results of significant diagnostics from this hospitalization (including imaging, microbiology, ancillary and laboratory) are listed below for reference.     Microbiology: Recent Results (from the past 240 hour(s))  Resp Panel by RT-PCR (Flu A&B, Covid) Nasopharyngeal Swab     Status: None   Collection Time: 05/15/21 12:26 PM   Specimen: Nasopharyngeal Swab; Nasopharyngeal(NP) swabs in vial transport medium  Result Value Ref Range Status   SARS Coronavirus 2 by RT PCR NEGATIVE NEGATIVE Final    Comment: (NOTE) SARS-CoV-2 target nucleic acids are NOT DETECTED.  The SARS-CoV-2 RNA is generally detectable in upper respiratory specimens during the acute phase of infection. The lowest concentration of SARS-CoV-2 viral copies this assay can detect is 138 copies/mL. A negative result does not preclude SARS-Cov-2 infection and should not be used as the sole basis for treatment or other patient management decisions. A negative result may occur with  improper specimen collection/handling, submission of specimen other than nasopharyngeal swab, presence of viral mutation(s) within the areas targeted by this assay, and inadequate number of viral copies(<138 copies/mL). A negative result must be combined with clinical observations, patient history, and epidemiological information. The expected result is Negative.  Fact Sheet for Patients:  EntrepreneurPulse.com.au  Fact Sheet for Healthcare Providers:  IncredibleEmployment.be  This test is no t yet approved or cleared by the [place] FDA and  has been authorized for detection and/or diagnosis  of SARS-CoV-2 by FDA under an Emergency Use Authorization (EUA). This EUA will remain  in effect (meaning this  test can be used) for the duration of the COVID-19 declaration under Section 564(b)(1) of the Act, 21 U.S.C.section 360bbb-3(b)(1), unless the authorization is terminated  or revoked sooner.       Influenza A by PCR NEGATIVE NEGATIVE Final   Influenza B by PCR NEGATIVE NEGATIVE Final    Comment: (NOTE) The Xpert Xpress SARS-CoV-2/FLU/RSV plus assay is intended as an aid in the diagnosis of influenza from Nasopharyngeal swab specimens and should not be used as a sole basis for treatment. Nasal washings and aspirates are unacceptable for Xpert Xpress SARS-CoV-2/FLU/RSV testing.  Fact Sheet for Patients: EntrepreneurPulse.com.au  Fact Sheet for Healthcare Providers: IncredibleEmployment.be  This test is not yet approved or cleared by the [place] FDA and has been authorized for detection and/or diagnosis of SARS-CoV-2 by FDA under an Emergency Use Authorization (EUA). This EUA will remain in effect (meaning this test can be used) for the duration of the COVID-19 declaration under Section 564(b)(1) of the Act, 21 U.S.C. section 360bbb-3(b)(1), unless the authorization is terminated or revoked.  Performed at [facility], [address] [place], [place] [address]      Labs: BNP (last 3 results) No results for input(s): BNP in the last 8760 hours. Basic Metabolic Panel: Recent Labs  Lab 05/15/21 0925  NA 140  K 4.1  CL 102  CO2 28  GLUCOSE 103*  BUN 16  CREATININE 0.91  CALCIUM 8.8*   Liver Function Tests: Recent Labs  Lab 05/15/21 0925  AST 21  ALT 13  ALKPHOS 51  BILITOT 0.8  PROT 7.4  ALBUMIN 4.0   Recent Labs  Lab 05/15/21 0925  LIPASE 31   No results for input(s): AMMONIA in the last 168 hours. CBC: Recent Labs  Lab 05/15/21 0925  WBC 8.1  NEUTROABS 6.5  HGB 16.5*  HCT 51.9*  MCV 93.9  PLT 179   Cardiac Enzymes: No results for input(s): CKTOTAL, CKMB, CKMBINDEX, TROPONINI in the last 168  hours. BNP: Invalid input(s): POCBNP CBG: No results for input(s): GLUCAP in the last 168 hours. D-Dimer No results for input(s): DDIMER in the last 72 hours. Hgb A1c No results for input(s): HGBA1C in the last 72 hours. Lipid Profile No results for input(s): CHOL, HDL, LDLCALC, TRIG, CHOLHDL, LDLDIRECT in the last 72 hours. Thyroid function studies No results for input(s): TSH, T4TOTAL, T3FREE, THYROIDAB in the last 72 hours.  Invalid input(s): FREET3 Anemia work up No results for input(s): VITAMINB12, FOLATE, FERRITIN, TIBC, IRON, RETICCTPCT in the last 72 hours. Urinalysis    Component Value Date/Time   COLORURINE YELLOW 01/22/2020 1500   APPEARANCEUR CLEAR 01/22/2020 1500   LABSPEC 1.045 (H) 01/22/2020 1500   PHURINE 5.0 01/22/2020 1500   GLUCOSEU NEGATIVE 01/22/2020 1500   HGBUR NEGATIVE 01/22/2020 1500   BILIRUBINUR NEGATIVE 01/22/2020 1500   KETONESUR 5 (A) 01/22/2020 1500   PROTEINUR NEGATIVE 01/22/2020 1500   UROBILINOGEN 0.2 02/11/2012 1600   NITRITE NEGATIVE 01/22/2020 1500   LEUKOCYTESUR NEGATIVE 01/22/2020 1500   Sepsis Labs Invalid input(s): PROCALCITONIN,  WBC,  LACTICIDVEN Microbiology Recent Results (from the past 240 hour(s))  Resp Panel by RT-PCR (Flu A&B, Covid) Nasopharyngeal Swab     Status: None   Collection Time: 05/15/21 12:26 PM   Specimen: Nasopharyngeal Swab; Nasopharyngeal(NP) swabs in vial transport medium  Result Value Ref Range Status   SARS Coronavirus 2 by RT PCR  NEGATIVE NEGATIVE Final    Comment: (NOTE) SARS-CoV-2 target nucleic acids are NOT DETECTED.  The SARS-CoV-2 RNA is generally detectable in upper respiratory specimens during the acute phase of infection. The lowest concentration of SARS-CoV-2 viral copies this assay can detect is 138 copies/mL. A negative result does not preclude SARS-Cov-2 infection and should not be used as the sole basis for treatment or other patient management decisions. A negative result may occur  with  improper specimen collection/handling, submission of specimen other than nasopharyngeal swab, presence of viral mutation(s) within the areas targeted by this assay, and inadequate number of viral copies(<138 copies/mL). A negative result must be combined with clinical observations, patient history, and epidemiological information. The expected result is Negative.  Fact Sheet for Patients:  EntrepreneurPulse.com.au  Fact Sheet for Healthcare Providers:  IncredibleEmployment.be  This test is no t yet approved or cleared by the [place] FDA and  has been authorized for detection and/or diagnosis of SARS-CoV-2 by FDA under an Emergency Use Authorization (EUA). This EUA will remain  in effect (meaning this test can be used) for the duration of the COVID-19 declaration under Section 564(b)(1) of the Act, 21 U.S.C.section 360bbb-3(b)(1), unless the authorization is terminated  or revoked sooner.       Influenza A by PCR NEGATIVE NEGATIVE Final   Influenza B by PCR NEGATIVE NEGATIVE Final    Comment: (NOTE) The Xpert Xpress SARS-CoV-2/FLU/RSV plus assay is intended as an aid in the diagnosis of influenza from Nasopharyngeal swab specimens and should not be used as a sole basis for treatment. Nasal washings and aspirates are unacceptable for Xpert Xpress SARS-CoV-2/FLU/RSV testing.  Fact Sheet for Patients: EntrepreneurPulse.com.au  Fact Sheet for Healthcare Providers: IncredibleEmployment.be  This test is not yet approved or cleared by the [place] FDA and has been authorized for detection and/or diagnosis of SARS-CoV-2 by FDA under an Emergency Use Authorization (EUA). This EUA will remain in effect (meaning this test can be used) for the duration of the COVID-19 declaration under Section 564(b)(1) of the Act, 21 U.S.C. section 360bbb-3(b)(1), unless the authorization is terminated  or revoked.  Performed at [facility], [address] [place], [place] [address]      Time coordinating discharge: 35 minutes  SIGNED:   Rodena Goldmann, DO [facility] 05/16/2021, 9:15 AM  If 7PM-7AM, please contact night-coverage [email or web address]

## 2021-05-16 NOTE — Progress Notes (Signed)
Having no abd pain and had normal bm yesterday.  Ambulating in room and ready to go home.  Does not want to wait in covid vaccine to come from pharmacy and will get her fourth Moderna outpatient.  IV  removed and discharge instructions reviewed.  No new meds.  Family in room and to give ride home.  Walked to [employer] with family.

## 2021-05-17 ENCOUNTER — Telehealth: Payer: Self-pay

## 2021-05-17 NOTE — Telephone Encounter (Signed)
Transition Care Management Follow-up Telephone Call Date of discharge and from where: [name] 05/16[facility]21 Dx Ileus How have you been since you were released from the hospital? Feeling better just weak but in no pain  Any questions or concerns? No  Items Reviewed: Did the pt receive and understand the discharge instructions provided? Yes  Medications obtained and verified? Yes  Other? No  Any new allergies since your discharge? No  Dietary orders reviewed? Yes Do you have support at home? Yes   Home Care and Equipment/Supplies: Were home health services ordered? no  Were any new equipment or medical supplies ordered?  No  Functional Questionnaire: (I = Independent and D = Dependent) ADLs: I  Bathing/Dressing- I  Meal Prep- I  Eating- I  Maintaining continence- I  Transferring/Ambulation- I  Managing Meds- I  Follow up appointments reviewed:  PCP Hospital f/u appt confirmed? Yes  Scheduled to see Dr Darlyn Read on 05-23-21 @ 910am.  Are transportation arrangements needed? No  If their condition worsens, is the pt aware to call PCP or go to the Emergency Dept.? Yes Was the patient provided with contact information for the PCP's office or ED? Yes Was to pt encouraged to call back with questions or concerns? Yes

## 2021-05-23 ENCOUNTER — Encounter: Payer: Self-pay | Admitting: Family Medicine

## 2021-05-23 ENCOUNTER — Ambulatory Visit (INDEPENDENT_AMBULATORY_CARE_PROVIDER_SITE_OTHER): Payer: Medicare Other | Admitting: Family Medicine

## 2021-05-23 VITALS — BP 151/74 | HR 88 | Temp 97.9°F | Ht 61.0 in | Wt 122.6 lb

## 2021-05-23 DIAGNOSIS — I10 Essential (primary) hypertension: Secondary | ICD-10-CM

## 2021-05-23 DIAGNOSIS — K567 Ileus, unspecified: Secondary | ICD-10-CM | POA: Diagnosis not present

## 2021-05-23 NOTE — Progress Notes (Signed)
Subjective:  Patient ID: [name], female    DOB: 10-02-1940  Age: 80 y.o. MRN: [id number]  CC: Transitions Of Care   HPI ALIZAH PION presents for follow up of hospitalization for ileus on 12/19-20. Still having epigastric pain, dull ache, but much improved and the bowels are moving as usual. Not loose or hard. No straining. Not passsing any blood. Denies melena. No nausea or vomiting. Back to normal routine. Pain doesn't interefere with activities.  Appetite is normal. Eating a lot of chicken.  Taking BP meds. Readings not usually high.   Depression screen [facility] 2/9 05/23/2021 01/27/2021 11/21/2020  Decreased Interest 2 0 0  Down, Depressed, Hopeless 1 0 0  PHQ - 2 Score 3 0 0  Altered sleeping 1 - -  Tired, decreased energy 2 - -  Change in appetite 0 - -  Feeling bad or failure about yourself  0 - -  Trouble concentrating 1 - -  Moving slowly or fidgety/restless 0 - -  Suicidal thoughts 0 - -  PHQ-9 Score 7 - -  Difficult doing work/chores Somewhat difficult - -    [place] has a past medical history of Arthritis, GERD (gastroesophageal reflux disease), Headache(784.0), Hepatitis, History of gout, Hypertension, Hypertensive encephalopathy, and Stroke ([place]).   She has a past surgical history that includes Abdominal hysterectomy; Anterior cervical corpectomy (03/07/2012); Tonsillectomy; Multiple tooth extractions; Cataract extraction w/PHACO (Left, 04/12/2017); and Cataract extraction w/PHACO (Right, 05/03/2017).   Her family history includes Asthma in her daughter; Bipolar disorder in her daughter, daughter, daughter, daughter, and son; Drug abuse in her daughter; Heart disease in her daughter and daughter; Hypertension in her daughter. She was adopted.She reports that she has been smoking cigarettes. She has a 25.00 pack-year smoking history. She has never used smokeless tobacco. She reports current alcohol use. She reports that she does not use  drugs.    ROS Review of Systems  Constitutional: Negative.   HENT: Negative.    Eyes:  Negative for visual disturbance.  Respiratory:  Negative for shortness of breath.   Cardiovascular:  Negative for chest pain.  Gastrointestinal:  Negative for abdominal pain.  Musculoskeletal:  Negative for arthralgias.   Objective:  BP (!) 151/74    Pulse 88    Temp 97.9 F (36.6 C)    Ht 5\' 1"  (1.549 m)    Wt 122 lb 9.6 oz (55.6 kg)    SpO2 97%    BMI 23.17 kg/m   BP Readings from Last 3 Encounters:  05/23/21 (!) 151/74  05/16/21 137/76  02/21/21 136/79    Wt Readings from Last 3 Encounters:  05/23/21 122 lb 9.6 oz (55.6 kg)  05/15/21 122 lb 6.4 oz (55.5 kg)  02/21/21 121 lb 12.8 oz (55.2 kg)     Physical Exam Constitutional:      Appearance: She is well-developed.  HENT:     Head: Normocephalic and atraumatic.  Cardiovascular:     Rate and Rhythm: Normal rate and regular rhythm.     Heart sounds: No murmur heard. Pulmonary:     Effort: Pulmonary effort is normal.     Breath sounds: Normal breath sounds.  Abdominal:     General: Bowel sounds are normal.     Palpations: Abdomen is soft. There is no mass.     Tenderness: There is abdominal tenderness (mild, epigastric). There is no guarding or rebound. Negative signs include Murphy's sign and McBurney's sign.  Skin:    General: Skin  is warm and dry.  Neurological:     Mental Status: She is alert and oriented to person, place, and time.  Psychiatric:        Behavior: Behavior normal.      Assessment & Plan:   Sanvika was seen today for transitions of care.  Diagnoses and all orders for this visit:  Ileus (HCC)  Essential hypertension with goal blood pressure less than 130/80       I am having [name] maintain her amLODipine, meloxicam, clopidogrel, ProAir HFA, budesonide-formoterol, atorvastatin, and cyclobenzaprine.  Allergies as of 05/23/2021       Reactions   Penicillins Anaphylaxis, Swelling,  Rash, Other (See Comments)   Has patient had a PCN reaction causing immediate rash, facial/tongue/throat swelling, SOB or lightheadedness with hypotension: yes Has patient had a PCN reaction causing severe rash involving mucus membranes or skin necrosis: no Has patient had a PCN reaction that required hospitalization: yes Has patient had a PCN reaction occurring within the last 10 years: no If all of the above answers are "NO", then may proceed with Cephalosporin use.        Medication List        Accurate as of May 23, 2021  9:46 AM. If you have any questions, ask your nurse or doctor.          amLODipine 10 MG tablet Commonly known as: NORVASC Take 1 tablet (10 mg total) by mouth daily.   atorvastatin 40 MG tablet Commonly known as: LIPITOR Take 1 tablet (40 mg total) by mouth daily.   budesonide-formoterol 160-4.5 MCG/ACT inhaler Commonly known as: Symbicort Inhale 2 puffs into the lungs 2 (two) times daily.   clopidogrel 75 MG tablet Commonly known as: PLAVIX Take 1 tablet (75 mg total) by mouth daily.   cyclobenzaprine 5 MG tablet Commonly known as: FLEXERIL TAKE ONE TABLET THREE TIMES DAILY AS NEEDED FOR MUSCLE SPASMS   meloxicam 15 MG tablet Commonly known as: MOBIC Take 1 tablet (15 mg total) by mouth daily.   ProAir HFA 108 (90 Base) MCG/ACT inhaler Generic drug: albuterol Inhale 2 puffs into the lungs every 6 (six) hours as needed for wheezing or shortness of breath (cough).       Pt. Encouraged to check BP at rest, at home, periodically. Also offered for her to have triage check it for her. She should return if it doesn't improve as expected.  Follow-up: Return in about 1 month (around 06/23/2021).  Mechele Claude, M.D.

## 2021-07-25 ENCOUNTER — Other Ambulatory Visit: Payer: Self-pay | Admitting: Family Medicine

## 2021-07-25 DIAGNOSIS — M5430 Sciatica, unspecified side: Secondary | ICD-10-CM

## 2021-07-26 ENCOUNTER — Telehealth: Payer: Self-pay

## 2021-07-26 NOTE — Telephone Encounter (Signed)
Key: [id number]  ? ?- PA Case ID: [id number]  ? ?- Rx #: [id number] ?Need help? Call us at [phone] ? ?Status ?Sent to Plan today ? ?Drug ?Ventolin HFA 108 (90 Base)MCG/ACT aerosol ?Form ?[employer] Medicare Part D Electronic Prior Authorization Form (2017 NCPDP) ?Original Claim Info ?[phone] Provide Exception Process Printed NoticeTransition max 30 day unless unbreakableNon-Form, Dr. Augustine Radar ePA at [email or web address] CMS Transition days supply is0.ALBUTEROL HFA (PROAIR);PROAIR RESPICLICK;LEVALBUTEROL HFA [id number]: ?

## 2021-07-28 NOTE — Telephone Encounter (Signed)
Ventolin HFA is denied because it is not on your plan's Drug List (formulary). Medication ?authorization requires the following: ?(1) You need to try this covered drug: Levalbuterol HFA; OR ?(2) your doctor needs to give [place] specific medical reasons why the covered drug is not ?appropriate for you. ?Reviewed by: Prescilla Sours ?

## 2021-07-30 ENCOUNTER — Other Ambulatory Visit: Payer: Self-pay | Admitting: Family Medicine

## 2021-07-30 MED ORDER — LEVALBUTEROL TARTRATE 45 MCG/ACT IN AERO: 2.0000 | INHALATION_SPRAY | RESPIRATORY_TRACT | 12 refills | Status: DC | PRN

## 2021-07-30 NOTE — Telephone Encounter (Signed)
Substitute sent to [facility]. It's actually a better medication ?

## 2021-07-31 NOTE — Telephone Encounter (Signed)
Patient notified

## 2021-08-03 ENCOUNTER — Encounter: Payer: Self-pay | Admitting: Family Medicine

## 2021-08-03 ENCOUNTER — Ambulatory Visit (INDEPENDENT_AMBULATORY_CARE_PROVIDER_SITE_OTHER): Payer: Medicare Other | Admitting: Family Medicine

## 2021-08-03 VITALS — BP 135/80 | HR 91 | Temp 98.5°F | Ht 61.0 in | Wt 120.8 lb

## 2021-08-03 DIAGNOSIS — F339 Major depressive disorder, recurrent, unspecified: Secondary | ICD-10-CM

## 2021-08-03 DIAGNOSIS — J449 Chronic obstructive pulmonary disease, unspecified: Secondary | ICD-10-CM

## 2021-08-03 DIAGNOSIS — I10 Essential (primary) hypertension: Secondary | ICD-10-CM | POA: Diagnosis not present

## 2021-08-03 DIAGNOSIS — F411 Generalized anxiety disorder: Secondary | ICD-10-CM

## 2021-08-03 MED ORDER — DULOXETINE HCL 30 MG PO CPEP: 30.0000 mg | ORAL_CAPSULE | Freq: Every day | ORAL | 3 refills | Status: DC

## 2021-08-03 NOTE — Progress Notes (Signed)
? ?Subjective:  ?Patient ID: [name], female    DOB: November 04, 1940  Age: 81 y.o. MRN: [id number] ? ?CC: Anxiety ? ? ?HPI ?[name] presents for worsening anxiety. Has been very anxious. Gets jitters/ tremors. Concerned it could be Parkinson's. Recent change in inhaler has her confused. Switched from albuterol to lev albuterol due to insurance coverage.  ? ?PHQ & GAD reviewed as below ? ? ? ?Depression screen [facility] 2/9 08/03/2021 05/23/2021 01/27/2021  ?Decreased Interest 1 2 0  ?Down, Depressed, Hopeless 2 1 0  ?PHQ - 2 Score 3 3 0  ?Altered sleeping 2 1 -  ?Tired, decreased energy 2 2 -  ?Change in appetite 2 0 -  ?Feeling bad or failure about yourself  0 0 -  ?Trouble concentrating 2 1 -  ?Moving slowly or fidgety/restless 2 0 -  ?Suicidal thoughts 0 0 -  ?PHQ-9 Score 13 7 -  ?Difficult doing work/chores Somewhat difficult Somewhat difficult -  ?Some recent data might be hidden  ? ?GAD 7 : Generalized Anxiety Score 08/03/2021 05/23/2021 03/22/2020 02/04/2020  ?Nervous, Anxious, on Edge 3 1 2 3   ?Control/stop worrying 0 2 0 2  ?Worry too much - different things 0 2 0 3  ?Trouble relaxing 3 2 0 2  ?Restless 2 1 0 2  ?Easily annoyed or irritable 3 3 2 2   ?Afraid - awful might happen 0 2 0 0  ?Total GAD 7 Score 11 13 4 14   ?Anxiety Difficulty Somewhat difficult Somewhat difficult - Somewhat difficult  ? ? ? ?History ?[name] has a past medical history of Arthritis, GERD (gastroesophageal reflux disease), Headache(784.0), Hepatitis, History of gout, Hypertension, Hypertensive encephalopathy, and Stroke (HCC).  ? ?[name] has a past surgical history that includes Abdominal hysterectomy; Anterior cervical corpectomy (03/07/2012); Tonsillectomy; Multiple tooth extractions; Cataract extraction w/PHACO (Left, 04/12/2017); and Cataract extraction w/PHACO (Right, 05/03/2017).  ? ?Her family history includes Asthma in her daughter; Bipolar disorder in her daughter, daughter, daughter, daughter, and son; Drug abuse in her  daughter; Heart disease in her daughter and daughter; Hypertension in her daughter. [name] was adopted.[name] reports that [name] has been smoking cigarettes. [name] has a 25.00 pack-year smoking history. [name] has never used smokeless tobacco. [name] reports current alcohol use. [name] reports that [name] does not use drugs. ? ? ? ?ROS ?Review of Systems  ?Constitutional: Negative.   ?HENT: Negative.    ?Eyes:  Negative for visual disturbance.  ?Respiratory:  Positive for shortness of breath.   ?Cardiovascular:  Negative for chest pain.  ?Gastrointestinal:  Negative for abdominal pain.  ?Musculoskeletal:  Negative for arthralgias.  ?Neurological:  Positive for tremors.  ? ?Objective:  ?BP 135/80   Pulse 91   Temp 98.5 ?F (36.9 ?C)   Ht 5\' 1"  (1.549 m)   Wt 120 lb 12.8 oz (54.8 kg)   SpO2 95%   BMI 22.82 kg/m?  ? ?BP Readings from Last 3 Encounters:  ?08/03/21 135/80  ?05/23/21 (!) 151/74  ?05/16/21 137/76  ? ? ?Wt Readings from Last 3 Encounters:  ?08/03/21 120 lb 12.8 oz (54.8 kg)  ?05/23/21 122 lb 9.6 oz (55.6 kg)  ?05/15/21 122 lb 6.4 oz (55.5 kg)  ? ? ? ?Physical Exam ?Constitutional:   ?   General: [name] is not in acute distress. ?   Appearance: [name] is well-developed.  ?Cardiovascular:  ?   Rate and Rhythm: Normal rate and regular rhythm.  ?Pulmonary:  ?   Breath sounds: Normal breath sounds.  ?Musculoskeletal:     ?  General: Normal range of motion.  ?Skin: ?   General: Skin is warm and dry.  ?Neurological:  ?   Mental Status: [name] is alert and oriented to person, place, and time.  ?   Comments: Some tremors with movement. No tremor at rest or if distracted  ?Psychiatric:     ?   Mood and Affect: Mood is anxious.     ?   Speech: Speech is rapid and pressured.     ?   Cognition and Memory: Cognition normal.     ?   Judgment: Judgment is impulsive.  ? ? ? ? ?Assessment & Plan:  ? ?Alexx was seen today for anxiety. ? ?Diagnoses and all orders for this visit: ? ?GAD (generalized anxiety disorder) ? ?Depression, recurrent  (HCC) ? ?COPD with chronic bronchitis (HCC) ? ?Essential hypertension with goal blood pressure less than 130/80 ? ?Other orders ?-     DULoxetine (CYMBALTA) 30 MG capsule; Take 1 capsule (30 mg total) by mouth daily with supper. ? ? ? ? ? ? ?I am having [name] start on DULoxetine. I am also having her maintain her amLODipine, meloxicam, clopidogrel, budesonide-formoterol, atorvastatin, cyclobenzaprine, and levalbuterol. ? ?Allergies as of 08/03/2021   ? ?   Reactions  ? Penicillins Anaphylaxis, Swelling, Rash, Other (See Comments)  ? Has patient had a PCN reaction causing immediate rash, facial/tongue/throat swelling, SOB or lightheadedness with hypotension: yes ?Has patient had a PCN reaction causing severe rash involving mucus membranes or skin necrosis: no ?Has patient had a PCN reaction that required hospitalization: yes ?Has patient had a PCN reaction occurring within the last 10 years: no ?If all of the above answers are "NO", then may proceed with Cephalosporin use.  ? ?  ? ?  ?Medication List  ?  ? ?  ? Accurate as of August 03, 2021  2:56 PM. If you have any questions, ask your nurse or doctor.  ?  ?  ? ?  ? ?amLODipine 10 MG tablet ?Commonly known as: NORVASC ?Take 1 tablet (10 mg total) by mouth daily. ?  ?atorvastatin 40 MG tablet ?Commonly known as: LIPITOR ?Take 1 tablet (40 mg total) by mouth daily. ?  ?budesonide-formoterol 160-4.5 MCG/ACT inhaler ?Commonly known as: Symbicort ?Inhale 2 puffs into the lungs 2 (two) times daily. ?  ?clopidogrel 75 MG tablet ?Commonly known as: PLAVIX ?Take 1 tablet (75 mg total) by mouth daily. ?  ?cyclobenzaprine 5 MG tablet ?Commonly known as: FLEXERIL ?TAKE ONE TABLET THREE TIMES DAILY AS NEEDED FOR MUSCLE SPASMS ?  ?DULoxetine 30 MG capsule ?Commonly known as: Cymbalta ?Take 1 capsule (30 mg total) by mouth daily with supper. ?Started by: Mechele Claude, MD ?  ?levalbuterol 45 MCG/ACT inhaler ?Commonly known as: XOPENEX HFA ?Inhale 2 puffs into the lungs  every 4 (four) hours as needed for wheezing or shortness of breath. ?  ?meloxicam 15 MG tablet ?Commonly known as: MOBIC ?Take 1 tablet (15 mg total) by mouth daily. ?  ? ?  ? ? ? ?Follow-up: Return in about 3 months (around 11/03/2021), or if symptoms worsen or fail to improve, for COPD, Anxiety, hypertension, Depression. ? ?Mechele Claude, M.D. ?

## 2021-09-06 ENCOUNTER — Other Ambulatory Visit: Payer: Self-pay | Admitting: Nurse Practitioner

## 2021-09-06 DIAGNOSIS — J449 Chronic obstructive pulmonary disease, unspecified: Secondary | ICD-10-CM

## 2021-09-08 ENCOUNTER — Other Ambulatory Visit: Payer: Self-pay | Admitting: Family Medicine

## 2021-09-08 DIAGNOSIS — M5430 Sciatica, unspecified side: Secondary | ICD-10-CM

## 2021-09-28 DIAGNOSIS — J449 Chronic obstructive pulmonary disease, unspecified: Secondary | ICD-10-CM | POA: Diagnosis not present

## 2021-10-24 ENCOUNTER — Other Ambulatory Visit: Payer: Self-pay | Admitting: Nurse Practitioner

## 2021-10-24 DIAGNOSIS — E782 Mixed hyperlipidemia: Secondary | ICD-10-CM

## 2021-10-29 DIAGNOSIS — J449 Chronic obstructive pulmonary disease, unspecified: Secondary | ICD-10-CM | POA: Diagnosis not present

## 2021-10-30 ENCOUNTER — Telehealth: Payer: Self-pay | Admitting: Family Medicine

## 2021-10-30 NOTE — Telephone Encounter (Signed)
APPOINTMENT SCHEDULED, PATIENT AWARE

## 2021-11-06 ENCOUNTER — Ambulatory Visit: Payer: Medicare Other | Admitting: Family Medicine

## 2021-11-09 ENCOUNTER — Other Ambulatory Visit: Payer: Self-pay | Admitting: Family Medicine

## 2021-11-09 DIAGNOSIS — M5430 Sciatica, unspecified side: Secondary | ICD-10-CM

## 2021-11-28 DIAGNOSIS — J449 Chronic obstructive pulmonary disease, unspecified: Secondary | ICD-10-CM | POA: Diagnosis not present

## 2021-12-02 ENCOUNTER — Other Ambulatory Visit: Payer: Self-pay | Admitting: Family Medicine

## 2021-12-02 DIAGNOSIS — J449 Chronic obstructive pulmonary disease, unspecified: Secondary | ICD-10-CM

## 2021-12-08 DIAGNOSIS — J449 Chronic obstructive pulmonary disease, unspecified: Secondary | ICD-10-CM | POA: Diagnosis not present

## 2021-12-25 ENCOUNTER — Other Ambulatory Visit: Payer: Self-pay | Admitting: Family Medicine

## 2021-12-25 ENCOUNTER — Other Ambulatory Visit: Payer: Self-pay | Admitting: Nurse Practitioner

## 2021-12-25 DIAGNOSIS — M5430 Sciatica, unspecified side: Secondary | ICD-10-CM

## 2021-12-27 ENCOUNTER — Encounter: Payer: Self-pay | Admitting: Family Medicine

## 2021-12-27 ENCOUNTER — Ambulatory Visit (INDEPENDENT_AMBULATORY_CARE_PROVIDER_SITE_OTHER): Payer: Medicare Other | Admitting: Family Medicine

## 2021-12-27 ENCOUNTER — Ambulatory Visit (INDEPENDENT_AMBULATORY_CARE_PROVIDER_SITE_OTHER): Payer: Medicare Other

## 2021-12-27 VITALS — BP 149/72 | HR 92 | Temp 97.6°F | Ht 61.0 in | Wt 119.8 lb

## 2021-12-27 DIAGNOSIS — G959 Disease of spinal cord, unspecified: Secondary | ICD-10-CM

## 2021-12-27 DIAGNOSIS — J449 Chronic obstructive pulmonary disease, unspecified: Secondary | ICD-10-CM

## 2021-12-27 DIAGNOSIS — Z23 Encounter for immunization: Secondary | ICD-10-CM | POA: Diagnosis not present

## 2021-12-27 DIAGNOSIS — M542 Cervicalgia: Secondary | ICD-10-CM | POA: Diagnosis not present

## 2021-12-27 DIAGNOSIS — E782 Mixed hyperlipidemia: Secondary | ICD-10-CM

## 2021-12-27 DIAGNOSIS — I10 Essential (primary) hypertension: Secondary | ICD-10-CM

## 2021-12-27 MED ORDER — DULOXETINE HCL 60 MG PO CPEP: 60.0000 mg | ORAL_CAPSULE | Freq: Every day | ORAL | 1 refills | Status: DC

## 2021-12-27 MED ORDER — PREDNISONE 20 MG PO TABS: ORAL_TABLET | ORAL | 0 refills | Status: DC

## 2021-12-27 MED ORDER — AMLODIPINE BESYLATE 10 MG PO TABS: 10.0000 mg | ORAL_TABLET | Freq: Every day | ORAL | 3 refills | Status: DC

## 2021-12-27 MED ORDER — ATORVASTATIN CALCIUM 40 MG PO TABS: 40.0000 mg | ORAL_TABLET | Freq: Every day | ORAL | 3 refills | Status: DC

## 2021-12-27 NOTE — Progress Notes (Signed)
Subjective:  Patient ID: [name], female    DOB: 1941-05-05  Age: 81 y.o. MRN: [id number]  CC: Medical Management of Chronic Issues   HPI [name] presents for  follow-up of hypertension. Patient has no history of headache chest pain or shortness of breath or recent cough. Patient also denies symptoms of TIA such as focal numbness or weakness. Patient denies side effects from medication. States taking it regularly.   in for follow-up of elevated cholesterol. Doing well without complaints on current medication. Denies side effects of statin including myalgia and arthralgia and nausea. Currently no chest pain, shortness of breath or other cardiovascular related symptoms noted.  Swelling and pain at the base of the neck. Hurts to turn her head.     12/27/2021    2:56 PM 08/03/2021   10:19 AM 05/23/2021    9:15 AM 03/22/2020    9:26 AM  GAD 7 : Generalized Anxiety Score  Nervous, Anxious, on Edge _0 Control/stop worrying 1 0 2 0  Worry too much - different things 1 0 2 0  Trouble relaxing 0 3 2 0  Restless 0 2 1 0  Easily annoyed or irritable _1 Afraid - awful might happen 0 0 2 0  Total GAD 7 Score _2 Anxiety Difficulty Very difficult Somewhat difficult Somewhat difficult       12/27/2021    2:56 PM 08/03/2021   10:18 AM 05/23/2021    9:14 AM 01/27/2021    2:42 PM 11/21/2020    8:19 AM  Depression screen PHQ 2/9  Decreased Interest 0 1 2 0 0  Down, Depressed, Hopeless _3 0 0  PHQ - 2 Score _4 0 0  Altered sleeping _5 Tired, decreased energy _6 Change in appetite 1 2 0    Feeling bad or failure about yourself  1 0 0    Trouble concentrating 0 2 1    Moving slowly or fidgety/restless 0 2 0    Suicidal thoughts 0 0 0    PHQ-9 Score _7 Difficult doing work/chores Somewhat difficult Somewhat difficult Somewhat difficult        History [name] has a past medical history of Arthritis, GERD (gastroesophageal reflux  disease), Headache(784.0), Hepatitis, History of gout, Hypertension, Hypertensive encephalopathy, and Stroke ([place]).   She has a past surgical history that includes Abdominal hysterectomy; Anterior cervical corpectomy (03/07/2012); Tonsillectomy; Multiple tooth extractions; Cataract extraction w/PHACO (Left, 04/12/2017); and Cataract extraction w/PHACO (Right, 05/03/2017).   Her family history includes Asthma in her daughter; Bipolar disorder in her daughter, daughter, daughter, daughter, and son; Drug abuse in her daughter; Heart disease in her daughter and daughter; Hypertension in her daughter. She was adopted.She reports that she has been smoking cigarettes. She has a 25.00 pack-year smoking history. She has never used smokeless tobacco. She reports current alcohol use. She reports that she does not use drugs.  Current Outpatient Medications on File Prior to Visit  Medication Sig Dispense Refill   clopidogrel (PLAVIX) 75 MG tablet TAKE ONE TABLET ONCE DAILY 90 tablet 0   cyclobenzaprine (FLEXERIL) 5 MG tablet TAKE ONE TABLET THREE TIMES DAILY AS NEEDED FOR MUSCLE SPASMS 30 tablet 0   levalbuterol (XOPENEX HFA) 45 MCG/ACT inhaler Inhale 2 puffs into the lungs every 4 (four) hours as needed for wheezing or shortness  of breath. 1 each 12   SYMBICORT 160-4.5 MCG/ACT inhaler INHALE 2 PUFFS TWICE DAILY 10.2 g 2   No current facility-administered medications on file prior to visit.    ROS Review of Systems  Constitutional: Negative.   HENT: Negative.    Eyes:  Negative for visual disturbance.  Respiratory:  Positive for shortness of breath. Negative for cough.   Cardiovascular:  Negative for chest pain.  Gastrointestinal:  Negative for abdominal pain.  Musculoskeletal:  Positive for back pain and neck pain. Negative for arthralgias.  Psychiatric/Behavioral:  Positive for agitation. The patient is nervous/anxious.     Objective:  BP (!) 149/72   Pulse 92   Temp 97.6 F (36.4 C)   Ht 5' 1"  (1.549 m)   Wt 119 lb 12.8 oz (54.3 kg)   SpO2 95%   BMI 22.64 kg/m   BP Readings from Last 3 Encounters:  12/27/21 (!) 149/72  08/03/21 135/80  05/23/21 (!) 151/74    Wt Readings from Last 3 Encounters:  12/27/21 119 lb 12.8 oz (54.3 kg)  08/03/21 120 lb 12.8 oz (54.8 kg)  05/23/21 122 lb 9.6 oz (55.6 kg)     Physical Exam Constitutional:      General: She is not in acute distress.    Appearance: She is well-developed.  Cardiovascular:     Rate and Rhythm: Normal rate and regular rhythm.  Pulmonary:     Breath sounds: Wheezing present.  Musculoskeletal:        General: Tenderness (superior trapezius. bilaterally) present. Normal range of motion.  Skin:    General: Skin is warm and dry.  Neurological:     Mental Status: She is alert and oriented to person, place, and time.       Assessment & Plan:   Macaiah was seen today for medical management of chronic issues.  Diagnoses and all orders for this visit:  Essential hypertension with goal blood pressure less than 130/80 -     CMP14+EGFR -     CBC with Differential/Platelet  Mixed hyperlipidemia -     Lipid panel -     atorvastatin (LIPITOR) 40 MG tablet; Take 1 tablet (40 mg total) by mouth daily.  Essential hypertension -     amLODipine (NORVASC) 10 MG tablet; Take 1 tablet (10 mg total) by mouth daily.  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HCC) -     DG Cervical Spine Complete; Future  COPD with chronic bronchitis ([place])  Other orders -     DULoxetine (CYMBALTA) 60 MG capsule; Take 1 capsule (60 mg total) by mouth daily with supper. -     predniSONE (DELTASONE) 20 MG tablet; One twice daily with food for 2 weeks. Then one daily for 2 weeks   Allergies as of 12/27/2021       Reactions   Penicillins Anaphylaxis, Swelling, Rash, Other (See Comments)   Has patient had a PCN reaction causing immediate rash, facial/tongue/throat swelling, SOB or lightheadedness with hypotension: yes Has patient had a PCN reaction causing  severe rash involving mucus membranes or skin necrosis: no Has patient had a PCN reaction that required hospitalization: yes Has patient had a PCN reaction occurring within the last 10 years: no If all of the above answers are "NO", then may proceed with Cephalosporin use.        Medication List        Accurate as of December 27, 2021  3:17 PM. If you have any questions, ask your nurse or doctor.  STOP taking these medications    meloxicam 15 MG tablet Commonly known as: MOBIC Stopped by: Claretta Fraise, MD       TAKE these medications    amLODipine 10 MG tablet Commonly known as: NORVASC Take 1 tablet (10 mg total) by mouth daily.   atorvastatin 40 MG tablet Commonly known as: LIPITOR Take 1 tablet (40 mg total) by mouth daily.   clopidogrel 75 MG tablet Commonly known as: PLAVIX TAKE ONE TABLET ONCE DAILY   cyclobenzaprine 5 MG tablet Commonly known as: FLEXERIL TAKE ONE TABLET THREE TIMES DAILY AS NEEDED FOR MUSCLE SPASMS   DULoxetine 60 MG capsule Commonly known as: Cymbalta Take 1 capsule (60 mg total) by mouth daily with supper. What changed:  medication strength how much to take Changed by: Claretta Fraise, MD   levalbuterol 45 MCG/ACT inhaler Commonly known as: XOPENEX HFA Inhale 2 puffs into the lungs every 4 (four) hours as needed for wheezing or shortness of breath.   predniSONE 20 MG tablet Commonly known as: DELTASONE One twice daily with food for 2 weeks. Then one daily for 2 weeks Started by: Claretta Fraise, MD   Symbicort 160-4.5 MCG/ACT inhaler Generic drug: budesonide-formoterol INHALE 2 PUFFS TWICE DAILY        Meds ordered this encounter  Medications   amLODipine (NORVASC) 10 MG tablet    Sig: Take 1 tablet (10 mg total) by mouth daily.    Dispense:  90 tablet    Refill:  3   atorvastatin (LIPITOR) 40 MG tablet    Sig: Take 1 tablet (40 mg total) by mouth daily.    Dispense:  90 tablet    Refill:  3   DULoxetine  (CYMBALTA) 60 MG capsule    Sig: Take 1 capsule (60 mg total) by mouth daily with supper.    Dispense:  90 capsule    Refill:  1   predniSONE (DELTASONE) 20 MG tablet    Sig: One twice daily with food for 2 weeks. Then one daily for 2 weeks    Dispense:  42 tablet    Refill:  0    Pt. Anxious and in pain. Will monitor BP until next visit to avoid overtx.   Follow-up: Return in about 1 month (around 01/27/2022).  Claretta Fraise, M.D.

## 2021-12-27 NOTE — Addendum Note (Signed)
Addended by: Adella Hare B on: 12/27/2021 05:30 PM   Modules accepted: Orders

## 2021-12-29 DIAGNOSIS — J449 Chronic obstructive pulmonary disease, unspecified: Secondary | ICD-10-CM | POA: Diagnosis not present

## 2022-01-03 ENCOUNTER — Telehealth: Payer: Self-pay | Admitting: Family Medicine

## 2022-01-03 NOTE — Telephone Encounter (Signed)
Please review and advise.

## 2022-01-03 NOTE — Telephone Encounter (Signed)
The fusion looks stable. There are multiple bone spurs and arthritis changes

## 2022-01-03 NOTE — Telephone Encounter (Signed)
PATIENT AWARE

## 2022-01-29 DIAGNOSIS — J449 Chronic obstructive pulmonary disease, unspecified: Secondary | ICD-10-CM | POA: Diagnosis not present

## 2022-01-31 ENCOUNTER — Ambulatory Visit: Payer: Medicare Other

## 2022-02-14 ENCOUNTER — Other Ambulatory Visit: Payer: Self-pay | Admitting: Family Medicine

## 2022-02-14 DIAGNOSIS — M5430 Sciatica, unspecified side: Secondary | ICD-10-CM

## 2022-02-14 DIAGNOSIS — J449 Chronic obstructive pulmonary disease, unspecified: Secondary | ICD-10-CM

## 2022-02-27 ENCOUNTER — Other Ambulatory Visit: Payer: Self-pay | Admitting: Nurse Practitioner

## 2022-02-27 DIAGNOSIS — J4489 Other specified chronic obstructive pulmonary disease: Secondary | ICD-10-CM

## 2022-02-28 DIAGNOSIS — J449 Chronic obstructive pulmonary disease, unspecified: Secondary | ICD-10-CM | POA: Diagnosis not present

## 2022-03-12 ENCOUNTER — Emergency Department (HOSPITAL_COMMUNITY)
Admission: EM | Admit: 2022-03-12 | Discharge: 2022-03-12 | Disposition: A | Discharge status: Home / Self Care | Payer: Medicare Other | Attending: Emergency Medicine | Admitting: Emergency Medicine

## 2022-03-12 ENCOUNTER — Encounter (HOSPITAL_COMMUNITY): Payer: Self-pay | Admitting: Emergency Medicine

## 2022-03-12 ENCOUNTER — Emergency Department (HOSPITAL_COMMUNITY): Payer: Medicare Other

## 2022-03-12 ENCOUNTER — Other Ambulatory Visit: Payer: Self-pay

## 2022-03-12 DIAGNOSIS — R6889 Other general symptoms and signs: Secondary | ICD-10-CM | POA: Diagnosis not present

## 2022-03-12 DIAGNOSIS — Z7951 Long term (current) use of inhaled steroids: Secondary | ICD-10-CM | POA: Insufficient documentation

## 2022-03-12 DIAGNOSIS — R079 Chest pain, unspecified: Secondary | ICD-10-CM | POA: Diagnosis not present

## 2022-03-12 DIAGNOSIS — Z743 Need for continuous supervision: Secondary | ICD-10-CM | POA: Diagnosis not present

## 2022-03-12 DIAGNOSIS — I10 Essential (primary) hypertension: Secondary | ICD-10-CM | POA: Diagnosis not present

## 2022-03-12 DIAGNOSIS — J449 Chronic obstructive pulmonary disease, unspecified: Secondary | ICD-10-CM | POA: Insufficient documentation

## 2022-03-12 DIAGNOSIS — R0602 Shortness of breath: Secondary | ICD-10-CM | POA: Diagnosis not present

## 2022-03-12 DIAGNOSIS — Z79899 Other long term (current) drug therapy: Secondary | ICD-10-CM | POA: Insufficient documentation

## 2022-03-12 DIAGNOSIS — R0789 Other chest pain: Secondary | ICD-10-CM | POA: Insufficient documentation

## 2022-03-12 LAB — CBC
HCT: 45 % (ref 36.0–46.0)
Hemoglobin: 14.6 g/dL (ref 12.0–15.0)
MCH: 30.5 pg (ref 26.0–34.0)
MCHC: 32.4 g/dL (ref 30.0–36.0)
MCV: 94.1 fL (ref 80.0–100.0)
Platelets: 183 10*3/uL (ref 150–400)
RBC: 4.78 MIL/uL (ref 3.87–5.11)
RDW: 13.5 % (ref 11.5–15.5)
WBC: 6.5 10*3/uL (ref 4.0–10.5)
nRBC: 0 % (ref 0.0–0.2)

## 2022-03-12 LAB — BASIC METABOLIC PANEL
Anion gap: 8 (ref 5–15)
BUN: 15 mg/dL (ref 8–23)
CO2: 29 mmol/L (ref 22–32)
Calcium: 8.4 mg/dL — ABNORMAL LOW (ref 8.9–10.3)
Chloride: 106 mmol/L (ref 98–111)
Creatinine, Ser: 1.11 mg/dL — ABNORMAL HIGH (ref 0.44–1.00)
GFR, Estimated: 51 mL/min — ABNORMAL LOW (ref >60)
Glucose, Bld: 83 mg/dL (ref 70–99)
Potassium: 3.6 mmol/L (ref 3.5–5.1)
Sodium: 143 mmol/L (ref 135–145)

## 2022-03-12 LAB — TROPONIN I (HIGH SENSITIVITY)
Troponin I (High Sensitivity): 6 ng/L (ref <18)
Troponin I (High Sensitivity): 6 ng/L (ref <18)

## 2022-03-12 MED ORDER — MORPHINE SULFATE (PF) 2 MG/ML IV SOLN
2.0000 mg | Freq: Once | INTRAVENOUS | Status: AC
  Administered 2022-03-12: 2 mg via INTRAVENOUS
  Filled 2022-03-12: qty 1

## 2022-03-12 NOTE — ED Provider Notes (Signed)
[facility] EMERGENCY DEPARTMENT Provider Note   CSN: [id number] Arrival date & time: 03/12/22  3614     History  Chief Complaint  Patient presents with   Chest Pain    [name] is a 81 y.o. female.  Patient is a 81 year old female with past medical history of hypertension, hyperlipidemia, COPD, chronic renal insufficiency.  Patient presenting today for evaluation of chest discomfort.  This woke her from sleep at approximately 1130.  She describes a heaviness in the center of her chest with shortness of breath, but no nausea, diaphoresis, or radiation to the arm or jaw.  EMS was called and patient transported here.  She received 4 baby aspirin and sublingual nitroglycerin.  She states her pain went from a 9 to a 5 after this.  She denies any recent exertional symptoms.  Patient with no cardiac history otherwise.  The history is provided by the patient.       Home Medications Prior to Admission medications   Medication Sig Start Date End Date Taking? Authorizing Provider  amLODipine (NORVASC) 10 MG tablet Take 1 tablet (10 mg total) by mouth daily. 12/27/21   Mechele Claude, MD  atorvastatin (LIPITOR) 40 MG tablet Take 1 tablet (40 mg total) by mouth daily. 12/27/21   Mechele Claude, MD  budesonide-formoterol [facility]) 160-4.5 MCG/ACT inhaler INHALE 2 PUFFS TWICE DAILY 02/14/22   Mechele Claude, MD  clopidogrel (PLAVIX) 75 MG tablet TAKE ONE TABLET ONCE DAILY 12/25/21   Daryll Drown, NP  cyclobenzaprine (FLEXERIL) 5 MG tablet TAKE ONE TABLET THREE TIMES DAILY AS NEEDED FOR MUSCLE SPASMS 02/15/22   Mechele Claude, MD  DULoxetine (CYMBALTA) 60 MG capsule Take 1 capsule (60 mg total) by mouth daily with supper. 12/27/21   Mechele Claude, MD  levalbuterol [facility]) 45 MCG/ACT inhaler Inhale 2 puffs into the lungs every 4 (four) hours as needed for wheezing or shortness of breath. 07/30/21   Mechele Claude, MD  predniSONE (DELTASONE) 20 MG tablet One twice daily with food for 2  weeks. Then one daily for 2 weeks 12/27/21   Mechele Claude, MD      Allergies    Penicillins    Review of Systems   Review of Systems  All other systems reviewed and are negative.   Physical Exam Updated Vital Signs BP (!) 193/89   Pulse 85   Resp 17   Ht 5\' 1"  (1.549 m)   Wt 54 kg   SpO2 95%   BMI 22.49 kg/m  Physical Exam Vitals and nursing note reviewed.  Constitutional:      General: She is not in acute distress.    Appearance: She is well-developed. She is not diaphoretic.  HENT:     Head: Normocephalic and atraumatic.  Cardiovascular:     Rate and Rhythm: Normal rate and regular rhythm.     Heart sounds: No murmur heard.    No friction rub. No gallop.  Pulmonary:     Effort: Pulmonary effort is normal. No respiratory distress.     Breath sounds: Normal breath sounds. No wheezing.     Comments: There is tenderness to palpation of the anterior chest wall. Abdominal:     General: Bowel sounds are normal. There is no distension.     Palpations: Abdomen is soft.     Tenderness: There is no abdominal tenderness.  Musculoskeletal:        General: Normal range of motion.     Cervical back: Normal range of motion  and neck supple.     Right lower leg: No tenderness. No edema.     Left lower leg: No tenderness. No edema.  Skin:    General: Skin is warm and dry.  Neurological:     General: No focal deficit present.     Mental Status: She is alert and oriented to person, place, and time.     ED Results / Procedures / Treatments   Labs (all labs ordered are listed, but only abnormal results are displayed) Labs Reviewed  BASIC METABOLIC PANEL  CBC  TROPONIN I (HIGH SENSITIVITY)    EKG EKG Interpretation  Date/Time:  Monday March 12 2022 03:44:11 EDT Ventricular Rate:  88 PR Interval:  158 QRS Duration: 94 QT Interval:  398 QTC Calculation: 482 R Axis:   -27 Text Interpretation: Sinus rhythm with PAC's Anterior infarct, old No significant change since  05/15/2021 Confirmed by Veryl Speak [phone]) on 03/12/2022 3:47:20 AM  Radiology No results found.  Procedures Procedures  {Document cardiac monitor, telemetry assessment procedure when appropriate:1}  Medications Ordered in ED Medications - No data to display  ED Course/ Medical Decision Making/ A&P                           Medical Decision Making Amount and/or Complexity of Data Reviewed Labs: ordered. Radiology: ordered.   ***  {Document critical care time when appropriate:1} {Document review of labs and clinical decision tools ie heart score, Chads2Vasc2 etc:1}  {Document your independent review of radiology images, and any outside records:1} {Document your discussion with family members, caretakers, and with consultants:1} {Document social determinants of health affecting pt's care:1} {Document your decision making why or why not admission, treatments were needed:1} Final Clinical Impression(s) / ED Diagnoses Final diagnoses:  None    Rx / DC Orders ED Discharge Orders     None

## 2022-03-12 NOTE — ED Triage Notes (Signed)
Pt bib EMS with c/o epigastric chest pain rated 8/10. EMS states pt's bp upon their arrival was 205/130. Pt given 324mg  ASA and 1 SL NTG.

## 2022-03-12 NOTE — Discharge Instructions (Addendum)
Take Tylenol 650 mg every 6 hours as needed for pain.  Follow-up with cardiology in the next week, and return to the emergency department if you develop worsening pain, difficulty breathing, high fever, or for other new and concerning symptoms.

## 2022-03-12 NOTE — ED Notes (Addendum)
Pt's daughter Faylene Kurtz can be reached at [phone].

## 2022-03-19 ENCOUNTER — Ambulatory Visit (INDEPENDENT_AMBULATORY_CARE_PROVIDER_SITE_OTHER): Payer: Medicare Other | Admitting: Cardiovascular Disease

## 2022-03-19 ENCOUNTER — Encounter (HOSPITAL_BASED_OUTPATIENT_CLINIC_OR_DEPARTMENT_OTHER): Payer: Self-pay | Admitting: Cardiovascular Disease

## 2022-03-19 DIAGNOSIS — R0789 Other chest pain: Secondary | ICD-10-CM | POA: Diagnosis not present

## 2022-03-19 DIAGNOSIS — Q824 Ectodermal dysplasia (anhidrotic): Secondary | ICD-10-CM

## 2022-03-19 DIAGNOSIS — I739 Peripheral vascular disease, unspecified: Secondary | ICD-10-CM

## 2022-03-19 DIAGNOSIS — R634 Abnormal weight loss: Secondary | ICD-10-CM | POA: Diagnosis not present

## 2022-03-19 DIAGNOSIS — I10 Essential (primary) hypertension: Secondary | ICD-10-CM

## 2022-03-19 DIAGNOSIS — J4489 Other specified chronic obstructive pulmonary disease: Secondary | ICD-10-CM | POA: Diagnosis not present

## 2022-03-19 DIAGNOSIS — Z720 Tobacco use: Secondary | ICD-10-CM | POA: Diagnosis not present

## 2022-03-19 HISTORY — DX: Abnormal weight loss: R63.4

## 2022-03-19 HISTORY — DX: Other chest pain: R07.89

## 2022-03-19 HISTORY — DX: Ectodermal dysplasia (anhidrotic): Q82.4

## 2022-03-19 MED ORDER — VALSARTAN-HYDROCHLOROTHIAZIDE 160-12.5 MG PO TABS: 1.0000 | ORAL_TABLET | Freq: Every day | ORAL | 1 refills | Status: DC

## 2022-03-19 MED ORDER — METOPROLOL TARTRATE 100 MG PO TABS: ORAL_TABLET | ORAL | 0 refills | Status: DC

## 2022-03-19 NOTE — Assessment & Plan Note (Signed)
Patient with significant weight loss which she attributes to the [place] vaccination.  Recommended that she get her upcoming colonoscopy and mammography.

## 2022-03-19 NOTE — Assessment & Plan Note (Signed)
She continues to smoke about 1 pack of cigarettes daily.  She is smoked as much as 3 packs in the past.  Contemplating cessation.

## 2022-03-19 NOTE — Patient Instructions (Addendum)
Medication Instructions:  START VALSARTAN-HCT 160/12.5 MG DAILY   TAKE METOPROLOL 1 TABLET 2 HOURS PRIOR TO CT  *If you need a refill on your cardiac medications before your next appointment, please call your pharmacy*  Lab Work: FASTING LP/CMET IN 1 WEEK   If you have labs (blood work) drawn today and your tests are completely normal, you will receive your results only by: MyChart Message (if you have MyChart) OR A paper copy in the mail If you have any lab test that is abnormal or we need to change your treatment, we will call you to review the results.  Testing/Procedures: Your physician has requested that you have cardiac CT. Cardiac computed tomography (CT) is a painless test that uses an x-ray machine to take clear, detailed pictures of your heart. For further information please visit [email or web address] Please follow instruction sheet as given.  Follow-Up: At [facility], you and your health needs are our priority.  As part of our continuing mission to provide you with exceptional heart care, we have created designated Provider Care Teams.  These Care Teams include your primary Cardiologist (physician) and Advanced Practice Providers (APPs -  Physician Assistants and Nurse Practitioners) who all work together to provide you with the care you need, when you need it.  We recommend signing up for the patient portal called "MyChart".  Sign up information is provided on this After Visit Summary.  MyChart is used to connect with patients for Virtual Visits (Telemedicine).  Patients are able to view lab/test results, encounter notes, upcoming appointments, etc.  Non-urgent messages can be sent to your provider as well.   To learn more about what you can do with MyChart, go to [email or web address].    Your next appointment:   1 month(s)  The format for your next appointment:   In Person  Provider:   Gillian Shields, NP OR PHARM D    Other Instructions MONITOR YOUR BLOOD  PRESSURE TWICE A DAY AND LOG. BRING THE LOG AND YOUR BLOOD PRESSURE MACHINE TO YOUR FOLLOW UP IN 1 MONTH   You have been referred to PULMONARY     Your cardiac CT will be scheduled at one of the below locations:   [facility] [address] [place], [place] [address] [phone]  OR  [facility] [address] [place], [place] [address] [phone]  OR   [facility] [address] [place], [place] [address] [phone]  If scheduled at [facility], please arrive at the [facility] and Children[facility] Entrance (Entrance C2) of [facility] 30 minutes prior to test start time. You can use the FREE valet parking offered at entrance C (encouraged to control the heart rate for the test)  Proceed to the [facility] Radiology Department (first floor) to check-in and test prep.  All radiology patients and guests should use entrance C2 at [facility], accessed from [facility], even though the hospital's physical address listed is [address].    If scheduled at [facility] or [facility], please arrive 15 mins early for check-in and test prep.   Please follow these instructions carefully (unless otherwise directed):  Hold all erectile dysfunction medications at least 3 days (72 hrs) prior to test. (Ie viagra, cialis, sildenafil, tadalafil, etc) We will administer nitroglycerin during this exam.   On the Night Before the Test: Be sure to Drink plenty of water.  Do not consume any caffeinated/decaffeinated beverages or chocolate 12 hours prior to your test. Do not take any antihistamines 12 hours prior to your test.  On the Day of the Test: Drink plenty of water until 1 hour prior to the test. Do not eat any food 1 hour prior to test. You may take your regular medications prior to the test.  Take metoprolol  (Lopressor) two hours prior to test. HOLD Furosemide/Hydrochlorothiazide morning of the test. FEMALES- please wear underwire-free bra if available, avoid dresses & tight clothing  After the Test: Drink plenty of water. After receiving IV contrast, you may experience a mild flushed feeling. This is normal. On occasion, you may experience a mild rash up to 24 hours after the test. This is not dangerous. If this occurs, you can take Benadryl 25 mg and increase your fluid intake. If you experience trouble breathing, this can be serious. If it is severe call 911 IMMEDIATELY. If it is mild, please call our office. If you take any of these medications: Glipizide/Metformin, Avandament, Glucavance, please do not take 48 hours after completing test unless otherwise instructed.  We will call to schedule your test 2-4 weeks out understanding that some insurance companies will need an authorization prior to the service being performed.   For non-scheduling related questions, please contact the cardiac imaging nurse navigator should you have any questions/concerns: Marchia Bond, Cardiac Imaging Nurse Navigator Gordy Clement, Cardiac Imaging Nurse Navigator [facility] Direct Office Dial: [phone]   For scheduling needs, including cancellations and rescheduling, please call [place], [phone].

## 2022-03-19 NOTE — Assessment & Plan Note (Signed)
Patient has COPD/emphysema.  Poor air movement on exam.  She has significant exertional dyspnea that I suspect is due to her COPD.  Will refer to pulmonary.  Continue Symbicort and Xopenex.

## 2022-03-19 NOTE — Progress Notes (Signed)
Cardiology Office Note:    Date:  03/19/2022   ID:  [name], DOB Mar 29, 1941, MRN [id number]  PCP:  Claretta Fraise, MD   [facility] Prov[facility] Cardiologist:  Skeet Latch, MD     Referring MD: Claretta Fraise, MD   No chief complaint on file.   History of Present Illness:    [name] is a 81 y.o. female with a PMHx of HTN, HLD, COPD, penetrating aortic ulcer, stroke, and CKD who is being seen today for the evaluation of chest pain at the request of Claretta Fraise, MD. She was seen in the ED on 03/12/22 with chest pain and shortness of breath that awoke her from sleep. High sensitivity troponin was negative x2 and EKG was unremarkable at that time. She was referred for outpatient cardiology follow up.  Today, she states that she ate 2 apple sauces after waking up with her pain which seemed to worsen the pain. After eating 2 more apple sauce containers she went to the ED as her pain was not resolving. She continues to have intermittent diffuse chest pain since that time. She states that her pain occurs primarily at rest and when lying down at night, but rarely occurs with exertion. She is more so bothered by shortness of breath with walking, performing ADLs, and bending forward. She is not followed by pulmonology. She uses Symbicort daily. She notes accompanying orthopnea. She denies any leg swelling.  She checks her blood pressure at home and finds it to be in the 630Z systolic for the most part.  She states that she has 3 children that have had cardiac stents placed. Patient was smoking 3ppd and is now only smoking 1ppd. She started smoking at age 80. She does not drink alcohol.  She reports significant weight loss after receiving the COVID vaccine. Patient has not had a colonoscopy. She denies any bloody stools. She does have routine mammograms and is due for one.   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   Stroke ([place])    SLIGHT RT SIDE WEAKNESS 2001   Unintentional weight loss 03/19/2022    Past Surgical History:  Procedure Laterality Date   ABDOMINAL HYSTERECTOMY     ANTERIOR CERVICAL CORPECTOMY  03/07/2012   Procedure: ANTERIOR CERVICAL CORPECTOMY;  Surgeon: Kristeen Miss, MD;  Location: [facility];  Service: Neurosurgery;  Laterality: N/A;  Cervical six-seven, cervical seven-thoracic one Anterior cervical decompression/diskectomy/fusion, with Cervical seven Corpectomy, reconstruction using Allograft and Alphatec plate   CATARACT EXTRACTION W/PHACO Left 04/12/2017   Procedure: CATARACT EXTRACTION PHACO AND INTRAOCULAR LENS PLACEMENT ([place]);  Surgeon: Baruch Goldmann, MD;  Location: [facility];  Service: Ophthalmology;  Laterality: Left;  CDE: 3.82   CATARACT EXTRACTION W/PHACO Right 05/03/2017   Procedure: CATARACT EXTRACTION PHACO AND INTRAOCULAR LENS PLACEMENT RIGHT EYE;  Surgeon: Baruch Goldmann, MD;  Location: [facility];  Service: Ophthalmology;  Laterality: Right;  CDE: 3.89   MULTIPLE TOOTH EXTRACTIONS     TONSILLECTOMY      Current Medications: Current Meds  Medication Sig   amLODipine (NORVASC) 10 MG tablet Take 1 tablet (10 mg total) by mouth daily.  atorvastatin (LIPITOR) 40 MG tablet Take 1 tablet (40 mg total) by mouth daily.   budesonide-formoterol (SYMBICORT) 160-4.5 MCG/ACT inhaler INHALE 2 PUFFS TWICE DAILY   clopidogrel (PLAVIX) 75 MG tablet TAKE ONE TABLET ONCE DAILY   cyclobenzaprine (FLEXERIL) 5 MG tablet TAKE ONE TABLET THREE TIMES DAILY AS NEEDED FOR MUSCLE SPASMS   levalbuterol (XOPENEX HFA) 45 MCG/ACT inhaler Inhale 2 puffs into the lungs every 4 (four) hours as needed for wheezing or shortness of breath.   meloxicam  (MOBIC) 15 MG tablet Take 15 mg by mouth daily.   metoprolol tartrate (LOPRESSOR) 100 MG tablet TAKE 1 TABLET 2 HOURS PRIOR TO CT   valsartan-hydrochlorothiazide (DIOVAN HCT) 160-12.5 MG tablet Take 1 tablet by mouth daily.   [DISCONTINUED] DULoxetine (CYMBALTA) 60 MG capsule Take 1 capsule (60 mg total) by mouth daily with supper.   [DISCONTINUED] predniSONE (DELTASONE) 20 MG tablet One twice daily with food for 2 weeks. Then one daily for 2 weeks     Allergies:   Penicillins   Social History   Socioeconomic History   Marital status: Widowed    Spouse name: Not on file   Number of children: 5   Years of education: Not on file   Highest education level: Not on file  Occupational History   Occupation: retired    Comment: CNA  Tobacco Use   Smoking status: Every Day    Packs/day: 0.50    Years: 50.00    Total pack years: 25.00    Types: Cigarettes   Smokeless tobacco: Never  Vaping Use   Vaping Use: Never used  Substance and Sexual Activity   Alcohol use: Yes    Comment: occasional   Drug use: No   Sexual activity: Not Currently    Birth control/protection: Surgical  Other Topics Concern   Not on file  Social History Narrative   5 children living, 1 deceased.    Children live out of state.   Social Determinants of Health   Financial Resource Strain: Low Risk  (01/27/2021)   Overall Financial Resource Strain (CARDIA)    Difficulty of Paying Living Expenses: Not very hard  Food Insecurity: Food Insecurity Present (01/27/2021)   Hunger Vital Sign    Worried About Running Out of Food in the Last Year: Sometimes true    Ran Out of Food in the Last Year: Sometimes true  Transportation Needs: No Transportation Needs (01/27/2021)   PRAPARE - Administrator, Civil Service (Medical): No    Lack of Transportation (Non-Medical): No  Physical Activity: Sufficiently Active (01/27/2021)   Exercise Vital Sign    Days of Exercise per Week: 5 days    Minutes of Exercise per  Session: 40 min  Stress: No Stress Concern Present (01/27/2021)   [employer] of Occupational Health - Occupational Stress Questionnaire    Feeling of Stress : Only a little  Social Connections: Moderately Integrated (01/27/2021)   Social Connection and Isolation Panel [NHANES]    Frequency of Communication with Friends and Family: More than three times a week    Frequency of Social Gatherings with Friends and Family: More than three times a week    Attends Religious Services: More than 4 times per year    Active Member of [employer] or Organizations: Yes    Attends Banker Meetings: More than 4 times per year    Marital Status: Widowed     Family History: The patient's family history includes Asthma in her daughter;  Bipolar disorder in her daughter, daughter, daughter, daughter, and son; Drug abuse in her daughter; Heart disease in her daughter and daughter; Hypertension in her daughter. She was adopted.  ROS:   Please see the history of present illness.    (+) Shortness of breath (+) Orthopnea (+) Chest pain (+) Weight loss All other systems reviewed and are negative.  EKGs/Labs/Other Studies Reviewed:    The following studies were reviewed today:  EKG:  03/19/2022: EKG is not ordered today.  Recent Labs: 05/15/2021: ALT 13 03/12/2022: BUN 15; Creatinine, Ser 1.11; Hemoglobin 14.6; Platelets 183; Potassium 3.6; Sodium 143  Recent Lipid Panel    Component Value Date/Time   CHOL 167 02/21/2021 0817   TRIG 58 02/21/2021 0817   HDL 79 02/21/2021 0817   CHOLHDL 2.1 02/21/2021 0817   CHOLHDL 2.6 01/24/2020 0706   VLDL 6 01/24/2020 0706   LDLCALC 76 02/21/2021 0817     Risk Assessment/Calculations:      HYPERTENSION CONTROL Vitals:   03/19/22 0914 03/19/22 0917 03/19/22 0933  BP: (!) 190/82 (!) 192/88 (!) 182/80    The patient's blood pressure is elevated above target today.  In order to address the patient's elevated BP: A current anti-hypertensive  medication was adjusted today.      Physical Exam:    VS:  BP (!) 182/80 (BP Location: Left Arm)   Pulse 89   Ht 5\' 1"  (1.549 m)   Wt 121 lb 12.8 oz (55.2 kg)   SpO2 96%   BMI 23.01 kg/m  , BMI Body mass index is 23.01 kg/m. GENERAL:  Well appearing HEENT: Pupils equal round and reactive, fundi not visualized, oral mucosa unremarkable NECK:  No jugular venous distention, waveform within normal limits, carotid upstroke brisk and symmetric, no bruits, no thyromegaly LUNGS:  Clear to auscultation bilaterally HEART:  RRR.  PMI not displaced or sustained,S1 and S2 within normal limits, no S3, no S4, no clicks, no rubs, no murmurs ABD:  Flat, positive bowel sounds normal in frequency in pitch, no bruits, no rebound, no guarding, no midline pulsatile mass, no hepatomegaly, no splenomegaly EXT:  2 plus pulses throughout, no edema, no cyanosis no clubbing SKIN:  No rashes no nodules NEURO:  Cranial nerves II through XII grossly intact, motor grossly intact throughout PSYCH:  Cognitively intact, oriented to person place and time  ASSESSMENT:    1. Hypohidrotic ectodermal dysplasia syndrome   2. Essential hypertension with goal blood pressure less than 130/80   3. PAD (peripheral artery disease) -Atherosclerotic ulcer of aorta -posterior aspect of the descending aorta measuring up to 4 mm in thickness   4. COPD with chronic bronchitis   5. Atypical chest pain   6. Tobacco abuse   7. Unintentional weight loss    PLAN:    Essential hypertension with goal blood pressure less than 130/80 Blood pressure is very poorly controlled.  She has never been on anything other than amlodipine.  We will add valsartan/HCTZ 160/12.5 mg daily.  She is due for fasting lipids.  Come back for fasting lipids and a CMP in a week.  I have asked her to check her blood pressures and bring to follow-up.  PAD (peripheral artery disease) -Atherosclerotic ulcer of aorta -posterior aspect of the descending aorta  measuring up to 4 mm in thickness Penetrating aortic ulcer.  She is due for follow-up.  She is having chest pain that I suspect is more due to GERD but aortic ulcers can also cause chest pain.  Blood pressure control as above.  Continue atorvastatin.  We will look at the aortic ulcer on her coronary CTA.  We will asked that they scan to the top of her aorta so that we can fully evaluate.  COPD with chronic bronchitis (HCC) Patient has COPD/emphysema.  Poor air movement on exam.  She has significant exertional dyspnea that I suspect is due to her COPD.  Will refer to pulmonary.  Continue Symbicort and Xopenex.  Atypical chest pain Patient had chest pain when lying down.  She is able to ambulate without chest pain.  She is very uncontrolled risk factors including ongoing tobacco abuse and hypertensive urgency as well as hyperlipidemia.  We will get a coronary CTA to better evaluate.  She is unable to walk on a treadmill.  Continue atorvastatin and clopidogrel.  Tobacco abuse She continues to smoke about 1 pack of cigarettes daily.  She is smoked as much as 3 packs in the past.  Contemplating cessation.  Unintentional weight loss Patient with significant weight loss which she attributes to the COVID vaccination.  Recommended that she get her upcoming colonoscopy and mammography.            Medication Adjustments/Labs and Tests Ordered: Current medicines are reviewed at length with the patient today.  Concerns regarding medicines are outlined above.  Orders Placed This Encounter  Procedures   CT CORONARY MORPH W/CTA COR W/SCORE W/CA W/CM &/OR WO/CM   Lipid panel   Comprehensive metabolic panel   Ambulatory referral to Pulmonology   AMB Referral to [facility] Pharm-D   Meds ordered this encounter  Medications   valsartan-hydrochlorothiazide (DIOVAN HCT) 160-12.5 MG tablet    Sig: Take 1 tablet by mouth daily.    Dispense:  90 tablet    Refill:  1   metoprolol tartrate (LOPRESSOR) 100  MG tablet    Sig: TAKE 1 TABLET 2 HOURS PRIOR TO CT    Dispense:  1 tablet    Refill:  0    Patient Instructions  Medication Instructions:  START VALSARTAN-HCT 160/12.5 MG DAILY   TAKE METOPROLOL 1 TABLET 2 HOURS PRIOR TO CT  *If you need a refill on your cardiac medications before your next appointment, please call your pharmacy*  Lab Work: FASTING LP/CMET IN 1 WEEK   If you have labs (blood work) drawn today and your tests are completely normal, you will receive your results only by: MyChart Message (if you have MyChart) OR A paper copy in the mail If you have any lab test that is abnormal or we need to change your treatment, we will call you to review the results.  Testing/Procedures: Your physician has requested that you have cardiac CT. Cardiac computed tomography (CT) is a painless test that uses an x-ray machine to take clear, detailed pictures of your heart. For further information please visit [email or web address] Please follow instruction sheet as given.  Follow-Up: At [facility], you and your health needs are our priority.  As part of our continuing mission to provide you with exceptional heart care, we have created designated Provider Care Teams.  These Care Teams include your primary Cardiologist (physician) and Advanced Practice Providers (APPs -  Physician Assistants and Nurse Practitioners) who all work together to provide you with the care you need, when you need it.  We recommend signing up for the patient portal called "MyChart".  Sign up information is provided on this After Visit Summary.  MyChart is used to connect with patients for Virtual  Visits (Telemedicine).  Patients are able to view lab/test results, encounter notes, upcoming appointments, etc.  Non-urgent messages can be sent to your provider as well.   To learn more about what you can do with MyChart, go to [email or web address].    Your next appointment:   1 month(s)  The format for your  next appointment:   In Person  Provider:   Gillian Shields, NP OR PHARM D    Other Instructions MONITOR YOUR BLOOD PRESSURE TWICE A DAY AND LOG. BRING THE LOG AND YOUR BLOOD PRESSURE MACHINE TO YOUR FOLLOW UP IN 1 MONTH   You have been referred to PULMONARY     Your cardiac CT will be scheduled at one of the below locations:   [facility] [address] [place], [place] [address] [phone]  OR  [facility] [address] [place], [place] [address] [phone]  OR   [facility] [address] [place], [place] [address] [phone]  If scheduled at [facility], please arrive at the [facility] and Children's Entrance (Entrance C2) of [facility] 30 minutes prior to test start time. You can use the FREE valet parking offered at entrance C (encouraged to control the heart rate for the test)  Proceed to the [facility] Radiology Department (first floor) to check-in and test prep.  All radiology patients and guests should use entrance C2 at [facility], accessed from [facility], even though the hospital's physical address listed is [address].    If scheduled at [facility] or [facility], please arrive 15 mins early for check-in and test prep.   Please follow these instructions carefully (unless otherwise directed):  Hold all erectile dysfunction medications at least 3 days (72 hrs) prior to test. (Ie viagra, cialis, sildenafil, tadalafil, etc) We will administer nitroglycerin during this exam.   On the Night Before the Test: Be sure to Drink plenty of water. Do not consume any caffeinated/decaffeinated beverages or chocolate 12 hours prior to your test. Do not take any antihistamines 12 hours prior to your test.  On the Day of the Test: Drink plenty of water until 1 hour prior to the  test. Do not eat any food 1 hour prior to test. You may take your regular medications prior to the test.  Take metoprolol (Lopressor) two hours prior to test. HOLD Furosemide/Hydrochlorothiazide morning of the test. FEMALES- please wear underwire-free bra if available, avoid dresses & tight clothing  After the Test: Drink plenty of water. After receiving IV contrast, you may experience a mild flushed feeling. This is normal. On occasion, you may experience a mild rash up to 24 hours after the test. This is not dangerous. If this occurs, you can take Benadryl 25 mg and increase your fluid intake. If you experience trouble breathing, this can be serious. If it is severe call 911 IMMEDIATELY. If it is mild, please call our office. If you take any of these medications: Glipizide/Metformin, Avandament, Glucavance, please do not take 48 hours after completing test unless otherwise instructed.  We will call to schedule your test 2-4 weeks out understanding that some insurance companies will need an authorization prior to the service being performed.   For non-scheduling related questions, please contact the cardiac imaging nurse navigator should you have any questions/concerns: Rockwell Alexandria, Cardiac Imaging Nurse Navigator Larey Brick, Cardiac Imaging Nurse Navigator [facility]  [employer] Direct Office Dial: [phone]   For scheduling needs, including cancellations and rescheduling, please call [place], [phone].       I,Alexis Herring,acting as a Neurosurgeonscribe for Chilton Siiffany [place], MD.,have documented all relevant documentation on the behalf of Chilton Siiffany [place], MD,as directed by  Chilton Siiffany [place], MD while in the presence of Chilton Siiffany [place], MD.  I, Kattia Selley C. Duke Salviaandolph, MD have reviewed all documentation for this visit.  The documentation of the exam, diagnosis, procedures, and orders on 03/19/2022 are all accurate and complete.   Signed, Chilton Siiffany Fletcher, MD   03/19/2022 12:54 PM    [facility]

## 2022-03-19 NOTE — Assessment & Plan Note (Addendum)
Blood pressure is very poorly controlled.  She has never been on anything other than amlodipine.  We will add valsartan/HCTZ 160/12.5 mg daily.  She is due for fasting lipids.  Come back for fasting lipids and a CMP in a week.  I have asked her to check her blood pressures and bring to follow-up.

## 2022-03-19 NOTE — Assessment & Plan Note (Signed)
Penetrating aortic ulcer.  She is due for follow-up.  She is having chest pain that I suspect is more due to GERD but aortic ulcers can also cause chest pain.  Blood pressure control as above.  Continue atorvastatin.  We will look at the aortic ulcer on her coronary CTA.  We will asked that they scan to the top of her aorta so that we can fully evaluate.

## 2022-03-19 NOTE — Assessment & Plan Note (Signed)
Patient had chest pain when lying down.  She is able to ambulate without chest pain.  She is very uncontrolled risk factors including ongoing tobacco abuse and hypertensive urgency as well as hyperlipidemia.  We will get a coronary CTA to better evaluate.  She is unable to walk on a treadmill.  Continue atorvastatin and clopidogrel.

## 2022-03-20 ENCOUNTER — Other Ambulatory Visit (HOSPITAL_BASED_OUTPATIENT_CLINIC_OR_DEPARTMENT_OTHER): Payer: Self-pay | Admitting: Cardiovascular Disease

## 2022-03-25 ENCOUNTER — Emergency Department (HOSPITAL_COMMUNITY): Payer: Medicare Other

## 2022-03-25 ENCOUNTER — Encounter (HOSPITAL_COMMUNITY): Payer: Self-pay | Admitting: Emergency Medicine

## 2022-03-25 ENCOUNTER — Inpatient Hospital Stay (HOSPITAL_COMMUNITY)
Admission: EM | Admit: 2022-03-25 | Discharge: 2022-03-27 | DRG: 191 | Disposition: A | Discharge status: Home / Self Care | Payer: Medicare Other | Attending: Internal Medicine | Admitting: Internal Medicine

## 2022-03-25 ENCOUNTER — Other Ambulatory Visit: Payer: Self-pay

## 2022-03-25 DIAGNOSIS — Z825 Family history of asthma and other chronic lower respiratory diseases: Secondary | ICD-10-CM

## 2022-03-25 DIAGNOSIS — I499 Cardiac arrhythmia, unspecified: Secondary | ICD-10-CM | POA: Diagnosis not present

## 2022-03-25 DIAGNOSIS — R6889 Other general symptoms and signs: Secondary | ICD-10-CM | POA: Diagnosis not present

## 2022-03-25 DIAGNOSIS — N1831 Chronic kidney disease, stage 3a: Secondary | ICD-10-CM | POA: Diagnosis not present

## 2022-03-25 DIAGNOSIS — E782 Mixed hyperlipidemia: Secondary | ICD-10-CM | POA: Diagnosis not present

## 2022-03-25 DIAGNOSIS — Z90710 Acquired absence of both cervix and uterus: Secondary | ICD-10-CM

## 2022-03-25 DIAGNOSIS — Z8673 Personal history of transient ischemic attack (TIA), and cerebral infarction without residual deficits: Secondary | ICD-10-CM

## 2022-03-25 DIAGNOSIS — J441 Chronic obstructive pulmonary disease with (acute) exacerbation: Secondary | ICD-10-CM | POA: Diagnosis not present

## 2022-03-25 DIAGNOSIS — Z79899 Other long term (current) drug therapy: Secondary | ICD-10-CM | POA: Diagnosis not present

## 2022-03-25 DIAGNOSIS — Z7951 Long term (current) use of inhaled steroids: Secondary | ICD-10-CM

## 2022-03-25 DIAGNOSIS — I129 Hypertensive chronic kidney disease with stage 1 through stage 4 chronic kidney disease, or unspecified chronic kidney disease: Secondary | ICD-10-CM | POA: Diagnosis present

## 2022-03-25 DIAGNOSIS — Z743 Need for continuous supervision: Secondary | ICD-10-CM | POA: Diagnosis not present

## 2022-03-25 DIAGNOSIS — Z20822 Contact with and (suspected) exposure to covid-19: Secondary | ICD-10-CM | POA: Diagnosis present

## 2022-03-25 DIAGNOSIS — F17210 Nicotine dependence, cigarettes, uncomplicated: Secondary | ICD-10-CM | POA: Diagnosis not present

## 2022-03-25 DIAGNOSIS — Z7902 Long term (current) use of antithrombotics/antiplatelets: Secondary | ICD-10-CM | POA: Diagnosis not present

## 2022-03-25 DIAGNOSIS — N179 Acute kidney failure, unspecified: Secondary | ICD-10-CM | POA: Diagnosis not present

## 2022-03-25 DIAGNOSIS — I10 Essential (primary) hypertension: Secondary | ICD-10-CM | POA: Diagnosis not present

## 2022-03-25 DIAGNOSIS — R0689 Other abnormalities of breathing: Secondary | ICD-10-CM | POA: Diagnosis not present

## 2022-03-25 DIAGNOSIS — R0600 Dyspnea, unspecified: Secondary | ICD-10-CM | POA: Diagnosis not present

## 2022-03-25 DIAGNOSIS — Z8249 Family history of ischemic heart disease and other diseases of the circulatory system: Secondary | ICD-10-CM | POA: Diagnosis not present

## 2022-03-25 DIAGNOSIS — N1832 Chronic kidney disease, stage 3b: Secondary | ICD-10-CM | POA: Diagnosis present

## 2022-03-25 DIAGNOSIS — R0602 Shortness of breath: Secondary | ICD-10-CM | POA: Diagnosis not present

## 2022-03-25 DIAGNOSIS — E785 Hyperlipidemia, unspecified: Secondary | ICD-10-CM | POA: Diagnosis present

## 2022-03-25 DIAGNOSIS — K219 Gastro-esophageal reflux disease without esophagitis: Secondary | ICD-10-CM | POA: Diagnosis not present

## 2022-03-25 LAB — PHOSPHORUS: Phosphorus: 3.3 mg/dL (ref 2.5–4.6)

## 2022-03-25 LAB — CBC WITH DIFFERENTIAL/PLATELET
Abs Immature Granulocytes: 0.01 10*3/uL (ref 0.00–0.07)
Basophils Absolute: 0 10*3/uL (ref 0.0–0.1)
Basophils Relative: 1 %
Eosinophils Absolute: 0.5 10*3/uL (ref 0.0–0.5)
Eosinophils Relative: 10 %
HCT: 50.4 % — ABNORMAL HIGH (ref 36.0–46.0)
Hemoglobin: 16.1 g/dL — ABNORMAL HIGH (ref 12.0–15.0)
Immature Granulocytes: 0 %
Lymphocytes Relative: 24 %
Lymphs Abs: 1.3 10*3/uL (ref 0.7–4.0)
MCH: 30.5 pg (ref 26.0–34.0)
MCHC: 31.9 g/dL (ref 30.0–36.0)
MCV: 95.5 fL (ref 80.0–100.0)
Monocytes Absolute: 0.2 10*3/uL (ref 0.1–1.0)
Monocytes Relative: 5 %
Neutro Abs: 3.1 10*3/uL (ref 1.7–7.7)
Neutrophils Relative %: 60 %
Platelets: 190 10*3/uL (ref 150–400)
RBC: 5.28 MIL/uL — ABNORMAL HIGH (ref 3.87–5.11)
RDW: 13.5 % (ref 11.5–15.5)
WBC: 5.2 10*3/uL (ref 4.0–10.5)
nRBC: 0 % (ref 0.0–0.2)

## 2022-03-25 LAB — COMPREHENSIVE METABOLIC PANEL
ALT: 10 U/L (ref 0–44)
AST: 20 U/L (ref 15–41)
Albumin: 4.1 g/dL (ref 3.5–5.0)
Alkaline Phosphatase: 45 U/L (ref 38–126)
Anion gap: 10 (ref 5–15)
BUN: 16 mg/dL (ref 8–23)
CO2: 29 mmol/L (ref 22–32)
Calcium: 9.3 mg/dL (ref 8.9–10.3)
Chloride: 104 mmol/L (ref 98–111)
Creatinine, Ser: 1.24 mg/dL — ABNORMAL HIGH (ref 0.44–1.00)
GFR, Estimated: 44 mL/min — ABNORMAL LOW (ref >60)
Glucose, Bld: 117 mg/dL — ABNORMAL HIGH (ref 70–99)
Potassium: 3.8 mmol/L (ref 3.5–5.1)
Sodium: 143 mmol/L (ref 135–145)
Total Bilirubin: 0.7 mg/dL (ref 0.3–1.2)
Total Protein: 7.6 g/dL (ref 6.5–8.1)

## 2022-03-25 LAB — BLOOD GAS, VENOUS
Acid-Base Excess: 6.8 mmol/L — ABNORMAL HIGH (ref 0.0–2.0)
Bicarbonate: 33.7 mmol/L — ABNORMAL HIGH (ref 20.0–28.0)
Drawn by: 7012
O2 Saturation: 36.4 %
Patient temperature: 36.6
pCO2, Ven: 56 mmHg (ref 44–60)
pH, Ven: 7.39 (ref 7.25–7.43)
pO2, Ven: <31 mmHg — CL (ref 32–45)

## 2022-03-25 LAB — LIPASE, BLOOD: Lipase: 41 U/L (ref 11–51)

## 2022-03-25 LAB — SARS CORONAVIRUS 2 BY RT PCR: SARS Coronavirus 2 by RT PCR: NEGATIVE

## 2022-03-25 LAB — MAGNESIUM: Magnesium: 2.3 mg/dL (ref 1.7–2.4)

## 2022-03-25 MED ORDER — ACETAMINOPHEN 650 MG RE SUPP: 650.0000 mg | Freq: Four times a day (QID) | RECTAL | Status: DC | PRN

## 2022-03-25 MED ORDER — METHYLPREDNISOLONE SODIUM SUCC 40 MG IJ SOLR
40.0000 mg | Freq: Two times a day (BID) | INTRAMUSCULAR | Status: DC
  Administered 2022-03-25 – 2022-03-27 (×5): 40 mg via INTRAVENOUS
  Filled 2022-03-25 (×5): qty 1

## 2022-03-25 MED ORDER — ALBUTEROL SULFATE (2.5 MG/3ML) 0.083% IN NEBU: 2.5000 mg | INHALATION_SOLUTION | RESPIRATORY_TRACT | Status: DC | PRN

## 2022-03-25 MED ORDER — CLOPIDOGREL BISULFATE 75 MG PO TABS
75.0000 mg | ORAL_TABLET | Freq: Every day | ORAL | Status: DC
  Administered 2022-03-25 – 2022-03-27 (×3): 75 mg via ORAL
  Filled 2022-03-25 (×3): qty 1

## 2022-03-25 MED ORDER — ENOXAPARIN SODIUM 40 MG/0.4ML IJ SOSY: 40.0000 mg | PREFILLED_SYRINGE | INTRAMUSCULAR | Status: DC

## 2022-03-25 MED ORDER — ONDANSETRON HCL 4 MG/2ML IJ SOLN: 4.0000 mg | Freq: Four times a day (QID) | INTRAMUSCULAR | Status: DC | PRN

## 2022-03-25 MED ORDER — PANTOPRAZOLE SODIUM 40 MG PO TBEC
40.0000 mg | DELAYED_RELEASE_TABLET | Freq: Every day | ORAL | Status: DC
  Administered 2022-03-25 – 2022-03-27 (×3): 40 mg via ORAL
  Filled 2022-03-25 (×3): qty 1

## 2022-03-25 MED ORDER — DOXYCYCLINE HYCLATE 100 MG PO TABS
100.0000 mg | ORAL_TABLET | Freq: Two times a day (BID) | ORAL | Status: DC
  Administered 2022-03-25 – 2022-03-27 (×5): 100 mg via ORAL
  Filled 2022-03-25 (×5): qty 1

## 2022-03-25 MED ORDER — AMLODIPINE BESYLATE 5 MG PO TABS
10.0000 mg | ORAL_TABLET | Freq: Every day | ORAL | Status: DC
  Administered 2022-03-26 – 2022-03-27 (×2): 10 mg via ORAL
  Filled 2022-03-25 (×2): qty 2

## 2022-03-25 MED ORDER — ENOXAPARIN SODIUM 30 MG/0.3ML IJ SOSY
30.0000 mg | PREFILLED_SYRINGE | INTRAMUSCULAR | Status: DC
  Administered 2022-03-25 – 2022-03-27 (×3): 30 mg via SUBCUTANEOUS
  Filled 2022-03-25 (×3): qty 0.3

## 2022-03-25 MED ORDER — ONDANSETRON HCL 4 MG PO TABS: 4.0000 mg | ORAL_TABLET | Freq: Four times a day (QID) | ORAL | Status: DC | PRN

## 2022-03-25 MED ORDER — IPRATROPIUM-ALBUTEROL 0.5-2.5 (3) MG/3ML IN SOLN
3.0000 mL | Freq: Once | RESPIRATORY_TRACT | Status: AC
  Administered 2022-03-25: 3 mL via RESPIRATORY_TRACT
  Filled 2022-03-25: qty 3

## 2022-03-25 MED ORDER — ATORVASTATIN CALCIUM 40 MG PO TABS
40.0000 mg | ORAL_TABLET | Freq: Every day | ORAL | Status: DC
  Administered 2022-03-25 – 2022-03-27 (×3): 40 mg via ORAL
  Filled 2022-03-25 (×3): qty 1

## 2022-03-25 MED ORDER — SODIUM CHLORIDE 0.9 % IV SOLN: 250.0000 mL | INTRAVENOUS | Status: DC | PRN

## 2022-03-25 MED ORDER — SODIUM CHLORIDE 0.9% FLUSH
3.0000 mL | Freq: Two times a day (BID) | INTRAVENOUS | Status: DC
  Administered 2022-03-25 – 2022-03-26 (×3): 3 mL via INTRAVENOUS

## 2022-03-25 MED ORDER — ARFORMOTEROL TARTRATE 15 MCG/2ML IN NEBU
15.0000 ug | INHALATION_SOLUTION | Freq: Two times a day (BID) | RESPIRATORY_TRACT | Status: DC
  Administered 2022-03-25 – 2022-03-27 (×5): 15 ug via RESPIRATORY_TRACT
  Filled 2022-03-25 (×5): qty 2

## 2022-03-25 MED ORDER — SODIUM CHLORIDE 0.9 % IV SOLN: INTRAVENOUS | Status: AC

## 2022-03-25 MED ORDER — ACETAMINOPHEN 325 MG PO TABS
650.0000 mg | ORAL_TABLET | Freq: Four times a day (QID) | ORAL | Status: DC | PRN
  Administered 2022-03-25 – 2022-03-26 (×2): 650 mg via ORAL
  Filled 2022-03-25 (×2): qty 2

## 2022-03-25 MED ORDER — DM-GUAIFENESIN ER 30-600 MG PO TB12
1.0000 | ORAL_TABLET | Freq: Two times a day (BID) | ORAL | Status: DC
  Administered 2022-03-25 – 2022-03-27 (×5): 1 via ORAL
  Filled 2022-03-25 (×5): qty 1

## 2022-03-25 MED ORDER — IPRATROPIUM-ALBUTEROL 0.5-2.5 (3) MG/3ML IN SOLN
3.0000 mL | Freq: Four times a day (QID) | RESPIRATORY_TRACT | Status: DC
  Administered 2022-03-25 – 2022-03-27 (×9): 3 mL via RESPIRATORY_TRACT
  Filled 2022-03-25 (×9): qty 3

## 2022-03-25 MED ORDER — AMLODIPINE BESYLATE 5 MG PO TABS
10.0000 mg | ORAL_TABLET | Freq: Once | ORAL | Status: AC
  Administered 2022-03-25: 10 mg via ORAL
  Filled 2022-03-25: qty 2

## 2022-03-25 MED ORDER — HYDRALAZINE HCL 20 MG/ML IJ SOLN: 10.0000 mg | Freq: Four times a day (QID) | INTRAMUSCULAR | Status: DC | PRN

## 2022-03-25 MED ORDER — BENZONATATE 100 MG PO CAPS
200.0000 mg | ORAL_CAPSULE | Freq: Three times a day (TID) | ORAL | Status: DC | PRN
  Administered 2022-03-25 – 2022-03-27 (×4): 200 mg via ORAL
  Filled 2022-03-25 (×4): qty 2

## 2022-03-25 MED ORDER — BUDESONIDE 0.5 MG/2ML IN SUSP
0.5000 mg | Freq: Two times a day (BID) | RESPIRATORY_TRACT | Status: DC
  Administered 2022-03-25 – 2022-03-27 (×5): 0.5 mg via RESPIRATORY_TRACT
  Filled 2022-03-25 (×5): qty 2

## 2022-03-25 MED ORDER — MAGNESIUM SULFATE 2 GM/50ML IV SOLN
2.0000 g | Freq: Once | INTRAVENOUS | Status: AC
  Administered 2022-03-25: 2 g via INTRAVENOUS
  Filled 2022-03-25: qty 50

## 2022-03-25 MED ORDER — GUAIFENESIN-DM 100-10 MG/5ML PO SYRP
5.0000 mL | ORAL_SOLUTION | ORAL | Status: DC | PRN
  Administered 2022-03-25 – 2022-03-26 (×2): 5 mL via ORAL
  Filled 2022-03-25 (×3): qty 5

## 2022-03-25 MED ORDER — CYCLOBENZAPRINE HCL 10 MG PO TABS
10.0000 mg | ORAL_TABLET | Freq: Two times a day (BID) | ORAL | Status: DC | PRN
  Administered 2022-03-26: 10 mg via ORAL
  Filled 2022-03-25: qty 1

## 2022-03-25 MED ORDER — BENZONATATE 100 MG PO CAPS
100.0000 mg | ORAL_CAPSULE | Freq: Once | ORAL | Status: AC
  Administered 2022-03-25: 100 mg via ORAL
  Filled 2022-03-25: qty 1

## 2022-03-25 MED ORDER — SODIUM CHLORIDE 0.9% FLUSH: 3.0000 mL | INTRAVENOUS | Status: DC | PRN

## 2022-03-25 NOTE — Assessment & Plan Note (Signed)
-  Requiring oxygen supplementation (2 L) at time of admission -Diffuse wheezing, tachypnea and inability to speak in full sentences appreciated at time of admission. -No infiltrate appreciated on chest x-ray; positive chronic bronchitis seen. -COVID/influenza PCR negative -Patient will receive treatment with oral doxycycline, steroids, nebulizer/bronchodilator management and mucolytic/antitussive medications. -Wean off oxygen supplementation as tolerated

## 2022-03-25 NOTE — ED Triage Notes (Signed)
Pt BIB [facility] with reports of SHOB. Per Ems pt was 80% on RA upon arrival. Pt received 125mg  solumedrol and 1 albuterol breathing tx.

## 2022-03-25 NOTE — Assessment & Plan Note (Signed)
Continue PPI ?

## 2022-03-25 NOTE — H&P (Signed)
History and Physical    Patient: [name] [id number] DOB: Jun 22, 1940 DOA: 03/25/2022 DOS: the patient was seen and examined on 03/25/2022 PCP: Claretta Fraise, MD  Patient coming from: Home  Chief Complaint:  Chief Complaint  Patient presents with   Shortness of Breath   HPI: [name] is a 81 y.o. female with medical history significant of gastroesophageal reflux disease, hypertension, COPD, and prior history of a stroke without significant residual deficits; who presented to the hospital secondary to increased shortness of breath and worsening wheezing.  Patient reports symptom has been present for the last 3-4 days and worsening.  Home inhaler management has not help solving the problem.  Patient denies chest pain, nausea, vomiting, fever, chills, sick contacts, dysuria, hematuria, melena, hematochezia or any other complaints.  At the time EMS arrived to her house patient was found to be in the mid 80s oxygen saturation on room air.  2 L nasal cannula supplementation were applied and a steroids/albuterol nebulizer provided.  Patient transported to the hospital for further evaluation and management.  Diffuse expiratory wheezing with decreased breath sounds appreciated on examination and every time patient in brace into activity or wean off from oxygen supplementation attempted she developed tachypnea and worsening breathing.  Chest x-ray without signs of cardiopulmonary process.    Steroids and bronchodilators provided.  TRH consulted to place patient in the hospital for further evaluation and management of COPD exacerbation.  Review of Systems: As mentioned in the history of present illness. All other systems reviewed and are negative. 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   Stroke ([place])    SLIGHT RT SIDE WEAKNESS 2001   Unintentional weight loss 03/19/2022   Past Surgical History:  Procedure Laterality Date   ABDOMINAL HYSTERECTOMY     ANTERIOR CERVICAL CORPECTOMY  03/07/2012   Procedure: ANTERIOR CERVICAL CORPECTOMY;  Surgeon: Kristeen Miss, MD;  Location: [facility];  Service: Neurosurgery;  Laterality: N/A;  Cervical six-seven, cervical seven-thoracic one Anterior cervical decompression/diskectomy/fusion, with Cervical seven Corpectomy, reconstruction using Allograft and Alphatec plate   CATARACT EXTRACTION W/PHACO Left 04/12/2017   Procedure: CATARACT EXTRACTION PHACO AND INTRAOCULAR LENS PLACEMENT ([place]);  Surgeon: Baruch Goldmann, MD;  Location: [facility];  Service: Ophthalmology;  Laterality: Left;  CDE: 3.82   CATARACT EXTRACTION W/PHACO Right 05/03/2017   Procedure: CATARACT EXTRACTION PHACO AND INTRAOCULAR LENS PLACEMENT RIGHT EYE;  Surgeon: Baruch Goldmann, MD;  Location: [facility];  Service: Ophthalmology;  Laterality: Right;  CDE: 3.89   MULTIPLE TOOTH EXTRACTIONS     TONSILLECTOMY     Social History:  reports that she has been smoking cigarettes. She has a 25.00 pack-year smoking history. She has never used smokeless tobacco. She reports current alcohol use. She reports that she does not use drugs.  Allergies  Allergen Reactions   Penicillins Anaphylaxis, Swelling, Rash and Other (See Comments)    Has patient had a PCN reaction causing immediate rash, facial/tongue/throat swelling, SOB or lightheadedness with hypotension: yes Has patient had a PCN reaction causing severe rash involving mucus membranes or skin necrosis: no Has patient had a PCN reaction that required hospitalization: yes  Has patient had a PCN reaction occurring within the last 10 years: no If all of the above answers are "NO", then may proceed with Cephalosporin use.      Family History  Adopted: Yes  Problem Relation Age of Onset   Bipolar disorder Daughter    Heart disease Daughter    Asthma Daughter    Bipolar disorder Son    Bipolar disorder Daughter    Bipolar disorder Daughter    Bipolar disorder Daughter    Hypertension Daughter    Heart disease Daughter    Drug abuse Daughter        OD    Prior to Admission medications   Medication Sig Start Date End Date Taking? Authorizing Provider  amLODipine (NORVASC) 10 MG tablet Take 1 tablet (10 mg total) by mouth daily. 12/27/21   Claretta Fraise, MD  atorvastatin (LIPITOR) 40 MG tablet Take 1 tablet (40 mg total) by mouth daily. 12/27/21   Claretta Fraise, MD  budesonide-formoterol [facility]) 160-4.5 MCG/ACT inhaler INHALE 2 PUFFS TWICE DAILY 02/14/22   Claretta Fraise, MD  clopidogrel (PLAVIX) 75 MG tablet TAKE ONE TABLET ONCE DAILY 12/25/21   Ivy Lynn, NP  cyclobenzaprine (FLEXERIL) 5 MG tablet TAKE ONE TABLET THREE TIMES DAILY AS NEEDED FOR MUSCLE SPASMS 02/15/22   Claretta Fraise, MD  levalbuterol [facility] HFA) 45 MCG/ACT inhaler Inhale 2 puffs into the lungs every 4 (four) hours as needed for wheezing or shortness of breath. 07/30/21   Claretta Fraise, MD  meloxicam (MOBIC) 15 MG tablet Take 15 mg by mouth daily.    [provider]  metoprolol tartrate (LOPRESSOR) 100 MG tablet TAKE 1 TABLET 2 HOURS PRIOR TO CT 03/19/22   Skeet Latch, MD  valsartan-hydrochlorothiazide (DIOVAN HCT) 160-12.5 MG tablet Take 1 tablet by mouth daily. 03/19/22   Skeet Latch, MD    Physical Exam: Vitals:   03/25/22 1226 03/25/22 1309 03/25/22 1718 03/25/22 1756  BP: (!) 169/94   (!) 163/80  Pulse: 93   (!) 53  Resp: 18     Temp: 98.2 F (36.8 C)     TempSrc: Oral     SpO2:  93% 94% 97%  Weight: 55.2 kg     Height: 5\' 1"  (1.549 m)      General exam: Alert, awake, oriented x 3; no fever, no chest pain, no nausea vomiting.  Difficulty speaking in full sentences appreciated on exam.  Oxygen  supplementation in place. Respiratory system: Fair air movement, positive rhonchi and diffuse expiratory wheezing appreciated on examination. Cardiovascular system:RRR. No rubs or gallops. Gastrointestinal system: Abdomen is nondistended, soft and nontender. No organomegaly or masses felt. Normal bowel sounds heard. Central nervous system: No focal neurological deficits. Extremities: No cyanosis or clubbing. Skin: No petechiae. Psychiatry: Judgement and insight appear normal. Mood & affect appropriate.   Data Reviewed: Chest x-ray: Without acute cardiopulmonary process COVID/influenza PCR negative CBC: WBCs 5.2, hemoglobin 16.1 and platelet count 190 K Comprehensive metabolic panel: Sodium 809, potassium 3.8, chloride 104, bicarb 29, BUN 16, creatinine 1.24 and a GFR of 44 Lipase: 41 Magnesium: 2.3 Phosphorus: 3.3.  Assessment and Plan: * COPD with acute exacerbation ([place]) -Requiring oxygen supplementation (2 L) at time of admission -Diffuse wheezing, tachypnea and inability to speak in full sentences appreciated at time of admission. -No infiltrate appreciated on chest x-ray; positive chronic bronchitis seen. -COVID/influenza PCR negative -Patient will receive treatment with oral doxycycline, steroids, nebulizer/bronchodilator management and mucolytic/antitussive medications. -Wean off oxygen supplementation as tolerated  AKI (  acute kidney injury) ([place]) - Minimize nephrotoxic agents, avoid hypotension and the use of contrast. -Judicious fluid resuscitation will be provided -No urinary retention or complaints of dysuria to suggest UTI. -Follow renal function trend.  GERD (gastroesophageal reflux disease) - Continue PPI.  Stage 3a chronic kidney disease ([place]) - Presenting with mild acute kidney injury on chronic kidney disease a stage IIIa. -Judicious fluid resuscitation will be provided -Holding nephrotoxic agents overnight -Follow renal function trend. -Patient denies  dysuria or complaints of urinary retention  Hyperlipidemia - Continue statin -Heart healthy diet discussed with patient.  Essential hypertension with goal blood pressure less than 130/80 - Resume home antihypertensive agents; with exception of ARB/HCTZ combo in the setting of acute kidney injury. -As needed hydralazine has been ordered -Heart healthy diet discussed with patient. -Follow-up vital signs.  Prior history of stroke -No acute neurologic deficits or complaints provided -Continue risk factor modification -Continue the use of Plavix for secondary prevention.    Advance Care Planning:   Code Status: Full Code   Consults: None  Family Communication: No family at bedside.  Severity of Illness: The appropriate patient status for this patient is OBSERVATION. Observation status is judged to be reasonable and necessary in order to provide the required intensity of service to ensure the patient's safety. The patient's presenting symptoms, physical exam findings, and initial radiographic and laboratory data in the context of their medical condition is felt to place them at decreased risk for further clinical deterioration. Furthermore, it is anticipated that the patient will be medically stable for discharge from the hospital within 2 midnights of admission.   Author: Barton Dubois, MD 03/25/2022 6:26 PM  For on call review [email or web address].

## 2022-03-25 NOTE — Assessment & Plan Note (Signed)
Continue statin 

## 2022-03-25 NOTE — Assessment & Plan Note (Signed)
-  continue to Minimize nephrotoxic agents, avoid hypotension and the use of contrast. -Maintain adequate hydration -No urinary retention or complaints of dysuria to suggest UTI. -continue to Follow renal function trend. -Repeat basic metabolic panel follow-up visit to assess recovery and stability.

## 2022-03-25 NOTE — ED Provider Notes (Signed)
[facility] EMERGENCY DEPARTMENT Provider Note   CSN: [id number] Arrival date & time: 03/25/22  0734     History Chief Complaint  Patient presents with   Shortness of Breath    HPI [name] is a 81 y.o. female presenting for shortness of breath.  She is a 81 year old female with history of COPD.  She endorses 3 days of increased work of breathing, shortness of breath, fatigue, increased sputum production.  She endorses a history of similar in the past that she feels this is more severe than baseline.  Was satting in the low 80% with EMS started on 2 L nasal cannula treated with 125 Solu-Medrol and albuterol nebulizer in transit.  She denies nausea vomiting, syncope, chest pain at this time.  She was seen last week for an episode of chest pain that had completely resolved.  Patient is otherwise ambulatory tolerating p.o. intake.  She has been waking up every few hours overnight with worsening coughing tonight and comes in for further care and management.  Patient's recorded medical, surgical, social, medication list and allergies were reviewed in the Snapshot window as part of the initial history.   Review of Systems   Review of Systems  Constitutional:  Negative for chills and fever.  HENT:  Negative for ear pain and sore throat.   Eyes:  Negative for pain and visual disturbance.  Respiratory:  Positive for shortness of breath and wheezing. Negative for cough.   Cardiovascular:  Negative for chest pain and palpitations.  Gastrointestinal:  Negative for abdominal pain and vomiting.  Genitourinary:  Negative for dysuria and hematuria.  Musculoskeletal:  Negative for arthralgias and back pain.  Skin:  Negative for color change and rash.  Neurological:  Negative for seizures and syncope.  All other systems reviewed and are negative.   Physical Exam Updated Vital Signs BP (!) 169/94   Pulse 93   Temp 98.2 F (36.8 C) (Oral)   Resp 18   Ht 5\' 1"  (1.549 m)   Wt 55.2 kg    SpO2 93%   BMI 22.99 kg/m  Physical Exam Vitals and nursing note reviewed.  Constitutional:      General: She is not in acute distress.    Appearance: She is well-developed.  HENT:     Head: Normocephalic and atraumatic.  Eyes:     Conjunctiva/sclera: Conjunctivae normal.  Cardiovascular:     Rate and Rhythm: Normal rate and regular rhythm.     Heart sounds: No murmur heard. Pulmonary:     Effort: Pulmonary effort is normal. Tachypnea present. No respiratory distress.     Breath sounds: Wheezing present.  Abdominal:     Palpations: Abdomen is soft.     Tenderness: There is no abdominal tenderness.  Musculoskeletal:        General: No swelling.     Cervical back: Neck supple.  Skin:    General: Skin is warm and dry.     Capillary Refill: Capillary refill takes less than 2 seconds.  Neurological:     Mental Status: She is alert.  Psychiatric:        Mood and Affect: Mood normal.      ED Course/ Medical Decision Making/ A&P    Procedures Procedures   Medications Ordered in ED Medications  enoxaparin (LOVENOX) injection 30 mg (30 mg Subcutaneous Given 03/25/22 1155)  doxycycline (VIBRA-TABS) tablet 100 mg (100 mg Oral Given 03/25/22 1154)  methylPREDNISolone sodium succinate (SOLU-MEDROL) 40 mg/mL injection 40 mg (40  mg Intravenous Given 03/25/22 1154)  albuterol (PROVENTIL) (2.5 MG/3ML) 0.083% nebulizer solution 2.5 mg (has no administration in time range)  ipratropium-albuterol (DUONEB) 0.5-2.5 (3) MG/3ML nebulizer solution 3 mL (3 mLs Nebulization Given 03/25/22 1309)  dextromethorphan-guaiFENesin (MUCINEX DM) 30-600 MG per 12 hr tablet 1 tablet (1 tablet Oral Given 03/25/22 1154)  budesonide (PULMICORT) nebulizer solution 0.5 mg (0.5 mg Nebulization Given 03/25/22 1309)  arformoterol (BROVANA) nebulizer solution 15 mcg (15 mcg Nebulization Given 03/25/22 1309)  pantoprazole (PROTONIX) EC tablet 40 mg (40 mg Oral Given 03/25/22 1154)  sodium chloride flush (NS) 0.9  % injection 3 mL (has no administration in time range)  sodium chloride flush (NS) 0.9 % injection 3 mL (has no administration in time range)  0.9 %  sodium chloride infusion (has no administration in time range)  acetaminophen (TYLENOL) tablet 650 mg (has no administration in time range)    Or  acetaminophen (TYLENOL) suppository 650 mg (has no administration in time range)  ondansetron (ZOFRAN) tablet 4 mg (has no administration in time range)    Or  ondansetron (ZOFRAN) injection 4 mg (has no administration in time range)  amLODipine (NORVASC) tablet 10 mg (has no administration in time range)  atorvastatin (LIPITOR) tablet 40 mg (40 mg Oral Given 03/25/22 1154)  clopidogrel (PLAVIX) tablet 75 mg (75 mg Oral Given 03/25/22 1154)  cyclobenzaprine (FLEXERIL) tablet 10 mg (has no administration in time range)  hydrALAZINE (APRESOLINE) injection 10 mg (has no administration in time range)  0.9 %  sodium chloride infusion ( Intravenous New Bag/Given 03/25/22 1244)  guaiFENesin-dextromethorphan (ROBITUSSIN DM) 100-10 MG/5ML syrup 5 mL (5 mLs Oral Given 03/25/22 1416)  benzonatate (TESSALON) capsule 200 mg (has no administration in time range)  ipratropium-albuterol (DUONEB) 0.5-2.5 (3) MG/3ML nebulizer solution 3 mL (3 mLs Nebulization Given 03/25/22 0834)  magnesium sulfate IVPB 2 g 50 mL (0 g Intravenous Stopped 03/25/22 0902)  amLODipine (NORVASC) tablet 10 mg (10 mg Oral Given 03/25/22 0804)  benzonatate (TESSALON) capsule 100 mg (100 mg Oral Given 03/25/22 1154)   Medical Decision Making:   [name] is a 81 y.o. female with a history of COPD, who presented to the ED today with acute on chronic SOB. They are endorsing worsening of their baseline dyspnea over the past 72 hours. Their baseline is a 0L O2 requirement. At their baseline they are able to get around the  neighborhood and they are not able to at this time.   On my initial exam, the pt was SOB and tachypneic. Audible  wheezing and grossly decreased breath sounds appreciated.  They are endorsing increased sputum production.    Reviewed and confirmed nursing documentation for past medical history, family history, social history.    Initial Assessment:   With the patient's presentation of SOB in the above setting, most likely diagnosis is COPD Exacerbation. Other diagnoses were considered including (but not limited to) CAP, PE, ACS, viral infection, PTX. These are considered less likely due to history of present illness and physical exam findings.   This is most consistent with an acute life/limb threatening illness complicated by underlying chronic conditions.  Initial Plan:  Empiric treatment of patient's symptoms with immediate initiation of inhaled bronchodilators and IV steroids. Given advanced nature of patient's presentation, will proceed with IV magnesium as a rescue therapy.   Evaluation for ACS with EKG and delta troponin  Evaluation for infectious versus intrathoracic abnormality with chest x-ray  Evaluation for volume overload with BNP  Screening labs including  CBC and Metabolic panel to evaluate for infectious or metabolic etiology of disease.  Patient's Wells score is lo and patient does not warrant further objective evaluation for PE based on consistency of presentation of alternative diagnosis.  Objective evaluation as below reviewed   Initial Study Results:   Laboratory  All laboratory results reviewed without evidence of clinically relevant pathology.    EKG EKG was reviewed independently. Rate, rhythm, axis, intervals all examined and without medically relevant abnormality. ST segments without concerns for elevations.    Radiology:  All images reviewed independently. Agree with radiology report at this time.   DG Chest Portable 1 View  Result Date: 03/25/2022 CLINICAL DATA:  Short of breath EXAM: PORTABLE CHEST 1 VIEW COMPARISON:  Prior chest x-ray 03/12/2022 FINDINGS: Cardiac and  mediastinal contours are unchanged. Atherosclerotic calcifications present in the transverse aorta. The lungs are clear save for mild chronic bronchitic changes. No pneumothorax or pleural effusion. No acute osseous abnormality. Prior cervicothoracic anterior stabilization hardware. IMPRESSION: No active disease. Electronically Signed   By: Malachy Moan M.D.   On: 03/25/2022 08:11   DG Chest Portable 1 View  Result Date: 03/12/2022 CLINICAL DATA:  Epigastric chest pain. EXAM: PORTABLE CHEST 1 VIEW COMPARISON:  03/22/2020. FINDINGS: The heart size and mediastinal contours are within normal limits. There is atherosclerotic calcification of the aorta. Hyperinflation of the lungs is noted. No consolidation, effusion, or pneumothorax. Degenerative changes are present in the thoracic spine. Cervical spinal fusion hardware is noted. IMPRESSION: Hyperinflation of the lungs with no acute process. Electronically Signed   By: Thornell Sartorius M.D.   On: 03/12/2022 04:09      Consults: Case discussed with hospitalist.   Final Assessment and Plan:   Given the advanced nature of the patient's prenetation, patient will require advanced care and admission was arranged.      Clinical Impression:  1. COPD exacerbation (HCC)      Admit   Clinical Impression:  1. COPD exacerbation (HCC)      Admit   Final Clinical Impression(s) / ED Diagnoses Final diagnoses:  COPD exacerbation (HCC)    Rx / DC Orders ED Discharge Orders     None         Glyn Ade, MD 03/25/22 1501

## 2022-03-25 NOTE — Progress Notes (Signed)
1657 Patient eating unavailable for nebulizer at this time.

## 2022-03-25 NOTE — Assessment & Plan Note (Signed)
-  Continue current antihypertensive agents. -Continue holding ARB and HCTZ in the setting of acute kidney injury; and recovery process. -Patient advised to maintain adequate hydration. -Heart healthy diet discussed with patient. -Repeat basic metabolic panel at follow-up visit to reassess renal function and stability.

## 2022-03-25 NOTE — Assessment & Plan Note (Signed)
-  Presenting with mild acute kidney injury on chronic kidney disease a stage IIIa at baseline. -Patient advised to maintain adequate hydration -Continue holding nephrotoxic agents for the next 7 days to fully allow recovery of her renal function. -Repeat basic metabolic panel at follow-up visit to assess stability. -Patient denies dysuria or complaints of urinary retention -Patient has been advised to maintain adequate hydration.

## 2022-03-26 DIAGNOSIS — I10 Essential (primary) hypertension: Secondary | ICD-10-CM | POA: Diagnosis not present

## 2022-03-26 DIAGNOSIS — N1831 Chronic kidney disease, stage 3a: Secondary | ICD-10-CM | POA: Diagnosis not present

## 2022-03-26 DIAGNOSIS — E782 Mixed hyperlipidemia: Secondary | ICD-10-CM | POA: Diagnosis not present

## 2022-03-26 DIAGNOSIS — K219 Gastro-esophageal reflux disease without esophagitis: Secondary | ICD-10-CM | POA: Diagnosis not present

## 2022-03-26 DIAGNOSIS — N179 Acute kidney failure, unspecified: Secondary | ICD-10-CM | POA: Diagnosis not present

## 2022-03-26 DIAGNOSIS — J441 Chronic obstructive pulmonary disease with (acute) exacerbation: Secondary | ICD-10-CM | POA: Diagnosis not present

## 2022-03-26 LAB — BASIC METABOLIC PANEL
Anion gap: 9 (ref 5–15)
BUN: 26 mg/dL — ABNORMAL HIGH (ref 8–23)
CO2: 23 mmol/L (ref 22–32)
Calcium: 8.5 mg/dL — ABNORMAL LOW (ref 8.9–10.3)
Chloride: 109 mmol/L (ref 98–111)
Creatinine, Ser: 1.23 mg/dL — ABNORMAL HIGH (ref 0.44–1.00)
GFR, Estimated: 45 mL/min — ABNORMAL LOW (ref >60)
Glucose, Bld: 149 mg/dL — ABNORMAL HIGH (ref 70–99)
Potassium: 4.3 mmol/L (ref 3.5–5.1)
Sodium: 141 mmol/L (ref 135–145)

## 2022-03-26 LAB — CBC
HCT: 45.8 % (ref 36.0–46.0)
Hemoglobin: 14.8 g/dL (ref 12.0–15.0)
MCH: 30.5 pg (ref 26.0–34.0)
MCHC: 32.3 g/dL (ref 30.0–36.0)
MCV: 94.4 fL (ref 80.0–100.0)
Platelets: 152 10*3/uL (ref 150–400)
RBC: 4.85 MIL/uL (ref 3.87–5.11)
RDW: 13.1 % (ref 11.5–15.5)
WBC: 5.3 10*3/uL (ref 4.0–10.5)
nRBC: 0 % (ref 0.0–0.2)

## 2022-03-26 MED ORDER — SODIUM CHLORIDE 0.9 % IV SOLN: INTRAVENOUS | Status: AC

## 2022-03-26 MED ORDER — NICOTINE 21 MG/24HR TD PT24
21.0000 mg | MEDICATED_PATCH | Freq: Every day | TRANSDERMAL | Status: DC
  Administered 2022-03-26 – 2022-03-27 (×2): 21 mg via TRANSDERMAL
  Filled 2022-03-26 (×2): qty 1

## 2022-03-26 NOTE — Progress Notes (Signed)
SATURATION QUALIFICATIONS: (This note is used to comply with regulatory documentation for home oxygen) ° °Patient Saturations on Room Air at Rest = 98% ° °Patient Saturations on Room Air while Ambulating = 96% ° °Please briefly explain why patient needs home oxygen: °

## 2022-03-26 NOTE — Progress Notes (Signed)
Progress Note   Patient: [name] UEA:[id number] DOB: [birth year] DOA: 03/25/2022     0 DOS: the patient was seen and examined on 03/26/2022   Brief hospital course: [name] is a 81 y.o. female with medical history significant of gastroesophageal reflux disease, hypertension, COPD, and prior history of a stroke without significant residual deficits; who presented to the hospital secondary to increased shortness of breath and worsening wheezing.  Patient reports symptom has been present for the last 3-4 days and worsening.  Home inhaler management has not help solving the problem.   Patient denies chest pain, nausea, vomiting, fever, chills, sick contacts, dysuria, hematuria, melena, hematochezia or any other complaints.   At the time EMS arrived to her house patient was found to be in the mid 80s oxygen saturation on room air.  2 L nasal cannula supplementation were applied and a steroids/albuterol nebulizer provided.  Patient transported to the hospital for further evaluation and management.  Diffuse expiratory wheezing with decreased breath sounds appreciated on examination and every time patient in brace into activity or wean off from oxygen supplementation attempted she developed tachypnea and worsening breathing.  Chest x-ray without signs of cardiopulmonary process.     Steroids and bronchodilators provided.  TRH consulted to place patient in the hospital for further evaluation and management of COPD exacerbation.  Assessment and Plan: * COPD with acute exacerbation ([place]) -Requiring oxygen supplementation (2 L) at time of admission -Demonstrating improvement in her symptoms and currently at rest, not oxygen required. -Chest x-ray without acute infiltrates -Tobacco cessation counseling provided -Continue treatment with doxycycline, bronchodilator management, flutter valve, nebulization treatments and the use of his steroids -Flutter valve started.   -Continue mucolytic's  and antitussive drugs -COVID/influenza PCR negative. -Hopefully able to go home on 03/27/2022.   AKI (acute kidney injury) ([place]) -continue to Minimize nephrotoxic agents, avoid hypotension and the use of contrast. -Judicious fluid resuscitation provided -No urinary retention or complaints of dysuria to suggest UTI. -continue to Follow renal function trend.  GERD (gastroesophageal reflux disease) -Continue PPI.  Stage 3a chronic kidney disease ([place]) -Presenting with mild acute kidney injury on chronic kidney disease a stage IIIa at baseline. -Judicious fluid resuscitation provided -Continue holding nephrotoxic agents overnight -Follow renal function trend. -Patient denies dysuria or complaints of urinary retention -Advised to maintain adequate hydration.  Hyperlipidemia -Continue statin -Heart healthy diet discussed with patient.  Essential hypertension with goal blood pressure less than 130/80 - Continue current antihypertensive agents -Continue holding ARB and HCTZ in the setting of acute kidney injury -Maintain adequate hydration. -Follow-up vital signs. -As needed hydralazine has been ordered -Heart healthy diet discussed with patient.    Subjective:  Afebrile, no chest pain, no nausea, no vomiting.  Still short winded with activity, experiencing intermittent episode of coughing spells and having mild difficulty speaking in full sentences.  Physical Exam: Vitals:   03/26/22 1059 03/26/22 1202 03/26/22 1229 03/26/22 1550  BP:   (!) 157/85   Pulse:  (!) 102 100   Resp:   17   Temp:   98.5 F (36.9 C)   TempSrc:      SpO2: 97% 96% 100% 99%  Weight:      Height:       General exam: Alert, awake, oriented x 3; reported still short winded sensation with activity and ongoing coughing spells.  No fever, no nausea, no vomiting. Respiratory system: Fair air movement, positive rhonchi, positive expiratory wheezing.  At rest not requiring oxygen  supplementation. Cardiovascular system:RRR. No rubs or gallops. Gastrointestinal system: Abdomen is nondistended, soft and nontender. No organomegaly or masses felt. Normal bowel sounds heard. Central nervous system: Alert and oriented. No focal neurological deficits. Extremities: No cyanosis or clubbing; no edema. Skin: No petechiae. Psychiatry: Judgement and insight appear normal. Mood & affect appropriate.   Data Reviewed: CBC: WBCs 5.3, hemoglobin 14.8 and platelet count 152 K Basic metabolic panel: Sodium 141, potassium 4.3, chloride 109, BUN 26, creatinine 1.23, GFR 45 and CBGs in the 140s range.  Family Communication: No family at bedside.  Disposition: Status is: Observation The patient remains OBS appropriate and will d/c before 2 midnights.  Anticipating discharge on 03/27/2022.   Planned Discharge Destination: Home  Time spent: 35 minutes  Author: Vassie Loll, MD 03/26/2022 4:35 PM  For on call review [email or web address].

## 2022-03-26 NOTE — Progress Notes (Addendum)
  Transition of Care [facility]) Screening Note   Patient Details  Name: [name] Date of Birth: 1941/05/20   Transition of Care [facility]) CM/SW Contact:    Iona Beard, [place] Phone Number: 03/26/2022, 11:03 AM  TOC noted that pt is listed as high risk for food insecurity on the SDOH. CSW spoke with pt who states that she has a Ucard from [employer] that assists her in getting meals. Pt has no issues with food insecurity at this time.   Transition of Care Department [facility]) has reviewed patient and no TOC needs have been identified at this time. We will continue to monitor patient advancement through interdisciplinary progression rounds. If new patient transition needs arise, please place a TOC consult.

## 2022-03-26 NOTE — Progress Notes (Signed)
Patient walked in the hall at her request, oxygen saturation stayed in the upper 90s but patient did get short winded. She is resting comfortably at this time.

## 2022-03-27 DIAGNOSIS — Z90710 Acquired absence of both cervix and uterus: Secondary | ICD-10-CM | POA: Diagnosis not present

## 2022-03-27 DIAGNOSIS — Z7951 Long term (current) use of inhaled steroids: Secondary | ICD-10-CM | POA: Diagnosis not present

## 2022-03-27 DIAGNOSIS — N1831 Chronic kidney disease, stage 3a: Secondary | ICD-10-CM | POA: Diagnosis not present

## 2022-03-27 DIAGNOSIS — I129 Hypertensive chronic kidney disease with stage 1 through stage 4 chronic kidney disease, or unspecified chronic kidney disease: Secondary | ICD-10-CM | POA: Diagnosis present

## 2022-03-27 DIAGNOSIS — E785 Hyperlipidemia, unspecified: Secondary | ICD-10-CM | POA: Diagnosis present

## 2022-03-27 DIAGNOSIS — Z8249 Family history of ischemic heart disease and other diseases of the circulatory system: Secondary | ICD-10-CM | POA: Diagnosis not present

## 2022-03-27 DIAGNOSIS — K219 Gastro-esophageal reflux disease without esophagitis: Secondary | ICD-10-CM | POA: Diagnosis not present

## 2022-03-27 DIAGNOSIS — Z7902 Long term (current) use of antithrombotics/antiplatelets: Secondary | ICD-10-CM | POA: Diagnosis not present

## 2022-03-27 DIAGNOSIS — J441 Chronic obstructive pulmonary disease with (acute) exacerbation: Secondary | ICD-10-CM | POA: Diagnosis not present

## 2022-03-27 DIAGNOSIS — I10 Essential (primary) hypertension: Secondary | ICD-10-CM | POA: Diagnosis not present

## 2022-03-27 DIAGNOSIS — Z20822 Contact with and (suspected) exposure to covid-19: Secondary | ICD-10-CM | POA: Diagnosis present

## 2022-03-27 DIAGNOSIS — E782 Mixed hyperlipidemia: Secondary | ICD-10-CM | POA: Diagnosis not present

## 2022-03-27 DIAGNOSIS — Z79899 Other long term (current) drug therapy: Secondary | ICD-10-CM | POA: Diagnosis not present

## 2022-03-27 DIAGNOSIS — F17210 Nicotine dependence, cigarettes, uncomplicated: Secondary | ICD-10-CM | POA: Diagnosis present

## 2022-03-27 DIAGNOSIS — Z825 Family history of asthma and other chronic lower respiratory diseases: Secondary | ICD-10-CM | POA: Diagnosis not present

## 2022-03-27 DIAGNOSIS — N179 Acute kidney failure, unspecified: Secondary | ICD-10-CM | POA: Diagnosis not present

## 2022-03-27 DIAGNOSIS — Z8673 Personal history of transient ischemic attack (TIA), and cerebral infarction without residual deficits: Secondary | ICD-10-CM | POA: Diagnosis not present

## 2022-03-27 LAB — BASIC METABOLIC PANEL
Anion gap: 6 (ref 5–15)
BUN: 33 mg/dL — ABNORMAL HIGH (ref 8–23)
CO2: 21 mmol/L — ABNORMAL LOW (ref 22–32)
Calcium: 8.4 mg/dL — ABNORMAL LOW (ref 8.9–10.3)
Chloride: 112 mmol/L — ABNORMAL HIGH (ref 98–111)
Creatinine, Ser: 1.27 mg/dL — ABNORMAL HIGH (ref 0.44–1.00)
GFR, Estimated: 43 mL/min — ABNORMAL LOW (ref >60)
Glucose, Bld: 130 mg/dL — ABNORMAL HIGH (ref 70–99)
Potassium: 4.7 mmol/L (ref 3.5–5.1)
Sodium: 139 mmol/L (ref 135–145)

## 2022-03-27 MED ORDER — ARFORMOTEROL TARTRATE 15 MCG/2ML IN NEBU: 15.0000 ug | INHALATION_SOLUTION | Freq: Two times a day (BID) | RESPIRATORY_TRACT | 1 refills | Status: DC

## 2022-03-27 MED ORDER — PANTOPRAZOLE SODIUM 40 MG PO TBEC: 40.0000 mg | DELAYED_RELEASE_TABLET | Freq: Every day | ORAL | 2 refills | Status: DC

## 2022-03-27 MED ORDER — PREDNISONE 20 MG PO TABS: ORAL_TABLET | ORAL | 0 refills | Status: DC

## 2022-03-27 MED ORDER — BUDESONIDE 0.5 MG/2ML IN SUSP: 0.5000 mg | Freq: Two times a day (BID) | RESPIRATORY_TRACT | 0 refills | Status: DC

## 2022-03-27 MED ORDER — IPRATROPIUM-ALBUTEROL 0.5-2.5 (3) MG/3ML IN SOLN: 3.0000 mL | Freq: Four times a day (QID) | RESPIRATORY_TRACT | 2 refills | Status: DC | PRN

## 2022-03-27 MED ORDER — NICOTINE 21 MG/24HR TD PT24: 21.0000 mg | MEDICATED_PATCH | Freq: Every day | TRANSDERMAL | 0 refills | Status: DC

## 2022-03-27 MED ORDER — DOXYCYCLINE HYCLATE 100 MG PO TABS: 100.0000 mg | ORAL_TABLET | Freq: Two times a day (BID) | ORAL | 0 refills | Status: AC

## 2022-03-27 MED ORDER — VALSARTAN-HYDROCHLOROTHIAZIDE 160-12.5 MG PO TABS: 1.0000 | ORAL_TABLET | Freq: Every day | ORAL | Status: DC

## 2022-03-27 MED ORDER — DM-GUAIFENESIN ER 30-600 MG PO TB12: 1.0000 | ORAL_TABLET | Freq: Two times a day (BID) | ORAL | 0 refills | Status: AC

## 2022-03-27 MED ORDER — BENZONATATE 200 MG PO CAPS: 200.0000 mg | ORAL_CAPSULE | Freq: Three times a day (TID) | ORAL | 0 refills | Status: DC | PRN

## 2022-03-27 NOTE — TOC Transition Note (Signed)
Transition of Care [facility]) - CM/SW Discharge Note   Patient Details  Name: [name] MRN: [id number] Date of Birth: 04/05/1941  Transition of Care [facility]) CM/SW Contact:  Salome Arnt, LCSW Phone Number: 03/27/2022, 12:57 PM   Clinical Narrative:  Pt will need nebulizer. Discussed with pt who states she had one that stopped working just before coming to hospital. Pt said she ordered it from a tv ad and not through insurance. Pt has no preference on agency. Referred to [facility] with [employer] and nebulizer will be delivered to pt's room before d/c. MD and RN updated. No other needs reported by pt.     Final next level of care: Home/Self Care Barriers to Discharge: Continued Medical Work up   Patient Goals and CMS Choice        Discharge Placement                    Patient and family notified of of transfer: 03/27/22  Discharge Plan and Services                DME Arranged: Nebulizer/meds DME Agency: [employer] Date DME Agency Contacted: 03/27/22 Time DME Agency Contacted: [id number] Representative spoke with at DME Agency: [place] ([place]) Interventions Food Insecurity Interventions: Inpatient TOC (Patient recieves assistance with food through [place] from [employer] benefits.)   Readmission Risk Interventions     No data to display

## 2022-03-27 NOTE — Progress Notes (Signed)
Patient discharge instructions went over with patient, removed IV from RFA , tolerated well. Follow up appointments and medication went over with patient. Information on smoking cessation provided. Patient verbalized understanding , currently awaiting ride from family

## 2022-03-27 NOTE — Discharge Summary (Signed)
Physician Discharge Summary   Patient: [name] MRN: [id number] DOB: 12/13/1940  Admit date:     03/25/2022  Discharge date: 03/27/22  Discharge Physician: Barton Dubois   PCP: Claretta Fraise, MD   Recommendations at discharge:  Repeat basic metabolic panel to follow electrolytes and renal function Reassess blood pressure and adjust antihypertensive treatment as needed Make sure patient follow-up with pulmonologist for PFTs and further adjustment to maintenance therapy of her COPD. Continue assisting patient with tobacco cessation.  Discharge Diagnoses: Principal Problem:   COPD exacerbation ([place]) Active Problems:   Essential hypertension with goal blood pressure less than 130/80   Hyperlipidemia   Stage 3a chronic kidney disease (HCC)   GERD (gastroesophageal reflux disease)   AKI (acute kidney injury) [facility])   Brief Hospital admission course: [name] is a 81 y.o. female with medical history significant of gastroesophageal reflux disease, hypertension, COPD, and prior history of a stroke without significant residual deficits; who presented to the hospital secondary to increased shortness of breath and worsening wheezing.  Patient reports symptom has been present for the last 3-4 days and worsening.  Home inhaler management has not help solving the problem.   Patient denies chest pain, nausea, vomiting, fever, chills, sick contacts, dysuria, hematuria, melena, hematochezia or any other complaints.   At the time EMS arrived to her house patient was found to be in the mid 80s oxygen saturation on room air.  2 L nasal cannula supplementation were applied and a steroids/albuterol nebulizer provided.  Patient transported to the hospital for further evaluation and management.  Diffuse expiratory wheezing with decreased breath sounds appreciated on examination and every time patient in brace into activity or wean off from oxygen supplementation attempted she developed tachypnea  and worsening breathing.  Chest x-ray without signs of cardiopulmonary process.     Steroids and bronchodilators provided.  TRH consulted to place patient in the hospital for further evaluation and management of COPD exacerbation.  Assessment and Plan: * COPD exacerbation ([place]) -Requiring oxygen supplementation (2 L) at time of admission -Demonstrating improvement in her symptoms and at discharge no oxygen supplementation was required. -Chest x-ray without acute infiltrates. -Tobacco cessation counseling provided. -Continue treatment with doxycycline, bronchodilator/nebulizer management, flutter valve and the use of steroids tapering. -Outpatient follow-up with pulmonologist for PFTs and further adjustment to maintenance therapy recommended. -Continue mucolytic's and antitussive drugs. -COVID/influenza PCR negative. -Nebulizer DME provided at discharge.   AKI (acute kidney injury) ([place]) -continue to Minimize nephrotoxic agents, avoid hypotension and the use of contrast. -Maintain adequate hydration -No urinary retention or complaints of dysuria to suggest UTI. -continue to Follow renal function trend. -Repeat basic metabolic panel follow-up visit to assess recovery and stability.  GERD (gastroesophageal reflux disease) -Continue PPI.  Stage 3a chronic kidney disease ([place]) -Presenting with mild acute kidney injury on chronic kidney disease a stage IIIa at baseline. -Patient advised to maintain adequate hydration -Continue holding nephrotoxic agents for the next 7 days to fully allow recovery of her renal function. -Repeat basic metabolic panel at follow-up visit to assess stability. -Patient denies dysuria or complaints of urinary retention -Patient has been advised to maintain adequate hydration.  Hyperlipidemia -Continue statin -Heart healthy diet discussed with patient.  Essential hypertension with goal blood pressure less than 130/80 -Continue current antihypertensive  agents. -Continue holding ARB and HCTZ in the setting of acute kidney injury; and recovery process. -Patient advised to maintain adequate hydration. -Heart healthy diet discussed with patient. -Repeat basic metabolic panel at  follow-up visit to reassess renal function and stability.   Consultants: None Procedures performed: See below for x-ray reports Disposition: Home Diet recommendation: Heart healthy diet.  DISCHARGE MEDICATION: Allergies as of 03/27/2022       Reactions   Penicillins Anaphylaxis, Swelling, Rash, Other (See Comments)   Has patient had a PCN reaction causing immediate rash, facial/tongue/throat swelling, SOB or lightheadedness with hypotension: yes Has patient had a PCN reaction causing severe rash involving mucus membranes or skin necrosis: no Has patient had a PCN reaction that required hospitalization: yes Has patient had a PCN reaction occurring within the last 10 years: no If all of the above answers are "NO", then may proceed with Cephalosporin use.        Medication List     STOP taking these medications    levalbuterol 45 MCG/ACT inhaler Commonly known as: XOPENEX HFA   Symbicort 160-4.5 MCG/ACT inhaler Generic drug: budesonide-formoterol       TAKE these medications    amLODipine 10 MG tablet Commonly known as: NORVASC Take 1 tablet (10 mg total) by mouth daily.   arformoterol 15 MCG/2ML Nebu Commonly known as: BROVANA Take 2 mLs (15 mcg total) by nebulization 2 (two) times daily.   atorvastatin 40 MG tablet Commonly known as: LIPITOR Take 1 tablet (40 mg total) by mouth daily.   benzonatate 200 MG capsule Commonly known as: TESSALON Take 1 capsule (200 mg total) by mouth 3 (three) times daily as needed (excessive cough).   budesonide 0.5 MG/2ML nebulizer solution Commonly known as: PULMICORT Take 2 mLs (0.5 mg total) by nebulization 2 (two) times daily.   clopidogrel 75 MG tablet Commonly known as: PLAVIX TAKE ONE TABLET  ONCE DAILY   cyclobenzaprine 5 MG tablet Commonly known as: FLEXERIL TAKE ONE TABLET THREE TIMES DAILY AS NEEDED FOR MUSCLE SPASMS What changed: See the new instructions.   dextromethorphan-guaiFENesin 30-600 MG 12hr tablet Commonly known as: MUCINEX DM Take 1 tablet by mouth 2 (two) times daily for 10 days.   doxycycline 100 MG tablet Commonly known as: VIBRA-TABS Take 1 tablet (100 mg total) by mouth every 12 (twelve) hours for 4 days.   ipratropium-albuterol 0.5-2.5 (3) MG/3ML Soln Commonly known as: DUONEB Take 3 mLs by nebulization every 6 (six) hours as needed (SOB and/or wheezing).   meloxicam 15 MG tablet Commonly known as: MOBIC Take 15 mg by mouth daily.   nicotine 21 mg/24hr patch Commonly known as: NICODERM CQ - dosed in mg/24 hours Place 1 patch (21 mg total) onto the skin daily. Start taking on: March 28, 2022   pantoprazole 40 MG tablet Commonly known as: PROTONIX Take 1 tablet (40 mg total) by mouth daily. Start taking on: March 28, 2022   predniSONE 20 MG tablet Commonly known as: DELTASONE Take 3 tablets by mouth daily x1 day; then 2 tablets by mouth daily x2 days; then 1 tablet by mouth daily x3-day; then half tablet by mouth daily x3 days and stop prednisone.   valsartan-hydrochlorothiazide 160-12.5 MG tablet Commonly known as: Diovan HCT Take 1 tablet by mouth daily. Start taking on: April 03, 2022 What changed: These instructions start on April 03, 2022. If you are unsure what to do until then, ask your doctor or other care provider.               Durable Medical Equipment  (From admission, onward)           Start     Ordered   03/27/22  1246  For home use only DME Nebulizer machine  Once       Question Answer Comment  Patient needs a nebulizer to treat with the following condition COPD (chronic obstructive pulmonary disease) (HCC)   Length of Need Lifetime      03/27/22 1245            Follow-up Information      Mechele Claude, MD. Schedule an appointment as soon as possible for a visit in 10 day(s).   Specialty: Family Medicine Contact information: [address] [place] [address] [phone]                Discharge Exam: Ceasar Mons Weights   03/25/22 1226 03/26/22 0500 03/27/22 0640  Weight: 55.2 kg 54.6 kg 56.1 kg   General exam: Alert, awake, oriented x 3; in no acute distress, speaking in full sentences and not requiring oxygen supplementation.  Patient feeling ready to go home. Respiratory system: Positive scattered rhonchi; very little expiratory wheezing appreciated on exam.  No using accessory muscles.  Good saturation on room air.  Feeling ready to go home. Cardiovascular system:RRR. No rubs or gallops. Gastrointestinal system: Abdomen is nondistended, soft and nontender. No organomegaly or masses felt. Normal bowel sounds heard. Central nervous system: Alert and oriented. No focal neurological deficits. Extremities: No cyanosis or clubbing. Skin: No petechiae. Psychiatry: Judgement and insight appear normal. Mood & affect appropriate.    Condition at discharge: Stable and improved.  The results of significant diagnostics from this hospitalization (including imaging, microbiology, ancillary and laboratory) are listed below for reference.   Imaging Studies: DG Chest Portable 1 View  Result Date: 03/25/2022 CLINICAL DATA:  Short of breath EXAM: PORTABLE CHEST 1 VIEW COMPARISON:  Prior chest x-ray 03/12/2022 FINDINGS: Cardiac and mediastinal contours are unchanged. Atherosclerotic calcifications present in the transverse aorta. The lungs are clear save for mild chronic bronchitic changes. No pneumothorax or pleural effusion. No acute osseous abnormality. Prior cervicothoracic anterior stabilization hardware. IMPRESSION: No active disease. Electronically Signed   By: Malachy Moan M.D.   On: 03/25/2022 08:11   DG Chest Portable 1 View  Result Date: 03/12/2022 CLINICAL  DATA:  Epigastric chest pain. EXAM: PORTABLE CHEST 1 VIEW COMPARISON:  03/22/2020. FINDINGS: The heart size and mediastinal contours are within normal limits. There is atherosclerotic calcification of the aorta. Hyperinflation of the lungs is noted. No consolidation, effusion, or pneumothorax. Degenerative changes are present in the thoracic spine. Cervical spinal fusion hardware is noted. IMPRESSION: Hyperinflation of the lungs with no acute process. Electronically Signed   By: Thornell Sartorius M.D.   On: 03/12/2022 04:09    Microbiology: Results for orders placed or performed during the hospital encounter of 03/25/22  SARS Coronavirus 2 by RT PCR (hospital order, performed in [facility] hospital lab) *cepheid single result test* Anterior Nasal Swab     Status: None   Collection Time: 03/25/22  7:58 AM   Specimen: Anterior Nasal Swab  Result Value Ref Range Status   SARS Coronavirus 2 by RT PCR NEGATIVE NEGATIVE Final    Comment: (NOTE) SARS-CoV-2 target nucleic acids are NOT DETECTED.  The SARS-CoV-2 RNA is generally detectable in upper and lower respiratory specimens during the acute phase of infection. The lowest concentration of SARS-CoV-2 viral copies this assay can detect is 250 copies / mL. A negative result does not preclude SARS-CoV-2 infection and should not be used as the sole basis for treatment or other patient management decisions.  A negative result  may occur with improper specimen collection / handling, submission of specimen other than nasopharyngeal swab, presence of viral mutation(s) within the areas targeted by this assay, and inadequate number of viral copies (<250 copies / mL). A negative result must be combined with clinical observations, patient history, and epidemiological information.  Fact Sheet for Patients:   RoadLapTop.co.za  Fact Sheet for Healthcare Providers: http://kim-miller.com/  This test is not yet approved  or  cleared by the [place] FDA and has been authorized for detection and/or diagnosis of SARS-CoV-2 by FDA under an Emergency Use Authorization (EUA).  This EUA will remain in effect (meaning this test can be used) for the duration of the COVID-19 declaration under Section 564(b)(1) of the Act, 21 U.S.C. section 360bbb-3(b)(1), unless the authorization is terminated or revoked sooner.  Performed at [facility], [address], [place], [place] [address]     Labs: CBC: Recent Labs  Lab 03/25/22 0818 03/26/22 0410  WBC 5.2 5.3  NEUTROABS 3.1  --   HGB 16.1* 14.8  HCT 50.4* 45.8  MCV 95.5 94.4  PLT 190 152   Basic Metabolic Panel: Recent Labs  Lab 03/25/22 0818 03/25/22 1122 03/26/22 0410 03/27/22 0324  NA 143  --  141 139  K 3.8  --  4.3 4.7  CL 104  --  109 112*  CO2 29  --  23 21*  GLUCOSE 117*  --  149* 130*  BUN 16  --  26* 33*  CREATININE 1.24*  --  1.23* 1.27*  CALCIUM 9.3  --  8.5* 8.4*  MG  --  2.3  --   --   PHOS  --  3.3  --   --    Liver Function Tests: Recent Labs  Lab 03/25/22 0818  AST 20  ALT 10  ALKPHOS 45  BILITOT 0.7  PROT 7.6  ALBUMIN 4.1   CBG: No results for input(s): "GLUCAP" in the last 168 hours.  Discharge time spent: greater than 30 minutes.  Signed: Vassie Loll, MD [facility] 03/27/2022

## 2022-03-27 NOTE — Progress Notes (Signed)
Patient is resting in bed. Assisted x2 to the bathroom, bed exit is turned on. Patient tolerated well. No prn medications given during shift.

## 2022-03-28 ENCOUNTER — Telehealth (HOSPITAL_COMMUNITY): Payer: Self-pay | Admitting: *Deleted

## 2022-03-28 NOTE — Telephone Encounter (Signed)
Patient calling about her upcoming cardiac imaging study; pt verbalizes understanding of appt date/time, parking situation and where to check in, medications ordered, and verified current allergies; name and call back number provided for further questions should they arise  Gordy Clement RN Navigator Cardiac Imaging [facility] [phone] office [phone] cell  Patient to take 100mg  metoprolol tartrate two hours prior to her cardiac CT scan.  She is aware to arrive at 9:30am.

## 2022-03-29 ENCOUNTER — Telehealth: Payer: Self-pay

## 2022-03-29 NOTE — Telephone Encounter (Signed)
Transition Care Management Unsuccessful Follow-up Telephone Call  Date of discharge and from where:  03/27/2022 [name]   Attempts:  1st Attempt  Reason for unsuccessful TCM follow-up call:  No answer/busy

## 2022-03-30 ENCOUNTER — Ambulatory Visit (HOSPITAL_BASED_OUTPATIENT_CLINIC_OR_DEPARTMENT_OTHER)
Admission: RE | Admit: 2022-03-30 | Discharge: 2022-03-30 | Disposition: A | Discharge status: Home / Self Care | Payer: Medicare Other | Source: Ambulatory Visit | Attending: Cardiology | Admitting: Cardiology

## 2022-03-30 ENCOUNTER — Other Ambulatory Visit: Payer: Self-pay | Admitting: Cardiology

## 2022-03-30 ENCOUNTER — Ambulatory Visit (HOSPITAL_COMMUNITY)
Admission: RE | Admit: 2022-03-30 | Discharge: 2022-03-30 | Disposition: A | Discharge status: Home / Self Care | Payer: Medicare Other | Source: Ambulatory Visit | Attending: Cardiovascular Disease | Admitting: Cardiovascular Disease

## 2022-03-30 DIAGNOSIS — J432 Centrilobular emphysema: Secondary | ICD-10-CM | POA: Diagnosis not present

## 2022-03-30 DIAGNOSIS — I2510 Atherosclerotic heart disease of native coronary artery without angina pectoris: Secondary | ICD-10-CM | POA: Diagnosis not present

## 2022-03-30 DIAGNOSIS — R931 Abnormal findings on diagnostic imaging of heart and coronary circulation: Secondary | ICD-10-CM | POA: Insufficient documentation

## 2022-03-30 DIAGNOSIS — I10 Essential (primary) hypertension: Secondary | ICD-10-CM | POA: Diagnosis not present

## 2022-03-30 DIAGNOSIS — R0789 Other chest pain: Secondary | ICD-10-CM | POA: Insufficient documentation

## 2022-03-30 MED ORDER — DILTIAZEM HCL 25 MG/5ML IV SOLN
INTRAVENOUS | Status: AC
  Filled 2022-03-30: qty 5

## 2022-03-30 MED ORDER — DILTIAZEM HCL 25 MG/5ML IV SOLN
10.0000 mg | INTRAVENOUS | Status: DC | PRN
  Administered 2022-03-30: 10 mg via INTRAVENOUS

## 2022-03-30 MED ORDER — DILTIAZEM HCL 25 MG/5ML IV SOLN
INTRAVENOUS | Status: AC
  Administered 2022-03-30: 10 mg via INTRAVENOUS
  Filled 2022-03-30: qty 5

## 2022-03-30 MED ORDER — NITROGLYCERIN 0.4 MG SL SUBL
SUBLINGUAL_TABLET | SUBLINGUAL | Status: AC
  Filled 2022-03-30: qty 2

## 2022-03-30 MED ORDER — NITROGLYCERIN 0.4 MG SL SUBL: 0.8000 mg | SUBLINGUAL_TABLET | Freq: Once | SUBLINGUAL | Status: DC

## 2022-03-30 MED ORDER — METOPROLOL TARTRATE 5 MG/5ML IV SOLN: 10.0000 mg | INTRAVENOUS | Status: DC | PRN

## 2022-03-30 MED ORDER — IOHEXOL 350 MG/ML SOLN
100.0000 mL | Freq: Once | INTRAVENOUS | Status: AC | PRN
  Administered 2022-03-30: 100 mL via INTRAVENOUS

## 2022-03-30 MED ORDER — METOPROLOL TARTRATE 5 MG/5ML IV SOLN
INTRAVENOUS | Status: AC
  Administered 2022-03-30: 10 mg via INTRAVENOUS
  Filled 2022-03-30: qty 20

## 2022-03-30 NOTE — Telephone Encounter (Signed)
Transition Care Management Unsuccessful Follow-up Telephone Call  Date of discharge and from where:  [name] 03/27/2022  Attempts:  2nd Attempt  Reason for unsuccessful TCM follow-up call:  No answer/busy

## 2022-03-31 DIAGNOSIS — J449 Chronic obstructive pulmonary disease, unspecified: Secondary | ICD-10-CM | POA: Diagnosis not present

## 2022-04-02 DIAGNOSIS — J449 Chronic obstructive pulmonary disease, unspecified: Secondary | ICD-10-CM | POA: Diagnosis not present

## 2022-04-03 ENCOUNTER — Telehealth (HOSPITAL_BASED_OUTPATIENT_CLINIC_OR_DEPARTMENT_OTHER): Payer: Self-pay | Admitting: *Deleted

## 2022-04-03 DIAGNOSIS — E785 Hyperlipidemia, unspecified: Secondary | ICD-10-CM

## 2022-04-03 NOTE — Telephone Encounter (Signed)
Advised patient of results, lab orders for lipid panel updated

## 2022-04-03 NOTE — Telephone Encounter (Signed)
-----   Message from Loel Dubonnet, NP sent at 04/02/2022  4:17 PM EST ----- Cardiac CT with coronary calcium score 396.  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  No flow limiting lesions.   Recommend aggressive secondary prevention.  Start aspirin EC 81 mg daily. Recommend LDL (bad cholestero less than 70 - last 01/2021 labs with LDL 76. Please obtain updated FLP (do not need CMP).

## 2022-04-11 ENCOUNTER — Encounter: Payer: Self-pay | Admitting: Pulmonary Disease

## 2022-04-11 ENCOUNTER — Ambulatory Visit (INDEPENDENT_AMBULATORY_CARE_PROVIDER_SITE_OTHER): Payer: Medicare Other | Admitting: Pulmonary Disease

## 2022-04-11 ENCOUNTER — Other Ambulatory Visit: Payer: Self-pay | Admitting: Nurse Practitioner

## 2022-04-11 VITALS — BP 128/68 | HR 73 | Ht 61.0 in | Wt 122.6 lb

## 2022-04-11 DIAGNOSIS — J4489 Other specified chronic obstructive pulmonary disease: Secondary | ICD-10-CM

## 2022-04-11 DIAGNOSIS — R0609 Other forms of dyspnea: Secondary | ICD-10-CM | POA: Diagnosis not present

## 2022-04-11 NOTE — Patient Instructions (Addendum)
Nice to meet you  Continue the arformoterol and the budesonide nebulizers twice a day every day  Use albuterol before you walk.  Use DuoNebs as needed if you feel short of breath around your house  Slowly increase the distance to walk.  I think it is very good for you to be active and to move around.  In the more you walk the better your shortness of breath will be.  We will get pulmonary function test to look into the possibility of COPD.  Return to clinic in 3 months or sooner as needed with Dr. Judeth Horn

## 2022-04-11 NOTE — Progress Notes (Signed)
@Patient  ID: [name], female    DOB: 10/23/1940, 81 y.o.   MRN: [id number]  Chief Complaint  Patient presents with   Consult    Consult for COPD. Pt states that the Pulmicort and duonebs help somewhat with her COPD. Pt states that she just got diagnosed with COPD this year.     Referring provider: Skeet Latch, MD  HPI:   81 y.o. woman whom we are seeing in consultation for evaluation of dyspnea on exertion and possible COPD.  Most recent cardiology note reviewed.  Most recent PCP note reviewed.  Discharge summary 02/2022 reviewed.  Patient notes long history of dyspnea on exertion.  Worse with stairs or inclines.  However she remains active.  Walking up to 3 miles daily.  Unfortunate, developed severe shortness of breath end of October 2023.  Noted to be hypoxemic on room air via EMS.  Treated with steroids and inhaled bronchodilators.  Chest x-ray on my review interpretation reveals clear lungs bilaterally 03/25/2022.  Prior chest x-ray 03/12/2022 shows clear lungs bilaterally on my review and interpretation, this preceded hospitalization.  She improved.  Stable on room air.  Discharged on prednisone taper.  She is continued arformoterol and budesonide nebulizer.  She felt this certainly helps.  She is albuterol prior to walking.  Relatively rare use of DuoNebs at the home.  She is not walking as far she was before but wants to get back up to longer walks.  She enjoys this.  She enjoys being outside, in nature, spends time praying.  She was seen by cardiology and a CT coronary scan was ordered to evaluate shortness of breath.  This did demonstrate coronary calcification is relatively significant with reported no significant flow limitation.  CT images 03/2022 demonstrate emphysema, thickened bronchioles, mosaicism, otherwise clear lungs on my review and interpretation.  PMH: Tobacco abuse in remission, hypertension, hyperlipidemia, GERD Surgical history: Hysterectomy, cataract  surgery, tonsillectomy Family history: Bipolar disease, asthma, CAD, son with bipolar disease social history: Former smoker, 25-pack-year, quit in fall 2023, lives in [place] / Pulmonary Flowsheets:   ACT:      No data to display          MMRC:     No data to display          Epworth:      No data to display          Tests:   FENO:  No results found for: "NITRICOXIDE"  PFT:     No data to display          WALK:      No data to display          Imaging: Personally reviewed and as per EMR and discussion in this note CT CORONARY FRACTIONAL FLOW RESERVE FLUID ANALYSIS  Result Date: 03/30/2022 EXAM: FFRCT ANALYSIS FINDINGS: FFRct analysis was performed on the original cardiac CT angiogram dataset. Diagrammatic representation of the FFRct analysis is provided in a separate PDF document in PACS. This dictation was created using the PDF document and an interactive 3D model of the results. 3D model is not available in the EMR/PACS. Normal FFR range is >0.80. 1. Left Main: No significant stenosis.  LM FFR = 1.00. 2. LAD: No significant stenosis. Proximal FFR = 0.99, Mid FFR = 0.94, Distal FFR = 0.86. 3. LCX: No significant stenosis. Proximal FFR = 1.00, Mid FFR = 0.97, Distal FFR = 0.96. 4. RCA: No significant stenosis. Proximal  FFR = 0.99, Mid FFR = 0.89, Distal FFR = 0.87. IMPRESSION: 1. Coronary CTA FFR analysis demonstrates no significant flow limiting lesions. Fransico Him, MD Electronically Signed   By: Fransico Him M.D.   On: 03/30/2022 21:43   CT CORONARY MORPH W/CTA COR W/SCORE W/CA W/CM &/OR WO/CM  Addendum Date: 03/30/2022   ADDENDUM REPORT: 03/30/2022 13:25 EXAM: OVER-READ INTERPRETATION  CT CHEST The following report is an over-read performed by radiologist Dr. Davina Poke of [facility], [place] on 03/30/2022. This over-read does not include interpretation of cardiac or coronary anatomy or pathology. The coronary  CTA interpretation by the cardiologist is attached. COMPARISON:  05/15/2021 FINDINGS: Normal heart size. No pericardial effusion. Image thoracic aorta is nonaneurysmal. Central pulmonary vasculature within normal limits. No adenopathy within the included chest. Imaged lung fields are clear. Mild centrilobular emphysema. Mild bronchial wall thickening. No acute bony or chest wall abnormality. IMPRESSION: Mild centrilobular emphysema and mild bronchial wall thickening. Emphysema (ICD10-J43.9). Electronically Signed   By: Davina Poke D.O.   On: 03/30/2022 13:25   Result Date: 03/30/2022 CLINICAL DATA:  Chest pain EXAM: Cardiac/Coronary CTA TECHNIQUE: A non-contrast, gated CT scan was obtained with axial slices of 3 mm through the heart for calcium scoring. Calcium scoring was performed using the Agatston method. A 120 kV prospective, gated, contrast cardiac scan was obtained. Gantry rotation speed was 250 msecs and collimation was 0.6 mm. Two sublingual nitroglycerin tablets (0.8 mg) were given. The 3D data set was reconstructed in 5% intervals of the 35-75% of the R-R cycle. Diastolic phases were analyzed on a dedicated workstation using MPR, MIP, and VRT modes. The patient received 95 cc of contrast. FINDINGS: Image quality: Fair due to significant slab artifact. Noise artifact is: Limited. Coronary Arteries:  Normal coronary origin.  Right dominance. Left main: The left main is a large caliber vessel with a normal take off from the left coronary cusp that bifurcates to form a left anterior descending artery and a left circumflex artery. There is no plaque or stenosis. Left anterior descending artery: The LAD gives off 1 patent diagonal branch. There is mild calcified plaque in the proximal, mid and distal LAD with associated stenosis of 25-49%. There is moderate mixed plaque in the mid D1 with associated stenosis of 50-69%. Left circumflex artery: The LCX is non-dominant and gives rise to 3 OM branches. There  is mild calcified plaque in the proximal LCx with associated stenosis of 25-49%. Right coronary artery: The RCA is dominant with normal take off from the right coronary cusp. The vessel is diffusely diseased in the proximal and mid portion with mixed and calcified plaque. There is mild to moderate calcified plaque in the proximal RCA with associated stenosis of 25-49% but could be >50%. There is mild calcified plaque in the distal RCA with associated stenosis of 25-49%. the RCA terminates as a PDA and right posterolateral branch without evidence of plaque or stenosis. Right Atrium: Right atrial size is within normal limits. Right Ventricle: The right ventricular cavity is within normal limits. Left Atrium: Left atrial size is normal in size with no left atrial appendage filling defect. Left Ventricle: The ventricular cavity size is within normal limits. There are no stigmata of prior infarction. There is no abnormal filling defect. Pulmonary arteries: Normal in size without proximal filling defect. Pulmonary veins: Normal pulmonary venous drainage. Pericardium: Normal thickness with no significant effusion or calcium present. Cardiac valves: The aortic valve is trileaflet without significant calcification. The mitral valve is  normal structure without significant calcification. Aorta: Normal caliber with scattered calcifications. Extra-cardiac findings: See attached radiology report for non-cardiac structures. IMPRESSION: 1. Coronary calcium score of 396. This was 85th percentile for age-, sex, and race-matched controls. 2.  Normal coronary origin with right dominance. 3.  Moderate atherosclerosis.  CAD RADS 3. 4. Consider symptom-guided anti-ischemic pharmacotherapy and risk factor modification per guideline-directed medical therapy. 5.  Study has been submitted for FFR analysis. RECOMMENDATIONS: 1. CAD-RADS 0: No evidence of CAD (0%). Consider non-atherosclerotic causes of chest pain. 2. CAD-RADS 1: Minimal  non-obstructive CAD (0-24%). Consider non-atherosclerotic causes of chest pain. Consider preventive therapy and risk factor modification. 3. CAD-RADS 2: Mild non-obstructive CAD (25-49%). Consider non-atherosclerotic causes of chest pain. Consider preventive therapy and risk factor modification. 4. CAD-RADS 3: Moderate stenosis. Consider symptom-guided anti-ischemic pharmacotherapy as well as risk factor modification per guideline directed care. Additional analysis with CT FFR will be submitted. 5. CAD-RADS 4: Severe stenosis. (70-99% or > 50% left main). Cardiac catheterization or CT FFR is recommended. Consider symptom-guided anti-ischemic pharmacotherapy as well as risk factor modification per guideline directed care. Invasive coronary angiography recommended with revascularization per published guideline statements. 6. CAD-RADS 5: Total coronary occlusion (100%). Consider cardiac catheterization or viability assessment. Consider symptom-guided anti-ischemic pharmacotherapy as well as risk factor modification per guideline directed care. 7. CAD-RADS N: Non-diagnostic study. Obstructive CAD can't be excluded. Alternative evaluation is recommended. Armanda Magic, MD Electronically Signed: By: Armanda Magic M.D. On: 03/30/2022 11:45   DG Chest Portable 1 View  Result Date: 03/25/2022 CLINICAL DATA:  Short of breath EXAM: PORTABLE CHEST 1 VIEW COMPARISON:  Prior chest x-ray 03/12/2022 FINDINGS: Cardiac and mediastinal contours are unchanged. Atherosclerotic calcifications present in the transverse aorta. The lungs are clear save for mild chronic bronchitic changes. No pneumothorax or pleural effusion. No acute osseous abnormality. Prior cervicothoracic anterior stabilization hardware. IMPRESSION: No active disease. Electronically Signed   By: Malachy Moan M.D.   On: 03/25/2022 08:11    Lab Results: Personally reviewed, notably is not as high as 500 02/2022 CBC    Component Value Date/Time   WBC 5.3  03/26/2022 0410   RBC 4.85 03/26/2022 0410   HGB 14.8 03/26/2022 0410   HGB 14.8 02/21/2021 0817   HCT 45.8 03/26/2022 0410   HCT 44.9 02/21/2021 0817   PLT 152 03/26/2022 0410   PLT 131 (L) 02/21/2021 0817   MCV 94.4 03/26/2022 0410   MCV 91 02/21/2021 0817   MCH 30.5 03/26/2022 0410   MCHC 32.3 03/26/2022 0410   RDW 13.1 03/26/2022 0410   RDW 12.2 02/21/2021 0817   LYMPHSABS 1.3 03/25/2022 0818   LYMPHSABS 2.1 02/21/2021 0817   MONOABS 0.2 03/25/2022 0818   EOSABS 0.5 03/25/2022 0818   EOSABS 0.2 02/21/2021 0817   BASOSABS 0.0 03/25/2022 0818   BASOSABS 0.0 02/21/2021 0817    BMET    Component Value Date/Time   NA 139 03/27/2022 0324   NA 144 02/21/2021 0817   K 4.7 03/27/2022 0324   CL 112 (H) 03/27/2022 0324   CO2 21 (L) 03/27/2022 0324   GLUCOSE 130 (H) 03/27/2022 0324   BUN 33 (H) 03/27/2022 0324   BUN 12 02/21/2021 0817   CREATININE 1.27 (H) 03/27/2022 0324   CALCIUM 8.4 (L) 03/27/2022 0324   GFRNONAA 43 (L) 03/27/2022 0324   GFRAA 53 (L) 01/23/2020 0700    BNP No results found for: "BNP"  ProBNP No results found for: "PROBNP"  Specialty Problems  Pulmonary Problems   COPD with chronic bronchitis   Aspiration pneumonia (HCC)   Acute respiratory failure with hypoxia (HCC)   Respiratory failure with hypoxia (HCC)   COPD exacerbation (HCC)    Allergies  Allergen Reactions   Penicillins Anaphylaxis, Swelling, Rash and Other (See Comments)    Has patient had a PCN reaction causing immediate rash, facial/tongue/throat swelling, SOB or lightheadedness with hypotension: yes Has patient had a PCN reaction causing severe rash involving mucus membranes or skin necrosis: no Has patient had a PCN reaction that required hospitalization: yes Has patient had a PCN reaction occurring within the last 10 years: no If all of the above answers are "NO", then may proceed with Cephalosporin use.     Immunization History  Administered Date(s) Administered    COVID-19, mRNA, vaccine(Comirnaty)12 years and older 03/06/2022   Fluad Quad(high Dose 65+) 02/26/2016, 02/25/2017, 02/26/2018, 02/24/2020, 02/21/2021   Influenza Split 02/13/2012   Influenza, High Dose Seasonal PF 02/19/2019   Influenza-Unspecified 03/28/2013   Moderna Covid-19 Vaccine Bivalent Booster 78yrs & up 07/18/2021   Moderna Sars-Covid-2 Vaccination 07/23/2019, 08/21/2019, 03/23/2020   Pneumococcal Conjugate-13 11/24/2014   Pneumococcal Polysaccharide-23 07/09/2016   Pneumococcal-Unspecified 07/09/2016   Tdap 11/24/2014   Zoster Recombinat (Shingrix) 12/27/2021    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   arformoterol (BROVANA) 15 MCG/2ML NEBU Take 2 mLs (15 mcg total) by nebulization 2 (two) times daily.   aspirin EC 81 MG tablet Take 81 mg by mouth daily. Swallow whole.   atorvastatin (LIPITOR) 40 MG tablet Take 1 tablet (40 mg total) by mouth daily.   benzonatate (TESSALON) 200 MG capsule Take 1 capsule (200 mg total) by mouth 3 (three) times daily as needed (excessive cough).   budesonide (PULMICORT) 0.5 MG/2ML nebulizer  solution Take 2 mLs (0.5 mg total) by nebulization 2 (two) times daily.   clopidogrel (PLAVIX) 75 MG tablet TAKE ONE TABLET ONCE DAILY   cyclobenzaprine (FLEXERIL) 5 MG tablet TAKE ONE TABLET THREE TIMES DAILY AS NEEDED FOR MUSCLE SPASMS (Patient taking differently: Take 5 mg by mouth 3 (three) times daily as needed for muscle spasms.)   ipratropium-albuterol (DUONEB) 0.5-2.5 (3) MG/3ML SOLN Take 3 mLs by nebulization every 6 (six) hours as needed (SOB and/or wheezing).   meloxicam (MOBIC) 15 MG tablet Take 15 mg by mouth daily.   nicotine (NICODERM CQ - DOSED IN MG/24 HOURS) 21 mg/24hr patch Place 1 patch (21 mg total) onto the skin daily.   pantoprazole (PROTONIX) 40 MG tablet  Take 1 tablet (40 mg total) by mouth daily.   predniSONE (DELTASONE) 20 MG tablet Take 3 tablets by mouth daily x1 day; then 2 tablets by mouth daily x2 days; then 1 tablet by mouth daily x3-day; then half tablet by mouth daily x3 days and stop prednisone.   valsartan-hydrochlorothiazide (DIOVAN HCT) 160-12.5 MG tablet Take 1 tablet by mouth daily.   No facility-administered encounter medications on file as of 04/11/2022.     Review of Systems  Review of Systems  No chest pain with exertion.  No orthopnea or PND.  Comprehensive review of systems otherwise negative. Physical Exam  BP 128/68 (BP Location: Left Arm, Patient Position: Sitting, Cuff Size: Normal)   Pulse 73   Ht 5\' 1"  (1.549 m)   Wt 122 lb 9.6 oz (55.6 kg)   SpO2 98%   BMI 23.17 kg/m   Wt Readings from Last 5 Encounters:  04/11/22 122 lb 9.6 oz (55.6 kg)  03/27/22 123 lb 10.9 oz (56.1 kg)  03/19/22 121 lb 12.8 oz (55.2 kg)  03/12/22 119 lb 0.8 oz (54 kg)  12/27/21 119 lb 12.8 oz (54.3 kg)    BMI Readings from Last 5 Encounters:  04/11/22 23.17 kg/m  03/27/22 23.37 kg/m  03/19/22 23.01 kg/m  03/12/22 22.49 kg/m  12/27/21 22.64 kg/m     Physical Exam General: Sitting in chair, no acute distress Eyes: EOMI, icterus Neck: Supple,  JVP Pulmonary: Clear, distant, normal work of breathing Cardiovascular: Warm, no edema Abdomen: Thin, sounds present MSK: No synovitis, no joint effusion Neuro: Normal gait, no weakness Psych: Normal mood, full affect   Assessment & Plan:   Dyspnea on exertion: High suspicion for smoking-related disease.  I see no PFTs to fully diagnose COPD.  Asthma possible, notably eosinophils elevated.  Chest images are hyperinflated and emphysema, thickened bronchioles, high suspicion for obstructive disease.  Improved with arformoterol and budesonide nebulizer.  To continue.  Use albuterol and DuoNebs as needed.  PFTs for further evaluation.  Tobacco abuse in remission: She was congratulated on quitting smoking.  She expressed concerns with gaining weight when she has quit smoking in the past.  I have encouraged her to stick with full abstinence from cigarette smoking and weight gain but, probably discussed with her PCP for evaluation.   Return in about 3 months (around 07/12/2022).   Lanier Clam, MD 04/11/2022   This appointment required 61 minutes of patient care (this includes precharting, chart review, review of results, face-to-face care, etc.).

## 2022-04-14 IMAGING — MG MM DIGITAL SCREENING BILAT W/ TOMO AND CAD
8 series · 9 of 24 positions shown · non-contrast
Comparison: Previous exam(s).

CLINICAL DATA: Screening.

EXAM:
DIGITAL SCREENING BILATERAL MAMMOGRAM WITH TOMOSYNTHESIS AND CAD
TECHNIQUE: Bilateral screening digital craniocaudal and mediolateral oblique
mammograms were obtained. Bilateral screening digital breast
tomosynthesis was performed. The images were evaluated with
computer-aided detection.

[L CC synth-2D]
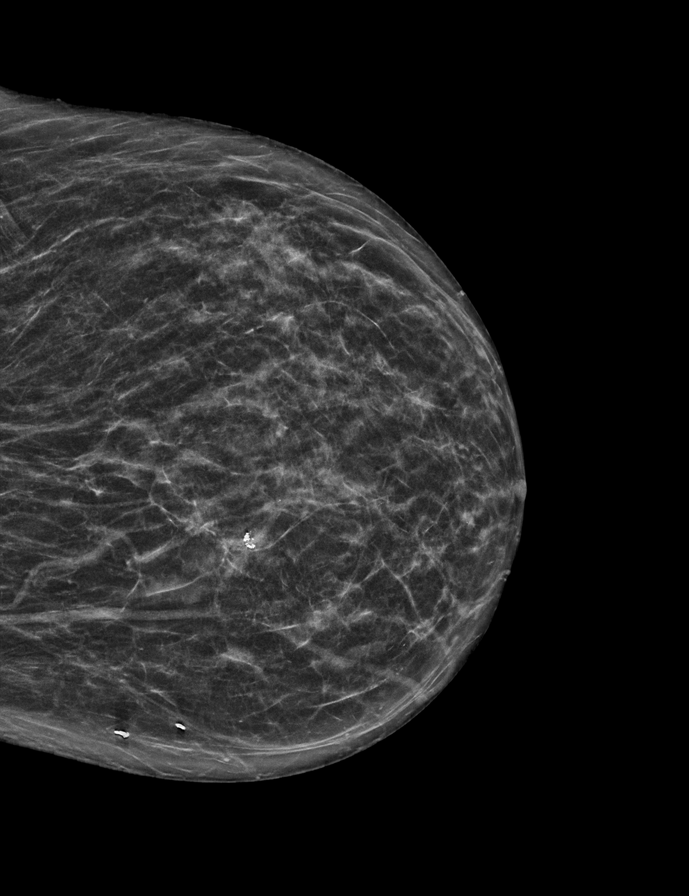

[R MLO synth-2D]
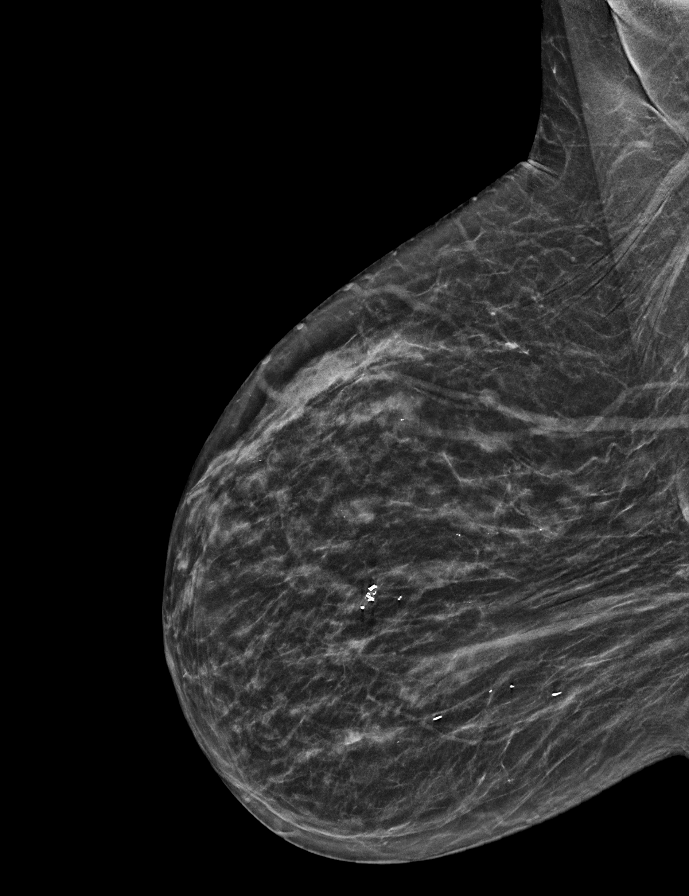

[L MLO synth-2D]
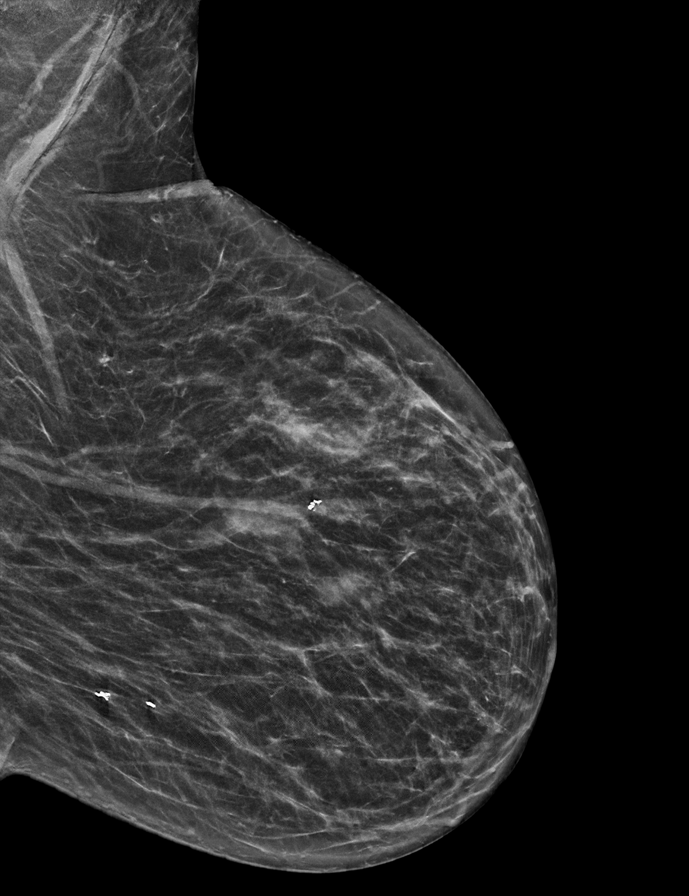

[R CC synth-2D]
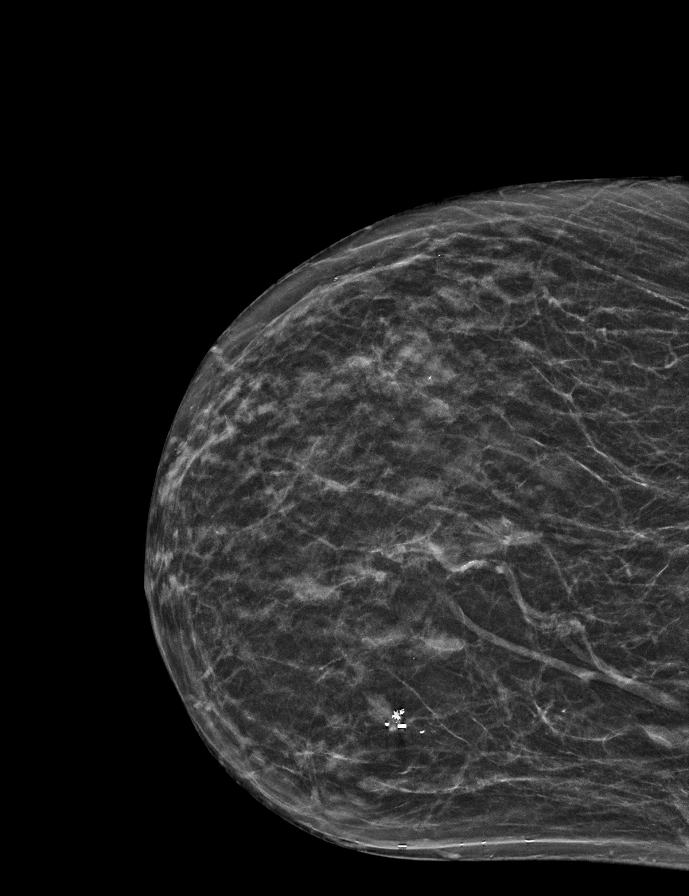

[L MLO tomo · 2 of 52 frames shown]
[frame 17/52]
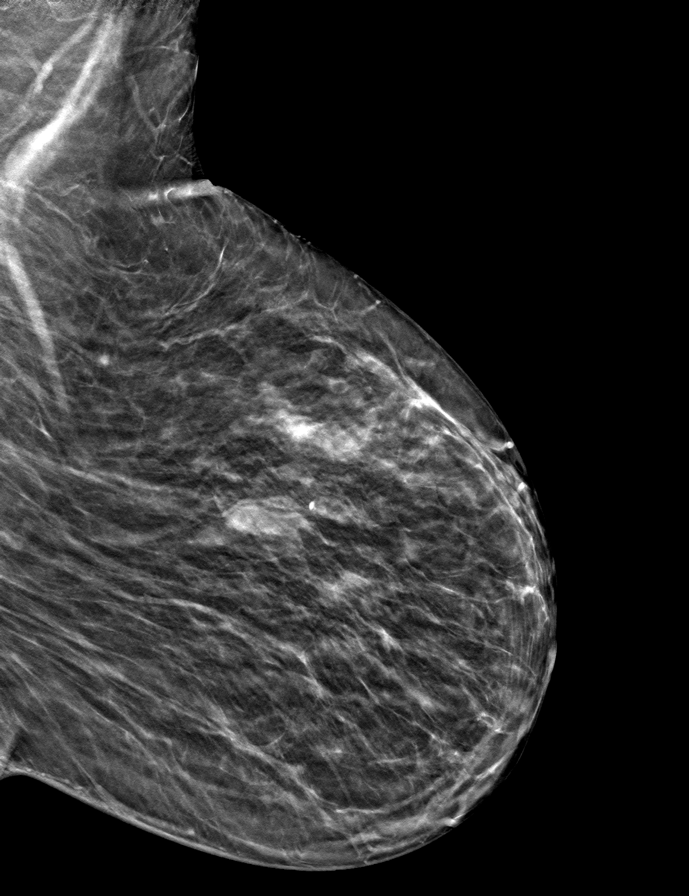
[frame 27/52]
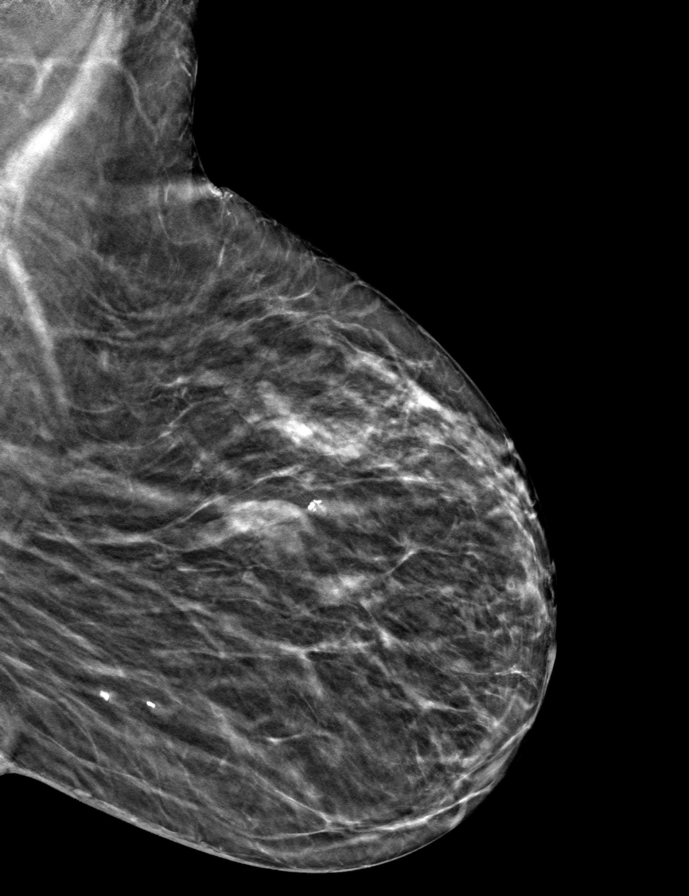

[R CC tomo · tomo slice 21/40.0]
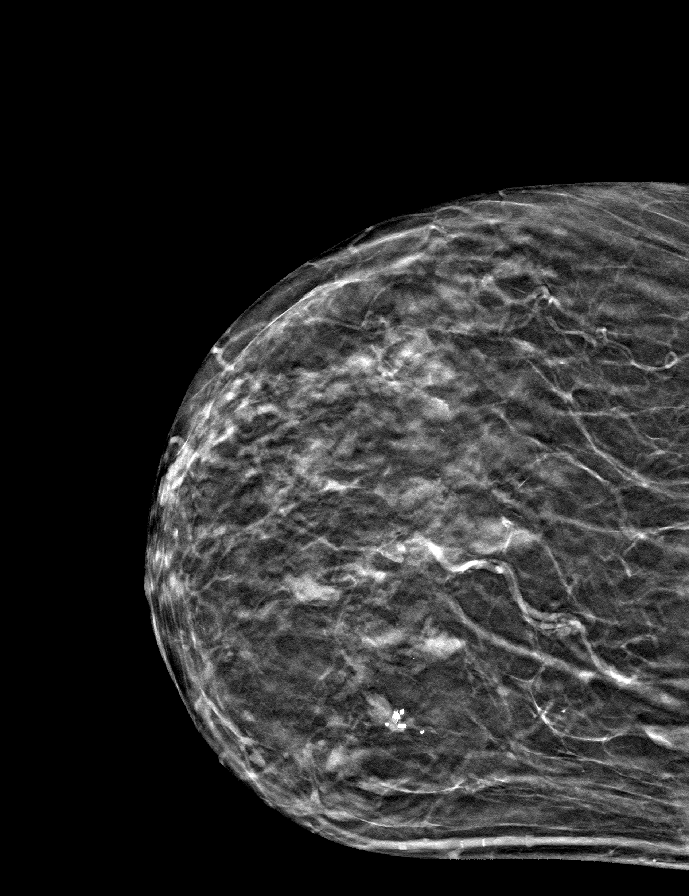

[R MLO tomo · tomo slice 23/44.0]
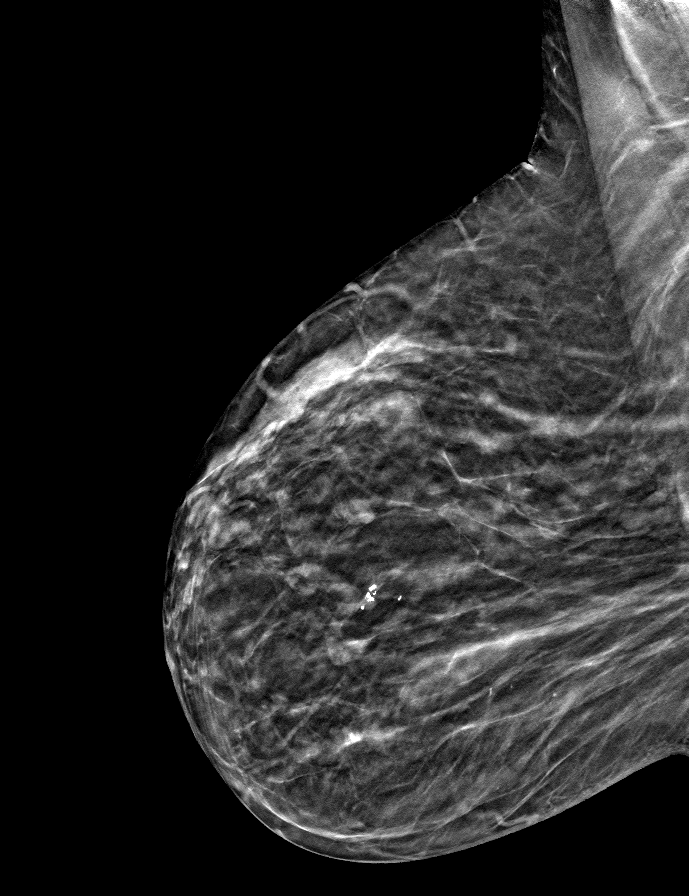

[L CC tomo · tomo slice 24/47.0]
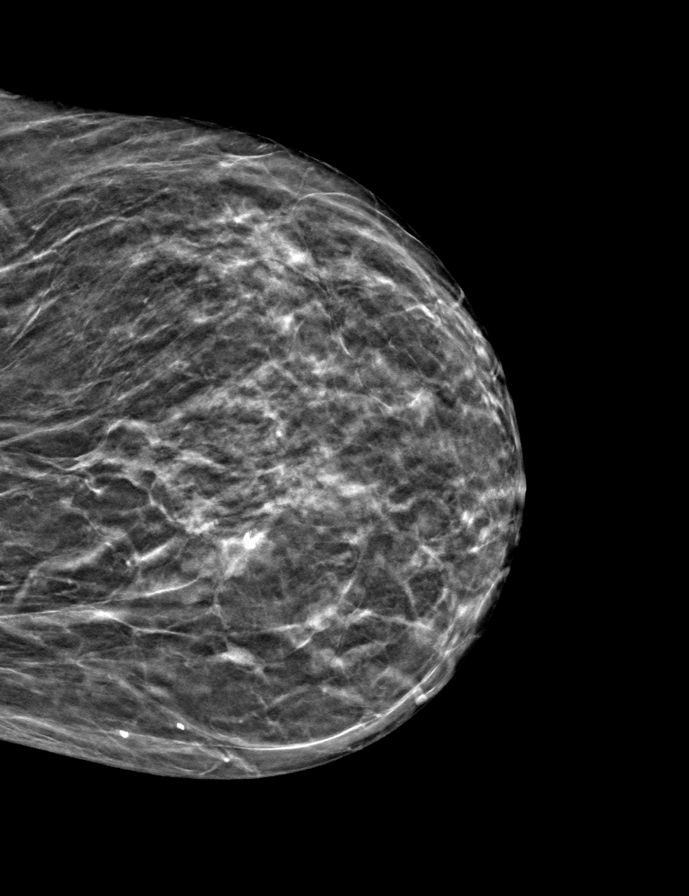

[9 of 24 positions shown; findings below may reference images not displayed]

ACR Breast Density Category b: There are scattered areas of
fibroglandular density.
FINDINGS: There are no findings suspicious for malignancy.
IMPRESSION: No mammographic evidence of malignancy. A result letter of this
screening mammogram will be mailed directly to the patient.

RECOMMENDATION:
Screening mammogram in one year. (Code:51-O-LD2)

BI-RADS CATEGORY  1: Negative.

## 2022-04-17 ENCOUNTER — Ambulatory Visit (INDEPENDENT_AMBULATORY_CARE_PROVIDER_SITE_OTHER): Payer: Medicare Other | Admitting: Family

## 2022-04-17 ENCOUNTER — Encounter (HOSPITAL_BASED_OUTPATIENT_CLINIC_OR_DEPARTMENT_OTHER): Payer: Self-pay | Admitting: Family

## 2022-04-17 VITALS — BP 162/86 | HR 73 | Ht 61.0 in | Wt 126.0 lb

## 2022-04-17 DIAGNOSIS — I25118 Atherosclerotic heart disease of native coronary artery with other forms of angina pectoris: Secondary | ICD-10-CM | POA: Diagnosis not present

## 2022-04-17 DIAGNOSIS — J4489 Other specified chronic obstructive pulmonary disease: Secondary | ICD-10-CM | POA: Diagnosis not present

## 2022-04-17 DIAGNOSIS — I739 Peripheral vascular disease, unspecified: Secondary | ICD-10-CM

## 2022-04-17 DIAGNOSIS — Z720 Tobacco use: Secondary | ICD-10-CM

## 2022-04-17 DIAGNOSIS — I10 Essential (primary) hypertension: Secondary | ICD-10-CM

## 2022-04-17 DIAGNOSIS — E785 Hyperlipidemia, unspecified: Secondary | ICD-10-CM

## 2022-04-17 DIAGNOSIS — Z8673 Personal history of transient ischemic attack (TIA), and cerebral infarction without residual deficits: Secondary | ICD-10-CM

## 2022-04-17 LAB — LIPID PANEL
Chol/HDL Ratio: 2 ratio (ref 0.0–4.4)
Cholesterol, Total: 179 mg/dL (ref 100–199)
HDL: 88 mg/dL (ref >39)
LDL Chol Calc (NIH): 81 mg/dL (ref 0–99)
Triglycerides: 52 mg/dL (ref 0–149)
VLDL Cholesterol Cal: 10 mg/dL (ref 5–40)

## 2022-04-17 LAB — COMPREHENSIVE METABOLIC PANEL
ALT: 9 IU/L (ref 0–32)
AST: 17 IU/L (ref 0–40)
Albumin/Globulin Ratio: 1.9 (ref 1.2–2.2)
Albumin: 4.2 g/dL (ref 3.8–4.8)
Alkaline Phosphatase: 51 IU/L (ref 44–121)
BUN/Creatinine Ratio: 14 (ref 12–28)
BUN: 16 mg/dL (ref 8–27)
Bilirubin Total: 0.4 mg/dL (ref 0.0–1.2)
CO2: 23 mmol/L (ref 20–29)
Calcium: 8.9 mg/dL (ref 8.7–10.3)
Chloride: 107 mmol/L — ABNORMAL HIGH (ref 96–106)
Creatinine, Ser: 1.12 mg/dL — ABNORMAL HIGH (ref 0.57–1.00)
Globulin, Total: 2.2 g/dL (ref 1.5–4.5)
Glucose: 93 mg/dL (ref 70–99)
Potassium: 4 mmol/L (ref 3.5–5.2)
Sodium: 146 mmol/L — ABNORMAL HIGH (ref 134–144)
Total Protein: 6.4 g/dL (ref 6.0–8.5)
eGFR: 50 mL/min/{1.73_m2} — ABNORMAL LOW (ref >59)

## 2022-04-17 MED ORDER — HYDRALAZINE HCL 25 MG PO TABS: 25.0000 mg | ORAL_TABLET | Freq: Three times a day (TID) | ORAL | 2 refills | Status: DC

## 2022-04-17 NOTE — Progress Notes (Signed)
Office Visit    Patient Name: [name] Date of Encounter: 04/17/2022  PCP:  Claretta Fraise, [place]  Cardiologist:  Skeet Latch, MD  Advanced Practice Provider:  No care team member to display Electrophysiologist:  None   Chief Complaint    [name] is a 81 y.o. 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   Stroke ([place])    SLIGHT RT SIDE WEAKNESS 2001   Unintentional weight loss 03/19/2022   Past Surgical History:  Procedure Laterality Date   ABDOMINAL HYSTERECTOMY     ANTERIOR CERVICAL CORPECTOMY  03/07/2012   Procedure: ANTERIOR CERVICAL CORPECTOMY;  Surgeon: Kristeen Miss, MD;  Location: [facility];  Service: Neurosurgery;  Laterality: N/A;  Cervical six-seven, cervical seven-thoracic one Anterior cervical decompression/diskectomy/fusion, with Cervical seven Corpectomy, reconstruction using Allograft and Alphatec plate   CATARACT EXTRACTION W/PHACO Left 04/12/2017   Procedure: CATARACT EXTRACTION PHACO AND INTRAOCULAR LENS PLACEMENT ([place]);  Surgeon: Baruch Goldmann, MD;  Location: [facility];  Service: Ophthalmology;  Laterality: Left;  CDE: 3.82   CATARACT EXTRACTION W/PHACO Right 05/03/2017   Procedure: CATARACT EXTRACTION PHACO AND INTRAOCULAR LENS PLACEMENT RIGHT EYE;  Surgeon: Baruch Goldmann, MD;  Location: [facility];  Service: Ophthalmology;  Laterality: Right;  CDE: 3.89   MULTIPLE TOOTH EXTRACTIONS     TONSILLECTOMY      Allergies  Allergies  Allergen Reactions   Penicillins Anaphylaxis, Swelling, Rash and Other (See Comments)    Has  patient had a PCN reaction causing immediate rash, facial/tongue/throat swelling, SOB or lightheadedness with hypotension: yes Has patient had a PCN reaction causing severe rash involving mucus membranes or skin necrosis: no Has patient had a PCN reaction that required hospitalization: yes Has patient had a PCN reaction occurring within the last 10 years: no If all of the above answers are "NO", then may proceed with Cephalosporin use.     History of Present Illness    [name] is a 81 y.o. female with a hx of CAD, hyperlipidemia, penetrating aortic ulcer, CVA, COPD, hypertension, CKD 3A, GERD last seen 03/19/2022 by Dr. Oval Linsey.  Established with Dr. Oval Linsey of 03/19/2022 due to atypical chest pain.  Cardiac CTA ordered and performed 03/29/2022 calcium score 396 placing her in the 85th percentile for age/sex/race matched control.  Moderate nonobstructive disease with no significant lesion by FFR.  Admitted 10/29 - 03/27/2022 after presenting with COPD exacerbation as well as AKI.  Her ARB and HCTZ were held.  She was treated with antibiotics, bronchodilators/nebulizer.  Referred to pulmonology.  Established with Dr. Silas Flood 04/11/2022 and recommended for PFTs which are upcoming.  She presents today for follow-up and dependently.  Notes after hospitalization she had episode of depression and stayed in the house.  This has subsequently improved with no suicidal ideation.  She endorsed pending time with her 2 dogs and 2 cats at home.  She has blood pressure cuff at home but has not been checking routinely.  Reports exertional dyspnea which is overall improving.  She has been walking for exercise.  Denies chest pain, edema, orthopnea, PND.  EKGs/Labs/Other Studies Reviewed:   The following studies were reviewed today:  CTA 03/30/22 FINDINGS: Image quality: Fair due to significant slab artifact.   Noise artifact is: Limited.   Coronary Arteries:  Normal coronary origin.  Right  dominance.   Left main: The left main is a large caliber vessel with a normal take off from the left coronary cusp that bifurcates to form a left anterior descending artery and a left circumflex artery. There is no plaque or stenosis.   Left anterior descending artery: The LAD gives off 1 patent diagonal branch. There is mild calcified plaque in the proximal, mid and distal LAD with associated stenosis of 25-49%. There is moderate mixed plaque in the mid D1 with associated stenosis of 50-69%.   Left circumflex artery: The LCX is non-dominant and gives rise to 3 OM branches. There is mild calcified plaque in the proximal LCx with associated stenosis of 25-49%.   Right coronary artery: The RCA is dominant with normal take off from the right coronary cusp. The vessel is diffusely diseased in the proximal and mid portion with mixed and calcified plaque. There is mild to moderate calcified plaque in the proximal RCA with associated stenosis of 25-49% but could be >50%. There is mild calcified plaque in the distal RCA with associated stenosis of 25-49%. the RCA terminates as a PDA and right posterolateral branch without evidence of plaque or stenosis.   Right Atrium: Right atrial size is within normal limits.   Right Ventricle: The right ventricular cavity is within normal limits.   Left Atrium: Left atrial size is normal in size with no left atrial appendage filling defect.   Left Ventricle: The ventricular cavity size is within normal limits. There are no stigmata of prior infarction. There is no abnormal filling defect.   Pulmonary arteries: Normal in size without proximal filling defect.   Pulmonary veins: Normal pulmonary venous drainage.   Pericardium: Normal thickness with no significant effusion or calcium present.   Cardiac valves: The aortic valve is trileaflet without significant calcification. The mitral valve is normal structure without significant  calcification.   Aorta: Normal caliber with scattered calcifications.   Extra-cardiac findings: See attached radiology report for non-cardiac structures.   IMPRESSION: 1. Coronary calcium score of 396. This was 85th percentile for age-, sex, and race-matched controls.   2.  Normal coronary origin with right dominance.   3.  Moderate atherosclerosis.  CAD RADS 3.   4. Consider symptom-guided anti-ischemic pharmacotherapy and risk factor modification per guideline-directed medical therapy.   5.  Study has been submitted for FFR analysis.  1. Left Main: No significant stenosis.  LM FFR = 1.00.   2. LAD: No significant stenosis. Proximal FFR = 0.99, Mid FFR = 0.94, Distal FFR = 0.86.   3. LCX: No significant stenosis. Proximal FFR = 1.00, Mid FFR = 0.97, Distal FFR = 0.96.   4. RCA: No significant stenosis. Proximal FFR = 0.99, Mid FFR = 0.89, Distal FFR = 0.87.   IMPRESSION: 1. Coronary CTA FFR analysis demonstrates no significant flow limiting lesions.    EKG:  EKG is not ordered today.   Recent Labs: 03/25/2022: ALT 10; Magnesium 2.3 03/26/2022: Hemoglobin 14.8; Platelets 152 03/27/2022: BUN 33; Creatinine, Ser 1.27; Potassium 4.7; Sodium 139  Recent Lipid Panel    Component Value Date/Time   CHOL 167 02/21/2021 0817   TRIG 58 02/21/2021 0817  HDL 79 02/21/2021 0817   CHOLHDL 2.1 02/21/2021 0817   CHOLHDL 2.6 01/24/2020 0706   VLDL 6 01/24/2020 0706   LDLCALC 76 02/21/2021 0817   Home Medications   Current Meds  Medication Sig   amLODipine (NORVASC) 10 MG tablet Take 1 tablet (10 mg total) by mouth daily.   arformoterol (BROVANA) 15 MCG/2ML NEBU Take 2 mLs (15 mcg total) by nebulization 2 (two) times daily.   atorvastatin (LIPITOR) 40 MG tablet Take 1 tablet (40 mg total) by mouth daily.   budesonide (PULMICORT) 0.5 MG/2ML nebulizer solution Take 2 mLs (0.5 mg total) by nebulization 2 (two) times daily.   clopidogrel (PLAVIX) 75 MG tablet TAKE ONE TABLET  ONCE DAILY   cyclobenzaprine (FLEXERIL) 5 MG tablet TAKE ONE TABLET THREE TIMES DAILY AS NEEDED FOR MUSCLE SPASMS (Patient taking differently: Take 5 mg by mouth 3 (three) times daily as needed for muscle spasms.)   hydrALAZINE (APRESOLINE) 25 MG tablet Take 1 tablet (25 mg total) by mouth 3 (three) times daily.   meloxicam (MOBIC) 15 MG tablet Take 15 mg by mouth daily.   pantoprazole (PROTONIX) 40 MG tablet Take 1 tablet (40 mg total) by mouth daily.   valsartan-hydrochlorothiazide (DIOVAN HCT) 160-12.5 MG tablet Take 1 tablet by mouth daily.   [DISCONTINUED] aspirin EC 81 MG tablet Take 81 mg by mouth daily. Swallow whole.   [DISCONTINUED] nicotine (NICODERM CQ - DOSED IN MG/24 HOURS) 21 mg/24hr patch Place 1 patch (21 mg total) onto the skin daily.     Review of Systems      All other systems reviewed and are otherwise negative except as noted above.  Physical Exam    VS:  BP (!) 162/86   Pulse 73   Ht 5\' 1"  (1.549 m)   Wt 126 lb (57.2 kg)   BMI 23.81 kg/m  , BMI Body mass index is 23.81 kg/m.  Wt Readings from Last 3 Encounters:  04/17/22 126 lb (57.2 kg)  04/11/22 122 lb 9.6 oz (55.6 kg)  03/27/22 123 lb 10.9 oz (56.1 kg)     GEN: Well nourished, well developed, in no acute distress. HEENT: normal. Neck: Supple, no JVD, carotid bruits, or masses. Cardiac: RRR, no murmurs, rubs, or gallops. No clubbing, cyanosis, edema.  Radials/PT 2+ and equal bilaterally.  Respiratory:  Respirations regular and unlabored, clear to auscultation bilaterally. GI: Soft, nontender, nondistended. MS: No deformity or atrophy. Skin: Warm and dry, no rash. Neuro:  Strength and sensation are intact. Psych: Normal affect.  Assessment & Plan    CAD/hyperlipidemia-cardiac CTA 03/29/2022 with coronary calcium score of 396 placing in the 85th percentile with moderate nonobstructive coronary artery disease by FFR.  Denies anginal symptoms.  Continue Plavix, atorvastatin 40 mg daily.  We will update  lipid panel to ensure LDL at goal of less than 70.  Will defer beta-blocker due to COPD. Heart healthy diet and regular cardiovascular exercise encouraged.    HTN -BP not at goal.  Amlodipine 10 mg daily, valsartan-HCTZ 160-12.5 mg daily.  Given recent admission with AKI will defer adjustment of valsartan.  Start hydralazine 25 mg twice daily. Discussed to monitor BP at home at least 2 hours after medications and sitting for 5-10 minutes.   PAD - Penetrating aortic ulcer per previous report. Aorta by CTA normal calciber with scattered calcification.   Hx of CVA - Continue plavix, atorvastatin.   CKD- Careful titration of diuretic and antihypertensive.  Update BMP.  GERD- Continue to follow with  PCP.   COPD/tobacco use-following with pulmonology with PFTs upcoming.  Congratulated on quitting smoking.  No signs of acute exacerbation.  HYPERTENSION CONTROL Vitals:   04/17/22 0819 04/17/22 0919  BP: (!) 160/88 (!) 162/86    The patient's blood pressure is elevated above target today.  In order to address the patient's elevated BP: A referral to [facility] will be placed.; A current anti-hypertensive medication was adjusted today.       Disposition: Follow up with pharmacy team as scheduled.  Signed, Loel Dubonnet, NP 04/17/2022, 10:00 AM [place]

## 2022-04-17 NOTE — Patient Instructions (Addendum)
Medication Instructions:  Your physician has recommended you make the following change in your medication:   START Hydralazine 25mg  twice daily  *If you need a refill on your cardiac medications before your next appointment, please call your pharmacy*   Lab Work: Your physician recommends that you return for lab work today: lipid, CMP  If you have labs (blood work) drawn today and your tests are completely normal, you will receive your results only by: MyChart Message (if you have MyChart) OR A paper copy in the mail If you have any lab test that is abnormal or we need to change your treatment, we will call you to review the results.   Testing/Procedures: None ordered today   Follow-Up: At [facility] you and your health needs are our priority.  As part of our continuing mission to provide you with exceptional heart care, we have created designated Provider Care Teams.  These Care Teams include your primary Cardiologist (physician) and Advanced Practice Providers (APPs -  Physician Assistants and Nurse Practitioners) who all work together to provide you with the care you need, when you need it.  We recommend signing up for the patient portal called "MyChart".  Sign up information is provided on this After Visit Summary.  MyChart is used to connect with patients for Virtual Visits (Telemedicine).  Patients are able to view lab/test results, encounter notes, upcoming appointments, etc.  Non-urgent messages can be sent to your provider as well.   To learn more about what you can do with MyChart, go to [facility].    Your next appointment:   As scheduled with pharmacy team  Other Instructions  Heart Healthy Diet Recommendations: A low-salt diet is recommended. Meats should be grilled, baked, or boiled. Avoid fried foods. Focus on lean protein sources like fish or chicken with vegetables and fruits. The American Heart Association is a [employer]!  American  Heart Association Diet and Lifeystyle Recommendations   Exercise recommendations: The American Heart Association recommends 150 minutes of moderate intensity exercise weekly. Try 30 minutes of moderate intensity exercise 4-5 times per week. This could include walking, jogging, or swimming.

## 2022-04-18 ENCOUNTER — Telehealth (HOSPITAL_BASED_OUTPATIENT_CLINIC_OR_DEPARTMENT_OTHER): Payer: Self-pay

## 2022-04-18 DIAGNOSIS — E782 Mixed hyperlipidemia: Secondary | ICD-10-CM

## 2022-04-18 MED ORDER — ATORVASTATIN CALCIUM 80 MG PO TABS: 80.0000 mg | ORAL_TABLET | Freq: Every day | ORAL | 3 refills | Status: DC

## 2022-04-18 NOTE — Telephone Encounter (Addendum)
Results called to patient who verbalizes understanding! Labs ordered and mailed, prescriptions updated and sent to pharmacy on file.    ----- Message from Alver Sorrow, NP sent at 04/18/2022  9:29 AM EST ----- Kidney function improving. Sodium mildly elevated, not of concern. Normal liver enzymes. Cholesterol shows LDL not at goal of less than 70. Recommend increase Atorvastatin to 80mg  daily with FLP/LFT in 2 months.

## 2022-04-23 ENCOUNTER — Other Ambulatory Visit: Payer: Self-pay | Admitting: Nurse Practitioner

## 2022-04-23 ENCOUNTER — Other Ambulatory Visit: Payer: Self-pay | Admitting: Family Medicine

## 2022-04-23 DIAGNOSIS — M5430 Sciatica, unspecified side: Secondary | ICD-10-CM

## 2022-04-24 ENCOUNTER — Other Ambulatory Visit: Payer: Self-pay | Admitting: *Deleted

## 2022-04-24 MED ORDER — MELOXICAM 15 MG PO TABS: 15.0000 mg | ORAL_TABLET | Freq: Every day | ORAL | 1 refills | Status: DC

## 2022-04-27 DIAGNOSIS — J441 Chronic obstructive pulmonary disease with (acute) exacerbation: Secondary | ICD-10-CM | POA: Diagnosis not present

## 2022-04-30 DIAGNOSIS — J449 Chronic obstructive pulmonary disease, unspecified: Secondary | ICD-10-CM | POA: Diagnosis not present

## 2022-05-10 ENCOUNTER — Ambulatory Visit (INDEPENDENT_AMBULATORY_CARE_PROVIDER_SITE_OTHER): Payer: Medicare Other | Admitting: Pulmonary Disease

## 2022-05-10 DIAGNOSIS — R0609 Other forms of dyspnea: Secondary | ICD-10-CM

## 2022-05-10 LAB — PULMONARY FUNCTION TEST
DL/VA % pred: 74 %
DL/VA: 3.11 ml/min/mmHg/L
DLCO cor % pred: 68 %
DLCO cor: 11.57 ml/min/mmHg
DLCO unc % pred: 68 %
DLCO unc: 11.57 ml/min/mmHg
FEF 25-75 Post: 0.38 L/sec
FEF 25-75 Pre: 0.42 L/sec
FEF2575-%Change-Post: -9 %
FEF2575-%Pred-Post: 29 %
FEF2575-%Pred-Pre: 32 %
FEV1-%Change-Post: -4 %
FEV1-%Pred-Post: 55 %
FEV1-%Pred-Pre: 58 %
FEV1-Post: 0.95 L
FEV1-Pre: 0.99 L
FEV1FVC-%Change-Post: 0 %
FEV1FVC-%Pred-Pre: 74 %
FEV6-%Change-Post: -5 %
FEV6-%Pred-Post: 78 %
FEV6-%Pred-Pre: 82 %
FEV6-Post: 1.69 L
FEV6-Pre: 1.79 L
FEV6FVC-%Change-Post: -1 %
FEV6FVC-%Pred-Post: 104 %
FEV6FVC-%Pred-Pre: 106 %
FVC-%Change-Post: -3 %
FVC-%Pred-Post: 74 %
FVC-%Pred-Pre: 77 %
FVC-Post: 1.72 L
FVC-Pre: 1.79 L
Post FEV1/FVC ratio: 55 %
Post FEV6/FVC ratio: 99 %
Pre FEV1/FVC ratio: 55 %
Pre FEV6/FVC Ratio: 100 %
RV % pred: 164 %
RV: 3.65 L
TLC % pred: 129 %
TLC: 5.96 L

## 2022-05-10 NOTE — Progress Notes (Signed)
Full PFT performed today. °

## 2022-05-10 NOTE — Patient Instructions (Signed)
Full PFT performed today. °

## 2022-05-16 ENCOUNTER — Ambulatory Visit (INDEPENDENT_AMBULATORY_CARE_PROVIDER_SITE_OTHER): Payer: Medicare Other | Admitting: Family Medicine

## 2022-05-16 ENCOUNTER — Encounter: Payer: Self-pay | Admitting: Family Medicine

## 2022-05-16 VITALS — BP 130/73 | HR 83 | Temp 98.4°F | Ht 61.0 in | Wt 128.0 lb

## 2022-05-16 DIAGNOSIS — G453 Amaurosis fugax: Secondary | ICD-10-CM

## 2022-05-16 DIAGNOSIS — R42 Dizziness and giddiness: Secondary | ICD-10-CM

## 2022-05-16 NOTE — Patient Instructions (Signed)
Amaurosis Fugax Amaurosis fugax, also called transient visual loss, is a condition in which a person loses sight in one eye or, rarely, both eyes for a short time. The vision loss in the affected eye may be total or partial. The vision loss usually lasts for only a few seconds or minutes before sight returns to normal. In some cases, vision loss may last for several hours. This condition is typically caused by interruption of blood flow in the artery that supplies blood to the retina. The retina is the part of the eye that contains the nerves needed for sight. This condition can be a warning sign that a stroke may happen, either in the eye or in the brain. A stroke can result in permanent vision loss or loss of other body functions. What are the causes? This condition is caused by a loss or interruption of blood flow to the retinal artery. Causes for the change in blood flow include: A buildup of cholesterol and fats, or plaque, in the artery (atherosclerosis). If any plaque breaks off and gets into the bloodstream, it can travel to other blood vessels, such as the retinal artery. Diseases of the heart valves. Certain blood conditions, such as sickle cell anemia, leukemia, and blood clotting (coagulation) disorders. Inflammation of the arteries (vasculitis). A fast or irregular heartbeat, such as atrial fibrillation. Family history of stroke. What increases the risk? The following factors may make you more likely to develop this condition: Use of any tobacco products, including cigarettes, chewing tobacco, or electronic cigarettes. Poorly controlled diabetes. Conditions that can lead to diseases of the heart and blood vessels (cardiovascular diseases), such as: High blood pressure (hypertension). High cholesterol. Drinking too much alcohol regularly. Use of drugs, especially cocaine. Age. The risk increases with age. What are the signs or symptoms? The main symptom of this condition is  painless, sudden loss of vision in one eye. The vision loss often starts at the top and moves down, as if a curtain is being pulled down over your eye. This is usually followed by a quick return of vision. However, symptoms may last for several hours. It is important to seek medical care right away even if your symptoms go away. How is this diagnosed? This condition is diagnosed by: Medical history and physical exam. Eye exam, including dilating drops and looking at the back of your eyes. Carotid ultrasound. This checks for plaque in the carotid arteries in your neck. Magnetic resonance angiography (MRA). This checks the carotid artery and the branches that supply the brain. MRA looks for areas of blockage or disease. You may also have other tests, including: Blood tests. Electrocardiogram (ECG) to check your heart rhythm. Echocardiogram (ECHO) to check your heart function. How is this treated? Emergency treatment for this condition may involve massaging the eyeball or using certain breathing techniques to remove or ease the blockage of the retinal artery. You may also get an emergency referral to a clinic or medical center that treats strokes. Other treatments focus on reducing your risk of having a stroke in the future. These may include: Medicines, such as medicines to manage high blood pressure, diabetes, or cholesterol, or to thin the blood. Surgical procedures, such as: Carotid endarterectomy to remove plaque from the carotid artery. Carotid angioplasty and placement of a small, mesh tube (stenting) to open the blocked part of the artery. Lifestyle changes, such as stopping tobacco use, changing your diet, and getting enough exercise. Follow these instructions at home: Medicines Take over-the-counter and   prescription medicines only as told by your health care provider. If you are taking blood thinners: Talk with your health care provider before you take any medicines that contain aspirin  or NSAIDs, such as ibuprofen. These medicines increase your risk for dangerous bleeding. Take your medicine exactly as told, at the same time every day. Avoid activities that could cause injury or bruising, and follow instructions about how to prevent falls. Wear a medical alert bracelet or carry a card that lists what medicines you take. Eating and drinking  Eat a diet that includes five or more servings of fruits and vegetables each day. This may reduce the risk of stroke. Certain diets may help with high blood pressure, high cholesterol, diabetes, or obesity. These include: A diet that is low in salt (sodium) to manage high blood pressure. A high-fiber diet that is low in saturated fat, trans fat, and cholesterol to control cholesterol levels. A low-carbohydrate, low-sugar diet to manage diabetes. A reduced-calorie diet that is low in sodium, saturated fat, trans fat, and cholesterol to manage obesity. Lifestyle  Maintain a healthy weight. Stay physically active. Try to get at least 30 minutes of activity on most or all days. Do not use any products that contain nicotine or tobacco, such as cigarettes, e-cigarettes, and chewing tobacco. If you need help quitting, ask your health care provider. Do not misuse drugs. General instructions Do not drink alcohol if: Your health care provider tells you not to drink. You are pregnant, may be pregnant, or are planning to become pregnant. If you drink alcohol: Limit how much you use to: 0-1 drink a day for women. 0-2 drinks a day for men. Be aware of how much alcohol is in your drink. In the [place] one drink equals one 12 oz bottle of beer (355 mL), one 5 oz glass of wine (148 mL), or one 1 oz glass of hard liquor (44 mL). Keep all follow-up visits as told by your health care provider. This is important. Contact a health care provider if: You lose vision in one eye or both eyes. Get help right away if:  You have chest pain or an irregular  heartbeat. You have any symptoms of a stroke. "BE FAST" is an easy way to remember the main warning signs of a stroke. B - Balance. Signs are dizziness, sudden trouble walking, or loss of balance. E - Eyes. Signs are trouble seeing or a sudden change in vision. F - Face. Signs are sudden weakness or numbness of the face, or the face or eyelid drooping on one side. A - Arms. Signs are weakness or numbness in an arm. This happens suddenly and usually on one side of the body. S - Speech. Signs are sudden trouble speaking, slurred speech, or trouble understanding what people say. T - Time. Time to call emergency services. Write down what time symptoms started. You have other signs of a stroke, such as: A sudden, severe headache with no known cause. Nausea or vomiting. Seizure. These symptoms may represent a serious problem that is an emergency. Do not wait to see if the symptoms will go away. Get medical help right away. Call your local emergency services (911 in the [place] Do not drive yourself to the hospital. Summary Amaurosis fugax is a condition in which you lose sight for a short time. This condition can be a warning sign that a stroke may happen. A stroke can result in permanent vision loss or loss of other body functions. Seek medical   care right away even if your symptoms go away. This information is not intended to replace advice given to you by your health care provider. Make sure you discuss any questions you have with your health care provider. Document Revised: 08/17/2021 Document Reviewed: 11/26/2018 Elsevier Patient Education  2023 [employer]  

## 2022-05-16 NOTE — Progress Notes (Signed)
Established Patient Office Visit  Subjective   Patient ID: [name], female    DOB: 1941-01-16  Age: 81 y.o. MRN: [id number]  Chief Complaint  Patient presents with   Eye Problem    Pt states she has been seeing orange spots for about 2 months now , this is happening about 3-4 times a week , states they last about 5-10 mins each time   Dizziness    HPI [name] reports visual disturbances for the last 2 months. She reports that her vision suddenly becomes orange in both eyes. This lasts for 5-10 minutes at a time. She feels very lightheaded after. She sits down and the lightheadedness resolves after a few minutes. This occurs 3-4x a week. She denies precipitating factors. She has felt off balance for the last month or so and reports numbness/tingling everywhere for the last month. She denies HA, changes in speech, weakness, changes in hearing, edema, chest pain, shortness of breath, of confusion. She has a history of TIAs and CVA. She is established with cardiology for CAD. She has an eye exam recently that was normal and the eye doctor recommended that she follow up with her PCP for further evaluation. 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 She is on a daily statin and plavix.   ROS As per HPI.    Objective:     BP 130/73   Pulse 83   Temp 98.4 F (36.9 C)   Ht _0  (1.549 m)   Wt 128 lb (58.1 kg)   SpO2 96%   BMI 24.19 kg/m    Physical Exam Vitals and nursing note reviewed.  Constitutional:      General: She is not in acute distress.    Appearance: She is not ill-appearing, toxic-appearing or diaphoretic.  HENT:     Right Ear: Tympanic membrane, ear canal and external ear normal.     Left Ear: Tympanic membrane, ear canal and external ear normal.     Nose: Nose normal.     Mouth/Throat:     Mouth: Mucous membranes are moist.     Pharynx: Oropharynx is clear.  Eyes:     General: No scleral icterus.       Right eye: No discharge.        Left  eye: No discharge.     Extraocular Movements: Extraocular movements intact.     Pupils: Pupils are equal, round, and reactive to light.  Neck:     Vascular: No carotid bruit.  Cardiovascular:     Rate and Rhythm: Regular rhythm.     Heart sounds: Normal heart sounds. No murmur heard. Pulmonary:     Effort: Pulmonary effort is normal. No respiratory distress.     Breath sounds: Normal breath sounds. No wheezing, rhonchi or rales.  Chest:     Chest wall: No tenderness.  Abdominal:     General: Bowel sounds are normal. There is no distension.     Palpations: Abdomen is soft.     Tenderness: There is no abdominal tenderness. There is no guarding or rebound.  Musculoskeletal:     Cervical back: No rigidity.  Lymphadenopathy:     Cervical: No cervical adenopathy.  Neurological:     General: No focal deficit present.     Mental Status: She is alert and oriented to person, place, and time.     Cranial Nerves: No cranial nerve deficit.     Sensory: No sensory deficit.  Motor: No weakness.     Coordination: Coordination normal.     Gait: Gait normal.  Psychiatric:        Mood and Affect: Mood normal.        Behavior: Behavior normal.        Thought Content: Thought content normal.        Judgment: Judgment normal.      No results found for any visits on 05/16/22.    The 10-year ASCVD risk score (Arnett DK, et al., 2019) is: 22.2%    Assessment & Plan:   [name] was seen today for eye problem and dizziness.  Diagnoses and all orders for this visit:  Amaurosis fugax, both eyes Dizziness Reviewed notes from eye doctor. Transient vision loss in both eye for 10-15 minutes followed by dizziness that is occurring 3-4x a week. History of CVA, CAD. On statin and plavix. Established with cardiology. Will check labs as below. MRI brain and bilateral carotid US ordered for possible etiology. Will notify patient of results and plan of care pending results. Return to office for new or  worsening symptoms. -     C-reactive protein -     Sedimentation Rate -     CBC with Differential/Platelet -     US Carotid Duplex Bilateral; Future -     MR Brain W Wo Contrast; Future -     BMP8+EGFR  The patient indicates understanding of these issues and agrees with the plan.   Gwenlyn Perking, FNP

## 2022-05-17 LAB — BMP8+EGFR
BUN/Creatinine Ratio: 14 (ref 12–28)
BUN: 18 mg/dL (ref 8–27)
CO2: 26 mmol/L (ref 20–29)
Calcium: 9.5 mg/dL (ref 8.7–10.3)
Chloride: 101 mmol/L (ref 96–106)
Creatinine, Ser: 1.25 mg/dL — ABNORMAL HIGH (ref 0.57–1.00)
Glucose: 99 mg/dL (ref 70–99)
Potassium: 4.2 mmol/L (ref 3.5–5.2)
Sodium: 141 mmol/L (ref 134–144)
eGFR: 44 mL/min/{1.73_m2} — ABNORMAL LOW (ref >59)

## 2022-05-17 LAB — CBC WITH DIFFERENTIAL/PLATELET
Basophils Absolute: 0 10*3/uL (ref 0.0–0.2)
Basos: 1 %
EOS (ABSOLUTE): 0.1 10*3/uL (ref 0.0–0.4)
Eos: 3 %
Hematocrit: 42.2 % (ref 34.0–46.6)
Hemoglobin: 14 g/dL (ref 11.1–15.9)
Immature Grans (Abs): 0 10*3/uL (ref 0.0–0.1)
Immature Granulocytes: 0 %
Lymphocytes Absolute: 2 10*3/uL (ref 0.7–3.1)
Lymphs: 36 %
MCH: 30.5 pg (ref 26.6–33.0)
MCHC: 33.2 g/dL (ref 31.5–35.7)
MCV: 92 fL (ref 79–97)
Monocytes Absolute: 0.6 10*3/uL (ref 0.1–0.9)
Monocytes: 10 %
Neutrophils Absolute: 2.8 10*3/uL (ref 1.4–7.0)
Neutrophils: 50 %
Platelets: 158 10*3/uL (ref 150–450)
RBC: 4.59 x10E6/uL (ref 3.77–5.28)
RDW: 12 % (ref 11.7–15.4)
WBC: 5.5 10*3/uL (ref 3.4–10.8)

## 2022-05-17 LAB — SEDIMENTATION RATE: Sed Rate: 10 mm/hr (ref 0–40)

## 2022-05-17 LAB — C-REACTIVE PROTEIN: CRP: <1 mg/L (ref 0–10)

## 2022-05-22 ENCOUNTER — Ambulatory Visit: Payer: Medicare Other

## 2022-05-25 ENCOUNTER — Ambulatory Visit (HOSPITAL_COMMUNITY)
Admission: RE | Admit: 2022-05-25 | Discharge: 2022-05-25 | Disposition: A | Discharge status: Home / Self Care | Payer: Medicare Other | Source: Ambulatory Visit | Attending: Family Medicine | Admitting: Family Medicine

## 2022-05-25 DIAGNOSIS — R42 Dizziness and giddiness: Secondary | ICD-10-CM | POA: Insufficient documentation

## 2022-05-25 DIAGNOSIS — G453 Amaurosis fugax: Secondary | ICD-10-CM | POA: Diagnosis not present

## 2022-05-25 DIAGNOSIS — H547 Unspecified visual loss: Secondary | ICD-10-CM | POA: Diagnosis not present

## 2022-05-25 DIAGNOSIS — R55 Syncope and collapse: Secondary | ICD-10-CM | POA: Diagnosis not present

## 2022-05-25 MED ORDER — GADOBUTROL 1 MMOL/ML IV SOLN
6.0000 mL | Freq: Once | INTRAVENOUS | Status: AC | PRN
  Administered 2022-05-25: 6 mL via INTRAVENOUS

## 2022-05-31 DIAGNOSIS — J449 Chronic obstructive pulmonary disease, unspecified: Secondary | ICD-10-CM | POA: Diagnosis not present

## 2022-06-06 ENCOUNTER — Ambulatory Visit: Payer: Medicare Other | Admitting: Family Medicine

## 2022-06-11 NOTE — Progress Notes (Unsigned)
Office Visit    Patient Name: [name] Date of Encounter: 06/12/2022  Primary Care Provider:  Claretta Fraise, MD Primary Cardiologist:  Skeet Latch, MD  Chief Complaint    Hypertension - Advanced hypertension clinic  Past Medical History   HTN amlodipine 10 mg daily, valsartan/hctz 160/12.5 mg daily, hydralazine 25 mg BID  HLD atorvastatin 80 mg daily  Penetrating aortic ulcer   COPD Brovana nebulizer, pulmicort, albuterol, symbicort  CKD   Stroke ASA, plavix 75 mg daily  Other meloxicam 15 mg daily, cyclobenzaprine 5 mg TID prn, pantoprazole 40 mg daily    Allergies  Allergen Reactions   Penicillins Anaphylaxis, Swelling, Rash and Other (See Comments)    Has patient had a PCN reaction causing immediate rash, facial/tongue/throat swelling, SOB or lightheadedness with hypotension: yes Has patient had a PCN reaction causing severe rash involving mucus membranes or skin necrosis: no Has patient had a PCN reaction that required hospitalization: yes Has patient had a PCN reaction occurring within the last 10 years: no If all of the above answers are "NO", then may proceed with Cephalosporin use.     History of Present Illness    [name] is a 82 y.o. female patient who was referred to the [facility] by Dr. Oval Linsey. In October of 2023 her BP was 182/80 while on amlodipine 10 mg daily, so valsartan/HCTZ 160/12.5 was added. In November of 2023 her BP was 162/86 and hydralazine 25 mg BID was added. Her valsartan was not increased due to recent hospital admission for AKI.   Patient presents today and states that she is doing okay and has just recently found out about a family history of Hypohidrotic ectodermal dysplasia (HED).  She has had some chest pain recently that normally happens in the morning and she becomes anxious trying to differentiate the cause of the pain. Her doctors normally attribute the chest pain to anxiety and COPD. She also  reports waking up every night from 12-1 and having trouble going back to sleep. She denies dizziness but states that she has had vision problems where everything turns orange. She was recently seen by this doctor for this and it was diagnosed as Amaurosis fugax. We reviewed her medication list and she states that our list is correct and she is taking all of her medications correctly.    Blood Pressure Goal:  130/80  Current Medications: amlodipine 10mg , valsartan/hctz 160/12.5, hydralazine 25 mg BID  Adherence Assessment  Do you ever forget to take your medication? "Every now and them" [x] Yes [] No  Do you ever skip doses due to side effects? [] Yes [x] No  Do you have trouble affording your medicines? [] Yes [x] No  Are you ever unable to pick up your medication due to transportation difficulties? [] Yes [] No  Do you ever stop taking your medications because you don't believe they are helping? [] Yes [] No   Adherence strategy:  She takes her medications in the morning with her coffee and at night times with water. Her pharmacy delivers her medications once every 90 days and calls her when she is due for a refill.  Barriers to obtaining medications: She has to crush some of her tablets due to them being too big for her to swallow but atorvastatin is the only medication she has trouble with.   Previously tried: none  Family Hx:    Parents/siblings: adopted - she does not know  Children: 3 of the 4 daughters have HED and stents One daughter recently  had a heart attack in front of the doctor another daughter passed away Her son also has HED   Social Hx:     Tobacco: started smoking at 82 years old, when she tried to quit in the 70s she gained a lot of weight so she decided to start smoking again to lose the weight. She recently quit at the end of November after being in the hospital and weaned herself off of the nicotine patches.  Alcohol: no  Caffeine: 1 cup of coffee every morning,  did do [employer] espresso drinks but not anymore  Diet:  Lots of vegetables (fresh, frozen and canned), chicken, fish twice a week, no snacking  Exercise: walks about 2.5-3 miles a day  Home BP readings: has been taking BP at home but didn't bring log. It is usually around 140/80 but sometimes goes up to 150-160 and never any lower.      Accessory Clinical Findings    Lab Results  Component Value Date   CREATININE 1.25 (H) 05/16/2022   BUN 18 05/16/2022   NA 141 05/16/2022   K 4.2 05/16/2022   CL 101 05/16/2022   CO2 26 05/16/2022   Lab Results  Component Value Date   ALT 9 04/17/2022   AST 17 04/17/2022   ALKPHOS 51 04/17/2022   BILITOT 0.4 04/17/2022   Lab Results  Component Value Date   HGBA1C 5.7 (H) 02/12/2012    Screening for Secondary Hypertension:      Relevant Labs/Studies:    Latest Ref Rng & Units 05/16/2022   11:30 AM 04/17/2022    9:36 AM 03/27/2022    3:24 AM  Basic Labs  Sodium 134 - 144 mmol/L 141  146  139   Potassium 3.5 - 5.2 mmol/L 4.2  4.0  4.7   Creatinine 0.57 - 1.00 mg/dL 0.62  6.94  8.54        Latest Ref Rng & Units 04/21/2019   11:38 AM  Thyroid   TSH 0.450 - 4.500 uIU/mL 2.830                   Home Medications    Current Outpatient Medications  Medication Sig Dispense Refill   hydrALAZINE (APRESOLINE) 50 MG tablet Take 1 tablet (50 mg total) by mouth 3 (three) times daily. 270 tablet 3   amLODipine (NORVASC) 10 MG tablet Take 1 tablet (10 mg total) by mouth daily. 90 tablet 3   arformoterol (BROVANA) 15 MCG/2ML NEBU Take 2 mLs (15 mcg total) by nebulization 2 (two) times daily. 120 mL 1   atorvastatin (LIPITOR) 80 MG tablet Take 1 tablet (80 mg total) by mouth daily. 90 tablet 3   budesonide (PULMICORT) 0.5 MG/2ML nebulizer solution Take 2 mLs (0.5 mg total) by nebulization 2 (two) times daily. 480 mL 0   clopidogrel (PLAVIX) 75 MG tablet TAKE ONE TABLET ONCE DAILY 90 tablet 0   cyclobenzaprine (FLEXERIL) 5 MG tablet  TAKE ONE TABLET THREE TIMES DAILY AS NEEDED FOR MUSCLE SPASMS 30 tablet 0   meloxicam (MOBIC) 15 MG tablet Take 1 tablet (15 mg total) by mouth daily. 30 tablet 1   pantoprazole (PROTONIX) 40 MG tablet Take 1 tablet (40 mg total) by mouth daily. 30 tablet 2   valsartan-hydrochlorothiazide (DIOVAN HCT) 160-12.5 MG tablet Take 1 tablet by mouth daily.     No current facility-administered medications for this visit.     Assessment & Plan   HYPERTENSION CONTROL Vitals:   06/12/22 1025 06/12/22  1032  BP: (!) 200/90 (!) 196/86    The patient's blood pressure is elevated above target today.  In order to address the patient's elevated BP: A current anti-hypertensive medication was adjusted today.      Essential hypertension with goal blood pressure less than 130/80 Assessment: BP is uncontrolled in office BP 196/86 mmHg; above the goal (<130/80) Unable to increase valsartan dose due to recent AKI and CKD Denies SOB, palpitations, headaches, or swelling Tolerates hydralazine and valsartan/hctz well without any side effects  Plan: Increase hydralazine to 50 mg three times daily Continue taking amlodipine 10 mg daily and valsartan/hctz 160/12.5 mg daily Instructed the patient to start checking BP at home and provided a log for her to record her numbers Patient to bring BP log, BP monitor, and medications in to her next visit Follow up in 3 weeks with the PharmD hypertension clinic Labs ordered today: none    Oda Kilts PharmD candidate [facility] class of 2024  I was with student and patient for entire visit and agree with the above assessment and plan.   Tommy Medal PharmD CPP [facility]  [address] [place], [place] [address] [phone]

## 2022-06-12 ENCOUNTER — Telehealth: Payer: Self-pay | Admitting: Pharmacist Clinician (PhC)/ Clinical Pharmacy Specialist

## 2022-06-12 ENCOUNTER — Encounter: Payer: Self-pay | Admitting: Pharmacist Clinician (PhC)/ Clinical Pharmacy Specialist

## 2022-06-12 ENCOUNTER — Encounter: Payer: Self-pay | Admitting: Family Medicine

## 2022-06-12 ENCOUNTER — Ambulatory Visit: Payer: 59 | Attending: Cardiology | Admitting: Pharmacist Clinician (PhC)/ Clinical Pharmacy Specialist

## 2022-06-12 ENCOUNTER — Encounter: Payer: Self-pay | Admitting: *Deleted

## 2022-06-12 VITALS — BP 196/86 | Ht 61.75 in | Wt 131.2 lb

## 2022-06-12 DIAGNOSIS — I10 Essential (primary) hypertension: Secondary | ICD-10-CM | POA: Diagnosis not present

## 2022-06-12 MED ORDER — HYDRALAZINE HCL 50 MG PO TABS: 50.0000 mg | ORAL_TABLET | Freq: Three times a day (TID) | ORAL | 3 refills | Status: DC

## 2022-06-12 NOTE — Patient Instructions (Signed)
Follow up appointment: February 7 at 8:30 am  Take your BP meds as follows:  Increase hydralazine to 50 mg three times daily   Continue with all other medications  Check your blood pressure at home daily (if able) and keep record of the readings  Always bring your blood pressure log with you to your appointments. If you have not brought your monitor in to be double checked for accuracy, please bring it to your next appointment.  Hypertension "High blood pressure"  Hypertension is often called "The Silent Killer." It rarely causes symptoms until it is extremely  high or has done damage to other organs in the body. For this reason, you should have your  blood pressure checked regularly by your physician. We will check your blood pressure  every time you see a provider at one of our offices.   Your blood pressure reading consists of two numbers. Ideally, blood pressure should be  below 120/80. The first ("top") number is called the systolic pressure. It measures the  pressure in your arteries as your heart beats. The second ("bottom") number is called the diastolic pressure. It measures the pressure in your arteries as the heart relaxes between beats.  The benefits of getting your blood pressure under control are enormous. A 10-point  reduction in systolic blood pressure can reduce your risk of stroke by 27% and heart failure by 28%  Your blood pressure goal is 130/80  To check your pressure at home you will need to:  1. Sit up in a chair, with feet flat on the floor and back supported. Do not cross your ankles or legs. 2. Rest your left arm so that the cuff is about heart level. If the cuff goes on your upper arm,  then just relax the arm on the table, arm of the chair or your lap. If you have a wrist cuff, we  suggest relaxing your wrist against your chest (think of it as Pledging the Flag with the  wrong arm).  3. Place the cuff snugly around your arm, about 1 inch above the  crook of your elbow. The  cords should be inside the groove of your elbow.  4. Sit quietly, with the cuff in place, for about 5 minutes. After that 5 minutes press the power  button to start a reading. 5. Do not talk or move while the reading is taking place.  6. Record your readings on a sheet of paper. Although most cuffs have a memory, it is often  easier to see a pattern developing when the numbers are all in front of you.  7. You can repeat the reading after 1-3 minutes if it is recommended  Make sure your bladder is empty and you have not had caffeine or tobacco within the last 30 min   You can find a list of quality blood pressure cuffs at [employer]     < 130/80

## 2022-06-12 NOTE — Telephone Encounter (Signed)
Patient phone left in lobby at [place] office. Family member called phone - informed where the phone was - patient / family will come pick up phone when able. Phone was put in lost and found at [place] office.

## 2022-06-12 NOTE — Assessment & Plan Note (Signed)
Assessment: BP is uncontrolled in office BP 196/86 mmHg; above the goal (<130/80) Unable to increase valsartan dose due to recent AKI and CKD Denies SOB, palpitations, headaches, or swelling Tolerates hydralazine and valsartan/hctz well without any side effects  Plan: Increase hydralazine to 50 mg three times daily Continue taking amlodipine 10 mg daily and valsartan/hctz 160/12.5 mg daily Instructed the patient to start checking BP at home and provided a log for her to record her numbers Patient to bring BP log, BP monitor, and medications in to her next visit Follow up in 3 weeks with the PharmD hypertension clinic Labs ordered today: none

## 2022-06-20 ENCOUNTER — Ambulatory Visit (INDEPENDENT_AMBULATORY_CARE_PROVIDER_SITE_OTHER): Payer: 59 | Admitting: Family Medicine

## 2022-06-20 ENCOUNTER — Encounter: Payer: Self-pay | Admitting: Family Medicine

## 2022-06-20 VITALS — BP 178/75 | HR 98 | Temp 97.7°F | Ht 61.75 in | Wt 133.4 lb

## 2022-06-20 DIAGNOSIS — Z23 Encounter for immunization: Secondary | ICD-10-CM

## 2022-06-20 DIAGNOSIS — M549 Dorsalgia, unspecified: Secondary | ICD-10-CM

## 2022-06-20 DIAGNOSIS — E782 Mixed hyperlipidemia: Secondary | ICD-10-CM | POA: Diagnosis not present

## 2022-06-20 DIAGNOSIS — M5430 Sciatica, unspecified side: Secondary | ICD-10-CM | POA: Diagnosis not present

## 2022-06-20 DIAGNOSIS — I10 Essential (primary) hypertension: Secondary | ICD-10-CM

## 2022-06-20 MED ORDER — BUDESONIDE-FORMOTEROL FUMARATE 160-4.5 MCG/ACT IN AERO: 2.0000 | INHALATION_SPRAY | Freq: Two times a day (BID) | RESPIRATORY_TRACT | 5 refills | Status: DC

## 2022-06-20 MED ORDER — ALBUTEROL SULFATE HFA 108 (90 BASE) MCG/ACT IN AERS: 2.0000 | INHALATION_SPRAY | Freq: Four times a day (QID) | RESPIRATORY_TRACT | 11 refills | Status: DC | PRN

## 2022-06-20 NOTE — Progress Notes (Signed)
Subjective:  Patient ID: [name], female    DOB: 15-Jul-1940  Age: 82 y.o. MRN: [id number]  CC: Medical Management of Chronic Issues   HPI [name] presents for  presents for  follow-up of hypertension. Patient has no history of headache chest pain or shortness of breath or recent cough. Patient also denies symptoms of TIA such as focal numbness or weakness. Patient denies side effects from medication. States taking it regularly. BP followed y bP clinic. Stopped smoking on10/31/2023, gaining weight.  Patient in for follow-up of elevated cholesterol. Doing well without complaints on current medication. Denies side effects of statin including myalgia and arthralgia and nausea. Also in today for liver function testing. Currently no chest pain, shortness of breath or other cardiovascular related symptoms noted.  Steel rod in neck. Can't have MRI.    Concerned inhaler is causing back cramps.       06/20/2022    9:41 AM 05/16/2022   10:35 AM 12/27/2021    2:56 PM  Depression screen PHQ 2/9  Decreased Interest 0 0 0  Down, Depressed, Hopeless 0 0 1  PHQ - 2 Score 0 0 1  Altered sleeping  0 1  Tired, decreased energy  0 1  Change in appetite  0 1  Feeling bad or failure about yourself   0 1  Trouble concentrating  0 0  Moving slowly or fidgety/restless  0 0  Suicidal thoughts  0 0  PHQ-9 Score  0 5  Difficult doing work/chores   Somewhat difficult    History [name] has a past medical history of Arthritis, Atypical chest pain (03/19/2022), GERD (gastroesophageal reflux disease), Headache(784.0), Hepatitis, History of gout, Hypertension, Hypertensive encephalopathy, 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   She has a past surgical history that includes Abdominal hysterectomy; Anterior cervical corpectomy (03/07/2012); Tonsillectomy; Multiple tooth extractions; Cataract extraction w/PHACO (Left,  04/12/2017); and Cataract extraction w/PHACO (Right, 05/03/2017).   Her family history includes Asthma in her daughter; Bipolar disorder in her daughter, daughter, daughter, daughter, and son; Drug abuse in her daughter; Heart disease in her daughter and daughter; Hypertension in her daughter. She was adopted.She reports that she quit smoking about 2 months ago. Her smoking use included cigarettes. She started smoking about 62 years ago. She has a 25.00 pack-year smoking history. She has never used smokeless tobacco. She reports current alcohol use. She reports that she does not use drugs.    ROS Review of Systems  Constitutional: Negative.   HENT: Negative.    Eyes:  Negative for visual disturbance.  Respiratory:  Negative for shortness of breath.   Cardiovascular:  Negative for chest pain.  Gastrointestinal:  Negative for abdominal pain.  Musculoskeletal:  Positive for neck pain. Negative for arthralgias.    Objective:  BP (!) 178/75   Pulse 98   Temp 97.7 F (36.5 C)   Ht 5' 1.75" (1.568 m)   Wt 133 lb 6.4 oz (60.5 kg)   SpO2 96%   BMI 24.60 kg/m   BP Readings from Last 3 Encounters:  06/20/22 (!) 178/75  06/12/22 (!) 196/86  05/16/22 130/73    Wt Readings from Last 3 Encounters:  06/20/22 133 lb 6.4 oz (60.5 kg)  06/12/22 131 lb 3.2 oz (59.5 kg)  05/16/22 128 lb (58.1 kg)     Physical Exam Constitutional:      General: She is not in acute distress.    Appearance: She is well-developed.  Cardiovascular:     Rate and Rhythm: Normal rate and regular rhythm.  Pulmonary:     Breath sounds: Normal breath sounds.  Musculoskeletal:        General: Normal range of motion.  Skin:    General: Skin is warm and dry.  Neurological:     Mental Status: She is alert and oriented to person, place, and time.       Assessment & Plan:   [name] was seen today for medical management of chronic issues.  Diagnoses and all orders for this visit:  Need for shingles  vaccine -     Zoster Recombinant (Shingrix )  Back pain with sciatica  Essential hypertension with goal blood pressure less than 130/80  Mixed hyperlipidemia  Other orders -     albuterol (VENTOLIN HFA) 108 (90 Base) MCG/ACT inhaler; Inhale 2 puffs into the lungs every 6 (six) hours as needed for wheezing or shortness of breath. -     budesonide-formoterol (SYMBICORT) 160-4.5 MCG/ACT inhaler; Inhale 2 puffs into the lungs 2 (two) times daily.       I am having [name] start on albuterol and budesonide-formoterol. I am also having her maintain her clopidogrel, amLODipine, pantoprazole, valsartan-hydrochlorothiazide, arformoterol, budesonide, atorvastatin, cyclobenzaprine, meloxicam, and hydrALAZINE.  Allergies as of 06/20/2022       Reactions   Penicillins Anaphylaxis, Swelling, Rash, Other (See Comments)   Has patient had a PCN reaction causing immediate rash, facial/tongue/throat swelling, SOB or lightheadedness with hypotension: yes Has patient had a PCN reaction causing severe rash involving mucus membranes or skin necrosis: no Has patient had a PCN reaction that required hospitalization: yes Has patient had a PCN reaction occurring within the last 10 years: no If all of the above answers are "NO", then may proceed with Cephalosporin use.        Medication List        Accurate as of June 20, 2022  5:35 PM. If you have any questions, ask your nurse or doctor.          albuterol 108 (90 Base) MCG/ACT inhaler Commonly known as: VENTOLIN HFA Inhale 2 puffs into the lungs every 6 (six) hours as needed for wheezing or shortness of breath. Started by: Claretta Fraise, MD   amLODipine 10 MG tablet Commonly known as: NORVASC Take 1 tablet (10 mg total) by mouth daily.   arformoterol 15 MCG/2ML Nebu Commonly known as: BROVANA Take 2 mLs (15 mcg total) by nebulization 2 (two) times daily.   atorvastatin 80 MG tablet Commonly known as: LIPITOR Take 1 tablet  (80 mg total) by mouth daily.   budesonide 0.5 MG/2ML nebulizer solution Commonly known as: PULMICORT Take 2 mLs (0.5 mg total) by nebulization 2 (two) times daily.   budesonide-formoterol 160-4.5 MCG/ACT inhaler Commonly known as: SYMBICORT Inhale 2 puffs into the lungs 2 (two) times daily. Started by: Claretta Fraise, MD   clopidogrel 75 MG tablet Commonly known as: PLAVIX TAKE ONE TABLET ONCE DAILY   cyclobenzaprine 5 MG tablet Commonly known as: FLEXERIL TAKE ONE TABLET THREE TIMES DAILY AS NEEDED FOR MUSCLE SPASMS   hydrALAZINE 50 MG tablet Commonly known as: APRESOLINE Take 1 tablet (50 mg total) by mouth 3 (three) times daily.   meloxicam 15 MG tablet Commonly known as: MOBIC Take 1 tablet (15 mg total) by mouth daily.   pantoprazole 40 MG tablet Commonly known as: PROTONIX Take 1 tablet (40 mg total) by mouth daily.   valsartan-hydrochlorothiazide 160-12.5 MG tablet  Commonly known as: Diovan HCT Take 1 tablet by mouth daily.         Follow-up: Return in about 6 months (around 12/19/2022).  Claretta Fraise, M.D.

## 2022-06-27 DIAGNOSIS — J441 Chronic obstructive pulmonary disease with (acute) exacerbation: Secondary | ICD-10-CM | POA: Diagnosis not present

## 2022-07-01 DIAGNOSIS — J449 Chronic obstructive pulmonary disease, unspecified: Secondary | ICD-10-CM | POA: Diagnosis not present

## 2022-07-04 ENCOUNTER — Ambulatory Visit: Payer: 59 | Attending: Cardiovascular Disease | Admitting: Pharmacist

## 2022-07-04 ENCOUNTER — Encounter: Payer: Self-pay | Admitting: Pharmacist

## 2022-07-04 VITALS — BP 178/84 | HR 81

## 2022-07-04 DIAGNOSIS — I10 Essential (primary) hypertension: Secondary | ICD-10-CM

## 2022-07-04 MED ORDER — HYDROCHLOROTHIAZIDE 12.5 MG PO CAPS: ORAL_CAPSULE | ORAL | 0 refills | Status: DC

## 2022-07-04 NOTE — Patient Instructions (Addendum)
It was nice meeting you today  We would like your blood pressure to be less than 140/90  Please continue your: Amlodpine 10mg  daily Hydralazine 50mg  three times a day Valsartan/Hydrochlorothiazide 160/12.5mg  daily  I am sending 2 weeks worth of Hydrochlorothiazide 12.5mg . I would like you to take 1 in the morning with your valsartan/hydrochlorothiazide combination  Please have your lab work checked in [place] next week on Thursday or Friday  Try to keep checking blood pressure at home  Please call or message with any questions  Karren Cobble, PharmD, [place], [place], [place], [place], [place], [address] Phone: [phone] , Fax: [phone]

## 2022-07-04 NOTE — Progress Notes (Signed)
Patient ID: [name]                 DOB: 30-May-1940                      MRN: [id number]     HPI: ADYSIN SPITALE is a 82 y.o. female referred by Dr. Oval Linsey to HTN clinic. PMH is significant for resistant HTN, PAD, COPD, TIA, CKD, HLD and history of smoking (quit 02/2022).  Patient presents today in good spirits. Blood pressures typically higher in office. Brought home readings. Typically 140s/80s. Nurse from [employer] came by and checked her BP yesterday and received 146/82 and 146/86.  Reports the increase in hydralazine is making her drowsy. She sometimes forgets the evening dose. Does not forget Diovan or amlodipine although she feels like she is on too many medications.  Titration of Valsartan/HCTZ has been difficult due to renal function.  Current HTN meds:  Amlodipine 19m daily Hydralazine 569mTID Valsartan/HCTZ 160/12.83m46maily  Home BP readings:   Wt Readings from Last 3 Encounters:  06/20/22 133 lb 6.4 oz (60.5 kg)  06/12/22 131 lb 3.2 oz (59.5 kg)  05/16/22 128 lb (58.1 kg)   BP Readings from Last 3 Encounters:  07/04/22 (!) 178/84  06/20/22 (!) 178/75  06/12/22 (!) 196/86   Pulse Readings from Last 3 Encounters:  07/04/22 81  06/20/22 98  05/16/22 83    Renal function: CrCl cannot be calculated (Patient's most recent lab result is older than the maximum 21 days allowed.).  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 1 each 11   amLODipine (NORVASC) 10 MG tablet Take 1 tablet (10 mg total) by mouth daily. 90 tablet 3   arformoterol (BROVANA) 15 MCG/2ML NEBU Take 2 mLs (15 mcg total) by nebulization 2 (two) times daily. 120 mL 1   atorvastatin (LIPITOR) 80 MG tablet Take 1 tablet (80 mg total) by mouth daily. 90 tablet 3   budesonide (PULMICORT) 0.5 MG/2ML nebulizer solution Take 2 mLs (0.5 mg total) by nebulization 2 (two) times daily. 480 mL 0   budesonide-formoterol (SYMBICORT) 160-4.5 MCG/ACT inhaler Inhale 2 puffs into the lungs 2 (two) times daily. 1 each 5   clopidogrel (PLAVIX) 75 MG tablet TAKE ONE TABLET ONCE DAILY 90 tablet 0   cyclobenzaprine (FLEXERIL) 5 MG tablet TAKE ONE TABLET THREE TIMES DAILY AS NEEDED FOR MUSCLE SPASMS 30 tablet 0   hydrALAZINE (APRESOLINE) 50 MG tablet Take 1 tablet (50 mg total) by mouth 3 (three) times daily. 270 tablet 3   meloxicam (MOBIC) 15 MG tablet Take 1 tablet (15 mg total) by mouth daily. 30 tablet 1   pantoprazole (PROTONIX) 40 MG tablet Take 1 tablet (40 mg total) by mouth daily. 30 tablet 2  valsartan-hydrochlorothiazide (DIOVAN HCT) 160-12.5 MG tablet Take 1 tablet by mouth daily.     No current facility-administered medications on file prior to visit.    Allergies  Allergen Reactions   Penicillins Anaphylaxis, Swelling, Rash and Other (See Comments)    Has patient had a PCN reaction causing immediate rash, facial/tongue/throat swelling, SOB or lightheadedness with hypotension: yes Has patient had a PCN reaction causing severe rash involving mucus membranes or skin necrosis: no Has patient had a PCN reaction that required hospitalization: yes Has patient had a PCN reaction occurring within the last 10 years: no If all of the above answers are "NO", then may proceed with Cephalosporin use.      Assessment/Plan:  1. Hypertension -  HYPERTENSION  CONTROL Vitals:   07/04/22 0818 07/04/22 0822  BP: (!) 184/83 (!) 178/84    The patient's blood pressure is elevated above target today.  In order to address the patient's elevated BP: A current anti-hypertensive medication was adjusted today.; The blood pressure is usually elevated in clinic.  Blood pressures monitored at home have been optimal.    Patient's BP remains uncontrolled despite increase in hydralazine. Home readings better managed but systolic readings still 0000000.  Would prefer to increase hydralazine however patient reports it makes her drowsy and she is hesitant. Advised other options would be increasing valsartan or HCTZ with close monitoring of renal function. Will add on additional HCTZ 12.63m and check BMP in 1 week. Patient agreeable. Advised to continue to monitor at home.  Continue amlodipine 143mdaily Continue hydralazine 5054mID Increase valsartan/HCTZ to 160/25 (pt to add additional 12.5mg58mTZ tablet) Check Bmp in 1-2 weeks  ChriKarren CobblearmD, BCACHurleyCEComerioP [place], [id number]: [phone]: [phone]

## 2022-07-13 ENCOUNTER — Telehealth: Payer: Self-pay | Admitting: Pharmacist

## 2022-07-13 NOTE — Telephone Encounter (Signed)
-----   Message from Rollen Sox, [facility] sent at 07/04/2022 11:41 AM EST ----- Regarding: BMP Check bmp

## 2022-07-13 NOTE — Telephone Encounter (Signed)
Called patient to remind to have labs drawn. Pt has had URTI and has been staying inside the house. Will have drawn next week if she feels better

## 2022-07-17 ENCOUNTER — Other Ambulatory Visit: Payer: Self-pay | Admitting: Family Medicine

## 2022-07-17 DIAGNOSIS — M549 Dorsalgia, unspecified: Secondary | ICD-10-CM

## 2022-07-25 ENCOUNTER — Ambulatory Visit (INDEPENDENT_AMBULATORY_CARE_PROVIDER_SITE_OTHER): Payer: 59 | Admitting: Pulmonary Disease

## 2022-07-25 ENCOUNTER — Encounter: Payer: Self-pay | Admitting: Pulmonary Disease

## 2022-07-25 VITALS — BP 120/66 | HR 64 | Ht 61.0 in | Wt 133.2 lb

## 2022-07-25 DIAGNOSIS — J4489 Other specified chronic obstructive pulmonary disease: Secondary | ICD-10-CM | POA: Diagnosis not present

## 2022-07-25 MED ORDER — REVEFENACIN 175 MCG/3ML IN SOLN: 175.0000 ug | Freq: Every day | RESPIRATORY_TRACT | 11 refills | Status: DC

## 2022-07-25 NOTE — Patient Instructions (Addendum)
Nice to meet you  I will send in [place] - use nebulized once daily in the mornings  Continue budesonide and arformoterol twice a day  Continue Duonebs as needed  Return to [facility]ionic in 3 months or sooner as needed

## 2022-07-25 NOTE — Progress Notes (Signed)
$'@Patient'x$  ID: [name], female    DOB: 1940/08/25, 82 y.o.   MRN: [id number]  Chief Complaint  Patient presents with   Follow-up    Pt is here for follow up for DOE. Pt states that she has quit smoking and feels like her breathing is worse now. Pt states she is using the albuterol as needed. Pt is also on Levoalbuterol as needed, Brovana and pulmort daily.     Referring provider: Claretta Fraise, MD  HPI:   82 y.o. woman whom we are seeing in consultation for evaluation of dyspnea on exertion large related to COPD confirmed on PFT 04/2022.  Most recent cardiology note reviewed.  Most recent PCP note x2 reviewed.    Overall doing okay.  Quit smoking in the interim.  Congratulated on this.  Feels like breathing will get worse.  Notably she contracted a virus about a month ago.  Still has ongoing lingering symptoms.  Suspect this is the reason for her worsening symptoms.  With worsening symptoms and improvement with DuoNebs as she endorses, discussed role and rationale for escalating nebulizer therapy to include LAMA.  She agrees with this.  Yupelri sent today.  To continue budesonide and arformoterol to achieve triple inhaled therapy.  PFTs performed in interim reviewed, full interpretation below but consistent with COPD and hyperinflation.    HPI at initial visit: Patient notes long history of dyspnea on exertion.  Worse with stairs or inclines.  However she remains active.  Walking up to 3 miles daily.  Unfortunate, developed severe shortness of breath end of October 2023.  Noted to be hypoxemic on room air via EMS.  Treated with steroids and inhaled bronchodilators.  Chest x-ray on my review interpretation reveals clear lungs bilaterally 03/25/2022.  Prior chest x-ray 03/12/2022 shows clear lungs bilaterally on my review and interpretation, this preceded hospitalization.  She improved.  Stable on room air.  Discharged on prednisone taper.  She is continued arformoterol and budesonide  nebulizer.  She felt this certainly helps.  She is albuterol prior to walking.  Relatively rare use of DuoNebs at the home.  She is not walking as far she was before but wants to get back up to longer walks.  She enjoys this.  She enjoys being outside, in nature, spends time praying.  She was seen by cardiology and a CT coronary scan was ordered to evaluate shortness of breath.  This did demonstrate coronary calcification is relatively significant with reported no significant flow limitation.  CT images 03/2022 demonstrate emphysema, thickened bronchioles, mosaicism, otherwise clear lungs on my review and interpretation.  PMH: Tobacco abuse in remission, hypertension, hyperlipidemia, GERD Surgical history: Hysterectomy, cataract surgery, tonsillectomy Family history: Bipolar disease, asthma, CAD, son with bipolar disease social history: Former smoker, 25-pack-year, quit in fall 2023, lives in [place] / Pulmonary Flowsheets:   ACT:      No data to display           MMRC:     No data to display           Epworth:      No data to display           Tests:   FENO:  No results found for: "NITRICOXIDE"  PFT:    Latest Ref Rng & Units 05/10/2022    3:19 PM  PFT Results  FVC-Pre L 1.79   FVC-Predicted Pre % 77   FVC-Post L 1.72   FVC-Predicted Post %  74   Pre FEV1/FVC % % 55   Post FEV1/FCV % % 55   FEV1-Pre L 0.99   FEV1-Predicted Pre % 58   FEV1-Post L 0.95   DLCO uncorrected ml/min/mmHg 11.57   DLCO UNC% % 68   DLCO corrected ml/min/mmHg 11.57   DLCO COR %Predicted % 68   DLVA Predicted % 74   TLC L 5.96   TLC % Predicted % 129   RV % Predicted % 164   Personally reviewed and interpreted as moderate fixed obstruction, no bronchodilator response, lung biopsy send with hyperinflation and air trapping, DLCO moderately reduced.  WALK:      No data to display           Imaging: Personally reviewed and as per EMR and  discussion in this note No results found.  Lab Results: Personally reviewed, notably is not as high as 500 02/2022 CBC    Component Value Date/Time   WBC 5.5 05/16/2022 1130   WBC 5.3 03/26/2022 0410   RBC 4.59 05/16/2022 1130   RBC 4.85 03/26/2022 0410   HGB 14.0 05/16/2022 1130   HCT 42.2 05/16/2022 1130   PLT 158 05/16/2022 1130   MCV 92 05/16/2022 1130   MCH 30.5 05/16/2022 1130   MCH 30.5 03/26/2022 0410   MCHC 33.2 05/16/2022 1130   MCHC 32.3 03/26/2022 0410   RDW 12.0 05/16/2022 1130   LYMPHSABS 2.0 05/16/2022 1130   MONOABS 0.2 03/25/2022 0818   EOSABS 0.1 05/16/2022 1130   BASOSABS 0.0 05/16/2022 1130    BMET    Component Value Date/Time   NA 141 05/16/2022 1130   K 4.2 05/16/2022 1130   CL 101 05/16/2022 1130   CO2 26 05/16/2022 1130   GLUCOSE 99 05/16/2022 1130   GLUCOSE 130 (H) 03/27/2022 0324   BUN 18 05/16/2022 1130   CREATININE 1.25 (H) 05/16/2022 1130   CALCIUM 9.5 05/16/2022 1130   GFRNONAA 43 (L) 03/27/2022 0324   GFRAA 53 (L) 01/23/2020 0700    BNP No results found for: "BNP"  ProBNP No results found for: "PROBNP"  Specialty Problems       Pulmonary Problems   COPD with chronic bronchitis   Aspiration pneumonia (HCC)   Acute respiratory failure with hypoxia (HCC)   Respiratory failure with hypoxia (HCC)   COPD exacerbation (HCC)    Allergies  Allergen Reactions   Penicillins Anaphylaxis, Swelling, Rash and Other (See Comments)    Has patient had a PCN reaction causing immediate rash, facial/tongue/throat swelling, SOB or lightheadedness with hypotension: yes Has patient had a PCN reaction causing severe rash involving mucus membranes or skin necrosis: no Has patient had a PCN reaction that required hospitalization: yes Has patient had a PCN reaction occurring within the last 10 years: no If all of the above answers are "NO", then may proceed with Cephalosporin use.     Immunization History  Administered Date(s) Administered    COVID-19, mRNA, vaccine(Comirnaty)12 years and older 03/06/2022   Fluad Quad(high Dose 65+) 02/26/2016, 02/25/2017, 02/26/2018, 02/24/2020, 02/21/2021   Influenza Split 02/13/2012   Influenza, High Dose Seasonal PF 02/19/2019   Influenza-Unspecified 03/28/2013   Moderna Covid-19 Vaccine Bivalent Booster 61yr & up 07/18/2021   Moderna Sars-Covid-2 Vaccination 07/23/2019, 08/21/2019, 03/23/2020   Pneumococcal Conjugate-13 11/24/2014   Pneumococcal Polysaccharide-23 07/09/2016   Pneumococcal-Unspecified 07/09/2016   Tdap 11/24/2014   Zoster Recombinat (Shingrix) 12/27/2021, 06/20/2022    Past Medical History:  Diagnosis Date   Arthritis    OA  Atypical chest pain 03/19/2022   GERD (gastroesophageal reflux disease)    Headache(784.0)    Hepatitis    history of Hepatitis 20 years ago; not sure what kind   History of gout    Hypertension    Hypertensive encephalopathy    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   Stroke ([place])    SLIGHT RT SIDE WEAKNESS 2001   Unintentional weight loss 03/19/2022    Tobacco History: Social History   Tobacco Use  Smoking Status Former   Packs/day: 0.50   Years: 50.00   Total pack years: 25.00   Types: Cigarettes   Start date: 61   Quit date: 03/24/2022   Years since quitting: 0.3  Smokeless Tobacco Never   Counseling given: Not Answered   Continue to not smoke  Outpatient Encounter Medications as of 07/25/2022  Medication Sig   albuterol (VENTOLIN HFA) 108 (90 Base) MCG/ACT inhaler Inhale 2 puffs into the lungs every 6 (six) hours as needed for wheezing or shortness of breath.   amLODipine (NORVASC) 10 MG tablet Take 1 tablet (10 mg total) by mouth daily.   atorvastatin (LIPITOR) 80 MG tablet Take 1 tablet (80 mg total) by mouth daily.   budesonide (PULMICORT) 0.5 MG/2ML nebulizer solution Take 2 mLs (0.5 mg total) by nebulization 2 (two) times daily.   budesonide-formoterol (SYMBICORT) 160-4.5 MCG/ACT inhaler Inhale 2  puffs into the lungs 2 (two) times daily.   clopidogrel (PLAVIX) 75 MG tablet TAKE ONE TABLET ONCE DAILY   cyclobenzaprine (FLEXERIL) 5 MG tablet TAKE ONE TABLET THREE TIMES DAILY AS NEEDED FOR MUSCLE SPASMS   hydrALAZINE (APRESOLINE) 50 MG tablet Take 1 tablet (50 mg total) by mouth 3 (three) times daily.   hydrochlorothiazide (MICROZIDE) 12.5 MG capsule Take 1 tablet by mouth in the morning along with Valsartan/Hydrochlorothiazide 160/12.'5mg'$  daily   levalbuterol (XOPENEX HFA) 45 MCG/ACT inhaler INHALE 2 PUFFS EVERY 4 HOURS AS NEEDED FOR WHEEZING OR SHORTNESS OF BREATH   meloxicam (MOBIC) 15 MG tablet Take 1 tablet (15 mg total) by mouth daily.   pantoprazole (PROTONIX) 40 MG tablet Take 1 tablet (40 mg total) by mouth daily.   revefenacin (YUPELRI) 175 MCG/3ML nebulizer solution Take 3 mLs (175 mcg total) by nebulization daily.   valsartan-hydrochlorothiazide (DIOVAN HCT) 160-12.5 MG tablet Take 1 tablet by mouth daily.   arformoterol (BROVANA) 15 MCG/2ML NEBU Take 2 mLs (15 mcg total) by nebulization 2 (two) times daily.   No facility-administered encounter medications on file as of 07/25/2022.     Review of Systems  Review of Systems  N/a Physical Exam  BP 120/66 (BP Location: Left Arm, Patient Position: Sitting, Cuff Size: Normal)   Pulse 64   Ht '5\' 1"'$  (1.549 m)   Wt 133 lb 3.2 oz (60.4 kg)   SpO2 97%   BMI 25.17 kg/m   Wt Readings from Last 5 Encounters:  07/25/22 133 lb 3.2 oz (60.4 kg)  06/20/22 133 lb 6.4 oz (60.5 kg)  06/12/22 131 lb 3.2 oz (59.5 kg)  05/16/22 128 lb (58.1 kg)  04/17/22 126 lb (57.2 kg)    BMI Readings from Last 5 Encounters:  07/25/22 25.17 kg/m  06/20/22 24.60 kg/m  06/12/22 24.19 kg/m  05/16/22 24.19 kg/m  04/17/22 23.81 kg/m     Physical Exam General: Sitting in chair, no acute distress Eyes: EOMI, icterus Neck: Supple, JVP Pulmonary: Clear, distant, normal work of breathing Cardiovascular: Warm, no edema Abdomen: Thin, sounds  present MSK: No synovitis, no joint effusion Neuro:  Normal gait, no weakness Psych: Normal mood, full affect   Assessment & Plan:   COPD Gold B: No PFTs 04/2022 moderate obstruction.  Some worsening symptoms despite ICS LABA nebulizer.  Add Yupelri for LAMA, to achieve triple inhaled therapy.  Continue DuoNebs as needed.  If you feel are too expensive, would advise to use DuoNebs 2-3 times daily scheduled as well as as needed.  DOE: Suspect related to COPD.  Changing regimen as above.  Tobacco abuse in remission: She was congratulated on ongoing abstinence.  Return in about 3 months (around 10/23/2022).   Lanier Clam, MD 07/25/2022

## 2022-07-26 DIAGNOSIS — J441 Chronic obstructive pulmonary disease with (acute) exacerbation: Secondary | ICD-10-CM | POA: Diagnosis not present

## 2022-08-06 DIAGNOSIS — H5213 Myopia, bilateral: Secondary | ICD-10-CM | POA: Diagnosis not present

## 2022-08-07 ENCOUNTER — Telehealth: Payer: Self-pay | Admitting: Family Medicine

## 2022-08-07 NOTE — Telephone Encounter (Signed)
Wants to speak with nurse or Tiffany regarding her MRI results from December. Says she never heard from anyone or received letter in the mail.

## 2022-08-07 NOTE — Telephone Encounter (Signed)
Patient needs her [place] appt rescheduled at [facility]. Says she didn't know anything about it until Forestine Na called her today. Patient uses [facility] and has to give them at least 3 days notice of appts. Was told by Forestine Na that they dont r/s those appts and that PCP office will have to do it.   Please call patient with new appointment info.

## 2022-08-07 NOTE — Telephone Encounter (Signed)
Returned patients call and answered all her questions.

## 2022-08-08 ENCOUNTER — Ambulatory Visit (HOSPITAL_COMMUNITY): Payer: 59 | Attending: Family Medicine

## 2022-08-14 ENCOUNTER — Telehealth: Payer: Self-pay | Admitting: Family Medicine

## 2022-08-14 NOTE — Telephone Encounter (Signed)
Called patient to schedule Medicare Annual Wellness Visit (AWV). No voicemail available to leave a message.  Last date of AWV: 01/27/2021   Please schedule an appointment at any time with either Mickel Baas or [place], [facility]. .  If any questions, please contact me at [phone].  Thank you,  Colletta Maryland,  [facility] Direct Dial ??[phone]

## 2022-08-23 DIAGNOSIS — J449 Chronic obstructive pulmonary disease, unspecified: Secondary | ICD-10-CM | POA: Diagnosis not present

## 2022-08-24 ENCOUNTER — Ambulatory Visit (HOSPITAL_COMMUNITY)
Admission: RE | Admit: 2022-08-24 | Discharge: 2022-08-24 | Disposition: A | Discharge status: Home / Self Care | Payer: 59 | Source: Ambulatory Visit | Attending: Family Medicine | Admitting: Family Medicine

## 2022-08-24 DIAGNOSIS — G453 Amaurosis fugax: Secondary | ICD-10-CM | POA: Insufficient documentation

## 2022-08-24 DIAGNOSIS — R42 Dizziness and giddiness: Secondary | ICD-10-CM | POA: Insufficient documentation

## 2022-08-24 DIAGNOSIS — I6523 Occlusion and stenosis of bilateral carotid arteries: Secondary | ICD-10-CM | POA: Diagnosis not present

## 2022-08-26 DIAGNOSIS — J441 Chronic obstructive pulmonary disease with (acute) exacerbation: Secondary | ICD-10-CM | POA: Diagnosis not present

## 2022-09-10 DIAGNOSIS — H52223 Regular astigmatism, bilateral: Secondary | ICD-10-CM | POA: Diagnosis not present

## 2022-09-10 DIAGNOSIS — H524 Presbyopia: Secondary | ICD-10-CM | POA: Diagnosis not present

## 2022-09-14 ENCOUNTER — Other Ambulatory Visit: Payer: Self-pay | Admitting: Family Medicine

## 2022-09-16 ENCOUNTER — Emergency Department (HOSPITAL_COMMUNITY): Payer: 59

## 2022-09-16 ENCOUNTER — Inpatient Hospital Stay (HOSPITAL_COMMUNITY)
Admission: EM | Admit: 2022-09-16 | Discharge: 2022-09-20 | DRG: 305 | Disposition: A | Discharge status: Home / Self Care | Payer: 59 | Attending: Family Medicine | Admitting: Family Medicine

## 2022-09-16 ENCOUNTER — Other Ambulatory Visit: Payer: Self-pay

## 2022-09-16 DIAGNOSIS — M1990 Unspecified osteoarthritis, unspecified site: Secondary | ICD-10-CM | POA: Diagnosis present

## 2022-09-16 DIAGNOSIS — Z981 Arthrodesis status: Secondary | ICD-10-CM

## 2022-09-16 DIAGNOSIS — Z7951 Long term (current) use of inhaled steroids: Secondary | ICD-10-CM

## 2022-09-16 DIAGNOSIS — R0789 Other chest pain: Secondary | ICD-10-CM | POA: Diagnosis not present

## 2022-09-16 DIAGNOSIS — Z880 Allergy status to penicillin: Secondary | ICD-10-CM

## 2022-09-16 DIAGNOSIS — R072 Precordial pain: Secondary | ICD-10-CM | POA: Diagnosis not present

## 2022-09-16 DIAGNOSIS — E538 Deficiency of other specified B group vitamins: Secondary | ICD-10-CM | POA: Diagnosis present

## 2022-09-16 DIAGNOSIS — N1831 Chronic kidney disease, stage 3a: Secondary | ICD-10-CM | POA: Diagnosis present

## 2022-09-16 DIAGNOSIS — Z961 Presence of intraocular lens: Secondary | ICD-10-CM | POA: Diagnosis present

## 2022-09-16 DIAGNOSIS — E876 Hypokalemia: Secondary | ICD-10-CM | POA: Diagnosis present

## 2022-09-16 DIAGNOSIS — R519 Headache, unspecified: Secondary | ICD-10-CM | POA: Diagnosis not present

## 2022-09-16 DIAGNOSIS — Z7902 Long term (current) use of antithrombotics/antiplatelets: Secondary | ICD-10-CM | POA: Diagnosis not present

## 2022-09-16 DIAGNOSIS — M109 Gout, unspecified: Secondary | ICD-10-CM | POA: Diagnosis not present

## 2022-09-16 DIAGNOSIS — Z87891 Personal history of nicotine dependence: Secondary | ICD-10-CM | POA: Diagnosis not present

## 2022-09-16 DIAGNOSIS — I10 Essential (primary) hypertension: Secondary | ICD-10-CM | POA: Diagnosis not present

## 2022-09-16 DIAGNOSIS — R29818 Other symptoms and signs involving the nervous system: Secondary | ICD-10-CM | POA: Diagnosis not present

## 2022-09-16 DIAGNOSIS — F411 Generalized anxiety disorder: Secondary | ICD-10-CM | POA: Diagnosis present

## 2022-09-16 DIAGNOSIS — I129 Hypertensive chronic kidney disease with stage 1 through stage 4 chronic kidney disease, or unspecified chronic kidney disease: Secondary | ICD-10-CM | POA: Diagnosis not present

## 2022-09-16 DIAGNOSIS — I160 Hypertensive urgency: Principal | ICD-10-CM | POA: Diagnosis present

## 2022-09-16 DIAGNOSIS — K219 Gastro-esophageal reflux disease without esophagitis: Secondary | ICD-10-CM | POA: Diagnosis not present

## 2022-09-16 DIAGNOSIS — H538 Other visual disturbances: Secondary | ICD-10-CM | POA: Diagnosis not present

## 2022-09-16 DIAGNOSIS — J4489 Other specified chronic obstructive pulmonary disease: Secondary | ICD-10-CM | POA: Diagnosis not present

## 2022-09-16 DIAGNOSIS — Z8249 Family history of ischemic heart disease and other diseases of the circulatory system: Secondary | ICD-10-CM

## 2022-09-16 DIAGNOSIS — G8929 Other chronic pain: Secondary | ICD-10-CM | POA: Diagnosis present

## 2022-09-16 DIAGNOSIS — I701 Atherosclerosis of renal artery: Secondary | ICD-10-CM | POA: Diagnosis not present

## 2022-09-16 DIAGNOSIS — T508X5A Adverse effect of diagnostic agents, initial encounter: Secondary | ICD-10-CM | POA: Diagnosis not present

## 2022-09-16 DIAGNOSIS — Z8673 Personal history of transient ischemic attack (TIA), and cerebral infarction without residual deficits: Secondary | ICD-10-CM | POA: Diagnosis not present

## 2022-09-16 DIAGNOSIS — N179 Acute kidney failure, unspecified: Secondary | ICD-10-CM | POA: Diagnosis present

## 2022-09-16 DIAGNOSIS — M25551 Pain in right hip: Secondary | ICD-10-CM | POA: Diagnosis not present

## 2022-09-16 DIAGNOSIS — I700 Atherosclerosis of aorta: Secondary | ICD-10-CM | POA: Diagnosis not present

## 2022-09-16 DIAGNOSIS — Z743 Need for continuous supervision: Secondary | ICD-10-CM | POA: Diagnosis not present

## 2022-09-16 DIAGNOSIS — Z90710 Acquired absence of both cervix and uterus: Secondary | ICD-10-CM

## 2022-09-16 DIAGNOSIS — R079 Chest pain, unspecified: Secondary | ICD-10-CM | POA: Diagnosis not present

## 2022-09-16 DIAGNOSIS — I252 Old myocardial infarction: Secondary | ICD-10-CM

## 2022-09-16 DIAGNOSIS — R569 Unspecified convulsions: Secondary | ICD-10-CM | POA: Diagnosis not present

## 2022-09-16 DIAGNOSIS — Z79899 Other long term (current) drug therapy: Secondary | ICD-10-CM | POA: Diagnosis not present

## 2022-09-16 DIAGNOSIS — M5441 Lumbago with sciatica, right side: Secondary | ICD-10-CM | POA: Diagnosis not present

## 2022-09-16 DIAGNOSIS — L8990 Pressure ulcer of unspecified site, unspecified stage: Secondary | ICD-10-CM | POA: Diagnosis present

## 2022-09-16 DIAGNOSIS — L89312 Pressure ulcer of right buttock, stage 2: Secondary | ICD-10-CM | POA: Diagnosis present

## 2022-09-16 DIAGNOSIS — I161 Hypertensive emergency: Principal | ICD-10-CM

## 2022-09-16 DIAGNOSIS — M545 Low back pain, unspecified: Secondary | ICD-10-CM | POA: Diagnosis not present

## 2022-09-16 DIAGNOSIS — R6889 Other general symptoms and signs: Secondary | ICD-10-CM | POA: Diagnosis not present

## 2022-09-16 DIAGNOSIS — J449 Chronic obstructive pulmonary disease, unspecified: Secondary | ICD-10-CM | POA: Diagnosis not present

## 2022-09-16 DIAGNOSIS — R4182 Altered mental status, unspecified: Secondary | ICD-10-CM | POA: Diagnosis not present

## 2022-09-16 LAB — CBC
HCT: 45.6 % (ref 36.0–46.0)
Hemoglobin: 14.7 g/dL (ref 12.0–15.0)
MCH: 30.7 pg (ref 26.0–34.0)
MCHC: 32.2 g/dL (ref 30.0–36.0)
MCV: 95.2 fL (ref 80.0–100.0)
Platelets: 157 10*3/uL (ref 150–400)
RBC: 4.79 MIL/uL (ref 3.87–5.11)
RDW: 13.4 % (ref 11.5–15.5)
WBC: 4.6 10*3/uL (ref 4.0–10.5)
nRBC: 0 % (ref 0.0–0.2)

## 2022-09-16 LAB — BASIC METABOLIC PANEL
Anion gap: 9 (ref 5–15)
BUN: 17 mg/dL (ref 8–23)
CO2: 30 mmol/L (ref 22–32)
Calcium: 8.9 mg/dL (ref 8.9–10.3)
Chloride: 102 mmol/L (ref 98–111)
Creatinine, Ser: 0.99 mg/dL (ref 0.44–1.00)
GFR, Estimated: 58 mL/min — ABNORMAL LOW (ref >60)
Glucose, Bld: 93 mg/dL (ref 70–99)
Potassium: 4 mmol/L (ref 3.5–5.1)
Sodium: 141 mmol/L (ref 135–145)

## 2022-09-16 LAB — TROPONIN I (HIGH SENSITIVITY)
Troponin I (High Sensitivity): 11 ng/L (ref <18)
Troponin I (High Sensitivity): 13 ng/L (ref <18)

## 2022-09-16 LAB — MAGNESIUM: Magnesium: 2.3 mg/dL (ref 1.7–2.4)

## 2022-09-16 MED ORDER — NITROGLYCERIN 2 % TD OINT
1.0000 [in_us] | TOPICAL_OINTMENT | Freq: Once | TRANSDERMAL | Status: AC
  Administered 2022-09-16: 1 [in_us] via TOPICAL
  Filled 2022-09-16: qty 1

## 2022-09-16 MED ORDER — CYCLOBENZAPRINE HCL 10 MG PO TABS
5.0000 mg | ORAL_TABLET | Freq: Three times a day (TID) | ORAL | Status: DC | PRN
  Administered 2022-09-17 – 2022-09-20 (×8): 5 mg via ORAL
  Filled 2022-09-16 (×8): qty 1

## 2022-09-16 MED ORDER — MOMETASONE FURO-FORMOTEROL FUM 200-5 MCG/ACT IN AERO
2.0000 | INHALATION_SPRAY | Freq: Two times a day (BID) | RESPIRATORY_TRACT | Status: DC
  Administered 2022-09-17: 2 via RESPIRATORY_TRACT
  Filled 2022-09-16: qty 8.8

## 2022-09-16 MED ORDER — HYDRALAZINE HCL 20 MG/ML IJ SOLN
5.0000 mg | Freq: Once | INTRAMUSCULAR | Status: AC
  Administered 2022-09-16: 5 mg via INTRAVENOUS
  Filled 2022-09-16: qty 1

## 2022-09-16 MED ORDER — ARFORMOTEROL TARTRATE 15 MCG/2ML IN NEBU
15.0000 ug | INHALATION_SOLUTION | Freq: Two times a day (BID) | RESPIRATORY_TRACT | Status: DC
  Administered 2022-09-17 (×3): 15 ug via RESPIRATORY_TRACT
  Filled 2022-09-16 (×3): qty 2

## 2022-09-16 MED ORDER — CLOPIDOGREL BISULFATE 75 MG PO TABS
75.0000 mg | ORAL_TABLET | Freq: Every day | ORAL | Status: DC
  Administered 2022-09-17 – 2022-09-20 (×4): 75 mg via ORAL
  Filled 2022-09-16 (×4): qty 1

## 2022-09-16 MED ORDER — POLYETHYLENE GLYCOL 3350 17 G PO PACK: 17.0000 g | PACK | Freq: Every day | ORAL | Status: DC | PRN

## 2022-09-16 MED ORDER — HYDRALAZINE HCL 25 MG PO TABS
50.0000 mg | ORAL_TABLET | Freq: Three times a day (TID) | ORAL | Status: DC
  Administered 2022-09-16 – 2022-09-20 (×9): 50 mg via ORAL
  Filled 2022-09-16 (×11): qty 2

## 2022-09-16 MED ORDER — AMLODIPINE BESYLATE 5 MG PO TABS
10.0000 mg | ORAL_TABLET | Freq: Every day | ORAL | Status: DC
  Administered 2022-09-17 – 2022-09-20 (×4): 10 mg via ORAL
  Filled 2022-09-16 (×4): qty 2

## 2022-09-16 MED ORDER — ACETAMINOPHEN 650 MG RE SUPP: 650.0000 mg | Freq: Four times a day (QID) | RECTAL | Status: DC | PRN

## 2022-09-16 MED ORDER — ACETAMINOPHEN 325 MG PO TABS
650.0000 mg | ORAL_TABLET | Freq: Four times a day (QID) | ORAL | Status: DC | PRN
  Administered 2022-09-17 (×2): 650 mg via ORAL
  Filled 2022-09-16 (×2): qty 2

## 2022-09-16 MED ORDER — ASPIRIN 81 MG PO CHEW
324.0000 mg | CHEWABLE_TABLET | Freq: Once | ORAL | Status: DC
  Filled 2022-09-16: qty 4

## 2022-09-16 MED ORDER — BACLOFEN 10 MG PO TABS
10.0000 mg | ORAL_TABLET | Freq: Once | ORAL | Status: AC
  Administered 2022-09-16: 10 mg via ORAL
  Filled 2022-09-16: qty 1

## 2022-09-16 MED ORDER — HYDRALAZINE HCL 20 MG/ML IJ SOLN
10.0000 mg | Freq: Once | INTRAMUSCULAR | Status: AC
  Administered 2022-09-16: 10 mg via INTRAVENOUS
  Filled 2022-09-16: qty 1

## 2022-09-16 MED ORDER — IOHEXOL 350 MG/ML SOLN
100.0000 mL | Freq: Once | INTRAVENOUS | Status: AC | PRN
  Administered 2022-09-16: 100 mL via INTRAVENOUS

## 2022-09-16 MED ORDER — NICARDIPINE HCL IN NACL 20-0.86 MG/200ML-% IV SOLN
3.0000 mg/h | INTRAVENOUS | Status: DC
  Administered 2022-09-16: 5 mg/h via INTRAVENOUS
  Administered 2022-09-17 (×2): 3 mg/h via INTRAVENOUS
  Filled 2022-09-16 (×3): qty 200

## 2022-09-16 MED ORDER — ATORVASTATIN CALCIUM 40 MG PO TABS
80.0000 mg | ORAL_TABLET | Freq: Every day | ORAL | Status: DC
  Administered 2022-09-17 – 2022-09-20 (×4): 80 mg via ORAL
  Filled 2022-09-16 (×4): qty 2

## 2022-09-16 MED ORDER — ENOXAPARIN SODIUM 40 MG/0.4ML IJ SOSY
40.0000 mg | PREFILLED_SYRINGE | INTRAMUSCULAR | Status: DC
  Administered 2022-09-17: 40 mg via SUBCUTANEOUS
  Filled 2022-09-16 (×2): qty 0.4

## 2022-09-16 NOTE — ED Provider Notes (Signed)
  Physical Exam  BP (!) 237/97   Pulse 94   Temp 98.2 F (36.8 C) (Oral)   Resp (!) 21   Ht  (1.549 m)   Wt 63.5 kg   SpO2 96%   BMI 26.45 kg/m   Physical Exam Vitals and nursing note reviewed.  Constitutional:      General: She is not in acute distress.    Appearance: She is well-developed.  HENT:     Head: Normocephalic and atraumatic.  Eyes:     Conjunctiva/sclera: Conjunctivae normal.  Cardiovascular:     Rate and Rhythm: Normal rate and regular rhythm.     Heart sounds: No murmur heard. Pulmonary:     Effort: Pulmonary effort is normal. No respiratory distress.  Musculoskeletal:        General: No swelling.     Cervical back: Neck supple.  Skin:    General: Skin is warm and dry.     Capillary Refill: Capillary refill takes less than 2 seconds.  Neurological:     Mental Status: She is alert.  Psychiatric:        Mood and Affect: Mood normal.     Procedures  .Critical Care  Performed by: Glendora Score, MD Authorized by: Glendora Score, MD   Critical care provider statement:    Critical care time (minutes):  30   Critical care was time spent personally by me on the following activities:  Development of treatment plan with patient or surrogate, discussions with consultants, evaluation of patient's response to treatment, examination of patient, ordering and review of laboratory studies, ordering and review of radiographic studies, ordering and performing treatments and interventions, pulse oximetry, re-evaluation of patient's condition and review of old charts   ED Course / MDM   Clinical Course as of 09/16/22 1542  Sun Sep 16, 2022  1227 Chest x-ray interpreted by me as no acute infiltrate.  Awaiting radiology reading. [MB]  1243 Patient had taken aspirin already this morning and EMS gave nitro.  [MB]    Clinical Course User Index [MB] Terrilee Files, MD   Medical Decision Making Amount and/or Complexity of Data Reviewed Labs:  ordered. Radiology: ordered.  Risk Prescription drug management. Decision regarding hospitalization.   Patient received in handoff.  Chest pain with hypertensive urgency/emergency.  Pending delta troponin and recheck after second hydralazine dose.  Patient has received a total of 15 mg of IV hydralazine and an inch of Nitropaste with no improvement of her blood pressure.  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  I spoke with the vascular surgeon on-call Dr. Chestine Spore who states that there is good flow in the celiac trunk and the SMA and the patient would not require aggressive intervention for the IMA.  In regards to the renal artery stenosis, this can be followed outpatient in clinic.,  But is likely driving some of this hypertension.  Patient started on nicardipine and will require hospital admission for hypertensive emergency.         Glendora Score, MD 09/16/22 [phone]

## 2022-09-16 NOTE — ED Triage Notes (Signed)
Pt c/o chest pain this morning , epigastric area and some slightly to left arm. No n/v or sweating noted. [facility] gave 4 mg of nitro for high bp. BP was 23/ and went down to 180 systolic. Pt ambulated to EMS. 20 R AC

## 2022-09-16 NOTE — Assessment & Plan Note (Addendum)
Blood pressure systolic up to 237.  Reports atypical chest pain.  Reports compliance with all hypertensives hydralazine 50 -3 times daily, valsartan HCTZ 160-0.5, and Norvasc 10 mg. -  hydralazine given in ED without improvement in blood pressure, subsequently started on nicardipine drip -Target blood pressure systolic 160s to 161W -Wean off nicardipine drip -Hold valsartan HCTZ for contrast exposure, resume other 2 home antihypertensives

## 2022-09-16 NOTE — ED Notes (Signed)
Pt c/o nausea. Provided emesis basin. Also c/o left leg cramping in the thigh.

## 2022-09-16 NOTE — Assessment & Plan Note (Signed)
-   Resume Protonix ?

## 2022-09-16 NOTE — Assessment & Plan Note (Addendum)
Stable.  Resume home regimen 

## 2022-09-16 NOTE — H&P (Signed)
History and Physical    [name] ION:[id number] DOB: 01/25/1941 DOA: 09/16/2022  PCP: Mechele Claude, MD   Patient coming from: Home  I have personally briefly reviewed patient's old medical records in [facility]  Chief Complaint: Chest Pain  HPI: [name] is a 82 y.o. female with medical history significant for stroke, hypertension, hypertensive encephalopathy. Patient presented to the ED with complaints of chest pain that started suddenly today.  She reports she had been resting, got up to get a coffee when her chest pain started, with radiation down her left arm, described as pressure-like, with transient blurry vision.  She reports this chest pain is different from her chronic chest pains.  She reports her blood pressure was high, so she called EMS.  No nausea no vomiting no diaphoresis, no difficulty breathing.  EMS gave 4 mg nitro.  She reports compliance with her antihypertensives.  She denies recent chest pains with activity. She reports generalized body cramps since been in the ED  ED Course: Temperature 98.1.  Heart rate 60s to 102.  Respirate rate 14-29.  Blood pressure systolic initially up to 237.  Given total of 50 mg of hydralazine without improvement. EKG shows sinus rhythm rate 70, no acute abnormalities.  Troponin 11 > 13. CTA chest/Abd/Pelvis- shows Moderate partially calcified atherosclerotic plaque overall. No dissection or aneurysm formation. Focal stenosis seen of the left renal artery with moderate atrophy of the left kidney. Please correlate for level of hypertension. Occluded inferior mesenteric artery.  Nicardipine drip started with improvement in blood pressure down to 140s systolic.  EDP talked to Vascular Surg- Dr. Chestine Spore who states that there is good flow in the celiac trunk and the SMA and the patient would not require aggressive intervention for the IMA.  In regards to the renal artery stenosis, this can be followed outpatient in clinic.    Review of Systems: As per HPI all other systems reviewed and negative.  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   Stroke (HCC)    SLIGHT RT SIDE WEAKNESS 2001   Unintentional weight loss 03/19/2022    Past Surgical History:  Procedure Laterality Date   ABDOMINAL HYSTERECTOMY     ANTERIOR CERVICAL CORPECTOMY  03/07/2012   Procedure: ANTERIOR CERVICAL CORPECTOMY;  Surgeon: Barnett Abu, MD;  Location: [facility];  Service: Neurosurgery;  Laterality: N/A;  Cervical six-seven, cervical seven-thoracic one Anterior cervical decompression/diskectomy/fusion, with Cervical seven Corpectomy, reconstruction using Allograft and Alphatec plate   CATARACT EXTRACTION W/PHACO Left 04/12/2017   Procedure: CATARACT EXTRACTION PHACO AND INTRAOCULAR LENS PLACEMENT (IOC);  Surgeon: Fabio Pierce, MD;  Location: [facility];  Service: Ophthalmology;  Laterality: Left;  CDE: 3.82   CATARACT EXTRACTION W/PHACO Right 05/03/2017   Procedure: CATARACT EXTRACTION PHACO AND INTRAOCULAR LENS PLACEMENT RIGHT EYE;  Surgeon: Fabio Pierce, MD;  Location: [facility];  Service: Ophthalmology;  Laterality: Right;  CDE: 3.89   MULTIPLE TOOTH EXTRACTIONS     TONSILLECTOMY       reports that she quit smoking about 5 months ago. Her smoking use included cigarettes. She started smoking about 62 years ago. She has a  25.00 pack-year smoking history. She has never used smokeless tobacco. She reports current alcohol use. She reports that she does not use drugs.  Allergies  Allergen Reactions   Penicillins Anaphylaxis, Swelling, Rash and Other (See Comments)    Has patient had a PCN reaction causing immediate rash,  facial/tongue/throat swelling, SOB or lightheadedness with hypotension: yes Has patient had a PCN reaction causing severe rash involving mucus membranes or skin necrosis: no Has patient had a PCN reaction that required hospitalization: yes Has patient had a PCN reaction occurring within the last 10 years: no If all of the above answers are "NO", then may proceed with Cephalosporin use.     Family History  Adopted: Yes  Problem Relation Age of Onset   Bipolar disorder Daughter    Heart disease Daughter    Asthma Daughter    Bipolar disorder Son    Bipolar disorder Daughter    Bipolar disorder Daughter    Bipolar disorder Daughter    Hypertension Daughter    Heart disease Daughter    Drug abuse Daughter        OD    Prior to Admission medications   Medication Sig Start Date End Date Taking? Authorizing Provider  albuterol (VENTOLIN HFA) 108 (90 Base) MCG/ACT inhaler Inhale 2 puffs into the lungs every 6 (six) hours as needed for wheezing or shortness of breath. 06/20/22   Mechele Claude, MD  amLODipine (NORVASC) 10 MG tablet Take 1 tablet (10 mg total) by mouth daily. 12/27/21   Mechele Claude, MD  arformoterol (BROVANA) 15 MCG/2ML NEBU Take 2 mLs (15 mcg total) by nebulization 2 (two) times daily. 03/27/22 05/26/22  Vassie Loll, MD  atorvastatin (LIPITOR) 80 MG tablet Take 1 tablet (80 mg total) by mouth daily. 04/18/22   Alver Sorrow, NP  budesonide (PULMICORT) 0.5 MG/2ML nebulizer solution Take 2 mLs (0.5 mg total) by nebulization 2 (two) times daily. 03/27/22 07/25/22  Vassie Loll, MD  budesonide-formoterol [facility]) 160-4.5 MCG/ACT inhaler Inhale 2 puffs into the lungs 2 (two) times daily. 06/20/22   Mechele Claude, MD  clopidogrel (PLAVIX) 75 MG tablet TAKE ONE TABLET ONCE DAILY 12/25/21   Daryll Drown, NP  cyclobenzaprine (FLEXERIL) 5 MG tablet TAKE ONE TABLET THREE TIMES DAILY AS NEEDED FOR MUSCLE SPASMS 07/18/22   Mechele Claude, MD  hydrALAZINE (APRESOLINE) 50 MG  tablet Take 1 tablet (50 mg total) by mouth 3 (three) times daily. 06/12/22   Chilton Si, MD  hydrochlorothiazide (MICROZIDE) 12.5 MG capsule Take 1 tablet by mouth in the morning along with Valsartan/Hydrochlorothiazide 160/12.5mg  daily 07/04/22   Chilton Si, MD  levalbuterol [facility] HFA) 45 MCG/ACT inhaler INHALE 2 PUFFS EVERY 4 HOURS AS NEEDED FOR WHEEZING OR SHORTNESS OF BREATH 06/19/22   [provider]  meloxicam (MOBIC) 15 MG tablet TAKE ONE TABLET ONCE DAILY 09/14/22   Mechele Claude, MD  pantoprazole (PROTONIX) 40 MG tablet Take 1 tablet (40 mg total) by mouth daily. 03/28/22   Vassie Loll, MD  revefenacin [facility]) 175 MCG/3ML nebulizer solution Take 3 mLs (175 mcg total) by nebulization daily. 07/25/22   Hunsucker, Lesia Sago, MD  valsartan-hydrochlorothiazide (DIOVAN HCT) 160-12.5 MG tablet Take 1 tablet by mouth daily. 04/03/22   Vassie Loll, MD    Physical Exam: Vitals:   09/16/22 1511 09/16/22 1515 09/16/22 1530 09/16/22 1540  BP: (!) 205/78 (!) 198/68 (!) 166/128 (!) 237/97  Pulse: 85 88 93 94  Resp:  19 (!) 21 (!) 21  Temp: 98.2 F (36.8  C)     TempSrc: Oral     SpO2: 98% 98% 96% 96%  Weight:      Height:        Constitutional: NAD, calm, comfortable Vitals:   09/16/22 1511 09/16/22 1515 09/16/22 1530 09/16/22 1540  BP: (!) 205/78 (!) 198/68 (!) 166/128 (!) 237/97  Pulse: 85 88 93 94  Resp:  19 (!) 21 (!) 21  Temp: 98.2 F (36.8 C)     TempSrc: Oral     SpO2: 98% 98% 96% 96%  Weight:      Height:       Eyes: PERRL, lids and conjunctivae normal ENMT: Mucous membranes are moist.  Neck: normal, supple, no masses, no thyromegaly Respiratory: clear to auscultation bilaterally, no wheezing, no crackles. Normal respiratory effort. No accessory muscle use.  Cardiovascular: Regular rate and rhythm, no murmurs / rubs / gallops. No extremity edema. 2+ pedal pulses. No carotid bruits.  Abdomen: no tenderness, no masses palpated. No  hepatosplenomegaly. Bowel sounds positive.  Musculoskeletal: no clubbing / cyanosis. No joint deformity upper and lower extremities.  Skin: no rashes, lesions, ulcers. No induration Neurologic: No facial asymmetry, 5/5 strength in bilat upper extremity, 4/5 strength bilat lower extremity, not able to lift LE against resistance, L slightly worse then right.  At baseline ambulates without assistive devices Psychiatric: Normal judgment and insight. Alert and oriented x 3. Normal mood.   Labs on Admission: I have personally reviewed following labs and imaging studies  CBC: Recent Labs  Lab 09/16/22 1214  WBC 4.6  HGB 14.7  HCT 45.6  MCV 95.2  PLT 157   Basic Metabolic Panel: Recent Labs  Lab 09/16/22 1214  NA 141  K 4.0  CL 102  CO2 30  GLUCOSE 93  BUN 17  CREATININE 0.99  CALCIUM 8.9   Radiological Exams on Admission: CT Angio Chest/Abd/Pel for Dissection W and/or W/WO  Result Date: 09/16/2022 CLINICAL DATA:  Chest pain and epigastric pain. Extending to the left arm. Hypertension EXAM: CT ANGIOGRAPHY CHEST, ABDOMEN AND PELVIS TECHNIQUE: Non-contrast CT of the chest was initially obtained. Multidetector CT imaging through the chest, abdomen and pelvis was performed using the standard protocol during bolus administration of intravenous contrast. Multiplanar reconstructed images and MIPs were obtained and reviewed to evaluate the vascular anatomy. RADIATION DOSE REDUCTION: This exam was performed according to the departmental dose-optimization program which includes automated exposure control, adjustment of the mA and/or kV according to patient size and/or use of iterative reconstruction technique. CONTRAST:  OMNIPAQUE IOHEXOL 350 MG/ML SOLN COMPARISON:  CT angiogram chest 05/15/2021 and older. X-ray earlier 09/16/2022 and older FINDINGS: CTA CHEST FINDINGS Cardiovascular: There is mild-to-moderate partially calcified atherosclerotic plaque along the thoracic aorta. No increased  density on noncontrast imaging to suggest an intramural hematoma. Small saccular outpouching extending to the left from the aortic arch. Penetrating ulcer or irregular plaque. Otherwise no dissection or aneurysm formation. Diameter of the aorta just above the aortic root up to 2.7 cm. Aortic arch has a diameter approaching 2.6 cm. The ascending aorta at the level of the right pulmonary artery measures 2.9 x 3.0 cm in the descending thoracic aorta at the same level measures 2.5 x 2.5 cm. The heart is nonenlarged. No significant pericardial effusion. Coronary artery calcifications are seen. Mediastinum/Nodes: Slightly patulous thoracic esophagus. Preserved thyroid gland. No specific abnormal lymph node enlargement identified in the axillary regions, hilum or mediastinum. Lungs/Pleura: There is some linear opacity lung bases likely scar or atelectasis. No  consolidation, pneumothorax or effusion. 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 Musculoskeletal: Fixation hardware seen along the lower cervical spine the edge of the imaging field. Scattered degenerative changes of the thoracic spine. Global osteopenia. Review of the MIP images confirms the above findings. CTA ABDOMEN AND PELVIS FINDINGS VASCULAR Aorta: Moderate calcified plaque identified along the abdominal aorta without dissection or aneurysm formation. Celiac: Mild plaque along the celiac.  Standard branching pattern. SMA: Patent SMA origin. Renals: There is some atherosclerotic plaque along the origin of the renal arteries. Moderate stenosis on the left at the origin. There is moderate atrophy of the left kidney. IMA: Focally occluded. Inflow: Mild-to-moderate atherosclerotic partially calcified plaque along the common iliac arteries. Only mild areas of stenosis, less than 50%. Less significant disease along the external iliac arteries. Moderate along the internal iliac with areas of significant ostial stenosis. Veins: No obvious venous  abnormality within the limitations of this arterial phase study. Review of the MIP images confirms the above findings. NON-VASCULAR Hepatobiliary: With the limits of the arterial phase of the contrast bolus, the liver has some small low-attenuation lesions consistent with benign cysts. Example right hepatic lobe segment 7 measuring 13 mm in diameter with Hounsfield unit of 14. No specific imaging follow-up. Portal vein is patent. Gallbladder is mildly distended. Pancreas: Unremarkable. No pancreatic ductal dilatation or surrounding inflammatory changes. Spleen: Normal in size without focal abnormality. Adrenals/Urinary Tract: The adrenal glands are preserved. There is moderate atrophy of the left kidney and mild right. Small cystic foci are noted along each kidney, Bosniak 1 and 2 lesions. No specific imaging follow up Stomach/Bowel: There is fluid along the nondilated stomach. Small and large bowel are nondilated. Scattered colonic stool. Overall moderate stool burden. Few colonic diverticula. Normal retrocecal appendix in the right hemipelvis Lymphatic: No developing abnormal lymph node enlargement identified in the abdomen and pelvis. Reproductive: Prostate is unremarkable. Other: Mild anasarca.  No free air or free fluid. Musculoskeletal: Scattered degenerative changes of the spine and pelvis. Review of the MIP images confirms the above findings. IMPRESSION: Moderate partially calcified atherosclerotic plaque overall. No dissection or aneurysm formation. Focal stenosis seen of the left renal artery with moderate atrophy of the left kidney. Please correlate for level of hypertension. Occluded inferior mesenteric artery. Colonic diverticula.  Moderate colonic stool. No obstruction, free air or free fluid. Centrilobular emphysematous lung changes. Electronically Signed   By: Karen Kays M.D.   On: 09/16/2022 15:07   DG Chest Port 1 View  Result Date: 09/16/2022 CLINICAL DATA:  Chest pain. EXAM: PORTABLE CHEST  1 VIEW COMPARISON:  One-view chest x-ray 03/25/2022 FINDINGS: The heart size is normal. Atherosclerotic calcifications are present at the aortic arch. And chronic bronchitic changes are most evident the lung apices. No superimposed acute disease is present. No edema or effusion is present. No focal airspace disease is present. IMPRESSION: 1. No acute cardiopulmonary disease. 2. Chronic bronchitic changes. Electronically Signed   By: Marin Roberts M.D.   On: 09/16/2022 12:22    EKG: Independently reviewed. Sinus rate 70, QTc 501. No significant ST or T wave change.    Assessment/Plan Principal Problem:   Hypertensive urgency Active Problems:   Atypical chest pain   Left renal artery stenosis   COPD with chronic bronchitis   GAD (generalized anxiety disorder)   GERD (gastroesophageal reflux disease)  Assessment and Plan: * Hypertensive urgency Blood pressure systolic up to 237.  Reports atypical chest pain.  Reports compliance with all hypertensives hydralazine 50 -3 times  daily, valsartan HCTZ 160-0.5, and Norvasc 10 mg. - 15mg  hydralazine given in ED without improvement in blood pressure, subsequently started on nicardipine drip -Target blood pressure systolic 160s to 161W -Wean off nicardipine drip -Hold valsartan HCTZ for contrast exposure, resume other 2 home antihypertensives  Atypical chest pain Atypical chest pains.  Troponins 11 > 10.  EKG unchanged.  Heart CT coronary 03/30/22-diagnosed with moderate atherosclerosis, symptom guided anti-ischemic pharmacotherapy and risk factor modification recommended.  Subsequent coronary CT FFR analysis demonstrated no significant flow-limiting lesions.  Follows with cardiology. -Resume statins, Plavix -CTA chest negative for PE  Left renal artery stenosis CTA chest abdomen and pelvis - focal stenosis seen of the left renal artery with moderate atrophy of the left kidney, occluded inferior mesenteric artery. - EDP spoke with the vascular  surgeon on-call Dr. Chestine Spore - there is good flow in the celiac trunk and the SMA and the patient would not require aggressive intervention for the IMA.  In regards to the renal artery stenosis, this can be followed as outpatient,  But is likely driving some of this hypertension.    GERD (gastroesophageal reflux disease) Resume Protonix  COPD with chronic bronchitis Stable. -Resume home regimen   DVT prophylaxis: Lovenox Code Status: Full Code-confirmed with patient at bedside.  ACP documents reviewed, showed DNR form from 01/21/2019, patient tells me this is no longer valid, she wants to be full code, and asked to have DNR form removed.  I have followed her wishes and removed the document. Family Communication: None at bedside Disposition Plan: ~ 2 days Consults called: None Admission status: Inpatient, stepdown I certify that at the point of admission it is my clinical judgment that the patient will require inpatient hospital care spanning beyond 2 midnights from the point of admission due to high intensity of service, high risk for further deterioration and high frequency of surveillance required.   Author: Onnie Boer, MD 09/16/2022 5:40 PM  For on call review [email or web address].

## 2022-09-16 NOTE — Assessment & Plan Note (Addendum)
CTA chest abdomen and pelvis - focal stenosis seen of the left renal artery with moderate atrophy of the left kidney, occluded inferior mesenteric artery. - EDP spoke with the vascular surgeon on-call Dr. Chestine Spore - there is good flow in the celiac trunk and the SMA and the patient would not require aggressive intervention for the IMA.  In regards to the renal artery stenosis, this can be followed as outpatient,  But is likely driving some of this hypertension.

## 2022-09-16 NOTE — ED Provider Notes (Signed)
[place] EMERGENCY DEPARTMENT AT [facility] Provider Note   CSN: [id number] Arrival date & time: 09/16/22  1148     History  Chief Complaint  Patient presents with   Chest Pain    [name] is a 82 y.o. female.  She has a prior history of stroke and hypertension.  She said she was cooking this morning around 930 when she began experiencing some substernal chest pain that radiated to her neck and her abdomen.  She rated as 10 out of 10, currently 3 out of 10.  Not associated with any shortness of breath diaphoresis nausea vomiting.  She did say she felt a little dizzy.  She checked her blood pressure and found it to be high so called the ambulance.  She had taken her blood pressure medication this morning.  She said she has had this before but never this intense.  The history is provided by the patient.  Chest Pain Pain location:  Substernal area Pain quality: aching   Pain radiates to:  Neck and epigastrium Pain severity:  Severe Onset quality:  Sudden Duration:  3 hours Timing:  Constant Progression:  Improving Chronicity:  Recurrent Relieved by:  None tried Worsened by:  Nothing Ineffective treatments:  None tried Associated symptoms: dizziness   Associated symptoms: no back pain, no cough, no diaphoresis, no fever, no nausea, no shortness of breath, no syncope and no vomiting   Risk factors: hypertension        Home Medications Prior to Admission medications   Medication Sig Start Date End Date Taking? Authorizing Provider  albuterol (VENTOLIN HFA) 108 (90 Base) MCG/ACT inhaler Inhale 2 puffs into the lungs every 6 (six) hours as needed for wheezing or shortness of breath. 06/20/22   Mechele Claude, MD  amLODipine (NORVASC) 10 MG tablet Take 1 tablet (10 mg total) by mouth daily. 12/27/21   Mechele Claude, MD  arformoterol (BROVANA) 15 MCG/2ML NEBU Take 2 mLs (15 mcg total) by nebulization 2 (two) times daily. 03/27/22 05/26/22  Vassie Loll, MD   atorvastatin (LIPITOR) 80 MG tablet Take 1 tablet (80 mg total) by mouth daily. 04/18/22   Alver Sorrow, NP  budesonide (PULMICORT) 0.5 MG/2ML nebulizer solution Take 2 mLs (0.5 mg total) by nebulization 2 (two) times daily. 03/27/22 07/25/22  Vassie Loll, MD  budesonide-formoterol [facility]) 160-4.5 MCG/ACT inhaler Inhale 2 puffs into the lungs 2 (two) times daily. 06/20/22   Mechele Claude, MD  clopidogrel (PLAVIX) 75 MG tablet TAKE ONE TABLET ONCE DAILY 12/25/21   Daryll Drown, NP  cyclobenzaprine (FLEXERIL) 5 MG tablet TAKE ONE TABLET THREE TIMES DAILY AS NEEDED FOR MUSCLE SPASMS 07/18/22   Mechele Claude, MD  hydrALAZINE (APRESOLINE) 50 MG tablet Take 1 tablet (50 mg total) by mouth 3 (three) times daily. 06/12/22   Chilton Si, MD  hydrochlorothiazide (MICROZIDE) 12.5 MG capsule Take 1 tablet by mouth in the morning along with Valsartan/Hydrochlorothiazide 160/12.5mg  daily 07/04/22   Chilton Si, MD  levalbuterol [facility] HFA) 45 MCG/ACT inhaler INHALE 2 PUFFS EVERY 4 HOURS AS NEEDED FOR WHEEZING OR SHORTNESS OF BREATH 06/19/22   [provider]  meloxicam (MOBIC) 15 MG tablet TAKE ONE TABLET ONCE DAILY 09/14/22   Mechele Claude, MD  pantoprazole (PROTONIX) 40 MG tablet Take 1 tablet (40 mg total) by mouth daily. 03/28/22   Vassie Loll, MD  revefenacin [facility]) 175 MCG/3ML nebulizer solution Take 3 mLs (175 mcg total) by nebulization daily. 07/25/22   Hunsucker, Lesia Sago, MD  valsartan-hydrochlorothiazide (DIOVAN HCT) 160-12.5 MG tablet Take 1 tablet by mouth daily. 04/03/22   Vassie Loll, MD      Allergies    Penicillins    Review of Systems   Review of Systems  Constitutional:  Negative for diaphoresis and fever.  Eyes:  Positive for visual disturbance (ongoing problem - sees eye doctor).  Respiratory:  Negative for cough and shortness of breath.   Cardiovascular:  Positive for chest pain. Negative for syncope.  Gastrointestinal:  Negative for nausea and  vomiting.  Musculoskeletal:  Negative for back pain.  Neurological:  Positive for dizziness.    Physical Exam Updated Vital Signs BP (!) 207/102   Pulse 69   Temp 98.1 F (36.7 C) (Oral)   Resp 19   Ht  (1.549 m)   Wt 63.5 kg   SpO2 96%   BMI 26.45 kg/m  Physical Exam Vitals and nursing note reviewed.  Constitutional:      General: She is not in acute distress.    Appearance: Normal appearance. She is well-developed.  HENT:     Head: Normocephalic and atraumatic.  Eyes:     Conjunctiva/sclera: Conjunctivae normal.  Cardiovascular:     Rate and Rhythm: Normal rate and regular rhythm.     Heart sounds: Normal heart sounds. No murmur heard. Pulmonary:     Effort: Pulmonary effort is normal. No respiratory distress.     Breath sounds: Normal breath sounds.  Abdominal:     Palpations: Abdomen is soft.     Tenderness: There is no abdominal tenderness.  Musculoskeletal:        General: No swelling. Normal range of motion.     Cervical back: Neck supple.     Right lower leg: No tenderness. No edema.     Left lower leg: No tenderness. No edema.  Skin:    General: Skin is warm and dry.     Capillary Refill: Capillary refill takes less than 2 seconds.  Neurological:     Mental Status: She is alert. Mental status is at baseline.     ED Results / Procedures / Treatments   Labs (all labs ordered are listed, but only abnormal results are displayed) Labs Reviewed  BASIC METABOLIC PANEL - Abnormal; Notable for the following components:      Result Value   GFR, Estimated 58 (*)    All other components within normal limits  CBC  MAGNESIUM  TROPONIN I (HIGH SENSITIVITY)  TROPONIN I (HIGH SENSITIVITY)    EKG EKG Interpretation  Date/Time:  Sunday September 16 2022 12:25:33 EDT Ventricular Rate:  70 PR Interval:  152 QRS Duration: 92 QT Interval:  464 QTC Calculation: 501 R Axis:   -37 Text Interpretation: Sinus rhythm Probable left atrial enlargement Abnormal  R-wave progression, early transition Left ventricular hypertrophy Prolonged QT interval No significant change since prior 10/23 Confirmed by Meridee Score [phone]) on 09/16/2022 12:27:54 PM  Radiology CT Angio Chest/Abd/Pel for Dissection W and/or W/WO  Result Date: 09/16/2022 CLINICAL DATA:  Chest pain and epigastric pain. Extending to the left arm. Hypertension EXAM: CT ANGIOGRAPHY CHEST, ABDOMEN AND PELVIS TECHNIQUE: Non-contrast CT of the chest was initially obtained. Multidetector CT imaging through the chest, abdomen and pelvis was performed using the standard protocol during bolus administration of intravenous contrast. Multiplanar reconstructed images and MIPs were obtained and reviewed to evaluate the vascular anatomy. RADIATION DOSE REDUCTION: This exam was performed according to the departmental dose-optimization program which includes automated exposure control, adjustment of  the mA and/or kV according to patient size and/or use of iterative reconstruction technique. CONTRAST:  OMNIPAQUE IOHEXOL 350 MG/ML SOLN COMPARISON:  CT angiogram chest 05/15/2021 and older. X-ray earlier 09/16/2022 and older FINDINGS: CTA CHEST FINDINGS Cardiovascular: There is mild-to-moderate partially calcified atherosclerotic plaque along the thoracic aorta. No increased density on noncontrast imaging to suggest an intramural hematoma. Small saccular outpouching extending to the left from the aortic arch. Penetrating ulcer or irregular plaque. Otherwise no dissection or aneurysm formation. Diameter of the aorta just above the aortic root up to 2.7 cm. Aortic arch has a diameter approaching 2.6 cm. The ascending aorta at the level of the right pulmonary artery measures 2.9 x 3.0 cm in the descending thoracic aorta at the same level measures 2.5 x 2.5 cm. The heart is nonenlarged. No significant pericardial effusion. Coronary artery calcifications are seen. Mediastinum/Nodes: Slightly patulous thoracic esophagus.  Preserved thyroid gland. No specific abnormal lymph node enlargement identified in the axillary regions, hilum or mediastinum. Lungs/Pleura: There is some linear opacity lung bases likely scar or atelectasis. No consolidation, pneumothorax or effusion. 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 Musculoskeletal: Fixation hardware seen along the lower cervical spine the edge of the imaging field. Scattered degenerative changes of the thoracic spine. Global osteopenia. Review of the MIP images confirms the above findings. CTA ABDOMEN AND PELVIS FINDINGS VASCULAR Aorta: Moderate calcified plaque identified along the abdominal aorta without dissection or aneurysm formation. Celiac: Mild plaque along the celiac.  Standard branching pattern. SMA: Patent SMA origin. Renals: There is some atherosclerotic plaque along the origin of the renal arteries. Moderate stenosis on the left at the origin. There is moderate atrophy of the left kidney. IMA: Focally occluded. Inflow: Mild-to-moderate atherosclerotic partially calcified plaque along the common iliac arteries. Only mild areas of stenosis, less than 50%. Less significant disease along the external iliac arteries. Moderate along the internal iliac with areas of significant ostial stenosis. Veins: No obvious venous abnormality within the limitations of this arterial phase study. Review of the MIP images confirms the above findings. NON-VASCULAR Hepatobiliary: With the limits of the arterial phase of the contrast bolus, the liver has some small low-attenuation lesions consistent with benign cysts. Example right hepatic lobe segment 7 measuring 13 mm in diameter with Hounsfield unit of 14. No specific imaging follow-up. Portal vein is patent. Gallbladder is mildly distended. Pancreas: Unremarkable. No pancreatic ductal dilatation or surrounding inflammatory changes. Spleen: Normal in size without focal abnormality. Adrenals/Urinary Tract: The adrenal glands are  preserved. There is moderate atrophy of the left kidney and mild right. Small cystic foci are noted along each kidney, Bosniak 1 and 2 lesions. No specific imaging follow up Stomach/Bowel: There is fluid along the nondilated stomach. Small and large bowel are nondilated. Scattered colonic stool. Overall moderate stool burden. Few colonic diverticula. Normal retrocecal appendix in the right hemipelvis Lymphatic: No developing abnormal lymph node enlargement identified in the abdomen and pelvis. Reproductive: Prostate is unremarkable. Other: Mild anasarca.  No free air or free fluid. Musculoskeletal: Scattered degenerative changes of the spine and pelvis. Review of the MIP images confirms the above findings. IMPRESSION: Moderate partially calcified atherosclerotic plaque overall. No dissection or aneurysm formation. Focal stenosis seen of the left renal artery with moderate atrophy of the left kidney. Please correlate for level of hypertension. Occluded inferior mesenteric artery. Colonic diverticula.  Moderate colonic stool. No obstruction, free air or free fluid. Centrilobular emphysematous lung changes. Electronically Signed   By: Piedad Climes.D.  On: 09/16/2022 15:07   DG Chest Port 1 View  Result Date: 09/16/2022 CLINICAL DATA:  Chest pain. EXAM: PORTABLE CHEST 1 VIEW COMPARISON:  One-view chest x-ray 03/25/2022 FINDINGS: The heart size is normal. Atherosclerotic calcifications are present at the aortic arch. And chronic bronchitic changes are most evident the lung apices. No superimposed acute disease is present. No edema or effusion is present. No focal airspace disease is present. IMPRESSION: 1. No acute cardiopulmonary disease. 2. Chronic bronchitic changes. Electronically Signed   By: Marin Roberts M.D.   On: 09/16/2022 12:22    Procedures .Critical Care  Performed by: Terrilee Files, MD Authorized by: Terrilee Files, MD   Critical care provider statement:    Critical care time  (minutes):  45   Critical care time was exclusive of:  Separately billable procedures and treating other patients   Critical care was necessary to treat or prevent imminent or life-threatening deterioration of the following conditions:  Cardiac failure and circulatory failure   Critical care was time spent personally by me on the following activities:  Development of treatment plan with patient or surrogate, discussions with consultants, evaluation of patient's response to treatment, examination of patient, obtaining history from patient or surrogate, ordering and performing treatments and interventions, ordering and review of laboratory studies, ordering and review of radiographic studies, pulse oximetry, re-evaluation of patient's condition and review of old charts   I assumed direction of critical care for this patient from another provider in my specialty: no       Medications Ordered in ED Medications  nicardipine (CARDENE) 20mg  in 0.86% saline IV infusion (0.1 mg/ml) (2.5 mg/hr Intravenous Rate/Dose Change 09/16/22 1640)  hydrALAZINE (APRESOLINE) injection 5 mg (5 mg Intravenous Given 09/16/22 1239)  hydrALAZINE (APRESOLINE) injection 10 mg (10 mg Intravenous Given 09/16/22 1329)  nitroGLYCERIN (NITROGLYN) 2 % ointment 1 inch (1 inch Topical Given 09/16/22 1509)  iohexol (OMNIPAQUE) 350 MG/ML injection 100 mL (100 mLs Intravenous Contrast Given 09/16/22 1448)    ED Course/ Medical Decision Making/ A&P Clinical Course as of 09/16/22 1731  Sun Sep 16, 2022  1227 Chest x-ray interpreted by me as no acute infiltrate.  Awaiting radiology reading. [MB]  1243 Patient had taken aspirin already this morning and EMS gave nitro.  [MB]    Clinical Course User Index [MB] Terrilee Files, MD                             Medical Decision Making Amount and/or Complexity of Data Reviewed Labs: ordered. Radiology: ordered.  Risk Prescription drug management. Decision regarding  hospitalization.   This patient complains of chest pain, elevated blood pressure; this involves an extensive number of treatment Options and is a complaint that carries with it a high risk of complications and morbidity. The differential includes ACS, dissection, aneurysm, PE, hypertensive emergency  I ordered, reviewed and interpreted labs, which included CBC with normal white count normal hemoglobin, chemistries normal, troponins flat I ordered medication IV hydralazine topical nitro and reviewed PMP when indicated. I ordered imaging studies which included chest x-ray, CT angio chest abdomen pelvis and I independently    visualized and interpreted imaging which showed no evidence of dissection, does have significant renal artery stenosis Previous records obtained and reviewed in epic including recent PCP notes Cardiac monitoring reviewed, normal sinus rhythm/sinus tachycardia Social determinants considered, no significant barriers Critical Interventions: Initiation of IV medications for blood pressure control  After the interventions stated above, I reevaluated the patient and found patient to be fairly asymptomatic and chest pain improving Admission and further testing considered, her care is signed out to Dr. Posey Rea to follow-up on final reading of CAT scan and likely may need additional blood pressure control.  Anticipate will need admission if blood pressure not improving.         Final Clinical Impression(s) / ED Diagnoses Final diagnoses:  Hypertensive emergency    Rx / DC Orders ED Discharge Orders     None         Terrilee Files, MD 09/16/22 1734

## 2022-09-16 NOTE — Assessment & Plan Note (Addendum)
Atypical chest pains.  Troponins 11 > 10.  EKG unchanged.  Heart CT coronary 03/30/22-diagnosed with moderate atherosclerosis, symptom guided anti-ischemic pharmacotherapy and risk factor modification recommended.  Subsequent coronary CT FFR analysis demonstrated no significant flow-limiting lesions.  Follows with cardiology. -Resume statins, Plavix -CTA chest negative for PE

## 2022-09-16 NOTE — ED Notes (Addendum)
ED TO INPATIENT HANDOFF REPORT  ED Nurse Name and Phone #: [phone]  S Name/Age/Gender [name] 82 y.o. female Room/Bed: APA14/APA14  Code Status   Code Status: Full Code  Home/SNF/Other Home Patient oriented to: self, place, time, and situation Is this baseline? Yes   Triage Complete: Triage complete  Chief Complaint Hypertensive urgency [I16.0]  Triage Note Pt c/o chest pain this morning , epigastric area and some slightly to left arm. No n/v or sweating noted. REMS gave 4 mg of nitro for high bp. BP was 23/ and went down to 180 systolic. Pt ambulated to EMS. 20 R AC   Allergies Allergies  Allergen Reactions   Penicillins Anaphylaxis, Swelling, Rash and Other (See Comments)    Has patient had a PCN reaction causing immediate rash, facial/tongue/throat swelling, SOB or lightheadedness with hypotension: yes Has patient had a PCN reaction causing severe rash involving mucus membranes or skin necrosis: no Has patient had a PCN reaction that required hospitalization: yes Has patient had a PCN reaction occurring within the last 10 years: no If all of the above answers are "NO", then may proceed with Cephalosporin use.     Level of Care/Admitting Diagnosis ED Disposition     ED Disposition  Admit   Condition  --   Comment  Hospital Area: [facility] [[id number]]  Level of Care: Telemetry [5]  Covid Evaluation: Asymptomatic - no recent exposure (last 10 days) testing not required  Diagnosis: Hypertensive urgency [960454]  Admitting Physician: Onnie Boer [[id number]]  Attending Physician: Onnie Boer [phone]  Certification:: I certify this patient will need inpatient services for at least 2 midnights          B Medical/Surgery History Past Medical History:  Diagnosis Date   Arthritis    OA   Atypical chest pain 03/19/2022   GERD (gastroesophageal reflux disease)    Headache(784.0)    Hepatitis    history of Hepatitis 20 years ago;  not sure what kind   History of gout    Hypertension    Hypertensive encephalopathy    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   Stroke (HCC)    SLIGHT RT SIDE WEAKNESS 2001   Unintentional weight loss 03/19/2022   Past Surgical History:  Procedure Laterality Date   ABDOMINAL HYSTERECTOMY     ANTERIOR CERVICAL CORPECTOMY  03/07/2012   Procedure: ANTERIOR CERVICAL CORPECTOMY;  Surgeon: Barnett Abu, MD;  Location: [facility];  Service: Neurosurgery;  Laterality: N/A;  Cervical six-seven, cervical seven-thoracic one Anterior cervical decompression/diskectomy/fusion, with Cervical seven Corpectomy, reconstruction using Allograft and Alphatec plate   CATARACT EXTRACTION W/PHACO Left 04/12/2017   Procedure: CATARACT EXTRACTION PHACO AND INTRAOCULAR LENS PLACEMENT (IOC);  Surgeon: Fabio Pierce, MD;  Location: [facility];  Service: Ophthalmology;  Laterality: Left;  CDE: 3.82   CATARACT EXTRACTION W/PHACO Right 05/03/2017   Procedure: CATARACT EXTRACTION PHACO AND INTRAOCULAR LENS PLACEMENT RIGHT EYE;  Surgeon: Fabio Pierce, MD;  Location: [facility];  Service: Ophthalmology;  Laterality: Right;  CDE: 3.89   MULTIPLE TOOTH EXTRACTIONS     TONSILLECTOMY       A IV Location/Drains/Wounds Patient Lines/Drains/Airways Status     Active Line/Drains/Airways     Name Placement date Placement time Site Days   Peripheral IV 09/16/22 Anterior;Distal;Right;Upper Arm 09/16/22  1213  Arm  less than 1   External Urinary Catheter 09/16/22  1208  --  less than 1  Intake/Output Last 24 hours  Intake/Output Summary (Last 24 hours) at 09/16/2022 2316 Last data filed at 09/16/2022 1602 Gross per 24 hour  Intake 15.07 ml  Output --  Net 15.07 ml    Labs/Imaging Results for orders placed or performed during the hospital encounter of 09/16/22 (from the past 48 hour(s))  Basic metabolic panel     Status: Abnormal   Collection Time: 09/16/22 12:14 PM  Result Value Ref Range    Sodium 141 135 - 145 mmol/L   Potassium 4.0 3.5 - 5.1 mmol/L   Chloride 102 98 - 111 mmol/L   CO2 30 22 - 32 mmol/L   Glucose, Bld 93 70 - 99 mg/dL    Comment: Glucose reference range applies only to samples taken after fasting for at least 8 hours.   BUN 17 8 - 23 mg/dL   Creatinine, Ser 4.09 0.44 - 1.00 mg/dL   Calcium 8.9 8.9 - 81.1 mg/dL   GFR, Estimated 58 (L) >60 mL/min    Comment: (NOTE) Calculated using the CKD-EPI Creatinine Equation (2021)    Anion gap 9 5 - 15    Comment: Performed at [facility], [address] [place], [place] [address]  CBC     Status: None   Collection Time: 09/16/22 12:14 PM  Result Value Ref Range   WBC 4.6 4.0 - 10.5 K/uL   RBC 4.79 3.87 - 5.11 MIL/uL   Hemoglobin 14.7 12.0 - 15.0 g/dL   HCT 29.5 62.1 - 30.8 %   MCV 95.2 80.0 - 100.0 fL   MCH 30.7 26.0 - 34.0 pg   MCHC 32.2 30.0 - 36.0 g/dL   RDW 65.7 84.6 - 96.2 %   Platelets 157 150 - 400 K/uL   nRBC 0.0 0.0 - 0.2 %    Comment: Performed at [facility], [address] [place], [place] [address]  Troponin I (High Sensitivity)     Status: None   Collection Time: 09/16/22 12:14 PM  Result Value Ref Range   Troponin I (High Sensitivity) 11 <18 ng/L    Comment: (NOTE) Elevated high sensitivity troponin I (hsTnI) values and significant  changes across serial measurements may suggest ACS but many other  chronic and acute conditions are known to elevate hsTnI results.  Refer to the "Links" section for chest pain algorithms and additional  guidance. Performed at [facility], [address] [place], [place] [address]   Magnesium     Status: None   Collection Time: 09/16/22 12:14 PM  Result Value Ref Range   Magnesium 2.3 1.7 - 2.4 mg/dL    Comment: Performed at [facility], [address] [place], [place] [address]  Troponin I (High Sensitivity)     Status: None   Collection Time: 09/16/22  3:05 PM  Result Value Ref Range   Troponin I (High Sensitivity) 13 <18 ng/L    Comment:  (NOTE) Elevated high sensitivity troponin I (hsTnI) values and significant  changes across serial measurements may suggest ACS but many other  chronic and acute conditions are known to elevate hsTnI results.  Refer to the "Links" section for chest pain algorithms and additional  guidance. Performed at [facility], [address], [place], [place] [address]    CT Angio Chest/Abd/Pel for Dissection W and/or W/WO  Result Date: 09/16/2022 CLINICAL DATA:  Chest pain and epigastric pain. Extending to the left arm. Hypertension EXAM: CT ANGIOGRAPHY CHEST, ABDOMEN AND PELVIS TECHNIQUE: Non-contrast CT of the chest was initially obtained. Multidetector  CT imaging through the chest, abdomen and pelvis was performed using the standard protocol during bolus administration of intravenous contrast. Multiplanar reconstructed images and MIPs were obtained and reviewed to evaluate the vascular anatomy. RADIATION DOSE REDUCTION: This exam was performed according to the departmental dose-optimization program which includes automated exposure control, adjustment of the mA and/or kV according to patient size and/or use of iterative reconstruction technique. CONTRAST:  OMNIPAQUE IOHEXOL 350 MG/ML SOLN COMPARISON:  CT angiogram chest 05/15/2021 and older. X-ray earlier 09/16/2022 and older FINDINGS: CTA CHEST FINDINGS Cardiovascular: There is mild-to-moderate partially calcified atherosclerotic plaque along the thoracic aorta. No increased density on noncontrast imaging to suggest an intramural hematoma. Small saccular outpouching extending to the left from the aortic arch. Penetrating ulcer or irregular plaque. Otherwise no dissection or aneurysm formation. Diameter of the aorta just above the aortic root up to 2.7 cm. Aortic arch has a diameter approaching 2.6 cm. The ascending aorta at the level of the right pulmonary artery measures 2.9 x 3.0 cm in the descending thoracic aorta at the same level measures 2.5 x 2.5 cm.  The heart is nonenlarged. No significant pericardial effusion. Coronary artery calcifications are seen. Mediastinum/Nodes: Slightly patulous thoracic esophagus. Preserved thyroid gland. No specific abnormal lymph node enlargement identified in the axillary regions, hilum or mediastinum. Lungs/Pleura: There is some linear opacity lung bases likely scar or atelectasis. No consolidation, pneumothorax or effusion. 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 Musculoskeletal: Fixation hardware seen along the lower cervical spine the edge of the imaging field. Scattered degenerative changes of the thoracic spine. Global osteopenia. Review of the MIP images confirms the above findings. CTA ABDOMEN AND PELVIS FINDINGS VASCULAR Aorta: Moderate calcified plaque identified along the abdominal aorta without dissection or aneurysm formation. Celiac: Mild plaque along the celiac.  Standard branching pattern. SMA: Patent SMA origin. Renals: There is some atherosclerotic plaque along the origin of the renal arteries. Moderate stenosis on the left at the origin. There is moderate atrophy of the left kidney. IMA: Focally occluded. Inflow: Mild-to-moderate atherosclerotic partially calcified plaque along the common iliac arteries. Only mild areas of stenosis, less than 50%. Less significant disease along the external iliac arteries. Moderate along the internal iliac with areas of significant ostial stenosis. Veins: No obvious venous abnormality within the limitations of this arterial phase study. Review of the MIP images confirms the above findings. NON-VASCULAR Hepatobiliary: With the limits of the arterial phase of the contrast bolus, the liver has some small low-attenuation lesions consistent with benign cysts. Example right hepatic lobe segment 7 measuring 13 mm in diameter with Hounsfield unit of 14. No specific imaging follow-up. Portal vein is patent. Gallbladder is mildly distended. Pancreas: Unremarkable. No  pancreatic ductal dilatation or surrounding inflammatory changes. Spleen: Normal in size without focal abnormality. Adrenals/Urinary Tract: The adrenal glands are preserved. There is moderate atrophy of the left kidney and mild right. Small cystic foci are noted along each kidney, Bosniak 1 and 2 lesions. No specific imaging follow up Stomach/Bowel: There is fluid along the nondilated stomach. Small and large bowel are nondilated. Scattered colonic stool. Overall moderate stool burden. Few colonic diverticula. Normal retrocecal appendix in the right hemipelvis Lymphatic: No developing abnormal lymph node enlargement identified in the abdomen and pelvis. Reproductive: Prostate is unremarkable. Other: Mild anasarca.  No free air or free fluid. Musculoskeletal: Scattered degenerative changes of the spine and pelvis. Review of the MIP images confirms the above findings. IMPRESSION: Moderate partially calcified atherosclerotic plaque overall. No dissection or aneurysm  formation. Focal stenosis seen of the left renal artery with moderate atrophy of the left kidney. Please correlate for level of hypertension. Occluded inferior mesenteric artery. Colonic diverticula.  Moderate colonic stool. No obstruction, free air or free fluid. Centrilobular emphysematous lung changes. Electronically Signed   By: Karen Kays M.D.   On: 09/16/2022 15:07   DG Chest Port 1 View  Result Date: 09/16/2022 CLINICAL DATA:  Chest pain. EXAM: PORTABLE CHEST 1 VIEW COMPARISON:  One-view chest x-ray 03/25/2022 FINDINGS: The heart size is normal. Atherosclerotic calcifications are present at the aortic arch. And chronic bronchitic changes are most evident the lung apices. No superimposed acute disease is present. No edema or effusion is present. No focal airspace disease is present. IMPRESSION: 1. No acute cardiopulmonary disease. 2. Chronic bronchitic changes. Electronically Signed   By: Marin Roberts M.D.   On: 09/16/2022 12:22     Pending Labs Unresulted Labs (From admission, onward)     Start     Ordered   09/17/22 0500  CBC  Tomorrow morning,   R        09/16/22 2312   09/17/22 0500  Basic metabolic panel  Tomorrow morning,   R        09/16/22 2312            Vitals/Pain Today's Vitals   09/16/22 2100 09/16/22 2115 09/16/22 2130 09/16/22 2200  BP: (!) 144/83 (!) 169/70  (!) 181/75  Pulse: 98 89 89 94  Resp: (!) (!) 21  Temp:      TempSrc:      SpO2: 96% 95% 95% 97%  Weight:      Height:      PainSc:        Isolation Precautions No active isolations  Medications Medications  nicardipine (CARDENE)  in 0.86% saline IV infusion (0.1 mg/ml) (0 mg/hr Intravenous Stopped 09/16/22 1802)  enoxaparin (LOVENOX) injection 40 mg (has no administration in time range)  acetaminophen (TYLENOL) tablet 650 mg (has no administration in time range)    Or  acetaminophen (TYLENOL) suppository 650 mg (has no administration in time range)  polyethylene glycol (MIRALAX / GLYCOLAX) packet 17 g (has no administration in time range)  amLODipine (NORVASC) tablet 10 mg (has no administration in time range)  arformoterol (BROVANA) nebulizer solution 15 mcg (has no administration in time range)  atorvastatin (LIPITOR) tablet 80 mg (has no administration in time range)  mometasone-formoterol (DULERA) 200-5 MCG/ACT inhaler 2 puff (has no administration in time range)  clopidogrel (PLAVIX) tablet 75 mg (has no administration in time range)  cyclobenzaprine (FLEXERIL) tablet 5 mg (has no administration in time range)  hydrALAZINE (APRESOLINE) tablet 50 mg (50 mg Oral Given 09/16/22 2118)  hydrALAZINE (APRESOLINE) injection 5 mg (5 mg Intravenous Given 09/16/22 1239)  hydrALAZINE (APRESOLINE) injection 10 mg (10 mg Intravenous Given 09/16/22 1329)  nitroGLYCERIN (NITROGLYN) 2 % ointment 1 inch (1 inch Topical Given 09/16/22 1509)  iohexol (OMNIPAQUE) 350 MG/ML injection 100 mL (100 mLs Intravenous Contrast  Given 09/16/22 1448)  baclofen (LIORESAL) tablet 10 mg (10 mg Oral Given 09/16/22 1927)    Mobility walks     Focused Assessments     R Recommendations: See Admitting Provider Note  Report given to:   Additional Notes: A&O x 4  /  on RA / purewick

## 2022-09-17 ENCOUNTER — Other Ambulatory Visit (HOSPITAL_COMMUNITY): Payer: Self-pay | Admitting: *Deleted

## 2022-09-17 ENCOUNTER — Inpatient Hospital Stay (HOSPITAL_COMMUNITY)
Admit: 2022-09-17 | Discharge: 2022-09-17 | Disposition: A | Discharge status: Home / Self Care | Payer: 59 | Attending: Internal Medicine | Admitting: Internal Medicine

## 2022-09-17 ENCOUNTER — Inpatient Hospital Stay (HOSPITAL_COMMUNITY): Payer: 59

## 2022-09-17 ENCOUNTER — Encounter (HOSPITAL_COMMUNITY): Payer: Self-pay | Admitting: Internal Medicine

## 2022-09-17 DIAGNOSIS — J4489 Other specified chronic obstructive pulmonary disease: Secondary | ICD-10-CM | POA: Diagnosis not present

## 2022-09-17 DIAGNOSIS — I701 Atherosclerosis of renal artery: Secondary | ICD-10-CM | POA: Diagnosis not present

## 2022-09-17 DIAGNOSIS — J449 Chronic obstructive pulmonary disease, unspecified: Secondary | ICD-10-CM

## 2022-09-17 DIAGNOSIS — R079 Chest pain, unspecified: Secondary | ICD-10-CM | POA: Diagnosis not present

## 2022-09-17 DIAGNOSIS — R569 Unspecified convulsions: Secondary | ICD-10-CM

## 2022-09-17 DIAGNOSIS — I160 Hypertensive urgency: Secondary | ICD-10-CM | POA: Diagnosis not present

## 2022-09-17 DIAGNOSIS — R4182 Altered mental status, unspecified: Secondary | ICD-10-CM | POA: Diagnosis not present

## 2022-09-17 DIAGNOSIS — I161 Hypertensive emergency: Secondary | ICD-10-CM

## 2022-09-17 DIAGNOSIS — R0789 Other chest pain: Secondary | ICD-10-CM | POA: Diagnosis not present

## 2022-09-17 LAB — CK: Total CK: 132 U/L (ref 38–234)

## 2022-09-17 LAB — ECHOCARDIOGRAM COMPLETE
Area-P 1/2: 2.95 cm2
Est EF: >75
Height: 61 in
S' Lateral: 2 cm
Weight: 2123.47 oz

## 2022-09-17 LAB — T4, FREE: Free T4: 0.9 ng/dL (ref 0.61–1.12)

## 2022-09-17 LAB — BASIC METABOLIC PANEL
Anion gap: 9 (ref 5–15)
BUN: 16 mg/dL (ref 8–23)
CO2: 26 mmol/L (ref 22–32)
Calcium: 8.6 mg/dL — ABNORMAL LOW (ref 8.9–10.3)
Chloride: 102 mmol/L (ref 98–111)
Creatinine, Ser: 0.98 mg/dL (ref 0.44–1.00)
GFR, Estimated: 59 mL/min — ABNORMAL LOW (ref >60)
Glucose, Bld: 107 mg/dL — ABNORMAL HIGH (ref 70–99)
Potassium: 3.4 mmol/L — ABNORMAL LOW (ref 3.5–5.1)
Sodium: 137 mmol/L (ref 135–145)

## 2022-09-17 LAB — CBC
HCT: 44.3 % (ref 36.0–46.0)
Hemoglobin: 14.3 g/dL (ref 12.0–15.0)
MCH: 30.1 pg (ref 26.0–34.0)
MCHC: 32.3 g/dL (ref 30.0–36.0)
MCV: 93.3 fL (ref 80.0–100.0)
Platelets: 174 10*3/uL (ref 150–400)
RBC: 4.75 MIL/uL (ref 3.87–5.11)
RDW: 13.6 % (ref 11.5–15.5)
WBC: 6.1 10*3/uL (ref 4.0–10.5)
nRBC: 0 % (ref 0.0–0.2)

## 2022-09-17 LAB — VITAMIN B12: Vitamin B-12: 118 pg/mL — ABNORMAL LOW (ref 180–914)

## 2022-09-17 LAB — MRSA NEXT GEN BY PCR, NASAL: MRSA by PCR Next Gen: NOT DETECTED

## 2022-09-17 LAB — FOLATE: Folate: 12 ng/mL (ref >5.9)

## 2022-09-17 LAB — TSH: TSH: 3.63 u[IU]/mL (ref 0.350–4.500)

## 2022-09-17 MED ORDER — ARFORMOTEROL TARTRATE 15 MCG/2ML IN NEBU
15.0000 ug | INHALATION_SOLUTION | Freq: Two times a day (BID) | RESPIRATORY_TRACT | Status: DC
  Administered 2022-09-18 – 2022-09-20 (×5): 15 ug via RESPIRATORY_TRACT
  Filled 2022-09-17 (×5): qty 2

## 2022-09-17 MED ORDER — VITAMIN B-12 100 MCG PO TABS
500.0000 ug | ORAL_TABLET | Freq: Every day | ORAL | Status: DC
  Administered 2022-09-18 – 2022-09-20 (×3): 500 ug via ORAL
  Filled 2022-09-17 (×3): qty 5

## 2022-09-17 MED ORDER — CYANOCOBALAMIN 1000 MCG/ML IJ SOLN
1000.0000 ug | Freq: Once | INTRAMUSCULAR | Status: AC
  Administered 2022-09-17: 1000 ug via INTRAMUSCULAR
  Filled 2022-09-17: qty 1

## 2022-09-17 MED ORDER — ALBUTEROL SULFATE (2.5 MG/3ML) 0.083% IN NEBU
2.5000 mg | INHALATION_SOLUTION | Freq: Three times a day (TID) | RESPIRATORY_TRACT | Status: DC
  Administered 2022-09-18: 2.5 mg via RESPIRATORY_TRACT
  Filled 2022-09-17: qty 3

## 2022-09-17 MED ORDER — CHLORHEXIDINE GLUCONATE CLOTH 2 % EX PADS
6.0000 | MEDICATED_PAD | Freq: Every day | CUTANEOUS | Status: DC
  Administered 2022-09-17 – 2022-09-18 (×2): 6 via TOPICAL

## 2022-09-17 MED ORDER — KETOROLAC TROMETHAMINE 15 MG/ML IJ SOLN
15.0000 mg | Freq: Four times a day (QID) | INTRAMUSCULAR | Status: AC
  Administered 2022-09-17 – 2022-09-18 (×3): 15 mg via INTRAVENOUS
  Filled 2022-09-17 (×3): qty 1

## 2022-09-17 MED ORDER — METOPROLOL TARTRATE 25 MG PO TABS: 12.5000 mg | ORAL_TABLET | Freq: Two times a day (BID) | ORAL | Status: DC

## 2022-09-17 MED ORDER — BISOPROLOL FUMARATE 5 MG PO TABS
5.0000 mg | ORAL_TABLET | Freq: Every day | ORAL | Status: DC
  Administered 2022-09-17 – 2022-09-20 (×4): 5 mg via ORAL
  Filled 2022-09-17 (×4): qty 1

## 2022-09-17 MED ORDER — REVEFENACIN 175 MCG/3ML IN SOLN
175.0000 ug | Freq: Every day | RESPIRATORY_TRACT | Status: DC
  Administered 2022-09-17 – 2022-09-20 (×4): 175 ug via RESPIRATORY_TRACT
  Filled 2022-09-17 (×4): qty 3

## 2022-09-17 MED ORDER — ALBUTEROL SULFATE (2.5 MG/3ML) 0.083% IN NEBU
2.5000 mg | INHALATION_SOLUTION | Freq: Three times a day (TID) | RESPIRATORY_TRACT | Status: DC
  Administered 2022-09-17 (×3): 2.5 mg via RESPIRATORY_TRACT
  Filled 2022-09-17 (×3): qty 3

## 2022-09-17 MED ORDER — BUDESONIDE 0.5 MG/2ML IN SUSP
0.5000 mg | Freq: Two times a day (BID) | RESPIRATORY_TRACT | Status: DC
  Administered 2022-09-17 – 2022-09-20 (×7): 0.5 mg via RESPIRATORY_TRACT
  Filled 2022-09-17 (×7): qty 2

## 2022-09-17 MED ORDER — ISOSORBIDE DINITRATE 20 MG PO TABS
20.0000 mg | ORAL_TABLET | Freq: Three times a day (TID) | ORAL | Status: DC
  Administered 2022-09-17 – 2022-09-20 (×9): 20 mg via ORAL
  Filled 2022-09-17 (×10): qty 1

## 2022-09-17 MED ORDER — ACETAMINOPHEN 500 MG PO TABS
1000.0000 mg | ORAL_TABLET | Freq: Four times a day (QID) | ORAL | Status: DC
  Administered 2022-09-17 – 2022-09-20 (×10): 1000 mg via ORAL
  Filled 2022-09-17 (×11): qty 2

## 2022-09-17 NOTE — Progress Notes (Signed)
Responded to nursing call:  sciatica pain   Subjective: Patient complains of pain in right low back down to right foot.  Denies right leg weakness. Able to bear weight.  No bowel or bladder incontinence.  Has been constant, flexeril did not relieve  Vitals:   09/17/22 1245 09/17/22 1430 09/17/22 1500 09/17/22 1558  BP: (!) 106/44 (!) 117/44 (!) 106/51   Pulse: 91     Resp: 20 (!) 26 14   Temp:    98.3 F (36.8 C)  TempSrc:    Oral  SpO2: 98% 94%    Weight:      Height:       CV--RRR Lung--CTA Abd--soft+BS/NT Neuro--straight leg raise negative on right and left.  Sensation intact right and left legs at thigh and calf.  No visible rash or edema or open wounds on lower back or right hip.  DTR 1/4 bilateral patellar.  Mild tender to palpation right PSIS area.     Assessment/Plan:  Back strain/R hip pain -xray right hip and lumbar spine -toradol IV  -continue prn flexeril    Catarina Hartshorn, DO [facility]

## 2022-09-17 NOTE — Hospital Course (Signed)
82 year old female with a history of TIA/stroke, hypertension, hypertensive encephalopathy, tobacco abuse in remission, COPD presenting with a constellation of symptoms including chest pain, elevated blood pressure, and transient altered vision.  The patient was drinking her morning coffee when she began having chest discomfort that was pressure-like radiate down to her left arm.  She checked her blood pressure and her systolic blood pressure was 199.  As result EMS was activated.  The patient has some dizziness and nausea.  She denies syncope.  She denied any shortness of breath, palpitations or diaphoresis.  She is compliant with her medications.  Around the same time, the patient had some alterations in her vision.  She felt like she was seeing some orange spots and felt like her visual fields were discolored.  She states that she has had this type sensation in the past.  She was awake and alert and lucid during the entire episode.  She stated that her chest discomfort lasted about an hour.  While she was in the emergency department, she had an episode of whole body pain from the tip of her head to her toes.  During this episode, the patient felt like she was having difficulty moving her legs.  Again she was awake and communicative. She was started on nicardipine drip and admitted for further evaluation and treatment. ED Course: Temperature 98.1.  Heart rate 60s to 102.  Respirate rate 14-29.  Blood pressure systolic initially up to 237.  Given total of 50 mg of hydralazine without improvement. EKG shows sinus rhythm rate 70, no acute abnormalities.  Troponin 11 > 13. CTA chest/Abd/Pelvis- shows Moderate partially calcified atherosclerotic plaque overall. No dissection or aneurysm formation. Focal stenosis seen of the left renal artery with moderate atrophy of the left kidney. Please correlate for level of hypertension. Occluded inferior mesenteric artery.   Nicardipine drip started with improvement in  blood pressure down to 140s systolic.   EDP talked to Vascular Surg- Dr. Chestine Spore who states that there is good flow in the celiac trunk and the SMA and the patient would not require aggressive intervention for the IMA.  In regards to the renal artery stenosis, this can be followed outpatient in clinic.

## 2022-09-17 NOTE — Consult Note (Addendum)
CARDIOLOGY CONSULT NOTE    Patient ID: [name]; [id number]; 12/16/1940   Admit date: 09/16/2022 Date of Consult: 09/17/2022  Primary Care Provider: Mechele Claude, MD Primary Cardiologist:  Primary Electrophysiologist:     Patient Profile:   [name] is a 82 y.o. female with a hx of hypertensive urgency, left renal artery stenosis, COPD tobacco abuse in remission who is being seen today for the evaluation of hypertensive emergency at the request of Dr. Arbutus Leas.  History of Present Illness:   [name] is a 82 year old F known to have poorly controlled HTN, left renal artery stenosis, COPD, drug abuse in remission is currently admitted to hospitalist for the management of hypertensive emergency on nicardipine drip.  Patient has symptoms of DOE for the last few weeks but her blood pressure was around 220s on the other day associated with chest pain which brought her to the hospital. She also has symptoms of transient blurry vision due to high blood pressure on admission day. She is currently on nicardipine drip, DOE is resolved and blurry vision is also resolved.  She had similar chest pain when she had heart attack in 1980s but otherwise did not have any PCI or CABG.  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   Stroke    SLIGHT RT SIDE WEAKNESS 2001   Unintentional weight loss 03/19/2022    Past Surgical History:  Procedure Laterality Date   ABDOMINAL HYSTERECTOMY     ANTERIOR CERVICAL CORPECTOMY  03/07/2012   Procedure: ANTERIOR CERVICAL CORPECTOMY;  Surgeon: Barnett Abu, MD;  Location: [facility];  Service: Neurosurgery;  Laterality: N/A;  Cervical six-seven,  cervical seven-thoracic one Anterior cervical decompression/diskectomy/fusion, with Cervical seven Corpectomy, reconstruction using Allograft and Alphatec plate   CATARACT EXTRACTION W/PHACO Left 04/12/2017   Procedure: CATARACT EXTRACTION PHACO AND INTRAOCULAR LENS PLACEMENT (IOC);  Surgeon: Fabio Pierce, MD;  Location: [facility];  Service: Ophthalmology;  Laterality: Left;  CDE: 3.82   CATARACT EXTRACTION W/PHACO Right 05/03/2017   Procedure: CATARACT EXTRACTION PHACO AND INTRAOCULAR LENS PLACEMENT RIGHT EYE;  Surgeon: Fabio Pierce, MD;  Location: [facility];  Service: Ophthalmology;  Laterality: Right;  CDE: 3.89   MULTIPLE TOOTH EXTRACTIONS     TONSILLECTOMY         Inpatient Medications: Scheduled Meds:  albuterol  2.5 mg Nebulization Q8H   amLODipine  10 mg Oral Daily   arformoterol  15 mcg Nebulization BID   atorvastatin  80 mg Oral Daily   bisoprolol  5 mg Oral Daily   budesonide (PULMICORT) nebulizer solution  0.5 mg Nebulization BID   Chlorhexidine Gluconate Cloth  6 each Topical Daily   clopidogrel  75 mg Oral Daily   enoxaparin (LOVENOX) injection  40 mg Subcutaneous Q24H   hydrALAZINE  50 mg Oral TID   revefenacin  175 mcg Nebulization Daily   Continuous Infusions:  niCARDipine 3 mg/hr (09/17/22 0843)   PRN Meds: acetaminophen **OR** acetaminophen, cyclobenzaprine, polyethylene glycol  Allergies:    Allergies  Allergen Reactions   Penicillins Anaphylaxis, Swelling, Rash and Other (See Comments)    Has patient had a PCN reaction causing  immediate rash, facial/tongue/throat swelling, SOB or lightheadedness with hypotension: yes Has patient had a PCN reaction causing severe rash involving mucus membranes or skin necrosis: no Has patient had a PCN reaction that required hospitalization: yes Has patient had a PCN reaction occurring within the last 10 years: no If all of the above answers are "NO", then may proceed with Cephalosporin use.     Social History:   Social  History   Socioeconomic History   Marital status: Widowed    Spouse name: Not on file   Number of children: 5   Years of education: Not on file   Highest education level: Not on file  Occupational History   Occupation: retired    Comment: CNA  Tobacco Use   Smoking status: Former    Packs/day: 0.50    Years: 50.00    Additional pack years: 0.00    Total pack years: 25.00    Types: Cigarettes    Start date: 1962    Quit date: 03/24/2022    Years since quitting: 0.4   Smokeless tobacco: Never  Vaping Use   Vaping Use: Never used  Substance and Sexual Activity   Alcohol use: Yes    Comment: occasional   Drug use: No   Sexual activity: Not Currently    Birth control/protection: Surgical  Other Topics Concern   Not on file  Social History Narrative   5 children living, 1 deceased.    Children live out of state.   Social Determinants of Health   Financial Resource Strain: Low Risk  (01/27/2021)   Overall Financial Resource Strain (CARDIA)    Difficulty of Paying Living Expenses: Not very hard  Food Insecurity: No Food Insecurity (09/17/2022)   Hunger Vital Sign    Worried About Running Out of Food in the Last Year: Never true    Ran Out of Food in the Last Year: Never true  Transportation Needs: No Transportation Needs (09/17/2022)   PRAPARE - Administrator, Civil Service (Medical): No    Lack of Transportation (Non-Medical): No  Physical Activity: Sufficiently Active (01/27/2021)   Exercise Vital Sign    Days of Exercise per Week: 5 days    Minutes of Exercise per Session: 40 min  Stress: No Stress Concern Present (01/27/2021)   [employer] of Occupational Health - Occupational Stress Questionnaire    Feeling of Stress : Only a little  Social Connections: Moderately Integrated (01/27/2021)   Social Connection and Isolation Panel [NHANES]    Frequency of Communication with Friends and Family: More than three times a week    Frequency of Social Gatherings  with Friends and Family: More than three times a week    Attends Religious Services: More than 4 times per year    Active Member of [employer] or Organizations: Yes    Attends Banker Meetings: More than 4 times per year    Marital Status: Widowed  Intimate Partner Violence: Not At Risk (09/17/2022)   Humiliation, Afraid, Rape, and Kick questionnaire    Fear of Current or Ex-Partner: No    Emotionally Abused: No    Physically Abused: No    Sexually Abused: No    Family History:    Family History  Adopted: Yes  Problem Relation Age of Onset   Bipolar disorder Daughter    Heart disease Daughter    Asthma Daughter    Bipolar disorder Son    Bipolar disorder Daughter    Bipolar disorder  Daughter    Bipolar disorder Daughter    Hypertension Daughter    Heart disease Daughter    Drug abuse Daughter        OD     ROS:  Please see the history of present illness.  ROS  All other ROS reviewed and negative.     Physical Exam/Data:   Vitals:   09/17/22 0906 09/17/22 0910 09/17/22 0912 09/17/22 0917  BP:      Pulse:      Resp:      Temp:      TempSrc:      SpO2: 97% 100% 100% 100%  Weight:      Height:        Intake/Output Summary (Last 24 hours) at 09/17/2022 1021 Last data filed at 09/17/2022 0838 Gross per 24 hour  Intake 284.39 ml  Output 100 ml  Net 184.39 ml   Filed Weights   09/16/22 1204 09/17/22 0020 09/17/22 0300  Weight: 63.5 kg 60.6 kg 60.2 kg   Body mass index is 25.08 kg/m.  General:  Well nourished, well developed, in no acute distress HEENT: normal Lymph: no adenopathy Neck: no JVD Endocrine:  No thryomegaly Vascular: No carotid bruits; FA pulses 2+ bilaterally without bruits  Cardiac:  normal S1, S2; RRR; no murmur  Lungs:  clear to auscultation bilaterally, no wheezing, rhonchi or rales  Abd: soft, nontender, no hepatomegaly  Ext: no edema Musculoskeletal:  No deformities, BUE and BLE strength normal and equal Skin: warm and dry   Neuro:  CNs 2-12 intact, no focal abnormalities noted Psych:  Normal affect   EKG:  The EKG was personally reviewed and demonstrates: NSR Telemetry:  Telemetry was personally reviewed and demonstrates: NSR, mild elevated heart rates  Relevant CV Studies:   Laboratory Data:  Chemistry Recent Labs  Lab 09/16/22 1214 09/17/22 0556  NA 141 137  K 4.0 3.4*  CL 102 102  CO2 30 26  GLUCOSE 93 107*  BUN 17 16  CREATININE 0.99 0.98  CALCIUM 8.9 8.6*  GFRNONAA 58* 59*  ANIONGAP 9 9    No results for input(s): "PROT", "ALBUMIN", "AST", "ALT", "ALKPHOS", "BILITOT" in the last 168 hours. Hematology Recent Labs  Lab 09/16/22 1214 09/17/22 0556  WBC 4.6 6.1  RBC 4.79 4.75  HGB 14.7 14.3  HCT 45.6 44.3  MCV 95.2 93.3  MCH 30.7 30.1  MCHC 32.2 32.3  RDW 13.4 13.6  PLT 157 174   Cardiac EnzymesNo results for input(s): "TROPONINI" in the last 168 hours. No results for input(s): "TROPIPOC" in the last 168 hours.  BNPNo results for input(s): "BNP", "PROBNP" in the last 168 hours.  DDimer No results for input(s): "DDIMER" in the last 168 hours.  Radiology/Studies:  CT Angio Chest/Abd/Pel for Dissection W and/or W/WO  Result Date: 09/16/2022 CLINICAL DATA:  Chest pain and epigastric pain. Extending to the left arm. Hypertension EXAM: CT ANGIOGRAPHY CHEST, ABDOMEN AND PELVIS TECHNIQUE: Non-contrast CT of the chest was initially obtained. Multidetector CT imaging through the chest, abdomen and pelvis was performed using the standard protocol during bolus administration of intravenous contrast. Multiplanar reconstructed images and MIPs were obtained and reviewed to evaluate the vascular anatomy. RADIATION DOSE REDUCTION: This exam was performed according to the departmental dose-optimization program which includes automated exposure control, adjustment of the mA and/or kV according to patient size and/or use of iterative reconstruction technique. CONTRAST:  OMNIPAQUE IOHEXOL 350 MG/ML  SOLN COMPARISON:  CT angiogram chest 05/15/2021 and older.  X-ray earlier 09/16/2022 and older FINDINGS: CTA CHEST FINDINGS Cardiovascular: There is mild-to-moderate partially calcified atherosclerotic plaque along the thoracic aorta. No increased density on noncontrast imaging to suggest an intramural hematoma. Small saccular outpouching extending to the left from the aortic arch. Penetrating ulcer or irregular plaque. Otherwise no dissection or aneurysm formation. Diameter of the aorta just above the aortic root up to 2.7 cm. Aortic arch has a diameter approaching 2.6 cm. The ascending aorta at the level of the right pulmonary artery measures 2.9 x 3.0 cm in the descending thoracic aorta at the same level measures 2.5 x 2.5 cm. The heart is nonenlarged. No significant pericardial effusion. Coronary artery calcifications are seen. Mediastinum/Nodes: Slightly patulous thoracic esophagus. Preserved thyroid gland. No specific abnormal lymph node enlargement identified in the axillary regions, hilum or mediastinum. Lungs/Pleura: There is some linear opacity lung bases likely scar or atelectasis. No consolidation, pneumothorax or effusion. 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 Musculoskeletal: Fixation hardware seen along the lower cervical spine the edge of the imaging field. Scattered degenerative changes of the thoracic spine. Global osteopenia. Review of the MIP images confirms the above findings. CTA ABDOMEN AND PELVIS FINDINGS VASCULAR Aorta: Moderate calcified plaque identified along the abdominal aorta without dissection or aneurysm formation. Celiac: Mild plaque along the celiac.  Standard branching pattern. SMA: Patent SMA origin. Renals: There is some atherosclerotic plaque along the origin of the renal arteries. Moderate stenosis on the left at the origin. There is moderate atrophy of the left kidney. IMA: Focally occluded. Inflow: Mild-to-moderate atherosclerotic partially calcified plaque  along the common iliac arteries. Only mild areas of stenosis, less than 50%. Less significant disease along the external iliac arteries. Moderate along the internal iliac with areas of significant ostial stenosis. Veins: No obvious venous abnormality within the limitations of this arterial phase study. Review of the MIP images confirms the above findings. NON-VASCULAR Hepatobiliary: With the limits of the arterial phase of the contrast bolus, the liver has some small low-attenuation lesions consistent with benign cysts. Example right hepatic lobe segment 7 measuring 13 mm in diameter with Hounsfield unit of 14. No specific imaging follow-up. Portal vein is patent. Gallbladder is mildly distended. Pancreas: Unremarkable. No pancreatic ductal dilatation or surrounding inflammatory changes. Spleen: Normal in size without focal abnormality. Adrenals/Urinary Tract: The adrenal glands are preserved. There is moderate atrophy of the left kidney and mild right. Small cystic foci are noted along each kidney, Bosniak 1 and 2 lesions. No specific imaging follow up Stomach/Bowel: There is fluid along the nondilated stomach. Small and large bowel are nondilated. Scattered colonic stool. Overall moderate stool burden. Few colonic diverticula. Normal retrocecal appendix in the right hemipelvis Lymphatic: No developing abnormal lymph node enlargement identified in the abdomen and pelvis. Reproductive: Prostate is unremarkable. Other: Mild anasarca.  No free air or free fluid. Musculoskeletal: Scattered degenerative changes of the spine and pelvis. Review of the MIP images confirms the above findings. IMPRESSION: Moderate partially calcified atherosclerotic plaque overall. No dissection or aneurysm formation. Focal stenosis seen of the left renal artery with moderate atrophy of the left kidney. Please correlate for level of hypertension. Occluded inferior mesenteric artery. Colonic diverticula.  Moderate colonic stool. No  obstruction, free air or free fluid. Centrilobular emphysematous lung changes. Electronically Signed   By: Karen Kays M.D.   On: 09/16/2022 15:07   DG Chest Port 1 View  Result Date: 09/16/2022 CLINICAL DATA:  Chest pain. EXAM: PORTABLE CHEST 1 VIEW COMPARISON:  One-view chest x-ray 03/25/2022  FINDINGS: The heart size is normal. Atherosclerotic calcifications are present at the aortic arch. And chronic bronchitic changes are most evident the lung apices. No superimposed acute disease is present. No edema or effusion is present. No focal airspace disease is present. IMPRESSION: 1. No acute cardiopulmonary disease. 2. Chronic bronchitic changes. Electronically Signed   By: Marin Roberts M.D.   On: 09/16/2022 12:22    Assessment and Plan:   Patient is a 82 year old F known to have poorly controlled HTN, COPD, nicotine abuse in remission is currently admitted to hospitalist team for the management of hypertensive emergency.  # Hypertensive emergency -Wean off the nicardipine drip, continue amlodipine 10 mg once daily, hydralazine 50 mg three times daily and start isosorbide dinitrate 20 mg three times daily. -Follow-up on 2D echocardiogram.  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 # COPD: Management per primary team   I have spent a total of 70 minutes with patient reviewing chart , telemetry, EKGs, labs and examining patient as well as establishing an assessment and plan that was discussed with the patient.  > 50% of time was spent in direct patient care.       For questions or updates, please contact [facility] Please consult [email or web address] for contact info under Cardiology/STEMI.   Signed, Herbert Deaner, MD 09/17/2022 10:21 AM

## 2022-09-17 NOTE — TOC Progression Note (Signed)
Transition of Care [facility]) - Progression Note    Patient Details  Name: [name] MRN: [id number] Date of Birth: 08/12/1940  Transition of Care [facility]) CM/SW Contact  Karn Cassis, [place] Phone Number: 09/17/2022, 10:22 AM  Clinical Narrative:   Transition of Care [facility]) Screening Note   Patient Details  Name: [name] Date of Birth: April 21, 1941   Transition of Care [facility]) CM/SW Contact:    Karn Cassis, LCSW Phone Number: 09/17/2022, 10:22 AM    Transition of Care Department [facility]) has reviewed patient and no TOC needs have been identified at this time. We will continue to monitor patient advancement through interdisciplinary progression rounds. If new patient transition needs arise, please place a TOC consult.         Barriers to Discharge: Continued Medical Work up  Expected Discharge Plan and Services                                               Social Determinants of Health (SDOH) Interventions SDOH Screenings   Food Insecurity: No Food Insecurity (09/17/2022)  Housing: Low Risk  (09/17/2022)  Transportation Needs: No Transportation Needs (09/17/2022)  Utilities: Not At Risk (09/17/2022)  Alcohol Screen: Low Risk  (01/27/2021)  Depression (PHQ2-9): Low Risk  (06/20/2022)  Financial Resource Strain: Low Risk  (01/27/2021)  Physical Activity: Sufficiently Active (01/27/2021)  Social Connections: Moderately Integrated (01/27/2021)  Stress: No Stress Concern Present (01/27/2021)  Tobacco Use: Medium Risk (09/17/2022)    Readmission Risk Interventions     No data to display

## 2022-09-17 NOTE — Procedures (Signed)
Patient Name: [name]  MRN: [id number]  Epilepsy Attending: Charlsie Quest  Referring Physician/Provider: Catarina Hartshorn, MD  Date: 09/17/2022 Duration: 23.03 mins  Patient history: 82yo M getting eeg to evaluate for seizure  Level of alertness: Awake  AEDs during EEG study: None  Technical aspects: This EEG study was done with scalp electrodes positioned according to the 10-20 International system of electrode placement. Electrical activity was reviewed with band pass filter of 1-70Hz , sensitivity of 7 uV/mm, display speed of 35mm/sec with a  notched filter applied as appropriate. EEG data were recorded continuously and digitally stored.  Video monitoring was available and reviewed as appropriate.  Description: The posterior dominant rhythm consists of 9-10 Hz activity of moderate voltage (25-35 uV) seen predominantly in posterior head regions, symmetric and reactive to eye opening and eye closing. Hyperventilation and photic stimulation were not performed.     IMPRESSION: This study is within normal limits. No seizures or epileptiform discharges were seen throughout the recording.  A normal interictal EEG does not exclude the diagnosis of epilepsy.   Bookert Guzzi Annabelle Harman

## 2022-09-17 NOTE — Progress Notes (Signed)
Patient transferred down to stepdown/ICU bed IC08. Bedside report given to [facility].

## 2022-09-17 NOTE — Progress Notes (Signed)
Patient has an order on the [facility] for nicardipine continuous. This medication is not given on 3A. MD Lenard Forth at [phone]. Received order for Transfer 0143.

## 2022-09-17 NOTE — Progress Notes (Signed)
EEG complete - results pending 

## 2022-09-17 NOTE — Progress Notes (Addendum)
PROGRESS NOTE  [name] IRJ:[id number] DOB: [birth year] DOA: 09/16/2022 PCP: Mechele Claude, MD  Brief History:  82 year old female with a history of TIA/stroke, hypertension, hypertensive cephalopathy, tobacco abuse in remission, COPD presenting with a constellation of symptoms including chest pain, elevated blood pressure, and transient altered vision.  The patient was drinking her morning coffee when she began having chest discomfort that was pressure-like radiate down to her left arm.  She checked her blood pressure and her systolic blood pressure was 199.  As result EMS was activated.  The patient has some dizziness and nausea.  She denies syncope.  She denied any shortness of breath, palpitations or diaphoresis.  She is compliant with her medications.  Around the same time, the patient had some alterations in her vision.  She felt like she was seeing some orange spots and felt like her visual fields were discolored.  She states that she has had this type sensation in the past.  She was awake and alert and lucid during the entire episode.  She stated that her chest discomfort lasted about an hour.   While she was in the emergency department, she had an episode of whole body pain from the tip of her head to her toes.  During this episode, the patient felt like she was having difficulty moving her legs.  Again she was awake and communicative. She was started on nicardipine drip and admitted for further evaluation and treatment. ED Course: Temperature 98.1.  Heart rate 60s to 102.  Respirate rate 14-29.  Blood pressure systolic initially up to 237.  Given total of 50 mg of hydralazine without improvement. EKG shows sinus rhythm rate 70, no acute abnormalities.  Troponin 11 > 13. CTA chest/Abd/Pelvis- shows Moderate partially calcified atherosclerotic plaque overall. No dissection or aneurysm formation. Focal stenosis seen of the left renal artery with moderate atrophy of the left  kidney. Please correlate for level of hypertension. Occluded inferior mesenteric artery.   Nicardipine drip started with improvement in blood pressure down to 140s systolic.   EDP talked to Vascular Surg- Dr. Chestine Spore who states that there is good flow in the celiac trunk and the SMA and the patient would not require aggressive intervention for the IMA.  In regards to the renal artery stenosis, this can be followed outpatient in clinic.      Assessment/Plan: Hypertensive urgency -SBP up to 221/88 -remains on nicardipine drip -Wean off nicardipine drip as oral antihypertensive medications were introduced -Restart amlodipine, hydralazine -Start low-dose beta-blockade -Wean nicardipine drip for SBP<160  Chest pain -Troponins 11>13 -Cardiology consult -CT coronary 03/30/22-diagnosed with moderate atherosclerosis, symptom guided anti-ischemic pharmacotherapy and risk factor modification recommended.  Subsequent coronary CT FFR analysis demonstrated no significant flow-limiting lesions.  Follows with cardiology.  -Resume Plavix and statin  Left renal artery stenosis -CTA chest abdomen and pelvis - focal stenosis seen of the left renal artery with moderate atrophy of the left kidney, occluded inferior mesenteric artery. - EDP spoke with the vascular surgeon on-call Dr. Chestine Spore - there is good flow in the celiac trunk and the SMA and the patient would not require aggressive intervention for the IMA.  In regards to the renal artery stenosis, this can be followed as outpatient,  But is likely driving some of this hypertension.   COPD -Stable on room air -Restart home Yupelri and Pulmicort -Continue albuterol  Hypokalemia -replete -check mag         Family  Communication:  no Family at bedside  Consultants:  cardiology  Code Status:  FULL   DVT Prophylaxis:  [place] Lovenox   Procedures: As Listed in Progress Note Above  Antibiotics: None     Subjective: Patient denies fevers,  chills, headache, chest pain, dyspnea, nausea, vomiting, diarrhea, abdominal pain, dysuria, hematuria, hematochezia, and melena.   Objective: Vitals:   09/17/22 0600 09/17/22 0615 09/17/22 0630 09/17/22 0732  BP: (!) 145/61 (!) 158/61 (!) 154/55   Pulse: 88 80 78   Resp: 15 18 15    Temp:    98.4 F (36.9 C)  TempSrc:    Oral  SpO2: 96% 97% 98%   Weight:      Height:        Intake/Output Summary (Last 24 hours) at 09/17/2022 0851 Last data filed at 09/17/2022 0838 Gross per 24 hour  Intake 284.39 ml  Output 100 ml  Net 184.39 ml   Weight change:  Exam:  General:  Pt is alert, follows commands appropriately, not in acute distress HEENT: No icterus, No thrush, No neck mass, [place]/AT Cardiovascular: RRR, S1/S2, no rubs, no gallops Respiratory: CTA bilaterally, no wheezing, no crackles, no rhonchi Abdomen: Soft/+BS, non tender, non distended, no guarding Extremities: No edema, No lymphangitis, No petechiae, No rashes, no synovitis Neuro:  CN II-XII intact, strength 4/5 in RUE, RLE, strength 4/5 LUE, LLE; sensation intact bilateral; no dysmetria; babinski equivocal    Data Reviewed: I have personally reviewed following labs and imaging studies Basic Metabolic Panel: Recent Labs  Lab 09/16/22 1214 09/17/22 0556  NA 141 137  K 4.0 3.4*  CL 102 102  CO2 30 26  GLUCOSE 93 107*  BUN 17 16  CREATININE 0.99 0.98  CALCIUM 8.9 8.6*  MG 2.3  --    Liver Function Tests: No results for input(s): "AST", "ALT", "ALKPHOS", "BILITOT", "PROT", "ALBUMIN" in the last 168 hours. No results for input(s): "LIPASE", "AMYLASE" in the last 168 hours. No results for input(s): "AMMONIA" in the last 168 hours. Coagulation Profile: No results for input(s): "INR", "PROTIME" in the last 168 hours. CBC: Recent Labs  Lab 09/16/22 1214 09/17/22 0556  WBC 4.6 6.1  HGB 14.7 14.3  HCT 45.6 44.3  MCV 95.2 93.3  PLT 157 174   Cardiac Enzymes: No results for input(s): "CKTOTAL", "CKMB",  "CKMBINDEX", "TROPONINI" in the last 168 hours. BNP: Invalid input(s): "POCBNP" CBG: No results for input(s): "GLUCAP" in the last 168 hours. HbA1C: No results for input(s): "HGBA1C" in the last 72 hours. Urine analysis:    Component Value Date/Time   COLORURINE YELLOW 01/22/2020 1500   APPEARANCEUR CLEAR 01/22/2020 1500   LABSPEC 1.045 (H) 01/22/2020 1500   PHURINE 5.0 01/22/2020 1500   GLUCOSEU NEGATIVE 01/22/2020 1500   HGBUR NEGATIVE 01/22/2020 1500   BILIRUBINUR NEGATIVE 01/22/2020 1500   KETONESUR 5 (A) 01/22/2020 1500   PROTEINUR NEGATIVE 01/22/2020 1500   UROBILINOGEN 0.2 02/11/2012 1600   NITRITE NEGATIVE 01/22/2020 1500   LEUKOCYTESUR NEGATIVE 01/22/2020 1500   Sepsis Labs: @LABRCNTIP (procalcitonin:4,lacticidven:4) )No results found for this or any previous visit (from the past 240 hour(s)).   Scheduled Meds:  amLODipine  10 mg Oral Daily   arformoterol  15 mcg Nebulization BID   atorvastatin  80 mg Oral Daily   Chlorhexidine Gluconate Cloth  6 each Topical Daily   clopidogrel  75 mg Oral Daily   enoxaparin (LOVENOX) injection  40 mg Subcutaneous Q24H   hydrALAZINE  50 mg Oral TID   mometasone-formoterol  2 puff Inhalation BID   Continuous Infusions:  niCARDipine 3 mg/hr (09/17/22 0843)    Procedures/Studies: CT Angio Chest/Abd/Pel for Dissection W and/or W/WO  Result Date: 09/16/2022 CLINICAL DATA:  Chest pain and epigastric pain. Extending to the left arm. Hypertension EXAM: CT ANGIOGRAPHY CHEST, ABDOMEN AND PELVIS TECHNIQUE: Non-contrast CT of the chest was initially obtained. Multidetector CT imaging through the chest, abdomen and pelvis was performed using the standard protocol during bolus administration of intravenous contrast. Multiplanar reconstructed images and MIPs were obtained and reviewed to evaluate the vascular anatomy. RADIATION DOSE REDUCTION: This exam was performed according to the departmental dose-optimization program which includes  automated exposure control, adjustment of the mA and/or kV according to patient size and/or use of iterative reconstruction technique. CONTRAST:  OMNIPAQUE IOHEXOL 350 MG/ML SOLN COMPARISON:  CT angiogram chest 05/15/2021 and older. X-ray earlier 09/16/2022 and older FINDINGS: CTA CHEST FINDINGS Cardiovascular: There is mild-to-moderate partially calcified atherosclerotic plaque along the thoracic aorta. No increased density on noncontrast imaging to suggest an intramural hematoma. Small saccular outpouching extending to the left from the aortic arch. Penetrating ulcer or irregular plaque. Otherwise no dissection or aneurysm formation. Diameter of the aorta just above the aortic root up to 2.7 cm. Aortic arch has a diameter approaching 2.6 cm. The ascending aorta at the level of the right pulmonary artery measures 2.9 x 3.0 cm in the descending thoracic aorta at the same level measures 2.5 x 2.5 cm. The heart is nonenlarged. No significant pericardial effusion. Coronary artery calcifications are seen. Mediastinum/Nodes: Slightly patulous thoracic esophagus. Preserved thyroid gland. No specific abnormal lymph node enlargement identified in the axillary regions, hilum or mediastinum. Lungs/Pleura: There is some linear opacity lung bases likely scar or atelectasis. No consolidation, pneumothorax or effusion. 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 Musculoskeletal: Fixation hardware seen along the lower cervical spine the edge of the imaging field. Scattered degenerative changes of the thoracic spine. Global osteopenia. Review of the MIP images confirms the above findings. CTA ABDOMEN AND PELVIS FINDINGS VASCULAR Aorta: Moderate calcified plaque identified along the abdominal aorta without dissection or aneurysm formation. Celiac: Mild plaque along the celiac.  Standard branching pattern. SMA: Patent SMA origin. Renals: There is some atherosclerotic plaque along the origin of the renal arteries.  Moderate stenosis on the left at the origin. There is moderate atrophy of the left kidney. IMA: Focally occluded. Inflow: Mild-to-moderate atherosclerotic partially calcified plaque along the common iliac arteries. Only mild areas of stenosis, less than 50%. Less significant disease along the external iliac arteries. Moderate along the internal iliac with areas of significant ostial stenosis. Veins: No obvious venous abnormality within the limitations of this arterial phase study. Review of the MIP images confirms the above findings. NON-VASCULAR Hepatobiliary: With the limits of the arterial phase of the contrast bolus, the liver has some small low-attenuation lesions consistent with benign cysts. Example right hepatic lobe segment 7 measuring 13 mm in diameter with Hounsfield unit of 14. No specific imaging follow-up. Portal vein is patent. Gallbladder is mildly distended. Pancreas: Unremarkable. No pancreatic ductal dilatation or surrounding inflammatory changes. Spleen: Normal in size without focal abnormality. Adrenals/Urinary Tract: The adrenal glands are preserved. There is moderate atrophy of the left kidney and mild right. Small cystic foci are noted along each kidney, Bosniak 1 and 2 lesions. No specific imaging follow up Stomach/Bowel: There is fluid along the nondilated stomach. Small and large bowel are nondilated. Scattered colonic stool. Overall moderate stool burden. Few colonic diverticula. Normal retrocecal  appendix in the right hemipelvis Lymphatic: No developing abnormal lymph node enlargement identified in the abdomen and pelvis. Reproductive: Prostate is unremarkable. Other: Mild anasarca.  No free air or free fluid. Musculoskeletal: Scattered degenerative changes of the spine and pelvis. Review of the MIP images confirms the above findings. IMPRESSION: Moderate partially calcified atherosclerotic plaque overall. No dissection or aneurysm formation. Focal stenosis seen of the left renal artery  with moderate atrophy of the left kidney. Please correlate for level of hypertension. Occluded inferior mesenteric artery. Colonic diverticula.  Moderate colonic stool. No obstruction, free air or free fluid. Centrilobular emphysematous lung changes. Electronically Signed   By: Karen Kays M.D.   On: 09/16/2022 15:07   DG Chest Port 1 View  Result Date: 09/16/2022 CLINICAL DATA:  Chest pain. EXAM: PORTABLE CHEST 1 VIEW COMPARISON:  One-view chest x-ray 03/25/2022 FINDINGS: The heart size is normal. Atherosclerotic calcifications are present at the aortic arch. And chronic bronchitic changes are most evident the lung apices. No superimposed acute disease is present. No edema or effusion is present. No focal airspace disease is present. IMPRESSION: 1. No acute cardiopulmonary disease. 2. Chronic bronchitic changes. Electronically Signed   By: Marin Roberts M.D.   On: 09/16/2022 12:22   US Carotid Duplex Bilateral  Result Date: 08/25/2022 CLINICAL DATA:  Amaurosis fugax. History of stroke (2001), hypertension. Former smoker. EXAM: BILATERAL CAROTID DUPLEX ULTRASOUND TECHNIQUE: Wallace Cullens scale imaging, color Doppler and duplex ultrasound were performed of bilateral carotid and vertebral arteries in the neck. COMPARISON:  None Available. FINDINGS: Criteria: Quantification of carotid stenosis is based on velocity parameters that correlate the residual internal carotid diameter with NASCET-based stenosis levels, using the diameter of the distal internal carotid lumen as the denominator for stenosis measurement. The following velocity measurements were obtained: RIGHT ICA: 112/19 cm/sec CCA: 99/10 cm/sec SYSTOLIC ICA/CCA RATIO:  1.1 ECA: 34 cm/sec LEFT ICA: 62/12 cm/sec CCA: 59/8 cm/sec SYSTOLIC ICA/CCA RATIO:  1.1 ECA: 67 cm/sec RIGHT CAROTID ARTERY: There is a minimal amount of eccentric echogenic plaque scattered throughout the right common carotid artery (image 10). There is a moderate to large amount of  eccentric echogenic plaque involving the right carotid bulb (image 19), extending to involve the origin and proximal aspects of the right internal carotid artery (image 27), not resulting in elevated peak systolic velocities within the interrogated course of the right internal carotid artery to suggest a hemodynamically significant stenosis. RIGHT VERTEBRAL ARTERY:  Antegrade flow LEFT CAROTID ARTERY: There is a minimal amount of eccentric echogenic plaque within the left carotid bulb (image 55), extending to involve the origin and proximal aspects of the left internal carotid artery (image 63), not resulting in elevated peak systolic velocities within the interrogated course of the left internal carotid artery to suggest a hemodynamically significant stenosis. LEFT VERTEBRAL ARTERY:  Antegrade flow IMPRESSION: 1. Moderate-to-large amount of right-sided atherosclerotic plaque, not resulting in a hemodynamically significant stenosis. 2. Minimal amount of left-sided atherosclerotic plaque, not resulting in a hemodynamically significant stenosis. Electronically Signed   By: Simonne Come M.D.   On: 08/25/2022 08:49    Catarina Hartshorn, DO  [facility]  If 7PM-7AM, please contact night-coverage [email or web address] Password [facility] 09/17/2022, 8:51 AM   LOS: 1 day

## 2022-09-17 NOTE — Progress Notes (Signed)
*  PRELIMINARY RESULTS* Echocardiogram 2D Echocardiogram has been performed.  Stacey Drain 09/17/2022, 12:48 PM

## 2022-09-18 ENCOUNTER — Inpatient Hospital Stay (HOSPITAL_COMMUNITY): Payer: 59

## 2022-09-18 ENCOUNTER — Telehealth: Payer: Self-pay | Admitting: Family Medicine

## 2022-09-18 DIAGNOSIS — R0789 Other chest pain: Secondary | ICD-10-CM | POA: Diagnosis not present

## 2022-09-18 DIAGNOSIS — I701 Atherosclerosis of renal artery: Secondary | ICD-10-CM | POA: Diagnosis not present

## 2022-09-18 DIAGNOSIS — I160 Hypertensive urgency: Secondary | ICD-10-CM | POA: Diagnosis not present

## 2022-09-18 DIAGNOSIS — J449 Chronic obstructive pulmonary disease, unspecified: Secondary | ICD-10-CM | POA: Diagnosis not present

## 2022-09-18 LAB — BASIC METABOLIC PANEL
Anion gap: 7 (ref 5–15)
BUN: 32 mg/dL — ABNORMAL HIGH (ref 8–23)
CO2: 26 mmol/L (ref 22–32)
Calcium: 8.4 mg/dL — ABNORMAL LOW (ref 8.9–10.3)
Chloride: 101 mmol/L (ref 98–111)
Creatinine, Ser: 1.85 mg/dL — ABNORMAL HIGH (ref 0.44–1.00)
GFR, Estimated: 27 mL/min — ABNORMAL LOW (ref >60)
Glucose, Bld: 109 mg/dL — ABNORMAL HIGH (ref 70–99)
Potassium: 3.7 mmol/L (ref 3.5–5.1)
Sodium: 134 mmol/L — ABNORMAL LOW (ref 135–145)

## 2022-09-18 LAB — MAGNESIUM: Magnesium: 2.3 mg/dL (ref 1.7–2.4)

## 2022-09-18 MED ORDER — ALBUTEROL SULFATE (2.5 MG/3ML) 0.083% IN NEBU
2.5000 mg | INHALATION_SOLUTION | Freq: Two times a day (BID) | RESPIRATORY_TRACT | Status: DC
  Administered 2022-09-18 – 2022-09-19 (×3): 2.5 mg via RESPIRATORY_TRACT
  Filled 2022-09-18 (×3): qty 3

## 2022-09-18 MED ORDER — ENOXAPARIN SODIUM 30 MG/0.3ML IJ SOSY
30.0000 mg | PREFILLED_SYRINGE | INTRAMUSCULAR | Status: DC
  Administered 2022-09-18 – 2022-09-20 (×3): 30 mg via SUBCUTANEOUS
  Filled 2022-09-18 (×3): qty 0.3

## 2022-09-18 NOTE — Progress Notes (Signed)
1 month cardiology f/u appt arranged and listed on AVS.

## 2022-09-18 NOTE — Progress Notes (Addendum)
Progress Note  Patient Name: [name] Date of Encounter: 09/18/2022  Primary Cardiologist: Chilton Si, MD  Subjective   No acute events overnight, blood pressure controlled after adding  ISDN.  No symptoms.  Inpatient Medications    Scheduled Meds:  acetaminophen  1,000 mg Oral Q6H   albuterol  2.5 mg Nebulization TID   amLODipine  10 mg Oral Daily   arformoterol  15 mcg Nebulization BID   atorvastatin  80 mg Oral Daily   bisoprolol  5 mg Oral Daily   budesonide (PULMICORT) nebulizer solution  0.5 mg Nebulization BID   Chlorhexidine Gluconate Cloth  6 each Topical Daily   clopidogrel  75 mg Oral Daily   enoxaparin (LOVENOX) injection  30 mg Subcutaneous Q24H   hydrALAZINE  50 mg Oral TID   isosorbide dinitrate  20 mg Oral TID   revefenacin  175 mcg Nebulization Daily   vitamin B-12  500 mcg Oral Daily   Continuous Infusions:  niCARDipine 3 mg/hr (09/17/22 0843)   PRN Meds: cyclobenzaprine, polyethylene glycol   Vital Signs    Vitals:   09/18/22 0544 09/18/22 0700 09/18/22 0827 09/18/22 0933  BP: 135/61  (!) 117/54 133/62  Pulse: 82  77 85  Resp: 20  18   Temp: 98.4 F (36.9 C)  98.5 F (36.9 C)   TempSrc: Oral  Oral   SpO2: 99% 98% 98%   Weight:      Height:        Intake/Output Summary (Last 24 hours) at 09/18/2022 1034 Last data filed at 09/17/2022 1700 Gross per 24 hour  Intake 410 ml  Output 400 ml  Net 10 ml   Filed Weights   09/16/22 1204 09/17/22 0020 09/17/22 0300  Weight: 63.5 kg 60.6 kg 60.2 kg    Telemetry    Personally reviewed, NSR  ECG    Not performed today  Physical Exam   GEN: No acute distress.   Neck: No JVD. Cardiac: RRR, no murmur, rub, or gallop.  Respiratory: Nonlabored. Clear to auscultation bilaterally. GI: Soft, nontender, bowel sounds present. MS: No edema; No deformity. Neuro:  Nonfocal. Psych: Alert and oriented x 3. Normal affect.  Labs    Chemistry Recent Labs  Lab 09/16/22 1214  09/17/22 0556 09/18/22 0457  NA 141 137 134*  K 4.0 3.4* 3.7  CL 102 102 101  CO2 GLUCOSE 93 107* 109*  BUN 17 16 32*  CREATININE 0.99 0.98 1.85*  CALCIUM 8.9 8.6* 8.4*  GFRNONAA 58* 59* 27*  ANIONGAP Hematology Recent Labs  Lab 09/16/22 1214 09/17/22 0556  WBC 4.6 6.1  RBC 4.79 4.75  HGB 14.7 14.3  HCT 45.6 44.3  MCV 95.2 93.3  MCH 30.7 30.1  MCHC 32.2 32.3  RDW 13.4 13.6  PLT 157 174    Cardiac Enzymes Recent Labs  Lab 09/16/22 1214 09/16/22 1505  TROPONINIHS 11 13    BNPNo results for input(s): "BNP", "PROBNP" in the last 168 hours.   DDimerNo results for input(s): "DDIMER" in the last 168 hours.   Radiology    ECHOCARDIOGRAM COMPLETE  Result Date: 09/17/2022    ECHOCARDIOGRAM REPORT   Patient Name:   [name] Date of Exam: 09/17/2022 Medical Rec #:  [id number]        Height:       61.0 in Accession #:    [id number]       Weight:  132.7 lb Date of Birth:  August 05, 1940         BSA:          1.587 m Patient Age:    45 years         BP:           154/55 mmHg Patient Gender: F                HR:           78 bpm. [name] Procedure: 2D Echo, Cardiac Doppler and Color Doppler Indications:    Chest Pain R07.9  History:        Patient has prior history of Echocardiogram examinations, most                 recent 02/12/2012. COPD, TIA and Stroke; Risk                 Factors:Hypertension and Dyslipidemia. Hx of Tobacco abuse.  Sonographer:    Celesta Gentile RCS Referring Phys: [phone] DAVID TAT IMPRESSIONS  1. Left ventricular ejection fraction, by estimation, is >75%. The left ventricle has hyperdynamic function. The left ventricle has no regional wall motion abnormalities. Left ventricular diastolic parameters are consistent with Grade I diastolic dysfunction (impaired relaxation).  2. Right ventricular systolic function is normal. The right ventricular size is normal. Tricuspid regurgitation signal is inadequate for assessing PA  pressure.  3. The mitral valve is normal in structure. No evidence of mitral valve regurgitation. No evidence of mitral stenosis.  4. The aortic valve was not well visualized. Aortic valve regurgitation is not visualized. No aortic stenosis is present.  5. The inferior vena cava is normal in size with greater than 50% respiratory variability, suggesting right atrial pressure of 3 mmHg. Comparison(s): No prior Echocardiogram. FINDINGS  Left Ventricle: Left ventricular ejection fraction, by estimation, is >75%. The left ventricle has hyperdynamic function. The left ventricle has no regional wall motion abnormalities. The left ventricular internal cavity size was normal in size. There is no left ventricular hypertrophy. Left ventricular diastolic parameters are consistent with Grade I diastolic dysfunction (impaired relaxation). Right Ventricle: The right ventricular size is normal. No increase in right ventricular wall thickness. Right ventricular systolic function is normal. Tricuspid regurgitation signal is inadequate for assessing PA pressure. Left Atrium: Left atrial size was normal in size. Right Atrium: Right atrial size was normal in size. Pericardium: There is no evidence of pericardial effusion. Mitral Valve: The mitral valve is normal in structure. There is mild thickening of the mitral valve leaflet(s). There is mild calcification of the mitral valve leaflet(s). No evidence of mitral valve regurgitation. No evidence of mitral valve stenosis. Tricuspid Valve: The tricuspid valve is normal in structure. Tricuspid valve regurgitation is not demonstrated. No evidence of tricuspid stenosis. Aortic Valve: The aortic valve was not well visualized. Aortic valve regurgitation is not visualized. No aortic stenosis is present. Pulmonic Valve: The pulmonic valve was not well visualized. Pulmonic valve regurgitation is not visualized. No evidence of pulmonic stenosis. Aorta: The aortic root is normal in size and  structure. Venous: The inferior vena cava is normal in size with greater than 50% respiratory variability, suggesting right atrial pressure of 3 mmHg. IAS/Shunts: No atrial level shunt detected by color flow Doppler.  LEFT VENTRICLE PLAX 2D LVIDd:         4.10 cm   Diastology LVIDs:         2.00 cm   LV e'  medial:    6.74 cm/s LV PW:         1.00 cm   LV E/e' medial:  8.7 LV IVS:        1.00 cm   LV e' lateral:   7.40 cm/s LVOT diam:     1.80 cm   LV E/e' lateral: 7.9 LV SV:         67 LV SV Index:   43 LVOT Area:     2.54 cm  RIGHT VENTRICLE RV S prime:     12.80 cm/s TAPSE (M-mode): 2.5 cm LEFT ATRIUM             Index        RIGHT ATRIUM           Index LA diam:        3.10 cm 1.95 cm/m   RA Area:     15.50 cm LA Vol (A2C):   50.7 ml 31.95 ml/m  RA Volume:   42.00 ml  26.47 ml/m LA Vol (A4C):   48.6 ml 30.63 ml/m LA Biplane Vol: 50.0 ml 31.51 ml/m  AORTIC VALVE LVOT Vmax:   130.00 cm/s LVOT Vmean:  93.200 cm/s LVOT VTI:    0.265 m  AORTA Ao Root diam: 2.80 cm MITRAL VALVE MV Area (PHT): 2.95 cm    SHUNTS MV Decel Time: 257 msec    Systemic VTI:  0.26 m MV E velocity: 58.70 cm/s  Systemic Diam: 1.80 cm MV A velocity: 89.40 cm/s MV E/A ratio:  0.66 Janeli Lewison Priya Rayma Hegg Electronically signed by Winfield Rast Twanna Resh Signature Date/Time: 09/17/2022/2:50:27 PM    Final    EEG adult  Result Date: 09/17/2022 Charlsie Quest, MD     09/17/2022  2:54 PM Patient Name: SHALENE GALLEN MRN: [id number] Epilepsy Attending: Charlsie Quest Referring Physician/Provider: Catarina Hartshorn, MD Date: 09/17/2022 Duration: 23.03 mins Patient history: 82yo M getting eeg to evaluate for seizure Level of alertness: Awake AEDs during EEG study: None Technical aspects: This EEG study was done with scalp electrodes positioned according to the 10-20 International system of electrode placement. Electrical activity was reviewed with band pass filter of 1-70Hz , sensitivity of 7 uV/mm, display speed of 48mm/sec with a 60Hz  notched  filter applied as appropriate. EEG data were recorded continuously and digitally stored.  Video monitoring was available and reviewed as appropriate. Description: The posterior dominant rhythm consists of 9-10 Hz activity of moderate voltage (25-35 uV) seen predominantly in posterior head regions, symmetric and reactive to eye opening and eye closing. Hyperventilation and photic stimulation were not performed.   IMPRESSION: This study is within normal limits. No seizures or epileptiform discharges were seen throughout the recording. A normal interictal EEG does not exclude the diagnosis of epilepsy. Charlsie Quest   MR BRAIN WO CONTRAST  Result Date: 09/17/2022 CLINICAL DATA:  82 year old female with recent chest and epigastric pain. Hypertension. Neurologic deficit. EXAM: MRI HEAD WITHOUT CONTRAST TECHNIQUE: Multiplanar, multiecho pulse sequences of the brain and surrounding structures were obtained without intravenous contrast. COMPARISON:  Brain MRI 05/25/2022. FINDINGS: Brain: Stable cerebral volume, normal for age. No restricted diffusion to suggest acute infarction. No midline shift, mass effect, evidence of mass lesion, ventriculomegaly, extra-axial collection or acute intracranial hemorrhage. Cervicomedullary junction and pituitary are within normal limits. Widely scattered small, also Patchy and confluent cerebral white matter T2 and FLAIR hyperintensity in both hemispheres appears stable since December. No superimposed cortical encephalomalacia or chronic cerebral blood products identified. 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 Otherwise the deep gray nuclei, brainstem, and cerebellum appear normal for age. Vascular: Major intracranial vascular flow voids are stable. Skull and upper cervical spine: Negative for age visible cervical spine. Hyperostosis frontalis, normal variant. Visualized bone marrow signal is within normal limits. Sinuses/Orbits: Stable,  negative. Other: Mastoids remain well aerated. Grossly normal visible internal auditory structures. Negative visible scalp and face. IMPRESSION: 1. No acute intracranial abnormality. 2. Stable noncontrast MRI appearance of the brain since December. Moderate for age mostly white matter signal changes, compatible with chronic small vessel disease. Electronically Signed   By: Odessa Fleming M.D.   On: 09/17/2022 10:56   CT Angio Chest/Abd/Pel for Dissection W and/or W/WO  Result Date: 09/16/2022 CLINICAL DATA:  Chest pain and epigastric pain. Extending to the left arm. Hypertension EXAM: CT ANGIOGRAPHY CHEST, ABDOMEN AND PELVIS TECHNIQUE: Non-contrast CT of the chest was initially obtained. Multidetector CT imaging through the chest, abdomen and pelvis was performed using the standard protocol during bolus administration of intravenous contrast. Multiplanar reconstructed images and MIPs were obtained and reviewed to evaluate the vascular anatomy. RADIATION DOSE REDUCTION: This exam was performed according to the departmental dose-optimization program which includes automated exposure control, adjustment of the mA and/or kV according to patient size and/or use of iterative reconstruction technique. CONTRAST:  OMNIPAQUE IOHEXOL 350 MG/ML SOLN COMPARISON:  CT angiogram chest 05/15/2021 and older. X-ray earlier 09/16/2022 and older FINDINGS: CTA CHEST FINDINGS Cardiovascular: There is mild-to-moderate partially calcified atherosclerotic plaque along the thoracic aorta. No increased density on noncontrast imaging to suggest an intramural hematoma. Small saccular outpouching extending to the left from the aortic arch. Penetrating ulcer or irregular plaque. Otherwise no dissection or aneurysm formation. Diameter of the aorta just above the aortic root up to 2.7 cm. Aortic arch has a diameter approaching 2.6 cm. The ascending aorta at the level of the right pulmonary artery measures 2.9 x 3.0 cm in the descending thoracic  aorta at the same level measures 2.5 x 2.5 cm. The heart is nonenlarged. No significant pericardial effusion. Coronary artery calcifications are seen. Mediastinum/Nodes: Slightly patulous thoracic esophagus. Preserved thyroid gland. No specific abnormal lymph node enlargement identified in the axillary regions, hilum or mediastinum. Lungs/Pleura: There is some linear opacity lung bases likely scar or atelectasis. No consolidation, pneumothorax or effusion. 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 Musculoskeletal: Fixation hardware seen along the lower cervical spine the edge of the imaging field. Scattered degenerative changes of the thoracic spine. Global osteopenia. Review of the MIP images confirms the above findings. CTA ABDOMEN AND PELVIS FINDINGS VASCULAR Aorta: Moderate calcified plaque identified along the abdominal aorta without dissection or aneurysm formation. Celiac: Mild plaque along the celiac.  Standard branching pattern. SMA: Patent SMA origin. Renals: There is some atherosclerotic plaque along the origin of the renal arteries. Moderate stenosis on the left at the origin. There is moderate atrophy of the left kidney. IMA: Focally occluded. Inflow: Mild-to-moderate atherosclerotic partially calcified plaque along the common iliac arteries. Only mild areas of stenosis, less than 50%. Less significant disease along the external iliac arteries. Moderate along the internal iliac with areas of significant ostial stenosis. Veins: No obvious venous abnormality within the limitations of this arterial phase study. Review of the MIP images confirms the above findings. NON-VASCULAR Hepatobiliary: With the limits of the arterial phase of the contrast bolus, the liver has some small low-attenuation lesions consistent with benign cysts. Example right hepatic lobe segment 7 measuring 13  mm in diameter with Hounsfield unit of 14. No specific imaging follow-up. Portal vein is patent. Gallbladder is  mildly distended. Pancreas: Unremarkable. No pancreatic ductal dilatation or surrounding inflammatory changes. Spleen: Normal in size without focal abnormality. Adrenals/Urinary Tract: The adrenal glands are preserved. There is moderate atrophy of the left kidney and mild right. Small cystic foci are noted along each kidney, Bosniak 1 and 2 lesions. No specific imaging follow up Stomach/Bowel: There is fluid along the nondilated stomach. Small and large bowel are nondilated. Scattered colonic stool. Overall moderate stool burden. Few colonic diverticula. Normal retrocecal appendix in the right hemipelvis Lymphatic: No developing abnormal lymph node enlargement identified in the abdomen and pelvis. Reproductive: Prostate is unremarkable. Other: Mild anasarca.  No free air or free fluid. Musculoskeletal: Scattered degenerative changes of the spine and pelvis. Review of the MIP images confirms the above findings. IMPRESSION: Moderate partially calcified atherosclerotic plaque overall. No dissection or aneurysm formation. Focal stenosis seen of the left renal artery with moderate atrophy of the left kidney. Please correlate for level of hypertension. Occluded inferior mesenteric artery. Colonic diverticula.  Moderate colonic stool. No obstruction, free air or free fluid. Centrilobular emphysematous lung changes. Electronically Signed   By: Karen Kays M.D.   On: 09/16/2022 15:07   DG Chest Port 1 View  Result Date: 09/16/2022 CLINICAL DATA:  Chest pain. EXAM: PORTABLE CHEST 1 VIEW COMPARISON:  One-view chest x-ray 03/25/2022 FINDINGS: The heart size is normal. Atherosclerotic calcifications are present at the aortic arch. And chronic bronchitic changes are most evident the lung apices. No superimposed acute disease is present. No edema or effusion is present. No focal airspace disease is present. IMPRESSION: 1. No acute cardiopulmonary disease. 2. Chronic bronchitic changes. Electronically Signed   By: Marin Roberts M.D.   On: 09/16/2022 12:22    Cardiac Studies   Echocardiogram on 09/17/2022 LVEF more than 75% G1 DD RV systolic function is normal CVP 3 mmHg  Assessment & Plan   Patient is a 82 year old F known to have poorly controlled HTN, COPD, nicotine abuse in remission is currently admitted to hospitalist team for the management of hypertensive emergency.   # Hypertensive emergency  -Nicardipine drip is weaned off, continue amlodipine 10 mg once daily, hydralazine 50 mg three times daily and continue ISDN 20 mg three times daily.  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 # COPD: Management per primary team  [facility] will sign off.   Medication Recommendations: Continue amlodipine 10 mg once daily, hydralazine 50 mg 3 times daily and isosorbide dinitrate 20 mg 3 times daily. Other recommendations (labs, testing, etc): None Follow up as an outpatient: Cardiology follow-up in 1 month upon discharge   I have spent a total of 30 minutes with patient reviewing chart , telemetry, EKGs, labs and examining patient as well as establishing an assessment and plan that was discussed with the patient.  > 50% of time was spent in direct patient care.    Signed, Marjo Bicker, MD  09/18/2022, 10:34 AM

## 2022-09-18 NOTE — Progress Notes (Signed)
PROGRESS NOTE  [name] ZOX:[id number] DOB: Oct 15, 1940 DOA: 09/16/2022 PCP: Mechele Claude, MD  Brief History:  82 year old female with a history of TIA/stroke, hypertension, hypertensive cephalopathy, tobacco abuse in remission, COPD presenting with a constellation of symptoms including chest pain, elevated blood pressure, and transient altered vision.  The patient was drinking her morning coffee when she began having chest discomfort that was pressure-like radiate down to her left arm.  She checked her blood pressure and her systolic blood pressure was 199.  As result EMS was activated.  The patient has some dizziness and nausea.  She denies syncope.  She denied any shortness of breath, palpitations or diaphoresis.  She is compliant with her medications.  Around the same time, the patient had some alterations in her vision.  She felt like she was seeing some orange spots and felt like her visual fields were discolored.  She states that she has had this type sensation in the past.  She was awake and alert and lucid during the entire episode.  She stated that her chest discomfort lasted about an hour.   While she was in the emergency department, she had an episode of whole body pain from the tip of her head to her toes.  During this episode, the patient felt like she was having difficulty moving her legs.  Again she was awake and communicative. She was started on nicardipine drip and admitted for further evaluation and treatment. ED Course: Temperature 98.1.  Heart rate 60s to 102.  Respirate rate 14-29.  Blood pressure systolic initially up to 237.  Given total of 50 mg of hydralazine without improvement. EKG shows sinus rhythm rate 70, no acute abnormalities.  Troponin 11 > 13. CTA chest/Abd/Pelvis- shows Moderate partially calcified atherosclerotic plaque overall. No dissection or aneurysm formation. Focal stenosis seen of the left renal artery with moderate atrophy of the left  kidney. Please correlate for level of hypertension. Occluded inferior mesenteric artery.   Nicardipine drip started with improvement in blood pressure down to 140s systolic.   EDP talked to Vascular Surg- Dr. Chestine Spore who states that there is good flow in the celiac trunk and the SMA and the patient would not require aggressive intervention for the IMA.  In regards to the renal artery stenosis, this can be followed outpatient in clinic.      Assessment/Plan: Hypertensive urgency -SBP up to 221/88 -remains on nicardipine drip -Wean off nicardipine drip as oral antihypertensive medications were introduced -Restart amlodipine, hydralazine -Started low-dose bisoprolol -ISDN started  -Weaned nicardipine drip off  AKI on CKD 3a -baseline creatinine 0.9-1.2 -serum creatinine peaking 1.85 -due to contrast -anticipate slight worsening before improvement -avoid nephrotoxins   Chest pain -Troponins 11>13 -Cardiology consult appreciated -CT coronary 03/30/22-diagnosed with moderate atherosclerosis, symptom guided anti-ischemic pharmacotherapy and risk factor modification recommended.  Subsequent coronary CT FFR analysis demonstrated no significant flow-limiting lesions.  Follows with cardiology.  -Resume Plavix and statin   Left renal artery stenosis -CTA chest abdomen and pelvis - focal stenosis seen of the left renal artery with moderate atrophy of the left kidney, occluded inferior mesenteric artery. - EDP spoke with the vascular surgeon on-call Dr. Chestine Spore - there is good flow in the celiac trunk and the SMA and the patient would not require aggressive intervention for the IMA.  In regards to the renal artery stenosis, this can be followed as outpatient,  But is likely driving some of this hypertension.  COPD -Stable on room air -Restart home Yupelri and Pulmicort -Continue albuterol   Hypokalemia -replete -check mag 2.3    Back pain -xray lumbar--no fracture -R-pelvis--no fracture              Family Communication:  no Family at bedside   Consultants:  cardiology   Code Status:  FULL    DVT Prophylaxis:  [place] Lovenox     Procedures: As Listed in Progress Note Above   Antibiotics: None           Subjective: Patient denies fevers, chills, headache, chest pain, dyspnea, nausea, vomiting, diarrhea, abdominal pain, dysuria, hematuria, hematochezia, and melena.   Objective: Vitals:   09/18/22 0700 09/18/22 0827 09/18/22 0933 09/18/22 1359  BP:  (!) 117/54 133/62 (!) 107/47  Pulse:  77 85 77  Resp:  18  16  Temp:  98.5 F (36.9 C)  98.4 F (36.9 C)  TempSrc:  Oral    SpO2: 98% 98%  98%  Weight:      Height:        Intake/Output Summary (Last 24 hours) at 09/18/2022 1808 Last data filed at 09/18/2022 1300 Gross per 24 hour  Intake 240 ml  Output --  Net 240 ml   Weight change:  Exam:  General:  Pt is alert, follows commands appropriately, not in acute distress HEENT: No icterus, No thrush, No neck mass, [place]/AT Cardiovascular: RRR, S1/S2, no rubs, no gallops Respiratory: bibasilar rales.  No wheeze Abdomen: Soft/+BS, non tender, non distended, no guarding Extremities: No edema, No lymphangitis, No petechiae, No rashes, no synovitis   Data Reviewed: I have personally reviewed following labs and imaging studies Basic Metabolic Panel: Recent Labs  Lab 09/16/22 1214 09/17/22 0556 09/18/22 0457  NA 141 137 134*  K 4.0 3.4* 3.7  CL 102 102 101  CO2 30 26 26   GLUCOSE 93 107* 109*  BUN 17 16 32*  CREATININE 0.99 0.98 1.85*  CALCIUM 8.9 8.6* 8.4*  MG 2.3  --  2.3   Liver Function Tests: No results for input(s): "AST", "ALT", "ALKPHOS", "BILITOT", "PROT", "ALBUMIN" in the last 168 hours. No results for input(s): "LIPASE", "AMYLASE" in the last 168 hours. No results for input(s): "AMMONIA" in the last 168 hours. Coagulation Profile: No results for input(s): "INR", "PROTIME" in the last 168 hours. CBC: Recent Labs  Lab  09/16/22 1214 09/17/22 0556  WBC 4.6 6.1  HGB 14.7 14.3  HCT 45.6 44.3  MCV 95.2 93.3  PLT 157 174   Cardiac Enzymes: Recent Labs  Lab 09/17/22 0941  CKTOTAL 132   BNP: Invalid input(s): "POCBNP" CBG: No results for input(s): "GLUCAP" in the last 168 hours. HbA1C: No results for input(s): "HGBA1C" in the last 72 hours. Urine analysis:    Component Value Date/Time   COLORURINE YELLOW 01/22/2020 1500   APPEARANCEUR CLEAR 01/22/2020 1500   LABSPEC 1.045 (H) 01/22/2020 1500   PHURINE 5.0 01/22/2020 1500   GLUCOSEU NEGATIVE 01/22/2020 1500   HGBUR NEGATIVE 01/22/2020 1500   BILIRUBINUR NEGATIVE 01/22/2020 1500   KETONESUR 5 (A) 01/22/2020 1500   PROTEINUR NEGATIVE 01/22/2020 1500   UROBILINOGEN 0.2 02/11/2012 1600   NITRITE NEGATIVE 01/22/2020 1500   LEUKOCYTESUR NEGATIVE 01/22/2020 1500   Sepsis Labs: @LABRCNTIP (procalcitonin:4,lacticidven:4) ) Recent Results (from the past 240 hour(s))  MRSA Next Gen by PCR, Nasal     Status: None   Collection Time: 09/17/22  3:40 PM   Specimen: Nasal Mucosa; Nasal Swab  Result Value Ref Range  Status   MRSA by PCR Next Gen NOT DETECTED NOT DETECTED Final    Comment: (NOTE) The GeneXpert MRSA Assay (FDA approved for NASAL specimens only), is one component of a comprehensive MRSA colonization surveillance program. It is not intended to diagnose MRSA infection nor to guide or monitor treatment for MRSA infections. Test performance is not FDA approved in patients less than 52 years old. Performed at [facility], [address], [place], [place] [address]      Scheduled Meds:  acetaminophen  1,000 mg Oral Q6H   albuterol  2.5 mg Nebulization BID   amLODipine  10 mg Oral Daily   arformoterol  15 mcg Nebulization BID   atorvastatin  80 mg Oral Daily   bisoprolol  5 mg Oral Daily   budesonide (PULMICORT) nebulizer solution  0.5 mg Nebulization BID   Chlorhexidine Gluconate Cloth  6 each Topical Daily   clopidogrel  75 mg Oral  Daily   enoxaparin (LOVENOX) injection  30 mg Subcutaneous Q24H   hydrALAZINE  50 mg Oral TID   isosorbide dinitrate  20 mg Oral TID   revefenacin  175 mcg Nebulization Daily   vitamin B-12  500 mcg Oral Daily   Continuous Infusions:  niCARDipine 3 mg/hr (09/17/22 0843)    Procedures/Studies: DG Lumbar Spine 2-3 Views  Result Date: 09/18/2022 CLINICAL DATA:  Lower back and right hip pain. EXAM: LUMBAR SPINE - 2-3 VIEW COMPARISON:  Lumbar spine radiographs 01/26/2019 FINDINGS: There are 5 non-rib-bearing lumbar-type vertebral bodies. Normal frontal alignment. No sagittal spondylolisthesis. Vertebral body heights are maintained. Mild L5-S1 greater than L4-5 posterior disc space narrowing. Moderate to severe L5-S1 greater than L4-5 facet joint hypertrophy and sclerosis. Moderate to high-grade atherosclerotic calcifications. IMPRESSION: Mild L5-S1 greater than L4-5 degenerative disc changes. High-grade L5-S1 greater than L4-5 facet joint degenerative changes. Electronically Signed   By: Neita Garnet M.D.   On: 09/18/2022 10:52   DG HIP UNILAT WITH PELVIS 2-3 VIEWS RIGHT  Result Date: 09/18/2022 CLINICAL DATA:  Lower back and right hip pain. EXAM: DG HIP (WITH OR WITHOUT PELVIS) 2-3V RIGHT COMPARISON:  CT chest, abdomen, and pelvis 09/16/2022 FINDINGS: There is diffuse decreased bone mineralization. Mild bilateral sacroiliac subchondral sclerosis. Severe pubic symphysis joint space narrowing and subchondral sclerosis with mild-to-moderate peripheral superior degenerative osteophytes. Moderate bilateral femoroacetabular joint space narrowing with mild-to-moderate bilateral superolateral acetabular degenerative osteophytes. No acute fracture is seen. No dislocation. Moderate atherosclerotic calcifications. IMPRESSION: 1. Moderate bilateral femoroacetabular osteoarthritis. 2. Severe pubic symphysis osteoarthritis. Electronically Signed   By: Neita Garnet M.D.   On: 09/18/2022 10:49   ECHOCARDIOGRAM  COMPLETE  Result Date: 09/17/2022    ECHOCARDIOGRAM REPORT   Patient Name:   [name] Date of Exam: 09/17/2022 Medical Rec #:  [id number]        Height:       61.0 in Accession #:    [id number]       Weight:       132.7 lb Date of Birth:  1940/08/28         BSA:          1.587 m Patient Age:    81 years         BP:           154/55 mmHg Patient Gender: F                HR:           78 bpm. Exam [facility] Procedure: 2D Echo, Cardiac Doppler and Color Doppler Indications:    Chest Pain R07.9  History:        Patient has prior history of Echocardiogram examinations, most                 recent 02/12/2012. COPD, TIA and Stroke; Risk                 Factors:Hypertension and Dyslipidemia. Hx of Tobacco abuse.  Sonographer:    Celesta Gentile RCS Referring Phys: [phone] Gaile Allmon IMPRESSIONS  1. Left ventricular ejection fraction, by estimation, is >75%. The left ventricle has hyperdynamic function. The left ventricle has no regional wall motion abnormalities. Left ventricular diastolic parameters are consistent with Grade I diastolic dysfunction (impaired relaxation).  2. Right ventricular systolic function is normal. The right ventricular size is normal. Tricuspid regurgitation signal is inadequate for assessing PA pressure.  3. The mitral valve is normal in structure. No evidence of mitral valve regurgitation. No evidence of mitral stenosis.  4. The aortic valve was not well visualized. Aortic valve regurgitation is not visualized. No aortic stenosis is present.  5. The inferior vena cava is normal in size with greater than 50% respiratory variability, suggesting right atrial pressure of 3 mmHg. Comparison(s): No prior Echocardiogram. FINDINGS  Left Ventricle: Left ventricular ejection fraction, by estimation, is >75%. The left ventricle has hyperdynamic function. The left ventricle has no regional wall motion abnormalities. The left ventricular internal cavity size was normal in size. There is no left  ventricular hypertrophy. Left ventricular diastolic parameters are consistent with Grade I diastolic dysfunction (impaired relaxation). Right Ventricle: The right ventricular size is normal. No increase in right ventricular wall thickness. Right ventricular systolic function is normal. Tricuspid regurgitation signal is inadequate for assessing PA pressure. Left Atrium: Left atrial size was normal in size. Right Atrium: Right atrial size was normal in size. Pericardium: There is no evidence of pericardial effusion. Mitral Valve: The mitral valve is normal in structure. There is mild thickening of the mitral valve leaflet(s). There is mild calcification of the mitral valve leaflet(s). No evidence of mitral valve regurgitation. No evidence of mitral valve stenosis. Tricuspid Valve: The tricuspid valve is normal in structure. Tricuspid valve regurgitation is not demonstrated. No evidence of tricuspid stenosis. Aortic Valve: The aortic valve was not well visualized. Aortic valve regurgitation is not visualized. No aortic stenosis is present. Pulmonic Valve: The pulmonic valve was not well visualized. Pulmonic valve regurgitation is not visualized. No evidence of pulmonic stenosis. Aorta: The aortic root is normal in size and structure. Venous: The inferior vena cava is normal in size with greater than 50% respiratory variability, suggesting right atrial pressure of 3 mmHg. IAS/Shunts: No atrial level shunt detected by color flow Doppler.  LEFT VENTRICLE PLAX 2D LVIDd:         4.10 cm   Diastology LVIDs:         2.00 cm   LV e' medial:    6.74 cm/s LV PW:         1.00 cm   LV E/e' medial:  8.7 LV IVS:        1.00 cm   LV e' lateral:   7.40 cm/s LVOT diam:     1.80 cm   LV E/e' lateral: 7.9 LV SV:         67 LV SV Index:   43 LVOT Area:     2.54 cm  RIGHT VENTRICLE RV S prime:  12.80 cm/s TAPSE (M-mode): 2.5 cm LEFT ATRIUM             Index        RIGHT ATRIUM           Index LA diam:        3.10 cm 1.95 cm/m   RA  Area:     15.50 cm LA Vol (A2C):   50.7 ml 31.95 ml/m  RA Volume:   42.00 ml  26.47 ml/m LA Vol (A4C):   48.6 ml 30.63 ml/m LA Biplane Vol: 50.0 ml 31.51 ml/m  AORTIC VALVE LVOT Vmax:   130.00 cm/s LVOT Vmean:  93.200 cm/s LVOT VTI:    0.265 m  AORTA Ao Root diam: 2.80 cm MITRAL VALVE MV Area (PHT): 2.95 cm    SHUNTS MV Decel Time: 257 msec    Systemic VTI:  0.26 m MV E velocity: 58.70 cm/s  Systemic Diam: 1.80 cm MV A velocity: 89.40 cm/s MV E/A ratio:  0.66 Vishnu Priya Mallipeddi Electronically signed by Winfield Rast Mallipeddi Signature Date/Time: 09/17/2022/2:50:27 PM    Final    EEG adult  Result Date: 09/17/2022 Charlsie Quest, MD     09/17/2022  2:54 PM Patient Name: [name] MRN: [id number] Epilepsy Attending: Charlsie Quest Referring Physician/Provider: Catarina Hartshorn, MD Date: 09/17/2022 Duration: 23.03 mins Patient history: 82yo M getting eeg to evaluate for seizure Level of alertness: Awake AEDs during EEG study: None Technical aspects: This EEG study was done with scalp electrodes positioned according to the 10-20 International system of electrode placement. Electrical activity was reviewed with band pass filter of 1-70Hz , sensitivity of 7 uV/mm, display speed of 41mm/sec with a  notched filter applied as appropriate. EEG data were recorded continuously and digitally stored.  Video monitoring was available and reviewed as appropriate. Description: The posterior dominant rhythm consists of 9-10 Hz activity of moderate voltage (25-35 uV) seen predominantly in posterior head regions, symmetric and reactive to eye opening and eye closing. Hyperventilation and photic stimulation were not performed.   IMPRESSION: This study is within normal limits. No seizures or epileptiform discharges were seen throughout the recording. A normal interictal EEG does not exclude the diagnosis of epilepsy. Charlsie Quest   MR BRAIN WO CONTRAST  Result Date: 09/17/2022 CLINICAL DATA:  82 year old  female with recent chest and epigastric pain. Hypertension. Neurologic deficit. EXAM: MRI HEAD WITHOUT CONTRAST TECHNIQUE: Multiplanar, multiecho pulse sequences of the brain and surrounding structures were obtained without intravenous contrast. COMPARISON:  Brain MRI 05/25/2022. FINDINGS: Brain: Stable cerebral volume, normal for age. No restricted diffusion to suggest acute infarction. No midline shift, mass effect, evidence of mass lesion, ventriculomegaly, extra-axial collection or acute intracranial hemorrhage. Cervicomedullary junction and pituitary are within normal limits. Widely scattered small, also Patchy and confluent cerebral white matter T2 and FLAIR hyperintensity in both hemispheres appears stable since December. No superimposed cortical encephalomalacia or chronic cerebral blood products identified. Small chronic lacunar infarct at the right caudothalamic groove appears stable. Otherwise the deep gray nuclei, brainstem, and cerebellum appear normal for age. Vascular: Major intracranial vascular flow voids are stable. Skull and upper cervical spine: Negative for age visible cervical spine. Hyperostosis frontalis, normal variant. Visualized bone marrow signal is within normal limits. Sinuses/Orbits: Stable, negative. Other: Mastoids remain well aerated. Grossly normal visible internal auditory structures. Negative visible scalp and face. IMPRESSION: 1. No acute intracranial abnormality. 2. Stable noncontrast MRI appearance of the brain since December. Moderate for age mostly white matter  signal changes, compatible with chronic small vessel disease. Electronically Signed   By: Odessa Fleming M.D.   On: 09/17/2022 10:56   CT Angio Chest/Abd/Pel for Dissection W and/or W/WO  Result Date: 09/16/2022 CLINICAL DATA:  Chest pain and epigastric pain. Extending to the left arm. Hypertension EXAM: CT ANGIOGRAPHY CHEST, ABDOMEN AND PELVIS TECHNIQUE: Non-contrast CT of the chest was initially obtained. Multidetector  CT imaging through the chest, abdomen and pelvis was performed using the standard protocol during bolus administration of intravenous contrast. Multiplanar reconstructed images and MIPs were obtained and reviewed to evaluate the vascular anatomy. RADIATION DOSE REDUCTION: This exam was performed according to the departmental dose-optimization program which includes automated exposure control, adjustment of the mA and/or kV according to patient size and/or use of iterative reconstruction technique. CONTRAST:  OMNIPAQUE IOHEXOL 350 MG/ML SOLN COMPARISON:  CT angiogram chest 05/15/2021 and older. X-ray earlier 09/16/2022 and older FINDINGS: CTA CHEST FINDINGS Cardiovascular: There is mild-to-moderate partially calcified atherosclerotic plaque along the thoracic aorta. No increased density on noncontrast imaging to suggest an intramural hematoma. Small saccular outpouching extending to the left from the aortic arch. Penetrating ulcer or irregular plaque. Otherwise no dissection or aneurysm formation. Diameter of the aorta just above the aortic root up to 2.7 cm. Aortic arch has a diameter approaching 2.6 cm. The ascending aorta at the level of the right pulmonary artery measures 2.9 x 3.0 cm in the descending thoracic aorta at the same level measures 2.5 x 2.5 cm. The heart is nonenlarged. No significant pericardial effusion. Coronary artery calcifications are seen. Mediastinum/Nodes: Slightly patulous thoracic esophagus. Preserved thyroid gland. No specific abnormal lymph node enlargement identified in the axillary regions, hilum or mediastinum. Lungs/Pleura: There is some linear opacity lung bases likely scar or atelectasis. No consolidation, pneumothorax or effusion. 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 Musculoskeletal: Fixation hardware seen along the lower cervical spine the edge of the imaging field. Scattered degenerative changes of the thoracic spine. Global osteopenia. Review of the  MIP images confirms the above findings. CTA ABDOMEN AND PELVIS FINDINGS VASCULAR Aorta: Moderate calcified plaque identified along the abdominal aorta without dissection or aneurysm formation. Celiac: Mild plaque along the celiac.  Standard branching pattern. SMA: Patent SMA origin. Renals: There is some atherosclerotic plaque along the origin of the renal arteries. Moderate stenosis on the left at the origin. There is moderate atrophy of the left kidney. IMA: Focally occluded. Inflow: Mild-to-moderate atherosclerotic partially calcified plaque along the common iliac arteries. Only mild areas of stenosis, less than 50%. Less significant disease along the external iliac arteries. Moderate along the internal iliac with areas of significant ostial stenosis. Veins: No obvious venous abnormality within the limitations of this arterial phase study. Review of the MIP images confirms the above findings. NON-VASCULAR Hepatobiliary: With the limits of the arterial phase of the contrast bolus, the liver has some small low-attenuation lesions consistent with benign cysts. Example right hepatic lobe segment 7 measuring 13 mm in diameter with Hounsfield unit of 14. No specific imaging follow-up. Portal vein is patent. Gallbladder is mildly distended. Pancreas: Unremarkable. No pancreatic ductal dilatation or surrounding inflammatory changes. Spleen: Normal in size without focal abnormality. Adrenals/Urinary Tract: The adrenal glands are preserved. There is moderate atrophy of the left kidney and mild right. Small cystic foci are noted along each kidney, Bosniak 1 and 2 lesions. No specific imaging follow up Stomach/Bowel: There is fluid along the nondilated stomach. Small and large bowel are nondilated. Scattered colonic stool. Overall moderate stool  burden. Few colonic diverticula. Normal retrocecal appendix in the right hemipelvis Lymphatic: No developing abnormal lymph node enlargement identified in the abdomen and pelvis.  Reproductive: Prostate is unremarkable. Other: Mild anasarca.  No free air or free fluid. Musculoskeletal: Scattered degenerative changes of the spine and pelvis. Review of the MIP images confirms the above findings. IMPRESSION: Moderate partially calcified atherosclerotic plaque overall. No dissection or aneurysm formation. Focal stenosis seen of the left renal artery with moderate atrophy of the left kidney. Please correlate for level of hypertension. Occluded inferior mesenteric artery. Colonic diverticula.  Moderate colonic stool. No obstruction, free air or free fluid. Centrilobular emphysematous lung changes. Electronically Signed   By: Karen Kays M.D.   On: 09/16/2022 15:07   DG Chest Port 1 View  Result Date: 09/16/2022 CLINICAL DATA:  Chest pain. EXAM: PORTABLE CHEST 1 VIEW COMPARISON:  One-view chest x-ray 03/25/2022 FINDINGS: The heart size is normal. Atherosclerotic calcifications are present at the aortic arch. And chronic bronchitic changes are most evident the lung apices. No superimposed acute disease is present. No edema or effusion is present. No focal airspace disease is present. IMPRESSION: 1. No acute cardiopulmonary disease. 2. Chronic bronchitic changes. Electronically Signed   By: Marin Roberts M.D.   On: 09/16/2022 12:22   US Carotid Duplex Bilateral  Result Date: 08/25/2022 CLINICAL DATA:  Amaurosis fugax. History of stroke (2001), hypertension. Former smoker. EXAM: BILATERAL CAROTID DUPLEX ULTRASOUND TECHNIQUE: Wallace Cullens scale imaging, color Doppler and duplex ultrasound were performed of bilateral carotid and vertebral arteries in the neck. COMPARISON:  None Available. FINDINGS: Criteria: Quantification of carotid stenosis is based on velocity parameters that correlate the residual internal carotid diameter with NASCET-based stenosis levels, using the diameter of the distal internal carotid lumen as the denominator for stenosis measurement. The following velocity measurements  were obtained: RIGHT ICA: 112/19 cm/sec CCA: 99/10 cm/sec SYSTOLIC ICA/CCA RATIO:  1.1 ECA: 34 cm/sec LEFT ICA: 62/12 cm/sec CCA: 59/8 cm/sec SYSTOLIC ICA/CCA RATIO:  1.1 ECA: 67 cm/sec RIGHT CAROTID ARTERY: There is a minimal amount of eccentric echogenic plaque scattered throughout the right common carotid artery (image 10). There is a moderate to large amount of eccentric echogenic plaque involving the right carotid bulb (image 19), extending to involve the origin and proximal aspects of the right internal carotid artery (image 27), not resulting in elevated peak systolic velocities within the interrogated course of the right internal carotid artery to suggest a hemodynamically significant stenosis. RIGHT VERTEBRAL ARTERY:  Antegrade flow LEFT CAROTID ARTERY: There is a minimal amount of eccentric echogenic plaque within the left carotid bulb (image 55), extending to involve the origin and proximal aspects of the left internal carotid artery (image 63), not resulting in elevated peak systolic velocities within the interrogated course of the left internal carotid artery to suggest a hemodynamically significant stenosis. LEFT VERTEBRAL ARTERY:  Antegrade flow IMPRESSION: 1. Moderate-to-large amount of right-sided atherosclerotic plaque, not resulting in a hemodynamically significant stenosis. 2. Minimal amount of left-sided atherosclerotic plaque, not resulting in a hemodynamically significant stenosis. Electronically Signed   By: Simonne Come M.D.   On: 08/25/2022 08:49    Catarina Hartshorn, DO  [facility]  If 7PM-7AM, please contact night-coverage [email or web address] Password [email or web address] 09/18/2022, 6:08 PM   LOS: 2 days

## 2022-09-18 NOTE — Telephone Encounter (Signed)
Called patient to schedule Medicare Annual Wellness Visit (AWV). Unable to reach patient.  Patient is currently in the Hospital  If any questions, please contact me at [phone].  Thank you,  Judeth Cornfield,  AMB Clinical Support [facility] AWV Program Direct Dial ??[phone]

## 2022-09-19 DIAGNOSIS — I701 Atherosclerosis of renal artery: Secondary | ICD-10-CM | POA: Diagnosis not present

## 2022-09-19 DIAGNOSIS — N179 Acute kidney failure, unspecified: Secondary | ICD-10-CM

## 2022-09-19 DIAGNOSIS — R0789 Other chest pain: Secondary | ICD-10-CM | POA: Diagnosis not present

## 2022-09-19 DIAGNOSIS — I160 Hypertensive urgency: Secondary | ICD-10-CM | POA: Diagnosis not present

## 2022-09-19 LAB — URINALYSIS, COMPLETE (UACMP) WITH MICROSCOPIC
Bilirubin Urine: NEGATIVE
Glucose, UA: NEGATIVE mg/dL
Hgb urine dipstick: NEGATIVE
Ketones, ur: NEGATIVE mg/dL
Leukocytes,Ua: NEGATIVE
Nitrite: NEGATIVE
Protein, ur: NEGATIVE mg/dL
Specific Gravity, Urine: 1.011 (ref 1.005–1.030)
pH: 5 (ref 5.0–8.0)

## 2022-09-19 LAB — BASIC METABOLIC PANEL
Anion gap: 7 (ref 5–15)
BUN: 35 mg/dL — ABNORMAL HIGH (ref 8–23)
CO2: 25 mmol/L (ref 22–32)
Calcium: 8.3 mg/dL — ABNORMAL LOW (ref 8.9–10.3)
Chloride: 102 mmol/L (ref 98–111)
Creatinine, Ser: 1.77 mg/dL — ABNORMAL HIGH (ref 0.44–1.00)
GFR, Estimated: 29 mL/min — ABNORMAL LOW (ref >60)
Glucose, Bld: 96 mg/dL (ref 70–99)
Potassium: 3.7 mmol/L (ref 3.5–5.1)
Sodium: 134 mmol/L — ABNORMAL LOW (ref 135–145)

## 2022-09-19 MED ORDER — ALBUTEROL SULFATE (2.5 MG/3ML) 0.083% IN NEBU: 2.5000 mg | INHALATION_SOLUTION | Freq: Four times a day (QID) | RESPIRATORY_TRACT | Status: DC | PRN

## 2022-09-19 MED ORDER — ONDANSETRON HCL 4 MG/2ML IJ SOLN
4.0000 mg | Freq: Four times a day (QID) | INTRAMUSCULAR | Status: DC | PRN
  Administered 2022-09-19: 4 mg via INTRAVENOUS
  Filled 2022-09-19: qty 2

## 2022-09-19 NOTE — Progress Notes (Signed)
PROGRESS NOTE  [name] ZOX:[id number] DOB: Sep 28, 1940 DOA: 09/16/2022 PCP: Mechele Claude, MD  Brief History:  82 year old female with a history of TIA/stroke, hypertension, hypertensive cephalopathy, tobacco abuse in remission, COPD presenting with a constellation of symptoms including chest pain, elevated blood pressure, and transient altered vision.  The patient was drinking her morning coffee when she began having chest discomfort that was pressure-like radiate down to her left arm.  She checked her blood pressure and her systolic blood pressure was 199.  As result EMS was activated.  The patient has some dizziness and nausea.  She denies syncope.  She denied any shortness of breath, palpitations or diaphoresis.  She is compliant with her medications.  Around the same time, the patient had some alterations in her vision.  She felt like she was seeing some orange spots and felt like her visual fields were discolored.  She states that she has had this type sensation in the past.  She was awake and alert and lucid during the entire episode.  She stated that her chest discomfort lasted about an hour.   While she was in the emergency department, she had an episode of whole body pain from the tip of her head to her toes.  During this episode, the patient felt like she was having difficulty moving her legs.  Again she was awake and communicative. She was started on nicardipine drip and admitted for further evaluation and treatment. ED Course: Temperature 98.1.  Heart rate 60s to 102.  Respirate rate 14-29.  Blood pressure systolic initially up to 237.  Given total of 50 mg of hydralazine without improvement. EKG shows sinus rhythm rate 70, no acute abnormalities.  Troponin 11 > 13. CTA chest/Abd/Pelvis- shows Moderate partially calcified atherosclerotic plaque overall. No dissection or aneurysm formation. Focal stenosis seen of the left renal artery with moderate atrophy of the left  kidney. Please correlate for level of hypertension. Occluded inferior mesenteric artery.   Nicardipine drip started with improvement in blood pressure down to 140s systolic.   EDP talked to Vascular Surg- Dr. Chestine Spore who states that there is good flow in the celiac trunk and the SMA and the patient would not require aggressive intervention for the IMA.  In regards to the renal artery stenosis, this can be followed outpatient in clinic.      Assessment/Plan:  Hypertensive urgency -SBP up to 221/88 -remains on nicardipine drip -Wean off nicardipine drip as oral antihypertensive medications were introduced -Restart amlodipine, hydralazine -Started low-dose bisoprolol -ISDN started  -Weaned nicardipine drip off   AKI on CKD 3a -baseline creatinine 0.9-1.2 -serum creatinine peaking 1.85 -due to contrast -serum creatinine appears to be reaching a plateau -avoid nephrotoxins   Chest pain -Troponins 11>13 -likely related to HTN urgency -Cardiology consult appreciated -CT coronary 03/30/22-diagnosed with moderate atherosclerosis, symptom guided anti-ischemic pharmacotherapy and risk factor modification recommended.  Subsequent coronary CT FFR analysis demonstrated no significant flow-limiting lesions.  Follows with cardiology.  -Resume Plavix and statin   Left renal artery stenosis -CTA chest abdomen and pelvis - focal stenosis seen of the left renal artery with moderate atrophy of the left kidney, occluded inferior mesenteric artery. - EDP spoke with the vascular surgeon on-call Dr. Chestine Spore - there is good flow in the celiac trunk and the SMA and the patient would not require aggressive intervention for the IMA.  In regards to the renal artery stenosis, this can be followed as outpatient,  But is likely driving some of this hypertension.    COPD -Stable on room air -Restarted home Yupelri and Pulmicort -Continue albuterol   Hypokalemia -replete -check mag 2.3    Back pain -xray  lumbar--no fracture -R-pelvis--no fracture -no red flags             Family Communication:  no Family at bedside   Consultants:  cardiology   Code Status:  FULL    DVT Prophylaxis:  Melvin Lovenox     Procedures: As Listed in Progress Note Above   Antibiotics: None                  Subjective: Patient denies fevers, chills, headache, chest pain, dyspnea, nausea, vomiting, diarrhea, abdominal pain, dysuria, hematuria, hematochezia, and melena.   Objective: Vitals:   09/19/22 0439 09/19/22 0658 09/19/22 1200 09/19/22 1340  BP: 138/63  (!) 109/52 (!) 99/52  Pulse: 75  79 72  Resp: 18  16   Temp: 98.1 F (36.7 C)  98.8 F (37.1 C) 98.3 F (36.8 C)  TempSrc: Oral  Oral Oral  SpO2: 95% 96% 97% 95%  Weight: 61.4 kg     Height:        Intake/Output Summary (Last 24 hours) at 09/19/2022 1651 Last data filed at 09/19/2022 1300 Gross per 24 hour  Intake 360 ml  Output --  Net 360 ml   Weight change:  Exam:  General:  Pt is alert, follows commands appropriately, not in acute distress HEENT: No icterus, No thrush, No neck mass, [place]/AT Cardiovascular: RRR, S1/S2, no rubs, no gallops Respiratory: bibasilar rales. No wheeze Abdomen: Soft/+BS, non tender, non distended, no guarding Extremities: No edema, No lymphangitis, No petechiae, No rashes, no synovitis   Data Reviewed: I have personally reviewed following labs and imaging studies Basic Metabolic Panel: Recent Labs  Lab 09/16/22 1214 09/17/22 0556 09/18/22 0457 09/19/22 0456  NA 141 137 134* 134*  K 4.0 3.4* 3.7 3.7  CL 102 102 101 102  CO2 30 26 26 25   GLUCOSE 93 107* 109* 96  BUN 17 16 32* 35*  CREATININE 0.99 0.98 1.85* 1.77*  CALCIUM 8.9 8.6* 8.4* 8.3*  MG 2.3  --  2.3  --    Liver Function Tests: No results for input(s): "AST", "ALT", "ALKPHOS", "BILITOT", "PROT", "ALBUMIN" in the last 168 hours. No results for input(s): "LIPASE", "AMYLASE" in the last 168 hours. No results for  input(s): "AMMONIA" in the last 168 hours. Coagulation Profile: No results for input(s): "INR", "PROTIME" in the last 168 hours. CBC: Recent Labs  Lab 09/16/22 1214 09/17/22 0556  WBC 4.6 6.1  HGB 14.7 14.3  HCT 45.6 44.3  MCV 95.2 93.3  PLT 157 174   Cardiac Enzymes: Recent Labs  Lab 09/17/22 0941  CKTOTAL 132   BNP: Invalid input(s): "POCBNP" CBG: No results for input(s): "GLUCAP" in the last 168 hours. HbA1C: No results for input(s): "HGBA1C" in the last 72 hours. Urine analysis:    Component Value Date/Time   COLORURINE YELLOW 09/17/2022 1030   APPEARANCEUR CLEAR 09/17/2022 1030   LABSPEC 1.011 09/17/2022 1030   PHURINE 5.0 09/17/2022 1030   GLUCOSEU NEGATIVE 09/17/2022 1030   HGBUR NEGATIVE 09/17/2022 1030   BILIRUBINUR NEGATIVE 09/17/2022 1030   KETONESUR NEGATIVE 09/17/2022 1030   PROTEINUR NEGATIVE 09/17/2022 1030   UROBILINOGEN 0.2 02/11/2012 1600   NITRITE NEGATIVE 09/17/2022 1030   LEUKOCYTESUR NEGATIVE 09/17/2022 1030   Sepsis Labs: @LABRCNTIP (procalcitonin:4,lacticidven:4) ) Recent Results (from the past  240 hour(s))  MRSA Next Gen by PCR, Nasal     Status: None   Collection Time: 09/17/22  3:40 PM   Specimen: Nasal Mucosa; Nasal Swab  Result Value Ref Range Status   MRSA by PCR Next Gen NOT DETECTED NOT DETECTED Final    Comment: (NOTE) The GeneXpert MRSA Assay (FDA approved for NASAL specimens only), is one component of a comprehensive MRSA colonization surveillance program. It is not intended to diagnose MRSA infection nor to guide or monitor treatment for MRSA infections. Test performance is not FDA approved in patients less than 27 years old. Performed at [facility], [address] [place], [place] [address]      Scheduled Meds:  acetaminophen  1,000 mg Oral Q6H   albuterol  2.5 mg Nebulization BID   amLODipine  10 mg Oral Daily   arformoterol  15 mcg Nebulization BID   atorvastatin  80 mg Oral Daily   bisoprolol  5 mg Oral Daily    budesonide (PULMICORT) nebulizer solution  0.5 mg Nebulization BID   clopidogrel  75 mg Oral Daily   enoxaparin (LOVENOX) injection  30 mg Subcutaneous Q24H   hydrALAZINE  50 mg Oral TID   isosorbide dinitrate  20 mg Oral TID   revefenacin  175 mcg Nebulization Daily   vitamin B-12  500 mcg Oral Daily   Continuous Infusions:  niCARDipine 3 mg/hr (09/17/22 0843)    Procedures/Studies: DG Lumbar Spine 2-3 Views  Result Date: 09/18/2022 CLINICAL DATA:  Lower back and right hip pain. EXAM: LUMBAR SPINE - 2-3 VIEW COMPARISON:  Lumbar spine radiographs 01/26/2019 FINDINGS: There are 5 non-rib-bearing lumbar-type vertebral bodies. Normal frontal alignment. No sagittal spondylolisthesis. Vertebral body heights are maintained. Mild L5-S1 greater than L4-5 posterior disc space narrowing. Moderate to severe L5-S1 greater than L4-5 facet joint hypertrophy and sclerosis. Moderate to high-grade atherosclerotic calcifications. IMPRESSION: Mild L5-S1 greater than L4-5 degenerative disc changes. High-grade L5-S1 greater than L4-5 facet joint degenerative changes. Electronically Signed   By: Neita Garnet M.D.   On: 09/18/2022 10:52   DG HIP UNILAT WITH PELVIS 2-3 VIEWS RIGHT  Result Date: 09/18/2022 CLINICAL DATA:  Lower back and right hip pain. EXAM: DG HIP (WITH OR WITHOUT PELVIS) 2-3V RIGHT COMPARISON:  CT chest, abdomen, and pelvis 09/16/2022 FINDINGS: There is diffuse decreased bone mineralization. Mild bilateral sacroiliac subchondral sclerosis. Severe pubic symphysis joint space narrowing and subchondral sclerosis with mild-to-moderate peripheral superior degenerative osteophytes. Moderate bilateral femoroacetabular joint space narrowing with mild-to-moderate bilateral superolateral acetabular degenerative osteophytes. No acute fracture is seen. No dislocation. Moderate atherosclerotic calcifications. IMPRESSION: 1. Moderate bilateral femoroacetabular osteoarthritis. 2. Severe pubic symphysis  osteoarthritis. Electronically Signed   By: Neita Garnet M.D.   On: 09/18/2022 10:49   ECHOCARDIOGRAM COMPLETE  Result Date: 09/17/2022    ECHOCARDIOGRAM REPORT   Patient Name:   [name] Date of Exam: 09/17/2022 Medical Rec #:  [id number]        Height:       61.0 in Accession #:    [id number]       Weight:       132.7 lb Date of Birth:  01-27-1941         BSA:          1.587 m Patient Age:    81 years         BP:           154/55 mmHg Patient Gender: F  HR:           78 bpm. [name] Procedure: 2D Echo, Cardiac Doppler and Color Doppler Indications:    Chest Pain R07.9  History:        Patient has prior history of Echocardiogram examinations, most                 recent 02/12/2012. COPD, TIA and Stroke; Risk                 Factors:Hypertension and Dyslipidemia. Hx of Tobacco abuse.  Sonographer:    Celesta Gentile RCS Referring Phys: [phone] Venetia Prewitt IMPRESSIONS  1. Left ventricular ejection fraction, by estimation, is >75%. The left ventricle has hyperdynamic function. The left ventricle has no regional wall motion abnormalities. Left ventricular diastolic parameters are consistent with Grade I diastolic dysfunction (impaired relaxation).  2. Right ventricular systolic function is normal. The right ventricular size is normal. Tricuspid regurgitation signal is inadequate for assessing PA pressure.  3. The mitral valve is normal in structure. No evidence of mitral valve regurgitation. No evidence of mitral stenosis.  4. The aortic valve was not well visualized. Aortic valve regurgitation is not visualized. No aortic stenosis is present.  5. The inferior vena cava is normal in size with greater than 50% respiratory variability, suggesting right atrial pressure of 3 mmHg. Comparison(s): No prior Echocardiogram. FINDINGS  Left Ventricle: Left ventricular ejection fraction, by estimation, is >75%. The left ventricle has hyperdynamic function. The left ventricle has no regional wall  motion abnormalities. The left ventricular internal cavity size was normal in size. There is no left ventricular hypertrophy. Left ventricular diastolic parameters are consistent with Grade I diastolic dysfunction (impaired relaxation). Right Ventricle: The right ventricular size is normal. No increase in right ventricular wall thickness. Right ventricular systolic function is normal. Tricuspid regurgitation signal is inadequate for assessing PA pressure. Left Atrium: Left atrial size was normal in size. Right Atrium: Right atrial size was normal in size. Pericardium: There is no evidence of pericardial effusion. Mitral Valve: The mitral valve is normal in structure. There is mild thickening of the mitral valve leaflet(s). There is mild calcification of the mitral valve leaflet(s). No evidence of mitral valve regurgitation. No evidence of mitral valve stenosis. Tricuspid Valve: The tricuspid valve is normal in structure. Tricuspid valve regurgitation is not demonstrated. No evidence of tricuspid stenosis. Aortic Valve: The aortic valve was not well visualized. Aortic valve regurgitation is not visualized. No aortic stenosis is present. Pulmonic Valve: The pulmonic valve was not well visualized. Pulmonic valve regurgitation is not visualized. No evidence of pulmonic stenosis. Aorta: The aortic root is normal in size and structure. Venous: The inferior vena cava is normal in size with greater than 50% respiratory variability, suggesting right atrial pressure of 3 mmHg. IAS/Shunts: No atrial level shunt detected by color flow Doppler.  LEFT VENTRICLE PLAX 2D LVIDd:         4.10 cm   Diastology LVIDs:         2.00 cm   LV e' medial:    6.74 cm/s LV PW:         1.00 cm   LV E/e' medial:  8.7 LV IVS:        1.00 cm   LV e' lateral:   7.40 cm/s LVOT diam:     1.80 cm   LV E/e' lateral: 7.9 LV SV:         67 LV SV Index:  43 LVOT Area:     2.54 cm  RIGHT VENTRICLE RV S prime:     12.80 cm/s TAPSE (M-mode): 2.5 cm LEFT  ATRIUM             Index        RIGHT ATRIUM           Index LA diam:        3.10 cm 1.95 cm/m   RA Area:     15.50 cm LA Vol (A2C):   50.7 ml 31.95 ml/m  RA Volume:   42.00 ml  26.47 ml/m LA Vol (A4C):   48.6 ml 30.63 ml/m LA Biplane Vol: 50.0 ml 31.51 ml/m  AORTIC VALVE LVOT Vmax:   130.00 cm/s LVOT Vmean:  93.200 cm/s LVOT VTI:    0.265 m  AORTA Ao Root diam: 2.80 cm MITRAL VALVE MV Area (PHT): 2.95 cm    SHUNTS MV Decel Time: 257 msec    Systemic VTI:  0.26 m MV E velocity: 58.70 cm/s  Systemic Diam: 1.80 cm MV A velocity: 89.40 cm/s MV E/A ratio:  0.66 Vishnu Priya Mallipeddi Electronically signed by Winfield Rast Mallipeddi Signature Date/Time: 09/17/2022/2:50:27 PM    Final    EEG adult  Result Date: 09/17/2022 Tabitha Quest, MD     09/17/2022  2:54 PM Patient Name: [name] MRN: [id number] Epilepsy Attending: Charlsie Cisneros Referring Physician/Provider: Catarina Hartshorn, MD Date: 09/17/2022 Duration: 23.03 mins Patient history: 82yo M getting eeg to evaluate for seizure Level of alertness: Awake AEDs during EEG study: None Technical aspects: This EEG study was done with scalp electrodes positioned according to the 10-20 International system of electrode placement. Electrical activity was reviewed with band pass filter of 1-70Hz , sensitivity of 7 uV/mm, display speed of 12mm/sec with a 60Hz  notched filter applied as appropriate. EEG data were recorded continuously and digitally stored.  Video monitoring was available and reviewed as appropriate. Description: The posterior dominant rhythm consists of 9-10 Hz activity of moderate voltage (25-35 uV) seen predominantly in posterior head regions, symmetric and reactive to eye opening and eye closing. Hyperventilation and photic stimulation were not performed.   IMPRESSION: This study is within normal limits. No seizures or epileptiform discharges were seen throughout the recording. A normal interictal EEG does not exclude the diagnosis of epilepsy.  [name]   MR BRAIN WO CONTRAST  Result Date: 09/17/2022 CLINICAL DATA:  82 year old female with recent chest and epigastric pain. Hypertension. Neurologic deficit. EXAM: MRI HEAD WITHOUT CONTRAST TECHNIQUE: Multiplanar, multiecho pulse sequences of the brain and surrounding structures were obtained without intravenous contrast. COMPARISON:  Brain MRI 05/25/2022. FINDINGS: Brain: Stable cerebral volume, normal for age. No restricted diffusion to suggest acute infarction. No midline shift, mass effect, evidence of mass lesion, ventriculomegaly, extra-axial collection or acute intracranial hemorrhage. Cervicomedullary junction and pituitary are within normal limits. Widely scattered small, also Patchy and confluent cerebral white matter T2 and FLAIR hyperintensity in both hemispheres appears stable since December. No superimposed cortical encephalomalacia or chronic cerebral blood products identified. Small chronic lacunar infarct at the right caudothalamic groove appears stable. Otherwise the deep gray nuclei, brainstem, and cerebellum appear normal for age. Vascular: Major intracranial vascular flow voids are stable. Skull and upper cervical spine: Negative for age visible cervical spine. Hyperostosis frontalis, normal variant. Visualized bone marrow signal is within normal limits. Sinuses/Orbits: Stable, negative. Other: Mastoids remain well aerated. Grossly normal visible internal auditory structures. Negative visible scalp and face. IMPRESSION: 1. No  acute intracranial abnormality. 2. Stable noncontrast MRI appearance of the brain since December. Moderate for age mostly white matter signal changes, compatible with chronic small vessel disease. Electronically Signed   By: Odessa Fleming M.D.   On: 09/17/2022 10:56   CT Angio Chest/Abd/Pel for Dissection W and/or W/WO  Result Date: 09/16/2022 CLINICAL DATA:  Chest pain and epigastric pain. Extending to the left arm. Hypertension EXAM: CT ANGIOGRAPHY CHEST,  ABDOMEN AND PELVIS TECHNIQUE: Non-contrast CT of the chest was initially obtained. Multidetector CT imaging through the chest, abdomen and pelvis was performed using the standard protocol during bolus administration of intravenous contrast. Multiplanar reconstructed images and MIPs were obtained and reviewed to evaluate the vascular anatomy. RADIATION DOSE REDUCTION: This exam was performed according to the departmental dose-optimization program which includes automated exposure control, adjustment of the mA and/or kV according to patient size and/or use of iterative reconstruction technique. CONTRAST:  OMNIPAQUE IOHEXOL 350 MG/ML SOLN COMPARISON:  CT angiogram chest 05/15/2021 and older. X-ray earlier 09/16/2022 and older FINDINGS: CTA CHEST FINDINGS Cardiovascular: There is mild-to-moderate partially calcified atherosclerotic plaque along the thoracic aorta. No increased density on noncontrast imaging to suggest an intramural hematoma. Small saccular outpouching extending to the left from the aortic arch. Penetrating ulcer or irregular plaque. Otherwise no dissection or aneurysm formation. Diameter of the aorta just above the aortic root up to 2.7 cm. Aortic arch has a diameter approaching 2.6 cm. The ascending aorta at the level of the right pulmonary artery measures 2.9 x 3.0 cm in the descending thoracic aorta at the same level measures 2.5 x 2.5 cm. The heart is nonenlarged. No significant pericardial effusion. Coronary artery calcifications are seen. Mediastinum/Nodes: Slightly patulous thoracic esophagus. Preserved thyroid gland. No specific abnormal lymph node enlargement identified in the axillary regions, hilum or mediastinum. Lungs/Pleura: There is some linear opacity lung bases likely scar or atelectasis. No consolidation, pneumothorax or effusion. 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 Musculoskeletal: Fixation hardware seen along the lower cervical spine the edge of the imaging  field. Scattered degenerative changes of the thoracic spine. Global osteopenia. Review of the MIP images confirms the above findings. CTA ABDOMEN AND PELVIS FINDINGS VASCULAR Aorta: Moderate calcified plaque identified along the abdominal aorta without dissection or aneurysm formation. Celiac: Mild plaque along the celiac.  Standard branching pattern. SMA: Patent SMA origin. Renals: There is some atherosclerotic plaque along the origin of the renal arteries. Moderate stenosis on the left at the origin. There is moderate atrophy of the left kidney. IMA: Focally occluded. Inflow: Mild-to-moderate atherosclerotic partially calcified plaque along the common iliac arteries. Only mild areas of stenosis, less than 50%. Less significant disease along the external iliac arteries. Moderate along the internal iliac with areas of significant ostial stenosis. Veins: No obvious venous abnormality within the limitations of this arterial phase study. Review of the MIP images confirms the above findings. NON-VASCULAR Hepatobiliary: With the limits of the arterial phase of the contrast bolus, the liver has some small low-attenuation lesions consistent with benign cysts. Example right hepatic lobe segment 7 measuring 13 mm in diameter with Hounsfield unit of 14. No specific imaging follow-up. Portal vein is patent. Gallbladder is mildly distended. Pancreas: Unremarkable. No pancreatic ductal dilatation or surrounding inflammatory changes. Spleen: Normal in size without focal abnormality. Adrenals/Urinary Tract: The adrenal glands are preserved. There is moderate atrophy of the left kidney and mild right. Small cystic foci are noted along each kidney, Bosniak 1 and 2 lesions. No specific imaging follow up Stomach/Bowel:  There is fluid along the nondilated stomach. Small and large bowel are nondilated. Scattered colonic stool. Overall moderate stool burden. Few colonic diverticula. Normal retrocecal appendix in the right hemipelvis  Lymphatic: No developing abnormal lymph node enlargement identified in the abdomen and pelvis. Reproductive: Prostate is unremarkable. Other: Mild anasarca.  No free air or free fluid. Musculoskeletal: Scattered degenerative changes of the spine and pelvis. Review of the MIP images confirms the above findings. IMPRESSION: Moderate partially calcified atherosclerotic plaque overall. No dissection or aneurysm formation. Focal stenosis seen of the left renal artery with moderate atrophy of the left kidney. Please correlate for level of hypertension. Occluded inferior mesenteric artery. Colonic diverticula.  Moderate colonic stool. No obstruction, free air or free fluid. Centrilobular emphysematous lung changes. Electronically Signed   By: Karen Kays M.D.   On: 09/16/2022 15:07   DG Chest Port 1 View  Result Date: 09/16/2022 CLINICAL DATA:  Chest pain. EXAM: PORTABLE CHEST 1 VIEW COMPARISON:  One-view chest x-ray 03/25/2022 FINDINGS: The heart size is normal. Atherosclerotic calcifications are present at the aortic arch. And chronic bronchitic changes are most evident the lung apices. No superimposed acute disease is present. No edema or effusion is present. No focal airspace disease is present. IMPRESSION: 1. No acute cardiopulmonary disease. 2. Chronic bronchitic changes. Electronically Signed   By: Marin Roberts M.D.   On: 09/16/2022 12:22   US Carotid Duplex Bilateral  Result Date: 08/25/2022 CLINICAL DATA:  Amaurosis fugax. History of stroke (2001), hypertension. Former smoker. EXAM: BILATERAL CAROTID DUPLEX ULTRASOUND TECHNIQUE: Wallace Cullens scale imaging, color Doppler and duplex ultrasound were performed of bilateral carotid and vertebral arteries in the neck. COMPARISON:  None Available. FINDINGS: Criteria: Quantification of carotid stenosis is based on velocity parameters that correlate the residual internal carotid diameter with NASCET-based stenosis levels, using the diameter of the distal internal  carotid lumen as the denominator for stenosis measurement. The following velocity measurements were obtained: RIGHT ICA: 112/19 cm/sec CCA: 99/10 cm/sec SYSTOLIC ICA/CCA RATIO:  1.1 ECA: 34 cm/sec LEFT ICA: 62/12 cm/sec CCA: 59/8 cm/sec SYSTOLIC ICA/CCA RATIO:  1.1 ECA: 67 cm/sec RIGHT CAROTID ARTERY: There is a minimal amount of eccentric echogenic plaque scattered throughout the right common carotid artery (image 10). There is a moderate to large amount of eccentric echogenic plaque involving the right carotid bulb (image 19), extending to involve the origin and proximal aspects of the right internal carotid artery (image 27), not resulting in elevated peak systolic velocities within the interrogated course of the right internal carotid artery to suggest a hemodynamically significant stenosis. RIGHT VERTEBRAL ARTERY:  Antegrade flow LEFT CAROTID ARTERY: There is a minimal amount of eccentric echogenic plaque within the left carotid bulb (image 55), extending to involve the origin and proximal aspects of the left internal carotid artery (image 63), not resulting in elevated peak systolic velocities within the interrogated course of the left internal carotid artery to suggest a hemodynamically significant stenosis. LEFT VERTEBRAL ARTERY:  Antegrade flow IMPRESSION: 1. Moderate-to-large amount of right-sided atherosclerotic plaque, not resulting in a hemodynamically significant stenosis. 2. Minimal amount of left-sided atherosclerotic plaque, not resulting in a hemodynamically significant stenosis. Electronically Signed   By: Simonne Come M.D.   On: 08/25/2022 08:49    Catarina Hartshorn, DO  [facility]  If 7PM-7AM, please contact night-coverage [email or web address] Password [facility] 09/19/2022, 4:51 PM   LOS: 3 days

## 2022-09-19 NOTE — Progress Notes (Signed)
Complains of left upper quadrant pain that radiates to back.  Received flexeril earlier and stated that it helped.  BP 118/77 and Dr. Arbutus Leas ordered to hold apresoline and give isosorbide .

## 2022-09-19 NOTE — Progress Notes (Signed)
   09/18/22 1300  Spiritual Encounters  Type of Visit Initial  Care provided to: Patient  Conversation partners present during encounter Nurse  Referral source IDT Rounds  Reason for visit Routine spiritual support  OnCall Visit No  Spiritual Framework  Presenting Themes Impactful experiences and emotions;Meaning/purpose/sources of inspiration;Courage hope and growth  Community/Connection Family;Friend(s);Faith community  Patient Stress Factors Family relationships;Lack of caregivers  Family Stress Factors None identified  Interventions  Spiritual Care Interventions Made Meaning making;Prayer;Established relationship of care and support;Compassionate presence  Intervention Outcomes  Outcomes Connection to spiritual care;Awareness of support  Spiritual Care Plan  Spiritual Care Issues Still Outstanding Chaplain will continue to follow   Found [name] sitting on the side of her hospital bed today. She welcomed Chaplain bedside and stated that she was anxious. WE talked about why she was so anxious and chaplain provided opportunity for her to reflect on the last few days events and her perceived responsibilities at home.  She shared how her BP rose quickly and how fortunate she was that she came into the ED when she did. She shared how she cares for her husband who has sickle cell disease and is almost blind at home. She is very anxious to get back home in order to care for him. Chaplain provided space for her to reflect and provided encouragement today. Closed visit with prayer. Will continue to follow in order to provide spiritual support and to assess for spiritual need.   Rev. Jolyn Lent, M.Div. Chaplain

## 2022-09-20 ENCOUNTER — Ambulatory Visit: Payer: 59 | Admitting: Family Medicine

## 2022-09-20 DIAGNOSIS — E538 Deficiency of other specified B group vitamins: Secondary | ICD-10-CM | POA: Diagnosis not present

## 2022-09-20 DIAGNOSIS — N179 Acute kidney failure, unspecified: Secondary | ICD-10-CM | POA: Diagnosis not present

## 2022-09-20 DIAGNOSIS — Z8673 Personal history of transient ischemic attack (TIA), and cerebral infarction without residual deficits: Secondary | ICD-10-CM

## 2022-09-20 DIAGNOSIS — L8990 Pressure ulcer of unspecified site, unspecified stage: Secondary | ICD-10-CM | POA: Diagnosis present

## 2022-09-20 DIAGNOSIS — I160 Hypertensive urgency: Secondary | ICD-10-CM | POA: Diagnosis not present

## 2022-09-20 LAB — BASIC METABOLIC PANEL
Anion gap: 7 (ref 5–15)
BUN: 36 mg/dL — ABNORMAL HIGH (ref 8–23)
CO2: 26 mmol/L (ref 22–32)
Calcium: 8.2 mg/dL — ABNORMAL LOW (ref 8.9–10.3)
Chloride: 102 mmol/L (ref 98–111)
Creatinine, Ser: 1.69 mg/dL — ABNORMAL HIGH (ref 0.44–1.00)
GFR, Estimated: 31 mL/min — ABNORMAL LOW (ref >60)
Glucose, Bld: 101 mg/dL — ABNORMAL HIGH (ref 70–99)
Potassium: 4 mmol/L (ref 3.5–5.1)
Sodium: 135 mmol/L (ref 135–145)

## 2022-09-20 MED ORDER — ISOSORBIDE DINITRATE 20 MG PO TABS: 20.0000 mg | ORAL_TABLET | Freq: Three times a day (TID) | ORAL | 2 refills | Status: DC

## 2022-09-20 MED ORDER — BISOPROLOL FUMARATE 5 MG PO TABS: 5.0000 mg | ORAL_TABLET | Freq: Every day | ORAL | 2 refills | Status: DC

## 2022-09-20 MED ORDER — CYANOCOBALAMIN 1000 MCG PO TABS: 1000.0000 ug | ORAL_TABLET | Freq: Every day | ORAL | 2 refills | Status: DC

## 2022-09-20 MED ORDER — AMLODIPINE BESYLATE 10 MG PO TABS
10.0000 mg | ORAL_TABLET | Freq: Every day | ORAL | 3 refills | Status: DC
Start: 2022-09-20 — End: 2023-05-06

## 2022-09-20 MED ORDER — HYDRALAZINE HCL 50 MG PO TABS: 50.0000 mg | ORAL_TABLET | Freq: Three times a day (TID) | ORAL | 3 refills | Status: DC

## 2022-09-20 NOTE — Discharge Instructions (Signed)
IMPORTANT INFORMATION: PAY CLOSE ATTENTION  ? ?PHYSICIAN DISCHARGE INSTRUCTIONS ? ?Follow with Primary care provider  [name], [place], MD  and other consultants as instructed by your Hospitalist Physician ? ?SEEK MEDICAL CARE OR RETURN TO EMERGENCY ROOM IF SYMPTOMS COME BACK, WORSEN OR NEW PROBLEM DEVELOPS  ? ?Please note: ?You were cared for by a hospitalist during your hospital stay. Every effort will be made to forward records to your primary care provider.  You can request that your primary care provider send for your hospital records if they have not received them.  Once you are discharged, your primary care physician will handle any further medical issues. Please note that NO REFILLS for any discharge medications will be authorized once you are discharged, as it is imperative that you return to your primary care physician (or establish a relationship with a primary care physician if you do not have one) for your post hospital discharge needs so that they can reassess your need for medications and monitor your lab values. ? ?Please get a complete blood count and chemistry panel checked by your Primary MD at your next visit, and again as instructed by your Primary MD. ? ?Get Medicines reviewed and adjusted: ?Please take all your medications with you for your next visit with your Primary MD ? ?Laboratory/radiological data: ?Please request your Primary MD to go over all hospital tests and procedure/radiological results at the follow up, please ask your primary care provider to get all Hospital records sent to his/her office. ? ?In some cases, they will be blood work, cultures and biopsy results pending at the time of your discharge. Please request that your primary care provider follow up on these results. ? ?If you are diabetic, please bring your blood sugar readings with you to your follow up appointment with primary care.   ? ?Please call and make your follow up appointments as soon as possible.   ? ?Also Note  the following: ?If you experience worsening of your admission symptoms, develop shortness of breath, life threatening emergency, suicidal or homicidal thoughts you must seek medical attention immediately by calling 911 or calling your MD immediately  if symptoms less severe. ? ?You must read complete instructions/literature along with all the possible adverse reactions/side effects for all the Medicines you take and that have been prescribed to you. Take any new Medicines after you have completely understood and accpet all the possible adverse reactions/side effects.  ? ?Do not drive when taking Pain medications or sleeping medications (Benzodiazepines) ? ?Do not take more than prescribed Pain, Sleep and Anxiety Medications. It is not advisable to combine anxiety,sleep and pain medications without talking with your primary care practitioner ? ?Special Instructions: If you have smoked or chewed Tobacco  in the last 2 yrs please stop smoking, stop any regular Alcohol  and or any Recreational drug use. ? ?Wear Seat belts while driving.  Do not drive if taking any narcotic, mind altering or controlled substances or recreational drugs or alcohol.  ? ? ? ? ? ?

## 2022-09-20 NOTE — Discharge Summary (Signed)
Physician Discharge Summary  [name]:[id number] DOB: June 16, 1940 DOA: 09/16/2022  PCP: Mechele Claude, MD  Admit date: 09/16/2022 Discharge date: 09/20/2022  Admitted From:  Home  Disposition: Home   Recommendations for Outpatient Follow-up:  Follow up with PCP in 2 weeks Follow up with cardiology as scheduled 10/15/22  Follow up with vascular surgery to monitor renal artery stenosis Please obtain BMP in 1-2 weeks to follow up renal function  Please check vitamin B12 level in 1 month and start monthly B12 injections if needed   Discharge Condition: STABLE   CODE STATUS: FULL DIET: 2 gram sodium restricted   Brief Hospitalization Summary: Please see all hospital notes, images, labs for full details of the hospitalization. ADMISSION HPI:  82 year old female with a history of TIA/stroke, hypertension, hypertensive cephalopathy, tobacco abuse in remission, COPD presenting with a constellation of symptoms including chest pain, elevated blood pressure, and transient altered vision.  The patient was drinking her morning coffee when she began having chest discomfort that was pressure-like radiate down to her left arm.  She checked her blood pressure and her systolic blood pressure was 199.  As result EMS was activated.  The patient has some dizziness and nausea.  She denies syncope.  She denied any shortness of breath, palpitations or diaphoresis.  She is compliant with her medications.  Around the same time, the patient had some alterations in her vision.  She felt like she was seeing some orange spots and felt like her visual fields were discolored.  She states that she has had this type sensation in the past.  She was awake and alert and lucid during the entire episode.  She stated that her chest discomfort lasted about an hour.  While she was in the emergency department, she had an episode of whole body pain from the tip of her head to her toes.  During this episode, the patient felt like  she was having difficulty moving her legs.  Again she was awake and communicative. She was started on nicardipine drip and admitted for further evaluation and treatment. ED Course: Temperature 98.1.  Heart rate 60s to 102.  Respirate rate 14-29.  Blood pressure systolic initially up to 237.  Given total of 50 mg of hydralazine without improvement. EKG shows sinus rhythm rate 70, no acute abnormalities.  Troponin 11 > 13.  CTA chest/Abd/Pelvis- shows Moderate partially calcified atherosclerotic plaque overall. No dissection or aneurysm formation. Focal stenosis seen of the left renal artery with moderate atrophy of the left kidney. Please correlate for level of hypertension. Occluded inferior mesenteric artery.   Nicardipine drip started with improvement in blood pressure down to 140s systolic.   EDP talked to Vascular Surg- Dr. Chestine Spore who states that there is good flow in the celiac trunk and the SMA and the patient would not require aggressive intervention for the IMA.  In regards to the renal artery stenosis, this can be followed outpatient in clinic.    HOSPITAL COURSE BY PROBLEM LIST  Hypertensive urgency -SBP up to 221/88 -initially treated with nicardipine drip -Weaned off nicardipine drip as oral antihypertensive medications were introduced -Restart amlodipine, hydralazine -Started low-dose bisoprolol -ISDN started  -Weaned nicardipine drip off -seen by cardiology and now on new BP regimen, amlodipine 10 mg daily, hydralazine 50 mg TID, isosorbide dinitrate 20 mg TIDm bisoprolol 5 mg daily.  -BP is much better controlled on this current regimen.    AKI on CKD 3a -baseline creatinine 0.9-1.2 -serum creatinine peaking 1.85 -due  to contrast -serum creatinine appears to be reaching a plateau -avoid nephrotoxins -ARB/HCTZ was discontinued    Chest pain -Troponins 11>13 -likely related to HTN urgency -Cardiology consult appreciated -CT coronary 03/30/22-diagnosed with moderate  atherosclerosis, symptom guided anti-ischemic pharmacotherapy and risk factor modification recommended.  Subsequent coronary CT FFR analysis demonstrated no significant flow-limiting lesions.  Follows with cardiology.  -Resumed Plavix and statin   Left renal artery stenosis -CTA chest abdomen and pelvis - focal stenosis seen of the left renal artery with moderate atrophy of the left kidney, occluded inferior mesenteric artery. - EDP spoke with the vascular surgeon on-call Dr. Chestine Spore - there is good flow in the celiac trunk and the SMA and the patient would not require aggressive intervention for the IMA.  In regards to the renal artery stenosis, this can be followed as outpatient,  But is likely driving some of this hypertension.  -amb referral to [facility] for outpatient surveillance    Severe Vitamin B12 Deficiency - IM injection given 09/17/22 - oral B12 1000 mcg daily ordered - recheck B12 level outpatient with PCP and start monthly injections if needed   COPD -Stable on room air -Restarted home Yupelri and Pulmicort -Continue albuterol -resume home bronchodilator regimen at discharge    Hypokalemia -repleted -check mag 2.3    Chronic low Back pain -xray lumbar--no fracture -R-pelvis--no fracture -no red flag symptoms   Discharge Diagnoses:  Principal Problem:   Hypertensive urgency Active Problems:   B12 deficiency   History of TIA (transient ischemic attack)   COPD with chronic bronchitis   GAD (generalized anxiety disorder)   Atypical chest pain   GERD (gastroesophageal reflux disease)   AKI (acute kidney injury)   Left renal artery stenosis   Pressure injury of skin   Discharge Instructions: Discharge Instructions     Ambulatory referral to Vascular Surgery   Complete by: As directed       Allergies as of 09/20/2022       Reactions   Penicillins Anaphylaxis, Swelling, Rash, Other (See Comments)   Has patient had a PCN reaction causing immediate rash,  facial/tongue/throat swelling, SOB or lightheadedness with hypotension: yes Has patient had a PCN reaction causing severe rash involving mucus membranes or skin necrosis: no Has patient had a PCN reaction that required hospitalization: yes Has patient had a PCN reaction occurring within the last 10 years: no If all of the above answers are "NO", then may proceed with Cephalosporin use.        Medication List     STOP taking these medications    levalbuterol 45 MCG/ACT inhaler Commonly known as: XOPENEX HFA   meloxicam 15 MG tablet Commonly known as: MOBIC   revefenacin 175 MCG/3ML nebulizer solution Commonly known as: YUPELRI   valsartan-hydrochlorothiazide 160-12.5 MG tablet Commonly known as: Diovan HCT       TAKE these medications    albuterol 108 (90 Base) MCG/ACT inhaler Commonly known as: VENTOLIN HFA Inhale 2 puffs into the lungs every 6 (six) hours as needed for wheezing or shortness of breath.   amLODipine 10 MG tablet Commonly known as: NORVASC Take 1 tablet (10 mg total) by mouth daily.   arformoterol 15 MCG/2ML Nebu Commonly known as: BROVANA Take 2 mLs (15 mcg total) by nebulization 2 (two) times daily.   atorvastatin 80 MG tablet Commonly known as: LIPITOR Take 1 tablet (80 mg total) by mouth daily.   bisoprolol 5 MG tablet Commonly known as: ZEBETA Take 1 tablet (5 mg  total) by mouth daily. Start taking on: September 21, 2022   budesonide 0.5 MG/2ML nebulizer solution Commonly known as: PULMICORT Take 2 mLs (0.5 mg total) by nebulization 2 (two) times daily.   budesonide-formoterol 160-4.5 MCG/ACT inhaler Commonly known as: SYMBICORT Inhale 2 puffs into the lungs 2 (two) times daily.   clopidogrel 75 MG tablet Commonly known as: PLAVIX TAKE ONE TABLET ONCE DAILY   cyanocobalamin 1000 MCG tablet Take 1 tablet (1,000 mcg total) by mouth daily. Start taking on: September 21, 2022   cyclobenzaprine 5 MG tablet Commonly known as: FLEXERIL TAKE  ONE TABLET THREE TIMES DAILY AS NEEDED FOR MUSCLE SPASMS What changed: See the new instructions.   hydrALAZINE 50 MG tablet Commonly known as: APRESOLINE Take 1 tablet (50 mg total) by mouth 3 (three) times daily.   isosorbide dinitrate 20 MG tablet Commonly known as: ISORDIL Take 1 tablet (20 mg total) by mouth 3 (three) times daily.        Follow-up Information     Alver Sorrow, NP Follow up.   Specialty: Cardiology Why: [facility] location - follow-up arranged with nurse practitioner Gillian Shields on Monday Oct 15, 2022 at 10:05 AM (Arrive by 9:50 AM). Contact information: [address] [place] [address] [phone]         Mechele Claude, MD. Schedule an appointment as soon as possible for a visit in 1 week(s).   Specialty: Family Medicine Why: Hospital Follow Up Contact information: [address] [place] [address] [phone]         Maeola Harman, MD. Schedule an appointment as soon as possible for a visit in 3 week(s).   Specialties: Vascular Surgery, Cardiology Why: Hospital Follow Up Contact information: [address] [place] [address] [phone]                Allergies  Allergen Reactions   Penicillins Anaphylaxis, Swelling, Rash and Other (See Comments)    Has patient had a PCN reaction causing immediate rash, facial/tongue/throat swelling, SOB or lightheadedness with hypotension: yes Has patient had a PCN reaction causing severe rash involving mucus membranes or skin necrosis: no Has patient had a PCN reaction that required hospitalization: yes Has patient had a PCN reaction occurring within the last 10 years: no If all of the above answers are "NO", then may proceed with Cephalosporin use.    Allergies as of 09/20/2022       Reactions   Penicillins Anaphylaxis, Swelling, Rash, Other (See Comments)   Has patient had a PCN reaction causing immediate rash,  facial/tongue/throat swelling, SOB or lightheadedness with hypotension: yes Has patient had a PCN reaction causing severe rash involving mucus membranes or skin necrosis: no Has patient had a PCN reaction that required hospitalization: yes Has patient had a PCN reaction occurring within the last 10 years: no If all of the above answers are "NO", then may proceed with Cephalosporin use.        Medication List     STOP taking these medications    levalbuterol 45 MCG/ACT inhaler Commonly known as: XOPENEX HFA   meloxicam 15 MG tablet Commonly known as: MOBIC   revefenacin 175 MCG/3ML nebulizer solution Commonly known as: YUPELRI   valsartan-hydrochlorothiazide 160-12.5 MG tablet Commonly known as: Diovan HCT       TAKE these medications    albuterol 108 (90 Base) MCG/ACT inhaler Commonly known as: VENTOLIN HFA Inhale 2 puffs into the lungs every  6 (six) hours as needed for wheezing or shortness of breath.   amLODipine 10 MG tablet Commonly known as: NORVASC Take 1 tablet (10 mg total) by mouth daily.   arformoterol 15 MCG/2ML Nebu Commonly known as: BROVANA Take 2 mLs (15 mcg total) by nebulization 2 (two) times daily.   atorvastatin 80 MG tablet Commonly known as: LIPITOR Take 1 tablet (80 mg total) by mouth daily.   bisoprolol 5 MG tablet Commonly known as: ZEBETA Take 1 tablet (5 mg total) by mouth daily. Start taking on: September 21, 2022   budesonide 0.5 MG/2ML nebulizer solution Commonly known as: PULMICORT Take 2 mLs (0.5 mg total) by nebulization 2 (two) times daily.   budesonide-formoterol 160-4.5 MCG/ACT inhaler Commonly known as: SYMBICORT Inhale 2 puffs into the lungs 2 (two) times daily.   clopidogrel 75 MG tablet Commonly known as: PLAVIX TAKE ONE TABLET ONCE DAILY   cyanocobalamin 1000 MCG tablet Take 1 tablet (1,000 mcg total) by mouth daily. Start taking on: September 21, 2022   cyclobenzaprine 5 MG tablet Commonly known as: FLEXERIL TAKE  ONE TABLET THREE TIMES DAILY AS NEEDED FOR MUSCLE SPASMS What changed: See the new instructions.   hydrALAZINE 50 MG tablet Commonly known as: APRESOLINE Take 1 tablet (50 mg total) by mouth 3 (three) times daily.   isosorbide dinitrate 20 MG tablet Commonly known as: ISORDIL Take 1 tablet (20 mg total) by mouth 3 (three) times daily.        Procedures/Studies: DG Lumbar Spine 2-3 Views  Result Date: 09/18/2022 CLINICAL DATA:  Lower back and right hip pain. EXAM: LUMBAR SPINE - 2-3 VIEW COMPARISON:  Lumbar spine radiographs 01/26/2019 FINDINGS: There are 5 non-rib-bearing lumbar-type vertebral bodies. Normal frontal alignment. No sagittal spondylolisthesis. Vertebral body heights are maintained. Mild L5-S1 greater than L4-5 posterior disc space narrowing. Moderate to severe L5-S1 greater than L4-5 facet joint hypertrophy and sclerosis. Moderate to high-grade atherosclerotic calcifications. IMPRESSION: Mild L5-S1 greater than L4-5 degenerative disc changes. High-grade L5-S1 greater than L4-5 facet joint degenerative changes. Electronically Signed   By: Neita Garnet M.D.   On: 09/18/2022 10:52   DG HIP UNILAT WITH PELVIS 2-3 VIEWS RIGHT  Result Date: 09/18/2022 CLINICAL DATA:  Lower back and right hip pain. EXAM: DG HIP (WITH OR WITHOUT PELVIS) 2-3V RIGHT COMPARISON:  CT chest, abdomen, and pelvis 09/16/2022 FINDINGS: There is diffuse decreased bone mineralization. Mild bilateral sacroiliac subchondral sclerosis. Severe pubic symphysis joint space narrowing and subchondral sclerosis with mild-to-moderate peripheral superior degenerative osteophytes. Moderate bilateral femoroacetabular joint space narrowing with mild-to-moderate bilateral superolateral acetabular degenerative osteophytes. No acute fracture is seen. No dislocation. Moderate atherosclerotic calcifications. IMPRESSION: 1. Moderate bilateral femoroacetabular osteoarthritis. 2. Severe pubic symphysis osteoarthritis. Electronically  Signed   By: Neita Garnet M.D.   On: 09/18/2022 10:49   ECHOCARDIOGRAM COMPLETE  Result Date: 09/17/2022    ECHOCARDIOGRAM REPORT   Patient Name:   [name] Date of Exam: 09/17/2022 Medical Rec #:  [id number]        Height:       61.0 in Accession #:    [id number]       Weight:       132.7 lb Date of Birth:  November 30, 1940         BSA:          1.587 m Patient Age:    81 years         BP:           154/55 mmHg  Patient Gender: F                HR:           78 bpm. [name] Procedure: 2D Echo, Cardiac Doppler and Color Doppler Indications:    Chest Pain R07.9  History:        Patient has prior history of Echocardiogram examinations, most                 recent 02/12/2012. COPD, TIA and Stroke; Risk                 Factors:Hypertension and Dyslipidemia. Hx of Tobacco abuse.  Sonographer:    Celesta Gentile RCS Referring Phys: [phone] DAVID TAT IMPRESSIONS  1. Left ventricular ejection fraction, by estimation, is >75%. The left ventricle has hyperdynamic function. The left ventricle has no regional wall motion abnormalities. Left ventricular diastolic parameters are consistent with Grade I diastolic dysfunction (impaired relaxation).  2. Right ventricular systolic function is normal. The right ventricular size is normal. Tricuspid regurgitation signal is inadequate for assessing PA pressure.  3. The mitral valve is normal in structure. No evidence of mitral valve regurgitation. No evidence of mitral stenosis.  4. The aortic valve was not well visualized. Aortic valve regurgitation is not visualized. No aortic stenosis is present.  5. The inferior vena cava is normal in size with greater than 50% respiratory variability, suggesting right atrial pressure of 3 mmHg. Comparison(s): No prior Echocardiogram. FINDINGS  Left Ventricle: Left ventricular ejection fraction, by estimation, is >75%. The left ventricle has hyperdynamic function. The left ventricle has no regional wall motion abnormalities. The left  ventricular internal cavity size was normal in size. There is no left ventricular hypertrophy. Left ventricular diastolic parameters are consistent with Grade I diastolic dysfunction (impaired relaxation). Right Ventricle: The right ventricular size is normal. No increase in right ventricular wall thickness. Right ventricular systolic function is normal. Tricuspid regurgitation signal is inadequate for assessing PA pressure. Left Atrium: Left atrial size was normal in size. Right Atrium: Right atrial size was normal in size. Pericardium: There is no evidence of pericardial effusion. Mitral Valve: The mitral valve is normal in structure. There is mild thickening of the mitral valve leaflet(s). There is mild calcification of the mitral valve leaflet(s). No evidence of mitral valve regurgitation. No evidence of mitral valve stenosis. Tricuspid Valve: The tricuspid valve is normal in structure. Tricuspid valve regurgitation is not demonstrated. No evidence of tricuspid stenosis. Aortic Valve: The aortic valve was not well visualized. Aortic valve regurgitation is not visualized. No aortic stenosis is present. Pulmonic Valve: The pulmonic valve was not well visualized. Pulmonic valve regurgitation is not visualized. No evidence of pulmonic stenosis. Aorta: The aortic root is normal in size and structure. Venous: The inferior vena cava is normal in size with greater than 50% respiratory variability, suggesting right atrial pressure of 3 mmHg. IAS/Shunts: No atrial level shunt detected by color flow Doppler.  LEFT VENTRICLE PLAX 2D LVIDd:         4.10 cm   Diastology LVIDs:         2.00 cm   LV e' medial:    6.74 cm/s LV PW:         1.00 cm   LV E/e' medial:  8.7 LV IVS:        1.00 cm   LV e' lateral:   7.40 cm/s LVOT diam:     1.80 cm   LV  E/e' lateral: 7.9 LV SV:         67 LV SV Index:   43 LVOT Area:     2.54 cm  RIGHT VENTRICLE RV S prime:     12.80 cm/s TAPSE (M-mode): 2.5 cm LEFT ATRIUM             Index         RIGHT ATRIUM           Index LA diam:        3.10 cm 1.95 cm/m   RA Area:     15.50 cm LA Vol (A2C):   50.7 ml 31.95 ml/m  RA Volume:   42.00 ml  26.47 ml/m LA Vol (A4C):   48.6 ml 30.63 ml/m LA Biplane Vol: 50.0 ml 31.51 ml/m  AORTIC VALVE LVOT Vmax:   130.00 cm/s LVOT Vmean:  93.200 cm/s LVOT VTI:    0.265 m  AORTA Ao Root diam: 2.80 cm MITRAL VALVE MV Area (PHT): 2.95 cm    SHUNTS MV Decel Time: 257 msec    Systemic VTI:  0.26 m MV E velocity: 58.70 cm/s  Systemic Diam: 1.80 cm MV A velocity: 89.40 cm/s MV E/A ratio:  0.66 Vishnu Priya Mallipeddi Electronically signed by Winfield Rast Mallipeddi Signature Date/Time: 09/17/2022/2:50:27 PM    Final    EEG adult  Result Date: 09/17/2022 Charlsie Quest, MD     09/17/2022  2:54 PM Patient Name: SHEKETA ENDE MRN: [id number] Epilepsy Attending: Charlsie Quest Referring Physician/Provider: Catarina Hartshorn, MD Date: 09/17/2022 Duration: 23.03 mins Patient history: 82yo M getting eeg to evaluate for seizure Level of alertness: Awake AEDs during EEG study: None Technical aspects: This EEG study was done with scalp electrodes positioned according to the 10-20 International system of electrode placement. Electrical activity was reviewed with band pass filter of 1-70Hz , sensitivity of 7 uV/mm, display speed of 30mm/sec with a  notched filter applied as appropriate. EEG data were recorded continuously and digitally stored.  Video monitoring was available and reviewed as appropriate. Description: The posterior dominant rhythm consists of 9-10 Hz activity of moderate voltage (25-35 uV) seen predominantly in posterior head regions, symmetric and reactive to eye opening and eye closing. Hyperventilation and photic stimulation were not performed.   IMPRESSION: This study is within normal limits. No seizures or epileptiform discharges were seen throughout the recording. A normal interictal EEG does not exclude the diagnosis of epilepsy. Charlsie Quest   MR BRAIN WO  CONTRAST  Result Date: 09/17/2022 CLINICAL DATA:  82 year old female with recent chest and epigastric pain. Hypertension. Neurologic deficit. EXAM: MRI HEAD WITHOUT CONTRAST TECHNIQUE: Multiplanar, multiecho pulse sequences of the brain and surrounding structures were obtained without intravenous contrast. COMPARISON:  Brain MRI 05/25/2022. FINDINGS: Brain: Stable cerebral volume, normal for age. No restricted diffusion to suggest acute infarction. No midline shift, mass effect, evidence of mass lesion, ventriculomegaly, extra-axial collection or acute intracranial hemorrhage. Cervicomedullary junction and pituitary are within normal limits. Widely scattered small, also Patchy and confluent cerebral white matter T2 and FLAIR hyperintensity in both hemispheres appears stable since December. No superimposed cortical encephalomalacia or chronic cerebral blood products identified. Small chronic lacunar infarct at the right caudothalamic groove appears stable. Otherwise the deep gray nuclei, brainstem, and cerebellum appear normal for age. Vascular: Major intracranial vascular flow voids are stable. Skull and upper cervical spine: Negative for age visible cervical spine. Hyperostosis frontalis, normal variant. Visualized bone marrow signal is within normal limits. Sinuses/Orbits: Stable, negative.  Other: Mastoids remain well aerated. Grossly normal visible internal auditory structures. Negative visible scalp and face. IMPRESSION: 1. No acute intracranial abnormality. 2. Stable noncontrast MRI appearance of the brain since December. Moderate for age mostly white matter signal changes, compatible with chronic small vessel disease. Electronically Signed   By: Odessa Fleming M.D.   On: 09/17/2022 10:56   CT Angio Chest/Abd/Pel for Dissection W and/or W/WO  Result Date: 09/16/2022 CLINICAL DATA:  Chest pain and epigastric pain. Extending to the left arm. Hypertension EXAM: CT ANGIOGRAPHY CHEST, ABDOMEN AND PELVIS TECHNIQUE:  Non-contrast CT of the chest was initially obtained. Multidetector CT imaging through the chest, abdomen and pelvis was performed using the standard protocol during bolus administration of intravenous contrast. Multiplanar reconstructed images and MIPs were obtained and reviewed to evaluate the vascular anatomy. RADIATION DOSE REDUCTION: This exam was performed according to the departmental dose-optimization program which includes automated exposure control, adjustment of the mA and/or kV according to patient size and/or use of iterative reconstruction technique. CONTRAST:  OMNIPAQUE IOHEXOL 350 MG/ML SOLN COMPARISON:  CT angiogram chest 05/15/2021 and older. X-ray earlier 09/16/2022 and older FINDINGS: CTA CHEST FINDINGS Cardiovascular: There is mild-to-moderate partially calcified atherosclerotic plaque along the thoracic aorta. No increased density on noncontrast imaging to suggest an intramural hematoma. Small saccular outpouching extending to the left from the aortic arch. Penetrating ulcer or irregular plaque. Otherwise no dissection or aneurysm formation. Diameter of the aorta just above the aortic root up to 2.7 cm. Aortic arch has a diameter approaching 2.6 cm. The ascending aorta at the level of the right pulmonary artery measures 2.9 x 3.0 cm in the descending thoracic aorta at the same level measures 2.5 x 2.5 cm. The heart is nonenlarged. No significant pericardial effusion. Coronary artery calcifications are seen. Mediastinum/Nodes: Slightly patulous thoracic esophagus. Preserved thyroid gland. No specific abnormal lymph node enlargement identified in the axillary regions, hilum or mediastinum. Lungs/Pleura: There is some linear opacity lung bases likely scar or atelectasis. No consolidation, pneumothorax or effusion. 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 Musculoskeletal: Fixation hardware seen along the lower cervical spine the edge of the imaging field. Scattered degenerative  changes of the thoracic spine. Global osteopenia. Review of the MIP images confirms the above findings. CTA ABDOMEN AND PELVIS FINDINGS VASCULAR Aorta: Moderate calcified plaque identified along the abdominal aorta without dissection or aneurysm formation. Celiac: Mild plaque along the celiac.  Standard branching pattern. SMA: Patent SMA origin. Renals: There is some atherosclerotic plaque along the origin of the renal arteries. Moderate stenosis on the left at the origin. There is moderate atrophy of the left kidney. IMA: Focally occluded. Inflow: Mild-to-moderate atherosclerotic partially calcified plaque along the common iliac arteries. Only mild areas of stenosis, less than 50%. Less significant disease along the external iliac arteries. Moderate along the internal iliac with areas of significant ostial stenosis. Veins: No obvious venous abnormality within the limitations of this arterial phase study. Review of the MIP images confirms the above findings. NON-VASCULAR Hepatobiliary: With the limits of the arterial phase of the contrast bolus, the liver has some small low-attenuation lesions consistent with benign cysts. Example right hepatic lobe segment 7 measuring 13 mm in diameter with Hounsfield unit of 14. No specific imaging follow-up. Portal vein is patent. Gallbladder is mildly distended. Pancreas: Unremarkable. No pancreatic ductal dilatation or surrounding inflammatory changes. Spleen: Normal in size without focal abnormality. Adrenals/Urinary Tract: The adrenal glands are preserved. There is moderate atrophy of the left kidney and mild right.  Small cystic foci are noted along each kidney, Bosniak 1 and 2 lesions. No specific imaging follow up Stomach/Bowel: There is fluid along the nondilated stomach. Small and large bowel are nondilated. Scattered colonic stool. Overall moderate stool burden. Few colonic diverticula. Normal retrocecal appendix in the right hemipelvis Lymphatic: No developing abnormal  lymph node enlargement identified in the abdomen and pelvis. Reproductive: Prostate is unremarkable. Other: Mild anasarca.  No free air or free fluid. Musculoskeletal: Scattered degenerative changes of the spine and pelvis. Review of the MIP images confirms the above findings. IMPRESSION: Moderate partially calcified atherosclerotic plaque overall. No dissection or aneurysm formation. Focal stenosis seen of the left renal artery with moderate atrophy of the left kidney. Please correlate for level of hypertension. Occluded inferior mesenteric artery. Colonic diverticula.  Moderate colonic stool. No obstruction, free air or free fluid. Centrilobular emphysematous lung changes. Electronically Signed   By: Karen Kays M.D.   On: 09/16/2022 15:07   DG Chest Port 1 View  Result Date: 09/16/2022 CLINICAL DATA:  Chest pain. EXAM: PORTABLE CHEST 1 VIEW COMPARISON:  One-view chest x-ray 03/25/2022 FINDINGS: The heart size is normal. Atherosclerotic calcifications are present at the aortic arch. And chronic bronchitic changes are most evident the lung apices. No superimposed acute disease is present. No edema or effusion is present. No focal airspace disease is present. IMPRESSION: 1. No acute cardiopulmonary disease. 2. Chronic bronchitic changes. Electronically Signed   By: Marin Roberts M.D.   On: 09/16/2022 12:22   US Carotid Duplex Bilateral  Result Date: 08/25/2022 CLINICAL DATA:  Amaurosis fugax. History of stroke (2001), hypertension. Former smoker. EXAM: BILATERAL CAROTID DUPLEX ULTRASOUND TECHNIQUE: Wallace Cullens scale imaging, color Doppler and duplex ultrasound were performed of bilateral carotid and vertebral arteries in the neck. COMPARISON:  None Available. FINDINGS: Criteria: Quantification of carotid stenosis is based on velocity parameters that correlate the residual internal carotid diameter with NASCET-based stenosis levels, using the diameter of the distal internal carotid lumen as the denominator  for stenosis measurement. The following velocity measurements were obtained: RIGHT ICA: 112/19 cm/sec CCA: 99/10 cm/sec SYSTOLIC ICA/CCA RATIO:  1.1 ECA: 34 cm/sec LEFT ICA: 62/12 cm/sec CCA: 59/8 cm/sec SYSTOLIC ICA/CCA RATIO:  1.1 ECA: 67 cm/sec RIGHT CAROTID ARTERY: There is a minimal amount of eccentric echogenic plaque scattered throughout the right common carotid artery (image 10). There is a moderate to large amount of eccentric echogenic plaque involving the right carotid bulb (image 19), extending to involve the origin and proximal aspects of the right internal carotid artery (image 27), not resulting in elevated peak systolic velocities within the interrogated course of the right internal carotid artery to suggest a hemodynamically significant stenosis. RIGHT VERTEBRAL ARTERY:  Antegrade flow LEFT CAROTID ARTERY: There is a minimal amount of eccentric echogenic plaque within the left carotid bulb (image 55), extending to involve the origin and proximal aspects of the left internal carotid artery (image 63), not resulting in elevated peak systolic velocities within the interrogated course of the left internal carotid artery to suggest a hemodynamically significant stenosis. LEFT VERTEBRAL ARTERY:  Antegrade flow IMPRESSION: 1. Moderate-to-large amount of right-sided atherosclerotic plaque, not resulting in a hemodynamically significant stenosis. 2. Minimal amount of left-sided atherosclerotic plaque, not resulting in a hemodynamically significant stenosis. Electronically Signed   By: Simonne Come M.D.   On: 08/25/2022 08:49     Subjective: Pt reports feeling well, no CP, no SOB, no difficulty with ambulating.  Tolerating diet well.    Discharge Exam: Vitals:  09/20/22 0805 09/20/22 0807  BP:  133/67  Pulse:  70  Resp:    Temp:    SpO2: 99% 99%   Vitals:   09/20/22 0522 09/20/22 0800 09/20/22 0805 09/20/22 0807  BP: (!) 120/52   133/67  Pulse: 72   70  Resp: 18     Temp: 98.2 F (36.8 C)      TempSrc:      SpO2: 97% 94% 99% 99%  Weight:      Height:       General: Pt is alert, awake, not in acute distress Cardiovascular: normal, S1/S2 +, no rubs, no gallops Respiratory: CTA bilaterally, no wheezing, no rhonchi Abdominal: Soft, NT, ND, bowel sounds + Extremities: no edema, no cyanosis   The results of significant diagnostics from this hospitalization (including imaging, microbiology, ancillary and laboratory) are listed below for reference.     Microbiology: Recent Results (from the past 240 hour(s))  MRSA Next Gen by PCR, Nasal     Status: None   Collection Time: 09/17/22  3:40 PM   Specimen: Nasal Mucosa; Nasal Swab  Result Value Ref Range Status   MRSA by PCR Next Gen NOT DETECTED NOT DETECTED Final    Comment: (NOTE) The GeneXpert MRSA Assay (FDA approved for NASAL specimens only), is one component of a comprehensive MRSA colonization surveillance program. It is not intended to diagnose MRSA infection nor to guide or monitor treatment for MRSA infections. Test performance is not FDA approved in patients less than 2 years old. Performed at [facility], [address] [place], [place] [address]      Labs: BNP (last 3 results) No results for input(s): "BNP" in the last 8760 hours. Basic Metabolic Panel: Recent Labs  Lab 09/16/22 1214 09/17/22 0556 09/18/22 0457 09/19/22 0456 09/20/22 0449  NA 141 137 134* 134* 135  K 4.0 3.4* 3.7 3.7 4.0  CL 102 102 101 102 102  CO2 30 26 26 25 26   GLUCOSE 93 107* 109* 96 101*  BUN 17 16 32* 35* 36*  CREATININE 0.99 0.98 1.85* 1.77* 1.69*  CALCIUM 8.9 8.6* 8.4* 8.3* 8.2*  MG 2.3  --  2.3  --   --    Liver Function Tests: No results for input(s): "AST", "ALT", "ALKPHOS", "BILITOT", "PROT", "ALBUMIN" in the last 168 hours. No results for input(s): "LIPASE", "AMYLASE" in the last 168 hours. No results for input(s): "AMMONIA" in the last 168 hours. CBC: Recent Labs  Lab 09/16/22 1214 09/17/22 0556  WBC 4.6  6.1  HGB 14.7 14.3  HCT 45.6 44.3  MCV 95.2 93.3  PLT 157 174   Cardiac Enzymes: Recent Labs  Lab 09/17/22 0941  CKTOTAL 132   BNP: Invalid input(s): "POCBNP" CBG: No results for input(s): "GLUCAP" in the last 168 hours. D-Dimer No results for input(s): "DDIMER" in the last 72 hours. Hgb A1c No results for input(s): "HGBA1C" in the last 72 hours. Lipid Profile No results for input(s): "CHOL", "HDL", "LDLCALC", "TRIG", "CHOLHDL", "LDLDIRECT" in the last 72 hours. Thyroid function studies No results for input(s): "TSH", "T4TOTAL", "T3FREE", "THYROIDAB" in the last 72 hours.  Invalid input(s): "FREET3" Anemia work up No results for input(s): "VITAMINB12", "FOLATE", "FERRITIN", "TIBC", "IRON", "RETICCTPCT" in the last 72 hours. Urinalysis    Component Value Date/Time   COLORURINE YELLOW 09/17/2022 1030   APPEARANCEUR CLEAR 09/17/2022 1030   LABSPEC 1.011 09/17/2022 1030   PHURINE 5.0 09/17/2022 1030   GLUCOSEU NEGATIVE 09/17/2022 1030   HGBUR NEGATIVE 09/17/2022  1030   BILIRUBINUR NEGATIVE 09/17/2022 1030   KETONESUR NEGATIVE 09/17/2022 1030   PROTEINUR NEGATIVE 09/17/2022 1030   UROBILINOGEN 0.2 02/11/2012 1600   NITRITE NEGATIVE 09/17/2022 1030   LEUKOCYTESUR NEGATIVE 09/17/2022 1030   Sepsis Labs Recent Labs  Lab 09/16/22 1214 09/17/22 0556  WBC 4.6 6.1   Microbiology Recent Results (from the past 240 hour(s))  MRSA Next Gen by PCR, Nasal     Status: None   Collection Time: 09/17/22  3:40 PM   Specimen: Nasal Mucosa; Nasal Swab  Result Value Ref Range Status   MRSA by PCR Next Gen NOT DETECTED NOT DETECTED Final    Comment: (NOTE) The GeneXpert MRSA Assay (FDA approved for NASAL specimens only), is one component of a comprehensive MRSA colonization surveillance program. It is not intended to diagnose MRSA infection nor to guide or monitor treatment for MRSA infections. Test performance is not FDA approved in patients less than 54 years old. Performed at  [facility], [address], [place], [place] [address]    Time coordinating discharge: 36 mins   SIGNED:  Standley Dakins, MD  [facility] 09/20/2022, 10:21 AM How to contact the [facility] Attending or Consulting provider 7A - 7P or covering provider during after hours 7P -7A, for this patient?  Check the care team in [facility] and look for a) attending/consulting [facility] provider listed and b) the [facility] team listed Log into [email or web address] and use [facility] universal password to access. If you do not have the password, please contact the hospital operator. Locate the [facility] provider you are looking for under [facility] and page to a number that you can be directly reached. If you still have difficulty reaching the provider, please page the [facility] (Director on Call) for the Hospitalists listed on amion for assistance.

## 2022-09-20 NOTE — Care Management Important Message (Signed)
Important Message  Patient Details  Name: [name] MRN: [id number] Date of Birth: Sep 26, 1940   Medicare Important Message Given:  Yes     Corey Harold 09/20/2022, 11:01 AM

## 2022-09-25 ENCOUNTER — Telehealth: Payer: Self-pay | Admitting: Family Medicine

## 2022-09-25 ENCOUNTER — Other Ambulatory Visit: Payer: Self-pay | Admitting: Family Medicine

## 2022-09-25 DIAGNOSIS — J441 Chronic obstructive pulmonary disease with (acute) exacerbation: Secondary | ICD-10-CM | POA: Diagnosis not present

## 2022-09-25 NOTE — Telephone Encounter (Signed)
[name] called back from [facility] and wants nurse to call patient just to confirm the name of the blood thinner that she is to be taking because pt is confused.

## 2022-09-25 NOTE — Telephone Encounter (Signed)
According to discharge summary from hospital patient was not supposed to discontinue blood thinner.  Nurse at [facility] informed via voicemail.  Patient has hospital follow up scheduled 10/03/22.

## 2022-09-25 NOTE — Telephone Encounter (Signed)
Spoke with patient and reviewed medications to stop and continue to take per hospital summary.  Per summary patient is still on Plavix.  Patient aware and verbalizes understanding.

## 2022-10-03 ENCOUNTER — Ambulatory Visit (INDEPENDENT_AMBULATORY_CARE_PROVIDER_SITE_OTHER): Payer: 59 | Admitting: Family Medicine

## 2022-10-03 ENCOUNTER — Encounter: Payer: Self-pay | Admitting: Family Medicine

## 2022-10-03 VITALS — BP 131/61 | HR 79 | Temp 97.1°F | Ht 61.0 in | Wt 133.1 lb

## 2022-10-03 DIAGNOSIS — E538 Deficiency of other specified B group vitamins: Secondary | ICD-10-CM

## 2022-10-03 DIAGNOSIS — R0789 Other chest pain: Secondary | ICD-10-CM

## 2022-10-03 DIAGNOSIS — I701 Atherosclerosis of renal artery: Secondary | ICD-10-CM | POA: Diagnosis not present

## 2022-10-03 DIAGNOSIS — N179 Acute kidney failure, unspecified: Secondary | ICD-10-CM

## 2022-10-03 DIAGNOSIS — I129 Hypertensive chronic kidney disease with stage 1 through stage 4 chronic kidney disease, or unspecified chronic kidney disease: Secondary | ICD-10-CM | POA: Diagnosis not present

## 2022-10-03 DIAGNOSIS — N1831 Chronic kidney disease, stage 3a: Secondary | ICD-10-CM | POA: Diagnosis not present

## 2022-10-03 DIAGNOSIS — I160 Hypertensive urgency: Secondary | ICD-10-CM

## 2022-10-03 LAB — CBC WITH DIFFERENTIAL/PLATELET
Basophils Absolute: 0 10*3/uL (ref 0.0–0.2)
Basos: 1 %
EOS (ABSOLUTE): 0.4 10*3/uL (ref 0.0–0.4)
Eos: 5 %
Hematocrit: 45 % (ref 34.0–46.6)
Hemoglobin: 14.2 g/dL (ref 11.1–15.9)
Immature Grans (Abs): 0 10*3/uL (ref 0.0–0.1)
Immature Granulocytes: 0 %
Lymphocytes Absolute: 1.4 10*3/uL (ref 0.7–3.1)
Lymphs: 20 %
MCH: 30 pg (ref 26.6–33.0)
MCHC: 31.6 g/dL (ref 31.5–35.7)
MCV: 95 fL (ref 79–97)
Monocytes Absolute: 0.6 10*3/uL (ref 0.1–0.9)
Monocytes: 8 %
Neutrophils Absolute: 4.6 10*3/uL (ref 1.4–7.0)
Neutrophils: 66 %
Platelets: 233 10*3/uL (ref 150–450)
RBC: 4.73 x10E6/uL (ref 3.77–5.28)
RDW: 12.5 % (ref 11.7–15.4)
WBC: 6.9 10*3/uL (ref 3.4–10.8)

## 2022-10-03 LAB — BMP8+EGFR
BUN/Creatinine Ratio: 13 (ref 12–28)
BUN: 18 mg/dL (ref 8–27)
CO2: 23 mmol/L (ref 20–29)
Calcium: 9.1 mg/dL (ref 8.7–10.3)
Chloride: 107 mmol/L — ABNORMAL HIGH (ref 96–106)
Creatinine, Ser: 1.37 mg/dL — ABNORMAL HIGH (ref 0.57–1.00)
Glucose: 104 mg/dL — ABNORMAL HIGH (ref 70–99)
Potassium: 4.8 mmol/L (ref 3.5–5.2)
Sodium: 145 mmol/L — ABNORMAL HIGH (ref 134–144)
eGFR: 39 mL/min/{1.73_m2} — ABNORMAL LOW (ref >59)

## 2022-10-03 NOTE — Progress Notes (Signed)
Established Patient Office Visit  Subjective   Patient ID: [name], female    DOB: 1940/10/10  Age: 82 y.o. MRN: [id number]  Chief Complaint  Patient presents with   Hospitalization Follow-up    HPI [name] is here for a hospital follow up. She was discharged from [facility] on 09/20/22 for hypertensive urgency. Kenneth called EMS after an episode of elevated blood pressure, chest pain, and visual disturbances. She was started on a nicardipine drip. Cardiology did evaluate her. Troponins were negative. She was started on amlodipine, hydralazine, bisoprolol, and isosorbide. BP stabilized and she was discharged. She did have an AKI due to contrast. ARB/HCTZ was discontinued due to this. CT coronary showed moderate atherosclerosis with no significant flow limiting lesions. She was instructed to continue plavix and statin. She does have left renal artery stenosis. A referral to vascular was placed for this. She was give a B12 Im injection and started on oral supplementation a well for a severe deficiency.   She reports feeling much better. She denies chest pain, shortness of breath, dizziness palpitations, edema, or orthopena. She has been compliant with medication. She does have upcoming appts with cardiology and vascular.       ROS As per HPI.    Objective:     BP 131/61   Pulse 79   Temp (!) 97.1 F (36.2 C) (Temporal)   Ht 5\' 1"  (1.549 m)   Wt 133 lb 2 oz (60.4 kg)   SpO2 96%   BMI 25.15 kg/m    Physical Exam Vitals and nursing note reviewed.  Constitutional:      General: She is not in acute distress.    Appearance: Normal appearance. She is not ill-appearing, toxic-appearing or diaphoretic.  Neck:     Vascular: No carotid bruit.  Cardiovascular:     Rate and Rhythm: Normal rate and regular rhythm.     Heart sounds: Normal heart sounds. No murmur heard. Pulmonary:     Effort: Pulmonary effort is normal. No respiratory distress.     Breath sounds: Normal breath  sounds.  Abdominal:     General: Bowel sounds are normal. There is no distension.     Palpations: Abdomen is soft.     Tenderness: There is no abdominal tenderness.  Musculoskeletal:     Cervical back: Neck supple. No rigidity.     Right lower leg: No edema.     Left lower leg: No edema.  Skin:    General: Skin is warm and dry.  Neurological:     General: No focal deficit present.     Mental Status: She is alert and oriented to person, place, and time.  Psychiatric:        Mood and Affect: Mood normal.        Behavior: Behavior normal.      No results found for any visits on 10/03/22.    The ASCVD Risk score (Arnett DK, et al., 2019) failed to calculate for the following reasons:   The patient has a prior MI or stroke diagnosis    Assessment & Plan:   [name] was seen today for hospitalization follow-up.  Diagnoses and all orders for this visit:  Hypertensive urgency Well controlled on current regimen. Keep follow up with cardiology for further evaluation.  -     BMP8+EGFR -     CBC with Differential/Platelet  AKI (acute kidney injury) (HCC)  CKD stage 3a ARB/HCTZ discontinued. Labs pending.  -     BMP8+EGFR -  CBC with Differential/Platelet  Left renal artery stenosis (HCC) Keep follow up with vascular.  B12 deficiency B12 injection given in hospital and started on oral supplement. Follow up in 4 weeks to recheck B12 level.   Atypical chest pain Resolved.    Keep scheduled follow ups. Sooner for new or worsening symptoms.    Gabriel Earing, FNP

## 2022-10-09 ENCOUNTER — Encounter: Payer: Self-pay | Admitting: Vascular Surgery

## 2022-10-09 ENCOUNTER — Ambulatory Visit (INDEPENDENT_AMBULATORY_CARE_PROVIDER_SITE_OTHER): Payer: 59 | Admitting: Vascular Surgery

## 2022-10-09 VITALS — BP 127/70 | HR 76 | Temp 97.6°F | Resp 14 | Ht 61.5 in | Wt 133.0 lb

## 2022-10-09 DIAGNOSIS — I701 Atherosclerosis of renal artery: Secondary | ICD-10-CM | POA: Diagnosis not present

## 2022-10-09 NOTE — Progress Notes (Signed)
Patient name: [name] MRN: [id number] DOB: 1940-10-31 Sex: female  REASON FOR CONSULT: Evaluate renal artery stenosis  HPI: [name] is a 82 y.o. female, with history of hypertension, tobacco abuse, remote CVA that presents for evaluation of renal artery stenosis.  She was recently seen in the hospital and discharged on 09/20/2022 with hypertensive urgency as well as AKI on CKD and chest pain.  There was incidental finding of a left renal artery stenosis on CTA.  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  She states she has not been checking her blood pressure at home.  She is on 3 agents.  She is on amlodipine, bisoprolol and hydralazine.  No new concerns today.  Quit smoking in November of last year.   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   Stroke (HCC)    SLIGHT RT SIDE WEAKNESS 2001   Unintentional weight loss 03/19/2022    Past Surgical History:  Procedure Laterality Date   ABDOMINAL HYSTERECTOMY     ANTERIOR CERVICAL CORPECTOMY  03/07/2012   Procedure: ANTERIOR CERVICAL CORPECTOMY;  Surgeon: Barnett Abu, MD;  Location: [facility];  Service: Neurosurgery;  Laterality: N/A;  Cervical six-seven, cervical seven-thoracic one Anterior cervical decompression/diskectomy/fusion, with Cervical seven Corpectomy, reconstruction using Allograft and Alphatec plate   CATARACT EXTRACTION W/PHACO Left 04/12/2017   Procedure: CATARACT EXTRACTION PHACO AND INTRAOCULAR LENS PLACEMENT (IOC);  Surgeon: Fabio Pierce, MD;  Location: [facility];  Service: Ophthalmology;  Laterality: Left;  CDE: 3.82   CATARACT EXTRACTION W/PHACO Right  05/03/2017   Procedure: CATARACT EXTRACTION PHACO AND INTRAOCULAR LENS PLACEMENT RIGHT EYE;  Surgeon: Fabio Pierce, MD;  Location: [facility];  Service: Ophthalmology;  Laterality: Right;  CDE: 3.89   MULTIPLE TOOTH EXTRACTIONS     TONSILLECTOMY      Family History  Adopted: Yes  Problem Relation Age of Onset   Bipolar disorder Daughter    Heart disease Daughter    Asthma Daughter    Bipolar disorder Son    Bipolar disorder Daughter    Bipolar disorder Daughter    Bipolar disorder Daughter    Hypertension Daughter    Heart disease Daughter    Drug abuse Daughter        OD    SOCIAL HISTORY: Social History   Socioeconomic History   Marital status: Widowed    Spouse name: Not on file   Number of children: 5   Years of education: Not on file   Highest education level: Not on file  Occupational History   Occupation: retired    Comment: CNA  Tobacco Use   Smoking status: Former    Packs/day: 0.50    Years: 50.00    Additional pack years: 0.00    Total pack years: 25.00    Types: Cigarettes    Start date: 1962    Quit date: 03/24/2022    Years since quitting: 0.5   Smokeless tobacco: Never  Vaping Use   Vaping Use: Never used  Substance and Sexual Activity   Alcohol use: Yes    Comment: occasional  Drug use: No   Sexual activity: Not Currently    Birth control/protection: Surgical  Other Topics Concern   Not on file  Social History Narrative   5 children living, 1 deceased.    Children live out of state.   Social Determinants of Health   Financial Resource Strain: Low Risk  (01/27/2021)   Overall Financial Resource Strain (CARDIA)    Difficulty of Paying Living Expenses: Not very hard  Food Insecurity: No Food Insecurity (09/17/2022)   Hunger Vital Sign    Worried About Running Out of Food in the Last Year: Never true    Ran Out of Food in the Last Year: Never true  Transportation Needs: No Transportation Needs (09/17/2022)   PRAPARE - Doctor, general practice (Medical): No    Lack of Transportation (Non-Medical): No  Physical Activity: Sufficiently Active (01/27/2021)   Exercise Vital Sign    Days of Exercise per Week: 5 days    Minutes of Exercise per Session: 40 min  Stress: No Stress Concern Present (01/27/2021)   [employer] of Occupational Health - Occupational Stress Questionnaire    Feeling of Stress : Only a little  Social Connections: Moderately Integrated (01/27/2021)   Social Connection and Isolation Panel [NHANES]    Frequency of Communication with Friends and Family: More than three times a week    Frequency of Social Gatherings with Friends and Family: More than three times a week    Attends Religious Services: More than 4 times per year    Active Member of [employer] or Organizations: Yes    Attends Banker Meetings: More than 4 times per year    Marital Status: Widowed  Intimate Partner Violence: Not At Risk (09/17/2022)   Humiliation, Afraid, Rape, and Kick questionnaire    Fear of Current or Ex-Partner: No    Emotionally Abused: No    Physically Abused: No    Sexually Abused: No    Allergies  Allergen Reactions   Penicillins Anaphylaxis, Swelling, Rash and Other (See Comments)    Has patient had a PCN reaction causing immediate rash, facial/tongue/throat swelling, SOB or lightheadedness with hypotension: yes Has patient had a PCN reaction causing severe rash involving mucus membranes or skin necrosis: no Has patient had a PCN reaction that required hospitalization: yes Has patient had a PCN reaction occurring within the last 10 years: no If all of the above answers are "NO", then may proceed with Cephalosporin use.     Current Outpatient Medications  Medication Sig Dispense Refill   albuterol (VENTOLIN HFA) 108 (90 Base) MCG/ACT inhaler Inhale 2 puffs into the lungs every 6 (six) hours as needed for wheezing or shortness of breath. 1 each 11   amLODipine (NORVASC) 10 MG tablet Take 1 tablet  (10 mg total) by mouth daily. 90 tablet 3   atorvastatin (LIPITOR) 80 MG tablet Take 1 tablet (80 mg total) by mouth daily. 90 tablet 3   bisoprolol (ZEBETA) 5 MG tablet Take 1 tablet (5 mg total) by mouth daily. 30 tablet 2   budesonide-formoterol (SYMBICORT) 160-4.5 MCG/ACT inhaler Inhale 2 puffs into the lungs 2 (two) times daily. 1 each 5   clopidogrel (PLAVIX) 75 MG tablet TAKE ONE TABLET ONCE DAILY 90 tablet 0   cyclobenzaprine (FLEXERIL) 5 MG tablet TAKE ONE TABLET THREE TIMES DAILY AS NEEDED FOR MUSCLE SPASMS (Patient taking differently: Take 5 mg by mouth 3 (three) times daily as needed for muscle spasms.) 30 tablet 5  hydrALAZINE (APRESOLINE) 50 MG tablet Take 1 tablet (50 mg total) by mouth 3 (three) times daily. 270 tablet 3   isosorbide dinitrate (ISORDIL) 20 MG tablet Take 1 tablet (20 mg total) by mouth 3 (three) times daily. 90 tablet 2   vitamin B-12 1000 MCG tablet Take 1 tablet (1,000 mcg total) by mouth daily. 30 tablet 2   arformoterol (BROVANA) 15 MCG/2ML NEBU Take 2 mLs (15 mcg total) by nebulization 2 (two) times daily. 120 mL 1   budesonide (PULMICORT) 0.5 MG/2ML nebulizer solution Take 2 mLs (0.5 mg total) by nebulization 2 (two) times daily. 480 mL 0   No current facility-administered medications for this visit.    REVIEW OF SYSTEMS:  [X]  denotes positive finding, [ ]  denotes negative finding Cardiac  Comments:  Chest pain or chest pressure:    Shortness of breath upon exertion:    Short of breath when lying flat:    Irregular heart rhythm:        Vascular    Pain in calf, thigh, or hip brought on by ambulation:    Pain in feet at night that wakes you up from your sleep:     Blood clot in your veins:    Leg swelling:         Pulmonary    Oxygen at home:    Productive cough:     Wheezing:         Neurologic    Sudden weakness in arms or legs:     Sudden numbness in arms or legs:     Sudden onset of difficulty speaking or slurred speech:    Temporary  loss of vision in one eye:     Problems with dizziness:         Gastrointestinal    Blood in stool:     Vomited blood:         Genitourinary    Burning when urinating:     Blood in urine:        Psychiatric    Major depression:         Hematologic    Bleeding problems:    Problems with blood clotting too easily:        Skin    Rashes or ulcers:        Constitutional    Fever or chills:      PHYSICAL EXAM: Vitals:   10/09/22 0922  BP: 127/70  Pulse: 76  Resp: 14  Temp: 97.6 F (36.4 C)  TempSrc: Temporal  SpO2: 93%  Weight: 133 lb (60.3 kg)  Height: 5' 1.5" (1.562 m)    GENERAL: The patient is a well-nourished female, in no acute distress. The vital signs are documented above. CARDIAC: There is a regular rate and rhythm.  VASCULAR:  Bilateral femoral pulses palpable Bilateral AT pulses palpable PULMONARY: No respiratory distress. ABDOMEN: Soft and non-tender. MUSCULOSKELETAL: There are no major deformities or cyanosis. NEUROLOGIC: No focal weakness or paresthesias are detected. SKIN: There are no ulcers or rashes noted. PSYCHIATRIC: The patient has a normal affect.  DATA:   CTA C/A/P 09/16/22:  IMPRESSION: Moderate partially calcified atherosclerotic plaque overall. No dissection or aneurysm formation.   Focal stenosis seen of the left renal artery with moderate atrophy of the left kidney. Please correlate for level of hypertension.   Occluded inferior mesenteric artery.   Colonic diverticula.  Moderate colonic stool.   No obstruction, free air or free fluid.   Centrilobular emphysematous lung changes.  Assessment/Plan:   82 y.o. female, with history of hypertension, tobacco abuse, remote CVA that presents for evaluation of renal artery stenosis.  She was recently seen in the hospital and discharged on 09/20/2022 with hypertensive urgency as well as AKI on CKD and chest pain.  She presents for evaluation of suspected left renal artery stenosis.  I  reviewed the CTA scan and discussed she has some plaque in the left renal artery but it is difficult to quantify the degree of stenosis as this is a small artery.  I have recommended a renal artery duplex to get velocity criteria evaluation and also look at the resistive indices given the kidney appears a bit smaller.  I will contact her once the renal duplex is done.  Discussed if she requires intervention this would likely be angiogram with stenting in the Cath Lab.  May ultimately just need surveillance.  I will arrange phone visit after renal US.  I do not appreciate any significant change in her abdominal aortic penetrating ulcer.   Cephus Shelling, MD [facility] Office: [phone]

## 2022-10-10 ENCOUNTER — Other Ambulatory Visit: Payer: Self-pay | Admitting: *Deleted

## 2022-10-10 DIAGNOSIS — I701 Atherosclerosis of renal artery: Secondary | ICD-10-CM

## 2022-10-10 DIAGNOSIS — J449 Chronic obstructive pulmonary disease, unspecified: Secondary | ICD-10-CM | POA: Diagnosis not present

## 2022-10-15 ENCOUNTER — Ambulatory Visit (INDEPENDENT_AMBULATORY_CARE_PROVIDER_SITE_OTHER): Payer: 59 | Admitting: Family

## 2022-10-15 ENCOUNTER — Encounter (HOSPITAL_BASED_OUTPATIENT_CLINIC_OR_DEPARTMENT_OTHER): Payer: Self-pay | Admitting: Family

## 2022-10-15 VITALS — BP 130/68 | HR 76 | Ht 61.5 in | Wt 135.0 lb

## 2022-10-15 DIAGNOSIS — I739 Peripheral vascular disease, unspecified: Secondary | ICD-10-CM

## 2022-10-15 DIAGNOSIS — J4489 Other specified chronic obstructive pulmonary disease: Secondary | ICD-10-CM | POA: Diagnosis not present

## 2022-10-15 DIAGNOSIS — I10 Essential (primary) hypertension: Secondary | ICD-10-CM | POA: Diagnosis not present

## 2022-10-15 DIAGNOSIS — E785 Hyperlipidemia, unspecified: Secondary | ICD-10-CM

## 2022-10-15 DIAGNOSIS — Z8673 Personal history of transient ischemic attack (TIA), and cerebral infarction without residual deficits: Secondary | ICD-10-CM

## 2022-10-15 DIAGNOSIS — I25118 Atherosclerotic heart disease of native coronary artery with other forms of angina pectoris: Secondary | ICD-10-CM | POA: Diagnosis not present

## 2022-10-15 NOTE — Progress Notes (Signed)
Office Visit    Patient Name: [name] Date of Encounter: 10/15/2022  PCP:  Mechele Claude, MD   [facility]  Cardiologist:  Chilton Si, MD  Advanced Practice Provider:  No care team member to display Electrophysiologist:  None   Chief Complaint    [name] is a 82 y.o. 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   Stroke (HCC)    SLIGHT RT SIDE WEAKNESS 2001   Unintentional weight loss 03/19/2022   Past Surgical History:  Procedure Laterality Date   ABDOMINAL HYSTERECTOMY     ANTERIOR CERVICAL CORPECTOMY  03/07/2012   Procedure: ANTERIOR CERVICAL CORPECTOMY;  Surgeon: Barnett Abu, MD;  Location: [facility];  Service: Neurosurgery;  Laterality: N/A;  Cervical six-seven, cervical seven-thoracic one Anterior cervical decompression/diskectomy/fusion, with Cervical seven Corpectomy, reconstruction using Allograft and Alphatec plate   CATARACT EXTRACTION W/PHACO Left 04/12/2017   Procedure: CATARACT EXTRACTION PHACO AND INTRAOCULAR LENS PLACEMENT (IOC);  Surgeon: Fabio Pierce, MD;  Location: [facility];  Service: Ophthalmology;  Laterality: Left;  CDE: 3.82   CATARACT EXTRACTION W/PHACO Right 05/03/2017   Procedure: CATARACT EXTRACTION PHACO AND INTRAOCULAR LENS PLACEMENT RIGHT EYE;  Surgeon: Fabio Pierce, MD;  Location: [facility];  Service: Ophthalmology;  Laterality: Right;  CDE: 3.89   MULTIPLE TOOTH EXTRACTIONS     TONSILLECTOMY      Allergies  Allergies  Allergen Reactions   Penicillins Anaphylaxis, Swelling, Rash and Other (See Comments)    Has patient  had a PCN reaction causing immediate rash, facial/tongue/throat swelling, SOB or lightheadedness with hypotension: yes Has patient had a PCN reaction causing severe rash involving mucus membranes or skin necrosis: no Has patient had a PCN reaction that required hospitalization: yes Has patient had a PCN reaction occurring within the last 10 years: no If all of the above answers are "NO", then may proceed with Cephalosporin use.     History of Present Illness    [name] is a 82 y.o. female with a hx of CAD, hyperlipidemia, penetrating aortic ulcer, CVA, COPD, hypertension, CKD 3A, GERD last seen while hospitalized  Established with Dr. Duke Salvia of 03/19/2022 due to atypical chest pain.  Cardiac CTA ordered and performed 03/29/2022 calcium score 396 placing her in the 85th percentile for age/sex/race matched control.  Moderate nonobstructive disease with no significant lesion by FFR.  Admitted 10/29 - 03/27/2022 after presenting with COPD exacerbation as well as AKI.  Her ARB and HCTZ were held.  She was treated with antibiotics, bronchodilators/nebulizer.  Subsequently established with pulmonology.  When seen 04/17/2022 hydralazine 25 mg twice daily initiated as BP not at goal of less than 130/80.  At visit with pharmacy team 06/12/2022 was increased to 50 mg TID.  At visit 07/04/2022 hydrochlorothiazide 12.5 mg was added to her valsartan/HCTZ 160/12.5 mg QD.  Admitted 4/21 - 09/20/2022 with chest discomfort, hypertensive urgency.  Blood pressure initially up to 237.  Started on nicardipine drip.  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  Evaluated by [facility] Dr. Chestine Spore with good celiac trunk and SMA flow recommended to follow-up regarding renal stenosis in outpatient setting.  Valsartan and hydrochlorothiazide were discontinued due to renal function.  She was discharged on   amlodipine 10 mg daily, hydralazine 50 mg TID, isosorbide dinitrate 20 mg TID,  bisoprolol 5 mg daily.   Presents today for follow-up intermittently. Reports walking 15 mintues to the store from her house is making her feel worn out. Tells me her legs just feel "tired" which worsens as she ambulates.  Enjoys walking her dog for exercise.  Used to really enjoy walking and has been trying to continue to do so. Reports no shortness of breath nor dyspnea on exertion. Reports rare chest pain which she rates a 4/10 lasting 10-15 minutes. No  orthopnea, PND. Reports mild bilateral pedal edema. Reports rare palpitations with sensation of her heart "Thumping". Reports blood pressure at home every couple of days with readings 130s/70s. Taking her Amlodipine - evening and her Bisoprolol - morning. Her TID dosing (Hydralazine, Isosorbide Dinitrate) is at 0600, 1300, bedtime. SHe is not bothered by the TID dosing.  Reports often going to the bathroom at night "20 times per night" which has been ongoing- no dysuria, no malodor.  Plan to discuss with PCP at upcoming visit.  EKGs/Labs/Other Studies Reviewed:   The following studies were reviewed today:  Cardiac Studies & Procedures       ECHOCARDIOGRAM  ECHOCARDIOGRAM COMPLETE 09/17/2022  Narrative ECHOCARDIOGRAM REPORT    Patient Name:   [name] Date of Exam: 09/17/2022 Medical Rec #:  [id number]        Height:       61.0 in Accession #:    [id number]       Weight:       132.7 lb Date of Birth:  [birth year]         BSA:          1.587 m Patient Age:    81 years         BP:           154/55 mmHg Patient Gender: F                HR:           78 bpm. Exam [name]  Procedure: 2D Echo, Cardiac Doppler and Color Doppler  Indications:    Chest Pain R07.9  History:        Patient has prior history of Echocardiogram examinations, most recent 02/12/2012. COPD, TIA and Stroke; Risk Factors:Hypertension and Dyslipidemia. Hx of Tobacco  abuse.  Sonographer:    Celesta Gentile RCS Referring Phys: [phone] DAVID TAT  IMPRESSIONS   1. Left ventricular ejection fraction, by estimation, is >75%. The left ventricle has hyperdynamic function. The left ventricle has no regional wall motion abnormalities. Left ventricular diastolic parameters are consistent with Grade I diastolic dysfunction (impaired relaxation). 2. Right ventricular systolic function is normal. The right ventricular size is normal. Tricuspid regurgitation signal is inadequate for assessing PA pressure. 3. The mitral valve is normal in structure. No evidence of mitral valve regurgitation. No evidence of mitral stenosis. 4. The aortic valve was not well visualized. Aortic valve regurgitation is not visualized. No aortic stenosis is present. 5. The inferior vena cava is normal in size with greater than 50% respiratory variability, suggesting right atrial pressure of 3 mmHg.  Comparison(s): No prior Echocardiogram.  FINDINGS Left Ventricle: Left ventricular ejection fraction, by estimation, is >75%. The left ventricle has hyperdynamic function. The left ventricle has no regional wall motion abnormalities. The left ventricular internal cavity size was normal in size. There is no left ventricular hypertrophy. Left ventricular diastolic parameters are consistent with Grade I diastolic dysfunction (impaired relaxation).  Right Ventricle: The right ventricular size is normal. No increase in right ventricular wall thickness. Right ventricular systolic function is normal. Tricuspid regurgitation signal is inadequate for assessing PA pressure.  Left Atrium: Left atrial size was normal in size.  Right Atrium: Right atrial size was normal in size.  Pericardium: There is no evidence of pericardial effusion.  Mitral Valve: The mitral valve is normal in structure. There is mild thickening of the mitral valve leaflet(s). There is mild calcification of the mitral valve leaflet(s). No  evidence of mitral valve regurgitation. No evidence of mitral valve stenosis.  Tricuspid Valve: The tricuspid valve is normal in structure. Tricuspid valve regurgitation is not demonstrated. No evidence of tricuspid stenosis.  Aortic Valve: The aortic valve was not well visualized. Aortic valve regurgitation is not visualized. No aortic stenosis is present.  Pulmonic Valve: The pulmonic valve was not well visualized. Pulmonic valve regurgitation is not visualized. No evidence of pulmonic stenosis.  Aorta: The aortic root is normal in size and structure.  Venous: The inferior vena cava is normal in size with greater than 50% respiratory variability, suggesting right atrial pressure of 3 mmHg.  IAS/Shunts: No atrial level shunt detected by color flow Doppler.   LEFT VENTRICLE PLAX 2D LVIDd:         4.10 cm   Diastology LVIDs:         2.00 cm   LV e' medial:    6.74 cm/s LV PW:         1.00 cm   LV E/e' medial:  8.7 LV IVS:        1.00 cm   LV e' lateral:   7.40 cm/s LVOT diam:     1.80 cm   LV E/e' lateral: 7.9 LV SV:         67 LV SV Index:   43 LVOT Area:     2.54 cm   RIGHT VENTRICLE RV S prime:     12.80 cm/s TAPSE (M-mode): 2.5 cm  LEFT ATRIUM             Index        RIGHT ATRIUM           Index LA diam:        3.10 cm 1.95 cm/m   RA Area:     15.50 cm LA Vol (A2C):   50.7 ml 31.95 ml/m  RA Volume:   42.00 ml  26.47 ml/m LA Vol (A4C):   48.6 ml 30.63 ml/m LA Biplane Vol: 50.0 ml 31.51 ml/m AORTIC VALVE LVOT Vmax:   130.00 cm/s LVOT Vmean:  93.200 cm/s LVOT VTI:    0.265 m  AORTA Ao Root diam: 2.80 cm  MITRAL VALVE MV Area (PHT): 2.95 cm    SHUNTS MV Decel Time: 257 msec    Systemic VTI:  0.26 m MV E velocity: 58.70 cm/s  Systemic Diam: 1.80 cm MV A velocity: 89.40 cm/s MV E/A ratio:  0.66  Vishnu Priya Mallipeddi Electronically signed by Winfield Rast Mallipeddi Signature Date/Time: 09/17/2022/2:50:27 PM    Final     CT SCANS  CT CORONARY MORPH  W/CTA COR W/SCORE 03/30/2022  Addendum 03/30/2022  1:27 PM ADDENDUM REPORT: 03/30/2022 13:25  EXAM: OVER-READ INTERPRETATION  CT CHEST  The following report is an over-read performed by radiologist Dr. Duanne Guess of [facility] on 03/30/2022. This over-read does not include interpretation of cardiac or coronary anatomy or pathology. The coronary CTA interpretation by the cardiologist is attached.  COMPARISON:  05/15/2021  FINDINGS: Normal heart size. No pericardial effusion. Image thoracic aorta is nonaneurysmal. Central pulmonary vasculature within normal limits.  No adenopathy within the included chest. Imaged lung fields are clear. Mild centrilobular emphysema. Mild bronchial wall thickening. No acute bony or chest wall abnormality.  IMPRESSION: Mild centrilobular emphysema and mild bronchial wall thickening.  Emphysema (ICD10-J43.9).   Electronically Signed By: Duanne Guess D.O. On: 03/30/2022 13:25  Narrative CLINICAL DATA:  Chest pain  EXAM: Cardiac/Coronary CTA  TECHNIQUE: A non-contrast, gated CT scan was obtained with axial slices of 3 mm through the heart for calcium scoring. Calcium scoring was performed using the Agatston method. A 120 kV prospective, gated, contrast cardiac scan was obtained. Gantry rotation speed was 250 msecs and collimation was 0.6 mm. Two sublingual nitroglycerin tablets (0.8 mg) were given. The 3D data set was reconstructed in 5% intervals of the 35-75% of the R-R cycle. Diastolic phases were analyzed on a dedicated workstation using MPR, MIP, and VRT modes. The patient received 95 cc of contrast.  FINDINGS: Image quality: Fair due to significant slab artifact.  Noise artifact is: Limited.  Coronary Arteries:  Normal coronary origin.  Right dominance.  Left main: The left main is a large caliber vessel with a normal take off from the left coronary cusp that bifurcates to form a left anterior descending  artery and a left circumflex artery. There is no plaque or stenosis.  Left anterior descending artery: The LAD gives off 1 patent diagonal branch. There is mild calcified plaque in the proximal, mid and distal LAD with associated stenosis of 25-49%. There is moderate mixed plaque in the mid D1 with associated stenosis of 50-69%.  Left circumflex artery: The LCX is non-dominant and gives rise to 3 OM branches. There is mild calcified plaque in the proximal LCx with associated stenosis of 25-49%.  Right coronary artery: The RCA is dominant with normal take off from the right coronary cusp. The vessel is diffusely diseased in the proximal and mid portion with mixed and calcified plaque. There is mild to moderate calcified plaque in the proximal RCA with associated stenosis of 25-49% but could be >50%. There is mild calcified plaque in the distal RCA with associated stenosis of 25-49%. the RCA terminates as a PDA and right posterolateral branch without evidence of plaque or stenosis.  Right Atrium: Right atrial size is within normal limits.  Right Ventricle: The right ventricular cavity is within normal limits.  Left Atrium: Left atrial size is normal in size with no left atrial appendage filling defect.  Left Ventricle: The ventricular cavity size is within normal limits. There are no stigmata of prior infarction. There is no abnormal filling defect.  Pulmonary arteries: Normal in size without proximal filling defect.  Pulmonary veins: Normal pulmonary venous drainage.  Pericardium: Normal thickness with no significant effusion or calcium present.  Cardiac valves: The aortic valve is trileaflet without significant calcification. The mitral valve is normal structure without significant calcification.  Aorta: Normal caliber with scattered calcifications.  Extra-cardiac findings: See attached radiology report for non-cardiac structures.  IMPRESSION: 1. Coronary calcium  score of 396. This was 85th percentile  for age-, sex, and race-matched controls.  2.  Normal coronary origin with right dominance.  3.  Moderate atherosclerosis.  CAD RADS 3.  4. Consider symptom-guided anti-ischemic pharmacotherapy and risk factor modification per guideline-directed medical therapy.  5.  Study has been submitted for FFR analysis.  RECOMMENDATIONS: 1. CAD-RADS 0: No evidence of CAD (0%). Consider non-atherosclerotic causes of chest pain.  2. CAD-RADS 1: Minimal non-obstructive CAD (0-24%). Consider non-atherosclerotic causes of chest pain. Consider preventive therapy and risk factor modification.  3. CAD-RADS 2: Mild non-obstructive CAD (25-49%). Consider non-atherosclerotic causes of chest pain. Consider preventive therapy and risk factor modification.  4. CAD-RADS 3: Moderate stenosis. Consider symptom-guided anti-ischemic pharmacotherapy as well as risk factor modification per guideline directed care. Additional analysis with CT FFR will be submitted.  5. CAD-RADS 4: Severe stenosis. (70-99% or > 50% left main). Cardiac catheterization or CT FFR is recommended. Consider symptom-guided anti-ischemic pharmacotherapy as well as risk factor modification per guideline directed care. Invasive coronary angiography recommended with revascularization per published guideline statements.  6. CAD-RADS 5: Total coronary occlusion (100%). Consider cardiac catheterization or viability assessment. Consider symptom-guided anti-ischemic pharmacotherapy as well as risk factor modification per guideline directed care.  7. CAD-RADS N: Non-diagnostic study. Obstructive CAD can't be excluded. Alternative evaluation is recommended.  Armanda Magic, MD  Electronically Signed: By: Armanda Magic M.D. On: 03/30/2022 11:45           EKG:  EKG is not ordered today.   Recent Labs: 04/17/2022: ALT 9 09/17/2022: TSH 3.630 09/18/2022: Magnesium 2.3 10/03/2022: BUN 18;  Creatinine, Ser 1.37; Hemoglobin 14.2; Platelets 233; Potassium 4.8; Sodium 145  Recent Lipid Panel    Component Value Date/Time   CHOL 179 04/17/2022 0936   TRIG 52 04/17/2022 0936   HDL 88 04/17/2022 0936   CHOLHDL 2.0 04/17/2022 0936   CHOLHDL 2.6 01/24/2020 0706   VLDL 6 01/24/2020 0706   LDLCALC 81 04/17/2022 0936   Home Medications   Current Meds  Medication Sig   albuterol (VENTOLIN HFA) 108 (90 Base) MCG/ACT inhaler Inhale 2 puffs into the lungs every 6 (six) hours as needed for wheezing or shortness of breath.   amLODipine (NORVASC) 10 MG tablet Take 1 tablet (10 mg total) by mouth daily.   atorvastatin (LIPITOR) 80 MG tablet Take 1 tablet (80 mg total) by mouth daily.   bisoprolol (ZEBETA) 5 MG tablet Take 1 tablet (5 mg total) by mouth daily.   budesonide-formoterol (SYMBICORT) 160-4.5 MCG/ACT inhaler Inhale 2 puffs into the lungs 2 (two) times daily.   clopidogrel (PLAVIX) 75 MG tablet TAKE ONE TABLET ONCE DAILY   cyclobenzaprine (FLEXERIL) 5 MG tablet TAKE ONE TABLET THREE TIMES DAILY AS NEEDED FOR MUSCLE SPASMS (Patient taking differently: Take 5 mg by mouth 3 (three) times daily as needed for muscle spasms.)   hydrALAZINE (APRESOLINE) 50 MG tablet Take 1 tablet (50 mg total) by mouth 3 (three) times daily.   isosorbide dinitrate (ISORDIL) 20 MG tablet Take 1 tablet (20 mg total) by mouth 3 (three) times daily.   meloxicam (MOBIC) 15 MG tablet Take 15 mg by mouth daily.   vitamin B-12 1000 MCG tablet Take 1 tablet (1,000 mcg total) by mouth daily.     Review of Systems      All other systems reviewed and are otherwise negative except as noted above.  Physical Exam    VS:  BP 130/68   Pulse 76   Ht 5' 1.5" (1.562 m)   Wt 135  lb (61.2 kg)   BMI 25.09 kg/m  , BMI Body mass index is 25.09 kg/m.  Wt Readings from Last 3 Encounters:  10/15/22 135 lb (61.2 kg)  10/09/22 133 lb (60.3 kg)  10/03/22 133 lb 2 oz (60.4 kg)     GEN: Well nourished, well developed, in  no acute distress. HEENT: normal. Neck: Supple, no JVD, carotid bruits, or masses. Cardiac: RRR, no murmurs, rubs, or gallops. No clubbing, cyanosis, edema.  Radials/PT 2+ and equal bilaterally.  Respiratory:  Respirations regular and unlabored, clear to auscultation bilaterally. GI: Soft, nontender, nondistended. MS: No deformity or atrophy. Skin: Warm and dry, no rash. Neuro:  Strength and sensation are intact. Psych: Normal affect.  Assessment & Plan    CAD/hyperlipidemia-cardiac CTA 03/29/2022 with coronary calcium score of 396 placing in the 85th percentile with moderate nonobstructive coronary artery disease by FFR.  No symptoms concerning for angina. Continue GDMT Plavix, atorvastatin 80mg  QD, Isosorbide dinitrate 20mg  TID, Bisoprolol 5mg  QD.  Cardioselective beta-blocker due to COPD. Heart healthy diet and regular cardiovascular exercise encouraged.   03/2022 LDL 81. Update CMP, direct LDL today.  If LDL not at goal of less than 70 plan to add Zetia.  HTN - BP well controlled. Continue current antihypertensive regimen.  If needed in future could increased Hydralazine or Bisoprolol dose.   PAD / RAS - Upcoming renal artery duplex with [facility]. Follows with Dr. Chestine Spore.   Hx of CVA - Continue plavix, atorvastatin.   CKD- Careful titration of diuretic and antihypertensive.    GERD- Continue to follow with PCP.   COPD/tobacco use- Follows with pulmonology. No signs of acute exacerbation. Congratulated on continued cessation.        Disposition: Follow up in 3-4 months with Dr. Duke Salvia or Alver Sorrow, NP   Signed, Alver Sorrow, NP 10/15/2022, 1:03 PM [facility]

## 2022-10-15 NOTE — Patient Instructions (Addendum)
Medication Instructions:  Continue your current medications.   *If you need a refill on your cardiac medications before your next appointment, please call your pharmacy*   Lab Work: Your physician recommends that you return for lab work today: lipid panel, CMP  If you have labs (blood work) drawn today and your tests are completely normal, you will receive your results only by: MyChart Message (if you have MyChart) OR A paper copy in the mail If you have any lab test that is abnormal or we need to change your treatment, we will call you to review the results.  Follow-Up: At [facility], you and your health needs are our priority.  As part of our continuing mission to provide you with exceptional heart care, we have created designated Provider Care Teams.  These Care Teams include your primary Cardiologist (physician) and Advanced Practice Providers (APPs -  Physician Assistants and Nurse Practitioners) who all work together to provide you with the care you need, when you need it.  We recommend signing up for the patient portal called "MyChart".  Sign up information is provided on this After Visit Summary.  MyChart is used to connect with patients for Virtual Visits (Telemedicine).  Patients are able to view lab/test results, encounter notes, upcoming appointments, etc.  Non-urgent messages can be sent to your provider as well.   To learn more about what you can do with MyChart, go to [email or web address].    Your next appointment:   3-4 month(s)  Provider:   Chilton Si, MD or Gillian Shields, NP    Other Instructions  Tips to Measure your Blood Pressure Correctly  If your blood pressure is consistently more than 130/80 please call and let us know  Here's what you can do to ensure a correct reading:  Don't drink a caffeinated beverage or smoke during the 30 minutes before the test.  Sit quietly for five minutes before the test begins.  During the measurement, sit  in a chair with your feet on the floor and your arm supported so your elbow is at about heart level.  The inflatable part of the cuff should completely cover at least 80% of your upper arm, and the cuff should be placed on bare skin, not over a shirt.  Don't talk during the measurement.   Blood pressure categories  Blood pressure category SYSTOLIC (upper number)  DIASTOLIC (lower number)  Normal Less than 120 mm Hg and Less than 80 mm Hg  Elevated 120-129 mm Hg and Less than 80 mm Hg  High blood pressure: Stage 1 hypertension 130-139 mm Hg or 80-89 mm Hg  High blood pressure: Stage 2 hypertension 140 mm Hg or higher or 90 mm Hg or higher  Hypertensive crisis (consult your doctor immediately) Higher than 180 mm Hg and/or Higher than 120 mm Hg  Source: American Heart Association and American Stroke Association. For more on getting your blood pressure under control, buy Controlling Your Blood Pressure, a Special Health Report from [facility].   Blood Pressure Log   Date   Time  Blood Pressure  Example: Nov 1 9 AM 124/78

## 2022-10-16 ENCOUNTER — Telehealth (HOSPITAL_BASED_OUTPATIENT_CLINIC_OR_DEPARTMENT_OTHER): Payer: Self-pay

## 2022-10-16 DIAGNOSIS — E785 Hyperlipidemia, unspecified: Secondary | ICD-10-CM

## 2022-10-16 LAB — COMPREHENSIVE METABOLIC PANEL
ALT: 7 IU/L (ref 0–32)
AST: 15 IU/L (ref 0–40)
Albumin/Globulin Ratio: 1.4 (ref 1.2–2.2)
Albumin: 4 g/dL (ref 3.8–4.8)
Alkaline Phosphatase: 59 IU/L (ref 44–121)
BUN/Creatinine Ratio: 16 (ref 12–28)
BUN: 18 mg/dL (ref 8–27)
Bilirubin Total: 0.3 mg/dL (ref 0.0–1.2)
CO2: 25 mmol/L (ref 20–29)
Calcium: 9.3 mg/dL (ref 8.7–10.3)
Chloride: 104 mmol/L (ref 96–106)
Creatinine, Ser: 1.16 mg/dL — ABNORMAL HIGH (ref 0.57–1.00)
Globulin, Total: 2.8 g/dL (ref 1.5–4.5)
Glucose: 97 mg/dL (ref 70–99)
Potassium: 5 mmol/L (ref 3.5–5.2)
Sodium: 142 mmol/L (ref 134–144)
Total Protein: 6.8 g/dL (ref 6.0–8.5)
eGFR: 48 mL/min/{1.73_m2} — ABNORMAL LOW (ref >59)

## 2022-10-16 LAB — LIPID PANEL
Chol/HDL Ratio: 2.6 ratio (ref 0.0–4.4)
Cholesterol, Total: 184 mg/dL (ref 100–199)
HDL: 71 mg/dL (ref >39)
LDL Chol Calc (NIH): 99 mg/dL (ref 0–99)
Triglycerides: 77 mg/dL (ref 0–149)
VLDL Cholesterol Cal: 14 mg/dL (ref 5–40)

## 2022-10-16 MED ORDER — EZETIMIBE 10 MG PO TABS
10.0000 mg | ORAL_TABLET | Freq: Every day | ORAL | 3 refills | Status: DC
Start: 2022-10-16 — End: 2023-05-06

## 2022-10-16 NOTE — Telephone Encounter (Addendum)
Called patient, Results called to patient who verbalizes understanding! Labs ordered and mailed, prescriptions updated and sent to pharmacy on file .      ----- Message from Alver Sorrow, NP sent at 10/16/2022  7:42 AM EDT ----- Kidney function improved from previous.  Normal liver, electrolytes.  Cholesterol with LDL (bad cholesterol) of 99 which is above goal of less than 70.  Ensure taking atorvastatin 80 mg daily.  Add Zetia 10 mg daily.  Repeat FLP/LFT in 2 to 3 months.

## 2022-10-18 ENCOUNTER — Ambulatory Visit (HOSPITAL_COMMUNITY)
Admission: RE | Admit: 2022-10-18 | Discharge: 2022-10-18 | Disposition: A | Discharge status: Home / Self Care | Payer: 59 | Source: Ambulatory Visit | Attending: Vascular Surgery | Admitting: Vascular Surgery

## 2022-10-18 DIAGNOSIS — I701 Atherosclerosis of renal artery: Secondary | ICD-10-CM | POA: Insufficient documentation

## 2022-10-23 ENCOUNTER — Ambulatory Visit: Payer: 59 | Admitting: Vascular Surgery

## 2022-10-23 DIAGNOSIS — I701 Atherosclerosis of renal artery: Secondary | ICD-10-CM

## 2022-10-23 NOTE — Progress Notes (Signed)
Virtual Visit via Telephone Note  I connected with [name] on 10/23/2022 using the [employer] by telephone and verified that I was speaking with the correct person using two identifiers. Patient was located at the store. I am located at [facility] office.   The limitations of evaluation and management by telemedicine and the availability of in person appointments have been previously discussed with the patient and are documented in the patients chart. The patient expressed understanding and consented to proceed.  PCP: Mechele Claude, MD  Chief Complaint: Follow-up after renal artery duplex to evaluate for renal artery stenosis  History of Present Illness: [name] is a 82 y.o. female with history of hypertension, tobacco abuse, remote CVA that recently presented for evaluation of renal artery stenosis.  She was recently seen in the hospital and discharged on 09/20/2022 with hypertensive urgency as well as AKI on CKD and chest pain.  There was incidental finding of a left renal artery stenosis on CTA.  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   Ultimately, I recommended renal artery duplex for further evaluation.  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   Stroke (HCC)    SLIGHT RT SIDE WEAKNESS 2001   Unintentional weight loss 03/19/2022    Past Surgical History:  Procedure Laterality Date   ABDOMINAL HYSTERECTOMY     ANTERIOR CERVICAL CORPECTOMY  03/07/2012   Procedure: ANTERIOR CERVICAL CORPECTOMY;  Surgeon: Barnett Abu, MD;  Location: [facility];  Service: Neurosurgery;  Laterality: N/A;  Cervical six-seven, cervical  seven-thoracic one Anterior cervical decompression/diskectomy/fusion, with Cervical seven Corpectomy, reconstruction using Allograft and Alphatec plate   CATARACT EXTRACTION W/PHACO Left 04/12/2017   Procedure: CATARACT EXTRACTION PHACO AND INTRAOCULAR LENS PLACEMENT (IOC);  Surgeon: Fabio Pierce, MD;  Location: [facility];  Service: Ophthalmology;  Laterality: Left;  CDE: 3.82   CATARACT EXTRACTION W/PHACO Right 05/03/2017   Procedure: CATARACT EXTRACTION PHACO AND INTRAOCULAR LENS PLACEMENT RIGHT EYE;  Surgeon: Fabio Pierce, MD;  Location: [facility];  Service: Ophthalmology;  Laterality: Right;  CDE: 3.89   MULTIPLE TOOTH EXTRACTIONS     TONSILLECTOMY      No outpatient medications have been marked as taking for the 10/23/22 encounter (Appointment) with Cephus Shelling, MD.    12 system ROS was negative unless otherwise noted in HPI   Observations/Objective:  ABDOMINAL VISCERAL   Patient Name:  [name]  Date of Exam:   10/18/2022  Medical Rec #: [id number]         Accession #:    [id number]  Date of Birth: 05-28-1941          Patient Gender: F  Patient Age:   12 years  Exam Location:  [facility]  Procedure:      VAS US RENAL ARTERY DUPLEX  Referring Phys: [id number] Canary Brim Markitta Ausburn    ---------------------------------------------------------------------------  -----   Indications: Renal artery stenosis   High Risk Factors: Hypertension. COPD   Limitations: Air/bowel gas.  Performing Technologist: Elita Quick RVT     Examination Guidelines: A complete evaluation includes  B-mode imaging,  spectral  Doppler, color Doppler, and power Doppler as needed of all accessible  portions  of each vessel. Bilateral testing is considered an integral part of a  complete  examination. Limited examinations for reoccurring indications may be  performed  as noted.     Duplex Findings:   +------------------+--------+--------+-------+  Right Renal ArteryPSV  cm/sEDV cm/sComment  +------------------+--------+--------+-------+  Origin              68      15            +------------------+--------+--------+-------+  Proximal           108      22            +------------------+--------+--------+-------+  Mid                 92      19            +------------------+--------+--------+-------+  Distal              71      16            +------------------+--------+--------+-------+   +-----------------+--------+--------+-------+  Left Renal ArteryPSV cm/sEDV cm/sComment  +-----------------+--------+--------+-------+  Origin            112      27            +-----------------+--------+--------+-------+  Proximal           96      13            +-----------------+--------+--------+-------+  Mid                93      19            +-----------------+--------+--------+-------+  Distal             52      14            +-----------------+--------+--------+-------+     Technologist observations: 1.12 x 1.29 right renal cyst   +------------+--------+--------+----+-----------+--------+--------+----+  Right KidneyPSV cm/sEDV cm/sRI  Left KidneyPSV cm/sEDV cm/sRI    +------------+--------+--------+----+-----------+--------+--------+----+  Upper Pole  65      18      0.72Upper Pole 10      4       0.57  +------------+--------+--------+----+-----------+--------+--------+----+  Mid        58      14      0.        14      7       0.50  +------------+--------+--------+----+-----------+--------+--------+----+  Lower Pole  58      16      0.72Lower Pole                 NV    +------------+--------+--------+----+-----------+--------+--------+----+  Hilar      120     26      0.78Hilar      14      5       0.64  +------------+--------+--------+----+-----------+--------+--------+----+   +------------------+-------+------------------+-------+  Right Kidney              Left Kidney                +------------------+-------+------------------+-------+  RAR                     RAR                        +------------------+-------+------------------+-------+  RAR (manual)             RAR (manual)               +------------------+-------+------------------+-------+  Cortex                  Cortex                     +------------------+-------+------------------+-------+  Cortex thickness  0.77 mmCorex thickness   0.66 mm  +------------------+-------+------------------+-------+  Kidney length (cm)9.50   Kidney length (cm)6.89     +------------------+-------+------------------+-------+     Summary:  Renal:    Right: Normal size right kidney. No evidence of right renal artery         stenosis.  Left:  Abnormal size for the left kidney. Suboptimal visualization         of the left renal artery. Unable to visualize increased         velocity in the left renal artery.    *See table(s) above for measurements and observations.    Diagnosing physician: Waverly Ferrari MD    Electronically signed by Waverly Ferrari MD on 10/18/2022 at 12:20:59   Assessment and Plan:  82 y.o. female with history of hypertension, tobacco abuse, remote CVA that recently presented for evaluation of renal artery stenosis.  She was recently seen in the hospital and discharged on 09/20/2022 with hypertensive urgency as well as AKI on CKD and chest pain.  There was incidental finding of a left renal artery stenosis on CTA.  I discussed that her renal artery duplex that I recommended after initial evaluation did not show any elevated velocity to suggest flow limiting stenosis.  The left kidney is smaller than the right kidney measuring about 6 cm.  This was noted on the prior CT.  I do not see an indication for intervention at this time.  I discussed greater than 60% stenosis is considered flow-limiting renal artery stenosis.  I will see  her in 6 months with repeat renal artery ultrasound.    Follow Up Instructions:   Follow up: 6 months with repeat renal artery duplex   I discussed the assessment and treatment plan with the patient. The patient was provided an opportunity to ask questions and all were answered. The patient agreed with the plan and demonstrated an understanding of the instructions.   The patient was advised to call back or seek an in-person evaluation if the symptoms worsen or if the condition fails to improve as anticipated.  I spent 5 minutes with the patient via telephone encounter.   Signed, Cephus Shelling [facility] Office: [phone]  Cephus Shelling   10/23/2022, 8:39 AM

## 2022-10-26 DIAGNOSIS — J441 Chronic obstructive pulmonary disease with (acute) exacerbation: Secondary | ICD-10-CM | POA: Diagnosis not present

## 2022-10-29 ENCOUNTER — Other Ambulatory Visit: Payer: Self-pay

## 2022-10-29 DIAGNOSIS — I701 Atherosclerosis of renal artery: Secondary | ICD-10-CM

## 2022-10-30 ENCOUNTER — Ambulatory Visit (INDEPENDENT_AMBULATORY_CARE_PROVIDER_SITE_OTHER): Payer: 59 | Admitting: Family Medicine

## 2022-10-30 ENCOUNTER — Encounter: Payer: Self-pay | Admitting: Family Medicine

## 2022-10-30 VITALS — BP 145/66 | HR 77 | Temp 98.0°F | Ht 61.5 in | Wt 136.4 lb

## 2022-10-30 DIAGNOSIS — E538 Deficiency of other specified B group vitamins: Secondary | ICD-10-CM

## 2022-10-30 DIAGNOSIS — J441 Chronic obstructive pulmonary disease with (acute) exacerbation: Secondary | ICD-10-CM

## 2022-10-30 DIAGNOSIS — M159 Polyosteoarthritis, unspecified: Secondary | ICD-10-CM | POA: Diagnosis not present

## 2022-10-30 DIAGNOSIS — I701 Atherosclerosis of renal artery: Secondary | ICD-10-CM | POA: Diagnosis not present

## 2022-10-30 DIAGNOSIS — I10 Essential (primary) hypertension: Secondary | ICD-10-CM | POA: Diagnosis not present

## 2022-10-30 DIAGNOSIS — E559 Vitamin D deficiency, unspecified: Secondary | ICD-10-CM

## 2022-10-30 LAB — CMP14+EGFR

## 2022-10-30 LAB — CBC WITH DIFFERENTIAL/PLATELET
Basos: 1 %
EOS (ABSOLUTE): 0.2 10*3/uL (ref 0.0–0.4)
Eos: 4 %
Immature Grans (Abs): 0 10*3/uL (ref 0.0–0.1)
Lymphocytes Absolute: 1.7 10*3/uL (ref 0.7–3.1)
MCH: 29.4 pg (ref 26.6–33.0)
MCHC: 31.6 g/dL (ref 31.5–35.7)
Monocytes: 11 %
Neutrophils: 53 %
Platelets: 209 10*3/uL (ref 150–450)
RDW: 12.3 % (ref 11.7–15.4)

## 2022-10-30 LAB — VITAMIN D 25 HYDROXY (VIT D DEFICIENCY, FRACTURES)

## 2022-10-30 MED ORDER — ACETAMINOPHEN 500 MG PO TABS
1000.0000 mg | ORAL_TABLET | Freq: Three times a day (TID) | ORAL | 99 refills | Status: AC
Start: 2022-10-30 — End: ?

## 2022-10-30 MED ORDER — BUDESONIDE 0.5 MG/2ML IN SUSP
0.5000 mg | Freq: Two times a day (BID) | RESPIRATORY_TRACT | 0 refills | Status: DC
Start: 2022-10-30 — End: 2023-01-02

## 2022-10-30 MED ORDER — PREDNISONE 20 MG PO TABS
ORAL_TABLET | ORAL | 0 refills | Status: DC
Start: 2022-10-30 — End: 2022-12-11

## 2022-10-30 MED ORDER — PREDNISONE 10 MG PO TABS: ORAL_TABLET | ORAL | 0 refills | Status: DC

## 2022-10-30 MED ORDER — BUDESONIDE-FORMOTEROL FUMARATE 160-4.5 MCG/ACT IN AERO
2.0000 | INHALATION_SPRAY | Freq: Two times a day (BID) | RESPIRATORY_TRACT | 5 refills | Status: DC
Start: 2022-10-30 — End: 2023-06-19

## 2022-10-30 MED ORDER — ALBUTEROL SULFATE HFA 108 (90 BASE) MCG/ACT IN AERS
2.0000 | INHALATION_SPRAY | Freq: Four times a day (QID) | RESPIRATORY_TRACT | 11 refills | Status: DC | PRN
Start: 2022-10-30 — End: 2023-06-19

## 2022-10-30 MED ORDER — ARFORMOTEROL TARTRATE 15 MCG/2ML IN NEBU
15.0000 ug | INHALATION_SOLUTION | Freq: Two times a day (BID) | RESPIRATORY_TRACT | 1 refills | Status: DC
Start: 2022-10-30 — End: 2023-01-02

## 2022-10-30 NOTE — Progress Notes (Signed)
Subjective:  Patient ID: [name], female    DOB: 1940-10-28  Age: 82 y.o. MRN: [id number]  CC: Medical Management of Chronic Issues   HPI [name] presents for  follow-up of hypertension. Patient has no history of headache chest pain or shortness of breath or recent cough. Patient also denies symptoms of TIA such as focal numbness or weakness. Patient denies side effects from medication. States taking it regularly.  Lots of dypnea. Gets short of breath just walking from bed to bathroom. Has COPD. Using neb and inhalers and getting no elief. Onset last month.   History [name] has a past medical history of Arthritis, Atypical chest pain (03/19/2022), GERD (gastroesophageal reflux disease), Headache(784.0), Hepatitis, History of gout, Hypertension, Hypertensive encephalopathy, 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   She has a past surgical history that includes Abdominal hysterectomy; Anterior cervical corpectomy (03/07/2012); Tonsillectomy; Multiple tooth extractions; Cataract extraction w/PHACO (Left, 04/12/2017); and Cataract extraction w/PHACO (Right, 05/03/2017).   Her family history includes Asthma in her daughter; Bipolar disorder in her daughter, daughter, daughter, daughter, and son; Drug abuse in her daughter; Heart disease in her daughter and daughter; Hypertension in her daughter. She was adopted.She reports that she quit smoking about 7 months ago. Her smoking use included cigarettes. She started smoking about 62 years ago. She has a 25.00 pack-year smoking history. She has never used smokeless tobacco. She reports current alcohol use. She reports that she does not use drugs.  Current Outpatient Medications on File Prior to Visit  Medication Sig Dispense Refill   amLODipine (NORVASC) 10 MG tablet Take 1 tablet (10 mg total) by mouth daily. 90 tablet 3   atorvastatin (LIPITOR) 80 MG tablet  Take 1 tablet (80 mg total) by mouth daily. 90 tablet 3   bisoprolol (ZEBETA) 5 MG tablet Take 1 tablet (5 mg total) by mouth daily. 30 tablet 2   clopidogrel (PLAVIX) 75 MG tablet TAKE ONE TABLET ONCE DAILY 90 tablet 0   cyclobenzaprine (FLEXERIL) 5 MG tablet TAKE ONE TABLET THREE TIMES DAILY AS NEEDED FOR MUSCLE SPASMS (Patient taking differently: Take 5 mg by mouth 3 (three) times daily as needed for muscle spasms.) 30 tablet 5   ezetimibe (ZETIA) 10 MG tablet Take 1 tablet (10 mg total) by mouth daily. 90 tablet 3   hydrALAZINE (APRESOLINE) 50 MG tablet Take 1 tablet (50 mg total) by mouth 3 (three) times daily. 270 tablet 3   isosorbide dinitrate (ISORDIL) 20 MG tablet Take 1 tablet (20 mg total) by mouth 3 (three) times daily. 90 tablet 2   vitamin B-12 1000 MCG tablet Take 1 tablet (1,000 mcg total) by mouth daily. 30 tablet 2   No current facility-administered medications on file prior to visit.    ROS Review of Systems  Constitutional: Negative.   HENT:  Negative for congestion.   Eyes:  Negative for visual disturbance.  Respiratory:  Positive for shortness of breath. Negative for cough.   Cardiovascular:  Negative for chest pain and leg swelling.  Gastrointestinal:  Negative for abdominal pain, constipation, diarrhea, nausea and vomiting.  Genitourinary:  Negative for difficulty urinating.  Musculoskeletal:  Positive for arthralgias (all over. Mobic helps) and myalgias (muscle spasms in legs and arms).  Neurological:  Negative for headaches.  Psychiatric/Behavioral:  Negative for sleep disturbance.     Objective:  BP (!) 145/66   Pulse 77   Temp 98 F (36.7 C)   Ht 5' 1.5" (  1.562 m)   Wt 136 lb 6.4 oz (61.9 kg)   SpO2 96%   BMI 25.36 kg/m   BP Readings from Last 3 Encounters:  10/30/22 (!) 145/66  10/15/22 130/68  10/09/22 127/70    Wt Readings from Last 3 Encounters:  10/30/22 136 lb 6.4 oz (61.9 kg)  10/15/22 135 lb (61.2 kg)  10/09/22 133 lb (60.3 kg)      Physical Exam Constitutional:      General: She is not in acute distress.    Appearance: She is well-developed.  HENT:     Head: Atraumatic.  Cardiovascular:     Rate and Rhythm: Normal rate and regular rhythm.  Pulmonary:     Effort: Pulmonary effort is normal.     Breath sounds: Wheezing and rhonchi present. No rales.  Abdominal:     General: Abdomen is flat.  Musculoskeletal:        General: Normal range of motion.  Skin:    General: Skin is warm and dry.  Neurological:     Mental Status: She is alert and oriented to person, place, and time.       Assessment & Plan:   [name] was seen today for medical management of chronic issues.  Diagnoses and all orders for this visit:  B12 deficiency -     Vitamin B12  Essential hypertension with goal blood pressure less than 130/80 -     CBC with Differential/Platelet -     CMP14+EGFR -     TSH  Vitamin D deficiency -     VITAMIN D 25 Hydroxy (Vit-D Deficiency, Fractures)  Acute exacerbation of chronic obstructive pulmonary disease (COPD) (HCC) -     budesonide-formoterol (SYMBICORT) 160-4.5 MCG/ACT inhaler; Inhale 2 puffs into the lungs 2 (two) times daily. -     budesonide (PULMICORT) 0.5 MG/2ML nebulizer solution; Take 2 mLs (0.5 mg total) by nebulization 2 (two) times daily. -     arformoterol (BROVANA) 15 MCG/2ML NEBU; Take 2 mLs (15 mcg total) by nebulization 2 (two) times daily. -     albuterol (VENTOLIN HFA) 108 (90 Base) MCG/ACT inhaler; Inhale 2 puffs into the lungs every 6 (six) hours as needed for wheezing or shortness of breath. -     CBC with Differential/Platelet -     CMP14+EGFR -     Vitamin B12 -     predniSONE (DELTASONE) 20 MG tablet; One twice daily with food for 2 weeks. Then one daily for 2 weeks  Left renal artery stenosis (HCC) -     acetaminophen (TYLENOL) 500 MG tablet; Take 2 tablets (1,000 mg total) by mouth 3 (three) times daily.  Primary osteoarthritis involving multiple  joints  Other orders -     Discontinue: predniSONE (DELTASONE) 10 MG tablet; Take 5 daily for 3 days followed by 4,3,2 and 1 for 3 days each.   Allergies as of 10/30/2022       Reactions   Penicillins Anaphylaxis, Swelling, Rash, Other (See Comments)   Has patient had a PCN reaction causing immediate rash, facial/tongue/throat swelling, SOB or lightheadedness with hypotension: yes Has patient had a PCN reaction causing severe rash involving mucus membranes or skin necrosis: no Has patient had a PCN reaction that required hospitalization: yes Has patient had a PCN reaction occurring within the last 10 years: no If all of the above answers are "NO", then may proceed with Cephalosporin use.        Medication List  Accurate as of October 30, 2022  3:33 PM. If you have any questions, ask your nurse or doctor.          STOP taking these medications    meloxicam 15 MG tablet Commonly known as: MOBIC Stopped by: Mechele Claude, MD       TAKE these medications    acetaminophen 500 MG tablet Commonly known as: TYLENOL Take 2 tablets (1,000 mg total) by mouth 3 (three) times daily. Started by: Mechele Claude, MD   albuterol 108 (90 Base) MCG/ACT inhaler Commonly known as: VENTOLIN HFA Inhale 2 puffs into the lungs every 6 (six) hours as needed for wheezing or shortness of breath.   amLODipine 10 MG tablet Commonly known as: NORVASC Take 1 tablet (10 mg total) by mouth daily.   arformoterol 15 MCG/2ML Nebu Commonly known as: BROVANA Take 2 mLs (15 mcg total) by nebulization 2 (two) times daily.   atorvastatin 80 MG tablet Commonly known as: LIPITOR Take 1 tablet (80 mg total) by mouth daily.   bisoprolol 5 MG tablet Commonly known as: ZEBETA Take 1 tablet (5 mg total) by mouth daily.   budesonide 0.5 MG/2ML nebulizer solution Commonly known as: PULMICORT Take 2 mLs (0.5 mg total) by nebulization 2 (two) times daily.   budesonide-formoterol 160-4.5 MCG/ACT  inhaler Commonly known as: SYMBICORT Inhale 2 puffs into the lungs 2 (two) times daily.   clopidogrel 75 MG tablet Commonly known as: PLAVIX TAKE ONE TABLET ONCE DAILY   cyanocobalamin 1000 MCG tablet Take 1 tablet (1,000 mcg total) by mouth daily.   cyclobenzaprine 5 MG tablet Commonly known as: FLEXERIL TAKE ONE TABLET THREE TIMES DAILY AS NEEDED FOR MUSCLE SPASMS What changed: See the new instructions.   ezetimibe 10 MG tablet Commonly known as: ZETIA Take 1 tablet (10 mg total) by mouth daily.   hydrALAZINE 50 MG tablet Commonly known as: APRESOLINE Take 1 tablet (50 mg total) by mouth 3 (three) times daily.   isosorbide dinitrate 20 MG tablet Commonly known as: ISORDIL Take 1 tablet (20 mg total) by mouth 3 (three) times daily.   predniSONE 20 MG tablet Commonly known as: DELTASONE One twice daily with food for 2 weeks. Then one daily for 2 weeks Started by: Mechele Claude, MD        Meds ordered this encounter  Medications   budesonide-formoterol (SYMBICORT) 160-4.5 MCG/ACT inhaler    Sig: Inhale 2 puffs into the lungs 2 (two) times daily.    Dispense:  1 each    Refill:  5   budesonide (PULMICORT) 0.5 MG/2ML nebulizer solution    Sig: Take 2 mLs (0.5 mg total) by nebulization 2 (two) times daily.    Dispense:  480 mL    Refill:  0   arformoterol (BROVANA) 15 MCG/2ML NEBU    Sig: Take 2 mLs (15 mcg total) by nebulization 2 (two) times daily.    Dispense:  120 mL    Refill:  1   albuterol (VENTOLIN HFA) 108 (90 Base) MCG/ACT inhaler    Sig: Inhale 2 puffs into the lungs every 6 (six) hours as needed for wheezing or shortness of breath.    Dispense:  1 each    Refill:  11   acetaminophen (TYLENOL) 500 MG tablet    Sig: Take 2 tablets (1,000 mg total) by mouth 3 (three) times daily.    Dispense:  180 tablet    Refill:  PRN   DISCONTD: predniSONE (DELTASONE) 10 MG tablet  Sig: Take 5 daily for 3 days followed by 4,3,2 and 1 for 3 days each.    Dispense:   45 tablet    Refill:  0   predniSONE (DELTASONE) 20 MG tablet    Sig: One twice daily with food for 2 weeks. Then one daily for 2 weeks    Dispense:  42 tablet    Refill:  0    (DC previous prednisone order) Thanks! WS      Follow-up: Return in about 1 month (around 11/29/2022), or if symptoms worsen or fail to improve.  Mechele Claude, M.D.

## 2022-10-31 ENCOUNTER — Other Ambulatory Visit: Payer: Self-pay | Admitting: *Deleted

## 2022-10-31 LAB — CMP14+EGFR
ALT: 9 IU/L (ref 0–32)
AST: 21 IU/L (ref 0–40)
Albumin/Globulin Ratio: 1.5 (ref 1.2–2.2)
Alkaline Phosphatase: 60 IU/L (ref 44–121)
BUN/Creatinine Ratio: 18 (ref 12–28)
Bilirubin Total: 0.2 mg/dL (ref 0.0–1.2)
Calcium: 8.8 mg/dL (ref 8.7–10.3)
Chloride: 106 mmol/L (ref 96–106)
Globulin, Total: 2.8 g/dL (ref 1.5–4.5)
Glucose: 103 mg/dL — ABNORMAL HIGH (ref 70–99)
Potassium: 4.4 mmol/L (ref 3.5–5.2)
Sodium: 142 mmol/L (ref 134–144)
eGFR: 38 mL/min/{1.73_m2} — ABNORMAL LOW (ref >59)

## 2022-10-31 LAB — CBC WITH DIFFERENTIAL/PLATELET
Basophils Absolute: 0 10*3/uL (ref 0.0–0.2)
Hematocrit: 43 % (ref 34.0–46.6)
Hemoglobin: 13.6 g/dL (ref 11.1–15.9)
Immature Granulocytes: 0 %
Lymphs: 31 %
MCV: 93 fL (ref 79–97)
Monocytes Absolute: 0.6 10*3/uL (ref 0.1–0.9)
Neutrophils Absolute: 3.1 10*3/uL (ref 1.4–7.0)
RBC: 4.63 x10E6/uL (ref 3.77–5.28)
WBC: 5.6 10*3/uL (ref 3.4–10.8)

## 2022-10-31 LAB — TSH: TSH: 2.15 u[IU]/mL (ref 0.450–4.500)

## 2022-10-31 LAB — VITAMIN B12: Vitamin B-12: 280 pg/mL (ref 232–1245)

## 2022-10-31 MED ORDER — VITAMIN D (ERGOCALCIFEROL) 1.25 MG (50000 UNIT) PO CAPS: 50000.0000 [IU] | ORAL_CAPSULE | ORAL | 3 refills | Status: DC

## 2022-10-31 NOTE — Progress Notes (Signed)
Dear [name], Your Vitamin D is  low. You need a prescription strength supplement I will send that in for you.  Also has a relatively low Vitamin B 12. If she has been taking the daily oral supplement, then she will need to start monthly injections.  Nurse, if at all possible, could you send in a prescription for the patient for vitamin D 50,000 units, 1 p.o. weekly #13 with 3 refills? Many thanks, WS

## 2022-11-05 ENCOUNTER — Encounter: Payer: Self-pay | Admitting: *Deleted

## 2022-11-07 ENCOUNTER — Ambulatory Visit (INDEPENDENT_AMBULATORY_CARE_PROVIDER_SITE_OTHER): Payer: 59

## 2022-11-07 VITALS — Ht 61.0 in | Wt 136.0 lb

## 2022-11-07 DIAGNOSIS — Z780 Asymptomatic menopausal state: Secondary | ICD-10-CM

## 2022-11-07 DIAGNOSIS — Z0000 Encounter for general adult medical examination without abnormal findings: Secondary | ICD-10-CM

## 2022-11-07 NOTE — Progress Notes (Signed)
Subjective:   [name] is a 82 y.o. female who presents for Medicare Annual (Subsequent) preventive examination. I connected with  [name] on 11/07/22 by a audio enabled telemedicine application and verified that I am speaking with the correct person using two identifiers.  Patient Location: Home  Provider Location: Home Office  I discussed the limitations of evaluation and management by telemedicine. The patient expressed understanding and agreed to proceed.  Review of Systems     Cardiac Risk Factors include: advanced age (>37men, >20 women);hypertension;dyslipidemia     Objective:    Today's Vitals   11/07/22 0849  Weight: 136 lb (61.7 kg)  Height: 5\' 1"  (1.549 m)   Body mass index is 25.7 kg/m.     11/07/2022    8:54 AM 09/17/2022   12:28 AM 09/16/2022   12:06 PM 03/25/2022   12:28 PM 03/25/2022    7:42 AM 03/12/2022    4:31 AM 05/15/2021    1:27 PM  Advanced Directives  Does Patient Have a Medical Advance Directive? No No No  No No No  Would patient like information on creating a medical advance directive? No - Patient declined No - Patient declined No - Patient declined No - Patient declined   No - Patient declined    Current Medications (verified) Outpatient Encounter Medications as of 11/07/2022  Medication Sig   acetaminophen (TYLENOL) 500 MG tablet Take 2 tablets (1,000 mg total) by mouth 3 (three) times daily.   albuterol (VENTOLIN HFA) 108 (90 Base) MCG/ACT inhaler Inhale 2 puffs into the lungs every 6 (six) hours as needed for wheezing or shortness of breath.   amLODipine (NORVASC) 10 MG tablet Take 1 tablet (10 mg total) by mouth daily.   arformoterol (BROVANA) 15 MCG/2ML NEBU Take 2 mLs (15 mcg total) by nebulization 2 (two) times daily.   atorvastatin (LIPITOR) 80 MG tablet Take 1 tablet (80 mg total) by mouth daily.   bisoprolol (ZEBETA) 5 MG tablet Take 1 tablet (5 mg total) by mouth daily.   budesonide (PULMICORT) 0.5 MG/2ML nebulizer  solution Take 2 mLs (0.5 mg total) by nebulization 2 (two) times daily.   budesonide-formoterol (SYMBICORT) 160-4.5 MCG/ACT inhaler Inhale 2 puffs into the lungs 2 (two) times daily.   clopidogrel (PLAVIX) 75 MG tablet TAKE ONE TABLET ONCE DAILY   cyclobenzaprine (FLEXERIL) 5 MG tablet TAKE ONE TABLET THREE TIMES DAILY AS NEEDED FOR MUSCLE SPASMS (Patient taking differently: Take 5 mg by mouth 3 (three) times daily as needed for muscle spasms.)   ezetimibe (ZETIA) 10 MG tablet Take 1 tablet (10 mg total) by mouth daily.   hydrALAZINE (APRESOLINE) 50 MG tablet Take 1 tablet (50 mg total) by mouth 3 (three) times daily.   isosorbide dinitrate (ISORDIL) 20 MG tablet Take 1 tablet (20 mg total) by mouth 3 (three) times daily.   predniSONE (DELTASONE) 20 MG tablet One twice daily with food for 2 weeks. Then one daily for 2 weeks   vitamin B-12 1000 MCG tablet Take 1 tablet (1,000 mcg total) by mouth daily.   Vitamin D, Ergocalciferol, (DRISDOL) 1.25 MG (50000 UNIT) CAPS capsule Take 1 capsule (50,000 Units total) by mouth every 7 (seven) days.   No facility-administered encounter medications on file as of 11/07/2022.    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   Stroke (HCC)    SLIGHT RT SIDE WEAKNESS 2001   Unintentional weight loss 03/19/2022   Past Surgical History:  Procedure Laterality Date   ABDOMINAL HYSTERECTOMY     ANTERIOR CERVICAL CORPECTOMY  03/07/2012   Procedure: ANTERIOR CERVICAL CORPECTOMY;  Surgeon: Barnett Abu, MD;  Location: [facility];  Service: Neurosurgery;  Laterality: N/A;  Cervical six-seven, cervical seven-thoracic  one Anterior cervical decompression/diskectomy/fusion, with Cervical seven Corpectomy, reconstruction using Allograft and Alphatec plate   CATARACT EXTRACTION W/PHACO Left 04/12/2017   Procedure: CATARACT EXTRACTION PHACO AND INTRAOCULAR LENS PLACEMENT (IOC);  Surgeon: Fabio Pierce, MD;  Location: [facility];  Service: Ophthalmology;  Laterality: Left;  CDE: 3.82   CATARACT EXTRACTION W/PHACO Right 05/03/2017   Procedure: CATARACT EXTRACTION PHACO AND INTRAOCULAR LENS PLACEMENT RIGHT EYE;  Surgeon: Fabio Pierce, MD;  Location: [facility];  Service: Ophthalmology;  Laterality: Right;  CDE: 3.89   MULTIPLE TOOTH EXTRACTIONS     TONSILLECTOMY     Family History  Adopted: Yes  Problem Relation Age of Onset   Bipolar disorder Daughter    Heart disease Daughter    Asthma Daughter    Bipolar disorder Son    Bipolar disorder Daughter    Bipolar disorder Daughter    Bipolar disorder Daughter    Hypertension Daughter    Heart disease Daughter    Drug abuse Daughter        OD   Social History   Socioeconomic History   Marital status: Widowed    Spouse name: Not on file   Number of children: 5   Years of education: Not on file   Highest education level: Not on file  Occupational History   Occupation: retired    Comment: CNA  Tobacco Use   Smoking status: Former    Packs/day: 0.50    Years: 50.00    Additional pack years: 0.00    Total pack years: 25.00    Types: Cigarettes    Start date: 1962    Quit date: 03/24/2022    Years since quitting: 0.6   Smokeless tobacco: Never  Vaping Use   Vaping Use: Never used  Substance and Sexual Activity   Alcohol use: Yes    Comment: occasional   Drug use: No   Sexual activity: Not Currently    Birth control/protection: Surgical  Other Topics Concern   Not on file  Social History Narrative   5 children living, 1 deceased.    Children live out of state.   Social Determinants of Health   Financial Resource Strain: Low Risk  (11/07/2022)    Overall Financial Resource Strain (CARDIA)    Difficulty of Paying Living Expenses: Not hard at all  Food Insecurity: No Food Insecurity (11/07/2022)   Hunger Vital Sign    Worried About Running Out of Food in the Last Year: Never true    Ran Out of Food in the Last Year: Never true  Transportation Needs: No Transportation Needs (11/07/2022)   PRAPARE - Administrator, Civil Service (Medical): No    Lack of Transportation (Non-Medical): No  Physical Activity: Inactive (11/07/2022)   Exercise Vital Sign    Days of Exercise per Week: 0 days    Minutes of Exercise per Session: 0 min  Stress: No Stress Concern Present (11/07/2022)   [employer] of Occupational Health - Occupational Stress Questionnaire    Feeling of Stress : Not at all  Social Connections: Moderately Isolated (11/07/2022)   Social Connection and Isolation Panel [NHANES]    Frequency of Communication with Friends and Family: More than three times a week    Frequency of Social Gatherings with Friends and Family: More than three times a week    Attends Religious Services: More than 4 times per year    Active Member of [employer] or Organizations: No    Attends Banker Meetings: Never    Marital Status: Widowed    Tobacco Counseling Counseling given: Not Answered   Clinical Intake:  Pre-visit preparation completed: Yes  Pain : No/denies pain     Nutritional Risks: None Diabetes: No  How often do you need to have someone help you when you read instructions, pamphlets, or other written materials from your doctor or pharmacy?: 1 - Never  Diabetic?no   Interpreter Needed?: No  Information entered by :: Renie Ora, LPN   Activities of Daily Living    11/07/2022    8:54 AM 09/17/2022   12:33 AM  In your present state of health, do you have any difficulty performing the following activities:  Hearing? 0   Vision? 0   Difficulty concentrating or making decisions? 0   Walking or climbing  stairs? 0   Dressing or bathing? 0   Doing errands, shopping? 0 0  Preparing Food and eating ? N   Using the Toilet? N   In the past six months, have you accidently leaked urine? N   Do you have problems with loss of bowel control? N   Managing your Medications? N   Managing your Finances? N   Housekeeping or managing your Housekeeping? N     Patient Care Team: Mechele Claude, MD as PCP - General (Family Medicine) Chilton Si, MD as PCP - Cardiology (Cardiology)  Indicate any recent Medical Services you may have received from other than [facility] providers in the past year (date may be approximate).     Assessment:   This is a routine wellness examination for Shira.  Hearing/Vision screen Vision Screening - Comments:: Wears rx glasses - up to date with routine eye exams with  Dr.Johnson  Dietary issues and exercise activities discussed: Current Exercise Habits: The patient does not participate in regular exercise at present   Goals Addressed             This Visit's Progress    DIET - EAT MORE FRUITS AND VEGETABLES   On track      Depression Screen    11/07/2022    8:52 AM 10/30/2022    3:06 PM 10/03/2022    7:58 AM 06/20/2022    9:41 AM 05/16/2022   10:35 AM 12/27/2021    2:56 PM 08/03/2021   10:18 AM  PHQ 2/9 Scores  PHQ - 2 Score 0 1 0 0 0 1 3  PHQ- 9 Score 0 6 0  0 5 13    Fall Risk    11/07/2022    8:51 AM 10/30/2022    3:06 PM 10/03/2022    7:58 AM 06/20/2022    9:41 AM 05/16/2022   10:35 AM  Fall Risk   Falls in the past year? 1 1 0 0 0  Number falls in past yr: 1 0   0  Injury with Fall? 1 0  0  Risk for fall due to : History of fall(s);Impaired balance/gait;Orthopedic patient History of fall(s)   No Fall Risks  Follow up Education provided;Falls prevention discussed Falls evaluation completed       FALL RISK PREVENTION PERTAINING TO THE HOME:  Any stairs in or around the home? No  If so, are there any without handrails? No  Home free of loose  throw rugs in walkways, pet beds, electrical cords, etc? Yes  Adequate lighting in your home to reduce risk of falls? Yes   ASSISTIVE DEVICES UTILIZED TO PREVENT FALLS:  Life alert? No  Use of a cane, walker or w/c? Yes  Grab bars in the bathroom? No  Shower chair or bench in shower? No  Elevated toilet seat or a handicapped toilet? No        11/07/2022    8:54 AM 01/27/2021    2:56 PM 01/27/2020    8:51 AM 01/26/2019    8:45 AM  6CIT Screen  What Year? 0 points 0 points 0 points 0 points  What month? 0 points 0 points 0 points 0 points  What time? 0 points 0 points 0 points 0 points  Count back from 20 0 points 0 points 0 points 0 points  Months in reverse 0 points 4 points 0 points 2 points  Repeat phrase 0 points 0 points 0 points 0 points  Total Score 0 points 4 points 0 points 2 points    Immunizations Immunization History  Administered Date(s) Administered   COVID-19, mRNA, vaccine(Comirnaty)12 years and older 03/06/2022   Fluad Quad(high Dose 65+) 02/26/2016, 02/25/2017, 02/26/2018, 02/24/2020, 02/21/2021   Influenza Split 02/13/2012   Influenza, High Dose Seasonal PF 02/19/2019   Influenza-Unspecified 03/28/2013   Moderna Covid-19 Vaccine Bivalent Booster 66yrs & up 07/18/2021   Moderna Sars-Covid-2 Vaccination 07/23/2019, 08/21/2019, 03/23/2020   Pneumococcal Conjugate-13 11/24/2014   Pneumococcal Polysaccharide-23 07/09/2016   Pneumococcal-Unspecified 07/09/2016   Tdap 11/24/2014   Zoster Recombinat (Shingrix) 12/27/2021, 06/20/2022    TDAP status: Up to date  Flu Vaccine status: Up to date  Pneumococcal vaccine status: Up to date  Covid-19 vaccine status: Completed vaccines  Qualifies for Shingles Vaccine? Yes   Zostavax completed Yes   Shingrix Completed?: Yes  Screening Tests Health Maintenance  Topic Date Due   DEXA SCAN  07/27/2021   INFLUENZA VACCINE  12/27/2022   Lung Cancer Screening  09/16/2023   Medicare Annual Wellness (AWV)  11/07/2023    DTaP/Tdap/Td (2 - Td or Tdap) 11/23/2024   Pneumonia Vaccine 35+ Years old  Completed   COVID-19 Vaccine  Completed   Zoster Vaccines- Shingrix  Completed   HPV VACCINES  Aged Out   Hepatitis C Screening  Discontinued    Health Maintenance  Health Maintenance Due  Topic Date Due   DEXA SCAN  07/27/2021    Colorectal cancer screening: No longer required.   Mammogram status: No longer required due to age.  Bone Density status: Ordered 11/07/2022. Pt provided with contact info and advised to call to schedule appt.  Lung Cancer Screening: (Low Dose CT Chest recommended if Age 33-80 years, 30 pack-year currently smoking OR have quit w/in 15years.) does qualify.   Lung Cancer Screening Referral: 09/16/2022  Additional Screening:  Hepatitis C Screening: does not qualify;   Vision Screening: Recommended annual ophthalmology exams for early detection of glaucoma and other disorders of the eye. Is the patient up to date with their annual eye exam?  Yes  Who is the provider  or what is the name of the office in which the patient attends annual eye exams? Dr.johnson  If pt is not established with a provider, would they like to be referred to a provider to establish care? No .   Dental Screening: Recommended annual dental exams for proper oral hygiene  Community Resource Referral / Chronic Care Management: CRR required this visit?  No   CCM required this visit?  No      Plan:     I have personally reviewed and noted the following in the patient's chart:   Medical and social history Use of alcohol, tobacco or illicit drugs  Current medications and supplements including opioid prescriptions. Patient is not currently taking opioid prescriptions. Functional ability and status Nutritional status Physical activity Advanced directives List of other physicians Hospitalizations, surgeries, and ER visits in previous 12 months Vitals Screenings to include cognitive, depression, and  falls Referrals and appointments  In addition, I have reviewed and discussed with patient certain preventive protocols, quality metrics, and best practice recommendations. A written personalized care plan for preventive services as well as general preventive health recommendations were provided to patient.     Lorrene Reid, LPN   1/61/0960   Nurse Notes: none

## 2022-11-07 NOTE — Patient Instructions (Signed)
[name] , Thank you for taking time to come for your Medicare Wellness Visit. I appreciate your ongoing commitment to your health goals. Please review the following plan we discussed and let me know if I can assist you in the future.   These are the goals we discussed:  Goals      DIET - EAT MORE FRUITS AND VEGETABLES     Exercise 150 min/wk Moderate Activity     Pt states she walks 3 miles per day.     Prevent falls     Stay active          This is a list of the screening recommended for you and due dates:  Health Maintenance  Topic Date Due   DEXA scan (bone density measurement)  07/27/2021   Flu Shot  12/27/2022   Screening for Lung Cancer  09/16/2023   Medicare Annual Wellness Visit  11/07/2023   DTaP/Tdap/Td vaccine (2 - Td or Tdap) 11/23/2024   Pneumonia Vaccine  Completed   COVID-19 Vaccine  Completed   Zoster (Shingles) Vaccine  Completed   HPV Vaccine  Aged Out   Hepatitis C Screening  Discontinued    Advanced directives: Advance directive discussed with you today. I have provided a copy for you to complete at home and have notarized. Once this is complete please bring a copy in to our office so we can scan it into your chart.   Conditions/risks identified: Aim for 30 minutes of exercise or brisk walking, 6-8 glasses of water, and 5 servings of fruits and vegetables each day.   Next appointment: Follow up in one year for your annual wellness visit    Preventive Care 65 Years and Older, Female Preventive care refers to lifestyle choices and visits with your health care provider that can promote health and wellness. What does preventive care include? A yearly physical exam. This is also called an annual well check. Dental exams once or twice a year. Routine eye exams. Ask your health care provider how often you should have your eyes checked. Personal lifestyle choices, including: Daily care of your teeth and gums. Regular physical activity. Eating a healthy  diet. Avoiding tobacco and drug use. Limiting alcohol use. Practicing safe sex. Taking low-dose aspirin every day. Taking vitamin and mineral supplements as recommended by your health care provider. What happens during an annual well check? The services and screenings done by your health care provider during your annual well check will depend on your age, overall health, lifestyle risk factors, and family history of disease. Counseling  Your health care provider may ask you questions about your: Alcohol use. Tobacco use. Drug use. Emotional well-being. Home and relationship well-being. Sexual activity. Eating habits. History of falls. Memory and ability to understand (cognition). Work and work Astronomer. Reproductive health. Screening  You may have the following tests or measurements: Height, weight, and BMI. Blood pressure. Lipid and cholesterol levels. These may be checked every 5 years, or more frequently if you are over 77 years old. Skin check. Lung cancer screening. You may have this screening every year starting at age 34 if you have a 30-pack-year history of smoking and currently smoke or have quit within the past 15 years. Fecal occult blood test (FOBT) of the stool. You may have this test every year starting at age 19. Flexible sigmoidoscopy or colonoscopy. You may have a sigmoidoscopy every 5 years or a colonoscopy every 10 years starting at age 30. Hepatitis C blood test. Hepatitis  B blood test. Sexually transmitted disease (STD) testing. Diabetes screening. This is done by checking your blood sugar (glucose) after you have not eaten for a while (fasting). You may have this done every 1-3 years. Bone density scan. This is done to screen for osteoporosis. You may have this done starting at age 54. Mammogram. This may be done every 1-2 years. Talk to your health care provider about how often you should have regular mammograms. Talk with your health care provider about  your test results, treatment options, and if necessary, the need for more tests. Vaccines  Your health care provider may recommend certain vaccines, such as: Influenza vaccine. This is recommended every year. Tetanus, diphtheria, and acellular pertussis (Tdap, Td) vaccine. You may need a Td booster every 10 years. Zoster vaccine. You may need this after age 4. Pneumococcal 13-valent conjugate (PCV13) vaccine. One dose is recommended after age 50. Pneumococcal polysaccharide (PPSV23) vaccine. One dose is recommended after age 62. Talk to your health care provider about which screenings and vaccines you need and how often you need them. This information is not intended to replace advice given to you by your health care provider. Make sure you discuss any questions you have with your health care provider. Document Released: 06/10/2015 Document Revised: 02/01/2016 Document Reviewed: 03/15/2015 Elsevier Interactive Patient Education  2017 [place] Prevention in the Home Falls can cause injuries. They can happen to people of all ages. There are many things you can do to make your home safe and to help prevent falls. What can I do on the outside of my home? Regularly fix the edges of walkways and driveways and fix any cracks. Remove anything that might make you trip as you walk through a door, such as a raised step or threshold. Trim any bushes or trees on the path to your home. Use bright outdoor lighting. Clear any walking paths of anything that might make someone trip, such as rocks or tools. Regularly check to see if handrails are loose or broken. Make sure that both sides of any steps have handrails. Any raised decks and porches should have guardrails on the edges. Have any leaves, snow, or ice cleared regularly. Use sand or salt on walking paths during winter. Clean up any spills in your garage right away. This includes oil or grease spills. What can I do in the bathroom? Use  night lights. Install grab bars by the toilet and in the tub and shower. Do not use towel bars as grab bars. Use non-skid mats or decals in the tub or shower. If you need to sit down in the shower, use a plastic, non-slip stool. Keep the floor dry. Clean up any water that spills on the floor as soon as it happens. Remove soap buildup in the tub or shower regularly. Attach bath mats securely with double-sided non-slip rug tape. Do not have throw rugs and other things on the floor that can make you trip. What can I do in the bedroom? Use night lights. Make sure that you have a light by your bed that is easy to reach. Do not use any sheets or blankets that are too big for your bed. They should not hang down onto the floor. Have a firm chair that has side arms. You can use this for support while you get dressed. Do not have throw rugs and other things on the floor that can make you trip. What can I do in the kitchen? Clean up any  spills right away. Avoid walking on wet floors. Keep items that you use a lot in easy-to-reach places. If you need to reach something above you, use a strong step stool that has a grab bar. Keep electrical cords out of the way. Do not use floor polish or wax that makes floors slippery. If you must use wax, use non-skid floor wax. Do not have throw rugs and other things on the floor that can make you trip. What can I do with my stairs? Do not leave any items on the stairs. Make sure that there are handrails on both sides of the stairs and use them. Fix handrails that are broken or loose. Make sure that handrails are as long as the stairways. Check any carpeting to make sure that it is firmly attached to the stairs. Fix any carpet that is loose or worn. Avoid having throw rugs at the top or bottom of the stairs. If you do have throw rugs, attach them to the floor with carpet tape. Make sure that you have a light switch at the top of the stairs and the bottom of the  stairs. If you do not have them, ask someone to add them for you. What else can I do to help prevent falls? Wear shoes that: Do not have high heels. Have rubber bottoms. Are comfortable and fit you well. Are closed at the toe. Do not wear sandals. If you use a stepladder: Make sure that it is fully opened. Do not climb a closed stepladder. Make sure that both sides of the stepladder are locked into place. Ask someone to hold it for you, if possible. Clearly mark and make sure that you can see: Any grab bars or handrails. First and last steps. Where the edge of each step is. Use tools that help you move around (mobility aids) if they are needed. These include: Canes. Walkers. Scooters. Crutches. Turn on the lights when you go into a dark area. Replace any light bulbs as soon as they burn out. Set up your furniture so you have a clear path. Avoid moving your furniture around. If any of your floors are uneven, fix them. If there are any pets around you, be aware of where they are. Review your medicines with your doctor. Some medicines can make you feel dizzy. This can increase your chance of falling. Ask your doctor what other things that you can do to help prevent falls. This information is not intended to replace advice given to you by your health care provider. Make sure you discuss any questions you have with your health care provider. Document Released: 03/10/2009 Document Revised: 10/20/2015 Document Reviewed: 06/18/2014 Elsevier Interactive Patient Education  2017 [employer].

## 2022-11-09 ENCOUNTER — Encounter: Payer: Self-pay | Admitting: Family Medicine

## 2022-11-25 DIAGNOSIS — J441 Chronic obstructive pulmonary disease with (acute) exacerbation: Secondary | ICD-10-CM | POA: Diagnosis not present

## 2022-12-07 ENCOUNTER — Emergency Department (HOSPITAL_COMMUNITY): Payer: 59

## 2022-12-07 ENCOUNTER — Emergency Department (HOSPITAL_COMMUNITY)
Admission: EM | Admit: 2022-12-07 | Discharge: 2022-12-07 | Disposition: A | Discharge status: Home / Self Care | Payer: 59 | Attending: Emergency Medicine | Admitting: Emergency Medicine

## 2022-12-07 ENCOUNTER — Other Ambulatory Visit: Payer: Self-pay

## 2022-12-07 DIAGNOSIS — J449 Chronic obstructive pulmonary disease, unspecified: Secondary | ICD-10-CM | POA: Diagnosis not present

## 2022-12-07 DIAGNOSIS — R0602 Shortness of breath: Secondary | ICD-10-CM | POA: Diagnosis not present

## 2022-12-07 DIAGNOSIS — J810 Acute pulmonary edema: Secondary | ICD-10-CM | POA: Insufficient documentation

## 2022-12-07 DIAGNOSIS — R404 Transient alteration of awareness: Secondary | ICD-10-CM | POA: Diagnosis not present

## 2022-12-07 DIAGNOSIS — R079 Chest pain, unspecified: Secondary | ICD-10-CM | POA: Diagnosis not present

## 2022-12-07 DIAGNOSIS — Z743 Need for continuous supervision: Secondary | ICD-10-CM | POA: Diagnosis not present

## 2022-12-07 DIAGNOSIS — R519 Headache, unspecified: Secondary | ICD-10-CM | POA: Diagnosis not present

## 2022-12-07 DIAGNOSIS — Z981 Arthrodesis status: Secondary | ICD-10-CM | POA: Diagnosis not present

## 2022-12-07 DIAGNOSIS — R0789 Other chest pain: Secondary | ICD-10-CM | POA: Diagnosis not present

## 2022-12-07 DIAGNOSIS — R6889 Other general symptoms and signs: Secondary | ICD-10-CM | POA: Diagnosis not present

## 2022-12-07 DIAGNOSIS — G238 Other specified degenerative diseases of basal ganglia: Secondary | ICD-10-CM | POA: Diagnosis not present

## 2022-12-07 DIAGNOSIS — I10 Essential (primary) hypertension: Secondary | ICD-10-CM | POA: Diagnosis not present

## 2022-12-07 DIAGNOSIS — G4489 Other headache syndrome: Secondary | ICD-10-CM | POA: Diagnosis not present

## 2022-12-07 DIAGNOSIS — I6381 Other cerebral infarction due to occlusion or stenosis of small artery: Secondary | ICD-10-CM | POA: Diagnosis not present

## 2022-12-07 DIAGNOSIS — I159 Secondary hypertension, unspecified: Secondary | ICD-10-CM | POA: Insufficient documentation

## 2022-12-07 DIAGNOSIS — Z79899 Other long term (current) drug therapy: Secondary | ICD-10-CM | POA: Insufficient documentation

## 2022-12-07 LAB — BASIC METABOLIC PANEL
Anion gap: 9 (ref 5–15)
BUN: 19 mg/dL (ref 8–23)
CO2: 27 mmol/L (ref 22–32)
Calcium: 9.1 mg/dL (ref 8.9–10.3)
Chloride: 104 mmol/L (ref 98–111)
Creatinine, Ser: 1.16 mg/dL — ABNORMAL HIGH (ref 0.44–1.00)
GFR, Estimated: 48 mL/min — ABNORMAL LOW (ref >60)
Glucose, Bld: 118 mg/dL — ABNORMAL HIGH (ref 70–99)
Potassium: 4.4 mmol/L (ref 3.5–5.1)
Sodium: 140 mmol/L (ref 135–145)

## 2022-12-07 LAB — CBC
HCT: 49 % — ABNORMAL HIGH (ref 36.0–46.0)
Hemoglobin: 15.5 g/dL — ABNORMAL HIGH (ref 12.0–15.0)
MCH: 30.3 pg (ref 26.0–34.0)
MCHC: 31.6 g/dL (ref 30.0–36.0)
MCV: 95.7 fL (ref 80.0–100.0)
Platelets: 166 10*3/uL (ref 150–400)
RBC: 5.12 MIL/uL — ABNORMAL HIGH (ref 3.87–5.11)
RDW: 14.2 % (ref 11.5–15.5)
WBC: 7.6 10*3/uL (ref 4.0–10.5)
nRBC: 0 % (ref 0.0–0.2)

## 2022-12-07 LAB — BRAIN NATRIURETIC PEPTIDE: B Natriuretic Peptide: 134 pg/mL — ABNORMAL HIGH (ref 0.0–100.0)

## 2022-12-07 LAB — TROPONIN I (HIGH SENSITIVITY)
Troponin I (High Sensitivity): 7 ng/L (ref <18)
Troponin I (High Sensitivity): 7 ng/L (ref <18)

## 2022-12-07 MED ORDER — FUROSEMIDE 10 MG/ML IJ SOLN
20.0000 mg | INTRAMUSCULAR | Status: AC
  Administered 2022-12-07: 20 mg via INTRAVENOUS
  Filled 2022-12-07: qty 2

## 2022-12-07 NOTE — Discharge Instructions (Signed)
You were seen for your high blood pressure and headache in the emergency department.  Your CT scan did show signs of a old stroke.  At home, please continue to take your blood pressure medication.    Check your MyChart online for the results of any tests that had not resulted by the time you left the emergency department.   Follow-up with your primary doctor in 2-3 days regarding your visit.    Return immediately to the emergency department if you experience any of the following: Facial droop, slurred speech, numbness or weakness of your arms or legs, or any other concerning symptoms.    Thank you for visiting our Emergency Department. It was a pleasure taking care of you today.

## 2022-12-07 NOTE — ED Provider Notes (Signed)
[place] EMERGENCY DEPARTMENT AT [facility] Provider Note   CSN: [id number] Arrival date & time: 12/07/22  1247     History  Chief Complaint  Patient presents with   Chest Pain    [name] is a 82 y.o. female.  82 year old female with history of TIA/stroke, COPD, hypertensive encephalopathy, and hypertensive emergency who presents to the emergency department with elevated blood pressure, headache, chest pain, and shortness of breath.  Patient reports that she woke up this morning and at 10 AM started to have a headache.  Says that it built up over the course of a minute and was severe.  Describes it as frontal and throbbing.  Says that she also had a room spinning sensation with a headache.  Says that she also started developing chest pain and shortness of breath.  Describes it as a chest pressure.  Says that she took her blood pressure and it was over 200 systolic so she came to the emergency department for evaluation.  No diaphoresis or vomiting.  Says that her symptoms have improved.  No history of stents.  Does have a history of hypertensive emergency and was admitted for this in April of this year.  Says that she has been compliant with her antihypertensives at home.  She is unsure exactly which medication she is supposed to take but based on their notes she is taking the following:  Amlod 10 every day Bisoprolol 5mg  every day  Hydralazine 50 tid Isosorbide 20 mg tid      Home Medications Prior to Admission medications   Medication Sig Start Date End Date Taking? Authorizing Provider  acetaminophen (TYLENOL) 500 MG tablet Take 2 tablets (1,000 mg total) by mouth 3 (three) times daily. 10/30/22   Mechele Claude, MD  albuterol (VENTOLIN HFA) 108 (90 Base) MCG/ACT inhaler Inhale 2 puffs into the lungs every 6 (six) hours as needed for wheezing or shortness of breath. 10/30/22   Mechele Claude, MD  amLODipine (NORVASC) 10 MG tablet Take 1 tablet (10 mg total) by  mouth daily. 09/20/22   Johnson, Clanford L, MD  arformoterol (BROVANA) 15 MCG/2ML NEBU Take 2 mLs (15 mcg total) by nebulization 2 (two) times daily. 10/30/22 12/29/22  Mechele Claude, MD  atorvastatin (LIPITOR) 80 MG tablet Take 1 tablet (80 mg total) by mouth daily. 04/18/22   Alver Sorrow, NP  bisoprolol (ZEBETA) 5 MG tablet Take 1 tablet (5 mg total) by mouth daily. 09/21/22   Johnson, Clanford L, MD  budesonide (PULMICORT) 0.5 MG/2ML nebulizer solution Take 2 mLs (0.5 mg total) by nebulization 2 (two) times daily. 10/30/22 02/27/23  Mechele Claude, MD  budesonide-formoterol (SYMBICORT) 160-4.5 MCG/ACT inhaler Inhale 2 puffs into the lungs 2 (two) times daily. 10/30/22   Mechele Claude, MD  clopidogrel (PLAVIX) 75 MG tablet TAKE ONE TABLET ONCE DAILY 09/26/22   Mechele Claude, MD  cyclobenzaprine (FLEXERIL) 5 MG tablet TAKE ONE TABLET THREE TIMES DAILY AS NEEDED FOR MUSCLE SPASMS Patient taking differently: Take 5 mg by mouth 3 (three) times daily as needed for muscle spasms. 07/18/22   Mechele Claude, MD  ezetimibe (ZETIA) 10 MG tablet Take 1 tablet (10 mg total) by mouth daily. 10/16/22 01/14/23  Alver Sorrow, NP  hydrALAZINE (APRESOLINE) 50 MG tablet Take 1 tablet (50 mg total) by mouth 3 (three) times daily. 09/20/22   Johnson, Clanford L, MD  isosorbide dinitrate (ISORDIL) 20 MG tablet Take 1 tablet (20 mg total) by mouth 3 (three) times daily.  09/20/22   Johnson, Clanford L, MD  predniSONE (DELTASONE) 20 MG tablet One twice daily with food for 2 weeks. Then one daily for 2 weeks 10/30/22   Mechele Claude, MD  vitamin B-12 1000 MCG tablet Take 1 tablet (1,000 mcg total) by mouth daily. 09/21/22   Johnson, Clanford L, MD  Vitamin D, Ergocalciferol, (DRISDOL) 1.25 MG (50000 UNIT) CAPS capsule Take 1 capsule (50,000 Units total) by mouth every 7 (seven) days. 10/31/22   Mechele Claude, MD      Allergies    Penicillins    Review of Systems   Review of Systems  Physical Exam Updated Vital Signs BP  (!) 169/90   Pulse 86   Temp 98.1 F (36.7 C) (Oral)   Resp (!) 26   Ht 5' 1.75" (1.568 m)   Wt 59 kg   SpO2 97%   BMI 23.97 kg/m  Physical Exam Vitals and nursing note reviewed.  Constitutional:      General: She is not in acute distress.    Appearance: She is well-developed.  HENT:     Head: Normocephalic and atraumatic.     Right Ear: External ear normal.     Left Ear: External ear normal.     Nose: Nose normal.  Eyes:     Extraocular Movements: Extraocular movements intact.     Conjunctiva/sclera: Conjunctivae normal.     Pupils: Pupils are equal, round, and reactive to light.  Cardiovascular:     Rate and Rhythm: Normal rate and regular rhythm.     Heart sounds: No murmur heard. Pulmonary:     Effort: Pulmonary effort is normal. No respiratory distress.     Breath sounds: Normal breath sounds.  Musculoskeletal:     Cervical back: Normal range of motion and neck supple.     Right lower leg: No edema.     Left lower leg: No edema.  Skin:    General: Skin is warm and dry.  Neurological:     Mental Status: She is alert and oriented to person, place, and time. Mental status is at baseline.     Comments: MENTAL STATUS: AAOx3 CRANIAL NERVES: II: Pupils equal and reactive 4 mm BL, no RAPD, no VF deficits III, IV, VI: EOM intact, no gaze preference or deviation, no nystagmus. V: normal sensation to light touch in V1, V2, and V3 segments bilaterally VII: no facial weakness or asymmetry, no nasolabial fold flattening VIII: normal hearing to speech and finger friction IX, X: normal palatal elevation, no uvular deviation XI: 5/5 head turn and 5/5 shoulder shrug bilaterally XII: midline tongue protrusion MOTOR: 5/5 strength in R shoulder flexion, elbow flexion and extension, and grip strength. 5/5 strength in L shoulder flexion, elbow flexion and extension, and grip strength.  5/5 strength in R hip and knee flexion, knee extension, ankle plantar and dorsiflexion. 5/5 strength  in L hip and knee flexion, knee extension, ankle plantar and dorsiflexion. SENSORY: Normal sensation to light touch in all extremities COORD: Normal finger to nose and heel to shin, no tremor, no dysmetria STATION: normal stance, no truncal ataxia GAIT: Normal, able to walk without assistance   Psychiatric:        Mood and Affect: Mood normal.     ED Results / Procedures / Treatments   Labs (all labs ordered are listed, but only abnormal results are displayed) Labs Reviewed  BASIC METABOLIC PANEL - Abnormal; Notable for the following components:      Result Value  Glucose, Bld 118 (*)    Creatinine, Ser 1.16 (*)    GFR, Estimated 48 (*)    All other components within normal limits  CBC - Abnormal; Notable for the following components:   RBC 5.12 (*)    Hemoglobin 15.5 (*)    HCT 49.0 (*)    All other components within normal limits  BRAIN NATRIURETIC PEPTIDE - Abnormal; Notable for the following components:   B Natriuretic Peptide 134.0 (*)    All other components within normal limits  TROPONIN I (HIGH SENSITIVITY)  TROPONIN I (HIGH SENSITIVITY)    EKG EKG Interpretation Date/Time:  Friday December 07 2022 13:07:12 EDT Ventricular Rate:  75 PR Interval:  140 QRS Duration:  91 QT Interval:  432 QTC Calculation: 483 R Axis:   -29  Text Interpretation: Sinus rhythm Supraventricular bigeminy Borderline left axis deviation Probable anteroseptal infarct, old Confirmed by Vonita Moss [phone]) on 12/07/2022 2:24:46 PM  Radiology CT Head Wo Contrast  Result Date: 12/07/2022 CLINICAL DATA:  headache EXAM: CT HEAD WITHOUT CONTRAST TECHNIQUE: Contiguous axial images were obtained from the base of the skull through the vertex without intravenous contrast. RADIATION DOSE REDUCTION: This exam was performed according to the departmental dose-optimization program which includes automated exposure control, adjustment of the mA and/or kV according to patient size and/or use of iterative  reconstruction technique. COMPARISON:  MR Head 09/17/22 FINDINGS: Brain: Compared to prior exam there is a new, but age indeterminate infarct in the left basal ganglia (series 4, image 33). No evidence of hemorrhage, hydrocephalus, extra-axial collection or mass lesion/mass effect. Sequela of moderate chronic microvascular ischemic change. 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 Mineralization of the basal ganglia bilaterally. Vascular: No hyperdense vessel or unexpected calcification. Skull: Normal. Negative for fracture or focal lesion. Sinuses/Orbits: No middle ear or mastoid effusion. Paranasal sinuses are clear. Bilateral lens replacement. Orbits are otherwise unremarkable. Other: None. IMPRESSION: Compared to prior exam there is a new, but age indeterminate infarct in the left basal ganglia. If there is clinical concern for an acute infarct, consider further evaluation with MRI. Electronically Signed   By: Lorenza Cambridge M.D.   On: 12/07/2022 15:23   DG Chest 2 View  Result Date: 12/07/2022 CLINICAL DATA:  chest pain EXAM: CHEST - 2 VIEW COMPARISON:  CXR 09/16/22 FINDINGS: No pleural effusion. No pneumothorax. No focal airspace opacity. Normal cardiac and mediastinal contours. No radiographically apparent displaced rib fractures. Prominent bilateral interstitial opacities could represent pulmonary venous congestion or atypical infection. Partially visualized cervical spinal fusion hardware in place. Vertebral body heights are maintained. IMPRESSION: Prominent bilateral interstitial opacities could represent pulmonary venous congestion or atypical infection. Electronically Signed   By: Lorenza Cambridge M.D.   On: 12/07/2022 13:47    Procedures Procedures    Medications Ordered in ED Medications  furosemide (LASIX) injection 20 mg (20 mg Intravenous Given 12/07/22 1525)    ED Course/ Medical Decision Making/ A&P Clinical Course as of 12/07/22 1624  Fri Dec 07, 2022  1500 Creatinine(!): 1.16 At  baseline [RP]  1500 DG Chest 2 View IMPRESSION: Prominent bilateral interstitial opacities could represent pulmonary venous congestion or atypical infection.   [RP]  1500 With the patient's symptoms suspect vascular congestion on her chest x-ray rather than fever since she is not having any infection symptoms and her white blood cell count is WNL and she is afebrile. [RP]  1557 CT Head Wo Contrast IMPRESSION: Compared to prior exam there is a new, but age indeterminate  infarct in the left basal ganglia. If there is clinical concern for an acute infarct, consider further evaluation with MRI. [RP]    Clinical Course User Index [RP] Rondel Baton, MD                             Medical Decision Making Amount and/or Complexity of Data Reviewed Labs: ordered. Decision-making details documented in ED Course. Radiology: ordered. Decision-making details documented in ED Course.  Risk Prescription drug management.   [name] is a 82 y.o. female with comorbidities that complicate the patient evaluation including TIA/stroke, COPD, hypertensive encephalopathy, and hypertensive emergency who presents to the emergency department with elevated blood pressure, headache, chest pain, and shortness of breath.   Initial Ddx:  Hypertensive emergency, pulmonary edema, MI, subarachnoid hemorrhage, stroke  MDM/Course:  Initially concern about hypertensive emergency given the patient's blood pressure.  Did recheck it though without any intervention and it improved significantly to the 130 systolic.  She has a nonfocal neuroexam at this time and lungs are clear to auscultation bilaterally.  EKG and troponins without any concerning findings.  Her BNP was mildly elevated and chest x-ray showed some pulmonary vascular congestion so she was given a dose of Lasix.  She did have a CT of the head which shows an age-indeterminate basal ganglia infarct.  Did discuss this with the patient she does have a  known history of stroke and is already on Plavix and multiple other medications for this.  Since she has no focal neurologic deficits today feel this is likely an old stroke.  Upon re-evaluation her headache and other symptoms had lately resolved.  Will have her follow-up with her primary doctor in several days regarding her symptoms.  Did discuss her elevated blood pressure and she is already on multiple medications for this and she reports that recently her blood pressures have been normal aside from today so we will hold off on making any medication changes.  I did give the patient a blood pressure record sheet and instructed her to take her blood pressure every day for the next week.  This patient presents to the ED for concern of complaints listed in HPI, this involves an extensive number of treatment options, and is a complaint that carries with it a high risk of complications and morbidity. Disposition including potential need for admission considered.   Dispo: DC Home. Return precautions discussed including, but not limited to, those listed in the AVS. Allowed pt time to ask questions which were answered fully prior to dc.  Records reviewed Outpatient Clinic Notes The following labs were independently interpreted: Chemistry and show no acute abnormality I independently reviewed the following imaging with scope of interpretation limited to determining acute life threatening conditions related to emergency care: Chest x-ray and agree with the radiologist interpretation with the following exceptions: none I personally reviewed and interpreted cardiac monitoring: normal sinus rhythm  I personally reviewed and interpreted the pt's EKG: see above for interpretation  I have reviewed the patients home medications and made adjustments as needed Social Determinants of health:  Elderly         Final Clinical Impression(s) / ED Diagnoses Final diagnoses:  Acute pulmonary edema (HCC)  Chest  pain, unspecified type  Secondary hypertension  Nonintractable headache, unspecified chronicity pattern, unspecified headache type    Rx / DC Orders ED Discharge Orders     None  Rondel Baton, MD 12/07/22 [phone]

## 2022-12-07 NOTE — ED Triage Notes (Signed)
Pt was cleaning her house with clorox and began feeling dizzy, got a headache and mid sternum chest pain. Pt voiced some SOB. Room air at 97. Pt has a hx of HTN and took her bp med this morning.

## 2022-12-07 NOTE — ED Notes (Signed)
Patient transported to CT 

## 2022-12-11 ENCOUNTER — Ambulatory Visit (INDEPENDENT_AMBULATORY_CARE_PROVIDER_SITE_OTHER): Payer: 59

## 2022-12-11 ENCOUNTER — Ambulatory Visit (INDEPENDENT_AMBULATORY_CARE_PROVIDER_SITE_OTHER): Payer: 59 | Admitting: Family Medicine

## 2022-12-11 ENCOUNTER — Ambulatory Visit (HOSPITAL_COMMUNITY): Payer: 59 | Attending: Family Medicine

## 2022-12-11 ENCOUNTER — Encounter: Payer: Self-pay | Admitting: Family Medicine

## 2022-12-11 VITALS — BP 196/84 | HR 88 | Temp 97.6°F | Ht 61.75 in | Wt 136.8 lb

## 2022-12-11 DIAGNOSIS — I6340 Cerebral infarction due to embolism of unspecified cerebral artery: Secondary | ICD-10-CM | POA: Diagnosis not present

## 2022-12-11 DIAGNOSIS — I509 Heart failure, unspecified: Secondary | ICD-10-CM | POA: Diagnosis not present

## 2022-12-11 DIAGNOSIS — I110 Hypertensive heart disease with heart failure: Secondary | ICD-10-CM

## 2022-12-11 DIAGNOSIS — I6930 Unspecified sequelae of cerebral infarction: Secondary | ICD-10-CM

## 2022-12-11 DIAGNOSIS — Z981 Arthrodesis status: Secondary | ICD-10-CM | POA: Diagnosis not present

## 2022-12-11 DIAGNOSIS — J4489 Other specified chronic obstructive pulmonary disease: Secondary | ICD-10-CM | POA: Diagnosis not present

## 2022-12-11 DIAGNOSIS — R0600 Dyspnea, unspecified: Secondary | ICD-10-CM | POA: Diagnosis not present

## 2022-12-11 DIAGNOSIS — J449 Chronic obstructive pulmonary disease, unspecified: Secondary | ICD-10-CM | POA: Diagnosis not present

## 2022-12-11 DIAGNOSIS — R059 Cough, unspecified: Secondary | ICD-10-CM | POA: Diagnosis not present

## 2022-12-11 LAB — CMP14+EGFR: Chloride: 104 mmol/L (ref 96–106)

## 2022-12-11 LAB — BRAIN NATRIURETIC PEPTIDE

## 2022-12-11 LAB — CBC WITH DIFFERENTIAL/PLATELET

## 2022-12-11 MED ORDER — OLMESARTAN MEDOXOMIL 40 MG PO TABS: 40.0000 mg | ORAL_TABLET | Freq: Every day | ORAL | 1 refills | Status: DC

## 2022-12-11 MED ORDER — METOPROLOL SUCCINATE ER 50 MG PO TB24: 50.0000 mg | ORAL_TABLET | Freq: Every day | ORAL | 2 refills | Status: DC

## 2022-12-11 MED ORDER — FUROSEMIDE 40 MG PO TABS: 40.0000 mg | ORAL_TABLET | Freq: Every day | ORAL | 5 refills | Status: DC

## 2022-12-11 MED ORDER — POTASSIUM CHLORIDE CRYS ER 20 MEQ PO TBCR: 20.0000 meq | EXTENDED_RELEASE_TABLET | Freq: Every day | ORAL | 5 refills | Status: DC

## 2022-12-11 MED ORDER — HYDRALAZINE HCL 50 MG PO TABS: 50.0000 mg | ORAL_TABLET | Freq: Three times a day (TID) | ORAL | 3 refills | Status: DC

## 2022-12-11 NOTE — Progress Notes (Signed)
Subjective:  Patient ID: [name], female    DOB: Sep 30, 1940  Age: 82 y.o. MRN: [id number]  CC: ER FOLLOW UP and Hypertension   HPI [name] presents for high blood pressure and dyspnea. Gasping for breath. Rattling. Heaving just walking bedroom to bathroom for the last 2-3 days. Chest pain continues since ED, but not as bad. Had CXR that showed interstitial edema in E.D.Also notes legs swelling.  Basal ganglia CVA noted at E.D. on CT scan. Has been taking plavix.  Needs help at home. Cannot take care of everything anymore. Requesting aide.     12/11/2022    9:24 AM 12/11/2022    9:15 AM 11/07/2022    8:52 AM  Depression screen PHQ 2/9  Decreased Interest 0 0 0  Down, Depressed, Hopeless 1 0 0  PHQ - 2 Score 1 0 0  Altered sleeping 0  0  Tired, decreased energy 1  0  Change in appetite 3  0  Feeling bad or failure about yourself  0  0  Trouble concentrating 1  0  Moving slowly or fidgety/restless 1  0  Suicidal thoughts 0  0  PHQ-9 Score 7  0  Difficult doing work/chores Extremely dIfficult  Not difficult at all    History [name] has a past medical history of Arthritis, Atypical chest pain (03/19/2022), GERD (gastroesophageal reflux disease), Headache(784.0), Hepatitis, History of gout, Hypertension, Hypertensive encephalopathy, 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   She has a past surgical history that includes Abdominal hysterectomy; Anterior cervical corpectomy (03/07/2012); Tonsillectomy; Multiple tooth extractions; Cataract extraction w/PHACO (Left, 04/12/2017); and Cataract extraction w/PHACO (Right, 05/03/2017).   Her family history includes Asthma in her daughter; Bipolar disorder in her daughter, daughter, daughter, daughter, and son; Drug abuse in her daughter; Heart disease in her daughter and daughter; Hypertension in her daughter. She was adopted.She reports that she quit  smoking about 8 months ago. Her smoking use included cigarettes. She started smoking about 62 years ago. She has a 30.9 pack-year smoking history. She has never used smokeless tobacco. She reports current alcohol use. She reports that she does not use drugs.    ROS Review of Systems  Constitutional:  Positive for activity change and fatigue. Negative for fever.  HENT: Negative.    Eyes:  Positive for visual disturbance (blurred, can't read for 4-5 days.).  Respiratory:  Positive for shortness of breath and wheezing.   Cardiovascular:  Positive for chest pain and leg swelling.  Gastrointestinal:  Negative for abdominal pain.  Musculoskeletal:  Negative for arthralgias.  Psychiatric/Behavioral:  Positive for agitation.     Objective:  BP (!) 196/84   Pulse 88   Temp 97.6 F (36.4 C)   Ht 5' 1.75" (1.568 m)   Wt 136 lb 12.8 oz (62.1 kg)   SpO2 98%   BMI 25.22 kg/m   BP Readings from Last 3 Encounters:  12/11/22 (!) 196/84  12/07/22 (!) 169/90  10/30/22 (!) 145/66    Wt Readings from Last 3 Encounters:  12/11/22 136 lb 12.8 oz (62.1 kg)  12/07/22 130 lb (59 kg)  11/07/22 136 lb (61.7 kg)     Physical Exam Constitutional:      General: She is not in acute distress.    Appearance: She is well-developed. She is ill-appearing.  HENT:     Head: Normocephalic and atraumatic.  Eyes:     Conjunctiva/sclera: Conjunctivae normal.     Pupils:  Pupils are equal, round, and reactive to light.  Neck:     Thyroid: No thyromegaly.  Cardiovascular:     Rate and Rhythm: Normal rate and regular rhythm.     Heart sounds: Normal heart sounds. No murmur heard. Pulmonary:     Effort: Pulmonary effort is normal. No respiratory distress.     Breath sounds: No stridor. Wheezing, rhonchi and rales present.  Abdominal:     General: Bowel sounds are normal. There is no distension.     Palpations: Abdomen is soft.     Tenderness: There is no abdominal tenderness.  Musculoskeletal:         General: Swelling (BLE 2+) present. Normal range of motion.     Cervical back: Normal range of motion and neck supple.  Lymphadenopathy:     Cervical: No cervical adenopathy.  Skin:    General: Skin is warm and dry.  Neurological:     Mental Status: She is alert and oriented to person, place, and time.  Psychiatric:        Behavior: Behavior normal.        Thought Content: Thought content normal.        Judgment: Judgment normal.       Assessment & Plan:   [name] was seen today for er follow up and hypertension.  Diagnoses and all orders for this visit:  Congestive heart failure, unspecified HF chronicity, unspecified heart failure type (HCC)  Cerebrovascular accident (CVA) due to embolism of cerebral artery (HCC) -     AMB Referral to Chronic Care Management Services -     Ambulatory referral to Home Health -     MR BRAIN W WO CONTRAST; Future -     Brain natriuretic peptide -     CBC with Differential/Platelet -     CMP14+EGFR -     DG Chest 2 View; Future  COPD with chronic bronchitis  Other orders -     potassium chloride SA (KLOR-CON M) 20 MEQ tablet; Take 1 tablet (20 mEq total) by mouth daily. For potassium replacement/ supplement -     furosemide (LASIX) 40 MG tablet; Take 1 tablet (40 mg total) by mouth daily. For swelling -     metoprolol succinate (TOPROL-XL) 50 MG 24 hr tablet; Take 1 tablet (50 mg total) by mouth daily. For blood pressure control -     hydrALAZINE (APRESOLINE) 50 MG tablet; Take 1 tablet (50 mg total) by mouth 3 (three) times daily. -     olmesartan (BENICAR) 40 MG tablet; Take 1 tablet (40 mg total) by mouth daily. For blood pressure       I have discontinued Itsel V. Schnoebelen's bisoprolol and predniSONE. I am also having her start on potassium chloride SA, furosemide, metoprolol succinate, and olmesartan. Additionally, I am having her maintain her atorvastatin, cyclobenzaprine, amLODipine, isosorbide dinitrate, cyanocobalamin,  clopidogrel, ezetimibe, budesonide-formoterol, budesonide, arformoterol, albuterol, acetaminophen, Vitamin D (Ergocalciferol), and hydrALAZINE.  Allergies as of 12/11/2022       Reactions   Penicillins Anaphylaxis, Swelling, Rash, Other (See Comments)   Has patient had a PCN reaction causing immediate rash, facial/tongue/throat swelling, SOB or lightheadedness with hypotension: yes Has patient had a PCN reaction causing severe rash involving mucus membranes or skin necrosis: no Has patient had a PCN reaction that required hospitalization: yes Has patient had a PCN reaction occurring within the last 10 years: no If all of the above answers are "NO", then may proceed with Cephalosporin use.  Medication List        Accurate as of December 11, 2022  5:50 PM. If you have any questions, ask your nurse or doctor.          STOP taking these medications    bisoprolol 5 MG tablet Commonly known as: ZEBETA Stopped by: Valery Amedee   predniSONE 20 MG tablet Commonly known as: DELTASONE Stopped by: Zai Chmiel       TAKE these medications    acetaminophen 500 MG tablet Commonly known as: TYLENOL Take 2 tablets (1,000 mg total) by mouth 3 (three) times daily.   albuterol 108 (90 Base) MCG/ACT inhaler Commonly known as: VENTOLIN HFA Inhale 2 puffs into the lungs every 6 (six) hours as needed for wheezing or shortness of breath.   amLODipine 10 MG tablet Commonly known as: NORVASC Take 1 tablet (10 mg total) by mouth daily.   arformoterol 15 MCG/2ML Nebu Commonly known as: BROVANA Take 2 mLs (15 mcg total) by nebulization 2 (two) times daily.   atorvastatin 80 MG tablet Commonly known as: LIPITOR Take 1 tablet (80 mg total) by mouth daily.   budesonide 0.5 MG/2ML nebulizer solution Commonly known as: PULMICORT Take 2 mLs (0.5 mg total) by nebulization 2 (two) times daily.   budesonide-formoterol 160-4.5 MCG/ACT inhaler Commonly known as: SYMBICORT Inhale 2 puffs  into the lungs 2 (two) times daily.   clopidogrel 75 MG tablet Commonly known as: PLAVIX TAKE ONE TABLET ONCE DAILY   cyanocobalamin 1000 MCG tablet Take 1 tablet (1,000 mcg total) by mouth daily.   cyclobenzaprine 5 MG tablet Commonly known as: FLEXERIL TAKE ONE TABLET THREE TIMES DAILY AS NEEDED FOR MUSCLE SPASMS What changed: See the new instructions.   ezetimibe 10 MG tablet Commonly known as: ZETIA Take 1 tablet (10 mg total) by mouth daily.   furosemide 40 MG tablet Commonly known as: LASIX Take 1 tablet (40 mg total) by mouth daily. For swelling Started by: Azula Zappia   hydrALAZINE 50 MG tablet Commonly known as: APRESOLINE Take 1 tablet (50 mg total) by mouth 3 (three) times daily.   isosorbide dinitrate 20 MG tablet Commonly known as: ISORDIL Take 1 tablet (20 mg total) by mouth 3 (three) times daily.   metoprolol succinate 50 MG 24 hr tablet Commonly known as: TOPROL-XL Take 1 tablet (50 mg total) by mouth daily. For blood pressure control Started by: Corra Kaine   olmesartan 40 MG tablet Commonly known as: Benicar Take 1 tablet (40 mg total) by mouth daily. For blood pressure Started by: Taariq Leitz   potassium chloride SA 20 MEQ tablet Commonly known as: KLOR-CON M Take 1 tablet (20 mEq total) by mouth daily. For potassium replacement/ supplement Started by: Lety Cullens   Vitamin D (Ergocalciferol) 1.25 MG (50000 UNIT) Caps capsule Commonly known as: DRISDOL Take 1 capsule (50,000 Units total) by mouth every 7 (seven) days.         Follow-up: Return in about 1 week (around 12/18/2022).  Mechele Claude, M.D.

## 2022-12-12 ENCOUNTER — Telehealth: Payer: Self-pay

## 2022-12-12 LAB — CBC WITH DIFFERENTIAL/PLATELET
Basophils Absolute: 0 10*3/uL (ref 0.0–0.2)
Basos: 0 %
EOS (ABSOLUTE): 0 10*3/uL (ref 0.0–0.4)
Eos: 0 %
Hematocrit: 46.9 % — ABNORMAL HIGH (ref 34.0–46.6)
Hemoglobin: 15.1 g/dL (ref 11.1–15.9)
Immature Grans (Abs): 0 10*3/uL (ref 0.0–0.1)
Lymphocytes Absolute: 0.4 10*3/uL — ABNORMAL LOW (ref 0.7–3.1)
Lymphs: 5 %
MCH: 30.2 pg (ref 26.6–33.0)
MCHC: 32.2 g/dL (ref 31.5–35.7)
MCV: 94 fL (ref 79–97)
Monocytes: 1 %
Neutrophils: 94 %
Platelets: 152 10*3/uL (ref 150–450)
RBC: 5 x10E6/uL (ref 3.77–5.28)
RDW: 13.3 % (ref 11.7–15.4)
WBC: 8.3 10*3/uL (ref 3.4–10.8)

## 2022-12-12 LAB — CMP14+EGFR
ALT: 11 IU/L (ref 0–32)
AST: 18 IU/L (ref 0–40)
Albumin: 4.3 g/dL (ref 3.8–4.8)
Alkaline Phosphatase: 59 IU/L (ref 44–121)
BUN/Creatinine Ratio: 21 (ref 12–28)
BUN: 28 mg/dL — ABNORMAL HIGH (ref 8–27)
Bilirubin Total: 0.5 mg/dL (ref 0.0–1.2)
CO2: 23 mmol/L (ref 20–29)
Calcium: 9.7 mg/dL (ref 8.7–10.3)
Creatinine, Ser: 1.35 mg/dL — ABNORMAL HIGH (ref 0.57–1.00)
Potassium: 4.4 mmol/L (ref 3.5–5.2)
Sodium: 142 mmol/L (ref 134–144)
Total Protein: 7 g/dL (ref 6.0–8.5)
eGFR: 40 mL/min/{1.73_m2} — ABNORMAL LOW (ref >59)

## 2022-12-12 NOTE — Progress Notes (Signed)
  Chronic Care Management   Note  12/12/2022 Name: [name] MRN: [id number] DOB: 06-16-1940  [name] is a 82 y.o. year old female who is a primary care patient of Stacks, Broadus John, MD. I reached out to [name] by phone today in response to a referral sent by [name]'s PCP.  [name] was given information about Chronic Care Management services today including:  CCM service includes personalized support from designated clinical staff supervised by the physician, including individualized plan of care and coordination with other care providers 24/7 contact phone numbers for assistance for urgent and routine care needs. Service will only be billed when office clinical staff spend 20 minutes or more in a month to coordinate care. Only one practitioner may furnish and bill the service in a calendar month. The patient may stop CCM services at amy time (effective at the end of the month) by phone call to the office staff. The patient will be responsible for cost sharing (co-pay) or up to 20% of the service fee (after annual deductible is met)  [name]  agreedto scheduling an appointment with the CCM RN Case Manager   Follow up plan: Patient agreed to scheduled appointment with RN Case Manager on 12/13/2022 LCSW 01/03/2023(date/time).   Penne Lash, RMA Care Guide [facility]  [place], [place] [address] Direct Dial: [phone] [email or web address]

## 2022-12-12 NOTE — Progress Notes (Signed)
  Chronic Care Management   Note  12/12/2022 Name: GERALDY AKRIDGE MRN: [id number] DOB: 08-Nov-1940  EMON MIGGINS is a 82 y.o. year old female who is a primary care patient of Stacks, Broadus John, MD. I reached out to [name] by phone today in response to a referral sent by Ms. Rex Kras Welz's PCP.  The first contact attempt was unsuccessful.   Follow up plan: Additional outreach attempts will be made.  Penne Lash, RMA Care Guide [facility]  [place], [place] [address] Direct Dial: [phone] [email or web address]

## 2022-12-13 ENCOUNTER — Ambulatory Visit (INDEPENDENT_AMBULATORY_CARE_PROVIDER_SITE_OTHER): Payer: 59 | Admitting: *Deleted

## 2022-12-13 DIAGNOSIS — I6340 Cerebral infarction due to embolism of unspecified cerebral artery: Secondary | ICD-10-CM

## 2022-12-13 DIAGNOSIS — I509 Heart failure, unspecified: Secondary | ICD-10-CM

## 2022-12-13 NOTE — Chronic Care Management (AMB) (Signed)
Chronic Care Management   CCM RN Visit Note  12/13/2022 Name: [name] MRN: [id number] DOB: 1941-03-10  Subjective: [name] is a 82 y.o. year old female who is a primary care patient of Stacks, Broadus John, MD. The patient was referred to the Chronic Care Management team for assistance with care management needs subsequent to provider initiation of CCM services and plan of care.    Today's Visit:  Engaged with patient by telephone for initial visit.     SDOH Interventions Today    Flowsheet Row Most Recent Value  SDOH Interventions   Food Insecurity Interventions Intervention Not Indicated  Housing Interventions Intervention Not Indicated  Transportation Interventions Intervention Not Indicated  Utilities Interventions Intervention Not Indicated  Financial Strain Interventions Intervention Not Indicated  Physical Activity Interventions Patient Declined  Stress Interventions Intervention Not Indicated  Social Connections Interventions Intervention Not Indicated  Health Literacy Interventions Intervention Not Indicated         Goals Addressed             This Visit's Progress    CCM (CONGESTIVE HEART FAILURE) EXPECTED OUTCOME: MONITOR, SELF-MANAGE AND REDUCE SYMPTOMS OF CONGESTIVE HEART FAILURE       Current Barriers:  Knowledge Deficits related to Congestive Heart Failure management Care Coordination needs related to application for PCS/ assistance in the home in a patient with CHF Chronic Disease Management support and education needs related to Congestive Heart Failure, diet No Advanced Directives in place- pt requests information Patient reports she lives with significant other [name] (he is going through his own health issues at present), pt reports she is overall independent but is starting to feel like she is having issues with bathing, sometimes her partner has to assist her, pt does not use any assistive devices, states she would consider a shower  seat and this may be helpful. Patient states she plans to apply for PCS through medicaid benefit with assistance of Child psychotherapist.  Patient reports she quit smoking 4 months ago and has done well with this, pt uses [facility] transportation benefit for most all transportation needs. Patient reports she weighs daily with recent weight 138 pounds Patient reports she checks blood pressure BID and went to ED on 7/12 for elevated blood pressure, states " this is much better"  Planned Interventions: Basic overview and discussion of pathophysiology of Heart Failure reviewed Provided education on low sodium diet Reviewed Heart Failure Action Plan in depth and provided written copy Assessed need for readable accurate scales in home Provided education about placing scale on hard, flat surface Advised patient to weigh each morning after emptying bladder Discussed importance of daily weight and advised patient to weigh and record daily Reviewed role of diuretics in prevention of fluid overload and management of heart failure Discussed the importance of keeping all appointments with provider Screening for signs and symptoms of depression related to chronic disease state  Assessed social determinant of health barriers RN care manager collaborated with social worker inquiring about shower seat and Medicare does not cover cost and Medicaid most likely won't either unless chair is more advanced and is used for toileting,  Medical illustrator informed pt insurance will not reimburse and gave resources such as [employer], [employer] etc.   Symptom Management: Take medications as prescribed   Attend all scheduled provider appointments Call pharmacy for medication refills 3-7 days in advance of running out of medications Attend church or other social activities Call provider office for new concerns  or questions  Work with the Child psychotherapist to address care coordination needs and will continue to work with the  clinical team to address health care and disease management related needs call office if I gain more than 2 pounds in one day or 5 pounds in one week keep legs up while sitting track weight in diary use salt in moderation watch for swelling in feet, ankles and legs every day weigh myself daily eat more whole grains, fruits and vegetables, lean meats and healthy fats dress right for the weather, hot or cold Education mailed- heart failure action plan If you feel that a shower seat wound be helpful, check into resources such as [employer], [employer], [employer] in [facility]  Follow Up Plan: Telephone follow up appointment with care management team member scheduled for:  01/25/23 at 130 pm       CCM (CVA) EXPECTED OUTCOME: MONITOR, SELF-MANAGE AND REDUCE SYMPTOMS OF CVA       Current Barriers:  Knowledge Deficits related to CVA management Chronic Disease Management support and education needs related to CVA No Advanced Directives in place- pt requests documents be mailed Patient 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 and states " they think I had a light stroke when I went to ED on 7/12"  pt reports her blood pressure "was very high" Patient reports 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 Patient is drinking Ensure TID and eats at least 2 meals per day Patient stopped smoking 4 months ago  Planned Interventions: Reviewed Importance of taking all medications as prescribed Reviewed Importance of attending all scheduled provider appointments Advised to report any changes in symptoms or exercise tolerance Screening for signs and symptoms of depression related to chronic disease state;  Assessed social determinant of health barriers Assessed for signs and symptoms of stroke Reviewed the importance of exercise Assessed for cognitive impairment Assessed for fall status and safety in the home Assessed use of tobacco use Reviewed importance of keeping blood pressure under good  control for stroke prevention and overall healthy Advanced directives mailed Encouragement given for smoking cessation  Symptom Management: Take medications as prescribed   Attend all scheduled provider appointments Call pharmacy for medication refills 3-7 days in advance of running out of medications Attend church or other social activities Call provider office for new concerns or questions  call the Suicide and Crisis Lifeline: 988 call the [place] National Suicide Prevention Lifeline: 601 076 5112 or TTY: 570-745-6878 TTY (913)563-9926) to talk to a trained counselor call 1-800-273-TALK (toll free, 24 hour hotline) go to [facility] [address] [place] [phone]) call the [facility] Crisis Line: [phone] call 911 if experiencing a Mental Health or Behavioral Health Crisis  Advanced directives mailed  Follow Up Plan: Telephone follow up appointment with care management team member scheduled for: 01/25/23 at 130 pm          Plan:Telephone follow up appointment with care management team member scheduled for:  01/25/23 at 130 pm  Irving Shows [facility], BSN RN Case Manager [facility] [phone]

## 2022-12-13 NOTE — Patient Instructions (Signed)
Please call the care guide team at [phone] if you need to cancel or reschedule your appointment.   If you are experiencing a Mental Health or Behavioral Health Crisis or need someone to talk to, please call the Suicide and Crisis Lifeline: 988 call the [place] National Suicide Prevention Lifeline: 914 109 9476 or TTY: (716)691-3698 TTY (671)262-6588) to talk to a trained counselor call 1-800-273-TALK (toll free, 24 hour hotline) go to [facility] [address] [place] [phone]) call the [facility]: [phone] call 911   Following is a copy of the CCM Program Consent:  CCM service includes personalized support from designated clinical staff supervised by the physician, including individualized plan of care and coordination with other care providers 24/7 contact phone numbers for assistance for urgent and routine care needs. Service will only be billed when office clinical staff spend 20 minutes or more in a month to coordinate care. Only one practitioner may furnish and bill the service in a calendar month. The patient may stop CCM services at amy time (effective at the end of the month) by phone call to the office staff. The patient will be responsible for cost sharing (co-pay) or up to 20% of the service fee (after annual deductible is met)  Following is a copy of your full provider care plan:   Goals Addressed             This Visit's Progress    CCM (CONGESTIVE HEART FAILURE) EXPECTED OUTCOME: MONITOR, SELF-MANAGE AND REDUCE SYMPTOMS OF CONGESTIVE HEART FAILURE       Current Barriers:  Knowledge Deficits related to Congestive Heart Failure management Care Coordination needs related to application for PCS/ assistance in the home in a patient with CHF Chronic Disease Management support and education needs related to Congestive Heart Failure, diet No Advanced Directives in place- pt requests information Patient  reports she lives with significant other [name] (he is going through his own health issues at present), pt reports she is overall independent but is starting to feel like she is having issues with bathing, sometimes her partner has to assist her, pt does not use any assistive devices, states she would consider a shower seat and this may be helpful. Patient states she plans to apply for PCS through medicaid benefit with assistance of Child psychotherapist.  Patient reports she quit smoking 4 months ago and has done well with this, pt uses [facility] transportation benefit for most all transportation needs. Patient reports she weighs daily with recent weight 138 pounds Patient reports she checks blood pressure BID and went to ED on 7/12 for elevated blood pressure, states " this is much better"  Planned Interventions: Basic overview and discussion of pathophysiology of Heart Failure reviewed Provided education on low sodium diet Reviewed Heart Failure Action Plan in depth and provided written copy Assessed need for readable accurate scales in home Provided education about placing scale on hard, flat surface Advised patient to weigh each morning after emptying bladder Discussed importance of daily weight and advised patient to weigh and record daily Reviewed role of diuretics in prevention of fluid overload and management of heart failure Discussed the importance of keeping all appointments with provider Screening for signs and symptoms of depression related to chronic disease state  Assessed social determinant of health barriers RN care manager collaborated with social worker inquiring about shower seat and Medicare does not cover cost and Medicaid most likely won't either unless chair is more advanced  and is used for toileting,  RN care manager informed pt insurance will not reimburse and gave resources such as [employer], [employer] etc.   Symptom Management: Take medications as prescribed    Attend all scheduled provider appointments Call pharmacy for medication refills 3-7 days in advance of running out of medications Attend church or other social activities Call provider office for new concerns or questions  Work with the social worker to address care coordination needs and will continue to work with the clinical team to address health care and disease management related needs call office if I gain more than 2 pounds in one day or 5 pounds in one week keep legs up while sitting track weight in diary use salt in moderation watch for swelling in feet, ankles and legs every day weigh myself daily eat more whole grains, fruits and vegetables, lean meats and healthy fats dress right for the weather, hot or cold Education mailed- heart failure action plan If you feel that a shower seat wound be helpful, check into resources such as [employer], [employer], [employer] in [facility]  Follow Up Plan: Telephone follow up appointment with care management team member scheduled for:  01/25/23 at 130 pm       CCM (CVA) EXPECTED OUTCOME: MONITOR, SELF-MANAGE AND REDUCE SYMPTOMS OF CVA       Current Barriers:  Knowledge Deficits related to CVA management Chronic Disease Management support and education needs related to CVA No Advanced Directives in place- pt requests documents be mailed Patient 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 and states " they think I had a light stroke when I went to ED on 7/12"  pt reports her blood pressure "was very high" Patient reports 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 Patient is drinking Ensure TID and eats at least 2 meals per day Patient stopped smoking 4 months ago  Planned Interventions: Reviewed Importance of taking all medications as prescribed Reviewed Importance of attending all scheduled provider appointments Advised to report any changes in symptoms or exercise tolerance Screening for signs and symptoms of depression  related to chronic disease state;  Assessed social determinant of health barriers Assessed for signs and symptoms of stroke Reviewed the importance of exercise Assessed for cognitive impairment Assessed for fall status and safety in the home Assessed use of tobacco use Reviewed importance of keeping blood pressure under good control for stroke prevention and overall healthy Advanced directives mailed Encouragement given for smoking cessation  Symptom Management: Take medications as prescribed   Attend all scheduled provider appointments Call pharmacy for medication refills 3-7 days in advance of running out of medications Attend church or other social activities Call provider office for new concerns or questions  call the Suicide and Crisis Lifeline: 988 call the [place] National Suicide Prevention Lifeline: (605)009-9870 or TTY: 531 187 8123 TTY 786-003-8293) to talk to a trained counselor call 1-800-273-TALK (toll free, 24 hour hotline) go to [facility] [address], [place] [phone]) call the [facility] Crisis Line: ([phone]) [phone] call 911 if experiencing a Mental Health or Behavioral Health Crisis  Advanced directives mailed  Follow Up Plan: Telephone follow up appointment with care management team member scheduled for: 01/25/23 at 130 pm          The patient verbalized understanding of instructions, educational materials, and care plan provided today and agreed to receive a mailed copy of patient instructions, educational materials, and care plan.  Telephone follow up appointment with  care management team member scheduled for:  01/25/23 at 130 pm  Low-Sodium Eating Plan Salt (sodium) helps you keep a healthy balance of fluids in your body. Too much sodium can raise your blood pressure. It can also cause fluid and waste to be held in your body. Your health care provider or dietitian may recommend a low-sodium eating plan if  you have high blood pressure (hypertension), kidney disease, liver disease, or heart failure. Eating less sodium can help lower your blood pressure and reduce swelling. It can also protect your heart, liver, and kidneys. What are tips for following this plan? Reading food labels  Check food labels for the amount of sodium per serving. If you eat more than one serving, you must multiply the listed amount by the number of servings. Choose foods with less than 140 milligrams (mg) of sodium per serving. Avoid foods with 300 mg of sodium or more per serving. Always check how much sodium is in a product, even if the label says "unsalted" or "no salt added." Shopping  Buy products labeled as "low-sodium" or "no salt added." Buy fresh foods. Avoid canned foods and pre-made or frozen meals. Avoid canned, cured, or processed meats. Buy breads that have less than 80 mg of sodium per slice. Cooking  Eat more home-cooked food. Try to eat less restaurant, buffet, and fast food. Try not to add salt when you cook. Use salt-free seasonings or herbs instead of table salt or sea salt. Check with your provider or pharmacist before using salt substitutes. Cook with plant-based oils, such as canola, sunflower, or olive oil. Meal planning When eating at a restaurant, ask if your food can be made with less salt or no salt. Avoid dishes labeled as brined, pickled, cured, or smoked. Avoid dishes made with soy sauce, miso, or teriyaki sauce. Avoid foods that have monosodium glutamate (MSG) in them. MSG may be added to some restaurant food, sauces, soups, bouillon, and canned foods. Make meals that can be grilled, baked, poached, roasted, or steamed. These are often made with less sodium. General information Try to limit your sodium intake to 1,500-2,300 mg each day, or the amount told by your provider. What foods should I eat? Fruits Fresh, frozen, or canned fruit. Fruit juice. Vegetables Fresh or frozen  vegetables. "No salt added" canned vegetables. "No salt added" tomato sauce and paste. Low-sodium or reduced-sodium tomato and vegetable juice. Grains Low-sodium cereals, such as oats, puffed wheat and rice, and shredded wheat. Low-sodium crackers. Unsalted rice. Unsalted pasta. Low-sodium bread. Whole grain breads and whole grain pasta. Meats and other proteins Fresh or frozen meat, poultry, seafood, and fish. These should have no added salt. Low-sodium canned tuna and salmon. Unsalted nuts. Dried peas, beans, and lentils without added salt. Unsalted canned beans. Eggs. Unsalted nut butters. Dairy Milk. Soy milk. Cheese that is naturally low in sodium, such as ricotta cheese, fresh mozzarella, or Swiss cheese. Low-sodium or reduced-sodium cheese. Cream cheese. Yogurt. Seasonings and condiments Fresh and dried herbs and spices. Salt-free seasonings. Low-sodium mustard and ketchup. Sodium-free salad dressing. Sodium-free light mayonnaise. Fresh or refrigerated horseradish. Lemon juice. Vinegar. Other foods Homemade, reduced-sodium, or low-sodium soups. Unsalted popcorn and pretzels. Low-salt or salt-free chips. The items listed above may not be all the foods and drinks you can have. Talk to a dietitian to learn more. What foods should I avoid? Vegetables Sauerkraut, pickled vegetables, and relishes. Olives. [place] fries. Onion rings. Regular canned vegetables, except low-sodium or reduced-sodium items. Regular canned tomato sauce  and paste. Regular tomato and vegetable juice. Frozen vegetables in sauces. Grains Instant hot cereals. Bread stuffing, pancake, and biscuit mixes. Croutons. Seasoned rice or pasta mixes. Noodle soup cups. Boxed or frozen macaroni and cheese. Regular salted crackers. Self-rising flour. Meats and other proteins Meat or fish that is salted, canned, smoked, spiced, or pickled. Precooked or cured meat, such as sausages or meat loaves. [name]. Ham. Pepperoni. Hot dogs. Corned  beef. Chipped beef. Salt pork. Jerky. Pickled herring, anchovies, and sardines. Regular canned tuna. Salted nuts. Dairy Processed cheese and cheese spreads. Hard cheeses. Cheese curds. Blue cheese. Feta cheese. String cheese. Regular cottage cheese. Buttermilk. Canned milk. Fats and oils Salted butter. Regular margarine. Ghee. Bacon fat. Seasonings and condiments Onion salt, garlic salt, seasoned salt, table salt, and sea salt. Canned and packaged gravies. Worcestershire sauce. Tartar sauce. Barbecue sauce. Teriyaki sauce. Soy sauce, including reduced-sodium soy sauce. Steak sauce. Fish sauce. Oyster sauce. Cocktail sauce. Horseradish that you find on the shelf. Regular ketchup and mustard. Meat flavorings and tenderizers. Bouillon cubes. Hot sauce. Pre-made or packaged marinades. Pre-made or packaged taco seasonings. Relishes. Regular salad dressings. Salsa. Other foods Salted popcorn and pretzels. Corn chips and puffs. Potato and tortilla chips. Canned or dried soups. Pizza. Frozen entrees and pot pies. The items listed above may not be all the foods and drinks you should avoid. Talk to a dietitian to learn more. This information is not intended to replace advice given to you by your health care provider. Make sure you discuss any questions you have with your health care provider. Document Revised: 05/31/2022 Document Reviewed: 05/31/2022 Elsevier Patient Education  2024 [employer]. Heart Failure Action Plan A heart failure action plan helps you understand what to do when you have symptoms of heart failure. Your action plan is a color-coded plan that lists the symptoms to watch for and indicates what actions to take. If you have symptoms in the red zone, you need medical care right away. If you have symptoms in the yellow zone, you are having problems. If you have symptoms in the green zone, you are doing well. Follow the plan that was created by you and your health care provider. Review your  plan each time you visit your health care provider. Red zone These signs and symptoms mean you should get medical help right away: You have trouble breathing when resting. You have a dry cough that is getting worse. You have swelling or pain in your legs or abdomen that is getting worse. You suddenly gain more than 2-3 lb (0.9-1.4 kg) in 24 hours, or more than 5 lb (2.3 kg) in a week. This amount may be more or less depending on your condition. You have trouble staying awake or you feel confused. You have chest pain. You do not have an appetite. You pass out. You have worsening sadness or depression. If you have any of these symptoms, call your local emergency services (911 in the [place] right away. Do not drive yourself to the hospital. Yellow zone These signs and symptoms mean your condition may be getting worse and you should make some changes: You have trouble breathing when you are active, or you need to sleep with your head raised on extra pillows to help you breathe. You have swelling in your legs or abdomen. You gain 2-3 lb (0.9-1.4 kg) in 24 hours, or 5 lb (2.3 kg) in a week. This amount may be more or less depending on your condition. You get tired easily. You  have trouble sleeping. You have a dry cough. If you have any of these symptoms: Contact your health care provider within the next day. Your health care provider may adjust your medicines. Green zone These signs mean you are doing well and can continue what you are doing: You do not have shortness of breath. You have very little swelling or no new swelling. Your weight is stable (no gain or loss). You have a normal activity level. You do not have chest pain or any other new symptoms. Follow these instructions at home: Take over-the-counter and prescription medicines only as told by your health care provider. Weigh yourself daily. Your target weight is __________ lb (__________ kg). Call your health care provider if you  gain more than __________ lb (__________ kg) in 24 hours, or more than __________ lb (__________ kg) in a week. Health care provider name: _____________________________________________________ Health care provider phone number: _____________________________________________________ Eat a heart-healthy diet. Work with a diet and nutrition specialist (dietitian) to create an eating plan that is best for you. Keep all follow-up visits. This is important. Where to find more information American Heart Association: Summary A heart failure action plan helps you understand what to do when you have symptoms of heart failure. Follow the action plan that was created by you and your health care provider. Get help right away if you have any symptoms in the red zone. This information is not intended to replace advice given to you by your health care provider. Make sure you discuss any questions you have with your health care provider. Document Revised: 08/22/2021 Document Reviewed: 12/28/2019 Elsevier Patient Education  2024 [employer].

## 2022-12-13 NOTE — Plan of Care (Signed)
Chronic Care Management Provider Comprehensive Care Plan    12/13/2022 Name: [name] MRN: [id number] DOB: [birth year]  Referral to Chronic Care Management (CCM) services was placed by Provider:  Mechele Claude MD on Date: 12/11/22.  Chronic Condition 1: CVA Provider Assessment and Plan Cerebrovascular accident (CVA) due to embolism of cerebral artery (HCC) -     AMB Referral to Chronic Care Management Services -     Ambulatory referral to Home Health -     MR BRAIN W WO CONTRAST; Future -     Brain natriuretic peptide -     CBC with Differential/Platelet -     CMP14+EGFR -     DG Chest 2 View; Future   Expected Outcome/Goals Addressed This Visit (Provider CCM goals/Provider Assessment and plan  CCM (CVA) EXPECTED OUTCOME: MONITOR, SELF-MANAGE AND REDUCE SYMPTOMS OF CVA  Symptom Management Condition 1: Take medications as prescribed   Attend all scheduled provider appointments Call pharmacy for medication refills 3-7 days in advance of running out of medications Attend church or other social activities Call provider office for new concerns or questions  call the Suicide and Crisis Lifeline: 988 call the [place] National Suicide Prevention Lifeline: 443-395-3265 or TTY: (719)886-5871 TTY 909-273-5432) to talk to a trained counselor call 1-800-273-TALK (toll free, 24 hour hotline) go to [facility] [address] [place] [phone]) call the [facility] Crisis Line: 4172833025 call 911 if experiencing a Mental Health or Behavioral Health Crisis  Advanced directives mailed  Chronic Condition 2: Congestive Heart Failure Provider Assessment and Plan Congestive heart failure, unspecified HF chronicity, unspecified heart failure type (HCC)    Expected Outcome/Goals Addressed This Visit (Provider CCM goals/Provider Assessment and plan  CCM (CONGESTIVE HEART FAILURE) EXPECTED OUTCOME: MONITOR, SELF-MANAGE AND REDUCE SYMPTOMS OF  CONGESTIVE HEART FAILURE  Symptom Management Condition 2: Take medications as prescribed   Attend all scheduled provider appointments Call pharmacy for medication refills 3-7 days in advance of running out of medications Attend church or other social activities Call provider office for new concerns or questions  Work with the social worker to address care coordination needs and will continue to work with the clinical team to address health care and disease management related needs call office if I gain more than 2 pounds in one day or 5 pounds in one week keep legs up while sitting track weight in diary use salt in moderation watch for swelling in feet, ankles and legs every day weigh myself daily eat more whole grains, fruits and vegetables, lean meats and healthy fats dress right for the weather, hot or cold Education mailed- heart failure action plan If you feel that a shower seat wound be helpful, check into resources such as [employer], [employer], [employer] [facility]  Problem List Patient Active Problem List   Diagnosis Date Noted   B12 deficiency 09/20/2022   Pressure injury of skin 09/20/2022   Hypertensive urgency 09/16/2022   Left renal artery stenosis (HCC) 09/16/2022   GERD (gastroesophageal reflux disease) 03/25/2022   AKI (acute kidney injury) (HCC) 03/25/2022   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   Atypical chest pain 03/19/2022   Unintentional weight loss 03/19/2022   Ileus (HCC) 05/15/2021   Back pain with sciatica 02/21/2021   Rash 11/21/2020   Hospital discharge follow-up 02/04/2020   PAD (peripheral artery disease) -Atherosclerotic ulcer of aorta -posterior aspect of the descending aorta measuring up to 4 mm in thickness 01/24/2020   Tobacco abuse  01/23/2020   PAD/Atherosclerotic ulcer of aorta -posterior aspect of the descending aorta measuring up to 4 mm in thickness 01/22/2020   GAD (generalized anxiety disorder) 08/26/2019    Osteopenia of multiple sites 08/26/2019   Depression, recurrent (HCC) 04/21/2019   Stage 3a chronic kidney disease (HCC) 04/21/2019   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   Hyperlipidemia 07/09/2016   History of TIA (transient ischemic attack) 03/06/2016   Cigarette nicotine dependence without complication 03/06/2016   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HCC) 02/13/2012   Left-sided weakness 02/11/2012   Essential hypertension with goal blood pressure less than 130/80 02/11/2012   CALCANEAL SPUR 10/05/2009    Medication Management  Current Outpatient Medications:    acetaminophen (TYLENOL) 500 MG tablet, Take 2 tablets (1,000 mg total) by mouth 3 (three) times daily., Disp: 180 tablet, Rfl: PRN   albuterol (VENTOLIN HFA) 108 (90 Base) MCG/ACT inhaler, Inhale 2 puffs into the lungs every 6 (six) hours as needed for wheezing or shortness of breath., Disp: 1 each, Rfl: 11   amLODipine (NORVASC) 10 MG tablet, Take 1 tablet (10 mg total) by mouth daily., Disp: 90 tablet, Rfl: 3   arformoterol (BROVANA) 15 MCG/2ML NEBU, Take 2 mLs (15 mcg total) by nebulization 2 (two) times daily., Disp: 120 mL, Rfl: 1   atorvastatin (LIPITOR) 80 MG tablet, Take 1 tablet (80 mg total) by mouth daily., Disp: 90 tablet, Rfl: 3   budesonide (PULMICORT) 0.5 MG/2ML nebulizer solution, Take 2 mLs (0.5 mg total) by nebulization 2 (two) times daily., Disp: 480 mL, Rfl: 0   budesonide-formoterol (SYMBICORT) 160-4.5 MCG/ACT inhaler, Inhale 2 puffs into the lungs 2 (two) times daily., Disp: 1 each, Rfl: 5   clopidogrel (PLAVIX) 75 MG tablet, TAKE ONE TABLET ONCE DAILY, Disp: 90 tablet, Rfl: 0   cyclobenzaprine (FLEXERIL) 5 MG tablet, TAKE ONE TABLET THREE TIMES DAILY AS NEEDED FOR MUSCLE SPASMS (Patient taking differently: Take 5 mg by mouth 3 (three) times daily as needed for muscle spasms.), Disp: 30 tablet, Rfl: 5   ezetimibe (ZETIA) 10 MG tablet, Take 1 tablet  (10 mg total) by mouth daily., Disp: 90 tablet, Rfl: 3   furosemide (LASIX) 40 MG tablet, Take 1 tablet (40 mg total) by mouth daily. For swelling, Disp: 30 tablet, Rfl: 5   hydrALAZINE (APRESOLINE) 50 MG tablet, Take 1 tablet (50 mg total) by mouth 3 (three) times daily., Disp: 270 tablet, Rfl: 3   isosorbide dinitrate (ISORDIL) 20 MG tablet, Take 1 tablet (20 mg total) by mouth 3 (three) times daily., Disp: 90 tablet, Rfl: 2   metoprolol succinate (TOPROL-XL) 50 MG 24 hr tablet, Take 1 tablet (50 mg total) by mouth daily. For blood pressure control, Disp: 30 tablet, Rfl: 2   olmesartan (BENICAR) 40 MG tablet, Take 1 tablet (40 mg total) by mouth daily. For blood pressure, Disp: 90 tablet, Rfl: 1   potassium chloride SA (KLOR-CON M) 20 MEQ tablet, Take 1 tablet (20 mEq total) by mouth daily. For potassium replacement/ supplement, Disp: 30 tablet, Rfl: 5   vitamin B-12 1000 MCG tablet, Take 1 tablet (1,000 mcg total) by mouth daily., Disp: 30 tablet, Rfl: 2   Vitamin D, Ergocalciferol, (DRISDOL) 1.25 MG (50000 UNIT) CAPS capsule, Take 1 capsule (50,000 Units total) by mouth every 7 (seven) days. (Patient not taking: Reported on 12/13/2022), Disp: 13 capsule, Rfl: 3  Cognitive Assessment Identity Confirmed: : Name; DOB Cognitive Status: Normal   Functional Assessment Hearing  Difficulty or Deaf: no Wear Glasses or Blind: yes Vision Management: can see well w/ glasses Concentrating, Remembering or Making Decisions Difficulty (CP): no Difficulty Communicating: no Difficulty Eating/Swallowing: no Walking or Climbing Stairs Difficulty: yes Mobility Management: takes extra time Dressing/Bathing Difficulty: yes Dressing/Bathing: bathing difficulty, assistance 1 person Dressing/Bathing Management: sometimes has to ask caregiver for assistance Doing Errands Independently Difficulty (such as shopping) (CP): yes Errands Management: uses [employer] benefit Zenaida Niece transportation)   Estate agent Source of Support/Comfort: significant other Name of Support/Comfort Primary Source: [employer] People in Home: significant other   Planned Interventions  Basic overview and discussion of pathophysiology of Heart Failure reviewed Provided education on low sodium diet Reviewed Heart Failure Action Plan in depth and provided written copy Assessed need for readable accurate scales in home Provided education about placing scale on hard, flat surface Advised patient to weigh each morning after emptying bladder Discussed importance of daily weight and advised patient to weigh and record daily Reviewed role of diuretics in prevention of fluid overload and management of heart failure Discussed the importance of keeping all appointments with provider Screening for signs and symptoms of depression related to chronic disease state  Assessed social determinant of health barriers RN care manager collaborated with social worker inquiring about shower seat and Medicare does not cover cost and Medicaid most likely won't either unless chair is more advanced and is used for toileting,  Medical illustrator informed pt insurance will not reimburse and gave resources such as [employer], Special educational needs teacher.  Reviewed Importance of taking all medications as prescribed Reviewed Importance of attending all scheduled provider appointments Advised to report any changes in symptoms or exercise tolerance Screening for signs and symptoms of depression related to chronic disease state;  Assessed social determinant of health barriers Assessed for signs and symptoms of stroke Reviewed the importance of exercise Assessed for cognitive impairment Assessed for fall status and safety in the home Assessed use of tobacco use Reviewed importance of keeping blood pressure under good control for stroke prevention and overall healthy Advanced directives mailed Encouragement given for smoking cessation  Interaction and  coordination with outside resources, practitioners, and providers See CCM Referral  Care Plan: Printed and mailed to patient

## 2022-12-18 DIAGNOSIS — R634 Abnormal weight loss: Secondary | ICD-10-CM | POA: Diagnosis not present

## 2022-12-18 DIAGNOSIS — Z87891 Personal history of nicotine dependence: Secondary | ICD-10-CM | POA: Diagnosis not present

## 2022-12-18 DIAGNOSIS — Z7902 Long term (current) use of antithrombotics/antiplatelets: Secondary | ICD-10-CM | POA: Diagnosis not present

## 2022-12-18 DIAGNOSIS — Z9181 History of falling: Secondary | ICD-10-CM | POA: Diagnosis not present

## 2022-12-18 DIAGNOSIS — J4489 Other specified chronic obstructive pulmonary disease: Secondary | ICD-10-CM | POA: Diagnosis not present

## 2022-12-18 DIAGNOSIS — Z7951 Long term (current) use of inhaled steroids: Secondary | ICD-10-CM | POA: Diagnosis not present

## 2022-12-18 DIAGNOSIS — Z8673 Personal history of transient ischemic attack (TIA), and cerebral infarction without residual deficits: Secondary | ICD-10-CM | POA: Diagnosis not present

## 2022-12-18 DIAGNOSIS — K759 Inflammatory liver disease, unspecified: Secondary | ICD-10-CM | POA: Diagnosis not present

## 2022-12-18 DIAGNOSIS — M1990 Unspecified osteoarthritis, unspecified site: Secondary | ICD-10-CM | POA: Diagnosis not present

## 2022-12-18 DIAGNOSIS — I509 Heart failure, unspecified: Secondary | ICD-10-CM | POA: Diagnosis not present

## 2022-12-18 DIAGNOSIS — K219 Gastro-esophageal reflux disease without esophagitis: Secondary | ICD-10-CM | POA: Diagnosis not present

## 2022-12-18 DIAGNOSIS — I110 Hypertensive heart disease with heart failure: Secondary | ICD-10-CM | POA: Diagnosis not present

## 2022-12-18 DIAGNOSIS — Q824 Ectodermal dysplasia (anhidrotic): Secondary | ICD-10-CM | POA: Diagnosis not present

## 2022-12-18 DIAGNOSIS — M109 Gout, unspecified: Secondary | ICD-10-CM | POA: Diagnosis not present

## 2022-12-18 DIAGNOSIS — Z7982 Long term (current) use of aspirin: Secondary | ICD-10-CM | POA: Diagnosis not present

## 2022-12-18 NOTE — Progress Notes (Signed)
Your chest x-ray looked normal. Thanks, WS.

## 2022-12-19 ENCOUNTER — Other Ambulatory Visit: Payer: 59

## 2022-12-19 ENCOUNTER — Ambulatory Visit (INDEPENDENT_AMBULATORY_CARE_PROVIDER_SITE_OTHER): Payer: 59 | Admitting: Family Medicine

## 2022-12-19 ENCOUNTER — Other Ambulatory Visit: Payer: Self-pay | Admitting: Family Medicine

## 2022-12-19 ENCOUNTER — Encounter: Payer: Self-pay | Admitting: Family Medicine

## 2022-12-19 ENCOUNTER — Ambulatory Visit: Payer: 59

## 2022-12-19 ENCOUNTER — Ambulatory Visit
Admission: RE | Admit: 2022-12-19 | Discharge: 2022-12-19 | Disposition: A | Discharge status: Home / Self Care | Payer: 59 | Source: Ambulatory Visit | Attending: Family Medicine | Admitting: Family Medicine

## 2022-12-19 VITALS — BP 167/61 | HR 69 | Temp 97.4°F | Ht 61.75 in | Wt 140.4 lb

## 2022-12-19 DIAGNOSIS — Z1231 Encounter for screening mammogram for malignant neoplasm of breast: Secondary | ICD-10-CM

## 2022-12-19 DIAGNOSIS — J4489 Other specified chronic obstructive pulmonary disease: Secondary | ICD-10-CM | POA: Diagnosis not present

## 2022-12-19 DIAGNOSIS — I509 Heart failure, unspecified: Secondary | ICD-10-CM | POA: Diagnosis not present

## 2022-12-19 MED ORDER — CLOPIDOGREL BISULFATE 75 MG PO TABS: 75.0000 mg | ORAL_TABLET | Freq: Every day | ORAL | 3 refills | Status: DC

## 2022-12-19 NOTE — Progress Notes (Signed)
Subjective:  Patient ID: [name], female    DOB: 05-Dec-1940  Age: 82 y.o. MRN: [id number]  CC: Medical Management of Chronic Issues   HPI [name] presents for recheck of breathing. Much better. Perfect yesterday.  When she was yound her doctor told her to smoke 10 cigarettes a day so she could lose weight. She weighed 270 lb at that time. Developed COPD as a result.     12/19/2022    9:25 AM 12/19/2022    9:19 AM 12/13/2022   12:47 PM  Depression screen PHQ 2/9  Decreased Interest 1 0 0  Down, Depressed, Hopeless 1 0 1  PHQ - 2 Score 2 0 1  Altered sleeping 1    Tired, decreased energy 1    Change in appetite 1    Feeling bad or failure about yourself  1    Trouble concentrating 3    Moving slowly or fidgety/restless 1    Suicidal thoughts 0    PHQ-9 Score 10    Difficult doing work/chores Very difficult      History [name] has a past medical history of Arthritis, Atypical chest pain (03/19/2022), GERD (gastroesophageal reflux disease), Headache(784.0), Hepatitis, History of gout, Hypertension, Hypertensive encephalopathy, 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   She has a past surgical history that includes Abdominal hysterectomy; Anterior cervical corpectomy (03/07/2012); Tonsillectomy; Multiple tooth extractions; Cataract extraction w/PHACO (Left, 04/12/2017); and Cataract extraction w/PHACO (Right, 05/03/2017).   Her family history includes Asthma in her daughter; Bipolar disorder in her daughter, daughter, daughter, daughter, and son; Drug abuse in her daughter; Heart disease in her daughter and daughter; Hypertension in her daughter. She was adopted.She reports that she quit smoking about 8 months ago. Her smoking use included cigarettes. She started smoking about 62 years ago. She has a 30.9 pack-year smoking history. She has never used smokeless tobacco. She reports current alcohol  use. She reports that she does not use drugs.    ROS Review of Systems  Constitutional: Negative.   HENT: Negative.    Eyes:  Negative for visual disturbance.  Respiratory:  Negative for shortness of breath.   Cardiovascular:  Negative for chest pain.  Gastrointestinal:  Negative for abdominal pain.  Musculoskeletal:  Negative for arthralgias.    Objective:  BP (!) 167/61   Pulse 69   Temp (!) 97.4 F (36.3 C)   Ht 5' 1.75" (1.568 m)   Wt 140 lb 6.4 oz (63.7 kg)   SpO2 95%   BMI 25.89 kg/m   BP Readings from Last 3 Encounters:  12/19/22 (!) 167/61  12/11/22 (!) 196/84  12/07/22 (!) 169/90    Wt Readings from Last 3 Encounters:  12/19/22 140 lb 6.4 oz (63.7 kg)  12/11/22 136 lb 12.8 oz (62.1 kg)  12/07/22 130 lb (59 kg)     Physical Exam Constitutional:      General: She is not in acute distress.    Appearance: She is well-developed.  Cardiovascular:     Rate and Rhythm: Normal rate and regular rhythm.  Pulmonary:     Breath sounds: Normal breath sounds.  Musculoskeletal:        General: Normal range of motion.  Skin:    General: Skin is warm and dry.  Neurological:     Mental Status: She is alert and oriented to person, place, and time.       Assessment & Plan:   [name] was seen  today for medical management of chronic issues.  Diagnoses and all orders for this visit:  COPD with chronic bronchitis  Congestive heart failure, unspecified HF chronicity, unspecified heart failure type (HCC) -     For home use only DME Other see comment  Other orders -     clopidogrel (PLAVIX) 75 MG tablet; Take 1 tablet (75 mg total) by mouth daily.  Continue nebs, inhalers. Get scale to monitor weight. If gains 2+ pounds increase lasix that day. Hold if drops 2 lb     I have changed [name]'s clopidogrel. I am also having her maintain her atorvastatin, cyclobenzaprine, amLODipine, isosorbide dinitrate, cyanocobalamin, ezetimibe,  budesonide-formoterol, budesonide, arformoterol, albuterol, acetaminophen, Vitamin D (Ergocalciferol), potassium chloride SA, furosemide, metoprolol succinate, hydrALAZINE, and olmesartan.  Allergies as of 12/19/2022       Reactions   Penicillins Anaphylaxis, Swelling, Rash, Other (See Comments)   Has patient had a PCN reaction causing immediate rash, facial/tongue/throat swelling, SOB or lightheadedness with hypotension: yes Has patient had a PCN reaction causing severe rash involving mucus membranes or skin necrosis: no Has patient had a PCN reaction that required hospitalization: yes Has patient had a PCN reaction occurring within the last 10 years: no If all of the above answers are "NO", then may proceed with Cephalosporin use.        Medication List        Accurate as of December 19, 2022 10:03 AM. If you have any questions, ask your nurse or doctor.          acetaminophen 500 MG tablet Commonly known as: TYLENOL Take 2 tablets (1,000 mg total) by mouth 3 (three) times daily.   albuterol 108 (90 Base) MCG/ACT inhaler Commonly known as: VENTOLIN HFA Inhale 2 puffs into the lungs every 6 (six) hours as needed for wheezing or shortness of breath.   amLODipine 10 MG tablet Commonly known as: NORVASC Take 1 tablet (10 mg total) by mouth daily.   arformoterol 15 MCG/2ML Nebu Commonly known as: BROVANA Take 2 mLs (15 mcg total) by nebulization 2 (two) times daily.   atorvastatin 80 MG tablet Commonly known as: LIPITOR Take 1 tablet (80 mg total) by mouth daily.   budesonide 0.5 MG/2ML nebulizer solution Commonly known as: PULMICORT Take 2 mLs (0.5 mg total) by nebulization 2 (two) times daily.   budesonide-formoterol 160-4.5 MCG/ACT inhaler Commonly known as: SYMBICORT Inhale 2 puffs into the lungs 2 (two) times daily.   clopidogrel 75 MG tablet Commonly known as: PLAVIX Take 1 tablet (75 mg total) by mouth daily.   cyanocobalamin 1000 MCG tablet Take 1 tablet  (1,000 mcg total) by mouth daily.   cyclobenzaprine 5 MG tablet Commonly known as: FLEXERIL TAKE ONE TABLET THREE TIMES DAILY AS NEEDED FOR MUSCLE SPASMS What changed: See the new instructions.   ezetimibe 10 MG tablet Commonly known as: ZETIA Take 1 tablet (10 mg total) by mouth daily.   furosemide 40 MG tablet Commonly known as: LASIX Take 1 tablet (40 mg total) by mouth daily. For swelling   hydrALAZINE 50 MG tablet Commonly known as: APRESOLINE Take 1 tablet (50 mg total) by mouth 3 (three) times daily.   isosorbide dinitrate 20 MG tablet Commonly known as: ISORDIL Take 1 tablet (20 mg total) by mouth 3 (three) times daily.   metoprolol succinate 50 MG 24 hr tablet Commonly known as: TOPROL-XL Take 1 tablet (50 mg total) by mouth daily. For blood pressure control   olmesartan 40 MG  tablet Commonly known as: Benicar Take 1 tablet (40 mg total) by mouth daily. For blood pressure   potassium chloride SA 20 MEQ tablet Commonly known as: KLOR-CON M Take 1 tablet (20 mEq total) by mouth daily. For potassium replacement/ supplement   Vitamin D (Ergocalciferol) 1.25 MG (50000 UNIT) Caps capsule Commonly known as: DRISDOL Take 1 capsule (50,000 Units total) by mouth every 7 (seven) days.               Durable Medical Equipment  (From admission, onward)           Start     Ordered   12/19/22 0000  For home use only DME Other see comment       Comments: DX: I50.9 Scale for use to  measure daily body weight to control CHF  Question:  Length of Need  Answer:  Lifetime   12/19/22 0956             Follow-up: Return in about 1 month (around 01/19/2023).  Mechele Claude, M.D.

## 2022-12-24 ENCOUNTER — Ambulatory Visit: Payer: Self-pay | Admitting: *Deleted

## 2022-12-24 ENCOUNTER — Encounter: Payer: Self-pay | Admitting: *Deleted

## 2022-12-24 DIAGNOSIS — J4489 Other specified chronic obstructive pulmonary disease: Secondary | ICD-10-CM

## 2022-12-24 DIAGNOSIS — R634 Abnormal weight loss: Secondary | ICD-10-CM

## 2022-12-24 DIAGNOSIS — I739 Peripheral vascular disease, unspecified: Secondary | ICD-10-CM

## 2022-12-24 NOTE — Patient Instructions (Signed)
Visit Information  Thank you for taking time to visit with me today. Please don't hesitate to contact me if I can be of assistance to you.   Following are the goals we discussed today:   Goals Addressed               This Visit's Progress     Receive Assistance Obtaining Personal Care Services. (pt-stated)   On track     Care Coordination Interventions:  Interventions Today    Flowsheet Row Most Recent Value  Chronic Disease   Chronic disease during today's visit Hypertension (HTN), Chronic Obstructive Pulmonary Disease (COPD), Chronic Kidney Disease/End Stage Renal Disease (ESRD), Other  [Inability to Perform Activities of Daily Living Independently, Recurrent Depression, Cigarette Nicotine Dependence, Generalized Anxiety Disorder, & History of Transient Ischemic Attack]  General Interventions   General Interventions Discussed/Reviewed General Interventions Discussed, Labs, Vaccines, Doctor Visits, Communication with, Level of Care, [facility], Referral to Nurse, Annual Foot Exam, Health Screening, Lipid Profile, General Interventions Reviewed, Durable Medical Equipment (DME), Annual Eye Exam  [Encouraged]  Labs Hgb A1c every 3 months, Kidney Function  [Encouraged]  Vaccines COVID-19, Flu, Pneumonia, RSV, Shingles, Tetanus/Pertussis/Diphtheria  [Encouraged]  Doctor Visits Discussed/Reviewed Doctor Visits Discussed, Specialist, Doctor Visits Reviewed, Annual Wellness Visits, PCP  [Encouraged]  Health Screening Bone Density, Colonoscopy, Mammogram  [Encouraged]  Durable Medical Equipment (DME) BP Cuff, Walker, Other  [Eyeglasses, Cane]  PCP/Specialist Visits Compliance with follow-up visit  [Encouraged]  Communication with PCP/Specialists, RN  [Encouraged]  Level of Care Adult Daycare, Personal Care Services, Applications, Assisted Living, Skilled Nursing Facility  [Encouraged]  Applications Medicaid, Personal Care Services, [place]-2  [Encouraged]  Exercise Interventions    Exercise Discussed/Reviewed Exercise Discussed, Assistive device use and maintanence, Exercise Reviewed, Physical Activity, Weight Managment  [Encouraged]  Physical Activity Discussed/Reviewed Physical Activity Discussed, Home Exercise Program (HEP), Physical Activity Reviewed, Types of exercise  [Encouraged]  Weight Management Weight maintenance  [Encouraged]  Education Interventions   Education Provided Provided Therapist, sports, Provided Web-based Education, Provided Education  [Encouraged]  Provided Verbal Education On Nutrition, Mental Health/Coping with Illness, When to see the doctor, [employer], [employer], Medication, Blood Sugar Monitoring, Exercise, Applications, Labs, Eye Care, Foot Care  [Encouraged]  Labs Reviewed Hgb A1c  [Encouraged]  Applications Medicaid, Personal Care Services, FL-2  [Encouraged]  Mental Health Interventions   Mental Health Discussed/Reviewed Mental Health Discussed, Anxiety, Depression, Grief and Loss, Mental Health Reviewed, Coping Strategies, Substance Abuse, Suicide, Other, Crisis  [Domestic Violence]  Nutrition Interventions   Nutrition Discussed/Reviewed Nutrition Discussed, Adding fruits and vegetables, Nutrition Reviewed, Fluid intake, Increasing proteins, Decreasing fats, Decreasing salt, Decreasing sugar intake, Carbohydrate meal planning, Portion sizes, Supplemental nutrition  [Encouraged]  Pharmacy Interventions   Pharmacy Dicussed/Reviewed Pharmacy Topics Discussed, Medications and their functions, Medication Adherence, Pharmacy Topics Reviewed, Affording Medications  [Encouraged]  Medication Adherence --  [N/A]  Safety Interventions   Safety Discussed/Reviewed Safety Discussed, Safety Reviewed, Fall Risk, Home Safety  [Encouraged]  Home Safety Contact home health agency, Contact provider for referral to PT/OT, Assistive Devices, Refer for community resources, Refer for home visit, Need for home safety assessment  [Encouraged]   Advanced Directive Interventions   Advanced Directives Discussed/Reviewed Advanced Directives Discussed, Provided resource for acquiring and filling out documents  [Encouraged]      Assessed Social Determinant of Health Barriers. Discussed Plans for Ongoing Care Management Follow Up. Provided Careers information officer Information for Care Management Team Members. Screened for Signs & Symptoms of Depression, Related to Chronic Disease  State.  PHQ2 & PHQ9 Depression Screen Completed & Results Reviewed.  Suicidal Ideation & Homicidal Ideation Assessed - None Present.   Domestic Violence Assessed - None Present. Access to Weapons Assessed - None Present.   Active Listening & Reflection Utilized.  Verbalization of Feelings Encouraged.  Emotional Support Provided. Feelings of Caregiver Stress & Burnout Validated. Symptoms of Caregiver Fatigue Acknowledged. Caregiver Resources Reviewed. Caregiver Support Groups Mailed. Self-Enrollment in Caregiver Support Group of Interest Emphasized. Crisis Support Information, Agencies, Services, & Resources Discussed. Problem Solving Interventions Identified. Task-Centered Solutions Implemented.   Solution-Focused Strategies Developed. Acceptance & Commitment Therapy Introduced. Brief Cognitive Behavioral Therapy Initiated. Client-Centered Therapy Enacted. Reviewed Prescription Medications & Discussed Importance of Compliance. Quality of Sleep Assessed & Sleep Hygiene Techniques Promoted. Emphasized Importance of Quitting Smoking & Offered Smoking Cessation Classes, Services, Agencies, & Resources. Discussed Higher Level of Care Options (I.e Assisted Living, Extended Care, Intermediate Level Care, Etc.) & Encouraged Consideration. Reviewed Materials engineer through [employer] Dual Complete & [employer]. Verified No Long-Term Care Insurance Benefits, Secondary Insurance Policies, Plans, Coverage, Etc.  Confirmed Patient,  Nor Deceased Spouse Were Veterans, Making Her Ineligible to Apply for Aid & Attendance Benefits, Through [employer]. CSW Collaboration with Kristin Bruins, Medicaid Case Worker with [facility] Department of Social Services [phone]), to Confirm Approval & Receipt of [facility]. CSW Collaboration with Kristin Bruins, Medicaid Case Worker with [facility] Department of Social Services [phone]), to Confirm Approval & Receipt of Food Stamps. [id number] Placed to [facility] Guides to Request Assistance with Referral to Meals on Wheels, through Aging, Disability, & Transit Services of [place] [phone]). Requested Review of The Following List of [employer], [employer], Resources, Occupational psychologist, Mailed on 12/24/2022: ~ Adult Day Care Programs  ~ In-Home Care & Respite Agencies ~ Home Health Care Agencies ~ Respite Care Agencies & Facilities ~ Personal Care Services Application  ~ Theatre manager Providers Encouraged Solicitor with [employer], Nurse, adult, & Resources of Interest in [place], from List Provided, in An Effort to Obtain Care & Supervision in The Home [employer]. Encouraged Completion of Application for Personal Care Services, through [facility] [phone]), & Submission to Dr. Mechele Claude, Primary Care Provider with [facility] [phone]), for Review & Signature. Encouraged Oceanographer, through [employer] Dual Complete Psychologist, sport and exercise (# [phone]), & [employer] [phone]). Encouraged Contact with CSW (# [phone]), if You Have Questions, Need Assistance, or If Additional Social Work Needs Are Identified Between Now & Our Next Scheduled Follow-Up Outreach Call.      Our next appointment is by telephone on 01/03/2023 at 11:00  am.   Please call the care guide team at [phone] if you need to cancel or reschedule your appointment.   If you are experiencing a Mental Health or Behavioral Health Crisis or need someone to talk to, please call the Suicide and Crisis Lifeline: 988 call the [place] National Suicide Prevention Lifeline: 502-490-9411 or TTY: 646-421-1915 TTY 909-747-9403) to talk to a trained counselor call 1-800-273-TALK (toll free, 24 hour hotline) go to [facility] [address] [place] [phone]) call the [facility] Crisis Line: [phone] call 911  Patient verbalizes understanding of instructions and care plan provided today and agrees to view in MyChart. Active MyChart status and patient understanding of how to access instructions and care plan via MyChart confirmed  with patient.     Telephone follow up appointment with care management team member scheduled for:  01/03/2023 at 11:00 am.   [name], BSW, MSW, LCSW  Licensed Clinical Social Worker  [facility]  Mailing [place]. [address] [place], [place] [address] Physical Address-[address] [place], [place] [address] Toll Free Main # [phone] Fax # [phone] Cell # [phone] [email or web address]

## 2022-12-24 NOTE — Patient Outreach (Signed)
Care Coordination   Initial Visit Note   12/24/2022  Name: [name] MRN: [id number] DOB: 09/29/1940  RIMAS ABDO is a 82 y.o. year old female who sees Stacks, Broadus John, MD for primary care. I spoke with [name] by phone today.  What matters to the patients health and wellness today?  Receive Assistance Obtaining Personal Care Services.   Goals Addressed               This Visit's Progress     Receive Assistance Obtaining Personal Care Services. (pt-stated)   On track     Care Coordination Interventions:  Interventions Today    Flowsheet Row Most Recent Value  Chronic Disease   Chronic disease during today's visit Hypertension (HTN), Chronic Obstructive Pulmonary Disease (COPD), Chronic Kidney Disease/End Stage Renal Disease (ESRD), Other  [Inability to Perform Activities of Daily Living Independently, Recurrent Depression, Cigarette Nicotine Dependence, Generalized Anxiety Disorder, & History of Transient Ischemic Attack]  General Interventions   General Interventions Discussed/Reviewed General Interventions Discussed, Labs, Vaccines, Doctor Visits, Communication with, Level of Care, [facility], Referral to Nurse, Annual Foot Exam, Health Screening, Lipid Profile, General Interventions Reviewed, Durable Medical Equipment (DME), Annual Eye Exam  [Encouraged]  Labs Hgb A1c every 3 months, Kidney Function  [Encouraged]  Vaccines COVID-19, Flu, Pneumonia, RSV, Shingles, Tetanus/Pertussis/Diphtheria  [Encouraged]  Doctor Visits Discussed/Reviewed Doctor Visits Discussed, Specialist, Doctor Visits Reviewed, Annual Wellness Visits, PCP  [Encouraged]  Health Screening Bone Density, Colonoscopy, Mammogram  [Encouraged]  Durable Medical Equipment (DME) BP Cuff, Walker, Other  [Eyeglasses, Cane]  PCP/Specialist Visits Compliance with follow-up visit  [Encouraged]  Communication with PCP/Specialists, RN  [Encouraged]  Level of Care Adult Daycare, Personal Care  Services, Applications, Assisted Living, Skilled Nursing Facility  [Encouraged]  Applications Medicaid, Personal Care Services, [place]-2  [Encouraged]  Exercise Interventions   Exercise Discussed/Reviewed Exercise Discussed, Assistive device use and maintanence, Exercise Reviewed, Physical Activity, Weight Managment  [Encouraged]  Physical Activity Discussed/Reviewed Physical Activity Discussed, Home Exercise Program (HEP), Physical Activity Reviewed, Types of exercise  [Encouraged]  Weight Management Weight maintenance  [Encouraged]  Education Interventions   Education Provided Provided Therapist, sports, Provided Web-based Education, Provided Education  [Encouraged]  Provided Verbal Education On Nutrition, Mental Health/Coping with Illness, When to see the doctor, [facility], [employer], Medication, Blood Sugar Monitoring, Exercise, Applications, Labs, Eye Care, Foot Care  [Encouraged]  Labs Reviewed Hgb A1c  [Encouraged]  Applications Medicaid, Personal Care Services, FL-2  [Encouraged]  Mental Health Interventions   Mental Health Discussed/Reviewed Mental Health Discussed, Anxiety, Depression, Grief and Loss, Mental Health Reviewed, Coping Strategies, Substance Abuse, Suicide, Other, Crisis  [Domestic Violence]  Nutrition Interventions   Nutrition Discussed/Reviewed Nutrition Discussed, Adding fruits and vegetables, Nutrition Reviewed, Fluid intake, Increasing proteins, Decreasing fats, Decreasing salt, Decreasing sugar intake, Carbohydrate meal planning, Portion sizes, Supplemental nutrition  [Encouraged]  Pharmacy Interventions   Pharmacy Dicussed/Reviewed Pharmacy Topics Discussed, Medications and their functions, Medication Adherence, Pharmacy Topics Reviewed, Affording Medications  [Encouraged]  Medication Adherence --  [N/A]  Safety Interventions   Safety Discussed/Reviewed Safety Discussed, Safety Reviewed, Fall Risk, Home Safety  [Encouraged]  Home Safety Contact home health  agency, Contact provider for referral to PT/OT, Assistive Devices, Refer for community resources, Refer for home visit, Need for home safety assessment  [Encouraged]  Advanced Directive Interventions   Advanced Directives Discussed/Reviewed Advanced Directives Discussed, Provided resource for acquiring and filling out documents  [Encouraged]      Assessed Social Determinant of Health Barriers.  Discussed Plans for Ongoing Care Management Follow Up. Provided Careers information officer Information for Care Management Team Members. Screened for Signs & Symptoms of Depression, Related to Chronic Disease State.  PHQ2 & PHQ9 Depression Screen Completed & Results Reviewed.  Suicidal Ideation & Homicidal Ideation Assessed - None Present.   Domestic Violence Assessed - None Present. Access to Weapons Assessed - None Present.   Active Listening & Reflection Utilized.  Verbalization of Feelings Encouraged.  Emotional Support Provided. Feelings of Caregiver Stress & Burnout Validated. Symptoms of Caregiver Fatigue Acknowledged. Caregiver Resources Reviewed. Caregiver Support Groups Mailed. Self-Enrollment in Caregiver Support Group of Interest Emphasized. Crisis Support Information, Agencies, Services, & Resources Discussed. Problem Solving Interventions Identified. Task-Centered Solutions Implemented.   Solution-Focused Strategies Developed. Acceptance & Commitment Therapy Introduced. Brief Cognitive Behavioral Therapy Initiated. Client-Centered Therapy Enacted. Reviewed Prescription Medications & Discussed Importance of Compliance. Quality of Sleep Assessed & Sleep Hygiene Techniques Promoted. Emphasized Importance of Quitting Smoking & Offered Smoking Cessation Classes, Services, Agencies, & Resources. Discussed Higher Level of Care Options (I.e Assisted Living, Extended Care, Intermediate Level Care, Etc.) & Encouraged Consideration. Reviewed Materials engineer through [employer]  Dual Complete & [employer]. Verified No Long-Term Care Insurance Benefits, Secondary Insurance Policies, Plans, Coverage, Etc.  Confirmed Patient, Nor Deceased Spouse Were Veterans, Making Her Ineligible to Apply for Aid & Attendance Benefits, Through [employer]. CSW Collaboration with Kristin Bruins, Medicaid Case Worker with [facility] Department of Social Services [phone]), to Confirm Approval & Receipt of [facility]. CSW Collaboration with Kristin Bruins, Medicaid Case Worker with [facility] Department of Social Services [phone]), to Confirm Approval & Receipt of Food Stamps. [id number] Placed to [facility] Guides to Request Assistance with Referral to Meals on Wheels, through Aging, Disability, & Transit Services of [place] [phone]). Requested Review of The Following List of [employer], [employer], Resources, Occupational psychologist, Mailed on 12/24/2022: ~ Adult Day Care Programs  ~ In-Home Care & Respite Agencies ~ Home Health Care Agencies ~ Respite Care Agencies & Facilities ~ Personal Care Services Application  ~ Theatre manager Providers Encouraged Solicitor with [employer], Nurse, adult, & Resources of Interest in [place], from List Provided, in An Effort to Obtain Care & Supervision in The Home [employer]. Encouraged Completion of Application for Personal Care Services, through [facility] [phone]), & Submission to Dr. Mechele Claude, Primary Care Provider with [facility] [phone]), for Review & Signature. Encouraged Oceanographer, through [employer] Dual Complete Psychologist, sport and exercise (# [phone]), & [employer] [phone]). Encouraged Contact with CSW (# [phone]), if You Have Questions, Need Assistance, or If  Additional Social Work Needs Are Identified Between Now & Our Next Scheduled Follow-Up Outreach Call.        SDOH assessments and interventions completed:  Yes.  SDOH Interventions Today    Flowsheet Row Most Recent Value  SDOH Interventions   Food Insecurity Interventions Other (Comment)  [Receives Food Stamps & Interested in Referral for Meals on Wheels]  Housing Interventions Intervention Not Indicated  Transportation Interventions Intervention Not Indicated, Patient Resources (Friends/Family), Payor Benefit, Other (Comment)  [United [employer] Medicare Dual Complete Psychologist, sport and exercise & Medicaid Transportation]  Utilities Interventions Intervention Not Indicated  Alcohol Usage Interventions Intervention Not Indicated (Score <7)  Depression Interventions/Treatment  Referral to Psychiatry, Medication, Counseling, Currently on Treatment  Financial Strain Interventions Intervention Not Indicated  Physical  Activity Interventions Patient Declined  Stress Interventions Intervention Not Indicated, Offered [employer], Provide Counseling  Social Connections Interventions Intervention Not Indicated  Health Literacy Interventions Intervention Not Indicated     Care Coordination Interventions:  Yes, provided.   Follow up plan: Follow up call scheduled for 01/03/2023 at 11:00 am.   Encounter Outcome:  Pt. Visit Completed.   [name], BSW, MSW, LCSW  Licensed Restaurant manager, [facility]  Mailing [place] [address], [place], [place] [address] Physical Address-[address] [place], [place] [address] Toll Free Main # [phone] Fax # [phone] Cell # [phone] [email or web address]

## 2022-12-26 DIAGNOSIS — J441 Chronic obstructive pulmonary disease with (acute) exacerbation: Secondary | ICD-10-CM | POA: Diagnosis not present

## 2022-12-26 DIAGNOSIS — M109 Gout, unspecified: Secondary | ICD-10-CM | POA: Diagnosis not present

## 2022-12-26 DIAGNOSIS — I509 Heart failure, unspecified: Secondary | ICD-10-CM

## 2022-12-26 DIAGNOSIS — Z7902 Long term (current) use of antithrombotics/antiplatelets: Secondary | ICD-10-CM | POA: Diagnosis not present

## 2022-12-26 DIAGNOSIS — Z8673 Personal history of transient ischemic attack (TIA), and cerebral infarction without residual deficits: Secondary | ICD-10-CM | POA: Diagnosis not present

## 2022-12-26 DIAGNOSIS — M1990 Unspecified osteoarthritis, unspecified site: Secondary | ICD-10-CM | POA: Diagnosis not present

## 2022-12-26 DIAGNOSIS — K759 Inflammatory liver disease, unspecified: Secondary | ICD-10-CM | POA: Diagnosis not present

## 2022-12-26 DIAGNOSIS — K219 Gastro-esophageal reflux disease without esophagitis: Secondary | ICD-10-CM | POA: Diagnosis not present

## 2022-12-26 DIAGNOSIS — Q824 Ectodermal dysplasia (anhidrotic): Secondary | ICD-10-CM | POA: Diagnosis not present

## 2022-12-26 DIAGNOSIS — I639 Cerebral infarction, unspecified: Secondary | ICD-10-CM | POA: Diagnosis not present

## 2022-12-26 DIAGNOSIS — Z9181 History of falling: Secondary | ICD-10-CM | POA: Diagnosis not present

## 2022-12-26 DIAGNOSIS — Z7951 Long term (current) use of inhaled steroids: Secondary | ICD-10-CM | POA: Diagnosis not present

## 2022-12-26 DIAGNOSIS — Z87891 Personal history of nicotine dependence: Secondary | ICD-10-CM | POA: Diagnosis not present

## 2022-12-26 DIAGNOSIS — I110 Hypertensive heart disease with heart failure: Secondary | ICD-10-CM | POA: Diagnosis not present

## 2022-12-26 DIAGNOSIS — J4489 Other specified chronic obstructive pulmonary disease: Secondary | ICD-10-CM | POA: Diagnosis not present

## 2022-12-26 DIAGNOSIS — R634 Abnormal weight loss: Secondary | ICD-10-CM | POA: Diagnosis not present

## 2022-12-26 DIAGNOSIS — Z7982 Long term (current) use of aspirin: Secondary | ICD-10-CM | POA: Diagnosis not present

## 2022-12-27 DIAGNOSIS — Q824 Ectodermal dysplasia (anhidrotic): Secondary | ICD-10-CM | POA: Diagnosis not present

## 2022-12-27 DIAGNOSIS — Z8673 Personal history of transient ischemic attack (TIA), and cerebral infarction without residual deficits: Secondary | ICD-10-CM | POA: Diagnosis not present

## 2022-12-27 DIAGNOSIS — Z7902 Long term (current) use of antithrombotics/antiplatelets: Secondary | ICD-10-CM | POA: Diagnosis not present

## 2022-12-27 DIAGNOSIS — Z7951 Long term (current) use of inhaled steroids: Secondary | ICD-10-CM | POA: Diagnosis not present

## 2022-12-27 DIAGNOSIS — I509 Heart failure, unspecified: Secondary | ICD-10-CM | POA: Diagnosis not present

## 2022-12-27 DIAGNOSIS — M109 Gout, unspecified: Secondary | ICD-10-CM | POA: Diagnosis not present

## 2022-12-27 DIAGNOSIS — R634 Abnormal weight loss: Secondary | ICD-10-CM | POA: Diagnosis not present

## 2022-12-27 DIAGNOSIS — Z87891 Personal history of nicotine dependence: Secondary | ICD-10-CM | POA: Diagnosis not present

## 2022-12-27 DIAGNOSIS — I110 Hypertensive heart disease with heart failure: Secondary | ICD-10-CM | POA: Diagnosis not present

## 2022-12-27 DIAGNOSIS — M1990 Unspecified osteoarthritis, unspecified site: Secondary | ICD-10-CM | POA: Diagnosis not present

## 2022-12-27 DIAGNOSIS — J4489 Other specified chronic obstructive pulmonary disease: Secondary | ICD-10-CM | POA: Diagnosis not present

## 2022-12-27 DIAGNOSIS — K219 Gastro-esophageal reflux disease without esophagitis: Secondary | ICD-10-CM | POA: Diagnosis not present

## 2022-12-27 DIAGNOSIS — K759 Inflammatory liver disease, unspecified: Secondary | ICD-10-CM | POA: Diagnosis not present

## 2022-12-27 DIAGNOSIS — Z7982 Long term (current) use of aspirin: Secondary | ICD-10-CM | POA: Diagnosis not present

## 2022-12-27 DIAGNOSIS — Z9181 History of falling: Secondary | ICD-10-CM | POA: Diagnosis not present

## 2022-12-28 ENCOUNTER — Telehealth: Payer: Self-pay

## 2022-12-28 NOTE — Telephone Encounter (Signed)
   Telephone encounter was:  Successful.  12/28/2022 Name: DRESDEN AMENT MRN: [id number] DOB: 02-04-1941  YUVAL NOLET is a 82 y.o. year old female who is a primary care patient of Stacks, Broadus John, MD . The community resource team was consulted for assistance with  Meals on wheels   Care guide performed the following interventions: Patient provided with information about care guide support team and interviewed to confirm resource needs.Patient stated she wanted to be added to the meals on wheels waitin list.   Follow Up Plan:  Care guide will follow up with patient by phone over the next week    [facility] Guide, [facility] [phone] [address] [place], [place], [place] [address] Phone: [phone] Email: [email or web address]

## 2022-12-31 ENCOUNTER — Ambulatory Visit (HOSPITAL_BASED_OUTPATIENT_CLINIC_OR_DEPARTMENT_OTHER): Payer: 59 | Admitting: Family

## 2022-12-31 ENCOUNTER — Encounter (HOSPITAL_BASED_OUTPATIENT_CLINIC_OR_DEPARTMENT_OTHER): Payer: Self-pay | Admitting: Family

## 2022-12-31 ENCOUNTER — Other Ambulatory Visit (INDEPENDENT_AMBULATORY_CARE_PROVIDER_SITE_OTHER): Payer: 59

## 2022-12-31 VITALS — BP 130/68 | HR 71 | Ht 61.75 in | Wt 139.0 lb

## 2022-12-31 DIAGNOSIS — I739 Peripheral vascular disease, unspecified: Secondary | ICD-10-CM

## 2022-12-31 DIAGNOSIS — R002 Palpitations: Secondary | ICD-10-CM

## 2022-12-31 DIAGNOSIS — J4489 Other specified chronic obstructive pulmonary disease: Secondary | ICD-10-CM

## 2022-12-31 DIAGNOSIS — K219 Gastro-esophageal reflux disease without esophagitis: Secondary | ICD-10-CM | POA: Diagnosis not present

## 2022-12-31 DIAGNOSIS — Z8673 Personal history of transient ischemic attack (TIA), and cerebral infarction without residual deficits: Secondary | ICD-10-CM

## 2022-12-31 DIAGNOSIS — I25118 Atherosclerotic heart disease of native coronary artery with other forms of angina pectoris: Secondary | ICD-10-CM

## 2022-12-31 DIAGNOSIS — Z7902 Long term (current) use of antithrombotics/antiplatelets: Secondary | ICD-10-CM | POA: Diagnosis not present

## 2022-12-31 DIAGNOSIS — M109 Gout, unspecified: Secondary | ICD-10-CM | POA: Diagnosis not present

## 2022-12-31 DIAGNOSIS — Z87891 Personal history of nicotine dependence: Secondary | ICD-10-CM | POA: Diagnosis not present

## 2022-12-31 DIAGNOSIS — Z7982 Long term (current) use of aspirin: Secondary | ICD-10-CM | POA: Diagnosis not present

## 2022-12-31 DIAGNOSIS — Q824 Ectodermal dysplasia (anhidrotic): Secondary | ICD-10-CM | POA: Diagnosis not present

## 2022-12-31 DIAGNOSIS — Z7951 Long term (current) use of inhaled steroids: Secondary | ICD-10-CM | POA: Diagnosis not present

## 2022-12-31 DIAGNOSIS — Z9181 History of falling: Secondary | ICD-10-CM | POA: Diagnosis not present

## 2022-12-31 DIAGNOSIS — R634 Abnormal weight loss: Secondary | ICD-10-CM | POA: Diagnosis not present

## 2022-12-31 DIAGNOSIS — E785 Hyperlipidemia, unspecified: Secondary | ICD-10-CM

## 2022-12-31 DIAGNOSIS — I509 Heart failure, unspecified: Secondary | ICD-10-CM | POA: Diagnosis not present

## 2022-12-31 DIAGNOSIS — K759 Inflammatory liver disease, unspecified: Secondary | ICD-10-CM | POA: Diagnosis not present

## 2022-12-31 DIAGNOSIS — I110 Hypertensive heart disease with heart failure: Secondary | ICD-10-CM | POA: Diagnosis not present

## 2022-12-31 DIAGNOSIS — M1990 Unspecified osteoarthritis, unspecified site: Secondary | ICD-10-CM | POA: Diagnosis not present

## 2022-12-31 LAB — LIPID PANEL
Chol/HDL Ratio: 2.6 ratio (ref 0.0–4.4)
Cholesterol, Total: 196 mg/dL (ref 100–199)
HDL: 75 mg/dL (ref >39)
LDL Chol Calc (NIH): 109 mg/dL — ABNORMAL HIGH (ref 0–99)
Triglycerides: 63 mg/dL (ref 0–149)
VLDL Cholesterol Cal: 12 mg/dL (ref 5–40)

## 2022-12-31 NOTE — Progress Notes (Signed)
Cardiology Office Note:  .   Date:  12/31/2022  ID:  ANNEL PEREZMARTINEZ, DOB 06/22/1940, MRN [id number] PCP: Mechele Claude, MD  [facility] Providers Cardiologist:  Chilton Si, MD    History of Present Illness: [name]   CLAUDELL LINQUIST is a 82 y.o. female  with a hx of CAD, hyperlipidemia, penetrating aortic ulcer, CVA, COPD, hypertension, CKD 3A, GERD.   Established with Dr. Duke Salvia of 03/19/2022 due to atypical chest pain.  Cardiac CTA ordered and performed 03/29/2022 calcium score 396 placing her in the 85th percentile for age/sex/race matched control.  Moderate nonobstructive disease with no significant lesion by FFR.   Admitted 10/29 - 03/27/2022 after presenting with COPD exacerbation as well as AKI.  Her ARB and HCTZ were held.  She was treated with antibiotics, bronchodilators/nebulizer.  Subsequently established with pulmonology.   When seen 04/17/2022 hydralazine 25 mg twice daily initiated as BP not at goal of less than 130/80.  At visit with pharmacy team 06/12/2022 was increased to 50 mg TID.  At visit 07/04/2022 hydrochlorothiazide 12.5 mg was added to her valsartan/HCTZ 160/12.5 mg QD.   Admitted 4/21 - 09/20/2022 with chest discomfort, hypertensive urgency.  Blood pressure initially up to 237.  Started on nicardipine drip.  CTA chest/abdomen/pelvis showed partially calcified atherosclerotic plaque with focal stenosis of the left renal artery with moderate atrophy of the left kidney.  Evaluated by [facility] Dr. Chestine Spore with good celiac trunk and SMA flow recommended to follow-up regarding renal stenosis in outpatient setting.  Valsartan and hydrochlorothiazide were discontinued due to renal function.  She was discharged on  amlodipine 10 mg daily, hydralazine 50 mg TID, isosorbide dinitrate 20 mg TID,  bisoprolol 5 mg daily.   Last seen 10/15/22 doing well, walking for exercise with her dog. As LDL not at goal, Zetia was added.   ED visit 12/07/22 with elevated BP which improved to 130  systolic without intervention.  CXR showed interstitial edema and she was given dose of Lasix. New age indeterminate infarct in left banglia CVA.  No focal neurological deficits noted.  PCP has since ordered MRI brain which has not yet been performed.  She was referred to home health.  Presents today for follow-up. She has HH PT for strengthening, conditioning and encouraged to participate.  We discussed that we each need a little bit of additional help sometimes and hopeful these resources will help improve her independence.  Continues to walk with her dogs for exercise.  BP has been well-controlled at home.  Reports no chest pain, orthopnea, edema, PND.  Stable exertional dyspnea.  Does note episodes of palpitations but no lightheadedness, dizziness.  ROS: Please see the history of present illness.    All other systems reviewed and are negative.   Studies Reviewed: .        Cardiac Studies & Procedures       ECHOCARDIOGRAM  ECHOCARDIOGRAM COMPLETE 09/17/2022  Narrative ECHOCARDIOGRAM REPORT    Patient Name:   ONELLA PRIOLEAU Date of Exam: 09/17/2022 Medical Rec #:  [id number]        Height:       61.0 in Accession #:    [id number]       Weight:       132.7 lb Date of Birth:  [birth year]         BSA:          1.587 m Patient Age:    15 years  BP:           154/55 mmHg Patient Gender: F                HR:           78 bpm. Ex[name]  Procedure: 2D Echo, Cardiac Doppler and Color Doppler  Indications:    Chest Pain R07.9  History:        Patient has prior history of Echocardiogram examinations, most recent 02/12/2012. COPD, TIA and Stroke; Risk Factors:Hypertension and Dyslipidemia. Hx of Tobacco abuse.  Sonographer:    Celesta Gentile RCS Referring Phys: [phone] DAVID TAT  IMPRESSIONS   1. Left ventricular ejection fraction, by estimation, is >75%. The left ventricle has hyperdynamic function. The left ventricle has no regional wall motion abnormalities. Left  ventricular diastolic parameters are consistent with Grade I diastolic dysfunction (impaired relaxation). 2. Right ventricular systolic function is normal. The right ventricular size is normal. Tricuspid regurgitation signal is inadequate for assessing PA pressure. 3. The mitral valve is normal in structure. No evidence of mitral valve regurgitation. No evidence of mitral stenosis. 4. The aortic valve was not well visualized. Aortic valve regurgitation is not visualized. No aortic stenosis is present. 5. The inferior vena cava is normal in size with greater than 50% respiratory variability, suggesting right atrial pressure of 3 mmHg.  Comparison(s): No prior Echocardiogram.  FINDINGS Left Ventricle: Left ventricular ejection fraction, by estimation, is >75%. The left ventricle has hyperdynamic function. The left ventricle has no regional wall motion abnormalities. The left ventricular internal cavity size was normal in size. There is no left ventricular hypertrophy. Left ventricular diastolic parameters are consistent with Grade I diastolic dysfunction (impaired relaxation).  Right Ventricle: The right ventricular size is normal. No increase in right ventricular wall thickness. Right ventricular systolic function is normal. Tricuspid regurgitation signal is inadequate for assessing PA pressure.  Left Atrium: Left atrial size was normal in size.  Right Atrium: Right atrial size was normal in size.  Pericardium: There is no evidence of pericardial effusion.  Mitral Valve: The mitral valve is normal in structure. There is mild thickening of the mitral valve leaflet(s). There is mild calcification of the mitral valve leaflet(s). No evidence of mitral valve regurgitation. No evidence of mitral valve stenosis.  Tricuspid Valve: The tricuspid valve is normal in structure. Tricuspid valve regurgitation is not demonstrated. No evidence of tricuspid stenosis.  Aortic Valve: The aortic valve was not  well visualized. Aortic valve regurgitation is not visualized. No aortic stenosis is present.  Pulmonic Valve: The pulmonic valve was not well visualized. Pulmonic valve regurgitation is not visualized. No evidence of pulmonic stenosis.  Aorta: The aortic root is normal in size and structure.  Venous: The inferior vena cava is normal in size with greater than 50% respiratory variability, suggesting right atrial pressure of 3 mmHg.  IAS/Shunts: No atrial level shunt detected by color flow Doppler.   LEFT VENTRICLE PLAX 2D LVIDd:         4.10 cm   Diastology LVIDs:         2.00 cm   LV e' medial:    6.74 cm/s LV PW:         1.00 cm   LV E/e' medial:  8.7 LV IVS:        1.00 cm   LV e' lateral:   7.40 cm/s LVOT diam:     1.80 cm   LV E/e' lateral: 7.9 LV SV:  67 LV SV Index:   43 LVOT Area:     2.54 cm   RIGHT VENTRICLE RV S prime:     12.80 cm/s TAPSE (M-mode): 2.5 cm  LEFT ATRIUM             Index        RIGHT ATRIUM           Index LA diam:        3.10 cm 1.95 cm/m   RA Area:     15.50 cm LA Vol (A2C):   50.7 ml 31.95 ml/m  RA Volume:   42.00 ml  26.47 ml/m LA Vol (A4C):   48.6 ml 30.63 ml/m LA Biplane Vol: 50.0 ml 31.51 ml/m AORTIC VALVE LVOT Vmax:   130.00 cm/s LVOT Vmean:  93.200 cm/s LVOT VTI:    0.265 m  AORTA Ao Root diam: 2.80 cm  MITRAL VALVE MV Area (PHT): 2.95 cm    SHUNTS MV Decel Time: 257 msec    Systemic VTI:  0.26 m MV E velocity: 58.70 cm/s  Systemic Diam: 1.80 cm MV A velocity: 89.40 cm/s MV E/A ratio:  0.66  Vishnu Priya Mallipeddi Electronically signed by Winfield Rast Mallipeddi Signature Date/Time: 09/17/2022/2:50:27 PM    Final     CT SCANS  CT CORONARY MORPH W/CTA COR W/SCORE 03/30/2022  Addendum 03/30/2022  1:27 PM ADDENDUM REPORT: 03/30/2022 13:25  EXAM: OVER-READ INTERPRETATION  CT CHEST  The following report is an over-read performed by radiologist Dr. Duanne Guess of [facility] on 03/30/2022.  This over-read does not include interpretation of cardiac or coronary anatomy or pathology. The coronary CTA interpretation by the cardiologist is attached.  COMPARISON:  05/15/2021  FINDINGS: Normal heart size. No pericardial effusion. Image thoracic aorta is nonaneurysmal. Central pulmonary vasculature within normal limits.  No adenopathy within the included chest. Imaged lung fields are clear. Mild centrilobular emphysema. Mild bronchial wall thickening. No acute bony or chest wall abnormality.  IMPRESSION: Mild centrilobular emphysema and mild bronchial wall thickening.  Emphysema (ICD10-J43.9).   Electronically Signed By: Duanne Guess D.O. On: 03/30/2022 13:25  Narrative CLINICAL DATA:  Chest pain  EXAM: Cardiac/Coronary CTA  TECHNIQUE: A non-contrast, gated CT scan was obtained with axial slices of 3 mm through the heart for calcium scoring. Calcium scoring was performed using the Agatston method. A 120 kV prospective, gated, contrast cardiac scan was obtained. Gantry rotation speed was 250 msecs and collimation was 0.6 mm. Two sublingual nitroglycerin tablets (0.8 mg) were given. The 3D data set was reconstructed in 5% intervals of the 35-75% of the R-R cycle. Diastolic phases were analyzed on a dedicated workstation using MPR, MIP, and VRT modes. The patient received 95 cc of contrast.  FINDINGS: Image quality: Fair due to significant slab artifact.  Noise artifact is: Limited.  Coronary Arteries:  Normal coronary origin.  Right dominance.  Left main: The left main is a large caliber vessel with a normal take off from the left coronary cusp that bifurcates to form a left anterior descending artery and a left circumflex artery. There is no plaque or stenosis.  Left anterior descending artery: The LAD gives off 1 patent diagonal branch. There is mild calcified plaque in the proximal, mid and distal LAD with associated stenosis of 25-49%. There is  moderate mixed plaque in the mid D1 with associated stenosis of 50-69%.  Left circumflex artery: The LCX is non-dominant and gives rise to 3 OM branches. There is mild calcified plaque  in the proximal LCx with associated stenosis of 25-49%.  Right coronary artery: The RCA is dominant with normal take off from the right coronary cusp. The vessel is diffusely diseased in the proximal and mid portion with mixed and calcified plaque. There is mild to moderate calcified plaque in the proximal RCA with associated stenosis of 25-49% but could be >50%. There is mild calcified plaque in the distal RCA with associated stenosis of 25-49%. the RCA terminates as a PDA and right posterolateral branch without evidence of plaque or stenosis.  Right Atrium: Right atrial size is within normal limits.  Right Ventricle: The right ventricular cavity is within normal limits.  Left Atrium: Left atrial size is normal in size with no left atrial appendage filling defect.  Left Ventricle: The ventricular cavity size is within normal limits. There are no stigmata of prior infarction. There is no abnormal filling defect.  Pulmonary arteries: Normal in size without proximal filling defect.  Pulmonary veins: Normal pulmonary venous drainage.  Pericardium: Normal thickness with no significant effusion or calcium present.  Cardiac valves: The aortic valve is trileaflet without significant calcification. The mitral valve is normal structure without significant calcification.  Aorta: Normal caliber with scattered calcifications.  Extra-cardiac findings: See attached radiology report for non-cardiac structures.  IMPRESSION: 1. Coronary calcium score of 396. This was 85th percentile for age-, sex, and race-matched controls.  2.  Normal coronary origin with right dominance.  3.  Moderate atherosclerosis.  CAD RADS 3.  4. Consider symptom-guided anti-ischemic pharmacotherapy and risk factor  modification per guideline-directed medical therapy.  5.  Study has been submitted for FFR analysis.  RECOMMENDATIONS: 1. CAD-RADS 0: No evidence of CAD (0%). Consider non-atherosclerotic causes of chest pain.  2. CAD-RADS 1: Minimal non-obstructive CAD (0-24%). Consider non-atherosclerotic causes of chest pain. Consider preventive therapy and risk factor modification.  3. CAD-RADS 2: Mild non-obstructive CAD (25-49%). Consider non-atherosclerotic causes of chest pain. Consider preventive therapy and risk factor modification.  4. CAD-RADS 3: Moderate stenosis. Consider symptom-guided anti-ischemic pharmacotherapy as well as risk factor modification per guideline directed care. Additional analysis with CT FFR will be submitted.  5. CAD-RADS 4: Severe stenosis. (70-99% or > 50% left main). Cardiac catheterization or CT FFR is recommended. Consider symptom-guided anti-ischemic pharmacotherapy as well as risk factor modification per guideline directed care. Invasive coronary angiography recommended with revascularization per published guideline statements.  6. CAD-RADS 5: Total coronary occlusion (100%). Consider cardiac catheterization or viability assessment. Consider symptom-guided anti-ischemic pharmacotherapy as well as risk factor modification per guideline directed care.  7. CAD-RADS N: Non-diagnostic study. Obstructive CAD can't be excluded. Alternative evaluation is recommended.  Armanda Magic, MD  Electronically Signed: By: Armanda Magic M.D. On: 03/30/2022 11:45          Risk Assessment/Calculations:             Physical Exam:   VS:  BP 130/68   Pulse 71   Ht 5' 1.75" (1.568 m)   Wt 139 lb (63 kg)   BMI 25.63 kg/m    Wt Readings from Last 3 Encounters:  12/31/22 139 lb (63 kg)  12/19/22 140 lb 6.4 oz (63.7 kg)  12/11/22 136 lb 12.8 oz (62.1 kg)    GEN: Well nourished, well developed in no acute distress NECK: No JVD; No carotid bruits CARDIAC: RRR,  no murmurs, rubs, gallops RESPIRATORY:  Clear to auscultation without rales, wheezing or rhonchi  ABDOMEN: Soft, non-tender, non-distended EXTREMITIES:  No edema; No deformity  ASSESSMENT AND PLAN: .    CAD/hyperlipidemia-cardiac CTA 03/29/2022 with coronary calcium score of 396 placing in the 85th percentile with moderate nonobstructive coronary artery disease by FFR.  No symptoms concerning for angina. Continue GDMT Plavix, atorvastatin 80mg  QD, Isosorbide dinitrate 20mg  TID, Bisoprolol 5mg  QD.  Cardioselective beta-blocker due to COPD. Heart healthy diet and regular cardiovascular exercise encouraged.   03/2022 LDL 81. On Atorvastatin, Zetia. Update lipid panel today.    HTN - BP well controlled. Continue current antihypertensive regimen.  If needed in future could increased Hydralazine or Bisoprolol dose.    PAD / RAS - Follows with Dr. Chestine Spore.    Hx of CVA /palpitations- Continue plavix, atorvastatin, zetia.  Due to palpitations and new finding of age-indeterminate infarct in left basal ganglia by CT 7/0/24-plan for 14-day ZIO to rule out atrial fibrillation.   CKD- Careful titration of diuretic and antihypertensive.     GERD- Continue to follow with PCP.    COPD/tobacco use- Follows with pulmonology. No signs of acute exacerbation. Congratulated on continued cessation. Quit smoking 8 months ago.        Dispo: follow up in 4-6 months  Signed, Alver Sorrow, NP

## 2022-12-31 NOTE — Patient Instructions (Addendum)
Medication Instructions:  Continue your current medications.   *If you need a refill on your cardiac medications before your next appointment, please call your pharmacy*   Lab Work: Your physician recommends that you return for lab work today: lipid panel  If you have labs (blood work) drawn today and your tests are completely normal, you will receive your results only by: MyChart Message (if you have MyChart) OR A paper copy in the mail If you have any lab test that is abnormal or we need to change your treatment, we will call you to review the results.   Testing/Procedures: Your physician has recommended that you wear a Zio monitor.   This monitor is a medical device that records the heart's electrical activity. Doctors most often use these monitors to diagnose arrhythmias. Arrhythmias are problems with the speed or rhythm of the heartbeat. The monitor is a small device applied to your chest. You can wear one while you do your normal daily activities. While wearing this monitor if you have any symptoms to push the button and record what you felt. Once you have worn this monitor for the period of time provider prescribed (Usually 14 days), you will return the monitor device in the postage paid box. Once it is returned they will download the data collected and provide [place] with a report which the provider will then review and we will call you with those results. Important tips:  Avoid showering during the first 24 hours of wearing the monitor. Avoid excessive sweating to help maximize wear time. Do not submerge the device, no hot tubs, and no swimming pools. Keep any lotions or oils away from the patch. After 24 hours you may shower with the patch on. Take brief showers with your back facing the shower head.  Do not remove patch once it has been placed because that will interrupt data and decrease adhesive wear time. Push the button when you have any symptoms and write down what you were  feeling. Once you have completed wearing your monitor, remove and place into box which has postage paid and place in your outgoing mailbox.  If for some reason you have misplaced your box then call our office and we can provide another box and/or mail it off for you.  Follow-Up: At [facility], you and your health needs are our priority.  As part of our continuing mission to provide you with exceptional heart care, we have created designated Provider Care Teams.  These Care Teams include your primary Cardiologist (physician) and Advanced Practice Providers (APPs -  Physician Assistants and Nurse Practitioners) who all work together to provide you with the care you need, when you need it.  We recommend signing up for the patient portal called "MyChart".  Sign up information is provided on this After Visit Summary.  MyChart is used to connect with patients for Virtual Visits (Telemedicine).  Patients are able to view lab/test results, encounter notes, upcoming appointments, etc.  Non-urgent messages can be sent to your provider as well.   To learn more about what you can do with MyChart, go to [email or web address].    Your next appointment:   4-6 month(s)  Provider:   Chilton Si, MD or Gillian Shields, NP    Other Instructions  Heart Healthy Diet Recommendations: A low-salt diet is recommended. Meats should be grilled, baked, or boiled. Avoid fried foods. Focus on lean protein sources like fish or chicken with vegetables and fruits. The American Heart Association is  a GREAT resource!  American Heart Association Diet and Lifeystyle Recommendations    Exercise recommendations: The American Heart Association recommends 150 minutes of moderate intensity exercise weekly. Try 30 minutes of moderate intensity exercise 4-5 times per week. This could include walking, jogging, or swimming. Keep up the great work with physical therapy!

## 2023-01-01 ENCOUNTER — Telehealth (HOSPITAL_BASED_OUTPATIENT_CLINIC_OR_DEPARTMENT_OTHER): Payer: Self-pay

## 2023-01-01 DIAGNOSIS — E785 Hyperlipidemia, unspecified: Secondary | ICD-10-CM

## 2023-01-01 NOTE — Telephone Encounter (Addendum)
Results called to patient who verbalizes understanding! Referral placed, patient knows to expect a scheduling call    ----- Message from Alver Sorrow sent at 01/01/2023  1:23 PM EDT ----- LDL (bad cholesterol) not at goal of less than 70.  Was previously 81 and has increased to 109.  Please ensure taking atorvastatin 80 mg and Zetia 10 mg daily.  If not taking routinely, recommend resuming repeat labs (FLP/LFT) in 2 months.  If taking routinely, refer to pharmacy lipid clinic.

## 2023-01-02 ENCOUNTER — Ambulatory Visit: Payer: 59 | Admitting: Pulmonary Disease

## 2023-01-02 ENCOUNTER — Encounter: Payer: Self-pay | Admitting: Pulmonary Disease

## 2023-01-02 DIAGNOSIS — J441 Chronic obstructive pulmonary disease with (acute) exacerbation: Secondary | ICD-10-CM

## 2023-01-02 MED ORDER — BUDESONIDE 0.5 MG/2ML IN SUSP
0.5000 mg | Freq: Two times a day (BID) | RESPIRATORY_TRACT | 3 refills | Status: DC
Start: 2023-01-02 — End: 2023-11-22

## 2023-01-02 MED ORDER — IPRATROPIUM-ALBUTEROL 0.5-2.5 (3) MG/3ML IN SOLN: 3.0000 mL | Freq: Two times a day (BID) | RESPIRATORY_TRACT | 3 refills | Status: DC

## 2023-01-02 MED ORDER — ARFORMOTEROL TARTRATE 15 MCG/2ML IN NEBU
15.0000 ug | INHALATION_SOLUTION | Freq: Two times a day (BID) | RESPIRATORY_TRACT | 3 refills | Status: DC
Start: 2023-01-02 — End: 2023-11-22

## 2023-01-02 NOTE — Progress Notes (Deleted)
@Patient  ID: [name], female    DOB: August 22, 1940, 82 y.o.   MRN: [id number]  Chief Complaint  Patient presents with   Follow-up    Pt complains of SOB.    Referring provider: Mechele Claude, MD  HPI:   PMH:  Smoker/ Smoking History:  Maintenance:   Pt of:   01/02/2023  - Visit     Questionaires / Pulmonary Flowsheets:   ACT:      No data to display          MMRC:     No data to display          Epworth:      No data to display          Tests:   FENO:  No results found for: "NITRICOXIDE"  PFT:    Latest Ref Rng & Units 05/10/2022    3:19 PM  PFT Results  FVC-Pre L 1.79   FVC-Predicted Pre % 77   FVC-Post L 1.72   FVC-Predicted Post % 74   Pre FEV1/FVC % % 55   Post FEV1/FCV % % 55   FEV1-Pre L 0.99   FEV1-Predicted Pre % 58   FEV1-Post L 0.95   DLCO uncorrected ml/min/mmHg 11.57   DLCO UNC% % 68   DLCO corrected ml/min/mmHg 11.57   DLCO COR %Predicted % 68   DLVA Predicted % 74   TLC L 5.96   TLC % Predicted % 129   RV % Predicted % 164     WALK:      No data to display          Imaging: MM 3D SCREENING MAMMOGRAM BILATERAL BREAST  Result Date: 12/25/2022 CLINICAL DATA:  Screening. EXAM: DIGITAL SCREENING BILATERAL MAMMOGRAM WITH TOMOSYNTHESIS AND CAD TECHNIQUE: Bilateral screening digital craniocaudal and mediolateral oblique mammograms were obtained. Bilateral screening digital breast tomosynthesis was performed. The images were evaluated with computer-aided detection. COMPARISON:  Previous exam(s). ACR Breast Density Category b: There are scattered areas of fibroglandular density. FINDINGS: There are no findings suspicious for malignancy. IMPRESSION: No mammographic evidence of malignancy. A result letter of this screening mammogram will be mailed directly to the patient. RECOMMENDATION: Screening mammogram in one year. (Code:SM-B-01Y) BI-RADS CATEGORY  1: Negative. Electronically Signed   By: Harmon Pier M.D.   On:  12/25/2022 17:23   DG Chest 2 View  Result Date: 12/18/2022 CLINICAL DATA:  Cough, dyspnea, COPD EXAM: CHEST - 2 VIEW COMPARISON:  Chest x-ray December 07, 2022 FINDINGS: The cardiomediastinal silhouette is unchanged in contour. No focal pulmonary opacity. No pleural effusion or pneumothorax. The visualized upper abdomen is unremarkable. Partially visualized ACDF hardware. No acute osseous abnormality. IMPRESSION: No acute cardiopulmonary abnormality. Electronically Signed   By: Jacob Moores M.D.   On: 12/18/2022 16:45   CT Head Wo Contrast  Result Date: 12/07/2022 CLINICAL DATA:  headache EXAM: CT HEAD WITHOUT CONTRAST TECHNIQUE: Contiguous axial images were obtained from the base of the skull through the vertex without intravenous contrast. RADIATION DOSE REDUCTION: This exam was performed according to the departmental dose-optimization program which includes automated exposure control, adjustment of the mA and/or kV according to patient size and/or use of iterative reconstruction technique. COMPARISON:  MR Head 09/17/22 FINDINGS: Brain: Compared to prior exam there is a new, but age indeterminate infarct in the left basal ganglia (series 4, image 33). No evidence of hemorrhage, hydrocephalus, extra-axial collection or mass lesion/mass effect. Sequela of moderate chronic microvascular ischemic change. 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 Mineralization of the basal ganglia bilaterally. Vascular: No hyperdense vessel or unexpected calcification. Skull: Normal. Negative for fracture or focal lesion. Sinuses/Orbits: No middle ear or mastoid effusion. Paranasal sinuses are clear. Bilateral lens replacement. Orbits are otherwise unremarkable. Other: None. IMPRESSION: Compared to prior exam there is a new, but age indeterminate infarct in the left basal ganglia. If there is clinical concern for an acute infarct, consider further evaluation with MRI. Electronically Signed   By: Lorenza Cambridge M.D.   On:  12/07/2022 15:23   DG Chest 2 View  Result Date: 12/07/2022 CLINICAL DATA:  chest pain EXAM: CHEST - 2 VIEW COMPARISON:  CXR 09/16/22 FINDINGS: No pleural effusion. No pneumothorax. No focal airspace opacity. Normal cardiac and mediastinal contours. No radiographically apparent displaced rib fractures. Prominent bilateral interstitial opacities could represent pulmonary venous congestion or atypical infection. Partially visualized cervical spinal fusion hardware in place. Vertebral body heights are maintained. IMPRESSION: Prominent bilateral interstitial opacities could represent pulmonary venous congestion or atypical infection. Electronically Signed   By: Lorenza Cambridge M.D.   On: 12/07/2022 13:47    Lab Results:  CBC    Component Value Date/Time   WBC 8.3 12/11/2022 1028   WBC 7.6 12/07/2022 1347   RBC 5.00 12/11/2022 1028   RBC 5.12 (H) 12/07/2022 1347   HGB 15.1 12/11/2022 1028   HCT 46.9 (H) 12/11/2022 1028   PLT 152 12/11/2022 1028   MCV 94 12/11/2022 1028   MCH 30.2 12/11/2022 1028   MCH 30.3 12/07/2022 1347   MCHC 32.2 12/11/2022 1028   MCHC 31.6 12/07/2022 1347   RDW 13.3 12/11/2022 1028   LYMPHSABS 0.4 (L) 12/11/2022 1028   MONOABS 0.2 03/25/2022 0818   EOSABS 0.0 12/11/2022 1028   BASOSABS 0.0 12/11/2022 1028    BMET    Component Value Date/Time   NA 142 12/11/2022 1028   K 4.4 12/11/2022 1028   CL 104 12/11/2022 1028   CO2 23 12/11/2022 1028   GLUCOSE 113 (H) 12/11/2022 1028   GLUCOSE 118 (H) 12/07/2022 1347   BUN 28 (H) 12/11/2022 1028   CREATININE 1.35 (H) 12/11/2022 1028   CALCIUM 9.7 12/11/2022 1028   GFRNONAA 48 (L) 12/07/2022 1347   GFRAA 53 (L) 01/23/2020 0700    BNP    Component Value Date/Time   BNP 104.1 (H) 12/11/2022 1028   BNP 134.0 (H) 12/07/2022 1419    ProBNP No results found for: "PROBNP"  Specialty Problems       Pulmonary Problems   COPD with chronic bronchitis    Allergies  Allergen Reactions   Penicillins Anaphylaxis,  Swelling, Rash and Other (See Comments)    Has patient had a PCN reaction causing immediate rash, facial/tongue/throat swelling, SOB or lightheadedness with hypotension: yes Has patient had a PCN reaction causing severe rash involving mucus membranes or skin necrosis: no Has patient had a PCN reaction that required hospitalization: yes Has patient had a PCN reaction occurring within the last 10 years: no If all of the above answers are "NO", then may proceed with Cephalosporin use.     Immunization History  Administered Date(s) Administered   COVID-19, mRNA, vaccine(Comirnaty)12 years and older 03/06/2022   Fluad Quad(high Dose 65+) 02/26/2016, 02/25/2017, 02/26/2018, 02/24/2020, 02/21/2021   Influenza Split 02/13/2012   Influenza, High Dose Seasonal PF 02/19/2019   Influenza-Unspecified 03/28/2013   Moderna Covid-19 Vaccine Bivalent Booster 74yrs & up 07/18/2021   Moderna Sars-Covid-2 Vaccination 07/23/2019, 08/21/2019, 03/23/2020   Pneumococcal  Conjugate-13 11/24/2014   Pneumococcal Polysaccharide-23 07/09/2016   Pneumococcal-Unspecified 07/09/2016   Tdap 11/24/2014   Zoster Recombinant(Shingrix) 12/27/2021, 06/20/2022    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   Stroke (HCC)    SLIGHT RT SIDE WEAKNESS 2001   Unintentional weight loss 03/19/2022    Tobacco History: Social History   Tobacco Use  Smoking Status Former   Current packs/day: 0.00   Average packs/day: 0.5 packs/day for 61.8 years (30.9 ttl pk-yrs)   Types: Cigarettes   Start date: 34   Quit date: 03/24/2022   Years since quitting: 0.7   Passive exposure: Past  Smokeless Tobacco Never   Counseling  given: Not Answered   Continue to not smoke  Outpatient Encounter Medications as of 01/02/2023  Medication Sig   acetaminophen (TYLENOL) 500 MG tablet Take 2 tablets (1,000 mg total) by mouth 3 (three) times daily.   albuterol (VENTOLIN HFA) 108 (90 Base) MCG/ACT inhaler Inhale 2 puffs into the lungs every 6 (six) hours as needed for wheezing or shortness of breath.   amLODipine (NORVASC) 10 MG tablet Take 1 tablet (10 mg total) by mouth daily.   atorvastatin (LIPITOR) 80 MG tablet Take 1 tablet (80 mg total) by mouth daily.   budesonide-formoterol (SYMBICORT) 160-4.5 MCG/ACT inhaler Inhale 2 puffs into the lungs 2 (two) times daily.   clopidogrel (PLAVIX) 75 MG tablet Take 1 tablet (75 mg total) by mouth daily.   cyclobenzaprine (FLEXERIL) 5 MG tablet TAKE ONE TABLET THREE TIMES DAILY AS NEEDED FOR MUSCLE SPASMS (Patient taking differently: Take 5 mg by mouth 3 (three) times daily as needed for muscle spasms.)   ezetimibe (ZETIA) 10 MG tablet Take 1 tablet (10 mg total) by mouth daily.   furosemide (LASIX) 40 MG tablet Take 1 tablet (40 mg total) by mouth daily. For swelling   hydrALAZINE (APRESOLINE) 50 MG tablet Take 1 tablet (50 mg total) by mouth 3 (three) times daily.   ipratropium-albuterol (DUONEB) 0.5-2.5 (3) MG/3ML SOLN Take 3 mLs by nebulization in the morning and at bedtime.   isosorbide dinitrate (ISORDIL) 20 MG tablet Take 1 tablet (20 mg total) by mouth 3 (three) times daily.   metoprolol succinate (TOPROL-XL) 50 MG 24 hr tablet Take 1 tablet (50 mg total) by mouth daily. For blood pressure control   olmesartan (BENICAR) 40 MG tablet Take 1 tablet (40 mg total) by mouth daily. For blood pressure   potassium chloride SA (KLOR-CON M) 20 MEQ tablet Take 1 tablet (20 mEq total) by mouth daily. For potassium replacement/ supplement   vitamin B-12 1000 MCG tablet Take 1 tablet (1,000 mcg total) by mouth daily.   Vitamin D, Ergocalciferol, (DRISDOL) 1.25 MG (50000 UNIT) CAPS capsule Take  1 capsule (50,000 Units total) by mouth every 7 (seven) days.   [DISCONTINUED] budesonide (PULMICORT) 0.5 MG/2ML nebulizer solution Take 2 mLs (0.5 mg total) by nebulization 2 (two) times daily.   arformoterol (BROVANA) 15 MCG/2ML NEBU Take 2 mLs (15 mcg total) by nebulization 2 (two) times daily.   budesonide (PULMICORT) 0.5 MG/2ML nebulizer solution Take 2 mLs (0.5 mg total) by nebulization 2 (two) times daily.   [  DISCONTINUED] arformoterol (BROVANA) 15 MCG/2ML NEBU Take 2 mLs (15 mcg total) by nebulization 2 (two) times daily.   No facility-administered encounter medications on file as of 01/02/2023.     Review of Systems  Review of Systems   Physical Exam  BP 138/84 (BP Location: Left Arm, Cuff Size: Normal)   Pulse 74   Temp (!) 97.1 F (36.2 C) (Oral)   Ht 5' 1.25" (1.556 m)   Wt 139 lb 12.8 oz (63.4 kg)   SpO2 94%   BMI 26.20 kg/m   Wt Readings from Last 5 Encounters:  01/02/23 139 lb 12.8 oz (63.4 kg)  12/31/22 139 lb (63 kg)  12/19/22 140 lb 6.4 oz (63.7 kg)  12/11/22 136 lb 12.8 oz (62.1 kg)  12/07/22 130 lb (59 kg)    BMI Readings from Last 5 Encounters:  01/02/23 26.20 kg/m  12/31/22 25.63 kg/m  12/19/22 25.89 kg/m  12/11/22 25.22 kg/m  12/07/22 23.97 kg/m     Physical Exam    Assessment & Plan:      Return in about 6 months (around 07/05/2023).   Karren Burly, MD 01/02/2023   This appointment required *** minutes of patient care (this includes precharting, chart review, review of results, face-to-face care, etc.).

## 2023-01-02 NOTE — Progress Notes (Signed)
@Patient  ID: [name], female    DOB: Dec 05, 1940, 82 y.o.   MRN: [id number]  Chief Complaint  Patient presents with   Follow-up    Pt complains of SOB.    Referring provider: Mechele Claude, MD  HPI:   82 y.o. woman whom we are seeing in consultation for evaluation of dyspnea on exertion large related to COPD confirmed on PFT 04/2022.  Most recent cardiology note reviewed.  Most recent PCP note x2 reviewed.    Overall doing okay.  Breathing feels stable.  Good adherence to nebulized arformoterol and budesonide.  Did not start Mikael Spray was too expensive.  First time being informed of this.  Was prescribed 6 months ago.  Has DuoNebs using as needed.  Has Symbicort and rescue inhaler to use as needed when out and about.  She finds that this does help her symptoms when she needs it.   HPI at initial visit: Patient notes long history of dyspnea on exertion.  Worse with stairs or inclines.  However she remains active.  Walking up to 3 miles daily.  Unfortunate, developed severe shortness of breath end of October 2023.  Noted to be hypoxemic on room air via EMS.  Treated with steroids and inhaled bronchodilators.  Chest x-ray on my review interpretation reveals clear lungs bilaterally 03/25/2022.  Prior chest x-ray 03/12/2022 shows clear lungs bilaterally on my review and interpretation, this preceded hospitalization.  She improved.  Stable on room air.  Discharged on prednisone taper.  She is continued arformoterol and budesonide nebulizer.  She felt this certainly helps.  She is albuterol prior to walking.  Relatively rare use of DuoNebs at the home.  She is not walking as far she was before but wants to get back up to longer walks.  She enjoys this.  She enjoys being outside, in nature, spends time praying.  She was seen by cardiology and a CT coronary scan was ordered to evaluate shortness of breath.  This did demonstrate coronary calcification is relatively significant with reported no  significant flow limitation.  CT images 03/2022 demonstrate emphysema, thickened bronchioles, mosaicism, otherwise clear lungs on my review and interpretation.  PMH: Tobacco abuse in remission, hypertension, hyperlipidemia, GERD Surgical history: Hysterectomy, cataract surgery, tonsillectomy Family history: Bipolar disease, asthma, CAD, son with bipolar disease social history: Former smoker, 25-pack-year, quit in fall 2023, [place] in [facility]   Questionaires / Pulmonary Flowsheets:   ACT:      No data to display          MMRC:     No data to display          Epworth:      No data to display          Tests:   FENO:  No results found for: "NITRICOXIDE"  PFT:    Latest Ref Rng & Units 05/10/2022    3:19 PM  PFT Results  FVC-Pre L 1.79   FVC-Predicted Pre % 77   FVC-Post L 1.72   FVC-Predicted Post % 74   Pre FEV1/FVC % % 55   Post FEV1/FCV % % 55   FEV1-Pre L 0.99   FEV1-Predicted Pre % 58   FEV1-Post L 0.95   DLCO uncorrected ml/min/mmHg 11.57   DLCO UNC% % 68   DLCO corrected ml/min/mmHg 11.57   DLCO COR %Predicted % 68   DLVA Predicted % 74   TLC L 5.96   TLC % Predicted % 129   RV %  Predicted % 164   Personally reviewed and interpreted as moderate fixed obstruction, no bronchodilator response, lung biopsy send with hyperinflation and air trapping, DLCO moderately reduced.  WALK:      No data to display          Imaging: Personally reviewed and as per EMR and discussion in this note MM 3D SCREENING MAMMOGRAM BILATERAL BREAST  Result Date: 12/25/2022 CLINICAL DATA:  Screening. EXAM: DIGITAL SCREENING BILATERAL MAMMOGRAM WITH TOMOSYNTHESIS AND CAD TECHNIQUE: Bilateral screening digital craniocaudal and mediolateral oblique mammograms were obtained. Bilateral screening digital breast tomosynthesis was performed. The images were evaluated with computer-aided detection. COMPARISON:  Previous exam(s). ACR Breast Density Category b:  There are scattered areas of fibroglandular density. FINDINGS: There are no findings suspicious for malignancy. IMPRESSION: No mammographic evidence of malignancy. A result letter of this screening mammogram will be mailed directly to the patient. RECOMMENDATION: Screening mammogram in one year. (Code:SM-B-01Y) BI-RADS CATEGORY  1: Negative. Electronically Signed   By: Harmon Pier M.D.   On: 12/25/2022 17:23   DG Chest 2 View  Result Date: 12/18/2022 CLINICAL DATA:  Cough, dyspnea, COPD EXAM: CHEST - 2 VIEW COMPARISON:  Chest x-ray December 07, 2022 FINDINGS: The cardiomediastinal silhouette is unchanged in contour. No focal pulmonary opacity. No pleural effusion or pneumothorax. The visualized upper abdomen is unremarkable. Partially visualized ACDF hardware. No acute osseous abnormality. IMPRESSION: No acute cardiopulmonary abnormality. Electronically Signed   By: Jacob Moores M.D.   On: 12/18/2022 16:45   CT Head Wo Contrast  Result Date: 12/07/2022 CLINICAL DATA:  headache EXAM: CT HEAD WITHOUT CONTRAST TECHNIQUE: Contiguous axial images were obtained from the base of the skull through the vertex without intravenous contrast. RADIATION DOSE REDUCTION: This exam was performed according to the departmental dose-optimization program which includes automated exposure control, adjustment of the mA and/or kV according to patient size and/or use of iterative reconstruction technique. COMPARISON:  MR Head 09/17/22 FINDINGS: Brain: Compared to prior exam there is a new, but age indeterminate infarct in the left basal ganglia (series 4, image 33). No evidence of hemorrhage, hydrocephalus, extra-axial collection or mass lesion/mass effect. Sequela of moderate chronic microvascular ischemic change. 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 Mineralization of the basal ganglia bilaterally. Vascular: No hyperdense vessel or unexpected calcification. Skull: Normal. Negative for fracture or focal lesion. Sinuses/Orbits:  No middle ear or mastoid effusion. Paranasal sinuses are clear. Bilateral lens replacement. Orbits are otherwise unremarkable. Other: None. IMPRESSION: Compared to prior exam there is a new, but age indeterminate infarct in the left basal ganglia. If there is clinical concern for an acute infarct, consider further evaluation with MRI. Electronically Signed   By: Lorenza Cambridge M.D.   On: 12/07/2022 15:23   DG Chest 2 View  Result Date: 12/07/2022 CLINICAL DATA:  chest pain EXAM: CHEST - 2 VIEW COMPARISON:  CXR 09/16/22 FINDINGS: No pleural effusion. No pneumothorax. No focal airspace opacity. Normal cardiac and mediastinal contours. No radiographically apparent displaced rib fractures. Prominent bilateral interstitial opacities could represent pulmonary venous congestion or atypical infection. Partially visualized cervical spinal fusion hardware in place. Vertebral body heights are maintained. IMPRESSION: Prominent bilateral interstitial opacities could represent pulmonary venous congestion or atypical infection. Electronically Signed   By: Lorenza Cambridge M.D.   On: 12/07/2022 13:47    Lab Results: Personally reviewed, notably is not as high as 500 02/2022 CBC    Component Value Date/Time   WBC 8.3 12/11/2022 1028   WBC 7.6 12/07/2022 1347  RBC 5.00 12/11/2022 1028   RBC 5.12 (H) 12/07/2022 1347   HGB 15.1 12/11/2022 1028   HCT 46.9 (H) 12/11/2022 1028   PLT 152 12/11/2022 1028   MCV 94 12/11/2022 1028   MCH 30.2 12/11/2022 1028   MCH 30.3 12/07/2022 1347   MCHC 32.2 12/11/2022 1028   MCHC 31.6 12/07/2022 1347   RDW 13.3 12/11/2022 1028   LYMPHSABS 0.4 (L) 12/11/2022 1028   MONOABS 0.2 03/25/2022 0818   EOSABS 0.0 12/11/2022 1028   BASOSABS 0.0 12/11/2022 1028    BMET    Component Value Date/Time   NA 142 12/11/2022 1028   K 4.4 12/11/2022 1028   CL 104 12/11/2022 1028   CO2 23 12/11/2022 1028   GLUCOSE 113 (H) 12/11/2022 1028   GLUCOSE 118 (H) 12/07/2022 1347   BUN 28 (H)  12/11/2022 1028   CREATININE 1.35 (H) 12/11/2022 1028   CALCIUM 9.7 12/11/2022 1028   GFRNONAA 48 (L) 12/07/2022 1347   GFRAA 53 (L) 01/23/2020 0700    BNP    Component Value Date/Time   BNP 104.1 (H) 12/11/2022 1028   BNP 134.0 (H) 12/07/2022 1419    ProBNP No results found for: "PROBNP"  Specialty Problems       Pulmonary Problems   COPD with chronic bronchitis    Allergies  Allergen Reactions   Penicillins Anaphylaxis, Swelling, Rash and Other (See Comments)    Has patient had a PCN reaction causing immediate rash, facial/tongue/throat swelling, SOB or lightheadedness with hypotension: yes Has patient had a PCN reaction causing severe rash involving mucus membranes or skin necrosis: no Has patient had a PCN reaction that required hospitalization: yes Has patient had a PCN reaction occurring within the last 10 years: no If all of the above answers are "NO", then may proceed with Cephalosporin use.     Immunization History  Administered Date(s) Administered   COVID-19, mRNA, vaccine(Comirnaty)12 years and older 03/06/2022   Fluad Quad(high Dose 65+) 02/26/2016, 02/25/2017, 02/26/2018, 02/24/2020, 02/21/2021   Influenza Split 02/13/2012   Influenza, High Dose Seasonal PF 02/19/2019   Influenza-Unspecified 03/28/2013   Moderna Covid-19 Vaccine Bivalent Booster 40yrs & up 07/18/2021   Moderna Sars-Covid-2 Vaccination 07/23/2019, 08/21/2019, 03/23/2020   Pneumococcal Conjugate-13 11/24/2014   Pneumococcal Polysaccharide-23 07/09/2016   Pneumococcal-Unspecified 07/09/2016   Tdap 11/24/2014   Zoster Recombinant(Shingrix) 12/27/2021, 06/20/2022    Past Medical History:  Diagnosis Date   Arthritis    OA   Atypical chest pain 03/19/2022   GERD (gastroesophageal reflux disease)    Headache(784.0)    Hepatitis    history of Hepatitis 20 years ago; not sure what kind   History of gout    Hypertension    Hypertensive encephalopathy    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   Stroke (HCC)    SLIGHT RT SIDE WEAKNESS 2001   Unintentional weight loss 03/19/2022    Tobacco History: Social History   Tobacco Use  Smoking Status Former   Current packs/day: 0.00   Average packs/day: 0.5 packs/day for 61.8 years (30.9 ttl pk-yrs)   Types: Cigarettes   Start date: 71   Quit date: 03/24/2022   Years since quitting: 0.7   Passive exposure: Past  Smokeless Tobacco Never   Counseling given: Not Answered   Continue to not smoke  Outpatient Encounter Medications as of 01/02/2023  Medication Sig   acetaminophen (TYLENOL) 500 MG tablet Take 2 tablets (1,000 mg total) by mouth 3 (three) times daily.   albuterol (VENTOLIN  HFA) 108 (90 Base) MCG/ACT inhaler Inhale 2 puffs into the lungs every 6 (six) hours as needed for wheezing or shortness of breath.   amLODipine (NORVASC) 10 MG tablet Take 1 tablet (10 mg total) by mouth daily.   atorvastatin (LIPITOR) 80 MG tablet Take 1 tablet (80 mg total) by mouth daily.   budesonide-formoterol (SYMBICORT) 160-4.5 MCG/ACT inhaler Inhale 2 puffs into the lungs 2 (two) times daily.   clopidogrel (PLAVIX) 75 MG tablet Take 1 tablet (75 mg total) by mouth daily.   cyclobenzaprine (FLEXERIL) 5 MG tablet TAKE ONE TABLET THREE TIMES DAILY AS NEEDED FOR MUSCLE SPASMS (Patient taking differently: Take 5 mg by mouth 3 (three) times daily as needed for muscle spasms.)   ezetimibe (ZETIA) 10 MG tablet Take 1 tablet (10 mg total) by mouth daily.   furosemide (LASIX) 40 MG tablet Take 1 tablet (40 mg total) by mouth daily. For swelling   hydrALAZINE (APRESOLINE) 50 MG tablet Take 1 tablet (50 mg total) by mouth 3 (three) times daily.   ipratropium-albuterol (DUONEB) 0.5-2.5 (3) MG/3ML SOLN Take 3 mLs by nebulization in the morning and at bedtime.   isosorbide dinitrate (ISORDIL) 20 MG tablet Take 1 tablet (20 mg total) by mouth 3 (three) times daily.   metoprolol succinate (TOPROL-XL) 50 MG 24 hr tablet Take 1  tablet (50 mg total) by mouth daily. For blood pressure control   olmesartan (BENICAR) 40 MG tablet Take 1 tablet (40 mg total) by mouth daily. For blood pressure   potassium chloride SA (KLOR-CON M) 20 MEQ tablet Take 1 tablet (20 mEq total) by mouth daily. For potassium replacement/ supplement   vitamin B-12 1000 MCG tablet Take 1 tablet (1,000 mcg total) by mouth daily.   Vitamin D, Ergocalciferol, (DRISDOL) 1.25 MG (50000 UNIT) CAPS capsule Take 1 capsule (50,000 Units total) by mouth every 7 (seven) days.   [DISCONTINUED] budesonide (PULMICORT) 0.5 MG/2ML nebulizer solution Take 2 mLs (0.5 mg total) by nebulization 2 (two) times daily.   arformoterol (BROVANA) 15 MCG/2ML NEBU Take 2 mLs (15 mcg total) by nebulization 2 (two) times daily.   budesonide (PULMICORT) 0.5 MG/2ML nebulizer solution Take 2 mLs (0.5 mg total) by nebulization 2 (two) times daily.   [DISCONTINUED] arformoterol (BROVANA) 15 MCG/2ML NEBU Take 2 mLs (15 mcg total) by nebulization 2 (two) times daily.   No facility-administered encounter medications on file as of 01/02/2023.     Review of Systems  Review of Systems  N/a Physical Exam  BP 138/84 (BP Location: Left Arm, Cuff Size: Normal)   Pulse 74   Temp (!) 97.1 F (36.2 C) (Oral)   Ht 5' 1.25" (1.556 m)   Wt 139 lb 12.8 oz (63.4 kg)   SpO2 94%   BMI 26.20 kg/m   Wt Readings from Last 5 Encounters:  01/02/23 139 lb 12.8 oz (63.4 kg)  12/31/22 139 lb (63 kg)  12/19/22 140 lb 6.4 oz (63.7 kg)  12/11/22 136 lb 12.8 oz (62.1 kg)  12/07/22 130 lb (59 kg)    BMI Readings from Last 5 Encounters:  01/02/23 26.20 kg/m  12/31/22 25.63 kg/m  12/19/22 25.89 kg/m  12/11/22 25.22 kg/m  12/07/22 23.97 kg/m     Physical Exam General: Sitting in chair, no acute distress Eyes: EOMI, icterus Neck: Supple, JVP Pulmonary: Clear, distant, normal work of breathing Cardiovascular: Warm, no edema Abdomen: Thin, sounds present MSK: No synovitis, no joint  effusion Neuro: Normal gait, no weakness Psych: Normal mood, full affect  Assessment & Plan:   COPD Gold B: No PFTs 04/2022 moderate obstruction.  Relatively stable symptoms on ICS LABA nebulizer.  Yupelri too expensive.  Advised to use DuoNebs twice a day.  All nebulized medicines refilled today.    DOE: Suspect related to COPD.  Symptoms stable on above regimen.   Return in about 6 months (around 07/05/2023).   Karren Burly, MD 01/02/2023

## 2023-01-02 NOTE — Patient Instructions (Signed)
Nice to see you again  I provided 1 years a refill for the Pulmicort and budesonide nebulizer solution, these are the breathing treatments twice a day  In addition I provided 1 years worth of refill for DuoNebs, use this twice a day as well  Return to clinic in 6 months or sooner as needed with Dr. Judeth Horn

## 2023-01-03 ENCOUNTER — Encounter: Payer: Self-pay | Admitting: *Deleted

## 2023-01-03 ENCOUNTER — Ambulatory Visit: Payer: Self-pay | Admitting: *Deleted

## 2023-01-03 NOTE — Patient Instructions (Signed)
Visit Information  Thank you for taking time to visit with me today. Please don't hesitate to contact me if I can be of assistance to you.   Following are the goals we discussed today:   Goals Addressed               This Visit's Progress     Receive Assistance Obtaining Personal Care Services. (pt-stated)   On track     Care Coordination Interventions:  Interventions Today    Flowsheet Row Most Recent Value  Chronic Disease   Chronic disease during today's visit Hypertension (HTN), Chronic Obstructive Pulmonary Disease (COPD), Chronic Kidney Disease/End Stage Renal Disease (ESRD), Other  [Inability to Perform Activities of Daily Living Independently, Recurrent Depression, Cigarette Nicotine Dependence, Generalized Anxiety Disorder, & History of Transient Ischemic Attack]  General Interventions   General Interventions Discussed/Reviewed General Interventions Discussed, Labs, Vaccines, Doctor Visits, Communication with, Level of Care, [facility], Referral to Nurse, Annual Foot Exam, Health Screening, Lipid Profile, General Interventions Reviewed, Durable Medical Equipment (DME), Annual Eye Exam  [Encouraged]  Labs Hgb A1c every 3 months, Kidney Function  [Encouraged]  Vaccines COVID-19, Flu, Pneumonia, RSV, Shingles, Tetanus/Pertussis/Diphtheria  [Encouraged]  Doctor Visits Discussed/Reviewed Doctor Visits Discussed, Specialist, Doctor Visits Reviewed, Annual Wellness Visits, PCP  [Encouraged]  Health Screening Bone Density, Colonoscopy, Mammogram  [Encouraged]  Durable Medical Equipment (DME) BP Cuff, Walker, Other  [Eyeglasses, Cane]  PCP/Specialist Visits Compliance with follow-up visit  [Encouraged]  Communication with PCP/Specialists, RN  [Encouraged]  Level of Care Adult Daycare, Personal Care Services, Applications, Assisted Living, Skilled Nursing Facility  [Encouraged]  Applications Medicaid, Personal Care Services, [place]-2  [Encouraged]  Exercise Interventions    Exercise Discussed/Reviewed Exercise Discussed, Assistive device use and maintanence, Exercise Reviewed, Physical Activity, Weight Managment  [Encouraged]  Physical Activity Discussed/Reviewed Physical Activity Discussed, Home Exercise Program (HEP), Physical Activity Reviewed, Types of exercise  [Encouraged]  Weight Management Weight maintenance  [Encouraged]  Education Interventions   Education Provided Provided Therapist, sports, Provided Web-based Education, Provided Education  [Encouraged]  Provided Verbal Education On Nutrition, Mental Health/Coping with Illness, When to see the doctor, [facility], [employer], Medication, Blood Sugar Monitoring, Exercise, Applications, Labs, Eye Care, Foot Care  [Encouraged]  Labs Reviewed Hgb A1c  [Encouraged]  Applications Medicaid, Personal Care Services, [place]-2  [Encouraged]  Mental Health Interventions   Mental Health Discussed/Reviewed Mental Health Discussed, Anxiety, Depression, Grief and Loss, Mental Health Reviewed, Coping Strategies, Substance Abuse, Suicide, Other, Crisis  [Domestic Violence]  Nutrition Interventions   Nutrition Discussed/Reviewed Nutrition Discussed, Adding fruits and vegetables, Nutrition Reviewed, Fluid intake, Increasing proteins, Decreasing fats, Decreasing salt, Decreasing sugar intake, Carbohydrate meal planning, Portion sizes, Supplemental nutrition  [Encouraged]  Pharmacy Interventions   Pharmacy Dicussed/Reviewed Pharmacy Topics Discussed, Medications and their functions, Medication Adherence, Pharmacy Topics Reviewed, Affording Medications  [Encouraged]  Medication Adherence --  [N/A]  Safety Interventions   Safety Discussed/Reviewed Safety Discussed, Safety Reviewed, Fall Risk, Home Safety  [Encouraged]  Home Safety Contact home health agency, Contact provider for referral to PT/OT, Assistive Devices, Refer for community resources, Refer for home visit, Need for home safety assessment  [Encouraged]   Advanced Directive Interventions   Advanced Directives Discussed/Reviewed Advanced Directives Discussed, Provided resource for acquiring and filling out documents  [Encouraged]      Active Listening & Reflection Utilized.  Verbalization of Feelings Encouraged.  Emotional Support Provided. Feelings of Caregiver Stress & Burnout Validated. Symptoms of Caregiver Fatigue Acknowledged. Caregiver Resources Revisited. Caregiver Support Groups Reviewed. Self-Enrollment  in Caregiver Support Group of Interest Emphasized, from List Provided. Problem Solving Interventions Activated. Task-Centered Solutions Employed.   Solution-Focused Strategies Implemented. Acceptance & Commitment Therapy Performed. Brief Cognitive Behavioral Therapy Initiated. Client-Centered Therapy Indicated. Encouraged Administration of Medications, Exactly as Prescribed. Encouraged Increased Level of Activity & Exercise, as Tolerated. Encouraged Self-Enrollment in Smoking Cessation Classes, Services, Agencies, & Resources of Interest in [place], from List Provided, in An Effort to Quit Smoking. Encouraged Continued Consideration of Higher Level of Care Placement Options in [facility] (I.e Assisted Living, Extended Care, Intermediate Level Care, Etc.), from List Provided. Confirmed Referral Placed to Meals on Wheels, with Aging, Disability, & Transit Services of [place] [phone]), by Lenard Forth, Population Health Care Guide with [facility] [phone]), on 12/28/2022, in An Effort to Establish Prepared Meal Delivery Services in The Home. Confirmed Receipt & Thoroughly Reviewed the Following List of [employer], Nurse, adult, Resources, Occupational psychologist in [place], to [employer] & Enter[employer] Questions: ~ Adult Day Care Programs  ~ In-Home Care & Respite Agencies ~ Home Health Care Agencies ~ Respite Care Agencies & Facilities ~ Field seismologist  ~ Theatre manager Providers Encouraged Solicitor with [employer], Nurse, adult, & Resources of Interest in [place], from List Provided, in An Effort to Obtain Care & Supervision in The Home [employer]. Encouraged Completion of Application for Personal Care Services, through [facility] [phone]), & Submission to Dr. Mechele Claude, Primary Care Provider with [facility] [phone]), for Review & Signature. Encouraged Utilization of Free Armed forces operational officer, through [employer] [phone]), for Transport To & From Physician Appointments & Provider Practice Visits. Encouraged Utilization of Free Armed forces operational officer, through [employer] [phone]), for Transport To & From Physician Appointments & Provider Practice Visits. Encouraged Contact with CSW (# [phone]), if You Have Questions, Need Assistance, or If Additional Social Work Needs Are Identified Between Now & Our Next Scheduled Follow-Up Outreach Call.      Our next appointment is by telephone on 01/29/2023 at 1:15 pm.   Please call the care guide team at [phone] if you need to cancel or reschedule your appointment.   If you are experiencing a Mental Health or Behavioral Health Crisis or need someone to talk to, please call the Suicide and Crisis Lifeline: 988 call the [place] National Suicide Prevention Lifeline: 443-500-7899 or TTY: 743 284 9053 TTY [phone]) to talk to a trained counselor call 1-800-273-TALK (toll free, 24 hour hotline) go to [facility] [address] [place] [phone]) call the [facility] Crisis Line: [phone] call 911  Patient verbalizes understanding of instructions and care plan provided today and agrees to view in MyChart. Active MyChart status  and patient understanding of how to access instructions and care plan via MyChart confirmed with patient.     Telephone follow up appointment with care management team member scheduled for:  01/29/2023 at 1:15 pm.   [name], BSW, MSW, LCSW  Licensed Clinical Social Worker  [facility]  Mailing [place]. [address] [place], [place] [address] Physical Address-[address], [place], [place] [address] Toll Free Main # [phone] Fax # [phone] Cell # [phone] [email or web address]

## 2023-01-03 NOTE — Patient Outreach (Signed)
Care Coordination   Follow Up Visit Note   01/03/2023  Name: [name] MRN: [id number] DOB: 12-Dec-1940  [name] is a 82 y.o. year old female who sees Stacks, Broadus John, MD for primary care. I spoke with [name] by phone today.  What matters to the patients health and wellness today?  Receive Assistance Obtaining Personal Care Services.   Goals Addressed               This Visit's Progress     Receive Assistance Obtaining Personal Care Services. (pt-stated)   On track     Care Coordination Interventions:  Interventions Today    Flowsheet Row Most Recent Value  Chronic Disease   Chronic disease during today's visit Hypertension (HTN), Chronic Obstructive Pulmonary Disease (COPD), Chronic Kidney Disease/End Stage Renal Disease (ESRD), Other  [Inability to Perform Activities of Daily Living Independently, Recurrent Depression, Cigarette Nicotine Dependence, Generalized Anxiety Disorder, & History of Transient Ischemic Attack]  General Interventions   General Interventions Discussed/Reviewed General Interventions Discussed, Labs, Vaccines, Doctor Visits, Communication with, Level of Care, [facility], Referral to Nurse, Annual Foot Exam, Health Screening, Lipid Profile, General Interventions Reviewed, Durable Medical Equipment (DME), Annual Eye Exam  [Encouraged]  Labs Hgb A1c every 3 months, Kidney Function  [Encouraged]  Vaccines COVID-19, Flu, Pneumonia, RSV, Shingles, Tetanus/Pertussis/Diphtheria  [Encouraged]  Doctor Visits Discussed/Reviewed Doctor Visits Discussed, Specialist, Doctor Visits Reviewed, Annual Wellness Visits, PCP  [Encouraged]  Health Screening Bone Density, Colonoscopy, Mammogram  [Encouraged]  Durable Medical Equipment (DME) BP Cuff, Walker, Other  [Eyeglasses, Cane]  PCP/Specialist Visits Compliance with follow-up visit  [Encouraged]  Communication with PCP/Specialists, RN  [Encouraged]  Level of Care Adult Daycare, Personal Care  Services, Applications, Assisted Living, Skilled Nursing Facility  [Encouraged]  Applications Medicaid, Personal Care Services, [place]-2  [Encouraged]  Exercise Interventions   Exercise Discussed/Reviewed Exercise Discussed, Assistive device use and maintanence, Exercise Reviewed, Physical Activity, Weight Managment  [Encouraged]  Physical Activity Discussed/Reviewed Physical Activity Discussed, Home Exercise Program (HEP), Physical Activity Reviewed, Types of exercise  [Encouraged]  Weight Management Weight maintenance  [Encouraged]  Education Interventions   Education Provided Provided Therapist, sports, Provided Web-based Education, Provided Education  [Encouraged]  Provided Verbal Education On Nutrition, Mental Health/Coping with Illness, When to see the doctor, [facility], [employer], Medication, Blood Sugar Monitoring, Exercise, Applications, Labs, Eye Care, Foot Care  [Encouraged]  Labs Reviewed Hgb A1c  [Encouraged]  Applications Medicaid, Personal Care Services, [place]-2  [Encouraged]  Mental Health Interventions   Mental Health Discussed/Reviewed Mental Health Discussed, Anxiety, Depression, Grief and Loss, Mental Health Reviewed, Coping Strategies, Substance Abuse, Suicide, Other, Crisis  [Domestic Violence]  Nutrition Interventions   Nutrition Discussed/Reviewed Nutrition Discussed, Adding fruits and vegetables, Nutrition Reviewed, Fluid intake, Increasing proteins, Decreasing fats, Decreasing salt, Decreasing sugar intake, Carbohydrate meal planning, Portion sizes, Supplemental nutrition  [Encouraged]  Pharmacy Interventions   Pharmacy Dicussed/Reviewed Pharmacy Topics Discussed, Medications and their functions, Medication Adherence, Pharmacy Topics Reviewed, Affording Medications  [Encouraged]  Medication Adherence --  [N/A]  Safety Interventions   Safety Discussed/Reviewed Safety Discussed, Safety Reviewed, Fall Risk, Home Safety  [Encouraged]  Home Safety Contact home health  agency, Contact provider for referral to PT/OT, Assistive Devices, Refer for community resources, Refer for home visit, Need for home safety assessment  [Encouraged]  Advanced Directive Interventions   Advanced Directives Discussed/Reviewed Advanced Directives Discussed, Provided resource for acquiring and filling out documents  [Encouraged]      Active Listening & Reflection Utilized.  Verbalization of Feelings Encouraged.  Emotional Support Provided. Feelings of Caregiver Stress & Burnout Validated. Symptoms of Caregiver Fatigue Acknowledged. Caregiver Resources Revisited. Caregiver Support Groups Reviewed. Self-Enrollment in Caregiver Support Group of Interest Emphasized, from List Provided. Problem Solving Interventions Activated. Task-Centered Solutions Employed.   Solution-Focused Strategies Implemented. Acceptance & Commitment Therapy Performed. Brief Cognitive Behavioral Therapy Initiated. Client-Centered Therapy Indicated. Encouraged Administration of Medications, Exactly as Prescribed. Encouraged Increased Level of Activity & Exercise, as Tolerated. Encouraged Self-Enrollment in Smoking Cessation Classes, Services, Agencies, & Resources of Interest in [place], from List Provided, in An Effort to Quit Smoking. Encouraged Continued Consideration of Higher Level of Care Placement Options in [facility] (I.e Assisted Living, Extended Care, Intermediate Level Care, Etc.), from List Provided. Confirmed Referral Placed to Meals on Wheels, with Aging, Disability, & Transit Services of [place] [phone]), by Lenard Forth, Population Health Care Guide with [facility] [phone]), on 12/28/2022, in An Effort to Establish Prepared Meal Delivery Services in The Home. Confirmed Receipt & Thoroughly Reviewed the Following List of [employer], Nurse, adult, Resources, Occupational psychologist in [place], to [employer] & Entertain  Questions: ~ Adult Day Care Programs  ~ In-Home Care & Respite Agencies ~ Home Health Care Agencies ~ Respite Care Agencies & Facilities ~ Merchant navy officer  ~ Theatre manager Providers Encouraged Solicitor with [employer], Nurse, adult, & Resources of Interest in [place], from List Provided, in An Effort to Obtain Care & Supervision in The Home [employer]. Encouraged Completion of Application for Personal Care Services, through [facility] [phone]), & Submission to Dr. Mechele Claude, Primary Care Provider with [facility] [phone]), for Review & Signature. Encouraged Utilization of Free Armed forces operational officer, through [employer] [phone]), for Transport To & From Physician Appointments & Provider Practice Visits. Encouraged Utilization of Free Armed forces operational officer, through [employer] [phone]), for Transport To & From Physician Appointments & Provider Practice Visits. Encouraged Contact with CSW (# [phone]), if You Have Questions, Need Assistance, or If Additional Social Work Needs Are Identified Between Now & Our Next Scheduled Follow-Up Outreach Call.      SDOH assessments and interventions completed:  Yes.  Care Coordination Interventions:  Yes, provided.   Follow up plan: Follow up call scheduled for 01/29/2023 at 1:15 pm.   Encounter Outcome:  Pt. Visit Completed.   [name], BSW, MSW, LCSW  Licensed Restaurant manager, fast food [facility]  Mailing [place] [address] [place], [place] [address] Physical Address-[address] [place], [place] [address] Toll Free Main # [phone] Fax # [phone] Cell # [phone] [email or web address]

## 2023-01-07 DIAGNOSIS — J449 Chronic obstructive pulmonary disease, unspecified: Secondary | ICD-10-CM | POA: Diagnosis not present

## 2023-01-08 ENCOUNTER — Ambulatory Visit (INDEPENDENT_AMBULATORY_CARE_PROVIDER_SITE_OTHER): Payer: 59

## 2023-01-08 ENCOUNTER — Telehealth: Payer: Self-pay

## 2023-01-08 ENCOUNTER — Ambulatory Visit: Payer: Self-pay | Admitting: *Deleted

## 2023-01-08 DIAGNOSIS — R634 Abnormal weight loss: Secondary | ICD-10-CM

## 2023-01-08 DIAGNOSIS — I509 Heart failure, unspecified: Secondary | ICD-10-CM | POA: Diagnosis not present

## 2023-01-08 DIAGNOSIS — K219 Gastro-esophageal reflux disease without esophagitis: Secondary | ICD-10-CM

## 2023-01-08 DIAGNOSIS — Q824 Ectodermal dysplasia (anhidrotic): Secondary | ICD-10-CM | POA: Diagnosis not present

## 2023-01-08 DIAGNOSIS — M1990 Unspecified osteoarthritis, unspecified site: Secondary | ICD-10-CM

## 2023-01-08 DIAGNOSIS — K759 Inflammatory liver disease, unspecified: Secondary | ICD-10-CM

## 2023-01-08 DIAGNOSIS — J4489 Other specified chronic obstructive pulmonary disease: Secondary | ICD-10-CM

## 2023-01-08 DIAGNOSIS — M109 Gout, unspecified: Secondary | ICD-10-CM | POA: Diagnosis not present

## 2023-01-08 DIAGNOSIS — I110 Hypertensive heart disease with heart failure: Secondary | ICD-10-CM | POA: Diagnosis not present

## 2023-01-08 DIAGNOSIS — Z87891 Personal history of nicotine dependence: Secondary | ICD-10-CM | POA: Diagnosis not present

## 2023-01-08 DIAGNOSIS — Z9181 History of falling: Secondary | ICD-10-CM | POA: Diagnosis not present

## 2023-01-08 DIAGNOSIS — Z7982 Long term (current) use of aspirin: Secondary | ICD-10-CM | POA: Diagnosis not present

## 2023-01-08 DIAGNOSIS — Z7902 Long term (current) use of antithrombotics/antiplatelets: Secondary | ICD-10-CM | POA: Diagnosis not present

## 2023-01-08 DIAGNOSIS — Z8673 Personal history of transient ischemic attack (TIA), and cerebral infarction without residual deficits: Secondary | ICD-10-CM | POA: Diagnosis not present

## 2023-01-08 DIAGNOSIS — Z7951 Long term (current) use of inhaled steroids: Secondary | ICD-10-CM | POA: Diagnosis not present

## 2023-01-08 NOTE — Telephone Encounter (Signed)
   Telephone encounter was:  Successful.  01/08/2023 Name: [name] MRN: [id number] DOB: [birth year]  [name] is a 82 y.o. year old female who is a primary care patient of Stacks, Broadus John, MD . The community resource team was consulted for assistance with Food Insecurity  Care guide performed the following interventions: Patient provided with information about care guide support team and interviewed to confirm resource needs. Patient has been added to the meals on wheels waiting list. There is a 9 month waiting for meals at this time  Follow Up Plan:  No further follow up planned at this time. The patient has been provided with needed resources.    Lenard Forth [facility] Guide, [facility] [phone] [address] [place], [place], [place] [address] Phone: [phone] Email: [email or web address]

## 2023-01-08 NOTE — Patient Outreach (Signed)
  Care Coordination   Follow Up Visit Note   01/08/2023 Name: [name] MRN: [id number] DOB: 10-Aug-1940  [name] is a 82 y.o. year old female who sees Stacks, Broadus John, MD for primary care. I spoke with  [name] by phone today.  What matters to the patients health and wellness today?  Pt called to inquire about PCS services.            Care Coordination Interventions:  Yes, provided  Interventions Today    Flowsheet Row Most Recent Value  Chronic Disease   Chronic disease during today's visit Chronic Obstructive Pulmonary Disease (COPD)  General Interventions   General Interventions Discussed/Reviewed Level of Care  Level of Care Personal Care Services  [Pt called to inquire about PCS services. This CSW explained the process for PCS application completion and submission. Pt requesting copy of application be mailed to her- will do so today.]  Applications Personal Care Services  Education Interventions   Applications Personal Care Services  Mental Health Interventions   Mental Health Discussed/Reviewed Mental Health Discussed, Coping Strategies       Follow up plan: No further intervention required. Follow up call scheduled for [name] previously arranged.    Encounter Outcome:  Pt. Visit Completed

## 2023-01-11 ENCOUNTER — Encounter: Payer: Self-pay | Admitting: Family Medicine

## 2023-01-11 DIAGNOSIS — I25118 Atherosclerotic heart disease of native coronary artery with other forms of angina pectoris: Secondary | ICD-10-CM | POA: Diagnosis not present

## 2023-01-11 DIAGNOSIS — E785 Hyperlipidemia, unspecified: Secondary | ICD-10-CM | POA: Diagnosis not present

## 2023-01-14 DIAGNOSIS — J4489 Other specified chronic obstructive pulmonary disease: Secondary | ICD-10-CM | POA: Diagnosis not present

## 2023-01-14 DIAGNOSIS — K219 Gastro-esophageal reflux disease without esophagitis: Secondary | ICD-10-CM | POA: Diagnosis not present

## 2023-01-14 DIAGNOSIS — I509 Heart failure, unspecified: Secondary | ICD-10-CM | POA: Diagnosis not present

## 2023-01-14 DIAGNOSIS — K759 Inflammatory liver disease, unspecified: Secondary | ICD-10-CM | POA: Diagnosis not present

## 2023-01-14 DIAGNOSIS — Z8673 Personal history of transient ischemic attack (TIA), and cerebral infarction without residual deficits: Secondary | ICD-10-CM | POA: Diagnosis not present

## 2023-01-14 DIAGNOSIS — Z9181 History of falling: Secondary | ICD-10-CM | POA: Diagnosis not present

## 2023-01-14 DIAGNOSIS — Q824 Ectodermal dysplasia (anhidrotic): Secondary | ICD-10-CM | POA: Diagnosis not present

## 2023-01-14 DIAGNOSIS — M1990 Unspecified osteoarthritis, unspecified site: Secondary | ICD-10-CM | POA: Diagnosis not present

## 2023-01-14 DIAGNOSIS — Z87891 Personal history of nicotine dependence: Secondary | ICD-10-CM | POA: Diagnosis not present

## 2023-01-14 DIAGNOSIS — Z7902 Long term (current) use of antithrombotics/antiplatelets: Secondary | ICD-10-CM | POA: Diagnosis not present

## 2023-01-14 DIAGNOSIS — Z7982 Long term (current) use of aspirin: Secondary | ICD-10-CM | POA: Diagnosis not present

## 2023-01-14 DIAGNOSIS — R634 Abnormal weight loss: Secondary | ICD-10-CM | POA: Diagnosis not present

## 2023-01-14 DIAGNOSIS — Z7951 Long term (current) use of inhaled steroids: Secondary | ICD-10-CM | POA: Diagnosis not present

## 2023-01-14 DIAGNOSIS — M109 Gout, unspecified: Secondary | ICD-10-CM | POA: Diagnosis not present

## 2023-01-14 DIAGNOSIS — I110 Hypertensive heart disease with heart failure: Secondary | ICD-10-CM | POA: Diagnosis not present

## 2023-01-17 ENCOUNTER — Encounter: Payer: Self-pay | Admitting: Student

## 2023-01-17 ENCOUNTER — Ambulatory Visit: Payer: 59 | Admitting: Family Medicine

## 2023-01-17 ENCOUNTER — Ambulatory Visit: Payer: 59 | Attending: Cardiovascular Disease | Admitting: Student

## 2023-01-17 ENCOUNTER — Telehealth: Payer: Self-pay | Admitting: Pharmacist

## 2023-01-17 DIAGNOSIS — E785 Hyperlipidemia, unspecified: Secondary | ICD-10-CM

## 2023-01-17 NOTE — Progress Notes (Addendum)
Patient ID: EUA LUNT                 DOB: Aug 23, 1940                    MRN: [id number]      HPI: [name] is a 82 y.o. female patient referred to lipid clinic by Lily Kocher. PMH is significant for HTN, PAD, Left renal artery stenosis,CAD with stable angina calcium score 396 (85 th percentile)HLD, hx of CVA, COPD, CKD stage 3, GERD  Patient was on Lipitor 80 mg and at 10/15/2022 visit LDLc -99 mg/dl -was not at goal so Zetia was added to high intensity statins, LFT was WNL - lab done 10/30/2022. Recent lipid lab LDL 109 mg/dl. Will assess  adherence  to current therapy.   Patient presented today for lipid clinic. Reports she has been taking her medication regularly including her lipid meds. She uses pill box to track adherence. She eats homes cooke meal which are low in fat and carb. She plays lottery and the store is exactly 1 mile from her house she walks to the store and back every day.  We reviewed options for lowering LDL cholesterol, including PCSK-9 inhibitors, bempedoic acid and inclisiran.  Discussed mechanisms of action, dosing, side effects and potential decreases in LDL cholesterol.  Also reviewed cost information and potential options for patient assistance.  Current Medications: Lipitor 80 mg and Zetia 10 mg  Intolerances: none  Risk Factors: CAD with stable angina calcium score 396 (85 th percentile)HLD, hx of CVA, CKD stage 3, GERD LDL goal: <70   Diet: low sodium, low fat home cooked meals diet    Exercise: walking -2 miles walking and physical therapy twice a week   Family History:  3 children with had stent  She was adopted so doesn't have parents hx    Social History:  Alcohol: none  Smoking: none  Labs: Lipid Panel     Component Value Date/Time   CHOL 196 12/31/2022 0928   TRIG 63 12/31/2022 0928   HDL 75 12/31/2022 0928   CHOLHDL 2.6 12/31/2022 0928   CHOLHDL 2.6 01/24/2020 0706   VLDL 6 01/24/2020 0706   LDLCALC 109 (H) 12/31/2022  0928   LABVLDL 12 12/31/2022 0928    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 180 tablet PRN   albuterol (VENTOLIN HFA) 108 (90 Base) MCG/ACT inhaler Inhale 2 puffs into the lungs every 6 (six) hours as needed for wheezing or shortness of breath. 1 each 11   amLODipine (NORVASC) 10 MG tablet Take 1 tablet (10 mg total) by mouth daily. 90 tablet 3   arformoterol (BROVANA) 15 MCG/2ML NEBU Take 2 mLs (15 mcg total) by nebulization 2 (two) times daily. 360 mL 3   atorvastatin (LIPITOR) 80 MG tablet Take  1 tablet (80 mg total) by mouth daily. 90 tablet 3   budesonide (PULMICORT) 0.5 MG/2ML nebulizer solution Take 2 mLs (0.5 mg total) by nebulization 2 (two) times daily. 360 mL 3   budesonide-formoterol (SYMBICORT) 160-4.5 MCG/ACT inhaler Inhale 2 puffs into the lungs 2 (two) times daily. 1 each 5   clopidogrel (PLAVIX) 75 MG tablet Take 1 tablet (75 mg total) by mouth daily. 90 tablet 3   cyclobenzaprine (FLEXERIL) 5 MG tablet TAKE ONE TABLET THREE TIMES DAILY AS NEEDED FOR MUSCLE SPASMS (Patient taking differently: Take 5 mg by mouth 3 (three) times daily as needed for muscle spasms.) 30 tablet 5   ezetimibe (ZETIA) 10 MG tablet Take 1 tablet (10 mg total) by mouth daily. 90 tablet 3   furosemide (LASIX) 40 MG tablet Take 1 tablet (40 mg total) by mouth daily. For swelling 30 tablet 5   hydrALAZINE (APRESOLINE) 50 MG  tablet Take 1 tablet (50 mg total) by mouth 3 (three) times daily. 270 tablet 3   ipratropium-albuterol (DUONEB) 0.5-2.5 (3) MG/3ML SOLN Take 3 mLs by nebulization in the morning and at bedtime. 540 mL 3   isosorbide dinitrate (ISORDIL) 20 MG tablet Take 1 tablet (20 mg total) by mouth 3 (three) times daily. 90 tablet 2   metoprolol succinate (TOPROL-XL) 50 MG 24 hr tablet Take 1 tablet (50 mg total) by mouth daily. For blood pressure control 30 tablet 2   olmesartan (BENICAR) 40 MG tablet Take 1 tablet (40 mg total) by mouth daily. For blood pressure 90 tablet 1   potassium chloride SA (KLOR-CON M) 20 MEQ tablet Take 1 tablet (20 mEq total) by mouth daily. For potassium replacement/ supplement 30 tablet 5   vitamin B-12 1000 MCG tablet Take 1 tablet (1,000 mcg total) by mouth daily. 30 tablet 2   Vitamin D, Ergocalciferol, (DRISDOL) 1.25 MG (50000 UNIT) CAPS capsule Take 1 capsule (50,000 Units total) by mouth every 7 (seven) days. 13 capsule 3   No current facility-administered medications on file prior to visit.    Allergies  Allergen Reactions   Penicillins Anaphylaxis, Swelling, Rash and Other (See Comments)    Has patient had a PCN reaction causing immediate rash, facial/tongue/throat swelling, SOB or lightheadedness with hypotension: yes Has patient had a PCN reaction causing severe rash involving mucus membranes or skin necrosis: no Has patient had a PCN reaction that required hospitalization: yes Has patient had a PCN reaction occurring within the last 10 years: no If all of the above answers are "NO", then may proceed with Cephalosporin use.     Assessment/Plan:  1. Hyperlipidemia -  Problem  Hyperlipidemia   Hyperlipidemia Assessment:  LDL goal: < 70 mg/dl last LDLc 161 mg/dl (09/60/4540) Takes and tolerates Zetia and high intensity statins well without any side effects however insufficient to lower LDLc to goal Follow low salt, low fat diet and walks 2 miles everyday   Discussed next potential options (PCSK-9 inhibitors, bempedoic acid and inclisiran); cost, dosing efficacy, side effects   Plan: Continue taking current medications - Lipitor 80 mg and Zetia 10 mg  Will apply for PA for PCSK9i; will inform patient upon approval Lipid lab due in 2-3 months after starting PCSK9i    Thank you,  Carmela Hurt, Pharm.D [facility] A Division of [facility] [address] [place], [place] [address]  Phone: [phone]; Fax: [phone]

## 2023-01-17 NOTE — Patient Instructions (Signed)
Your Results:             Your most recent labs Goal  Total Cholesterol 196 < 200  Triglycerides 63 < 150  HDL (happy/good cholesterol) 75 > 40  LDL (lousy/bad cholesterol 109 < 70   Medication changes: We will start the process to get PCSK9i (Repatha or Praluent) covered by your insurance.  Once the prior authorization is complete, we will call you to let you know and confirm pharmacy information.      Praluent is a cholesterol medication that improved your body's ability to get rid of "bad cholesterol" known as LDL. It can lower your LDL up to 60%. It is an injection that is given under the skin every 2 weeks. The most common side effects of Praluent include runny nose, symptoms of the common cold, rarely flu or flu-like symptoms, back/muscle pain in about 3-4% of the patients, and redness, pain, or bruising at the injection site.    Repatha is a cholesterol medication that improved your body's ability to get rid of "bad cholesterol" known as LDL. It can lower your LDL up to 60%! It is an injection that is given under the skin every 2 weeks. The most common side effects of Repatha include runny nose, symptoms of the common cold, rarely flu or flu-like symptoms, back/muscle pain in about 3-4% of the patients, and redness, pain, or bruising at the injection site.   Lab orders: We want to repeat labs after 2-3 months.  We will send you a lab order to remind you once we get closer to that time.

## 2023-01-17 NOTE — Assessment & Plan Note (Signed)
Assessment:  LDL goal: < 70 mg/dl last LDLc 161 mg/dl (09/60/4540) Takes and tolerates Zetia and high intensity statins well without any side effects however insufficient to lower LDLc to goal Follow low salt, low fat diet and walks 2 miles everyday  Discussed next potential options (PCSK-9 inhibitors, bempedoic acid and inclisiran); cost, dosing efficacy, side effects   Plan: Continue taking current medications - Lipitor 80 mg and Zetia 10 mg  Will apply for PA for PCSK9i; will inform patient upon approval Lipid lab due in 2-3 months after starting [facility]

## 2023-01-21 ENCOUNTER — Other Ambulatory Visit: Payer: 59

## 2023-01-21 ENCOUNTER — Ambulatory Visit: Payer: 59 | Admitting: Family Medicine

## 2023-01-22 ENCOUNTER — Telehealth: Payer: Self-pay

## 2023-01-22 ENCOUNTER — Other Ambulatory Visit (HOSPITAL_COMMUNITY): Payer: Self-pay

## 2023-01-22 NOTE — Telephone Encounter (Signed)
PA requested for PCSK9i.

## 2023-01-22 NOTE — Telephone Encounter (Signed)
Pharmacy Patient Advocate Encounter   Received notification from Physician's Office/PHARMD PATEL that prior authorization for REPATHA is required/requested.   Insurance verification completed.   The patient is insured through [facility] .   Per test claim: PA required; PA submitted to [facility] via CoverMyMeds Key/confirmation #/EOC [id number]  Status is pending

## 2023-01-23 ENCOUNTER — Encounter: Payer: Self-pay | Admitting: Family Medicine

## 2023-01-23 ENCOUNTER — Ambulatory Visit (INDEPENDENT_AMBULATORY_CARE_PROVIDER_SITE_OTHER): Payer: 59

## 2023-01-23 ENCOUNTER — Ambulatory Visit (INDEPENDENT_AMBULATORY_CARE_PROVIDER_SITE_OTHER): Payer: 59 | Admitting: Family Medicine

## 2023-01-23 VITALS — BP 181/68 | HR 71 | Temp 97.4°F | Ht 61.25 in | Wt 135.2 lb

## 2023-01-23 DIAGNOSIS — R634 Abnormal weight loss: Secondary | ICD-10-CM | POA: Diagnosis not present

## 2023-01-23 DIAGNOSIS — M1990 Unspecified osteoarthritis, unspecified site: Secondary | ICD-10-CM | POA: Diagnosis not present

## 2023-01-23 DIAGNOSIS — Z7982 Long term (current) use of aspirin: Secondary | ICD-10-CM | POA: Diagnosis not present

## 2023-01-23 DIAGNOSIS — I110 Hypertensive heart disease with heart failure: Secondary | ICD-10-CM | POA: Diagnosis not present

## 2023-01-23 DIAGNOSIS — J4489 Other specified chronic obstructive pulmonary disease: Secondary | ICD-10-CM

## 2023-01-23 DIAGNOSIS — Z780 Asymptomatic menopausal state: Secondary | ICD-10-CM

## 2023-01-23 DIAGNOSIS — Z7951 Long term (current) use of inhaled steroids: Secondary | ICD-10-CM | POA: Diagnosis not present

## 2023-01-23 DIAGNOSIS — I509 Heart failure, unspecified: Secondary | ICD-10-CM | POA: Diagnosis not present

## 2023-01-23 DIAGNOSIS — I1A0 Resistant hypertension: Secondary | ICD-10-CM | POA: Diagnosis not present

## 2023-01-23 DIAGNOSIS — Z9181 History of falling: Secondary | ICD-10-CM | POA: Diagnosis not present

## 2023-01-23 DIAGNOSIS — K759 Inflammatory liver disease, unspecified: Secondary | ICD-10-CM | POA: Diagnosis not present

## 2023-01-23 DIAGNOSIS — Z87891 Personal history of nicotine dependence: Secondary | ICD-10-CM | POA: Diagnosis not present

## 2023-01-23 DIAGNOSIS — Z8673 Personal history of transient ischemic attack (TIA), and cerebral infarction without residual deficits: Secondary | ICD-10-CM | POA: Diagnosis not present

## 2023-01-23 DIAGNOSIS — Q824 Ectodermal dysplasia (anhidrotic): Secondary | ICD-10-CM | POA: Diagnosis not present

## 2023-01-23 DIAGNOSIS — M109 Gout, unspecified: Secondary | ICD-10-CM | POA: Diagnosis not present

## 2023-01-23 DIAGNOSIS — K219 Gastro-esophageal reflux disease without esophagitis: Secondary | ICD-10-CM | POA: Diagnosis not present

## 2023-01-23 DIAGNOSIS — Z7902 Long term (current) use of antithrombotics/antiplatelets: Secondary | ICD-10-CM | POA: Diagnosis not present

## 2023-01-23 MED ORDER — CLONIDINE HCL 0.1 MG PO TABS: 0.1000 mg | ORAL_TABLET | Freq: Two times a day (BID) | ORAL | 5 refills | Status: DC

## 2023-01-23 NOTE — Progress Notes (Signed)
Subjective:  Patient ID: [name], female    DOB: 16-Dec-1940  Age: 82 y.o. MRN: [id number]  CC: Follow-up   HPI [name] presents for recheck of COPD, CHF & resistant HTN. Now doing much better. Breathing well using inhaler and neb regimen as below.      01/23/2023   10:59 AM 01/23/2023   10:53 AM 12/24/2022   12:22 PM  Depression screen PHQ 2/9  Decreased Interest 0 0 1  Down, Depressed, Hopeless 0 0 1  PHQ - 2 Score 0 0 2  Altered sleeping 2  1  Tired, decreased energy 2  1  Change in appetite 0  1  Feeling bad or failure about yourself  0  0  Trouble concentrating 2  1  Moving slowly or fidgety/restless 0  1  Suicidal thoughts 0  0  PHQ-9 Score 6  7  Difficult doing work/chores Very difficult  Very difficult    History [name] has a past medical history of Arthritis, Atypical chest pain (03/19/2022), GERD (gastroesophageal reflux disease), Headache(784.0), Hepatitis, History of gout, Hypertension, Hypertensive encephalopathy, 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   She has a past surgical history that includes Abdominal hysterectomy; Anterior cervical corpectomy (03/07/2012); Tonsillectomy; Multiple tooth extractions; Cataract extraction w/PHACO (Left, 04/12/2017); and Cataract extraction w/PHACO (Right, 05/03/2017).   Her family history includes Asthma in her daughter; Bipolar disorder in her daughter, daughter, daughter, daughter, and son; Drug abuse in her daughter; Heart disease in her daughter and daughter; Hypertension in her daughter. She was adopted.She reports that she quit smoking about 10 months ago. Her smoking use included cigarettes. She started smoking about 62 years ago. She has a 30.9 pack-year smoking history. She has been exposed to tobacco smoke. She has never used smokeless tobacco. She reports current alcohol use. She reports that she does not use  drugs.    ROS Review of Systems  Constitutional: Negative.   HENT: Negative.    Eyes:  Negative for visual disturbance.  Respiratory:  Negative for shortness of breath.   Cardiovascular:  Negative for chest pain.  Gastrointestinal:  Negative for abdominal pain.  Musculoskeletal:  Negative for arthralgias.    Objective:  BP (!) 181/68   Pulse 71   Temp (!) 97.4 F (36.3 C)   Ht 5' 1.25" (1.556 m)   Wt 135 lb 3.2 oz (61.3 kg)   SpO2 97%   BMI 25.34 kg/m   BP Readings from Last 3 Encounters:  01/23/23 (!) 181/68  01/02/23 138/84  12/31/22 130/68    Wt Readings from Last 3 Encounters:  01/23/23 135 lb 3.2 oz (61.3 kg)  01/02/23 139 lb 12.8 oz (63.4 kg)  12/31/22 139 lb (63 kg)     Physical Exam Constitutional:      General: She is not in acute distress.    Appearance: She is well-developed.  Cardiovascular:     Rate and Rhythm: Normal rate and regular rhythm.  Pulmonary:     Breath sounds: Normal breath sounds.  Musculoskeletal:        General: Normal range of motion.  Skin:    General: Skin is warm and dry.  Neurological:     Mental Status: She is alert and oriented to person, place, and time.       Assessment & Plan:   [name] was seen today for follow-up.  Diagnoses and all orders for this visit:  COPD with chronic bronchitis  Resistant  hypertension  Other orders -     cloNIDine (CATAPRES) 0.1 MG tablet; Take 1 tablet (0.1 mg total) by mouth 2 (two) times daily. For Blood Pressure       I have discontinued [name]'s hydrALAZINE. I am also having her start on cloNIDine. Additionally, I am having her maintain her atorvastatin, cyclobenzaprine, amLODipine, isosorbide dinitrate, cyanocobalamin, ezetimibe, budesonide-formoterol, albuterol, acetaminophen, Vitamin D (Ergocalciferol), potassium chloride SA, furosemide, metoprolol succinate, olmesartan, clopidogrel, budesonide, arformoterol, and ipratropium-albuterol.  Allergies as of  01/23/2023       Reactions   Penicillins Anaphylaxis, Swelling, Rash, Other (See Comments)   Has patient had a PCN reaction causing immediate rash, facial/tongue/throat swelling, SOB or lightheadedness with hypotension: yes Has patient had a PCN reaction causing severe rash involving mucus membranes or skin necrosis: no Has patient had a PCN reaction that required hospitalization: yes Has patient had a PCN reaction occurring within the last 10 years: no If all of the above answers are "NO", then may proceed with Cephalosporin use.        Medication List        Accurate as of January 23, 2023  5:39 PM. If you have any questions, ask your nurse or doctor.          STOP taking these medications    hydrALAZINE 50 MG tablet Commonly known as: APRESOLINE Stopped by: [name]       TAKE these medications    acetaminophen 500 MG tablet Commonly known as: TYLENOL Take 2 tablets (1,000 mg total) by mouth 3 (three) times daily.   albuterol 108 (90 Base) MCG/ACT inhaler Commonly known as: VENTOLIN HFA Inhale 2 puffs into the lungs every 6 (six) hours as needed for wheezing or shortness of breath.   amLODipine 10 MG tablet Commonly known as: NORVASC Take 1 tablet (10 mg total) by mouth daily.   arformoterol 15 MCG/2ML Nebu Commonly known as: BROVANA Take 2 mLs (15 mcg total) by nebulization 2 (two) times daily.   atorvastatin 80 MG tablet Commonly known as: LIPITOR Take 1 tablet (80 mg total) by mouth daily.   budesonide 0.5 MG/2ML nebulizer solution Commonly known as: PULMICORT Take 2 mLs (0.5 mg total) by nebulization 2 (two) times daily.   budesonide-formoterol 160-4.5 MCG/ACT inhaler Commonly known as: SYMBICORT Inhale 2 puffs into the lungs 2 (two) times daily.   cloNIDine 0.1 MG tablet Commonly known as: CATAPRES Take 1 tablet (0.1 mg total) by mouth 2 (two) times daily. For Blood Pressure Started by: [name]   clopidogrel 75 MG tablet Commonly  known as: PLAVIX Take 1 tablet (75 mg total) by mouth daily.   cyanocobalamin 1000 MCG tablet Take 1 tablet (1,000 mcg total) by mouth daily.   cyclobenzaprine 5 MG tablet Commonly known as: FLEXERIL TAKE ONE TABLET THREE TIMES DAILY AS NEEDED FOR MUSCLE SPASMS What changed: See the new instructions.   ezetimibe 10 MG tablet Commonly known as: ZETIA Take 1 tablet (10 mg total) by mouth daily.   furosemide 40 MG tablet Commonly known as: LASIX Take 1 tablet (40 mg total) by mouth daily. For swelling   ipratropium-albuterol 0.5-2.5 (3) MG/3ML Soln Commonly known as: DUONEB Take 3 mLs by nebulization in the morning and at bedtime.   isosorbide dinitrate 20 MG tablet Commonly known as: ISORDIL Take 1 tablet (20 mg total) by mouth 3 (three) times daily.   metoprolol succinate 50 MG 24 hr tablet Commonly known as: TOPROL-XL Take 1 tablet (50 mg total)  by mouth daily. For blood pressure control   olmesartan 40 MG tablet Commonly known as: Benicar Take 1 tablet (40 mg total) by mouth daily. For blood pressure   potassium chloride SA 20 MEQ tablet Commonly known as: KLOR-CON M Take 1 tablet (20 mEq total) by mouth daily. For potassium replacement/ supplement   Vitamin D (Ergocalciferol) 1.25 MG (50000 UNIT) Caps capsule Commonly known as: DRISDOL Take 1 capsule (50,000 Units total) by mouth every 7 (seven) days.         Follow-up: Return in about 1 month (around 02/23/2023).  Mechele Claude, M.D.

## 2023-01-24 DIAGNOSIS — M8589 Other specified disorders of bone density and structure, multiple sites: Secondary | ICD-10-CM | POA: Diagnosis not present

## 2023-01-24 DIAGNOSIS — Z780 Asymptomatic menopausal state: Secondary | ICD-10-CM | POA: Diagnosis not present

## 2023-01-25 ENCOUNTER — Other Ambulatory Visit: Payer: 59 | Admitting: *Deleted

## 2023-01-25 ENCOUNTER — Other Ambulatory Visit (HOSPITAL_COMMUNITY): Payer: Self-pay

## 2023-01-25 ENCOUNTER — Encounter: Payer: Self-pay | Admitting: *Deleted

## 2023-01-25 ENCOUNTER — Telehealth: Payer: 59

## 2023-01-25 MED ORDER — REPATHA SURECLICK 140 MG/ML ~~LOC~~ SOAJ: 140.0000 mg | SUBCUTANEOUS | 3 refills | Status: DC

## 2023-01-25 NOTE — Telephone Encounter (Signed)
Pharmacy Patient Advocate Encounter  Received notification from [facility] that Prior Authorization for REPATHA has been APPROVED to 2.27.25  Per Test Claim:  Repatha  65ml-30 day supply is a cost of 0.00

## 2023-01-25 NOTE — Patient Outreach (Signed)
Care Management   Visit Note  01/25/2023 Name: CHARDAI OVERCASH MRN: [id number] DOB: 1941-01-16  Subjective: CHRISANNE PINKERT is a 82 y.o. year old female who is a primary care patient of Mechele Claude, MD. The Care Management team was consulted for assistance.      Engaged with patient spoke with patient by telephone for follow up   Goals Addressed             This Visit's Progress    CCM (CONGESTIVE HEART FAILURE) EXPECTED OUTCOME: MONITOR, SELF-MANAGE AND REDUCE SYMPTOMS OF CONGESTIVE HEART FAILURE       Current Barriers:  Knowledge Deficits related to Congestive Heart Failure management Care Coordination needs related to application for PCS/ assistance in the home in a patient with CHF Chronic Disease Management support and education needs related to Congestive Heart Failure, diet No Advanced Directives in place- pt requests information Patient reports she lives with significant other [name] (he is going through his own health issues at present), pt reports she is overall independent but is starting to feel like she is having issues with bathing, sometimes her partner has to assist her, pt does not use any assistive devices, states she would consider a shower seat and this may be helpful. Patient states she plans to apply for PCS through medicaid benefit with assistance of Child psychotherapist.  Patient reports she quit smoking 4 months ago and has done well with this, pt uses [facility] transportation benefit for most all transportation needs. Patient reports she weighs daily with recent weight 132-135 pounds Patient reports she checks blood pressure daily , states "it's been good" Patient reports " someone is supposed to come to my home today about getting help in the home"  Planned Interventions: Discussed importance of daily weight and advised patient to weigh and record daily Reviewed role of diuretics in prevention of fluid overload and management of heart  failure Discussed the importance of keeping all appointments with provider Reinforced low sodium diet Reinforced Heart Failure action plan  Symptom Management: Take medications as prescribed   Attend all scheduled provider appointments Call pharmacy for medication refills 3-7 days in advance of running out of medications Attend church or other social activities Call provider office for new concerns or questions  Work with the social worker to address care coordination needs and will continue to work with the clinical team to address health care and disease management related needs call office if I gain more than 2 pounds in one day or 5 pounds in one week keep legs up while sitting track weight in diary use salt in moderation watch for swelling in feet, ankles and legs every day weigh myself daily eat more whole grains, fruits and vegetables, lean meats and healthy fats dress right for the weather, hot or cold Follow heart failure action plan  Follow Up Plan: Telephone follow up appointment with care management team member scheduled for:  04/11/23 at 3 pm       CCM (CVA) EXPECTED OUTCOME: MONITOR, SELF-MANAGE AND REDUCE SYMPTOMS OF CVA       Current Barriers:  Knowledge Deficits related to CVA management Chronic Disease Management support and education needs related to CVA No Advanced Directives in place- documents previously mailed Patient 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 and states " they think I had a light stroke when I went to ED on 7/12"  pt reports her blood pressure "was very high" Patient reports she does have  some residual left sided weakness Patient is drinking Ensure TID and eats at least 2 meals per day Patient stopped smoking 4 months ago No new concerns reported today  Planned Interventions: Reviewed Importance of taking all medications as prescribed Reviewed Importance of attending all scheduled provider appointments Advised to report any changes in  symptoms or exercise tolerance Screening for signs and symptoms of depression related to chronic disease state;  Assessed social determinant of health barriers Assessed for signs and symptoms of stroke Reviewed the importance of exercise Assessed for cognitive impairment Assessed for fall status and safety in the home Assessed use of tobacco use Reinforced importance of keeping blood pressure under good control for stroke prevention and overall healthy Encouragement given for smoking cessation  Symptom Management: Take medications as prescribed   Attend all scheduled provider appointments Call pharmacy for medication refills 3-7 days in advance of running out of medications Attend church or other social activities Call provider office for new concerns or questions  call the Suicide and Crisis Lifeline: 988 call the [place] National Suicide Prevention Lifeline: (302)668-5837 or TTY: 321-001-0679 TTY 321-633-1480) to talk to a trained counselor call 1-800-273-TALK (toll free, 24 hour hotline) go to [facility] [address] [place] [id number]) call the [facility] Crisis Line: (804)317-9477 call 911 if experiencing a Mental Health or Behavioral Health Crisis   Follow Up Plan: Telephone follow up appointment with care management team member scheduled for:  04/11/23 at 3 pm           Plan: Telephone follow up appointment with care management team member scheduled for:  04/11/23 at 3 pm  Irving Shows [facility], BSN [place]/ Ambulatory Care Management [phone]

## 2023-01-25 NOTE — Patient Instructions (Signed)
Visit Information  Thank you for taking time to visit with me today. Please don't hesitate to contact me if I can be of assistance to you before our next scheduled telephone appointment.  Following are the goals we discussed today:   Goals Addressed             This Visit's Progress    CCM (CONGESTIVE HEART FAILURE) EXPECTED OUTCOME: MONITOR, SELF-MANAGE AND REDUCE SYMPTOMS OF CONGESTIVE HEART FAILURE       Current Barriers:  Knowledge Deficits related to Congestive Heart Failure management Care Coordination needs related to application for PCS/ assistance in the home in a patient with CHF Chronic Disease Management support and education needs related to Congestive Heart Failure, diet No Advanced Directives in place- pt requests information Patient reports she lives with significant other [name] (he is going through his own health issues at present), pt reports she is overall independent but is starting to feel like she is having issues with bathing, sometimes her partner has to assist her, pt does not use any assistive devices, states she would consider a shower seat and this may be helpful. Patient states she plans to apply for PCS through medicaid benefit with assistance of Child psychotherapist.  Patient reports she quit smoking 4 months ago and has done well with this, pt uses [facility] transportation benefit for most all transportation needs. Patient reports she weighs daily with recent weight 132-135 pounds Patient reports she checks blood pressure daily , states "it's been good" Patient reports " someone is supposed to come to my home today about getting help in the home"  Planned Interventions: Discussed importance of daily weight and advised patient to weigh and record daily Reviewed role of diuretics in prevention of fluid overload and management of heart failure Discussed the importance of keeping all appointments with provider Reinforced low sodium diet Reinforced  Heart Failure action plan  Symptom Management: Take medications as prescribed   Attend all scheduled provider appointments Call pharmacy for medication refills 3-7 days in advance of running out of medications Attend church or other social activities Call provider office for new concerns or questions  Work with the social worker to address care coordination needs and will continue to work with the clinical team to address health care and disease management related needs call office if I gain more than 2 pounds in one day or 5 pounds in one week keep legs up while sitting track weight in diary use salt in moderation watch for swelling in feet, ankles and legs every day weigh myself daily eat more whole grains, fruits and vegetables, lean meats and healthy fats dress right for the weather, hot or cold Follow heart failure action plan  Follow Up Plan: Telephone follow up appointment with care management team member scheduled for:  04/11/23 at 3 pm       CCM (CVA) EXPECTED OUTCOME: MONITOR, SELF-MANAGE AND REDUCE SYMPTOMS OF CVA       Current Barriers:  Knowledge Deficits related to CVA management Chronic Disease Management support and education needs related to CVA No Advanced Directives in place- documents previously mailed Patient 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 and states " they think I had a light stroke when I went to ED on 7/12"  pt reports her blood pressure "was very high" Patient reports 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 Patient is drinking Ensure TID and eats at least 2 meals per day Patient stopped smoking  4 months ago No new concerns reported today  Planned Interventions: Reviewed Importance of taking all medications as prescribed Reviewed Importance of attending all scheduled provider appointments Advised to report any changes in symptoms or exercise tolerance Screening for signs and symptoms of depression related to chronic disease state;   Assessed social determinant of health barriers Assessed for signs and symptoms of stroke Reviewed the importance of exercise Assessed for cognitive impairment Assessed for fall status and safety in the home Assessed use of tobacco use Reinforced importance of keeping blood pressure under good control for stroke prevention and overall healthy Encouragement given for smoking cessation  Symptom Management: Take medications as prescribed   Attend all scheduled provider appointments Call pharmacy for medication refills 3-7 days in advance of running out of medications Attend church or other social activities Call provider office for new concerns or questions  call the Suicide and Crisis Lifeline: 988 call the [place] National Suicide Prevention Lifeline: (518)785-9946 or TTY: (678) 586-1569 TTY 570-825-8002) to talk to a trained counselor call 1-800-273-TALK (toll free, 24 hour hotline) go to [facility] [address], [place] [phone]) call the [facility] Crisis Line: [phone] call 911 if experiencing a Mental Health or Behavioral Health Crisis   Follow Up Plan: Telephone follow up appointment with care management team member scheduled for:  04/11/23 at 3 pm           Our next appointment is by telephone on 04/11/23 at 3 pm  Please call the care guide team at [phone] if you need to cancel or reschedule your appointment.   If you are experiencing a Mental Health or Behavioral Health Crisis or need someone to talk to, please call the Suicide and Crisis Lifeline: 988 call the [place] National Suicide Prevention Lifeline: 704-770-0028 or TTY: 682-460-9492 TTY (815)078-9548) to talk to a trained counselor call 1-800-273-TALK (toll free, 24 hour hotline) go to [facility] [address], [place] [phone]) call the [facility] Line: [phone] call 911   The patient  verbalized understanding of instructions, educational materials, and care plan provided today and DECLINED offer to receive copy of patient instructions, educational materials, and care plan.   Telephone follow up appointment with care management team member scheduled for: 04/11/23 at 3 pm  Irving Shows [facility], BSN [place]/ Ambulatory Care Management [phone]

## 2023-01-26 DIAGNOSIS — J441 Chronic obstructive pulmonary disease with (acute) exacerbation: Secondary | ICD-10-CM | POA: Diagnosis not present

## 2023-01-28 NOTE — Progress Notes (Signed)
DEXA shows osteopenia. I recommend weekly fosamax. ?Nurse, if pt. Is agreeable, send in Fosamax 70 mg weekly, #13. ? ?Thanks, ?WS ?

## 2023-01-29 ENCOUNTER — Other Ambulatory Visit: Payer: Self-pay | Admitting: *Deleted

## 2023-01-29 ENCOUNTER — Ambulatory Visit: Payer: Self-pay | Admitting: *Deleted

## 2023-01-29 ENCOUNTER — Encounter: Payer: Self-pay | Admitting: *Deleted

## 2023-01-29 DIAGNOSIS — Z7951 Long term (current) use of inhaled steroids: Secondary | ICD-10-CM | POA: Diagnosis not present

## 2023-01-29 DIAGNOSIS — Z7902 Long term (current) use of antithrombotics/antiplatelets: Secondary | ICD-10-CM | POA: Diagnosis not present

## 2023-01-29 DIAGNOSIS — M8580 Other specified disorders of bone density and structure, unspecified site: Secondary | ICD-10-CM

## 2023-01-29 DIAGNOSIS — I509 Heart failure, unspecified: Secondary | ICD-10-CM | POA: Diagnosis not present

## 2023-01-29 DIAGNOSIS — J4489 Other specified chronic obstructive pulmonary disease: Secondary | ICD-10-CM | POA: Diagnosis not present

## 2023-01-29 DIAGNOSIS — Z9181 History of falling: Secondary | ICD-10-CM | POA: Diagnosis not present

## 2023-01-29 DIAGNOSIS — Z87891 Personal history of nicotine dependence: Secondary | ICD-10-CM | POA: Diagnosis not present

## 2023-01-29 DIAGNOSIS — M1990 Unspecified osteoarthritis, unspecified site: Secondary | ICD-10-CM | POA: Diagnosis not present

## 2023-01-29 DIAGNOSIS — Q824 Ectodermal dysplasia (anhidrotic): Secondary | ICD-10-CM | POA: Diagnosis not present

## 2023-01-29 DIAGNOSIS — Z8673 Personal history of transient ischemic attack (TIA), and cerebral infarction without residual deficits: Secondary | ICD-10-CM | POA: Diagnosis not present

## 2023-01-29 DIAGNOSIS — M109 Gout, unspecified: Secondary | ICD-10-CM | POA: Diagnosis not present

## 2023-01-29 DIAGNOSIS — K219 Gastro-esophageal reflux disease without esophagitis: Secondary | ICD-10-CM | POA: Diagnosis not present

## 2023-01-29 DIAGNOSIS — I110 Hypertensive heart disease with heart failure: Secondary | ICD-10-CM | POA: Diagnosis not present

## 2023-01-29 DIAGNOSIS — K759 Inflammatory liver disease, unspecified: Secondary | ICD-10-CM | POA: Diagnosis not present

## 2023-01-29 DIAGNOSIS — Z7982 Long term (current) use of aspirin: Secondary | ICD-10-CM | POA: Diagnosis not present

## 2023-01-29 DIAGNOSIS — R634 Abnormal weight loss: Secondary | ICD-10-CM | POA: Diagnosis not present

## 2023-01-29 MED ORDER — ALENDRONATE SODIUM 70 MG PO TABS
70.0000 mg | ORAL_TABLET | ORAL | 3 refills | Status: DC
Start: 2023-01-29 — End: 2024-02-25
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
## 2023-02-04 DIAGNOSIS — J4489 Other specified chronic obstructive pulmonary disease: Secondary | ICD-10-CM | POA: Diagnosis not present

## 2023-02-04 DIAGNOSIS — Z7951 Long term (current) use of inhaled steroids: Secondary | ICD-10-CM | POA: Diagnosis not present

## 2023-02-04 DIAGNOSIS — I509 Heart failure, unspecified: Secondary | ICD-10-CM | POA: Diagnosis not present

## 2023-02-04 DIAGNOSIS — K219 Gastro-esophageal reflux disease without esophagitis: Secondary | ICD-10-CM | POA: Diagnosis not present

## 2023-02-04 DIAGNOSIS — M109 Gout, unspecified: Secondary | ICD-10-CM | POA: Diagnosis not present

## 2023-02-04 DIAGNOSIS — Z7902 Long term (current) use of antithrombotics/antiplatelets: Secondary | ICD-10-CM | POA: Diagnosis not present

## 2023-02-04 DIAGNOSIS — Z7982 Long term (current) use of aspirin: Secondary | ICD-10-CM | POA: Diagnosis not present

## 2023-02-04 DIAGNOSIS — J449 Chronic obstructive pulmonary disease, unspecified: Secondary | ICD-10-CM | POA: Diagnosis not present

## 2023-02-04 DIAGNOSIS — Q824 Ectodermal dysplasia (anhidrotic): Secondary | ICD-10-CM | POA: Diagnosis not present

## 2023-02-04 DIAGNOSIS — K759 Inflammatory liver disease, unspecified: Secondary | ICD-10-CM | POA: Diagnosis not present

## 2023-02-04 DIAGNOSIS — Z8673 Personal history of transient ischemic attack (TIA), and cerebral infarction without residual deficits: Secondary | ICD-10-CM | POA: Diagnosis not present

## 2023-02-04 DIAGNOSIS — R634 Abnormal weight loss: Secondary | ICD-10-CM | POA: Diagnosis not present

## 2023-02-04 DIAGNOSIS — Z87891 Personal history of nicotine dependence: Secondary | ICD-10-CM | POA: Diagnosis not present

## 2023-02-04 DIAGNOSIS — M1990 Unspecified osteoarthritis, unspecified site: Secondary | ICD-10-CM | POA: Diagnosis not present

## 2023-02-04 DIAGNOSIS — Z9181 History of falling: Secondary | ICD-10-CM | POA: Diagnosis not present

## 2023-02-04 DIAGNOSIS — I110 Hypertensive heart disease with heart failure: Secondary | ICD-10-CM | POA: Diagnosis not present

## 2023-02-11 DIAGNOSIS — K759 Inflammatory liver disease, unspecified: Secondary | ICD-10-CM | POA: Diagnosis not present

## 2023-02-11 DIAGNOSIS — J4489 Other specified chronic obstructive pulmonary disease: Secondary | ICD-10-CM | POA: Diagnosis not present

## 2023-02-11 DIAGNOSIS — I509 Heart failure, unspecified: Secondary | ICD-10-CM | POA: Diagnosis not present

## 2023-02-11 DIAGNOSIS — Q824 Ectodermal dysplasia (anhidrotic): Secondary | ICD-10-CM | POA: Diagnosis not present

## 2023-02-11 DIAGNOSIS — Z9181 History of falling: Secondary | ICD-10-CM | POA: Diagnosis not present

## 2023-02-11 DIAGNOSIS — M1990 Unspecified osteoarthritis, unspecified site: Secondary | ICD-10-CM | POA: Diagnosis not present

## 2023-02-11 DIAGNOSIS — R634 Abnormal weight loss: Secondary | ICD-10-CM | POA: Diagnosis not present

## 2023-02-11 DIAGNOSIS — I110 Hypertensive heart disease with heart failure: Secondary | ICD-10-CM | POA: Diagnosis not present

## 2023-02-11 DIAGNOSIS — M109 Gout, unspecified: Secondary | ICD-10-CM | POA: Diagnosis not present

## 2023-02-11 DIAGNOSIS — Z7902 Long term (current) use of antithrombotics/antiplatelets: Secondary | ICD-10-CM | POA: Diagnosis not present

## 2023-02-11 DIAGNOSIS — Z7982 Long term (current) use of aspirin: Secondary | ICD-10-CM | POA: Diagnosis not present

## 2023-02-11 DIAGNOSIS — Z8673 Personal history of transient ischemic attack (TIA), and cerebral infarction without residual deficits: Secondary | ICD-10-CM | POA: Diagnosis not present

## 2023-02-11 DIAGNOSIS — Z87891 Personal history of nicotine dependence: Secondary | ICD-10-CM | POA: Diagnosis not present

## 2023-02-11 DIAGNOSIS — K219 Gastro-esophageal reflux disease without esophagitis: Secondary | ICD-10-CM | POA: Diagnosis not present

## 2023-02-11 DIAGNOSIS — Z7951 Long term (current) use of inhaled steroids: Secondary | ICD-10-CM | POA: Diagnosis not present

## 2023-02-13 ENCOUNTER — Ambulatory Visit: Payer: Self-pay | Admitting: *Deleted
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
## 2023-02-25 DIAGNOSIS — J441 Chronic obstructive pulmonary disease with (acute) exacerbation: Secondary | ICD-10-CM | POA: Diagnosis not present

## 2023-02-27 ENCOUNTER — Ambulatory Visit: Payer: Self-pay | Admitting: *Deleted
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
## 2023-03-04 ENCOUNTER — Ambulatory Visit (INDEPENDENT_AMBULATORY_CARE_PROVIDER_SITE_OTHER): Payer: 59 | Admitting: Family Medicine

## 2023-03-04 ENCOUNTER — Encounter: Payer: Self-pay | Admitting: Family Medicine

## 2023-03-04 VITALS — BP 132/66 | HR 73 | Temp 97.9°F | Ht 61.25 in | Wt 134.6 lb

## 2023-03-04 DIAGNOSIS — M549 Dorsalgia, unspecified: Secondary | ICD-10-CM | POA: Diagnosis not present

## 2023-03-04 DIAGNOSIS — E782 Mixed hyperlipidemia: Secondary | ICD-10-CM

## 2023-03-04 DIAGNOSIS — M5430 Sciatica, unspecified side: Secondary | ICD-10-CM | POA: Diagnosis not present

## 2023-03-04 DIAGNOSIS — I10 Essential (primary) hypertension: Secondary | ICD-10-CM | POA: Diagnosis not present

## 2023-03-04 MED ORDER — MELOXICAM 7.5 MG PO TABS: 7.5000 mg | ORAL_TABLET | Freq: Every day | ORAL | 2 refills | Status: DC

## 2023-03-04 MED ORDER — SUMATRIPTAN SUCCINATE 100 MG PO TABS: ORAL_TABLET | ORAL | 11 refills | Status: DC

## 2023-03-04 MED ORDER — TIZANIDINE HCL 4 MG PO TABS: 4.0000 mg | ORAL_TABLET | Freq: Four times a day (QID) | ORAL | 2 refills | Status: DC | PRN

## 2023-03-04 NOTE — Progress Notes (Signed)
Subjective:  Patient ID: [name], female    DOB: 23-Jul-1940  Age: 82 y.o. MRN: [id number]  CC: Follow-up   HPI [name] presents for Back pain from L4 area to knees. Hurts from knee down for  Her pants to touch her.   She denies sx of edema, fatigue or chest pain. Dyspnea is at baseline. Meds help.    presents for  follow-up of hypertension. Patient has no history of headache chest pain or shortness of breath or recent cough. Patient also denies symptoms of TIA such as focal numbness or weakness. Patient denies side effects from medication. States taking it regularly.   in for follow-up of elevated cholesterol. Doing well without complaints on current medication. Denies side effects of statin including myalgia and arthralgia and nausea. Currently no chest pain, shortness of breath or other cardiovascular related symptoms noted.      03/04/2023   11:42 AM 03/04/2023   11:35 AM 01/23/2023   10:59 AM  Depression screen PHQ 2/9  Decreased Interest 2 0 0  Down, Depressed, Hopeless 2 0 0  PHQ - 2 Score 4 0 0  Altered sleeping 2  2  Tired, decreased energy 2  2  Change in appetite 2  0  Feeling bad or failure about yourself  3  0  Trouble concentrating 3  2  Moving slowly or fidgety/restless 3  0  Suicidal thoughts   0  PHQ-9 Score 19  6  Difficult doing work/chores Not difficult at all  Very difficult    History [name] has a past medical history of Arthritis, Atypical chest pain (03/19/2022), GERD (gastroesophageal reflux disease), Headache(784.0), Hepatitis, History of gout, Hypertension, Hypertensive encephalopathy, 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   She has a past surgical history that includes Abdominal hysterectomy; Anterior cervical corpectomy (03/07/2012); Tonsillectomy; Multiple tooth extractions; Cataract extraction w/PHACO (Left, 04/12/2017); and Cataract extraction w/PHACO  (Right, 05/03/2017).   Her family history includes Asthma in her daughter; Bipolar disorder in her daughter, daughter, daughter, daughter, and son; Drug abuse in her daughter; Heart disease in her daughter and daughter; Hypertension in her daughter. She was adopted.She reports that she quit smoking about a year ago. Her smoking use included cigarettes. She started smoking about 62 years ago. She has a 30.9 pack-year smoking history. She has been exposed to tobacco smoke. She has never used smokeless tobacco. She reports current alcohol use. She reports that she does not use drugs.    ROS Review of Systems  Constitutional: Negative.   HENT: Negative.    Eyes:  Negative for visual disturbance.  Respiratory:  Negative for shortness of breath.   Cardiovascular:  Negative for chest pain.  Gastrointestinal:  Negative for abdominal pain.  Musculoskeletal:  Negative for arthralgias.    Objective:  BP 132/66   Pulse 73   Temp 97.9 F (36.6 C)   Ht 5' 1.25" (1.556 m)   Wt 134 lb 9.6 oz (61.1 kg)   SpO2 95%   BMI 25.23 kg/m   BP Readings from Last 3 Encounters:  03/04/23 132/66  01/23/23 (!) 181/68  01/02/23 138/84    Wt Readings from Last 3 Encounters:  03/04/23 134 lb 9.6 oz (61.1 kg)  01/23/23 135 lb 3.2 oz (61.3 kg)  01/02/23 139 lb 12.8 oz (63.4 kg)     Physical Exam Constitutional:      General: She is not in acute distress.    Appearance: She  is well-developed.  Cardiovascular:     Rate and Rhythm: Normal rate and regular rhythm.  Pulmonary:     Breath sounds: Normal breath sounds.  Musculoskeletal:        General: Normal range of motion.  Skin:    General: Skin is warm and dry.  Neurological:     Mental Status: She is alert and oriented to person, place, and time.       Assessment & Plan:   [name] was seen today for follow-up.  Diagnoses and all orders for this visit:  Essential hypertension with goal blood pressure less than 130/80  Mixed  hyperlipidemia  Back pain with sciatica  Other orders -     SUMAtriptan (IMITREX) 100 MG tablet; Take one at onset of HA. May repeat in 2 hours if headache persists or recurs. Limit two per 24 hours -     tiZANidine (ZANAFLEX) 4 MG tablet; Take 1 tablet (4 mg total) by mouth every 6 (six) hours as needed for muscle spasms. -     meloxicam (MOBIC) 7.5 MG tablet; Take 1 tablet (7.5 mg total) by mouth daily. For joint and muscle pain       I have discontinued [name] cyclobenzaprine. I am also having her start on SUMAtriptan, tiZANidine, and meloxicam. Additionally, I am having her maintain her atorvastatin, amLODipine, isosorbide dinitrate, cyanocobalamin, ezetimibe, budesonide-formoterol, albuterol, acetaminophen, Vitamin D (Ergocalciferol), potassium chloride SA, furosemide, metoprolol succinate, olmesartan, clopidogrel, budesonide, arformoterol, ipratropium-albuterol, cloNIDine, Repatha SureClick, and alendronate.  Allergies as of 03/04/2023       Reactions   Penicillins Anaphylaxis, Swelling, Rash, Other (See Comments)   Has patient had a PCN reaction causing immediate rash, facial/tongue/throat swelling, SOB or lightheadedness with hypotension: yes Has patient had a PCN reaction causing severe rash involving mucus membranes or skin necrosis: no Has patient had a PCN reaction that required hospitalization: yes Has patient had a PCN reaction occurring within the last 10 years: no If all of the above answers are "NO", then may proceed with Cephalosporin use.        Medication List        Accurate as of March 04, 2023  5:41 PM. If you have any questions, ask your nurse or doctor.          STOP taking these medications    cyclobenzaprine 5 MG tablet Commonly known as: FLEXERIL Stopped by: Glade Strausser       TAKE these medications    acetaminophen 500 MG tablet Commonly known as: TYLENOL Take 2 tablets (1,000 mg total) by mouth 3 (three) times daily.    albuterol 108 (90 Base) MCG/ACT inhaler Commonly known as: VENTOLIN HFA Inhale 2 puffs into the lungs every 6 (six) hours as needed for wheezing or shortness of breath.   alendronate 70 MG tablet Commonly known as: FOSAMAX Take 1 tablet (70 mg total) by mouth every 7 (seven) days. Take with a full glass of water on an empty stomach.   amLODipine 10 MG tablet Commonly known as: NORVASC Take 1 tablet (10 mg total) by mouth daily.   arformoterol 15 MCG/2ML Nebu Commonly known as: BROVANA Take 2 mLs (15 mcg total) by nebulization 2 (two) times daily.   atorvastatin 80 MG tablet Commonly known as: LIPITOR Take 1 tablet (80 mg total) by mouth daily.   budesonide 0.5 MG/2ML nebulizer solution Commonly known as: PULMICORT Take 2 mLs (0.5 mg total) by nebulization 2 (two) times daily.   budesonide-formoterol 160-4.5 MCG/ACT inhaler  Commonly known as: SYMBICORT Inhale 2 puffs into the lungs 2 (two) times daily.   cloNIDine 0.1 MG tablet Commonly known as: CATAPRES Take 1 tablet (0.1 mg total) by mouth 2 (two) times daily. For Blood Pressure   clopidogrel 75 MG tablet Commonly known as: PLAVIX Take 1 tablet (75 mg total) by mouth daily.   cyanocobalamin 1000 MCG tablet Take 1 tablet (1,000 mcg total) by mouth daily.   ezetimibe 10 MG tablet Commonly known as: ZETIA Take 1 tablet (10 mg total) by mouth daily.   furosemide 40 MG tablet Commonly known as: LASIX Take 1 tablet (40 mg total) by mouth daily. For swelling   ipratropium-albuterol 0.5-2.5 (3) MG/3ML Soln Commonly known as: DUONEB Take 3 mLs by nebulization in the morning and at bedtime.   isosorbide dinitrate 20 MG tablet Commonly known as: ISORDIL Take 1 tablet (20 mg total) by mouth 3 (three) times daily.   meloxicam 7.5 MG tablet Commonly known as: MOBIC Take 1 tablet (7.5 mg total) by mouth daily. For joint and muscle pain Started by: Broadus John Esai Stecklein   metoprolol succinate 50 MG 24 hr tablet Commonly known  as: TOPROL-XL Take 1 tablet (50 mg total) by mouth daily. For blood pressure control   olmesartan 40 MG tablet Commonly known as: Benicar Take 1 tablet (40 mg total) by mouth daily. For blood pressure   potassium chloride SA 20 MEQ tablet Commonly known as: KLOR-CON M Take 1 tablet (20 mEq total) by mouth daily. For potassium replacement/ supplement   Repatha SureClick 140 MG/ML Soaj Generic drug: Evolocumab Inject 140 mg into the skin every 14 (fourteen) days.   SUMAtriptan 100 MG tablet Commonly known as: Imitrex Take one at onset of HA. May repeat in 2 hours if headache persists or recurs. Limit two per 24 hours Started by: Jurnee Nakayama   tiZANidine 4 MG tablet Commonly known as: ZANAFLEX Take 1 tablet (4 mg total) by mouth every 6 (six) hours as needed for muscle spasms. Started by: Avah Bashor   Vitamin D (Ergocalciferol) 1.25 MG (50000 UNIT) Caps capsule Commonly known as: DRISDOL Take 1 capsule (50,000 Units total) by mouth every 7 (seven) days.         Follow-up: Return in about 1 month (around 04/04/2023).  Mechele Claude, M.D.

## 2023-03-05 ENCOUNTER — Emergency Department (HOSPITAL_COMMUNITY): Payer: 59

## 2023-03-05 ENCOUNTER — Emergency Department (HOSPITAL_COMMUNITY)
Admission: EM | Admit: 2023-03-05 | Discharge: 2023-03-05 | Disposition: A | Discharge status: Home / Self Care | Payer: 59 | Attending: Emergency Medicine | Admitting: Emergency Medicine

## 2023-03-05 ENCOUNTER — Other Ambulatory Visit: Payer: Self-pay

## 2023-03-05 ENCOUNTER — Encounter (HOSPITAL_COMMUNITY): Payer: Self-pay | Admitting: Emergency Medicine

## 2023-03-05 DIAGNOSIS — R404 Transient alteration of awareness: Secondary | ICD-10-CM | POA: Diagnosis not present

## 2023-03-05 DIAGNOSIS — J449 Chronic obstructive pulmonary disease, unspecified: Secondary | ICD-10-CM | POA: Insufficient documentation

## 2023-03-05 DIAGNOSIS — I700 Atherosclerosis of aorta: Secondary | ICD-10-CM | POA: Diagnosis not present

## 2023-03-05 DIAGNOSIS — Z7902 Long term (current) use of antithrombotics/antiplatelets: Secondary | ICD-10-CM | POA: Diagnosis not present

## 2023-03-05 DIAGNOSIS — Z79899 Other long term (current) drug therapy: Secondary | ICD-10-CM | POA: Diagnosis not present

## 2023-03-05 DIAGNOSIS — R9082 White matter disease, unspecified: Secondary | ICD-10-CM | POA: Diagnosis not present

## 2023-03-05 DIAGNOSIS — R55 Syncope and collapse: Secondary | ICD-10-CM | POA: Insufficient documentation

## 2023-03-05 DIAGNOSIS — N189 Chronic kidney disease, unspecified: Secondary | ICD-10-CM | POA: Diagnosis not present

## 2023-03-05 DIAGNOSIS — R4182 Altered mental status, unspecified: Secondary | ICD-10-CM | POA: Diagnosis not present

## 2023-03-05 DIAGNOSIS — I129 Hypertensive chronic kidney disease with stage 1 through stage 4 chronic kidney disease, or unspecified chronic kidney disease: Secondary | ICD-10-CM | POA: Diagnosis not present

## 2023-03-05 DIAGNOSIS — S0990XA Unspecified injury of head, initial encounter: Secondary | ICD-10-CM | POA: Diagnosis not present

## 2023-03-05 DIAGNOSIS — Z743 Need for continuous supervision: Secondary | ICD-10-CM | POA: Diagnosis not present

## 2023-03-05 DIAGNOSIS — R42 Dizziness and giddiness: Secondary | ICD-10-CM | POA: Diagnosis not present

## 2023-03-05 DIAGNOSIS — R6889 Other general symptoms and signs: Secondary | ICD-10-CM | POA: Diagnosis not present

## 2023-03-05 HISTORY — DX: Heart failure, unspecified: I50.9

## 2023-03-05 LAB — CBC
HCT: 47.3 % — ABNORMAL HIGH (ref 36.0–46.0)
Hemoglobin: 14.8 g/dL (ref 12.0–15.0)
MCH: 30.3 pg (ref 26.0–34.0)
MCHC: 31.3 g/dL (ref 30.0–36.0)
MCV: 96.7 fL (ref 80.0–100.0)
Platelets: 150 10*3/uL (ref 150–400)
RBC: 4.89 MIL/uL (ref 3.87–5.11)
RDW: 13.1 % (ref 11.5–15.5)
WBC: 5.1 10*3/uL (ref 4.0–10.5)
nRBC: 0 % (ref 0.0–0.2)

## 2023-03-05 LAB — URINALYSIS, ROUTINE W REFLEX MICROSCOPIC
Bilirubin Urine: NEGATIVE
Glucose, UA: NEGATIVE mg/dL
Ketones, ur: NEGATIVE mg/dL
Leukocytes,Ua: NEGATIVE
Nitrite: NEGATIVE
Protein, ur: NEGATIVE mg/dL
Specific Gravity, Urine: 1.004 — ABNORMAL LOW (ref 1.005–1.030)
pH: 6 (ref 5.0–8.0)

## 2023-03-05 LAB — HEPATIC FUNCTION PANEL
ALT: 14 U/L (ref 0–44)
AST: 18 U/L (ref 15–41)
Albumin: 3.8 g/dL (ref 3.5–5.0)
Alkaline Phosphatase: 49 U/L (ref 38–126)
Bilirubin, Direct: 0.1 mg/dL (ref 0.0–0.2)
Indirect Bilirubin: 0.8 mg/dL (ref 0.3–0.9)
Total Bilirubin: 0.9 mg/dL (ref 0.3–1.2)
Total Protein: 7 g/dL (ref 6.5–8.1)

## 2023-03-05 LAB — BASIC METABOLIC PANEL
Anion gap: 10 (ref 5–15)
BUN: 23 mg/dL (ref 8–23)
CO2: 27 mmol/L (ref 22–32)
Calcium: 8.7 mg/dL — ABNORMAL LOW (ref 8.9–10.3)
Chloride: 101 mmol/L (ref 98–111)
Creatinine, Ser: 1.87 mg/dL — ABNORMAL HIGH (ref 0.44–1.00)
GFR, Estimated: 27 mL/min — ABNORMAL LOW (ref >60)
Glucose, Bld: 115 mg/dL — ABNORMAL HIGH (ref 70–99)
Potassium: 3.7 mmol/L (ref 3.5–5.1)
Sodium: 138 mmol/L (ref 135–145)

## 2023-03-05 LAB — MAGNESIUM: Magnesium: 2.2 mg/dL (ref 1.7–2.4)

## 2023-03-05 LAB — BRAIN NATRIURETIC PEPTIDE: B Natriuretic Peptide: 51 pg/mL (ref 0.0–100.0)

## 2023-03-05 LAB — TROPONIN I (HIGH SENSITIVITY)
Troponin I (High Sensitivity): 7 ng/L (ref <18)
Troponin I (High Sensitivity): 8 ng/L (ref <18)

## 2023-03-05 LAB — CBG MONITORING, ED: Glucose-Capillary: 101 mg/dL — ABNORMAL HIGH (ref 70–99)

## 2023-03-05 MED ORDER — MECLIZINE HCL 12.5 MG PO TABS
12.5000 mg | ORAL_TABLET | Freq: Once | ORAL | Status: AC
  Administered 2023-03-05: 12.5 mg via ORAL
  Filled 2023-03-05: qty 1

## 2023-03-05 MED ORDER — MECLIZINE HCL 12.5 MG PO TABS: 12.5000 mg | ORAL_TABLET | Freq: Three times a day (TID) | ORAL | 0 refills | Status: DC | PRN

## 2023-03-05 MED ORDER — LACTATED RINGERS IV BOLUS
1000.0000 mL | Freq: Once | INTRAVENOUS | Status: AC
  Administered 2023-03-05: 1000 mL via INTRAVENOUS
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
## 2023-03-05 NOTE — ED Provider Notes (Signed)
[facility] AT [facility] Provider Note   CSN: [id number] Arrival date & time: 03/05/23  1159     History  Chief Complaint  Patient presents with   [name]    ANESIA BLACKWELL is a 82 y.o. female.   [name] Patient presents for [name].  Medical history includes HTN, GERD, CVA with residual left-sided weakness, COPD, CKD, anxiety, osteopenia, PAD.  She was seen at PCPs office yesterday.  At the time, she endorsed lumbar pain and distal lower extremity pain.  She was started on sumatriptan, tizanidine, and Mobic.  Her Flexeril was discontinued.  There is no documented complaint of [name] from her visit yesterday.  Today, she endorses [name] over the past 2 to 3 days.  At around the time of onset, she did strike her head on the underside of the freezer door on her fridge.  She states that this caused her to lose consciousness and fall to the ground.  She denies any headache pain at this time.  She has no [name] currently while at rest.  [name] occurs with standing and ambulating.  She has had decreased p.o. intake due to poor appetite lately.  She denies any nausea or vomiting.  She has had some small-volume loose stools.  She denies any hematochezia or melena.     Home Medications Prior to Admission medications   Medication Sig Start Date End Date Taking? Authorizing Provider  meclizine (ANTIVERT) 12.5 MG tablet Take 1 tablet (12.5 mg total) by mouth 3 (three) times daily as needed for [name]. 03/05/23  Yes Gloris Manchester, MD  acetaminophen (TYLENOL) 500 MG tablet Take 2 tablets (1,000 mg total) by mouth 3 (three) times daily. 10/30/22   Mechele Claude, MD  albuterol (VENTOLIN HFA) 108 (90 Base) MCG/ACT inhaler Inhale 2 puffs into the lungs every 6 (six) hours as needed for wheezing or shortness of breath. 10/30/22   Mechele Claude, MD  alendronate (FOSAMAX) 70 MG tablet Take 1 tablet (70 mg total) by mouth every 7 (seven) days. Take with a full  glass of water on an empty stomach. 01/29/23   Mechele Claude, MD  amLODipine (NORVASC) 10 MG tablet Take 1 tablet (10 mg total) by mouth daily. 09/20/22   Johnson, Clanford L, MD  arformoterol (BROVANA) 15 MCG/2ML NEBU Take 2 mLs (15 mcg total) by nebulization 2 (two) times daily. 01/02/23   Hunsucker, Lesia Sago, MD  atorvastatin (LIPITOR) 80 MG tablet Take 1 tablet (80 mg total) by mouth daily. 04/18/22   Alver Sorrow, NP  budesonide (PULMICORT) 0.5 MG/2ML nebulizer solution Take 2 mLs (0.5 mg total) by nebulization 2 (two) times daily. 01/02/23   Hunsucker, Lesia Sago, MD  budesonide-formoterol (SYMBICORT) 160-4.5 MCG/ACT inhaler Inhale 2 puffs into the lungs 2 (two) times daily. 10/30/22   Mechele Claude, MD  cloNIDine (CATAPRES) 0.1 MG tablet Take 1 tablet (0.1 mg total) by mouth 2 (two) times daily. For Blood Pressure 01/23/23   Mechele Claude, MD  clopidogrel (PLAVIX) 75 MG tablet Take 1 tablet (75 mg total) by mouth daily. 12/19/22   Mechele Claude, MD  Evolocumab (REPATHA SURECLICK) 140 MG/ML SOAJ Inject 140 mg into the skin every 14 (fourteen) days. 01/25/23   Chilton Si, MD  ezetimibe (ZETIA) 10 MG tablet Take 1 tablet (10 mg total) by mouth daily. 10/16/22 01/14/23  Alver Sorrow, NP  furosemide (LASIX) 40 MG tablet Take 1 tablet (40 mg total) by mouth daily. For swelling 12/11/22   Mechele Claude, MD  ipratropium-albuterol (DUONEB) 0.5-2.5 (3) MG/3ML SOLN Take 3 mLs by nebulization in the morning and at bedtime. 01/02/23   Hunsucker, Lesia Sago, MD  isosorbide dinitrate (ISORDIL) 20 MG tablet Take 1 tablet (20 mg total) by mouth 3 (three) times daily. 09/20/22   Johnson, Clanford L, MD  meloxicam (MOBIC) 7.5 MG tablet Take 1 tablet (7.5 mg total) by mouth daily. For joint and muscle pain 03/04/23   Mechele Claude, MD  metoprolol succinate (TOPROL-XL) 50 MG 24 hr tablet Take 1 tablet (50 mg total) by mouth daily. For blood pressure control 12/11/22   Mechele Claude, MD  olmesartan (BENICAR) 40  MG tablet Take 1 tablet (40 mg total) by mouth daily. For blood pressure 12/11/22   Mechele Claude, MD  potassium chloride SA (KLOR-CON M) 20 MEQ tablet Take 1 tablet (20 mEq total) by mouth daily. For potassium replacement/ supplement 12/11/22   Mechele Claude, MD  SUMAtriptan (IMITREX) 100 MG tablet Take one at onset of HA. May repeat in 2 hours if headache persists or recurs. Limit two per 24 hours 03/04/23   Mechele Claude, MD  tiZANidine (ZANAFLEX) 4 MG tablet Take 1 tablet (4 mg total) by mouth every 6 (six) hours as needed for muscle spasms. 03/04/23   Mechele Claude, MD  vitamin B-12 1000 MCG tablet Take 1 tablet (1,000 mcg total) by mouth daily. 09/21/22   Johnson, Clanford L, MD  Vitamin D, Ergocalciferol, (DRISDOL) 1.25 MG (50000 UNIT) CAPS capsule Take 1 capsule (50,000 Units total) by mouth every 7 (seven) days. 10/31/22   Mechele Claude, MD      Allergies    Penicillins    Review of Systems   Review of Systems  Neurological:  Positive for [name].  All other systems reviewed and are negative.   Physical Exam Updated Vital Signs BP (!) 154/66   Pulse 67   Temp 97.8 F (36.6 C) (Oral)   Resp 20   Ht 5\' 1"  (1.549 m)   Wt 61.2 kg   SpO2 93%   BMI 25.51 kg/m  Physical Exam Vitals and nursing note reviewed.  Constitutional:      General: She is not in acute distress.    Appearance: Normal appearance. She is well-developed. She is not ill-appearing, toxic-appearing or diaphoretic.  HENT:     Head: Normocephalic and atraumatic.     Right Ear: External ear normal.     Left Ear: External ear normal.     Nose: Nose normal.     Mouth/Throat:     Mouth: Mucous membranes are moist.  Eyes:     Extraocular Movements: Extraocular movements intact.     Conjunctiva/sclera: Conjunctivae normal.  Cardiovascular:     Rate and Rhythm: Normal rate and regular rhythm.     Heart sounds: No murmur heard. Pulmonary:     Effort: Pulmonary effort is normal. No respiratory distress.      Breath sounds: Normal breath sounds. No wheezing or rales.  Chest:     Chest wall: No tenderness.  Abdominal:     General: There is no distension.     Palpations: Abdomen is soft.     Tenderness: There is no abdominal tenderness.  Musculoskeletal:        General: No swelling. Normal range of motion.     Cervical back: Normal range of motion and neck supple.     Right lower leg: No edema.     Left lower leg: No edema.  Skin:    General: Skin  is warm and dry.     Coloration: Skin is not jaundiced or pale.  Neurological:     General: No focal deficit present.     Mental Status: She is alert and oriented to person, place, and time.     Cranial Nerves: No cranial nerve deficit.     Sensory: No sensory deficit.     Motor: No weakness.     Coordination: Coordination normal.  Psychiatric:        Mood and Affect: Mood normal.        Behavior: Behavior normal.     ED Results / Procedures / Treatments   Labs (all labs ordered are listed, but only abnormal results are displayed) Labs Reviewed  URINALYSIS, ROUTINE W REFLEX MICROSCOPIC - Abnormal; Notable for the following components:      Result Value   Color, Urine STRAW (*)    Specific Gravity, Urine 1.004 (*)    Hgb urine dipstick SMALL (*)    Bacteria, UA RARE (*)    All other components within normal limits  BASIC METABOLIC PANEL - Abnormal; Notable for the following components:   Glucose, Bld 115 (*)    Creatinine, Ser 1.87 (*)    Calcium 8.7 (*)    GFR, Estimated 27 (*)    All other components within normal limits  CBC - Abnormal; Notable for the following components:   HCT 47.3 (*)    All other components within normal limits  CBG MONITORING, ED - Abnormal; Notable for the following components:   Glucose-Capillary 101 (*)    All other components within normal limits  MAGNESIUM  HEPATIC FUNCTION PANEL  BRAIN NATRIURETIC PEPTIDE  TROPONIN I (HIGH SENSITIVITY)  TROPONIN I (HIGH SENSITIVITY)    EKG EKG  Interpretation Date/Time:  Tuesday March 05 2023 12:31:18 EDT Ventricular Rate:  56 PR Interval:  148 QRS Duration:  92 QT Interval:  474 QTC Calculation: 457 R Axis:   -12  Text Interpretation: Sinus bradycardia with Premature atrial complexes Incomplete right bundle branch block Moderate voltage criteria for LVH, may be normal variant ( R in aVL , Cornell product ) Septal infarct , age undetermined Abnormal ECG Confirmed by Gloris Manchester [phone]) on 03/05/2023 4:43:25 PM  Radiology CT Head Wo Contrast  Result Date: 03/05/2023 CLINICAL DATA:  Head trauma, minor (Age >= 65y) EXAM: CT HEAD WITHOUT CONTRAST TECHNIQUE: Contiguous axial images were obtained from the base of the skull through the vertex without intravenous contrast. RADIATION DOSE REDUCTION: This exam was performed according to the departmental dose-optimization program which includes automated exposure control, adjustment of the mA and/or kV according to patient size and/or use of iterative reconstruction technique. COMPARISON:  12/07/2022 FINDINGS: Brain: Normal anatomic configuration. Parenchymal volume loss is commensurate with the patient's age. Stable mild periventricular white matter changes are present likely reflecting the sequela of small vessel ischemia. Remote lacunar infarct noted within the left basal ganglia, unchanged. No abnormal intra or extra-axial mass lesion or fluid collection. No abnormal mass effect or midline shift. No evidence of acute intracranial hemorrhage or infarct. Ventricular size is normal. Cerebellum unremarkable. Vascular: No asymmetric hyperdense vasculature at the skull base. Skull: Intact Sinuses/Orbits: Paranasal sinuses are clear. Orbits are unremarkable. Other: Mastoid air cells and middle ear cavities are clear. IMPRESSION: 1. No acute intracranial hemorrhage or infarct. No calvarial fracture. 2. Stable senescent change. 3. Remote lacunar infarct of the left basal ganglia. Electronically Signed   By:  Helyn Numbers M.D.   On: 03/05/2023 18:43  DG Chest Portable 1 View  Result Date: 03/05/2023 CLINICAL DATA:  Near-syncope EXAM: PORTABLE CHEST 1 VIEW COMPARISON:  X-ray 12/11/2022 FINDINGS: Hyperinflation. No consolidation, pneumothorax or effusion. No edema. Normal cardiopericardial silhouette. Calcified aorta. Fixation hardware along the lower cervical spine. Degenerative changes seen along the spine. IMPRESSION: Hyperinflation.  No acute cardiopulmonary disease. Electronically Signed   By: Karen Kays M.D.   On: 03/05/2023 17:50    Procedures Procedures    Medications Ordered in ED Medications  lactated ringers bolus 1,000 mL (1,000 mLs Intravenous New Bag/Given 03/05/23 1925)  meclizine (ANTIVERT) tablet 12.5 mg (12.5 mg Oral Given 03/05/23 1716)    ED Course/ Medical Decision Making/ A&P                                 Medical Decision Making Amount and/or Complexity of Data Reviewed Labs: ordered. Radiology: ordered.   This patient presents to the ED for concern of [name], this involves an extensive number of treatment options, and is a complaint that carries with it a high risk of complications and morbidity.  The differential diagnosis includes CVA, TIA, ICH, concussion, BPPV, vestibular neuritis, dehydration, anemia   Co morbidities that complicate the patient evaluation  HTN, GERD, CVA with residual left-sided weakness, COPD, CKD, anxiety, osteopenia, PAD   Additional history obtained:  Additional history obtained from N/A External records from outside source obtained and reviewed including EMR   Lab Tests:  I Ordered, and personally interpreted labs.  The pertinent results include: Creatinine is increased at baseline, lab work is otherwise unremarkable   Imaging Studies ordered:  I ordered imaging studies including chest x-ray, CT head I independently visualized and interpreted imaging which showed no acute findings I agree with the radiologist  interpretation   Cardiac Monitoring: / EKG:  The patient was maintained on a cardiac monitor.  I personally viewed and interpreted the cardiac monitored which showed an underlying rhythm of: Sinus rhythm  Problem List / ED Course / Critical interventions / Medication management  Patient presenting for [name] with standing and ambulation.  Symptoms have been present for the past 2 to 3 days.  She did have a recent head injury causing loss of consciousness.  She has been started on some new medications.  On exam, she is well-appearing.  Current vital signs are normal.  She has no focal neurologic deficits on exam.  She has no [name] at rest.  When stood up, she does experience symptoms of [name].  She does not have a drop in blood pressure when standing.  She reports recent poor p.o. intake due to loss of appetite.  On lab work, she does have an increase in creatinine from baseline.  IV fluids were ordered.  Patient improved symptoms after meclizine.  CT of head did not show any acute findings.  She was able to ambulate to the bathroom without difficulty.  She was informed of her increased creatinine.  She was advised to maintain good hydration at home and avoid NSAIDs.  She was prescribed meclizine to take as needed. I ordered medication including IV fluids for hydration; meclizine for [name] Reevaluation of the patient after these medicines showed that the patient improved I have reviewed the patients home medicines and have made adjustments as needed   Social Determinants of Health:  Has PCP         Final Clinical Impression(s) / ED Diagnoses Final diagnoses:  [name]  Rx / DC Orders ED Discharge Orders          Ordered    meclizine (ANTIVERT) 12.5 MG tablet  3 times daily PRN        03/05/23 1909              Gloris Manchester, MD 03/05/23 2007

## 2023-03-05 NOTE — Discharge Instructions (Addendum)
Your kidney function was down today.  Ensure that you stay hydrated at home.  Avoid use of your meloxicam or other NSAIDs.  Follow-up with your primary care doctor for repeat lab work.  A prescription for medication called meclizine was sent to your pharmacy.  Take this as needed for dizziness.  Return to the emergency department for any new or worsening symptoms of concern.

## 2023-03-08 DIAGNOSIS — J449 Chronic obstructive pulmonary disease, unspecified: Secondary | ICD-10-CM | POA: Diagnosis not present

## 2023-03-26 DIAGNOSIS — R1011 Right upper quadrant pain: Secondary | ICD-10-CM | POA: Diagnosis not present

## 2023-03-26 DIAGNOSIS — R6889 Other general symptoms and signs: Secondary | ICD-10-CM | POA: Diagnosis not present

## 2023-03-26 DIAGNOSIS — S2231XA Fracture of one rib, right side, initial encounter for closed fracture: Secondary | ICD-10-CM | POA: Diagnosis not present

## 2023-03-26 DIAGNOSIS — W1811XA Fall from or off toilet without subsequent striking against object, initial encounter: Secondary | ICD-10-CM | POA: Diagnosis not present

## 2023-03-26 DIAGNOSIS — I700 Atherosclerosis of aorta: Secondary | ICD-10-CM | POA: Diagnosis not present

## 2023-03-26 DIAGNOSIS — R079 Chest pain, unspecified: Secondary | ICD-10-CM | POA: Diagnosis not present

## 2023-03-26 DIAGNOSIS — Z87891 Personal history of nicotine dependence: Secondary | ICD-10-CM | POA: Diagnosis not present

## 2023-03-26 DIAGNOSIS — S3991XA Unspecified injury of abdomen, initial encounter: Secondary | ICD-10-CM | POA: Diagnosis not present

## 2023-03-26 DIAGNOSIS — Z743 Need for continuous supervision: Secondary | ICD-10-CM | POA: Diagnosis not present

## 2023-03-26 DIAGNOSIS — W19XXXA Unspecified fall, initial encounter: Secondary | ICD-10-CM | POA: Diagnosis not present

## 2023-03-26 DIAGNOSIS — I708 Atherosclerosis of other arteries: Secondary | ICD-10-CM | POA: Diagnosis not present

## 2023-03-26 DIAGNOSIS — Z5941 Food insecurity: Secondary | ICD-10-CM | POA: Diagnosis not present

## 2023-03-26 DIAGNOSIS — S301XXA Contusion of abdominal wall, initial encounter: Secondary | ICD-10-CM | POA: Diagnosis not present

## 2023-03-27 ENCOUNTER — Other Ambulatory Visit: Payer: 59

## 2023-03-27 ENCOUNTER — Other Ambulatory Visit: Payer: Self-pay | Admitting: *Deleted

## 2023-03-27 NOTE — Patient Outreach (Signed)
Care Management   Visit Note  03/27/2023 Name: [name] MRN: [id number] DOB: 1941/05/12  Subjective: [name] is a 82 y.o. year old female who is a primary care patient of Mechele Claude, MD. The Care Management team was consulted for assistance.      Engaged with patient spoke with patient by telephone.    Goals Addressed             This Visit's Progress    RNCM Care Plan (CONGESTIVE HEART FAILURE) EXPECTED OUTCOME: MONITOR, SELF-MANAGE AND REDUCE SYMPTOMS OF CONGESTIVE HEART FAILURE       Current Barriers:  Knowledge Deficits related to Congestive Heart Failure management Care Coordination needs related to application for PCS/ assistance in the home in a patient with CHF Chronic Disease Management support and education needs related to Congestive Heart Failure, diet No Advanced Directives in place- pt requests information Patient reports she lives with significant other [name] (he is going through his own health issues at present), pt reports she is overall independent but is starting to feel like she is having issues with bathing, sometimes her partner has to assist her, pt does not use any assistive devices, states she would consider a shower seat and this may be helpful. Patient states she plans to apply for PCS through medicaid benefit with assistance of Child psychotherapist.  Patient reports she quit smoking 4 months ago and has done well with this, pt uses [facility] transportation benefit for most all transportation needs. Patient reports she checks blood pressure daily , states "it's been good". 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   Wt Readings from Last 3 Encounters:  03/05/23 135 lb (61.2 kg)  03/04/23 134 lb 9.6 oz (61.1 kg)  01/23/23 135 lb 3.2 oz (61.3 kg)   Self Reported 03-27-2023 135 lbs  Planned Interventions: Discussed importance of daily weight and advised patient to weigh and record  daily Reviewed role of diuretics in prevention of fluid overload and management of heart failure Discussed the importance of keeping all appointments with provider Reinforced low sodium diet Reinforced Heart Failure action plan  Symptom Management: Take medications as prescribed   Attend all scheduled provider appointments Call pharmacy for medication refills 3-7 days in advance of running out of medications Attend church or other social activities Call provider office for new concerns or questions  Work with the social worker to address care coordination needs and will continue to work with the clinical team to address health care and disease management related needs call office if I gain more than 2 pounds in one day or 5 pounds in one week keep legs up while sitting track weight in diary use salt in moderation watch for swelling in feet, ankles and legs every day weigh myself daily eat more whole grains, fruits and vegetables, lean meats and healthy fats dress right for the weather, hot or cold Follow heart failure action plan  Follow Up Plan: Telephone follow up appointment with care management team member scheduled for: 05-10-2023 at 3:45 pm       RNCM Care Plan CVA EXPECTED OUTCOME: MONITOR, SELF-MANAGE AND REDUCE SYMPTOMS OF CVA       Current Barriers:  Knowledge Deficits related to CVA management Chronic Disease Management support and education needs related to CVA No Advanced Directives in place- documents previously mailed Patient 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 and states " they think I had  a light stroke when I went to ED on 7/12"  pt reports her blood pressure "was very high" Patient reports 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 Patient is drinking Ensure TID and eats at least 2 meals per day Patient stopped smoking 4 months ago No new concerns reported today  Planned Interventions: Reviewed Importance of taking all medications as  prescribed Reviewed Importance of attending all scheduled provider appointments Advised to report any changes in symptoms or exercise tolerance Screening for signs and symptoms of depression related to chronic disease state;  Assessed social determinant of health barriers Assessed for signs and symptoms of stroke Reviewed the importance of exercise Assessed for cognitive impairment Assessed for fall status and safety in the home Assessed use of tobacco use Reinforced importance of keeping blood pressure under good control for stroke prevention and overall healthy Encouragement given for smoking cessation  Symptom Management: Take medications as prescribed   Attend all scheduled provider appointments Call pharmacy for medication refills 3-7 days in advance of running out of medications Attend church or other social activities Call provider office for new concerns or questions  call the Suicide and Crisis Lifeline: 988 call the [place] National Suicide Prevention Lifeline: 213-543-6140 or TTY: 2093607670 TTY (205)377-6130) to talk to a trained counselor call 1-800-273-TALK (toll free, 24 hour hotline) go to [facility] [address], [place] [phone]) call the [facility] Crisis Line: 9705163125 call 911 if experiencing a Mental Health or Behavioral Health Crisis   Follow Up Plan: Telephone follow up appointment with care management team member scheduled for:  05-10-2023 at 3:45 pm       RNCM Care Plan Related to Falls/Safety       Current Barriers:  Equipment/DME No Current DME but wants to consider a cane Functional/Safety History of CVA with 2 falls in the last month Knowledge Deficits related to plan of care for management of Falls  Care Coordination needs related to ADL IADL limitations and Inability to perform IADL's independently Chronic Disease Management support and education needs related to Falls/Safety Concerns    RNCM Clinical Goal(s):  Patient will verbalize basic understanding of  falls and safety awareness related to  disease process and self health management plan as evidenced by no recurrent falls take all medications exactly as prescribed and will call provider for medication related questions as evidenced by medication adherence attend all scheduled medical appointments: with specialist and PCP as evidenced by making follow up appointment with PCP and keeping all specialist appointments as recommended. continue to work with RN Care Manager to address care management and care coordination needs related to  falls and safety as evidenced by adherence to CM Team Scheduled appointments work with social worker to address  related to the management of ADL IADL limitations related to the management of falls and safety awareness as evidenced by review of EMR and patient or social worker report experience decrease in ED visits as evidenced by EMR review.  ED visits in in last 6 months = 3  through collaboration with RN Care manager, provider, and care team.   Interventions: Evaluation of current treatment plan related to  self management and patient's adherence to plan as established by provider     12/24/2022   12:06 PM 01/23/2023   10:53 AM 01/23/2023   10:59 AM 03/04/2023   11:35 AM 03/04/2023   11:42 AM  Fall Risk  Falls in the past year? 1 1 1 1  1  Was there an injury with Fall? 1 1 0 0 0  Fall Risk Category Calculator 2 2 1 1 1   Patient at Risk for Falls Due to History of fall(s);Impaired balance/gait;Impaired mobility;Impaired vision;Mental status change History of fall(s) History of fall(s) History of fall(s) History of fall(s)  Fall risk Follow up Falls evaluation completed;Follow up appointment;Education provided;Falls prevention discussed  Falls evaluation completed Falls evaluation completed Falls evaluation completed     Falls Interventions:  (Status:  New goal. and Goal on track:  Yes.)  Long Term Goal Provided written and verbal education re: potential causes of falls and Fall prevention strategies Reviewed medications and discussed potential side effects of medications such as dizziness and frequent urination Advised patient of importance of notifying provider of falls Assessed for signs and symptoms of orthostatic hypotension Assessed for falls since last encounter Assessed patients knowledge of fall risk prevention secondary to previously provided education Provided patient information for fall alert systems Assessed working status of life alert bracelet and patient adherence Advised patient to discuss any new health concerns as they arise with provider Screening for signs and symptoms of depression related to chronic disease state  Assessed social determinant of health barriers  Patient Goals/Self-Care Activities: Take all medications as prescribed Attend all scheduled provider appointments Call pharmacy for medication refills 3-7 days in advance of running out of medications Attend church or other social activities Perform all self care activities independently  Perform IADL's (shopping, preparing meals, housekeeping, managing finances) independently Call provider office for new concerns or questions  Work with the social worker to address care coordination needs and will continue to work with the clinical team to address health care and disease management related needs call the Suicide and Crisis Lifeline: 988 call the [place] National Suicide Prevention Lifeline: 320-469-3885 or TTY: 865-334-7941 TTY 406 756 1420) to talk to a trained counselor call 1-800-273-TALK (toll free, 24 hour hotline) call the [facility]: [phone] call 911 if experiencing a Mental Health or Behavioral Health Crisis     Follow Up Plan:  Telephone follow up appointment with care management team member scheduled for:  05-10-2023 at 3:45 pm              Consent  to Services:  Patient was given information about care management services, agreed to services, and gave verbal consent to participate.   Plan: Telephone follow up appointment with care management team member scheduled for: 05-10-2023 at 3:45 pm  Danise Edge, BSN RN RN Care Manager  [facility]  Ambulatory Care Management  Direct Number: [phone]

## 2023-03-27 NOTE — Patient Instructions (Signed)
Visit Information  Thank you for taking time to visit with me today. Please don't hesitate to contact me if I can be of assistance to you before our next scheduled telephone appointment.  Following are the goals we discussed today:   Goals Addressed             This Visit's Progress    RNCM Care Plan (CONGESTIVE HEART FAILURE) EXPECTED OUTCOME: MONITOR, SELF-MANAGE AND REDUCE SYMPTOMS OF CONGESTIVE HEART FAILURE       Current Barriers:  Knowledge Deficits related to Congestive Heart Failure management Care Coordination needs related to application for PCS/ assistance in the home in a patient with CHF Chronic Disease Management support and education needs related to Congestive Heart Failure, diet No Advanced Directives in place- pt requests information Patient reports she lives with significant other [name] (he is going through his own health issues at present), pt reports she is overall independent but is starting to feel like she is having issues with bathing, sometimes her partner has to assist her, pt does not use any assistive devices, states she would consider a shower seat and this may be helpful. Patient states she plans to apply for PCS through medicaid benefit with assistance of Child psychotherapist.  Patient reports she quit smoking 4 months ago and has done well with this, pt uses [facility] transportation benefit for most all transportation needs. Patient reports she checks blood pressure daily , states "it's been good". 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   Wt Readings from Last 3 Encounters:  03/05/23 135 lb (61.2 kg)  03/04/23 134 lb 9.6 oz (61.1 kg)  01/23/23 135 lb 3.2 oz (61.3 kg)   Self Reported 03-27-2023 135 lbs  Planned Interventions: Discussed importance of daily weight and advised patient to weigh and record daily Reviewed role of diuretics in prevention of fluid overload and management of heart  failure Discussed the importance of keeping all appointments with provider Reinforced low sodium diet Reinforced Heart Failure action plan  Symptom Management: Take medications as prescribed   Attend all scheduled provider appointments Call pharmacy for medication refills 3-7 days in advance of running out of medications Attend church or other social activities Call provider office for new concerns or questions  Work with the social worker to address care coordination needs and will continue to work with the clinical team to address health care and disease management related needs call office if I gain more than 2 pounds in one day or 5 pounds in one week keep legs up while sitting track weight in diary use salt in moderation watch for swelling in feet, ankles and legs every day weigh myself daily eat more whole grains, fruits and vegetables, lean meats and healthy fats dress right for the weather, hot or cold Follow heart failure action plan  Follow Up Plan: Telephone follow up appointment with care management team member scheduled for: 05-10-2023 at 3:45 pm       RNCM Care Plan CVA EXPECTED OUTCOME: MONITOR, SELF-MANAGE AND REDUCE SYMPTOMS OF CVA       Current Barriers:  Knowledge Deficits related to CVA management Chronic Disease Management support and education needs related to CVA No Advanced Directives in place- documents previously mailed Patient 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 and states " they think I had a light stroke when I went to ED on 7/12"  pt reports her blood pressure "was very high"  Patient reports 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 Patient is drinking Ensure TID and eats at least 2 meals per day Patient stopped smoking 4 months ago No new concerns reported today  Planned Interventions: Reviewed Importance of taking all medications as prescribed Reviewed Importance of attending all scheduled provider appointments Advised to report any  changes in symptoms or exercise tolerance Screening for signs and symptoms of depression related to chronic disease state;  Assessed social determinant of health barriers Assessed for signs and symptoms of stroke Reviewed the importance of exercise Assessed for cognitive impairment Assessed for fall status and safety in the home Assessed use of tobacco use Reinforced importance of keeping blood pressure under good control for stroke prevention and overall healthy Encouragement given for smoking cessation  Symptom Management: Take medications as prescribed   Attend all scheduled provider appointments Call pharmacy for medication refills 3-7 days in advance of running out of medications Attend church or other social activities Call provider office for new concerns or questions  call the Suicide and Crisis Lifeline: 988 call the [place] National Suicide Prevention Lifeline: 6404214496 or TTY: 434-053-5793 TTY (406)828-7040) to talk to a trained counselor call 1-800-273-TALK (toll free, 24 hour hotline) go to [facility] [address] [place] [phone]) call the [facility] Crisis Line: 848-248-5346 call 911 if experiencing a Mental Health or Behavioral Health Crisis   Follow Up Plan: Telephone follow up appointment with care management team member scheduled for:  05-10-2023 at 3:45 pm       RNCM Care Plan Related to Falls/Safety       Current Barriers:  Equipment/DME No Current DME but wants to consider a cane Functional/Safety History of CVA with 2 falls in the last month Knowledge Deficits related to plan of care for management of Falls  Care Coordination needs related to ADL IADL limitations and Inability to perform IADL's independently Chronic Disease Management support and education needs related to Falls/Safety Concerns   RNCM Clinical Goal(s):  Patient will verbalize basic understanding of  falls and safety awareness related  to  disease process and self health management plan as evidenced by no recurrent falls take all medications exactly as prescribed and will call provider for medication related questions as evidenced by medication adherence attend all scheduled medical appointments: with specialist and PCP as evidenced by making follow up appointment with PCP and keeping all specialist appointments as recommended. continue to work with RN Care Manager to address care management and care coordination needs related to  falls and safety as evidenced by adherence to CM Team Scheduled appointments work with social worker to address  related to the management of ADL IADL limitations related to the management of falls and safety awareness as evidenced by review of EMR and patient or social worker report experience decrease in ED visits as evidenced by EMR review.  ED visits in in last 6 months = 3  through collaboration with RN Care manager, provider, and care team.   Interventions: Evaluation of current treatment plan related to  self management and patient's adherence to plan as established by provider     12/24/2022   12:06 PM 01/23/2023   10:53 AM 01/23/2023   10:59 AM 03/04/2023   11:35 AM 03/04/2023   11:42 AM  Fall Risk  Falls in the past year? 1 1 1 1 1   Was there an injury with Fall? 1 1 0 0 0  Fall Risk Category Calculator 2 2 1  1 1  Patient at Risk for Falls Due to History of fall(s);Impaired balance/gait;Impaired mobility;Impaired vision;Mental status change History of fall(s) History of fall(s) History of fall(s) History of fall(s)  Fall risk Follow up Falls evaluation completed;Follow up appointment;Education provided;Falls prevention discussed  Falls evaluation completed Falls evaluation completed Falls evaluation completed     Falls Interventions:  (Status:  New goal. and Goal on track:  Yes.) Long Term Goal Provided written and verbal education re: potential causes of falls and Fall prevention  strategies Reviewed medications and discussed potential side effects of medications such as dizziness and frequent urination Advised patient of importance of notifying provider of falls Assessed for signs and symptoms of orthostatic hypotension Assessed for falls since last encounter Assessed patients knowledge of fall risk prevention secondary to previously provided education Provided patient information for fall alert systems Assessed working status of life alert bracelet and patient adherence Advised patient to discuss any new health concerns as they arise with provider Screening for signs and symptoms of depression related to chronic disease state  Assessed social determinant of health barriers  Patient Goals/Self-Care Activities: Take all medications as prescribed Attend all scheduled provider appointments Call pharmacy for medication refills 3-7 days in advance of running out of medications Attend church or other social activities Perform all self care activities independently  Perform IADL's (shopping, preparing meals, housekeeping, managing finances) independently Call provider office for new concerns or questions  Work with the social worker to address care coordination needs and will continue to work with the clinical team to address health care and disease management related needs call the Suicide and Crisis Lifeline: 988 call the [place] National Suicide Prevention Lifeline: 442-621-0478 or TTY: 229-254-0279 TTY 806-772-3651) to talk to a trained counselor call 1-800-273-TALK (toll free, 24 hour hotline) call the [facility]: 5677649926 call 911 if experiencing a Mental Health or Behavioral Health Crisis     Follow Up Plan:  Telephone follow up appointment with care management team member scheduled for:  05-10-2023 at 3:45 pm           Our next appointment is by telephone on 05-10-2023 at 3:45 pm  Please call the care guide team at [phone] if  you need to cancel or reschedule your appointment.   If you are experiencing a Mental Health or Behavioral Health Crisis or need someone to talk to, please call the Suicide and Crisis Lifeline: 988 call the [place] National Suicide Prevention Lifeline: 925 372 7065 or TTY: 442-604-3028 TTY (986)864-7441) to talk to a trained counselor call 1-800-273-TALK (toll free, 24 hour hotline) call the [facility]: [phone] call 911   The patient verbalized understanding of instructions, educational materials, and care plan provided today and DECLINED offer to receive copy of patient instructions, educational materials, and care plan.   Telephone follow up appointment with care management team member scheduled for:  Danise Edge, BSN RN RN Care Manager  [facility]  Ambulatory Care Management  Direct Number: [phone]

## 2023-04-01 ENCOUNTER — Ambulatory Visit: Payer: Self-pay | Admitting: *Deleted

## 2023-04-01 NOTE — Progress Notes (Unsigned)
Patient name: [name] MRN: [id number] DOB: 12-18-1940 Sex: female  REASON FOR CONSULT: 6 month f/U renal artery stenosis  HPI: [name] is a 82 y.o. female, with history of hypertension, tobacco abuse, remote CVA that presents for follow-up of renal artery stenosis.  She was previously seen in the hospital and discharged on 09/20/2022 with hypertensive urgency as well as AKI on CKD and chest pain.  There was incidental finding of a left renal artery stenosis on CTA.  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  Previously seen on 10/23/2022 and duplex did not show any high-grade flow-limiting stenosis in the renal artery.   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   Stroke (HCC)    SLIGHT RT SIDE WEAKNESS 2001   Unintentional weight loss 03/19/2022    Past Surgical History:  Procedure Laterality Date   ABDOMINAL HYSTERECTOMY     ANTERIOR CERVICAL CORPECTOMY  03/07/2012   Procedure: ANTERIOR CERVICAL CORPECTOMY;  Surgeon: Barnett Abu, MD;  Location: [facility];  Service: Neurosurgery;  Laterality: N/A;  Cervical six-seven, cervical seven-thoracic one Anterior cervical decompression/diskectomy/fusion, with Cervical seven Corpectomy, reconstruction using Allograft and Alphatec plate   CATARACT EXTRACTION W/PHACO Left 04/12/2017   Procedure: CATARACT EXTRACTION PHACO AND INTRAOCULAR LENS PLACEMENT (IOC);  Surgeon: Fabio Pierce, MD;  Location: [facility];  Service: Ophthalmology;  Laterality: Left;  CDE: 3.82   CATARACT EXTRACTION W/PHACO Right 05/03/2017   Procedure: CATARACT EXTRACTION  PHACO AND INTRAOCULAR LENS PLACEMENT RIGHT EYE;  Surgeon: Fabio Pierce, MD;  Location: [facility];  Service: Ophthalmology;  Laterality: Right;  CDE: 3.89   MULTIPLE TOOTH EXTRACTIONS     TONSILLECTOMY      Family History  Adopted: Yes  Problem Relation Age of Onset   Bipolar disorder Daughter    Heart disease Daughter    Asthma Daughter    Bipolar disorder Son    Bipolar disorder Daughter    Bipolar disorder Daughter    Bipolar disorder Daughter    Hypertension Daughter    Heart disease Daughter    Drug abuse Daughter        OD    SOCIAL HISTORY: Social History   Socioeconomic History   Marital status: Widowed    Spouse name: Not on file   Number of children: 5   Years of education: 36   Highest education level: 12th grade  Occupational History   Occupation: retired    Comment: CNA  Tobacco Use   Smoking status: Former    Current packs/day: 0.00    Average packs/day: 0.5 packs/day for 61.8 years (30.9 ttl pk-yrs)    Types: Cigarettes    Start date: 52    Quit date: 03/24/2022    Years since quitting: 1.0    Passive exposure: Past   Smokeless tobacco: Never  Vaping Use   Vaping status: Never Used  Substance and Sexual Activity   Alcohol use: Yes    Comment: occasional   Drug use: No   Sexual activity: Not Currently  Partners: Male    Birth control/protection: Surgical  Other Topics Concern   Not on file  Social History Narrative   5 children living, 1 deceased.    Children live out of state.   Social Determinants of Health   Financial Resource Strain: Low Risk  (12/24/2022)   Overall Financial Resource Strain (CARDIA)    Difficulty of Paying Living Expenses: Not hard at all  Food Insecurity: Food Insecurity Present (12/28/2022)   Hunger Vital Sign    Worried About Running Out of Food in the Last Year: Often true    Ran Out of Food in the Last Year: Never true  Transportation Needs: No Transportation Needs (12/24/2022)   PRAPARE - Therapist, art (Medical): No    Lack of Transportation (Non-Medical): No  Physical Activity: Inactive (12/24/2022)   Exercise Vital Sign    Days of Exercise per Week: 0 days    Minutes of Exercise per Session: 0 min  Stress: No Stress Concern Present (12/24/2022)   [employer] of Occupational Health - Occupational Stress Questionnaire    Feeling of Stress : Only a little  Social Connections: Socially Isolated (12/24/2022)   Social Connection and Isolation Panel [NHANES]    Frequency of Communication with Friends and Family: More than three times a week    Frequency of Social Gatherings with Friends and Family: More than three times a week    Attends Religious Services: Never    Database administrator or Organizations: No    Attends Banker Meetings: Never    Marital Status: Widowed  Intimate Partner Violence: Not At Risk (12/24/2022)   Humiliation, Afraid, Rape, and Kick questionnaire    Fear of Current or Ex-Partner: No    Emotionally Abused: No    Physically Abused: No    Sexually Abused: No    Allergies  Allergen Reactions   Penicillins Anaphylaxis, Swelling, Rash and Other (See Comments)    Has patient had a PCN reaction causing immediate rash, facial/tongue/throat swelling, SOB or lightheadedness with hypotension: yes Has patient had a PCN reaction causing severe rash involving mucus membranes or skin necrosis: no Has patient had a PCN reaction that required hospitalization: yes Has patient had a PCN reaction occurring within the last 10 years: no If all of the above answers are "NO", then may proceed with Cephalosporin use.     Current Outpatient Medications  Medication Sig Dispense Refill   acetaminophen (TYLENOL) 500 MG tablet Take 2 tablets (1,000 mg total) by mouth 3 (three) times daily. 180 tablet PRN   albuterol (VENTOLIN HFA) 108 (90 Base) MCG/ACT inhaler Inhale 2 puffs into the lungs every 6 (six) hours as needed for wheezing or shortness of  breath. 1 each 11   alendronate (FOSAMAX) 70 MG tablet Take 1 tablet (70 mg total) by mouth every 7 (seven) days. Take with a full glass of water on an empty stomach. 13 tablet 3   amLODipine (NORVASC) 10 MG tablet Take 1 tablet (10 mg total) by mouth daily. 90 tablet 3   arformoterol (BROVANA) 15 MCG/2ML NEBU Take 2 mLs (15 mcg total) by nebulization 2 (two) times daily. 360 mL 3   atorvastatin (LIPITOR) 80 MG tablet Take 1 tablet (80 mg total) by mouth daily. 90 tablet 3   budesonide (PULMICORT) 0.5 MG/2ML nebulizer solution Take 2 mLs (0.5 mg total) by nebulization 2 (two) times daily. 360 mL 3   budesonide-formoterol (SYMBICORT) 160-4.5 MCG/ACT inhaler Inhale 2  puffs into the lungs 2 (two) times daily. 1 each 5   cloNIDine (CATAPRES) 0.1 MG tablet Take 1 tablet (0.1 mg total) by mouth 2 (two) times daily. For Blood Pressure 60 tablet 5   clopidogrel (PLAVIX) 75 MG tablet Take 1 tablet (75 mg total) by mouth daily. 90 tablet 3   Evolocumab (REPATHA SURECLICK) 140 MG/ML SOAJ Inject 140 mg into the skin every 14 (fourteen) days. 6 mL 3   ezetimibe (ZETIA) 10 MG tablet Take 1 tablet (10 mg total) by mouth daily. 90 tablet 3   furosemide (LASIX) 40 MG tablet Take 1 tablet (40 mg total) by mouth daily. For swelling 30 tablet 5   ipratropium-albuterol (DUONEB) 0.5-2.5 (3) MG/3ML SOLN Take 3 mLs by nebulization in the morning and at bedtime. 540 mL 3   isosorbide dinitrate (ISORDIL) 20 MG tablet Take 1 tablet (20 mg total) by mouth 3 (three) times daily. 90 tablet 2   meclizine (ANTIVERT) 12.5 MG tablet Take 1 tablet (12.5 mg total) by mouth 3 (three) times daily as needed for dizziness. 30 tablet 0   meloxicam (MOBIC) 7.5 MG tablet Take 1 tablet (7.5 mg total) by mouth daily. For joint and muscle pain 30 tablet 2   metoprolol succinate (TOPROL-XL) 50 MG 24 hr tablet Take 1 tablet (50 mg total) by mouth daily. For blood pressure control 30 tablet 2   olmesartan (BENICAR) 40 MG tablet Take 1 tablet (40  mg total) by mouth daily. For blood pressure 90 tablet 1   potassium chloride SA (KLOR-CON M) 20 MEQ tablet Take 1 tablet (20 mEq total) by mouth daily. For potassium replacement/ supplement 30 tablet 5   SUMAtriptan (IMITREX) 100 MG tablet Take one at onset of HA. May repeat in 2 hours if headache persists or recurs. Limit two per 24 hours 10 tablet 11   tiZANidine (ZANAFLEX) 4 MG tablet Take 1 tablet (4 mg total) by mouth every 6 (six) hours as needed for muscle spasms. 60 tablet 2   vitamin B-12 1000 MCG tablet Take 1 tablet (1,000 mcg total) by mouth daily. 30 tablet 2   Vitamin D, Ergocalciferol, (DRISDOL) 1.25 MG (50000 UNIT) CAPS capsule Take 1 capsule (50,000 Units total) by mouth every 7 (seven) days. 13 capsule 3   No current facility-administered medications for this visit.    REVIEW OF SYSTEMS:  [X]  denotes positive finding, [ ]  denotes negative finding Cardiac  Comments:  Chest pain or chest pressure:    Shortness of breath upon exertion:    Short of breath when lying flat:    Irregular heart rhythm:        Vascular    Pain in calf, thigh, or hip brought on by ambulation:    Pain in feet at night that wakes you up from your sleep:     Blood clot in your veins:    Leg swelling:         Pulmonary    Oxygen at home:    Productive cough:     Wheezing:         Neurologic    Sudden weakness in arms or legs:     Sudden numbness in arms or legs:     Sudden onset of difficulty speaking or slurred speech:    Temporary loss of vision in one eye:     Problems with dizziness:         Gastrointestinal    Blood in stool:     Vomited blood:  Genitourinary    Burning when urinating:     Blood in urine:        Psychiatric    Major depression:         Hematologic    Bleeding problems:    Problems with blood clotting too easily:        Skin    Rashes or ulcers:        Constitutional    Fever or chills:      PHYSICAL EXAM: There were no vitals filed for this  visit.   GENERAL: The patient is a well-nourished female, in no acute distress. The vital signs are documented above. CARDIAC: There is a regular rate and rhythm.  VASCULAR:  Bilateral femoral pulses palpable Bilateral AT pulses palpable PULMONARY: No respiratory distress. ABDOMEN: Soft and non-tender. MUSCULOSKELETAL: There are no major deformities or cyanosis. NEUROLOGIC: No focal weakness or paresthesias are detected. SKIN: There are no ulcers or rashes noted. PSYCHIATRIC: The patient has a normal affect.  DATA:   Renal artery duplex  Assessment/Plan:  82 y.o. female, with history of hypertension, tobacco abuse, remote CVA that presents for 6 month follow-up of renal artery stenosis.  She was previously seen in the hospital and discharged on 09/20/2022 with hypertensive urgency as well as AKI on CKD and chest pain.  She  was seen for suspected left renal artery stenosis.  Previous CTA showed plaque at the left renal artery origin.  Duplex did not confirm a high-grade stenosis greater than 60%.    Cephus Shelling, MD [facility] Office: [phone]
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
## 2023-04-02 ENCOUNTER — Ambulatory Visit (INDEPENDENT_AMBULATORY_CARE_PROVIDER_SITE_OTHER): Payer: 59 | Admitting: Vascular Surgery

## 2023-04-02 ENCOUNTER — Encounter: Payer: Self-pay | Admitting: Vascular Surgery

## 2023-04-02 ENCOUNTER — Encounter (HOSPITAL_COMMUNITY): Payer: 59

## 2023-04-02 ENCOUNTER — Ambulatory Visit (INDEPENDENT_AMBULATORY_CARE_PROVIDER_SITE_OTHER): Payer: 59

## 2023-04-02 ENCOUNTER — Ambulatory Visit: Payer: 59 | Admitting: Vascular Surgery

## 2023-04-02 VITALS — BP 207/99 | HR 72 | Temp 97.9°F | Ht 61.0 in | Wt 133.8 lb

## 2023-04-02 DIAGNOSIS — I701 Atherosclerosis of renal artery: Secondary | ICD-10-CM

## 2023-04-08 DIAGNOSIS — J449 Chronic obstructive pulmonary disease, unspecified: Secondary | ICD-10-CM | POA: Diagnosis not present

## 2023-04-11 ENCOUNTER — Other Ambulatory Visit: Payer: 59 | Admitting: *Deleted

## 2023-04-12 ENCOUNTER — Ambulatory Visit: Payer: Self-pay | Admitting: *Deleted

## 2023-04-12 ENCOUNTER — Encounter: Payer: Self-pay | Admitting: *Deleted
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
## 2023-04-12 NOTE — Patient Outreach (Addendum)
Care Coordination   Follow Up Visit Note   04/12/2023  Name: [name] MRN: [id number] DOB: 1940/12/14  [name] is a 82 y.o. year old female who sees Stacks, Broadus John, MD for primary care. I spoke with [name] by phone today.  What matters to the patients health and wellness today?  Receive Assistance Obtaining Personal Care Services.    Goals Addressed               This Visit's Progress     Receive Assistance Obtaining Personal Care Services. (pt-stated)   On track     Care Coordination Interventions:  Interventions Today    Flowsheet Row Most Recent Value  Chronic Disease   Chronic disease during today's visit Chronic Obstructive Pulmonary Disease (COPD), Hypertension (HTN), Chronic Kidney Disease/End Stage Renal Disease (ESRD), Other  [Inability to Perform Activities of Daily Living Independently, Recurrent Depression, Cigarette Nicotine Dependence, Generalized Anxiety Disorder & History of Transient Ischemic Attack]  General Interventions   General Interventions Discussed/Reviewed General Interventions Discussed, Labs, Vaccines, Doctor Visits, Annual Foot Exam, Health Screening, General Interventions Reviewed, Lipid Profile, Annual Eye Exam, Durable Medical Equipment (DME), [facility], Communication with, Level of Care  [Communication with Care Team Members]  Labs Hgb A1c every 3 months, Kidney Function  [Encouraged]  Vaccines COVID-19, Flu, Pneumonia, RSV, Shingles, Tetanus/Pertussis/Diphtheria  [Encouraged]  Doctor Visits Discussed/Reviewed Doctor Visits Discussed, Specialist, Doctor Visits Reviewed, Annual Wellness Visits, PCP  [Encouraged]  Health Screening Bone Density, Colonoscopy, Mammogram  [Encouraged]  Durable Medical Equipment (DME) BP Cuff, Walker, Other  [Eyeglasses, Cane, Scales, Reacher, Psychologist, prison and probation services Hose]  PCP/Specialist Visits Compliance with follow-up visit  [Encouraged]  Communication with PCP/Specialists, Charity fundraiser,  Pharmacists, Social Work  [Encouraged]  Level of Care Personal Care Services  [Encouraged Attendance at [employer] with Technical brewer from [employer] [facility], Scheduled on 04/02/2023.]  Applications Personal Care Services  [Eligibility to Be Determined on 04/02/2023.]  Exercise Interventions   Exercise Discussed/Reviewed Exercise Discussed, Assistive device use and maintanence, Exercise Reviewed, Physical Activity, Weight Managment  [Encouraged]  Physical Activity Discussed/Reviewed Physical Activity Discussed, Home Exercise Program (HEP), Physical Activity Reviewed, PREP, Gym, Types of exercise  [Encouraged]  Weight Management Weight maintenance  [Encouraged]  Education Interventions   Education Provided Provided Education  Provided Verbal Education On Nutrition, Foot Care, Eye Care, Labs, Blood Sugar Monitoring, Medication, Development worker, community, [facility], Exercise, Applications, Mental Health/Coping with Illness, When to see the doctor  [Encouraged]  Labs Reviewed Hgb A1c  Applications Personal Care Services  [Eligibility to Be Determined on 04/02/2023.]  Mental Health Interventions   Mental Health Discussed/Reviewed Mental Health Discussed, Anxiety, Depression, Grief and Loss, Mental Health Reviewed, Coping Strategies, Substance Abuse, Suicide, Crisis, Other  [Absence from Domestic Violence Confirmed]  Nutrition Interventions   Nutrition Discussed/Reviewed Nutrition Discussed, Nutrition Reviewed, Carbohydrate meal planning, Adding fruits and vegetables, Increasing proteins, Decreasing fats, Fluid intake, Decreasing salt, Portion sizes, Decreasing sugar intake, Supplemental nutrition  [Encouraged]  Pharmacy Interventions   Pharmacy Dicussed/Reviewed Pharmacy Topics Discussed, Medications and their functions, Pharmacy Topics Reviewed, Medication Adherence, Affording Medications  [Encouraged]  Medication Adherence --  [WNUUVO Medications as Prescribed.]  Safety  Interventions   Safety Discussed/Reviewed Safety Discussed, Safety Reviewed, Fall Risk, Home Safety  [Encouraged]  Home Safety Assistive Devices, Refer for community resources  [Encouraged]  Advanced Directive Interventions   Advanced Directives Discussed/Reviewed Advanced Directives Discussed  [Encouraged Initiation]       Active Listening & Reflection Utilized.  Verbalization of Feelings Encouraged.  Emotional Support Provided. Problem Solving Interventions Indicated. Solution-Focused Strategies Implemented. Acceptance & Commitment Therapy Initiated. Cognitive Behavioral Therapy Conducted. Client-Centered Therapy Performed. Encouraged Routine Engagement with Representative from Meals on Wheels, through Aging, Disability & Transit Services of [place] [phone]), in An Effort to Check Status of Application for Prepared Meal Delivery Services in The Home. CSW Collaboration with Representative from [facility] [phone]), to Check Eligibility Status.  ~ HIPAA Compliant Message Left on Voicemail.  Encouraged Routine Engagement with Representative from [facility] [phone]), to Check Status of Initial Home Assessment to Determine Eligibility for Personal Care Services, Initiated on 04/02/2023. Encouraged Routine Engagement with [name], Licensed Clinical Social Worker with [facility] [phone]), if You Have Questions, Need Assistance, or If Additional Social Work Needs Are Identified Between Now & Our Next Follow-Up Outreach Call, Scheduled on 05/01/2023 at 10:30 AM.        SDOH assessments and interventions completed:  Yes.  SDOH Interventions Today    Flowsheet Row Most Recent Value  SDOH Interventions   Food Insecurity Interventions Intervention Not Indicated  Housing Interventions Intervention Not Indicated  Transportation Interventions Intervention Not Indicated, Patient Resources  (Friends/Family), Payor Benefit  Utilities Interventions Intervention Not Indicated  Alcohol Usage Interventions Intervention Not Indicated (Score <7)  Depression Interventions/Treatment  Medication, Counseling, Currently on Treatment  Financial Strain Interventions Intervention Not Indicated  Physical Activity Interventions Patient Declined  Stress Interventions Intervention Not Indicated, Provide Counseling  Social Connections Interventions Intervention Not Indicated  Health Literacy Interventions Intervention Not Indicated     Care Coordination Interventions:  Yes, provided.   Follow up plan: Follow up call scheduled for 05/01/2023 at 10:30 am.  Encounter Outcome:  Patient Visit Completed.   [name], BSW, MSW, Printmaker Social Work Case Set designer Health  [facility], Population Health Direct Dial: [phone]  Fax: [phone] Email: [email or web address]

## 2023-04-15 ENCOUNTER — Encounter: Payer: Self-pay | Admitting: Family Medicine

## 2023-04-15 ENCOUNTER — Ambulatory Visit (INDEPENDENT_AMBULATORY_CARE_PROVIDER_SITE_OTHER): Payer: 59 | Admitting: Family Medicine

## 2023-04-15 VITALS — BP 179/82 | HR 67 | Temp 97.3°F | Ht 61.0 in | Wt 135.0 lb

## 2023-04-15 DIAGNOSIS — E782 Mixed hyperlipidemia: Secondary | ICD-10-CM | POA: Diagnosis not present

## 2023-04-15 DIAGNOSIS — I1A0 Resistant hypertension: Secondary | ICD-10-CM

## 2023-04-15 DIAGNOSIS — I10 Essential (primary) hypertension: Secondary | ICD-10-CM

## 2023-04-15 MED ORDER — FUROSEMIDE 40 MG PO TABS: 20.0000 mg | ORAL_TABLET | Freq: Every day | ORAL | 5 refills | Status: DC

## 2023-04-15 MED ORDER — METOPROLOL SUCCINATE ER 50 MG PO TB24: 25.0000 mg | ORAL_TABLET | Freq: Every day | ORAL | 2 refills | Status: DC

## 2023-04-15 NOTE — Progress Notes (Signed)
Subjective:  Patient ID: [name], female    DOB: Jul 22, 1940  Age: 82 y.o. MRN: [id number]  CC: Follow-up   HPI [name] presents for tired all the time BP too high, but meds make her feel bad. Three weeks ago she fell off the toilet. She was just sitting. Not off balance, no LOC. Just fell and hit her ribs on the side of the bath tub.  Now having right sided rib pain. Was told at [facility] they were fractured.   Encouraged daily use of clonidine due to elevated BP. Pharmacy report shows she only took it for30 days.    in for follow-up of elevated cholesterol. Doing well without complaints on current medication. Denies side effects of statin including myalgia and arthralgia and nausea. Currently no chest pain, shortness of breath or other cardiovascular related symptoms noted.  Not taking repatha. Ran out.     04/15/2023    9:43 AM 04/12/2023    4:06 PM 03/04/2023   11:42 AM  Depression screen PHQ 2/9  Decreased Interest 1 1 2   Down, Depressed, Hopeless 3 1 2   PHQ - 2 Score 4 2 4   Altered sleeping 3 2 2   Tired, decreased energy 2 2 2   Change in appetite 1 2 2   Feeling bad or failure about yourself  0 2 3  Trouble concentrating 1 2 3   Moving slowly or fidgety/restless 0 2 3  Suicidal thoughts 0 0   PHQ-9 Score 11 14 19   Difficult doing work/chores Somewhat difficult Somewhat difficult Not difficult at all    History [name] has a past medical history of Arthritis, Atypical chest pain (03/19/2022), CHF (congestive heart failure) (HCC), GERD (gastroesophageal reflux disease), Headache(784.0), Hepatitis, History of gout, Hypertension, Hypertensive encephalopathy, 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   She has a past surgical history that includes Abdominal hysterectomy; Anterior cervical corpectomy (03/07/2012); Tonsillectomy; Multiple tooth extractions; Cataract extraction w/PHACO (Left,  04/12/2017); and Cataract extraction w/PHACO (Right, 05/03/2017).   Her family history includes Asthma in her daughter; Bipolar disorder in her daughter, daughter, daughter, daughter, and son; Drug abuse in her daughter; Heart disease in her daughter and daughter; Hypertension in her daughter. She was adopted.She reports that she quit smoking about 12 months ago. Her smoking use included cigarettes. She started smoking about 62 years ago. She has a 30.9 pack-year smoking history. She has been exposed to tobacco smoke. She has never used smokeless tobacco. She reports that she does not currently use alcohol. She reports that she does not use drugs.    ROS Review of Systems  Constitutional:  Positive for fatigue.  HENT: Negative.    Eyes:  Negative for visual disturbance.  Respiratory:  Negative for shortness of breath.   Cardiovascular:  Negative for chest pain.  Gastrointestinal:  Negative for abdominal pain.  Musculoskeletal:  Negative for arthralgias.    Objective:  BP (!) 179/82   Pulse 67   Temp (!) 97.3 F (36.3 C)   Ht 5\' 1"  (1.549 m)   Wt 135 lb (61.2 kg)   SpO2 93%   BMI 25.51 kg/m   BP Readings from Last 3 Encounters:  04/15/23 (!) 179/82  04/02/23 (!) 207/99  03/05/23 (!) 166/61    Wt Readings from Last 3 Encounters:  04/15/23 135 lb (61.2 kg)  04/02/23 133 lb 12.8 oz (60.7 kg)  03/05/23 135 lb (61.2 kg)     Physical Exam Constitutional:  General: She is not in acute distress.    Appearance: She is well-developed.  Cardiovascular:     Rate and Rhythm: Normal rate and regular rhythm.  Pulmonary:     Breath sounds: Normal breath sounds.  Musculoskeletal:        General: Tenderness (right midaxillary line) present. Normal range of motion.  Skin:    General: Skin is warm and dry.  Neurological:     Mental Status: She is alert and oriented to person, place, and time.       Assessment & Plan:   [name] was seen today for follow-up.  Diagnoses and  all orders for this visit:  Essential hypertension with goal blood pressure less than 130/80  Mixed hyperlipidemia  Resistant hypertension  Other orders -     metoprolol succinate (TOPROL-XL) 50 MG 24 hr tablet; Take 0.5 tablets (25 mg total) by mouth daily. For blood pressure control -     furosemide (LASIX) 40 MG tablet; Take 0.5 tablets (20 mg total) by mouth daily. For swelling       I have discontinued [name]'s meclizine. I have also changed her metoprolol succinate and furosemide. Additionally, I am having her maintain her atorvastatin, amLODipine, isosorbide dinitrate, cyanocobalamin, ezetimibe, budesonide-formoterol, albuterol, acetaminophen, Vitamin D (Ergocalciferol), potassium chloride SA, olmesartan, clopidogrel, budesonide, arformoterol, ipratropium-albuterol, cloNIDine, Repatha SureClick, alendronate, SUMAtriptan, tiZANidine, and meloxicam.  Allergies as of 04/15/2023       Reactions   Penicillins Anaphylaxis, Swelling, Rash, Other (See Comments)   Has patient had a PCN reaction causing immediate rash, facial/tongue/throat swelling, SOB or lightheadedness with hypotension: yes Has patient had a PCN reaction causing severe rash involving mucus membranes or skin necrosis: no Has patient had a PCN reaction that required hospitalization: yes Has patient had a PCN reaction occurring within the last 10 years: no If all of the above answers are "NO", then may proceed with [name] use.        Medication List        Accurate as of April 15, 2023 10:36 AM. If you have any questions, ask your nurse or doctor.          STOP taking these medications    meclizine 12.5 MG tablet Commonly known as: ANTIVERT Stopped by: [name]       TAKE these medications    acetaminophen 500 MG tablet Commonly known as: TYLENOL Take 2 tablets (1,000 mg total) by mouth 3 (three) times daily.   albuterol 108 (90 Base) MCG/ACT inhaler Commonly known as:  VENTOLIN HFA Inhale 2 puffs into the lungs every 6 (six) hours as needed for wheezing or shortness of breath.   alendronate 70 MG tablet Commonly known as: FOSAMAX Take 1 tablet (70 mg total) by mouth every 7 (seven) days. Take with a full glass of water on an empty stomach.   amLODipine 10 MG tablet Commonly known as: NORVASC Take 1 tablet (10 mg total) by mouth daily.   arformoterol 15 MCG/2ML Nebu Commonly known as: BROVANA Take 2 mLs (15 mcg total) by nebulization 2 (two) times daily.   atorvastatin 80 MG tablet Commonly known as: LIPITOR Take 1 tablet (80 mg total) by mouth daily.   budesonide 0.5 MG/2ML nebulizer solution Commonly known as: PULMICORT Take 2 mLs (0.5 mg total) by nebulization 2 (two) times daily.   budesonide-formoterol 160-4.5 MCG/ACT inhaler Commonly known as: SYMBICORT Inhale 2 puffs into the lungs 2 (two) times daily.   cloNIDine 0.1 MG tablet Commonly known as:  CATAPRES Take 1 tablet (0.1 mg total) by mouth 2 (two) times daily. For Blood Pressure   clopidogrel 75 MG tablet Commonly known as: PLAVIX Take 1 tablet (75 mg total) by mouth daily.   cyanocobalamin 1000 MCG tablet Take 1 tablet (1,000 mcg total) by mouth daily.   ezetimibe 10 MG tablet Commonly known as: ZETIA Take 1 tablet (10 mg total) by mouth daily.   furosemide 40 MG tablet Commonly known as: LASIX Take 0.5 tablets (20 mg total) by mouth daily. For swelling What changed: how much to take Changed by: Olie Dibert   ipratropium-albuterol 0.5-2.5 (3) MG/3ML Soln Commonly known as: DUONEB Take 3 mLs by nebulization in the morning and at bedtime.   isosorbide dinitrate 20 MG tablet Commonly known as: ISORDIL Take 1 tablet (20 mg total) by mouth 3 (three) times daily.   meloxicam 7.5 MG tablet Commonly known as: MOBIC Take 1 tablet (7.5 mg total) by mouth daily. For joint and muscle pain   metoprolol succinate 50 MG 24 hr tablet Commonly known as: TOPROL-XL Take 0.5  tablets (25 mg total) by mouth daily. For blood pressure control What changed: how much to take Changed by: Viyan Rosamond   olmesartan 40 MG tablet Commonly known as: Benicar Take 1 tablet (40 mg total) by mouth daily. For blood pressure   potassium chloride SA 20 MEQ tablet Commonly known as: KLOR-CON M Take 1 tablet (20 mEq total) by mouth daily. For potassium replacement/ supplement   Repatha SureClick 140 MG/ML Soaj Generic drug: Evolocumab Inject 140 mg into the skin every 14 (fourteen) days.   SUMAtriptan 100 MG tablet Commonly known as: Imitrex Take one at onset of HA. May repeat in 2 hours if headache persists or recurs. Limit two per 24 hours   tiZANidine 4 MG tablet Commonly known as: ZANAFLEX Take 1 tablet (4 mg total) by mouth every 6 (six) hours as needed for muscle spasms.   Vitamin D (Ergocalciferol) 1.25 MG (50000 UNIT) Caps capsule Commonly known as: DRISDOL Take 1 capsule (50,000 Units total) by mouth every 7 (seven) days.       Cut back on metoprolol and furosemide to help with energy. Needs to take Cloidine diligently. May have to switch to something else if pharmacy report doesn't improve.   Follow-up: Return in about 1 month (around 05/15/2023).  Mechele Claude, M.D.

## 2023-04-18 ENCOUNTER — Other Ambulatory Visit: Payer: Self-pay

## 2023-04-18 DIAGNOSIS — I701 Atherosclerosis of renal artery: Secondary | ICD-10-CM

## 2023-04-30 ENCOUNTER — Encounter: Payer: 59 | Admitting: *Deleted

## 2023-05-01 ENCOUNTER — Ambulatory Visit: Payer: Self-pay | Admitting: *Deleted

## 2023-05-01 DIAGNOSIS — J449 Chronic obstructive pulmonary disease, unspecified: Secondary | ICD-10-CM | POA: Diagnosis not present

## 2023-05-01 NOTE — Patient Outreach (Signed)
Care Coordination   Follow Up Visit Note   05/01/2023  Name: [name] MRN: [id number] DOB: 1940/11/23  [name] is a 82 y.o. year old female who sees Stacks, Broadus John, MD for primary care. I spoke with [name] by phone today.  What matters to the patients health and wellness today?  Receive Assistance Obtaining Personal Care Services.    Goals Addressed               This Visit's Progress     Receive Assistance Obtaining Personal Care Services. (pt-stated)   On track     Care Coordination Interventions:  Interventions Today    Flowsheet Row Most Recent Value  Chronic Disease   Chronic disease during today's visit Chronic Obstructive Pulmonary Disease (COPD), Hypertension (HTN), Chronic Kidney Disease/End Stage Renal Disease (ESRD), Other  [Inability to Perform Activities of Daily Living Independently, Recurrent Depression, Cigarette Nicotine Dependence, Generalized Anxiety Disorder & History of Transient Ischemic Attack, Frequent Falls]  General Interventions   General Interventions Discussed/Reviewed General Interventions Discussed, General Interventions Reviewed, Communication with, Doctor Visits, [facility], Level of Care, Horticulturist, commercial (DME), Vaccines, Health Screening  [Communication with Care Team Members]  Labs Hgb A1c every 3 months, Kidney Function  [Encouraged]  Vaccines COVID-19, Flu, Pneumonia, RSV, Shingles, Tetanus/Pertussis/Diphtheria  [Encouraged]  Doctor Visits Discussed/Reviewed Doctor Visits Discussed, Doctor Visits Reviewed, Annual Wellness Visits, PCP, Specialist  [Encouraged Routine Engagement]  Health Screening Bone Density, Colonoscopy, Mammogram  [Encouraged]  Durable Medical Equipment (DME) BP Cuff, Other  [Scales, Prescription Eyeglasses]  PCP/Specialist Visits Compliance with follow-up visit  [Encouraged Routine Engagement]  Communication with PCP/Specialists, RN, Pharmacists, Social Work  [employer] Routine  Engagement]  Level of Care Adult Daycare, Air traffic controller, Assisted Living, Skilled Nursing Facility  [Confirmed Disinterest in Enrollment in Adult Day Care Program or Applying for Medicaid or Personal Care Services]  Applications Medicaid, Personal Care Services  [Confirmed Disinterest in Applying for Medicaid or Personal Care Services]  Exercise Interventions   Exercise Discussed/Reviewed Exercise Discussed, Assistive device use and maintanence, Exercise Reviewed, Physical Activity, Weight Managment  [Encouraged]  Physical Activity Discussed/Reviewed Physical Activity Discussed, PREP, Gym, Physical Activity Reviewed, Types of exercise, Home Exercise Program (HEP)  [Encouraged]  Weight Management Weight maintenance  [Encouraged]  Education Interventions   Education Provided Provided Education  [Encouraged Review]  Provided Engineer, petroleum On Nutrition, Air traffic controller, Development worker, community, Exercise, Medication, When to see the doctor, [facility], Mental Health/Coping with Illness  [Encouraged Consideration of Implementation Techniques Discussed.]  Ship broker, Personal Care Services  [Confirmed Disinterest in Applying for Medicaid or Personal Care Services]  Mental Health Interventions   Mental Health Discussed/Reviewed Mental Health Discussed, Grief and Loss, Substance Abuse, Mental Health Reviewed, Suicide, Coping Strategies, Crisis, Anxiety, Depression  [Assessed Mental Health Status]  Nutrition Interventions   Nutrition Discussed/Reviewed Nutrition Discussed, Fluid intake, Decreasing salt, Portion sizes, Supplemental nutrition, Nutrition Reviewed, Decreasing sugar intake, Carbohydrate meal planning, Increasing proteins, Decreasing fats, Adding fruits and vegetables  [Encouraged]  Pharmacy Interventions   Pharmacy Dicussed/Reviewed Pharmacy Topics Discussed, Pharmacy Topics Reviewed, Medications and their functions, Medication Adherence, Affording Medications  [Confirmed Medication  Compliance]  Medication Adherence --  [Confirmed Ability to [employer] Prescription Medications]  Safety Interventions   Safety Discussed/Reviewed Safety Discussed, Safety Reviewed, Fall Risk, Home Safety  [Encouraged Consideration of Home Safety Evaluation]  Home Safety Assistive Devices, Need for home safety assessment, Refer for home visit, Contact provider for referral to PT/OT, Refer for community resources, Contact home health agency  [  Encouraged Consideration of Home Health Physical & Occupational Therapy Services & Purchasing a Rollator Walker.]  Advanced Directive Interventions   Advanced Directives Discussed/Reviewed Advanced Directives Discussed, Advanced Directives Reviewed  [Encouraged Initiation of Advanced Directives, Offering to Mail Packet & Assist with Completion.]      Active Listening & Reflection Utilized.  Verbalization of Feelings Encouraged.  Emotional Support Provided. Acceptance & Commitment Therapy Performed. Cognitive Behavioral Therapy Initiated. Client-Centered Therapy Implemented. CSW Collaboration with Dr. Mechele Claude, Primary Care Provider with [facility] [phone]), Via Routed Note in Epic, to Report Frequent Falls, Unsteady Balance/Gait, Dizziness, Concussion & Broken Rib from Falls Sustained at Home. CSW Collaboration with Dr. Mechele Claude, Primary Care Provider with [facility] [phone]), Via Routed Note in Epic, to Request Orders for Home Health Physical & Occupational Therapy Services & Referral to Home Health Agency of Choice in [place], [place]. CSW Collaboration with Dr. Mechele Claude, Primary Care Provider with [facility] [phone]), Via Routed Note in Epic, to Request Order for Rollator Walker & Referral to [facility] Agency of Choice in [place], [place]. Encouraged Routine Engagement with Representative  from Meals on Wheels, through Aging, Disability & Transit Services of [place] [phone]), in An Effort to Check Status of Application for Prepared Meal Delivery Services in The Home. Encouraged Routine Engagement with Representative from [facility] [phone]), to Check Status of Initial Home Assessment to Determine Eligibility for Personal Care Services, Initiated on 04/02/2023. Encouraged Routine Engagement with [name], Licensed Clinical Social Worker with [facility] [phone]), if You Have Questions, Need Assistance, or If Additional Social Work Needs Are Identified Between Now & Our Next Follow-Up Outreach Call, Scheduled on 05/21/2023 at 9:00 AM.      SDOH assessments and interventions completed:  Yes.  Care Coordination Interventions:  Yes, provided.   Follow up plan: Follow up call scheduled for 05/21/2023 at 9:00 am.  Encounter Outcome:  Patient Visit Completed.   [name], BSW, MSW, Printmaker Social Work Case Set designer Health  [facility], Population Health Direct Dial: [phone]  Fax: [phone] Email: [email or web address]: [email or web address]

## 2023-05-01 NOTE — Patient Instructions (Signed)
Visit Information  Thank you for taking time to visit with me today. Please don't hesitate to contact me if I can be of assistance to you.   Following are the goals we discussed today:   Goals Addressed               This Visit's Progress     Receive Assistance Obtaining Personal Care Services. (pt-stated)   On track     Care Coordination Interventions:  Interventions Today    Flowsheet Row Most Recent Value  Chronic Disease   Chronic disease during today's visit Chronic Obstructive Pulmonary Disease (COPD), Hypertension (HTN), Chronic Kidney Disease/End Stage Renal Disease (ESRD), Other  [Inability to Perform Activities of Daily Living Independently, Recurrent Depression, Cigarette Nicotine Dependence, Generalized Anxiety Disorder & History of Transient Ischemic Attack, Frequent Falls]  General Interventions   General Interventions Discussed/Reviewed General Interventions Discussed, General Interventions Reviewed, Communication with, Doctor Visits, [facility], Level of Care, Horticulturist, commercial (DME), Vaccines, Health Screening  [Communication with Care Team Members]  Labs Hgb A1c every 3 months, Kidney Function  [Encouraged]  Vaccines COVID-19, Flu, Pneumonia, RSV, Shingles, Tetanus/Pertussis/Diphtheria  [Encouraged]  Doctor Visits Discussed/Reviewed Doctor Visits Discussed, Doctor Visits Reviewed, Annual Wellness Visits, PCP, Specialist  [Encouraged Routine Engagement]  Health Screening Bone Density, Colonoscopy, Mammogram  [Encouraged]  Durable Medical Equipment (DME) BP Cuff, Other  [Scales, Prescription Eyeglasses]  PCP/Specialist Visits Compliance with follow-up visit  [Encouraged Routine Engagement]  Communication with PCP/Specialists, RN, Pharmacists, Social Work  [employer] Routine Engagement]  Level of Care Adult Daycare, Air traffic controller, Assisted Living, Skilled Nursing Facility  [Confirmed Disinterest in Enrollment in Adult Day Care Program or Applying for  Medicaid or Personal Care Services]  Applications Medicaid, Personal Care Services  [Confirmed Disinterest in Applying for Medicaid or Personal Care Services]  Exercise Interventions   Exercise Discussed/Reviewed Exercise Discussed, Assistive device use and maintanence, Exercise Reviewed, Physical Activity, Weight Managment  [Encouraged]  Physical Activity Discussed/Reviewed Physical Activity Discussed, PREP, Gym, Physical Activity Reviewed, Types of exercise, Home Exercise Program (HEP)  [Encouraged]  Weight Management Weight maintenance  [Encouraged]  Education Interventions   Education Provided Provided Education  [Encouraged Review]  Provided Engineer, petroleum On Nutrition, Air traffic controller, Development worker, community, Exercise, Medication, When to see the doctor, [facility], Mental Health/Coping with Illness  [Encouraged Consideration of Implementation Techniques Discussed.]  Ship broker, Personal Care Services  [Confirmed Disinterest in Applying for Medicaid or Personal Care Services]  Mental Health Interventions   Mental Health Discussed/Reviewed Mental Health Discussed, Grief and Loss, Substance Abuse, Mental Health Reviewed, Suicide, Coping Strategies, Crisis, Anxiety, Depression  [Assessed Mental Health Status]  Nutrition Interventions   Nutrition Discussed/Reviewed Nutrition Discussed, Fluid intake, Decreasing salt, Portion sizes, Supplemental nutrition, Nutrition Reviewed, Decreasing sugar intake, Carbohydrate meal planning, Increasing proteins, Decreasing fats, Adding fruits and vegetables  [Encouraged]  Pharmacy Interventions   Pharmacy Dicussed/Reviewed Pharmacy Topics Discussed, Pharmacy Topics Reviewed, Medications and their functions, Medication Adherence, Affording Medications  [Confirmed Medication Compliance]  Medication Adherence --  [Confirmed Ability to [employer] Prescription Medications]  Safety Interventions   Safety Discussed/Reviewed Safety Discussed, Safety Reviewed,  Fall Risk, Home Safety  [Encouraged Consideration of Home Safety Evaluation]  Home Safety Assistive Devices, Need for home safety assessment, Refer for home visit, Contact provider for referral to PT/OT, Refer for community resources, Contact home health agency  [Encouraged Consideration of Home Health Physical & Occupational Therapy Services & Purchasing a Rollator Walker.]  Advanced Directive Interventions   Advanced Directives Discussed/Reviewed Advanced Directives Discussed, Advanced Directives Reviewed  [  Encouraged Initiation of Advanced Directives, Offering to [employer] & Assist with Completion.]      Active Listening & Reflection Utilized.  Verbalization of Feelings Encouraged.  Emotional Support Provided. Acceptance & Commitment Therapy Performed. Cognitive Behavioral Therapy Initiated. Client-Centered Therapy Implemented. CSW Collaboration with Dr. Mechele Claude, Primary Care Provider with [facility] [phone]), Via Routed Note in Epic, to Report Frequent Falls, Unsteady Balance/Gait, Dizziness, Concussion & Broken Rib from Falls Sustained at Home. CSW Collaboration with Dr. Mechele Claude, Primary Care Provider with [facility] [phone]), Via Routed Note in Epic, to Request Orders for Home Health Physical & Occupational Therapy Services & Referral to Home Health Agency of Choice in [place], [place]. CSW Collaboration with Dr. Mechele Claude, Primary Care Provider with [facility] [phone]), Via Routed Note in Epic, to Request Order for Rollator Walker & Referral to [facility] of Choice in [place], [place]. Encouraged Routine Engagement with Representative from [employer], through [facility] [place] [phone]), in An Effort to Check Status of Application for Prepared Meal Delivery  Services in The Home. Encouraged Routine Engagement with Representative from [employer] [facility] [phone]), to Check Status of Initial Home Assessment to Determine Eligibility for Personal Care Services, Initiated on 04/02/2023. Encouraged Routine Engagement with [name], Licensed Clinical Social Worker with [facility] [phone]), if You Have Questions, Need Assistance, or If Additional Social Work Needs Are Identified Between Now & Our Next Follow-Up Outreach Call, Scheduled on 05/21/2023 at 9:00 AM.      Our next appointment is by telephone on 05/21/2023 at 9:00 am.  Please call the care guide team at [phone] if you need to cancel or reschedule your appointment.   If you are experiencing a Mental Health or Behavioral Health Crisis or need someone to talk to, please call the Suicide and Crisis Lifeline: 988 call the [place] National Suicide Prevention Lifeline: 743-217-2512 or TTY: 781-458-0592 TTY 628-768-0521) to talk to a trained counselor call 1-800-273-TALK (toll free, 24 hour hotline) go to [facility]gent Care [address] [place] [phone]) call the [facility] Crisis Line: 726-747-5422 call 911  Patient verbalizes understanding of instructions and care plan provided today and agrees to view in MyChart. Active MyChart status and patient understanding of how to access instructions and care plan via MyChart confirmed with patient.     Telephone follow up appointment with care management team member scheduled for:   05/21/2023 at 9:00 am.  [name], BSW, MSW, LCSW  Embedded Practice Social Work Case Manager  [facility], Population Health Direct Dial: [phone]  Fax: [phone] Email: [email or web address]: [email or web address]

## 2023-05-06 ENCOUNTER — Ambulatory Visit (HOSPITAL_BASED_OUTPATIENT_CLINIC_OR_DEPARTMENT_OTHER): Payer: 59 | Admitting: Family

## 2023-05-06 ENCOUNTER — Encounter (HOSPITAL_BASED_OUTPATIENT_CLINIC_OR_DEPARTMENT_OTHER): Payer: Self-pay | Admitting: Family

## 2023-05-06 VITALS — BP 180/88 | HR 80 | Ht 61.9 in | Wt 138.9 lb

## 2023-05-06 DIAGNOSIS — I25118 Atherosclerotic heart disease of native coronary artery with other forms of angina pectoris: Secondary | ICD-10-CM | POA: Diagnosis not present

## 2023-05-06 DIAGNOSIS — I10 Essential (primary) hypertension: Secondary | ICD-10-CM

## 2023-05-06 DIAGNOSIS — E785 Hyperlipidemia, unspecified: Secondary | ICD-10-CM

## 2023-05-06 DIAGNOSIS — Z8673 Personal history of transient ischemic attack (TIA), and cerebral infarction without residual deficits: Secondary | ICD-10-CM

## 2023-05-06 DIAGNOSIS — J4489 Other specified chronic obstructive pulmonary disease: Secondary | ICD-10-CM | POA: Diagnosis not present

## 2023-05-06 DIAGNOSIS — I739 Peripheral vascular disease, unspecified: Secondary | ICD-10-CM

## 2023-05-06 MED ORDER — METOPROLOL SUCCINATE ER 25 MG PO TB24: 25.0000 mg | ORAL_TABLET | Freq: Every day | ORAL | 1 refills | Status: DC

## 2023-05-06 MED ORDER — FUROSEMIDE 20 MG PO TABS
20.0000 mg | ORAL_TABLET | Freq: Every day | ORAL | 1 refills | Status: DC
Start: 2023-05-06 — End: 2023-06-11

## 2023-05-06 MED ORDER — AMLODIPINE-OLMESARTAN 10-40 MG PO TABS
1.0000 | ORAL_TABLET | Freq: Every day | ORAL | 1 refills | Status: DC
Start: 2023-05-06 — End: 2023-06-11

## 2023-05-06 MED ORDER — ISOSORBIDE MONONITRATE ER 60 MG PO TB24
60.0000 mg | ORAL_TABLET | Freq: Every day | ORAL | 1 refills | Status: DC
Start: 2023-05-06 — End: 2023-06-11

## 2023-05-06 NOTE — Patient Instructions (Addendum)
Medication Instructions:   Your physician has recommended you make the following change in your medication:   STOP Amlodipine STOP Olmesartan  START Amlodipine-Olmesartan 10-40mg  one tablet daily This is a combination tablet to simplify your medication list  STOP Ezetimibe (Zetia) STOP Repatha  TAKE Atorvastatin 80mg  once daily We are trying just the Atorvastatin once per day to see how much better your cholesterol numbers will be on just one medication that you take consistently. This medication helps to prevent heart attacks, reduce the amount of plaque in your heart arteries, lower cholesterol, AND to prevent more plaque build up in your heart arteries.   We have sent in Furosemide (Lasix) 20mg  tablet daily and Metoprolol Succinate (Toprol) 25mg  tablet daily. You can continue taking a half tablet of what you currently have, but when you pick up the new prescription we sent it in for the lower dose.   STOP Isordil dinitrate  START Isosorbide mononitrate (Imdur) 60mg  one tablet daily This helps with blood pressure and prevents chest pain. It is extended release so you only have to take it once per day!  STOP Clonidine Hold onto these tablets but do not take for now. If your blood pressure is still too high, we might use in the future or use the patch version of this medication.     *If you need a refill on your cardiac medications before your next appointment, please call your pharmacy*  Follow-Up: At [facility], you and your health needs are our priority.  As part of our continuing mission to provide you with exceptional heart care, we have created designated Provider Care Teams.  These Care Teams include your primary Cardiologist (physician) and Advanced Practice Providers (APPs -  Physician Assistants and Nurse Practitioners) who all work together to provide you with the care you need, when you need it.  We recommend signing up for the patient portal called "MyChart".   Sign up information is provided on this After Visit Summary.  MyChart is used to connect with patients for Virtual Visits (Telemedicine).  Patients are able to view lab/test results, encounter notes, upcoming appointments, etc.  Non-urgent messages can be sent to your provider as well.   To learn more about what you can do with MyChart, go to [email or web address].    Your next appointment:   January 2025 with Alver Sorrow, NP    Other Instructions  Please bring pill bottles and blood pressure cuff to your next office visit.

## 2023-05-06 NOTE — Progress Notes (Signed)
Cardiology Office Note:  .   Date:  05/06/2023  ID:  EVALEE HUCKEBA, DOB 07/23/1940, MRN [id number] PCP: Mechele Claude, MD  [facility] Providers Cardiologist:  Chilton Si, MD    History of Present Illness: [name]   ANTANIYA GWOZDZ is a 82 y.o. female  with a hx of CAD, hyperlipidemia, penetrating aortic ulcer, CVA, COPD, hypertension, CKD 3A, GERD.   Established with Dr. Duke Salvia of 03/19/2022 due to atypical chest pain.  Cardiac CTA ordered and performed 03/29/2022 calcium score 396 placing her in the 85th percentile for age/sex/race matched control.  Moderate nonobstructive disease with no significant lesion by FFR.   Admitted 10/29 - 03/27/2022 after presenting with COPD exacerbation as well as AKI.  Her ARB and HCTZ were held.  She was treated with antibiotics, bronchodilators/nebulizer.  Subsequently established with pulmonology.   When seen 04/17/2022 hydralazine 25 mg twice daily initiated and later increased.  At visit 07/04/2022 hydrochlorothiazide 12.5 mg was added to her valsartan/HCTZ 160/12.5 mg QD.   Admitted 4/21 - 09/20/2022 with chest discomfort, hypertensive urgency.  Blood pressure initially up to 237.  Started on nicardipine drip.  CTA chest/abdomen/pelvis showed partially calcified atherosclerotic plaque with focal stenosis of the left renal artery with moderate atrophy of the left kidney.  Evaluated by [facility] Dr. Chestine Spore with good celiac trunk and SMA flow recommended to follow-up regarding renal stenosis in outpatient setting.  Valsartan and hydrochlorothiazide were discontinued due to renal function.  She was discharged on  amlodipine 10 mg daily, hydralazine 50 mg TID, isosorbide dinitrate 20 mg TID,  bisoprolol 5 mg daily.   Seen 10/15/22,  Zetia was added as LDL not at goal.   ED visit 12/07/22 with elevated BP which improved to 130 systolic without intervention.  CXR showed interstitial edema and she was given dose of Lasix. New age indeterminate infarct in left  banglia CVA.  No focal neurological deficits noted.  PCP has since ordered MRI brain which has not yet been performed.  She was referred to home health.  Last seen 12/31/2022 with BP 130/68.  Due to palpitations and new finding of age-indeterminate infarct of left basal ganglia by CT ZIO was placed but did not reveal atrial fibrillation.  His LDL remained above goal of less than 70 she was referred to [facility]. lipid clinic and started on Repatha.  She presents today for follow up in dependently. Notes over the last week in her midsternum feels like "gas all the time" like a "pinch". Occurring more often with movement. No noted relieving factors. Does also note recent falls (ED visits 03/05/23 and 03/26/23). Some falls were mechanical and some falls were related to lightheadedness. No near syncope, syncope.  Her PCP 04/15/23 cut back on Furosemide and Metoprolol to help with energy level and notes no improvement. Her blood pressure at home has been in the 130s -reports white coat hypertension though we reviewed that her BP in our clinic 12/2022 was well controlled She is concerned about multiple medications and polypharmacy - she is not taking Zetia, Isordil, Repatha, Clonidine, Atorvastatin.  ROS: Please see the history of present illness.    All other systems reviewed and are negative.   Studies Reviewed: .        Cardiac Studies & Procedures       ECHOCARDIOGRAM  ECHOCARDIOGRAM COMPLETE 09/17/2022  Narrative ECHOCARDIOGRAM REPORT    Patient Name:   SANAM DWIGGINS Date of Exam: 09/17/2022 Medical Rec #:  [id number]  Height:       61.0 in Accession #:    [id number]       Weight:       132.7 lb Date of Birth:  10/25/1940         BSA:          1.587 m Patient Age:    81 years         BP:           154/55 mmHg Patient Gender: F                HR:           78 bpm. [facility]am [name]  Procedure: 2D Echo, Cardiac Doppler and Color Doppler  Indications:    Chest Pain  R07.9  History:        Patient has prior history of Echocardiogram examinations, most recent 02/12/2012. COPD, TIA and Stroke; Risk Factors:Hypertension and Dyslipidemia. Hx of Tobacco abuse.  Sonographer:    Celesta Gentile RCS Referring Phys: [phone] DAVID TAT  IMPRESSIONS   1. Left ventricular ejection fraction, by estimation, is >75%. The left ventricle has hyperdynamic function. The left ventricle has no regional wall motion abnormalities. Left ventricular diastolic parameters are consistent with Grade I diastolic dysfunction (impaired relaxation). 2. Right ventricular systolic function is normal. The right ventricular size is normal. Tricuspid regurgitation signal is inadequate for assessing PA pressure. 3. The mitral valve is normal in structure. No evidence of mitral valve regurgitation. No evidence of mitral stenosis. 4. The aortic valve was not well visualized. Aortic valve regurgitation is not visualized. No aortic stenosis is present. 5. The inferior vena cava is normal in size with greater than 50% respiratory variability, suggesting right atrial pressure of 3 mmHg.  Comparison(s): No prior Echocardiogram.  FINDINGS Left Ventricle: Left ventricular ejection fraction, by estimation, is >75%. The left ventricle has hyperdynamic function. The left ventricle has no regional wall motion abnormalities. The left ventricular internal cavity size was normal in size. There is no left ventricular hypertrophy. Left ventricular diastolic parameters are consistent with Grade I diastolic dysfunction (impaired relaxation).  Right Ventricle: The right ventricular size is normal. No increase in right ventricular wall thickness. Right ventricular systolic function is normal. Tricuspid regurgitation signal is inadequate for assessing PA pressure.  Left Atrium: Left atrial size was normal in size.  Right Atrium: Right atrial size was normal in size.  Pericardium: There is no evidence of pericardial  effusion.  Mitral Valve: The mitral valve is normal in structure. There is mild thickening of the mitral valve leaflet(s). There is mild calcification of the mitral valve leaflet(s). No evidence of mitral valve regurgitation. No evidence of mitral valve stenosis.  Tricuspid Valve: The tricuspid valve is normal in structure. Tricuspid valve regurgitation is not demonstrated. No evidence of tricuspid stenosis.  Aortic Valve: The aortic valve was not well visualized. Aortic valve regurgitation is not visualized. No aortic stenosis is present.  Pulmonic Valve: The pulmonic valve was not well visualized. Pulmonic valve regurgitation is not visualized. No evidence of pulmonic stenosis.  Aorta: The aortic root is normal in size and structure.  Venous: The inferior vena cava is normal in size with greater than 50% respiratory variability, suggesting right atrial pressure of 3 mmHg.  IAS/Shunts: No atrial level shunt detected by color flow Doppler.   LEFT VENTRICLE PLAX 2D LVIDd:         4.10 cm   Diastology LVIDs:  2.00 cm   LV e' medial:    6.74 cm/s LV PW:         1.00 cm   LV E/e' medial:  8.7 LV IVS:        1.00 cm   LV e' lateral:   7.40 cm/s LVOT diam:     1.80 cm   LV E/e' lateral: 7.9 LV SV:         67 LV SV Index:   43 LVOT Area:     2.54 cm   RIGHT VENTRICLE RV S prime:     12.80 cm/s TAPSE (M-mode): 2.5 cm  LEFT ATRIUM             Index        RIGHT ATRIUM           Index LA diam:        3.10 cm 1.95 cm/m   RA Area:     15.50 cm LA Vol (A2C):   50.7 ml 31.95 ml/m  RA Volume:   42.00 ml  26.47 ml/m LA Vol (A4C):   48.6 ml 30.63 ml/m LA Biplane Vol: 50.0 ml 31.51 ml/m AORTIC VALVE LVOT Vmax:   130.00 cm/s LVOT Vmean:  93.200 cm/s LVOT VTI:    0.265 m  AORTA Ao Root diam: 2.80 cm  MITRAL VALVE MV Area (PHT): 2.95 cm    SHUNTS MV Decel Time: 257 msec    Systemic VTI:  0.26 m MV E velocity: 58.70 cm/s  Systemic Diam: 1.80 cm MV A velocity: 89.40 cm/s MV  E/A ratio:  0.66  Vishnu Priya Mallipeddi Electronically signed by Winfield Rast Mallipeddi Signature Date/Time: 09/17/2022/2:50:27 PM    Final    MONITORS  LONG TERM MONITOR (3-14 DAYS) 01/14/2023  Narrative 9 Day Zio Monitor  Quality: Fair.  Baseline artifact. Predominant rhythm: sinus rhythm Average heart rate: 80 bpm Max heart rate: 121 bpm Min heart rate: 48 bpm Pauses >2.5 seconds: none  Rare PACs and PVCs (<1%) 5 episodes of SVT   Tiffany C. Duke Salvia, MD, [facility] 02/17/2023 9:39 AM   CT SCANS  CT CORONARY MORPH W/CTA COR W/SCORE 03/30/2022  Addendum 03/30/2022  1:27 PM ADDENDUM REPORT: 03/30/2022 13:25  EXAM: OVER-READ INTERPRETATION  CT CHEST  The following report is an over-read performed by radiologist Dr. Duanne Guess of [facility] on 03/30/2022. This over-read does not include interpretation of cardiac or coronary anatomy or pathology. The coronary CTA interpretation by the cardiologist is attached.  COMPARISON:  05/15/2021  FINDINGS: Normal heart size. No pericardial effusion. Image thoracic aorta is nonaneurysmal. Central pulmonary vasculature within normal limits.  No adenopathy within the included chest. Imaged lung fields are clear. Mild centrilobular emphysema. Mild bronchial wall thickening. No acute bony or chest wall abnormality.  IMPRESSION: Mild centrilobular emphysema and mild bronchial wall thickening.  Emphysema (ICD10-J43.9).   Electronically Signed By: Duanne Guess D.O. On: 03/30/2022 13:25  Narrative CLINICAL DATA:  Chest pain  EXAM: Cardiac/Coronary CTA  TECHNIQUE: A non-contrast, gated CT scan was obtained with axial slices of 3 mm through the heart for calcium scoring. Calcium scoring was performed using the Agatston method. A 120 kV prospective, gated, contrast cardiac scan was obtained. Gantry rotation speed was 250 msecs and collimation was 0.6 mm. Two sublingual nitroglycerin tablets  (0.8 mg) were given. The 3D data set was reconstructed in 5% intervals of the 35-75% of the R-R cycle. Diastolic phases were analyzed on a dedicated workstation using MPR, MIP, and  VRT modes. The patient received 95 cc of contrast.  FINDINGS: Image quality: Fair due to significant slab artifact.  Noise artifact is: Limited.  Coronary Arteries:  Normal coronary origin.  Right dominance.  Left main: The left main is a large caliber vessel with a normal take off from the left coronary cusp that bifurcates to form a left anterior descending artery and a left circumflex artery. There is no plaque or stenosis.  Left anterior descending artery: The LAD gives off 1 patent diagonal branch. There is mild calcified plaque in the proximal, mid and distal LAD with associated stenosis of 25-49%. There is moderate mixed plaque in the mid D1 with associated stenosis of 50-69%.  Left circumflex artery: The LCX is non-dominant and gives rise to 3 OM branches. There is mild calcified plaque in the proximal LCx with associated stenosis of 25-49%.  Right coronary artery: The RCA is dominant with normal take off from the right coronary cusp. The vessel is diffusely diseased in the proximal and mid portion with mixed and calcified plaque. There is mild to moderate calcified plaque in the proximal RCA with associated stenosis of 25-49% but could be >50%. There is mild calcified plaque in the distal RCA with associated stenosis of 25-49%. the RCA terminates as a PDA and right posterolateral branch without evidence of plaque or stenosis.  Right Atrium: Right atrial size is within normal limits.  Right Ventricle: The right ventricular cavity is within normal limits.  Left Atrium: Left atrial size is normal in size with no left atrial appendage filling defect.  Left Ventricle: The ventricular cavity size is within normal limits. There are no stigmata of prior infarction. There is no  abnormal filling defect.  Pulmonary arteries: Normal in size without proximal filling defect.  Pulmonary veins: Normal pulmonary venous drainage.  Pericardium: Normal thickness with no significant effusion or calcium present.  Cardiac valves: The aortic valve is trileaflet without significant calcification. The mitral valve is normal structure without significant calcification.  Aorta: Normal caliber with scattered calcifications.  Extra-cardiac findings: See attached radiology report for non-cardiac structures.  IMPRESSION: 1. Coronary calcium score of 396. This was 85th percentile for age-, sex, and race-matched controls.  2.  Normal coronary origin with right dominance.  3.  Moderate atherosclerosis.  CAD RADS 3.  4. Consider symptom-guided anti-ischemic pharmacotherapy and risk factor modification per guideline-directed medical therapy.  5.  Study has been submitted for FFR analysis.  RECOMMENDATIONS: 1. CAD-RADS 0: No evidence of CAD (0%). Consider non-atherosclerotic causes of chest pain.  2. CAD-RADS 1: Minimal non-obstructive CAD (0-24%). Consider non-atherosclerotic causes of chest pain. Consider preventive therapy and risk factor modification.  3. CAD-RADS 2: Mild non-obstructive CAD (25-49%). Consider non-atherosclerotic causes of chest pain. Consider preventive therapy and risk factor modification.  4. CAD-RADS 3: Moderate stenosis. Consider symptom-guided anti-ischemic pharmacotherapy as well as risk factor modification per guideline directed care. Additional analysis with CT FFR will be submitted.  5. CAD-RADS 4: Severe stenosis. (70-99% or > 50% left main). Cardiac catheterization or CT FFR is recommended. Consider symptom-guided anti-ischemic pharmacotherapy as well as risk factor modification per guideline directed care. Invasive coronary angiography recommended with revascularization per published guideline statements.  6. CAD-RADS 5: Total  coronary occlusion (100%). Consider cardiac catheterization or viability assessment. Consider symptom-guided anti-ischemic pharmacotherapy as well as risk factor modification per guideline directed care.  7. CAD-RADS N: Non-diagnostic study. Obstructive CAD can't be excluded. Alternative evaluation is recommended.  Armanda Magic, MD  Electronically Signed: By:  Armanda Magic M.D. On: 03/30/2022 11:45          Risk Assessment/Calculations:     HYPERTENSION CONTROL Vitals:   05/06/23 0919 05/06/23 1305  BP: (!) 172/88 (!) 180/88    The patient's blood pressure is elevated above target today.  In order to address the patient's elevated BP: A current anti-hypertensive medication was adjusted today.; A new medication was prescribed today.; Follow up with general cardiology has been recommended.          Physical Exam:   VS:  BP (!) 180/88   Pulse 80   Ht 5' 1.9" (1.572 m)   Wt 138 lb 14.4 oz (63 kg)   SpO2 98%   BMI 25.49 kg/m    Wt Readings from Last 3 Encounters:  05/06/23 138 lb 14.4 oz (63 kg)  04/15/23 135 lb (61.2 kg)  04/02/23 133 lb 12.8 oz (60.7 kg)    Vitals:   05/06/23 0919 05/06/23 1305  BP: (!) 172/88 (!) 180/88  Pulse: 80   Height: 5' 1.9" (1.572 m)   Weight: 138 lb 14.4 oz (63 kg)   SpO2: 98%   BMI (Calculated): 25.5     GEN: Well nourished, well developed in no acute distress NECK: No JVD; No carotid bruits CARDIAC: RRR, no murmurs, rubs, gallops RESPIRATORY:  Clear to auscultation without rales, wheezing or rhonchi  ABDOMEN: Soft, non-tender, non-distended EXTREMITIES:  No edema; No deformity   ASSESSMENT AND PLAN: .    CAD/hyperlipidemia-cardiac CTA 03/29/2022 with coronary calcium score of 396 placing in the 85th percentile with moderate nonobstructive coronary artery disease by FFR.  Reports midsternal chest discomfort with mixed typical/atypical features. Not taking Isordil dinitrate, start Imdur 60mg  daily. Continue GDMT Plavix, atorvastatin  80mg  every day.Heart healthy diet and regular cardiovascular exercise encouraged.   Lipid management: On taking atorvastatin, Zetia, nor Repatha.  We discussed that the reason for lipid therapy has been escalated to include multiple agents was her numbers were not at goal.  She notes she has not been taking atorvastatin consistently for some time.  She is agreeable to resume atorvastatin 80 mg daily.  Plan to repeat lipid panel in a few months, can coordinate at follow up.    HTN - BP not at goal in setting of noncompliance. She is understandably overwhelmed by multiple medications.  Stop Amlodipine, stop Olmesartan. Start Amlodipine-Olmesartan 10-40mg  daily. Not taking Isordil dinitrate. Will instead Rx Imdur 60mg  daily for easier dosing.Could uptitrate in future. Not taking Clonidine, will not resume today but consider clonidine patch in future if additional agent needed Taking half tablet Toprol and Lasix at direction of PCP due to low energy, no improvement in energy, however prefers lower doses of medications. Will Rx Toprol 25mg  daily and Lasix 20mg  daily so she does not have to split pills in half.    PAD / RAS - Follows with Dr. Chestine Spore. No new claudication symptoms.    Hx of CVA - Continue plavix, atorvastatin. Prior monitor with no atrial fib.    CKD- Careful titration of diuretic and antihypertensive.     GERD- Continue to follow with PCP.    COPD/tobacco use- Follows with pulmonology. No signs of acute exacerbation. Congratulated on continued cessation.        Dispo: follow up in 4-6 weeks  Signed, Alver Sorrow, NP

## 2023-05-10 ENCOUNTER — Other Ambulatory Visit: Payer: Self-pay

## 2023-05-14 ENCOUNTER — Ambulatory Visit: Payer: Self-pay | Admitting: Family Medicine

## 2023-05-14 NOTE — Telephone Encounter (Signed)
  Chief Complaint: Hypertension Symptoms: high BP readings Frequency: today Pertinent Negatives: Patient denies chest pain, difficulty breathing Disposition: [] ED /[] Urgent Care (no appt availability in office) / [] Appointment(In office/virtual)/ []  [facility]/ [] Home Care/ [] Refused Recommended Disposition /[] [facility]/ [x]  Follow-up with PCP Additional Notes: pt has a appointment tomorrow for her high blood pressure. Reports two high BP readings tonight. Patient reports not skipping any doses but has new BP medications that were ordered last week but she just received them today. Per protocol, recommended to keep her appointment with her PCP and start her new medications. Patient instructed to call back tonight if she doesn't feel any improvement after starting her new medications. Patient verbalized understanding of plan. All questions answered.   Copied from CRM [id number]. Topic: Clinical - Pink Word Triage >> May 14, 2023  3:56 PM Lovey Newcomer R wrote: Reason for Triage: BP is 203/94 and [facility] advised for patient to contact the nurse/doctor to discuss what to do. Reason for Disposition  [1] Systolic BP  >= 200 OR Diastolic >= 120 AND [2] having NO cardiac or neurologic symptoms  Answer Assessment - Initial Assessment Questions 1. BLOOD PRESSURE: "What is the blood pressure?" "Did you take at least two measurements 5 minutes apart?"     203/94-224/110-patient is very uptight on the phone  2. ONSET: "When did you take your blood pressure?"     Prior to calling. This RN requested patient go ahead and recheck her BP with instructions to take some deep breathes and try to keep her arm still while the cuff is in 3. HOW: "How did you take your blood pressure?" (e.g., automatic home BP monitor, visiting nurse)     Automatic BP 4. HISTORY: "Do you have a history of high blood pressure?"     Yes has a history of high blood pressure 5. MEDICINES: "Are you taking any medicines  for blood pressure?" "Have you missed any doses recently?"     Has blood pressure medication-was started on new medication. Hasn't missed any doses 6. OTHER SYMPTOMS: "Do you have any symptoms?" (e.g., blurred vision, chest pain, difficulty breathing, headache, weakness)     jittery  Protocols used: Blood Pressure - High-A-AH

## 2023-05-15 ENCOUNTER — Encounter: Payer: Self-pay | Admitting: Family Medicine

## 2023-05-15 ENCOUNTER — Ambulatory Visit (INDEPENDENT_AMBULATORY_CARE_PROVIDER_SITE_OTHER): Payer: 59 | Admitting: Family Medicine

## 2023-05-15 VITALS — BP 188/80 | HR 56 | Temp 97.7°F | Ht 61.9 in | Wt 132.6 lb

## 2023-05-15 DIAGNOSIS — I10 Essential (primary) hypertension: Secondary | ICD-10-CM | POA: Diagnosis not present

## 2023-05-15 DIAGNOSIS — I509 Heart failure, unspecified: Secondary | ICD-10-CM

## 2023-05-15 DIAGNOSIS — J4489 Other specified chronic obstructive pulmonary disease: Secondary | ICD-10-CM

## 2023-05-15 MED ORDER — CLONIDINE 0.1 MG/24HR TD PTWK: 0.1000 mg | MEDICATED_PATCH | TRANSDERMAL | 12 refills | Status: DC

## 2023-05-15 NOTE — Progress Notes (Signed)
Subjective:  Patient ID: [name], female    DOB: Sep 02, 1940  Age: 82 y.o. MRN: [id number]  CC: Follow-up and Chest Pain (RIB PAIN FROM FALL/)   HPI [name] presents for continued pain at the lower right ribs.  Also has had resistant HTN, but doesn't want to take meds. This, even though [name] has had stroke and CHF.      05/15/2023   10:16 AM 05/15/2023    9:35 AM 04/15/2023    9:43 AM  Depression screen PHQ 2/9  Decreased Interest 0 0 1  Down, Depressed, Hopeless 2 0 3  PHQ - 2 Score 2 0 4  Altered sleeping 2  3  Tired, decreased energy 2  2  Change in appetite 2  1  Feeling bad or failure about yourself  0  0  Trouble concentrating 2  1  Moving slowly or fidgety/restless 0  0  Suicidal thoughts 0  0  PHQ-9 Score 10  11  Difficult doing work/chores Not difficult at all  Somewhat difficult    History Carmello has a past medical history of Arthritis, Atypical chest pain (03/19/2022), CHF (congestive heart failure) (HCC), GERD (gastroesophageal reflux disease), Headache(784.0), Hepatitis, History of gout, Hypertension, Hypertensive encephalopathy, 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   [name] has a past surgical history that includes Abdominal hysterectomy; Anterior cervical corpectomy (03/07/2012); Tonsillectomy; Multiple tooth extractions; Cataract extraction w/PHACO (Left, 04/12/2017); and Cataract extraction w/PHACO (Right, 05/03/2017).   Her family history includes Asthma in her daughter; Bipolar disorder in her daughter, daughter, daughter, daughter, and son; Drug abuse in her daughter; Heart disease in her daughter and daughter; Hypertension in her daughter. [name] was adopted.[name] reports that [name] quit smoking about 13 months ago. Her smoking use included cigarettes. [name] started smoking about 63 years ago. [name] has a 30.9 pack-year smoking history. [name] has been exposed to tobacco smoke. [name]  has never used smokeless tobacco. [name] reports that [name] does not currently use alcohol. [name] reports that [name] does not use drugs.    ROS Review of Systems  Constitutional: Negative.   HENT: Negative.    Eyes:  Negative for visual disturbance.  Respiratory:  Positive for shortness of breath.   Cardiovascular:  Negative for chest pain.  Gastrointestinal:  Negative for abdominal pain.  Musculoskeletal:  Negative for arthralgias.    Objective:  BP (!) 188/80   Pulse (!) 56   Temp 97.7 F (36.5 C)   Ht 5' 1.9" (1.572 m)   Wt 132 lb 9.6 oz (60.1 kg)   SpO2 99%   BMI 24.33 kg/m   BP Readings from Last 3 Encounters:  05/15/23 (!) 188/80  05/06/23 (!) 180/88  04/15/23 (!) 179/82    Wt Readings from Last 3 Encounters:  05/15/23 132 lb 9.6 oz (60.1 kg)  05/06/23 138 lb 14.4 oz (63 kg)  04/15/23 135 lb (61.2 kg)     Physical Exam Constitutional:      General: [name] is not in acute distress.    Appearance: [name] is well-developed.  HENT:     Head: Normocephalic and atraumatic.  Eyes:     Conjunctiva/sclera: Conjunctivae normal.     Pupils: Pupils are equal, round, and reactive to light.  Neck:     Thyroid: No thyromegaly.  Cardiovascular:     Rate and Rhythm: Normal rate and regular rhythm.     Heart sounds: Normal heart sounds. No murmur heard. Pulmonary:  Effort: Pulmonary effort is normal. No respiratory distress.     Breath sounds: Normal breath sounds. No wheezing or rales.  Abdominal:     General: Bowel sounds are normal. There is no distension.     Palpations: Abdomen is soft.     Tenderness: There is no abdominal tenderness.  Musculoskeletal:        General: Normal range of motion.     Cervical back: Normal range of motion and neck supple.  Lymphadenopathy:     Cervical: No cervical adenopathy.  Skin:    General: Skin is warm and dry.  Neurological:     Mental Status: [name] is alert and oriented to person, place, and time.  Psychiatric:        Behavior:  Behavior normal.        Thought Content: Thought content normal.        Judgment: Judgment normal.       Assessment & Plan:   [name] was seen today for follow-up and chest pain.  Diagnoses and all orders for this visit:  Essential hypertension with goal blood pressure less than 130/80  COPD with chronic bronchitis (HCC)  Congestive heart failure, unspecified HF chronicity, unspecified heart failure type (HCC)  Other orders -     cloNIDine (CATAPRES - DOSED IN MG/24 HR) 0.1 mg/24hr patch; Place 1 patch (0.1 mg total) onto the skin once a week.       I have discontinued Wilene V. Glasner's metoprolol succinate. I am also having her start on cloNIDine. Additionally, I am having her maintain her atorvastatin, cyanocobalamin, budesonide-formoterol, albuterol, acetaminophen, Vitamin D (Ergocalciferol), potassium chloride SA, clopidogrel, budesonide, arformoterol, ipratropium-albuterol, alendronate, SUMAtriptan, tiZANidine, meloxicam, amLODipine-olmesartan, isosorbide mononitrate, and furosemide.  Allergies as of 05/15/2023       Reactions   Penicillins Anaphylaxis, Swelling, Rash, Other (See Comments)   Has patient had a PCN reaction causing immediate rash, facial/tongue/throat swelling, SOB or lightheadedness with hypotension: yes Has patient had a PCN reaction causing severe rash involving mucus membranes or skin necrosis: no Has patient had a PCN reaction that required hospitalization: yes Has patient had a PCN reaction occurring within the last 10 years: no If all of the above answers are "NO", then may proceed with Cephalosporin use.        Medication List        Accurate as of May 15, 2023 11:59 PM. If you have any questions, ask your nurse or doctor.          STOP taking these medications    metoprolol succinate 25 MG 24 hr tablet Commonly known as: TOPROL-XL Stopped by: Shonteria Abeln       TAKE these medications    acetaminophen 500 MG  tablet Commonly known as: TYLENOL Take 2 tablets (1,000 mg total) by mouth 3 (three) times daily.   albuterol 108 (90 Base) MCG/ACT inhaler Commonly known as: VENTOLIN HFA Inhale 2 puffs into the lungs every 6 (six) hours as needed for wheezing or shortness of breath.   alendronate 70 MG tablet Commonly known as: FOSAMAX Take 1 tablet (70 mg total) by mouth every 7 (seven) days. Take with a full glass of water on an empty stomach.   amLODipine-olmesartan 10-40 MG tablet Commonly known as: AZOR Take 1 tablet by mouth daily. For blood pressure.   arformoterol 15 MCG/2ML Nebu Commonly known as: BROVANA Take 2 mLs (15 mcg total) by nebulization 2 (two) times daily.   atorvastatin 80 MG tablet Commonly known as: LIPITOR  Take 1 tablet (80 mg total) by mouth daily.   budesonide 0.5 MG/2ML nebulizer solution Commonly known as: PULMICORT Take 2 mLs (0.5 mg total) by nebulization 2 (two) times daily.   budesonide-formoterol 160-4.5 MCG/ACT inhaler Commonly known as: SYMBICORT Inhale 2 puffs into the lungs 2 (two) times daily.   cloNIDine 0.1 mg/24hr patch Commonly known as: CATAPRES - Dosed in mg/24 hr Place 1 patch (0.1 mg total) onto the skin once a week. Started by: Broadus John Irlene Crudup   clopidogrel 75 MG tablet Commonly known as: PLAVIX Take 1 tablet (75 mg total) by mouth daily.   cyanocobalamin 1000 MCG tablet Take 1 tablet (1,000 mcg total) by mouth daily.   furosemide 20 MG tablet Commonly known as: LASIX Take 1 tablet (20 mg total) by mouth daily. For swelling   ipratropium-albuterol 0.5-2.5 (3) MG/3ML Soln Commonly known as: DUONEB Take 3 mLs by nebulization in the morning and at bedtime.   isosorbide mononitrate 60 MG 24 hr tablet Commonly known as: IMDUR Take 1 tablet (60 mg total) by mouth daily. For blood pressure and to prevent chest pain.   meloxicam 7.5 MG tablet Commonly known as: MOBIC Take 1 tablet (7.5 mg total) by mouth daily. For joint and muscle  pain   potassium chloride SA 20 MEQ tablet Commonly known as: KLOR-CON M Take 1 tablet (20 mEq total) by mouth daily. For potassium replacement/ supplement   SUMAtriptan 100 MG tablet Commonly known as: Imitrex Take one at onset of HA. May repeat in 2 hours if headache persists or recurs. Limit two per 24 hours   tiZANidine 4 MG tablet Commonly known as: ZANAFLEX Take 1 tablet (4 mg total) by mouth every 6 (six) hours as needed for muscle spasms.   Vitamin D (Ergocalciferol) 1.25 MG (50000 UNIT) Caps capsule Commonly known as: DRISDOL Take 1 capsule (50,000 Units total) by mouth every 7 (seven) days.         Follow-up: Return in about 3 months (around 08/13/2023).  Mechele Claude, M.D.

## 2023-05-19 ENCOUNTER — Encounter: Payer: Self-pay | Admitting: Family Medicine

## 2023-05-21 ENCOUNTER — Ambulatory Visit: Payer: Self-pay | Admitting: *Deleted

## 2023-05-21 NOTE — Patient Outreach (Signed)
Care Coordination   Follow Up Visit Note   05/21/2023  Name: [name] MRN: [id number] DOB: 09-29-1940  [name] is a 82 y.o. year old female who sees Stacks, Broadus John, MD for primary care. I spoke with [name] by phone today.  What matters to the patients health and wellness today?  Receive Assistance Obtaining Personal Care Services.    Goals Addressed               This Visit's Progress     Receive Assistance Obtaining Personal Care Services. (pt-stated)   On track     Care Coordination Interventions:  Interventions Today    Flowsheet Row Most Recent Value  Chronic Disease   Chronic disease during today's visit Chronic Obstructive Pulmonary Disease (COPD), Hypertension (HTN), Chronic Kidney Disease/End Stage Renal Disease (ESRD), Other  [Inability to Perform Activities of Daily Living Independently, Recurrent Depression, Cigarette Nicotine Dependence, Generalized Anxiety Disorder & History of Transient Ischemic Attack, Frequent Falls]  General Interventions   General Interventions Discussed/Reviewed General Interventions Discussed, General Interventions Reviewed, Communication with, Doctor Visits, [facility], Level of Care, Horticulturist, commercial (DME), Vaccines, Health Screening  [Communication with Care Team Members]  Labs Hgb A1c every 3 months, Kidney Function  [Encouraged]  Vaccines COVID-19, Flu, Pneumonia, RSV, Shingles, Tetanus/Pertussis/Diphtheria  [Encouraged]  Doctor Visits Discussed/Reviewed Doctor Visits Discussed, Doctor Visits Reviewed, Annual Wellness Visits, PCP, Specialist  [Encouraged Routine Engagement]  Health Screening Bone Density, Colonoscopy, Mammogram  [Encouraged]  Durable Medical Equipment (DME) BP Cuff, Other  [Scales, Prescription Eyeglasses]  PCP/Specialist Visits Compliance with follow-up visit  [Encouraged Routine Engagement]  Communication with PCP/Specialists, RN, Pharmacists, Social Work  [employer] Routine  Engagement]  Level of Care Adult Daycare, Air traffic controller, Assisted Living, Skilled Nursing Facility  [Confirmed Disinterest in Enrollment in Adult Day Care Program or Applying for Medicaid or Personal Care Services]  Applications Medicaid, Personal Care Services  [Confirmed Disinterest in Applying for Medicaid or Personal Care Services]  Exercise Interventions   Exercise Discussed/Reviewed Exercise Discussed, Assistive device use and maintanence, Exercise Reviewed, Physical Activity, Weight Managment  [Encouraged]  Physical Activity Discussed/Reviewed Physical Activity Discussed, PREP, Gym, Physical Activity Reviewed, Types of exercise, Home Exercise Program (HEP)  [Encouraged]  Weight Management Weight maintenance  [Encouraged]  Education Interventions   Education Provided Provided Education  [Encouraged Review]  Provided Engineer, petroleum On Nutrition, Air traffic controller, Development worker, community, Exercise, Medication, When to see the doctor, [facility], Mental Health/Coping with Illness  [Encouraged Consideration of Implementation Techniques Discussed.]  Ship broker, Personal Care Services  [Confirmed Disinterest in Applying for Medicaid or Personal Care Services]  Mental Health Interventions   Mental Health Discussed/Reviewed Mental Health Discussed, Grief and Loss, Substance Abuse, Mental Health Reviewed, Suicide, Coping Strategies, Crisis, Anxiety, Depression  [Assessed Mental Health Status]  Nutrition Interventions   Nutrition Discussed/Reviewed Nutrition Discussed, Fluid intake, Decreasing salt, Portion sizes, Supplemental nutrition, Nutrition Reviewed, Decreasing sugar intake, Carbohydrate meal planning, Increasing proteins, Decreasing fats, Adding fruits and vegetables  [Encouraged]  Pharmacy Interventions   Pharmacy Dicussed/Reviewed Pharmacy Topics Discussed, Pharmacy Topics Reviewed, Medications and their functions, Medication Adherence, Affording Medications  [Confirmed Medication  Compliance]  Medication Adherence --  [Confirmed Ability to [employer] Prescription Medications]  Safety Interventions   Safety Discussed/Reviewed Safety Discussed, Safety Reviewed, Fall Risk, Home Safety  [Encouraged Consideration of Home Safety Evaluation]  Home Safety Assistive Devices, Need for home safety assessment, Refer for home visit, Contact provider for referral to PT/OT, Refer for community resources, Contact home health agency  [  Encouraged Consideration of Home Health Physical & Occupational Therapy Services & Purchasing a Rollator Walker.]  Advanced Directive Interventions   Advanced Directives Discussed/Reviewed Advanced Directives Discussed, Advanced Directives Reviewed  [Encouraged Initiation of Advanced Directives, Offering to Mail Packet & Assist with Completion.]      Active Listening & Reflection Utilized.  Verbalization of Feelings Encouraged.  Emotional Support Provided. Cognitive Behavioral Therapy Indicated. Acceptance & Commitment Therapy Initiated. Client-Centered Therapy Performed. Encouraged Routine Engagement with Representative from Meals on Wheels, through Aging, Disability & Transit Services of [place] [phone]), in An Effort to Check Status of Application for Prepared Meal Delivery Services in The Home. Encouraged Routine Engagement with Representative from [employer] [facility] [phone]), to Check Status of Initial Home Assessment to Determine Eligibility for Personal Care Services, Initiated on 04/02/2023. Encouraged Routine Engagement with [name], Licensed Clinical Social Worker with [facility] [phone]), if You Have Questions, Need Assistance, or If Additional Social Work Needs Are Identified Between Now & Our Next Follow-Up Outreach Call, Scheduled on 06/18/2023 at 3:30 PM.       SDOH assessments and interventions completed:  Yes.  Care Coordination Interventions:  Yes, provided.   Follow up  plan: Follow up call scheduled for 06/18/2023 at 3:30 pm.  Encounter Outcome:  Patient Visit Completed.   [name], BSW, MSW, Printmaker Social Work Case Set designer Health  [facility], Population Health Direct Dial: [phone]  Fax: [phone] Email: [email or web address]: [email or web address]

## 2023-05-21 NOTE — Patient Instructions (Signed)
Visit Information  Thank you for taking time to visit with me today. Please don't hesitate to contact me if I can be of assistance to you.   Following are the goals we discussed today:   Goals Addressed               This Visit's Progress     Receive Assistance Obtaining Personal Care Services. (pt-stated)   On track     Care Coordination Interventions:  Interventions Today    Flowsheet Row Most Recent Value  Chronic Disease   Chronic disease during today's visit Chronic Obstructive Pulmonary Disease (COPD), Hypertension (HTN), Chronic Kidney Disease/End Stage Renal Disease (ESRD), Other  [Inability to Perform Activities of Daily Living Independently, Recurrent Depression, Cigarette Nicotine Dependence, Generalized Anxiety Disorder & History of Transient Ischemic Attack, Frequent Falls]  General Interventions   General Interventions Discussed/Reviewed General Interventions Discussed, General Interventions Reviewed, Communication with, Doctor Visits, [facility], Level of Care, Horticulturist, commercial (DME), Vaccines, Health Screening  [Communication with Care Team Members]  Labs Hgb A1c every 3 months, Kidney Function  [Encouraged]  Vaccines COVID-19, Flu, Pneumonia, RSV, Shingles, Tetanus/Pertussis/Diphtheria  [Encouraged]  Doctor Visits Discussed/Reviewed Doctor Visits Discussed, Doctor Visits Reviewed, Annual Wellness Visits, PCP, Specialist  [Encouraged Routine Engagement]  Health Screening Bone Density, Colonoscopy, Mammogram  [Encouraged]  Durable Medical Equipment (DME) BP Cuff, Other  [Scales, Prescription Eyeglasses]  PCP/Specialist Visits Compliance with follow-up visit  [Encouraged Routine Engagement]  Communication with PCP/Specialists, RN, Pharmacists, Social Work  [employer] Routine Engagement]  Level of Care Adult Daycare, Air traffic controller, Assisted Living, Skilled Nursing Facility  [Confirmed Disinterest in Enrollment in Adult Day Care Program or Applying for  Medicaid or Personal Care Services]  Applications Medicaid, Personal Care Services  [Confirmed Disinterest in Applying for Medicaid or Personal Care Services]  Exercise Interventions   Exercise Discussed/Reviewed Exercise Discussed, Assistive device use and maintanence, Exercise Reviewed, Physical Activity, Weight Managment  [Encouraged]  Physical Activity Discussed/Reviewed Physical Activity Discussed, PREP, Gym, Physical Activity Reviewed, Types of exercise, Home Exercise Program (HEP)  [Encouraged]  Weight Management Weight maintenance  [Encouraged]  Education Interventions   Education Provided Provided Education  [Encouraged Review]  Provided Engineer, petroleum On Nutrition, Air traffic controller, Development worker, community, Exercise, Medication, When to see the doctor, [facility], Mental Health/Coping with Illness  [Encouraged Consideration of Implementation Techniques Discussed.]  Ship broker, Personal Care Services  [Confirmed Disinterest in Applying for Medicaid or Personal Care Services]  Mental Health Interventions   Mental Health Discussed/Reviewed Mental Health Discussed, Grief and Loss, Substance Abuse, Mental Health Reviewed, Suicide, Coping Strategies, Crisis, Anxiety, Depression  [Assessed Mental Health Status]  Nutrition Interventions   Nutrition Discussed/Reviewed Nutrition Discussed, Fluid intake, Decreasing salt, Portion sizes, Supplemental nutrition, Nutrition Reviewed, Decreasing sugar intake, Carbohydrate meal planning, Increasing proteins, Decreasing fats, Adding fruits and vegetables  [Encouraged]  Pharmacy Interventions   Pharmacy Dicussed/Reviewed Pharmacy Topics Discussed, Pharmacy Topics Reviewed, Medications and their functions, Medication Adherence, Affording Medications  [Confirmed Medication Compliance]  Medication Adherence --  [Confirmed Ability to [employer] Prescription Medications]  Safety Interventions   Safety Discussed/Reviewed Safety Discussed, Safety Reviewed,  Fall Risk, Home Safety  [Encouraged Consideration of Home Safety Evaluation]  Home Safety Assistive Devices, Need for home safety assessment, Refer for home visit, Contact provider for referral to PT/OT, Refer for community resources, Contact home health agency  [Encouraged Consideration of Home Health Physical & Occupational Therapy Services & Purchasing a Rollator Walker.]  Advanced Directive Interventions   Advanced Directives Discussed/Reviewed Advanced Directives Discussed, Advanced Directives Reviewed  [  Encouraged Initiation of Advanced Directives, Offering to [employer] & Assist with Completion.]      Active Listening & Reflection Utilized.  Verbalization of Feelings Encouraged.  Emotional Support Provided. Cognitive Behavioral Therapy Indicated. Acceptance & Commitment Therapy Initiated. Client-Centered Therapy Performed. Encouraged Routine Engagement with Representative from Meals on Wheels, through Aging, Disability & Transit Services of [place] [phone]), in An Effort to Check Status of Application for Prepared Meal Delivery Services in The Home. Encouraged Routine Engagement with Representative from [employer] [facility] [phone]), to Check Status of Initial Home Assessment to Determine Eligibility for Personal Care Services, Initiated on 04/02/2023. Encouraged Routine Engagement with [name], Licensed Clinical Social Worker with [facility] [phone]), if You Have Questions, Need Assistance, or If Additional Social Work Needs Are Identified Between Now & Our Next Follow-Up Outreach Call, Scheduled on 06/18/2023 at 3:30 PM.       Our next appointment is by telephone on 06/18/2023 at 3:30 pm.  Please call the care guide team at [phone] if you need to cancel or reschedule your appointment.   If you are experiencing a Mental Health or Behavioral Health Crisis or need someone to talk to, please call the Suicide and  Crisis Lifeline: 988 call the [place] National Suicide Prevention Lifeline: 418-740-8095 or TTY: (385)014-5337 TTY 8322667638) to talk to a trained counselor call 1-800-273-TALK (toll free, 24 hour hotline) go to [facility] [address] [place] [phone]) call the [facility] Crisis Line: [phone] call 911  Patient verbalizes understanding of instructions and care plan provided today and agrees to view in MyChart. Active MyChart status and patient understanding of how to access instructions and care plan via MyChart confirmed with patient.     Telephone follow up appointment with care management team member scheduled for:  06/18/2023 at 3:30 pm.  [name], BSW, MSW, LCSW  Embedded Practice Social Work Case Manager  [facility], Population Health Direct Dial: [phone]  Fax: [phone] Email: [email or web address] Website: [email or web address]

## 2023-06-06 ENCOUNTER — Telehealth: Payer: Self-pay | Admitting: *Deleted

## 2023-06-06 ENCOUNTER — Other Ambulatory Visit: Payer: Self-pay | Admitting: *Deleted

## 2023-06-06 NOTE — Patient Outreach (Signed)
  Care Management   Follow Up Note   06/06/2023 Name: [name] MRN: [id number] DOB: 1940-06-22   Referred by: Zollie Lowers, MD Reason for referral : Care Management (RNCM: ATTEMPT #1 Follow Up For Chronic Disease Management & Care Coordination Needs)   An unsuccessful telephone outreach was attempted today. The patient was referred to the case management team for assistance with care management and care coordination.   Follow Up Plan: The care management team will reach out to the patient again over the next 30 days.   Rosina Forte, BSN RN RN Care Manager  [place]  Ambulatory Care Management  Direct Number: [phone]

## 2023-06-11 ENCOUNTER — Encounter (HOSPITAL_BASED_OUTPATIENT_CLINIC_OR_DEPARTMENT_OTHER): Payer: Self-pay | Admitting: Family

## 2023-06-11 ENCOUNTER — Ambulatory Visit (INDEPENDENT_AMBULATORY_CARE_PROVIDER_SITE_OTHER): Payer: No Typology Code available for payment source | Admitting: Family

## 2023-06-11 VITALS — BP 130/84 | HR 60 | Ht 61.9 in | Wt 134.2 lb

## 2023-06-11 DIAGNOSIS — I10 Essential (primary) hypertension: Secondary | ICD-10-CM

## 2023-06-11 DIAGNOSIS — R252 Cramp and spasm: Secondary | ICD-10-CM | POA: Diagnosis not present

## 2023-06-11 DIAGNOSIS — I25118 Atherosclerotic heart disease of native coronary artery with other forms of angina pectoris: Secondary | ICD-10-CM | POA: Diagnosis not present

## 2023-06-11 DIAGNOSIS — E785 Hyperlipidemia, unspecified: Secondary | ICD-10-CM

## 2023-06-11 DIAGNOSIS — I739 Peripheral vascular disease, unspecified: Secondary | ICD-10-CM

## 2023-06-11 DIAGNOSIS — E782 Mixed hyperlipidemia: Secondary | ICD-10-CM | POA: Diagnosis not present

## 2023-06-11 MED ORDER — CLOPIDOGREL BISULFATE 75 MG PO TABS: 75.0000 mg | ORAL_TABLET | Freq: Every day | ORAL | 3 refills | Status: DC

## 2023-06-11 MED ORDER — ATORVASTATIN CALCIUM 80 MG PO TABS
80.0000 mg | ORAL_TABLET | Freq: Every day | ORAL | 3 refills | Status: DC
Start: 2023-06-11 — End: 2024-04-16

## 2023-06-11 MED ORDER — CLONIDINE 0.1 MG/24HR TD PTWK: 0.1000 mg | MEDICATED_PATCH | TRANSDERMAL | 12 refills | Status: DC

## 2023-06-11 MED ORDER — AMLODIPINE-OLMESARTAN 10-40 MG PO TABS
1.0000 | ORAL_TABLET | Freq: Every day | ORAL | 1 refills | Status: DC
Start: 2023-06-11 — End: 2023-07-22

## 2023-06-11 MED ORDER — ISOSORBIDE MONONITRATE ER 60 MG PO TB24
60.0000 mg | ORAL_TABLET | Freq: Every day | ORAL | 1 refills | Status: DC
Start: 2023-06-11 — End: 2023-10-10

## 2023-06-11 MED ORDER — FUROSEMIDE 20 MG PO TABS
20.0000 mg | ORAL_TABLET | Freq: Every day | ORAL | 1 refills | Status: DC
Start: 2023-06-11 — End: 2023-12-09

## 2023-06-11 NOTE — Progress Notes (Signed)
 Cardiology Office Note:  .   Date:  06/11/2023  ID:  [name], DOB [birth year], MRN [id number] PCP: Tabitha Lowers, MD  [facility] Providers Cardiologist:  Tabitha Scarce, MD    History of Present Illness: Tabitha   [name] is a 83 y.o. female  with a hx of CAD, hyperlipidemia, penetrating aortic ulcer, CVA, COPD, hypertension, CKD 3A, GERD.   Established with Dr. Scarce of 03/19/2022 due to atypical chest pain.  Cardiac CTA ordered and performed 03/29/2022 calcium  score 396 placing her in the 85th percentile for age/sex/race matched control.  Moderate nonobstructive disease with no significant lesion by FFR.   Admitted 10/29 - 03/27/2022 after presenting with COPD exacerbation as well as AKI.  Her ARB and HCTZ were held.  She was treated with antibiotics, bronchodilators/nebulizer.  Subsequently established with pulmonology.   When seen 04/17/2022 hydralazine  25 mg twice daily initiated and later increased.  At visit 07/04/2022 hydrochlorothiazide  12.5 mg was added to her valsartan /HCTZ 160/12.5 mg QD.   Admitted 4/21 - 09/20/2022 with chest discomfort, hypertensive urgency.  Blood pressure initially up to 237.  Started on nicardipine  drip.  CTA chest/abdomen/pelvis showed partially calcified atherosclerotic plaque with focal stenosis of the left renal artery with moderate atrophy of the left kidney.  Evaluated by [name] with good celiac trunk and SMA flow recommended to follow-up regarding renal stenosis in outpatient setting.  Valsartan  and hydrochlorothiazide  were discontinued due to renal function.  She was discharged on  amlodipine  10 mg daily, hydralazine  50 mg TID, isosorbide  dinitrate 20 mg TID,  bisoprolol  5 mg daily.   Seen 10/15/22,  Zetia  was added as LDL not at goal.   ED visit 12/07/22 with elevated BP which improved to 130 systolic without intervention.  CXR showed interstitial edema and she was given dose of Lasix . New age indeterminate infarct in left  banglia CVA.  No focal neurological deficits noted.  PCP has since ordered MRI brain which has not yet been performed.  She was referred to home health.  Last seen 12/31/2022 with BP 130/68.  Due to palpitations and new finding of age-indeterminate infarct of left basal ganglia by CT ZIO was placed but did not reveal atrial fibrillation.  LDL remained above goal of less than 70 she was referred to Pharm.D. lipid clinic and started on Repatha .  Last seen 05/06/2023 with elevated BP, inconsistency in her antihypertensive regimen.  Isordil  transitioned to Imdur .She was not taking Zetia , Repatha , nor Atorvastatin  but agreeable to resume Atorvastatin . PCP had reduced Furosemide  and Metoprolol  dose with improvement in energy level, reduced doses were continued. Amlodipine  and Olmesartan  transition amlodipine -olmesartan .  Clonidine  0.1 mg patch subsequently added for BP control.  She presents today for follow up in dependently. Reports blood pressure slightly labile at home but overall controlled. Notes occasional lightheadedness which she attributeed to the bright light when she was looking at the snow and turned her head quickly. Reports no shortness of breath nor dyspnea on exertion. Reports no chest pain, pressure, or tightness. No edema, orthopnea, PND. Reports no palpitations.  Has not resumed Atorvastatin , we again reviewed its importance.   ROS: Please see the history of present illness.    All other systems reviewed and are negative.   Studies Reviewed: .        Cardiac Studies & Procedures      ECHOCARDIOGRAM  ECHOCARDIOGRAM COMPLETE 09/17/2022  Narrative ECHOCARDIOGRAM REPORT    Patient Name:   [name] [facility] Date of Exam:  09/17/2022 Medical Rec #:  [id number]        Height:       61.0 in Accession #:    [id number]       Weight:       132.7 lb Date of Birth:  [birth year]         BSA:          1.587 m Patient Age:    81 years         BP:           154/55 mmHg Patient Gender: F                 HR:           78 bpm. Exam Location:  [name]  Procedure: 2D Echo, Cardiac Doppler and Color Doppler  Indications:    Chest Pain R07.9  History:        Patient has prior history of Echocardiogram examinations, most recent 02/12/2012. COPD, TIA and Stroke; Risk Factors:Hypertension and Dyslipidemia. Hx of Tobacco abuse.  Sonographer:    [name] RCS Referring Phys: [phone] [name]  IMPRESSIONS   1. Left ventricular ejection fraction, by estimation, is >75%. The left ventricle has hyperdynamic function. The left ventricle has no regional wall motion abnormalities. Left ventricular diastolic parameters are consistent with Grade I diastolic dysfunction (impaired relaxation). 2. Right ventricular systolic function is normal. The right ventricular size is normal. Tricuspid regurgitation signal is inadequate for assessing PA pressure. 3. The mitral valve is normal in structure. No evidence of mitral valve regurgitation. No evidence of mitral stenosis. 4. The aortic valve was not well visualized. Aortic valve regurgitation is not visualized. No aortic stenosis is present. 5. The inferior vena cava is normal in size with greater than 50% respiratory variability, suggesting right atrial pressure of 3 mmHg.  Comparison(s): No prior Echocardiogram.  FINDINGS Left Ventricle: Left ventricular ejection fraction, by estimation, is >75%. The left ventricle has hyperdynamic function. The left ventricle has no regional wall motion abnormalities. The left ventricular internal cavity size was normal in size. There is no left ventricular hypertrophy. Left ventricular diastolic parameters are consistent with Grade I diastolic dysfunction (impaired relaxation).  Right Ventricle: The right ventricular size is normal. No increase in right ventricular wall thickness. Right ventricular systolic function is normal. Tricuspid regurgitation signal is inadequate for assessing PA pressure.  Left Atrium: Left  atrial size was normal in size.  Right Atrium: Right atrial size was normal in size.  Pericardium: There is no evidence of pericardial effusion.  Mitral Valve: The mitral valve is normal in structure. There is mild thickening of the mitral valve leaflet(s). There is mild calcification of the mitral valve leaflet(s). No evidence of mitral valve regurgitation. No evidence of mitral valve stenosis.  Tricuspid Valve: The tricuspid valve is normal in structure. Tricuspid valve regurgitation is not demonstrated. No evidence of tricuspid stenosis.  Aortic Valve: The aortic valve was not well visualized. Aortic valve regurgitation is not visualized. No aortic stenosis is present.  Pulmonic Valve: The pulmonic valve was not well visualized. Pulmonic valve regurgitation is not visualized. No evidence of pulmonic stenosis.  Aorta: The aortic root is normal in size and structure.  Venous: The inferior vena cava is normal in size with greater than 50% respiratory variability, suggesting right atrial pressure of 3 mmHg.  IAS/Shunts: No atrial level shunt detected by color flow Doppler.   LEFT VENTRICLE PLAX 2D LVIDd:  4.10 cm   Diastology LVIDs:         2.00 cm   LV e' medial:    6.74 cm/s LV PW:         1.00 cm   LV E/e' medial:  8.7 LV IVS:        1.00 cm   LV e' lateral:   7.40 cm/s LVOT diam:     1.80 cm   LV E/e' lateral: 7.9 LV SV:         67 LV SV Index:   43 LVOT Area:     2.54 cm   RIGHT VENTRICLE RV S prime:     12.80 cm/s TAPSE (M-mode): 2.5 cm  LEFT ATRIUM             Index        RIGHT ATRIUM           Index LA diam:        3.10 cm 1.95 cm/m   RA Area:     15.50 cm LA Vol (A2C):   50.7 ml 31.95 ml/m  RA Volume:   42.00 ml  26.47 ml/m LA Vol (A4C):   48.6 ml 30.63 ml/m LA Biplane Vol: 50.0 ml 31.51 ml/m AORTIC VALVE LVOT Vmax:   130.00 cm/s LVOT Vmean:  93.200 cm/s LVOT VTI:    0.265 m  AORTA Ao Root diam: 2.80 cm  MITRAL VALVE MV Area (PHT): 2.95 cm     SHUNTS MV Decel Time: 257 msec    Systemic VTI:  0.26 m MV E velocity: 58.70 cm/s  Systemic Diam: 1.80 cm MV A velocity: 89.40 cm/s MV E/A ratio:  0.66  Vishnu Priya Mallipeddi Electronically signed by Diannah Late Mallipeddi Signature Date/Time: 09/17/2022/2:50:27 PM    Final   MONITORS  LONG TERM MONITOR (3-14 DAYS) 01/14/2023  Narrative 9 Day Zio Monitor  Quality: Fair.  Baseline artifact. Predominant rhythm: sinus rhythm Average heart rate: 80 bpm Max heart rate: 121 bpm Min heart rate: 48 bpm Pauses >2.5 seconds: none  Rare PACs and PVCs (<1%) 5 episodes of SVT   Tiffany C. Raford, MD, [facility] 02/17/2023 9:39 AM  CT SCANS  CT CORONARY MORPH W/CTA COR W/SCORE 03/30/2022  Addendum 03/30/2022  1:27 PM ADDENDUM REPORT: 03/30/2022 13:25  EXAM: OVER-READ INTERPRETATION  CT CHEST  The following report is an over-read performed by radiologist Dr. Mabel Converse of [facility] on 03/30/2022. This over-read does not include interpretation of cardiac or coronary anatomy or pathology. The coronary CTA interpretation by the cardiologist is attached.  COMPARISON:  05/15/2021  FINDINGS: Normal heart size. No pericardial effusion. Image thoracic aorta is nonaneurysmal. Central pulmonary vasculature within normal limits.  No adenopathy within the included chest. Imaged lung fields are clear. Mild centrilobular emphysema. Mild bronchial wall thickening. No acute bony or chest wall abnormality.  IMPRESSION: Mild centrilobular emphysema and mild bronchial wall thickening.  Emphysema (ICD10-J43.9).   Electronically Signed By: Mabel Converse D.O. On: 03/30/2022 13:25  Narrative CLINICAL DATA:  Chest pain  EXAM: Cardiac/Coronary CTA  TECHNIQUE: A non-contrast, gated CT scan was obtained with axial slices of 3 mm through the heart for calcium  scoring. Calcium  scoring was performed using the Agatston method. A 120 kV prospective, gated,  contrast cardiac scan was obtained. Gantry rotation speed was 250 msecs and collimation was 0.6 mm. Two sublingual nitroglycerin  tablets (0.8 mg) were given. The 3D data set was reconstructed in 5% intervals of the 35-75% of the R-R cycle.  Diastolic phases were analyzed on a dedicated workstation using MPR, MIP, and VRT modes. The patient received 95 cc of contrast.  FINDINGS: Image quality: Fair due to significant slab artifact.  Noise artifact is: Limited.  Coronary Arteries:  Normal coronary origin.  Right dominance.  Left main: The left main is a large caliber vessel with a normal take off from the left coronary cusp that bifurcates to form a left anterior descending artery and a left circumflex artery. There is no plaque or stenosis.  Left anterior descending artery: The LAD gives off 1 patent diagonal branch. There is mild calcified plaque in the proximal, mid and distal LAD with associated stenosis of 25-49%. There is moderate mixed plaque in the mid D1 with associated stenosis of 50-69%.  Left circumflex artery: The LCX is non-dominant and gives rise to 3 OM branches. There is mild calcified plaque in the proximal LCx with associated stenosis of 25-49%.  Right coronary artery: The RCA is dominant with normal take off from the right coronary cusp. The vessel is diffusely diseased in the proximal and mid portion with mixed and calcified plaque. There is mild to moderate calcified plaque in the proximal RCA with associated stenosis of 25-49% but could be >50%. There is mild calcified plaque in the distal RCA with associated stenosis of 25-49%. the RCA terminates as a PDA and right posterolateral branch without evidence of plaque or stenosis.  Right Atrium: Right atrial size is within normal limits.  Right Ventricle: The right ventricular cavity is within normal limits.  Left Atrium: Left atrial size is normal in size with no left atrial appendage filling  defect.  Left Ventricle: The ventricular cavity size is within normal limits. There are no stigmata of prior infarction. There is no abnormal filling defect.  Pulmonary arteries: Normal in size without proximal filling defect.  Pulmonary veins: Normal pulmonary venous drainage.  Pericardium: Normal thickness with no significant effusion or calcium  present.  Cardiac valves: The aortic valve is trileaflet without significant calcification. The mitral valve is normal structure without significant calcification.  Aorta: Normal caliber with scattered calcifications.  Extra-cardiac findings: See attached radiology report for non-cardiac structures.  IMPRESSION: 1. Coronary calcium  score of 396. This was 85th percentile for age-, sex, and race-matched controls.  2.  Normal coronary origin with right dominance.  3.  Moderate atherosclerosis.  CAD RADS 3.  4. Consider symptom-guided anti-ischemic pharmacotherapy and risk factor modification per guideline-directed medical therapy.  5.  Study has been submitted for FFR analysis.  RECOMMENDATIONS: 1. CAD-RADS 0: No evidence of CAD (0%). Consider non-atherosclerotic causes of chest pain.  2. CAD-RADS 1: Minimal non-obstructive CAD (0-24%). Consider non-atherosclerotic causes of chest pain. Consider preventive therapy and risk factor modification.  3. CAD-RADS 2: Mild non-obstructive CAD (25-49%). Consider non-atherosclerotic causes of chest pain. Consider preventive therapy and risk factor modification.  4. CAD-RADS 3: Moderate stenosis. Consider symptom-guided anti-ischemic pharmacotherapy as well as risk factor modification per guideline directed care. Additional analysis with CT FFR will be submitted.  5. CAD-RADS 4: Severe stenosis. (70-99% or > 50% left main). Cardiac catheterization or CT FFR is recommended. Consider symptom-guided anti-ischemic pharmacotherapy as well as risk factor modification per guideline directed  care. Invasive coronary angiography recommended with revascularization per published guideline statements.  6. CAD-RADS 5: Total coronary occlusion (100%). Consider cardiac catheterization or viability assessment. Consider symptom-guided anti-ischemic pharmacotherapy as well as risk factor modification per guideline directed care.  7. CAD-RADS N: Non-diagnostic study. Obstructive CAD can't be excluded.  Alternative evaluation is recommended.  Wilbert Bihari, MD  Electronically Signed: By: Wilbert Bihari M.D. On: 03/30/2022 11:45            Risk Assessment/Calculations:             Physical Exam:   VS:  BP 130/84 (BP Location: Right Arm, Patient Position: Sitting, Cuff Size: Normal)   Pulse 60   Ht 5' 1.9 (1.572 m)   Wt 134 lb 3.2 oz (60.9 kg)   SpO2 98%   BMI 24.62 kg/m    Wt Readings from Last 3 Encounters:  06/11/23 134 lb 3.2 oz (60.9 kg)  05/15/23 132 lb 9.6 oz (60.1 kg)  05/06/23 138 lb 14.4 oz (63 kg)    Vitals:   06/11/23 0826  BP: 130/84  Pulse: 60  Height: 5' 1.9 (1.572 m)  Weight: 134 lb 3.2 oz (60.9 kg)  SpO2: 98%  BMI (Calculated): 24.63    GEN: Well nourished, well developed in no acute distress NECK: No JVD; No carotid bruits CARDIAC: RRR, no murmurs, rubs, gallops RESPIRATORY:  Clear to auscultation without rales, wheezing or rhonchi  ABDOMEN: Soft, non-tender, non-distended EXTREMITIES:  No edema; No deformity   ASSESSMENT AND PLAN: .    CAD/hyperlipidemia-cardiac CTA 03/29/2022 with coronary calcium  score of 396 placing in the 85th percentile with moderate nonobstructive coronary artery disease by FFR. Stable with no anginal symptoms. No indication for ischemic evaluation.   Continue GDMT Imdur  60mg , Plavix , atorvastatin  80mg  daily. Heart healthy diet and regular cardiovascular exercise encouraged.   Lipid management: Not  taking atorvastatin , Zetia , nor Repatha .  We discussed that the reason for lipid therapy has been escalated to include  multiple agents was her numbers were not at goal.  She is agreeable to resume atorvastatin  80 mg daily. CMP, lipid panel today for baseline.    HTN - BP reasonably well controlled continue present regimen: Amlodipine -Olmesartan  10-40mg  daily, Imdur  60mg  daily, Clonidine  0.1mg  patch weekly. Toprol  25mg  daily and Lasix  20mg  daily. Discussed to monitor BP at home at least 2 hours after medications and sitting for 5-10 minutes.   PAD / RAS - Follows with Dr. Gretta. No new claudication symptoms.   Leg cramps -predominantly at night.  Update CMP, magnesium .   Hx of CVA - Continue plavix , atorvastatin . Prior monitor with no atrial fib.    CKD- Careful titration of diuretic and antihypertensive.     GERD- Continue to follow with PCP.    COPD/tobacco use- Follows with pulmonology. No signs of acute exacerbation. Congratulated on continued cessation.        Dispo: follow up in 6 mos  Signed, Reche GORMAN Finder, NP

## 2023-06-11 NOTE — Patient Instructions (Addendum)
 Medication Instructions:   Resume Atorvastatin  once per day.  Your blood pressure looked great today! *If you need a refill on your cardiac medications before your next appointment, please call your pharmacy*   Lab Work: Your physician recommends that you return for lab work today: lipid panel, magnesium , CMP  If you have labs (blood work) drawn today and your tests are completely normal, you will receive your results only by: MyChart Message (if you have MyChart) OR A paper copy in the mail If you have any lab test that is abnormal or we need to change your treatment, we will call you to review the results.  Follow-Up: At [facility], you and your health needs are our priority.  As part of our continuing mission to provide you with exceptional heart care, we have created designated Provider Care Teams.  These Care Teams include your primary Cardiologist (physician) and Advanced Practice Providers (APPs -  Physician Assistants and Nurse Practitioners) who all work together to provide you with the care you need, when you need it.  We recommend signing up for the patient portal called MyChart.  Sign up information is provided on this After Visit Summary.  MyChart is used to connect with patients for Virtual Visits (Telemedicine).  Patients are able to view lab/test results, encounter notes, upcoming appointments, etc.  Non-urgent messages can be sent to your provider as well.   To learn more about what you can do with MyChart, go to [email or web address].    Your next appointment:   6 month(s)  Provider:   Annabella Scarce, MD or Reche Finder, NP    Other Instructions

## 2023-06-12 ENCOUNTER — Telehealth (HOSPITAL_BASED_OUTPATIENT_CLINIC_OR_DEPARTMENT_OTHER): Payer: Self-pay

## 2023-06-12 LAB — COMPREHENSIVE METABOLIC PANEL
ALT: 7 [IU]/L (ref 0–32)
AST: 20 [IU]/L (ref 0–40)
Albumin: 4.4 g/dL (ref 3.8–4.8)
Alkaline Phosphatase: 67 [IU]/L (ref 44–121)
BUN/Creatinine Ratio: 13 (ref 12–28)
BUN: 17 mg/dL (ref 8–27)
Bilirubin Total: 0.3 mg/dL (ref 0.0–1.2)
CO2: 23 mmol/L (ref 20–29)
Calcium: 9.2 mg/dL (ref 8.7–10.3)
Chloride: 107 mmol/L — ABNORMAL HIGH (ref 96–106)
Creatinine, Ser: 1.3 mg/dL — ABNORMAL HIGH (ref 0.57–1.00)
Globulin, Total: 2.4 g/dL (ref 1.5–4.5)
Glucose: 108 mg/dL — ABNORMAL HIGH (ref 70–99)
Potassium: 4.1 mmol/L (ref 3.5–5.2)
Sodium: 145 mmol/L — ABNORMAL HIGH (ref 134–144)
Total Protein: 6.8 g/dL (ref 6.0–8.5)
eGFR: 42 mL/min/{1.73_m2} — ABNORMAL LOW (ref >59)

## 2023-06-12 LAB — LIPID PANEL
Chol/HDL Ratio: 2.5 {ratio} (ref 0.0–4.4)
Cholesterol, Total: 216 mg/dL — ABNORMAL HIGH (ref 100–199)
HDL: 86 mg/dL (ref >39)
LDL Chol Calc (NIH): 118 mg/dL — ABNORMAL HIGH (ref 0–99)
Triglycerides: 67 mg/dL (ref 0–149)
VLDL Cholesterol Cal: 12 mg/dL (ref 5–40)

## 2023-06-12 LAB — MAGNESIUM: Magnesium: 2.3 mg/dL (ref 1.6–2.3)

## 2023-06-12 NOTE — Telephone Encounter (Signed)
-----   Message from Alver Sorrow sent at 06/12/2023  9:12 AM EST ----- Stable kidney function. Normal electrolytes, liver. Sodium mildly elevated, likely indicates very mild dehydration. Be sure to hydrate well. Cholesterol numbers not at goal, resume Atorvastatin as discussed in clinic to help lower cholesterol and reduce risk of future heart attack or stroke.

## 2023-06-12 NOTE — Telephone Encounter (Signed)
 Unable to leave voice message. Mailbox is full.   ----- Message from Clearnce Curia sent at 06/12/2023  9:12 AM EST ----- Stable kidney function. Normal electrolytes, liver. Sodium mildly elevated, likely indicates very mild dehydration. Be sure to hydrate well. Cholesterol numbers not at goal, resume Atorvastatin  as discussed in clinic to help lower cholesterol and reduce risk of future heart attack or stroke.

## 2023-06-17 ENCOUNTER — Telehealth (HOSPITAL_BASED_OUTPATIENT_CLINIC_OR_DEPARTMENT_OTHER): Payer: Self-pay

## 2023-06-17 NOTE — Telephone Encounter (Signed)
-----   Message from Alver Sorrow sent at 06/12/2023  9:12 AM EST ----- Stable kidney function. Normal electrolytes, liver. Sodium mildly elevated, likely indicates very mild dehydration. Be sure to hydrate well. Cholesterol numbers not at goal, resume Atorvastatin as discussed in clinic to help lower cholesterol and reduce risk of future heart attack or stroke.

## 2023-06-17 NOTE — Telephone Encounter (Signed)
Called and spoke to patient; discussed results and NP's recommendation. Pt verbalized understanding.  ----- Message from [name] sent at 06/12/2023  9:12 AM EST ----- Stable kidney function. Normal electrolytes, liver. Sodium mildly elevated, likely indicates very mild dehydration. Be sure to hydrate well. Cholesterol numbers not at goal, resume Atorvastatin as discussed in clinic to help lower cholesterol and reduce risk of future heart attack or stroke.

## 2023-06-18 ENCOUNTER — Ambulatory Visit: Payer: No Typology Code available for payment source | Admitting: Family Medicine

## 2023-06-18 ENCOUNTER — Encounter: Payer: Self-pay | Admitting: Family Medicine

## 2023-06-18 ENCOUNTER — Ambulatory Visit: Payer: Self-pay | Admitting: *Deleted

## 2023-06-18 VITALS — BP 204/86 | Temp 97.4°F | Ht 62.0 in | Wt 135.0 lb

## 2023-06-18 DIAGNOSIS — I10 Essential (primary) hypertension: Secondary | ICD-10-CM

## 2023-06-18 DIAGNOSIS — G2581 Restless legs syndrome: Secondary | ICD-10-CM | POA: Diagnosis not present

## 2023-06-18 DIAGNOSIS — R252 Cramp and spasm: Secondary | ICD-10-CM

## 2023-06-18 MED ORDER — ROPINIROLE HCL 1 MG PO TABS: 1.0000 mg | ORAL_TABLET | Freq: Every day | ORAL | 2 refills | Status: DC

## 2023-06-18 MED ORDER — CLONIDINE 0.2 MG/24HR TD PTWK: 0.2000 mg | MEDICATED_PATCH | TRANSDERMAL | 12 refills | Status: DC

## 2023-06-18 MED ORDER — MAGNESIUM 250 MG PO TABS: 250.0000 mg | ORAL_TABLET | Freq: Every day | ORAL | 99 refills | Status: DC

## 2023-06-18 NOTE — Progress Notes (Signed)
Subjective:  Patient ID: [name], female    DOB: 1940-09-05  Age: 83 y.o. MRN: [id number]  CC: Medical Management of Chronic Issues and cramps (Muscle cramps. Very painful. Not on magnesium but hx of it being low. )   HPI [name] presents for chronic leg cramps keeping her awake at night. Feels like the legs have a mind of their own. Ongoing for a long time. Worse recently. Magnesium considered, but the level was normal.  BP has been better on multiple checks since starting the catapres. She has not experienced any side effects.       06/18/2023    1:02 PM 05/15/2023   10:16 AM 05/15/2023    9:35 AM  Depression screen PHQ 2/9  Decreased Interest 1 0 0  Down, Depressed, Hopeless 1 2 0  PHQ - 2 Score 2 2 0  Altered sleeping 1 2   Tired, decreased energy 1 2   Change in appetite 1 2   Feeling bad or failure about yourself  1 0   Trouble concentrating 1 2   Moving slowly or fidgety/restless 1 0   Suicidal thoughts 1 0   PHQ-9 Score 9 10   Difficult doing work/chores Somewhat difficult Not difficult at all     History [name] has a past medical history of Arthritis, Atypical chest pain (03/19/2022), CHF (congestive heart failure) (HCC), GERD (gastroesophageal reflux disease), Headache(784.0), Hepatitis, History of gout, Hypertension, Hypertensive encephalopathy, 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   She has a past surgical history that includes Abdominal hysterectomy; Anterior cervical corpectomy (03/07/2012); Tonsillectomy; Multiple tooth extractions; Cataract extraction w/PHACO (Left, 04/12/2017); and Cataract extraction w/PHACO (Right, 05/03/2017).   Her family history includes Asthma in her daughter; Bipolar disorder in her daughter, daughter, daughter, daughter, and son; Drug abuse in her daughter; Heart disease in her daughter and daughter; Hypertension in her daughter. She was  adopted.She reports that she quit smoking about 14 months ago. Her smoking use included cigarettes. She started smoking about 63 years ago. She has a 30.9 pack-year smoking history. She has been exposed to tobacco smoke. She has never used smokeless tobacco. She reports that she does not currently use alcohol. She reports that she does not use drugs.    ROS Review of Systems  Constitutional: Negative.   HENT: Negative.    Eyes:  Negative for visual disturbance.  Respiratory:  Negative for shortness of breath.   Cardiovascular:  Negative for chest pain.  Gastrointestinal:  Negative for abdominal pain.  Musculoskeletal:  Negative for arthralgias.    Objective:  BP (!) 204/86   Temp (!) 97.4 F (36.3 C)   Ht 5\' 2"  (1.575 m)   Wt 135 lb (61.2 kg)   SpO2 97%   BMI 24.69 kg/m   BP Readings from Last 3 Encounters:  06/18/23 (!) 204/86  06/11/23 130/84  05/15/23 (!) 188/80    Wt Readings from Last 3 Encounters:  06/18/23 135 lb (61.2 kg)  06/11/23 134 lb 3.2 oz (60.9 kg)  05/15/23 132 lb 9.6 oz (60.1 kg)     Physical Exam Constitutional:      General: She is not in acute distress.    Appearance: She is well-developed.  Cardiovascular:     Rate and Rhythm: Normal rate and regular rhythm.  Pulmonary:     Breath sounds: Normal breath sounds.  Musculoskeletal:        General: Normal range of motion.  Skin:    General: Skin is warm and dry.  Neurological:     Mental Status: She is alert and oriented to person, place, and time.       Assessment & Plan:   [name] was seen today for medical management of chronic issues and cramps.  Diagnoses and all orders for this visit:  Essential hypertension with goal blood pressure less than 130/80  Leg cramps -     Phosphorus -     CBC with Differential/Platelet  Restless legs syndrome  Other orders -     Magnesium 250 MG TABS; Take 1 tablet (250 mg total) by mouth daily. -     cloNIDine (CATAPRES - DOSED IN MG/24 HR)  0.2 mg/24hr patch; Place 1 patch (0.2 mg total) onto the skin once a week. -     rOPINIRole (REQUIP) 1 MG tablet; Take 1 tablet (1 mg total) by mouth at bedtime. For leg cramps       I have discontinued [name]'s tiZANidine and cloNIDine. I am also having her start on Magnesium, cloNIDine, and rOPINIRole. Additionally, I am having her maintain her cyanocobalamin, budesonide-formoterol, albuterol, acetaminophen, Vitamin D (Ergocalciferol), potassium chloride SA, budesonide, arformoterol, ipratropium-albuterol, alendronate, SUMAtriptan, meloxicam, isosorbide mononitrate, furosemide, clopidogrel, amLODipine-olmesartan, and atorvastatin.  Allergies as of 06/18/2023       Reactions   Penicillins Anaphylaxis, Swelling, Rash, Other (See Comments)   Has patient had a PCN reaction causing immediate rash, facial/tongue/throat swelling, SOB or lightheadedness with hypotension: yes Has patient had a PCN reaction causing severe rash involving mucus membranes or skin necrosis: no Has patient had a PCN reaction that required hospitalization: yes Has patient had a PCN reaction occurring within the last 10 years: no If all of the above answers are "NO", then may proceed with Cephalosporin use.        Medication List        Accurate as of June 18, 2023  8:36 PM. If you have any questions, ask your nurse or doctor.          STOP taking these medications    cloNIDine 0.1 mg/24hr patch Commonly known as: CATAPRES - Dosed in mg/24 hr Replaced by: cloNIDine 0.2 mg/24hr patch Stopped by: Inara Dike   tiZANidine 4 MG tablet Commonly known as: ZANAFLEX Stopped by: Zelena Bushong       TAKE these medications    acetaminophen 500 MG tablet Commonly known as: TYLENOL Take 2 tablets (1,000 mg total) by mouth 3 (three) times daily.   albuterol 108 (90 Base) MCG/ACT inhaler Commonly known as: VENTOLIN HFA Inhale 2 puffs into the lungs every 6 (six) hours as needed for wheezing or  shortness of breath.   alendronate 70 MG tablet Commonly known as: FOSAMAX Take 1 tablet (70 mg total) by mouth every 7 (seven) days. Take with a full glass of water on an empty stomach.   amLODipine-olmesartan 10-40 MG tablet Commonly known as: AZOR Take 1 tablet by mouth daily. For blood pressure.   arformoterol 15 MCG/2ML Nebu Commonly known as: BROVANA Take 2 mLs (15 mcg total) by nebulization 2 (two) times daily.   atorvastatin 80 MG tablet Commonly known as: LIPITOR Take 1 tablet (80 mg total) by mouth daily.   budesonide 0.5 MG/2ML nebulizer solution Commonly known as: PULMICORT Take 2 mLs (0.5 mg total) by nebulization 2 (two) times daily.   budesonide-formoterol 160-4.5 MCG/ACT inhaler Commonly known as: SYMBICORT Inhale 2 puffs into the lungs 2 (two) times daily.  cloNIDine 0.2 mg/24hr patch Commonly known as: CATAPRES - Dosed in mg/24 hr Place 1 patch (0.2 mg total) onto the skin once a week. Replaces: cloNIDine 0.1 mg/24hr patch Started by: Broadus John Larnce Schnackenberg   clopidogrel 75 MG tablet Commonly known as: PLAVIX Take 1 tablet (75 mg total) by mouth daily.   cyanocobalamin 1000 MCG tablet Take 1 tablet (1,000 mcg total) by mouth daily.   furosemide 20 MG tablet Commonly known as: LASIX Take 1 tablet (20 mg total) by mouth daily. For swelling   ipratropium-albuterol 0.5-2.5 (3) MG/3ML Soln Commonly known as: DUONEB Take 3 mLs by nebulization in the morning and at bedtime.   isosorbide mononitrate 60 MG 24 hr tablet Commonly known as: IMDUR Take 1 tablet (60 mg total) by mouth daily. For blood pressure and to prevent chest pain.   Magnesium 250 MG Tabs Take 1 tablet (250 mg total) by mouth daily. Started by: Mayeli Bornhorst   meloxicam 7.5 MG tablet Commonly known as: MOBIC Take 1 tablet (7.5 mg total) by mouth daily. For joint and muscle pain   potassium chloride SA 20 MEQ tablet Commonly known as: KLOR-CON M Take 1 tablet (20 mEq total) by mouth daily.  For potassium replacement/ supplement   rOPINIRole 1 MG tablet Commonly known as: REQUIP Take 1 tablet (1 mg total) by mouth at bedtime. For leg cramps Started by: Brittanya Winburn   SUMAtriptan 100 MG tablet Commonly known as: Imitrex Take one at onset of HA. May repeat in 2 hours if headache persists or recurs. Limit two per 24 hours   Vitamin D (Ergocalciferol) 1.25 MG (50000 UNIT) Caps capsule Commonly known as: DRISDOL Take 1 capsule (50,000 Units total) by mouth every 7 (seven) days.         Follow-up: Return in about 1 month (around 07/19/2023) for hypertension.  Mechele Claude, M.D.

## 2023-06-18 NOTE — Patient Instructions (Signed)
Visit Information  Thank you for taking time to visit with me today. Please don't hesitate to contact me if I can be of assistance to you.   Following are the goals we discussed today:   Goals Addressed               This Visit's Progress     Receive Assistance Obtaining Personal Care Services. (pt-stated)   On track     Care Coordination Interventions:  Interventions Today    Flowsheet Row Most Recent Value  Chronic Disease   Chronic disease during today's visit Chronic Obstructive Pulmonary Disease (COPD), Hypertension (HTN), Chronic Kidney Disease/End Stage Renal Disease (ESRD), Other  [Inability to Perform Activities of Daily Living Independently, Recurrent Depression, Cigarette Nicotine Dependence, Generalized Anxiety Disorder & History of Transient Ischemic Attack, Frequent Falls]  General Interventions   General Interventions Discussed/Reviewed General Interventions Discussed, General Interventions Reviewed, Communication with, Doctor Visits, [facility], Level of Care, Horticulturist, commercial (DME), Vaccines, Health Screening  [Communication with Care Team Members]  Labs Hgb A1c every 3 months, Kidney Function  [Encouraged]  Vaccines COVID-19, Flu, Pneumonia, RSV, Shingles, Tetanus/Pertussis/Diphtheria  [Encouraged]  Doctor Visits Discussed/Reviewed Doctor Visits Discussed, Doctor Visits Reviewed, Annual Wellness Visits, PCP, Specialist  [Encouraged Routine Engagement]  Health Screening Bone Density, Colonoscopy, Mammogram  [Encouraged]  Durable Medical Equipment (DME) BP Cuff, Other  [Scales, Prescription Eyeglasses]  PCP/Specialist Visits Compliance with follow-up visit  [Encouraged Routine Engagement]  Communication with PCP/Specialists, RN, Pharmacists, Social Work  [employer] Routine Engagement]  Level of Care Adult Daycare, Air traffic controller, Assisted Living, Skilled Nursing Facility  [Confirmed Disinterest in Enrollment in Adult Day Care Program or Applying for  Medicaid or Personal Care Services]  Applications Medicaid, Personal Care Services  [Confirmed Disinterest in Applying for Medicaid or Personal Care Services]  Exercise Interventions   Exercise Discussed/Reviewed Exercise Discussed, Assistive device use and maintanence, Exercise Reviewed, Physical Activity, Weight Managment  [Encouraged]  Physical Activity Discussed/Reviewed Physical Activity Discussed, PREP, Gym, Physical Activity Reviewed, Types of exercise, Home Exercise Program (HEP)  [Encouraged]  Weight Management Weight maintenance  [Encouraged]  Education Interventions   Education Provided Provided Education  [Encouraged Review]  Provided Engineer, petroleum On Nutrition, Air traffic controller, Development worker, community, Exercise, Medication, When to see the doctor, [facility], Mental Health/Coping with Illness  [Encouraged Consideration of Implementation Techniques Discussed.]  Ship broker, Personal Care Services  [Confirmed Disinterest in Applying for Medicaid or Personal Care Services]  Mental Health Interventions   Mental Health Discussed/Reviewed Mental Health Discussed, Grief and Loss, Substance Abuse, Mental Health Reviewed, Suicide, Coping Strategies, Crisis, Anxiety, Depression  [Assessed Mental Health Status]  Nutrition Interventions   Nutrition Discussed/Reviewed Nutrition Discussed, Fluid intake, Decreasing salt, Portion sizes, Supplemental nutrition, Nutrition Reviewed, Decreasing sugar intake, Carbohydrate meal planning, Increasing proteins, Decreasing fats, Adding fruits and vegetables  [Encouraged]  Pharmacy Interventions   Pharmacy Dicussed/Reviewed Pharmacy Topics Discussed, Pharmacy Topics Reviewed, Medications and their functions, Medication Adherence, Affording Medications  [Confirmed Medication Compliance]  Medication Adherence --  [Confirmed Ability to [employer] Prescription Medications]  Safety Interventions   Safety Discussed/Reviewed Safety Discussed, Safety Reviewed,  Fall Risk, Home Safety  [Encouraged Consideration of Home Safety Evaluation]  Home Safety Assistive Devices, Need for home safety assessment, Refer for home visit, Contact provider for referral to PT/OT, Refer for community resources, Contact home health agency  [Encouraged Consideration of Home Health Physical & Occupational Therapy Services & Purchasing a Rollator Walker.]  Advanced Directive Interventions   Advanced Directives Discussed/Reviewed Advanced Directives Discussed, Advanced Directives Reviewed  [  Encouraged Initiation of Advanced Directives, Offering to [employer] & Assist with Completion.]      Active Listening & Reflection Utilized.  Verbalization of Feelings Encouraged.  Emotional Support Provided. Cognitive Behavioral Therapy Indicated. Acceptance & Commitment Therapy Initiated. Client-Centered Therapy Performed. Encouraged Termination of Unhealthy, Stressful, & Anxiety-Provoking Relationship with Current In-Home Caregiver.  Encouraged Routine Engagement with [name], Licensed Clinical Social Worker with [facility] [phone]), if You Have Questions, Need Assistance, or If Additional Social Work Needs Are Identified Between Now & Our Next Follow-Up Outreach Call, Scheduled on 07/15/2023 at 9:45 AM.      Our next appointment is by telephone on 07/15/2023 at 9:45 am.  Please call the care guide team at [phone] if you need to cancel or reschedule your appointment.   If you are experiencing a Mental Health or Behavioral Health Crisis or need someone to talk to, please call the Suicide and Crisis Lifeline: 988 call the [place] National Suicide Prevention Lifeline: 517 401 4791 or TTY: 620-703-9396 TTY 4194072129) to talk to a trained counselor call 1-800-273-TALK (toll free, 24 hour hotline) go to [facility] [address] [place] [phone]) call the [facility] Crisis Line:  [phone] call 911  Patient verbalizes understanding of instructions and care plan provided today and agrees to view in MyChart. Active MyChart status and patient understanding of how to access instructions and care plan via MyChart confirmed with patient.     Telephone follow up appointment with care management team member scheduled for:  07/15/2023 at 9:45 am.  [name], BSW, MSW, LCSW [place]  [facility], [facility] Clinical Social Worker II Direct Dial: [phone]  Fax: [phone] Website: [email or web address]

## 2023-06-18 NOTE — Patient Outreach (Signed)
Care Coordination   Follow Up Visit Note   06/18/2023  Name: [name] MRN: [id number] DOB: 10-29-1940  [name] is a 83 y.o. year old female who sees Stacks, Broadus John, MD for primary care. I spoke with [name] by phone today.  What matters to the patients health and wellness today?  Receive Assistance Obtaining Personal Care Services.     Goals Addressed               This Visit's Progress     Receive Assistance Obtaining Personal Care Services. (pt-stated)   On track     Care Coordination Interventions:  Interventions Today    Flowsheet Row Most Recent Value  Chronic Disease   Chronic disease during today's visit Chronic Obstructive Pulmonary Disease (COPD), Hypertension (HTN), Chronic Kidney Disease/End Stage Renal Disease (ESRD), Other  [Inability to Perform Activities of Daily Living Independently, Recurrent Depression, Cigarette Nicotine Dependence, Generalized Anxiety Disorder & History of Transient Ischemic Attack, Frequent Falls]  General Interventions   General Interventions Discussed/Reviewed General Interventions Discussed, General Interventions Reviewed, Communication with, Doctor Visits, [facility], Level of Care, Horticulturist, commercial (DME), Vaccines, Health Screening  [Communication with Care Team Members]  Labs Hgb A1c every 3 months, Kidney Function  [Encouraged]  Vaccines COVID-19, Flu, Pneumonia, RSV, Shingles, Tetanus/Pertussis/Diphtheria  [Encouraged]  Doctor Visits Discussed/Reviewed Doctor Visits Discussed, Doctor Visits Reviewed, Annual Wellness Visits, PCP, Specialist  [Encouraged Routine Engagement]  Health Screening Bone Density, Colonoscopy, Mammogram  [Encouraged]  Durable Medical Equipment (DME) BP Cuff, Other  [Scales, Prescription Eyeglasses]  PCP/Specialist Visits Compliance with follow-up visit  [Encouraged Routine Engagement]  Communication with PCP/Specialists, RN, Pharmacists, Social Work  [employer] Routine  Engagement]  Level of Care Adult Daycare, Air traffic controller, Assisted Living, Skilled Nursing Facility  [Confirmed Disinterest in Enrollment in Adult Day Care Program or Applying for Medicaid or Personal Care Services]  Applications Medicaid, Personal Care Services  [Confirmed Disinterest in Applying for Medicaid or Personal Care Services]  Exercise Interventions   Exercise Discussed/Reviewed Exercise Discussed, Assistive device use and maintanence, Exercise Reviewed, Physical Activity, Weight Managment  [Encouraged]  Physical Activity Discussed/Reviewed Physical Activity Discussed, PREP, Gym, Physical Activity Reviewed, Types of exercise, Home Exercise Program (HEP)  [Encouraged]  Weight Management Weight maintenance  [Encouraged]  Education Interventions   Education Provided Provided Education  [Encouraged Review]  Provided Engineer, petroleum On Nutrition, Air traffic controller, Development worker, community, Exercise, Medication, When to see the doctor, [facility], Mental Health/Coping with Illness  [Encouraged Consideration of Implementation Techniques Discussed.]  Ship broker, Personal Care Services  [Confirmed Disinterest in Applying for Medicaid or Personal Care Services]  Mental Health Interventions   Mental Health Discussed/Reviewed Mental Health Discussed, Grief and Loss, Substance Abuse, Mental Health Reviewed, Suicide, Coping Strategies, Crisis, Anxiety, Depression  [Assessed Mental Health Status]  Nutrition Interventions   Nutrition Discussed/Reviewed Nutrition Discussed, Fluid intake, Decreasing salt, Portion sizes, Supplemental nutrition, Nutrition Reviewed, Decreasing sugar intake, Carbohydrate meal planning, Increasing proteins, Decreasing fats, Adding fruits and vegetables  [Encouraged]  Pharmacy Interventions   Pharmacy Dicussed/Reviewed Pharmacy Topics Discussed, Pharmacy Topics Reviewed, Medications and their functions, Medication Adherence, Affording Medications  [Confirmed Medication  Compliance]  Medication Adherence --  [Confirmed Ability to [employer] Prescription Medications]  Safety Interventions   Safety Discussed/Reviewed Safety Discussed, Safety Reviewed, Fall Risk, Home Safety  [Encouraged Consideration of Home Safety Evaluation]  Home Safety Assistive Devices, Need for home safety assessment, Refer for home visit, Contact provider for referral to PT/OT, Refer for community resources, Contact home health agency  [  Encouraged Consideration of Home Health Physical & Occupational Therapy Services & Purchasing a Rollator Walker.]  Advanced Directive Interventions   Advanced Directives Discussed/Reviewed Advanced Directives Discussed, Advanced Directives Reviewed  [Encouraged Initiation of Advanced Directives, Offering to [employer] & Assist with Completion.]      Active Listening & Reflection Utilized.  Verbalization of Feelings Encouraged.  Emotional Support Provided. Cognitive Behavioral Therapy Indicated. Acceptance & Commitment Therapy Initiated. Client-Centered Therapy Performed. Encouraged Termination of Unhealthy, Stressful, & Anxiety-Provoking Relationship with Current In-Home Caregiver.  Encouraged Routine Engagement with [name], Licensed Clinical Social Worker with [facility] [phone]), if You Have Questions, Need Assistance, or If Additional Social Work Needs Are Identified Between Now & Our Next Follow-Up Outreach Call, Scheduled on 07/15/2023 at 9:45 AM.      SDOH assessments and interventions completed:  Yes.  Care Coordination Interventions:  Yes, provided.   Follow up plan: Follow up call scheduled for 07/15/2023 at 9:45 am.  Encounter Outcome:  Patient Visit Completed.   [name], BSW, MSW, LCSW [facility], [facility] Clinical Social Worker II Direct Dial: [phone]  Fax: [phone] Website: [email or web address]

## 2023-06-19 ENCOUNTER — Other Ambulatory Visit: Payer: Self-pay | Admitting: *Deleted

## 2023-06-19 DIAGNOSIS — J441 Chronic obstructive pulmonary disease with (acute) exacerbation: Secondary | ICD-10-CM

## 2023-06-19 LAB — CBC WITH DIFFERENTIAL/PLATELET
Basophils Absolute: 0 10*3/uL (ref 0.0–0.2)
Basos: 0 %
EOS (ABSOLUTE): 0.1 10*3/uL (ref 0.0–0.4)
Eos: 2 %
Hematocrit: 43.7 % (ref 34.0–46.6)
Hemoglobin: 14.2 g/dL (ref 11.1–15.9)
Immature Grans (Abs): 0 10*3/uL (ref 0.0–0.1)
Immature Granulocytes: 0 %
Lymphocytes Absolute: 1.4 10*3/uL (ref 0.7–3.1)
Lymphs: 31 %
MCH: 30.5 pg (ref 26.6–33.0)
MCHC: 32.5 g/dL (ref 31.5–35.7)
MCV: 94 fL (ref 79–97)
Monocytes Absolute: 0.4 10*3/uL (ref 0.1–0.9)
Monocytes: 9 %
Neutrophils Absolute: 2.7 10*3/uL (ref 1.4–7.0)
Neutrophils: 58 %
Platelets: 143 10*3/uL — ABNORMAL LOW (ref 150–450)
RBC: 4.65 x10E6/uL (ref 3.77–5.28)
RDW: 12.3 % (ref 11.7–15.4)
WBC: 4.7 10*3/uL (ref 3.4–10.8)

## 2023-06-19 LAB — PHOSPHORUS: Phosphorus: 2.9 mg/dL — ABNORMAL LOW (ref 3.0–4.3)

## 2023-06-19 MED ORDER — BUDESONIDE-FORMOTEROL FUMARATE 160-4.5 MCG/ACT IN AERO: 2.0000 | INHALATION_SPRAY | Freq: Two times a day (BID) | RESPIRATORY_TRACT | 0 refills | Status: DC

## 2023-06-19 MED ORDER — ALBUTEROL SULFATE HFA 108 (90 BASE) MCG/ACT IN AERS: 2.0000 | INHALATION_SPRAY | Freq: Four times a day (QID) | RESPIRATORY_TRACT | 0 refills | Status: DC | PRN

## 2023-07-04 ENCOUNTER — Encounter: Payer: Self-pay | Admitting: *Deleted

## 2023-07-04 ENCOUNTER — Other Ambulatory Visit: Payer: Self-pay | Admitting: *Deleted

## 2023-07-04 NOTE — Patient Outreach (Signed)
 Care Management   Visit Note  07/04/2023 Name: [name] MRN: [id number] DOB: [birth year]  Subjective: [name] is a 83 y.o. year old female who is a primary care patient of Zollie Lowers, MD. The Care Management team was consulted for assistance.      Engaged with patient spoke with patient by telephone.   Goals Addressed             This Visit's Progress    RNCM Care Management Expected Outcomes: Monitor, Self-Manage, Reduce Symptoms of: COPD,CKD, HTN, Falls       Current Barriers:  Chronic Disease Management support and education needs related to CHF and HTN   RNCM Clinical Goal(s):  Patient will verbalize basic understanding of  HTN, COPD, and CKD Stage 3A disease process and self health management plan as evidenced by verbal explanation, recognizing symptoms, lifestyle modifications attend all scheduled medical appointments: with primary care provider and specialist as evidenced by keeping all scheduled appointments demonstrate Improved and Ongoing adherence to prescribed treatment plan for HTN, COPD, and CKD Stage 3A as evidenced by consistent medication compliance, symptom monitoring, continued lifestyle changes continue to work with RN Care Manager to address care management and care coordination needs related to  HTN, COPD, and CKD Stage 3A as evidenced by adherence to CM Team Scheduled appointments work with child psychotherapist to address  related to the management of Level of care concerns related to the management of HTN, COPD, and CKD Stage 3A as evidenced by review of EMR and patient or child psychotherapist report through collaboration with Medical Illustrator, provider, and care team.   Interventions: Evaluation of current treatment plan related to  self management and patient's adherence to plan as established by provider    Chronic Kidney Disease Interventions:  (Status:  Goal on track:  Yes.) Long Term Goal Assessed the Patient understanding of chronic kidney disease     Evaluation of current treatment plan related to chronic kidney disease self management and patient's adherence to plan as established by provider      Provided education to patient re: stroke prevention, s/s of heart attack and stroke    Reviewed medications with patient and discussed importance of compliance    Counseled on adverse effects of illicit drug and excessive alcohol use in patients with chronic kidney disease    Counseled on the importance of exercise goals with target of 150 minutes per week     Advised patient, providing education and rationale, to monitor blood pressure daily and record, calling PCP for findings outside established parameters    Discussed complications of poorly controlled blood pressure such as heart disease, stroke, circulatory complications, vision complications, kidney impairment, sexual dysfunction    Reviewed scheduled/upcoming provider appointments including    Advised patient to discuss new changes with provider    Discussed plans with patient for ongoing care management follow up and provided patient with direct contact information for care management team    Screening for signs and symptoms of depression related to chronic disease state      Last practice recorded BP readings:  BP Readings from Last 3 Encounters:  06/18/23 (!) 204/86  06/11/23 130/84  05/15/23 (!) 188/80   Most recent eGFR/CrCl:  Lab Results  Component Value Date   EGFR 42 (L) 06/11/2023    No components found for: CRCL    COPD Interventions:  (Status:  Goal on track:  Yes.) Long Term Goal Provided patient with basic written and verbal COPD  education on self care/management/and exacerbation prevention. Reports congestion and cough. States she has been around someone that tested psitive for Flu A. RNCM advised to follow up with PCP or report to ED/Urgent Care. Advised patient to track and manage COPD triggers Provided written and verbal instructions on pursed lip breathing and  utilized returned demonstration as teach back Provided instruction about proper use of medications used for management of COPD including inhalers Advised patient to self assesses COPD action plan zone and make appointment with provider if in the yellow zone for 48 hours without improvement Advised patient to engage in light exercise as tolerated 3-5 days a week to aid in the the management of COPD Provided education about and advised patient to utilize infection prevention strategies to reduce risk of respiratory infection Discussed the importance of adequate rest and management of fatigue with COPD Screening for signs and symptoms of depression related to chronic disease state  Assessed social determinant of health barriers   Falls Interventions:  (Status:  Goal on track:  Yes.) Long Term Goal Provided written and verbal education re: potential causes of falls and Fall prevention strategies Reviewed medications and discussed potential side effects of medications such as dizziness and frequent urination Advised patient of importance of notifying provider of falls Assessed for signs and symptoms of orthostatic hypotension Assessed for falls since last encounter Assessed patients knowledge of fall risk prevention secondary to previously provided education Provided patient information for fall alert systems Assessed working status of life alert bracelet and patient adherence Advised patient to discuss increase in falls/injury with provider Screening for signs and symptoms of depression related to chronic disease state  Assessed social determinant of health barriers   Hypertension Interventions:  (Status:  Goal on track:  NO.) Long Term Goal Last practice recorded BP readings:  BP Readings from Last 3 Encounters:  06/18/23 (!) 204/86  06/11/23 130/84  05/15/23 (!) 188/80   Most recent eGFR/CrCl:  Lab Results  Component Value Date   EGFR 42 (L) 06/11/2023    No components found for:  CRCL  Evaluation of current treatment plan related to hypertension self management and patient's adherence to plan as established by provider Provided education to patient re: stroke prevention, s/s of heart attack and stroke. Education and support provided. Reviewed medications with patient and discussed importance of compliance. Reports compliance with all medications Counseled on adverse effects of illicit drug and excessive alcohol use in patients with high blood pressure  Counseled on the importance of exercise goals with target of 150 minutes per week Discussed plans with patient for ongoing care management follow up and provided patient with direct contact information for care management team Advised patient, providing education and rationale, to monitor blood pressure daily and record, calling PCP for findings outside established parameters Reviewed scheduled/upcoming provider appointments including: 07-22-23 with PCP Advised patient to discuss increased BP home BP readings with provider Provided education on prescribed diet Heart Healthy/Low Sodium Discussed complications of poorly controlled blood pressure such as heart disease, stroke, circulatory complications, vision complications, kidney impairment, sexual dysfunction Screening for signs and symptoms of depression related to chronic disease state  Assessed social determinant of health barriers  Patient Goals/Self-Care Activities: Take all medications as prescribed Attend all scheduled provider appointments Call pharmacy for medication refills 3-7 days in advance of running out of medications Attend church or other social activities Perform all self care activities independently  Perform IADL's (shopping, preparing meals, housekeeping, managing finances) independently Call provider office for new  concerns or questions  Work with the child psychotherapist to address care coordination needs and will continue to work with the clinical team  to address health care and disease management related needs identify and remove indoor air pollutants eliminate symptom triggers at home check blood pressure daily write blood pressure results in a log or diary take blood pressure log to all doctor appointments call doctor for signs and symptoms of high blood pressure take medications for blood pressure exactly as prescribed eat more whole grains, fruits and vegetables, lean meats and healthy fats  Follow Up Plan:  Telephone follow up appointment with care management team member scheduled for:  08-07-2023 at 9:00 am       COMPLETED: RNCM Care Plan (CONGESTIVE HEART FAILURE) EXPECTED OUTCOME: MONITOR, SELF-MANAGE AND REDUCE SYMPTOMS OF CONGESTIVE HEART FAILURE       Current Barriers:  Knowledge Deficits related to Congestive Heart Failure management Care Coordination needs related to application for PCS/ assistance in the home in a patient with CHF Chronic Disease Management support and education needs related to Congestive Heart Failure, diet No Advanced Directives in place- pt requests information Patient reports she lives with significant other [name] Jewett (he is going through his own health issues at present), pt reports she is overall independent but is starting to feel like she is having issues with bathing, sometimes her partner has to assist her, pt does not use any assistive devices, states she would consider a shower seat and this may be helpful. Patient states she plans to apply for PCS through medicaid benefit with assistance of child psychotherapist.  Patient reports she quit smoking 4 months ago and has done well with this, pt uses [facility] transportation benefit for most all transportation needs. Patient reports she checks blood pressure daily , states it's been good. 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   Wt Readings from Last 3 Encounters:  03/05/23 135 lb (61.2 kg)  03/04/23  134 lb 9.6 oz (61.1 kg)  01/23/23 135 lb 3.2 oz (61.3 kg)   Self Reported 03-27-2023 135 lbs  Planned Interventions: Discussed importance of daily weight and advised patient to weigh and record daily Reviewed role of diuretics in prevention of fluid overload and management of heart failure Discussed the importance of keeping all appointments with provider Reinforced low sodium diet Reinforced Heart Failure action plan  Symptom Management: Take medications as prescribed   Attend all scheduled provider appointments Call pharmacy for medication refills 3-7 days in advance of running out of medications Attend church or other social activities Call provider office for new concerns or questions  Work with the social worker to address care coordination needs and will continue to work with the clinical team to address health care and disease management related needs call office if I gain more than 2 pounds in one day or 5 pounds in one week keep legs up while sitting track weight in diary use salt in moderation watch for swelling in feet, ankles and legs every day weigh myself daily eat more whole grains, fruits and vegetables, lean meats and healthy fats dress right for the weather, hot or cold Follow heart failure action plan  Follow Up Plan: Telephone follow up appointment with care management team member scheduled for: 05-10-2023 at 3:45 pm       COMPLETED: RNCM Care Plan CVA EXPECTED OUTCOME: MONITOR, SELF-MANAGE AND REDUCE SYMPTOMS OF CVA       Current Barriers:  Knowledge Deficits related to CVA management Chronic Disease Management support and education needs related to CVA No Advanced Directives in place- documents previously mailed Patient 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 and states  they think I had a light stroke when I went to ED on 7/12  pt reports her blood pressure was very high Patient reports 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 Patient is  drinking Ensure TID and eats at least 2 meals per day Patient stopped smoking 4 months ago No new concerns reported today  Planned Interventions: Reviewed Importance of taking all medications as prescribed Reviewed Importance of attending all scheduled provider appointments Advised to report any changes in symptoms or exercise tolerance Screening for signs and symptoms of depression related to chronic disease state;  Assessed social determinant of health barriers Assessed for signs and symptoms of stroke Reviewed the importance of exercise Assessed for cognitive impairment Assessed for fall status and safety in the home Assessed use of tobacco use Reinforced importance of keeping blood pressure under good control for stroke prevention and overall healthy Encouragement given for smoking cessation  Symptom Management: Take medications as prescribed   Attend all scheduled provider appointments Call pharmacy for medication refills 3-7 days in advance of running out of medications Attend church or other social activities Call provider office for new concerns or questions  call the Suicide and Crisis Lifeline: 988 call the [place]  National Suicide Prevention Lifeline: 830-703-7139 or TTY: 717-859-6376 TTY 209-307-8106) to talk to a trained counselor call 1-800-273-TALK (toll free, 24 hour hotline) go to [facility] [address] [place] [phone]) call the [facility] Crisis Line: 262-016-0276 call 911 if experiencing a Mental Health or Behavioral Health Crisis   Follow Up Plan: Telephone follow up appointment with care management team member scheduled for:  05-10-2023 at 3:45 pm       COMPLETED: RNCM Care Plan Related to Falls/Safety       Current Barriers:  Equipment/DME No Current DME but wants to consider a cane Functional/Safety History of CVA with 2 falls in the last month Knowledge Deficits related to plan of care for  management of Falls  Care Coordination needs related to ADL IADL limitations and Inability to perform IADL's independently Chronic Disease Management support and education needs related to Falls/Safety Concerns   RNCM Clinical Goal(s):  Patient will verbalize basic understanding of  falls and safety awareness related to  disease process and self health management plan as evidenced by no recurrent falls take all medications exactly as prescribed and will call provider for medication related questions as evidenced by medication adherence attend all scheduled medical appointments: with specialist and PCP as evidenced by making follow up appointment with PCP and keeping all specialist appointments as recommended. continue to work with RN Care Manager to address care management and care coordination needs related to  falls and safety as evidenced by adherence to CM Team Scheduled appointments work with social worker to address  related to the management of ADL IADL limitations related to the management of falls and safety awareness as evidenced by review of EMR and patient or social worker report experience decrease in ED visits as evidenced by EMR review.  ED visits in in last 6 months = 3  through collaboration with RN Care manager, provider, and care team.   Interventions: Evaluation of current treatment plan related to  self management and patient's adherence to plan as established by provider  12/24/2022   12:06 PM 01/23/2023   10:53 AM 01/23/2023   10:59 AM 03/04/2023   11:35 AM 03/04/2023   11:42 AM  Fall Risk  Falls in the past year? 1 1 1 1 1   Was there an injury with Fall? 1 1 0 0 0  Fall Risk Category Calculator 2 2 1 1 1   Patient at Risk for Falls Due to History of fall(s);Impaired balance/gait;Impaired mobility;Impaired vision;Mental status change History of fall(s) History of fall(s) History of fall(s) History of fall(s)  Fall risk Follow up Falls evaluation completed;Follow up  appointment;Education provided;Falls prevention discussed  Falls evaluation completed Falls evaluation completed Falls evaluation completed     Falls Interventions:  (Status:  New goal. and Goal on track:  Yes.) Long Term Goal Provided written and verbal education re: potential causes of falls and Fall prevention strategies Reviewed medications and discussed potential side effects of medications such as dizziness and frequent urination Advised patient of importance of notifying provider of falls Assessed for signs and symptoms of orthostatic hypotension Assessed for falls since last encounter Assessed patients knowledge of fall risk prevention secondary to previously provided education Provided patient information for fall alert systems Assessed working status of life alert bracelet and patient adherence Advised patient to discuss any new health concerns as they arise with provider Screening for signs and symptoms of depression related to chronic disease state  Assessed social determinant of health barriers  Patient Goals/Self-Care Activities: Take all medications as prescribed Attend all scheduled provider appointments Call pharmacy for medication refills 3-7 days in advance of running out of medications Attend church or other social activities Perform all self care activities independently  Perform IADL's (shopping, preparing meals, housekeeping, managing finances) independently Call provider office for new concerns or questions  Work with the social worker to address care coordination needs and will continue to work with the clinical team to address health care and disease management related needs call the Suicide and Crisis Lifeline: 988 call the [place]  National Suicide Prevention Lifeline: 763 371 6968 or TTY: 215-085-2684 TTY 516-475-0532) to talk to a trained counselor call 1-800-273-TALK (toll free, 24 hour hotline) call the [facility]: 580 354 7643 call 911  if experiencing a Mental Health or Behavioral Health Crisis     Follow Up Plan:  Telephone follow up appointment with care management team member scheduled for:  05-10-2023 at 3:45 pm               Consent to Services:  Patient was given information about care management services, agreed to services, and gave verbal consent to participate.   Plan: Telephone follow up appointment with care management team member scheduled for:08-07-2023 at 9:00 am  Rosina Sicard, BSN RN [facility], [facility] RN Care Manager Direct Dial: [phone]  Fax: [phone]

## 2023-07-04 NOTE — Patient Instructions (Signed)
 Visit Information  Thank you for taking time to visit with me today. Please don't hesitate to contact me if I can be of assistance to you before our next scheduled telephone appointment.  Following are the goals we discussed today:   Goals Addressed             This Visit's Progress    RNCM Care Management Expected Outcomes: Monitor, Self-Manage, Reduce Symptoms of: COPD,CKD, HTN, Falls       Current Barriers:  Chronic Disease Management support and education needs related to CHF and HTN   RNCM Clinical Goal(s):  Patient will verbalize basic understanding of  HTN, COPD, and CKD Stage 3A disease process and self health management plan as evidenced by verbal explanation, recognizing symptoms, lifestyle modifications attend all scheduled medical appointments: with primary care provider and specialist as evidenced by keeping all scheduled appointments demonstrate Improved and Ongoing adherence to prescribed treatment plan for HTN, COPD, and CKD Stage 3A as evidenced by consistent medication compliance, symptom monitoring, continued lifestyle changes continue to work with RN Care Manager to address care management and care coordination needs related to  HTN, COPD, and CKD Stage 3A as evidenced by adherence to CM Team Scheduled appointments work with child psychotherapist to address  related to the management of Level of care concerns related to the management of HTN, COPD, and CKD Stage 3A as evidenced by review of EMR and patient or child psychotherapist report through collaboration with Medical Illustrator, provider, and care team.   Interventions: Evaluation of current treatment plan related to  self management and patient's adherence to plan as established by provider    Chronic Kidney Disease Interventions:  (Status:  Goal on track:  Yes.) Long Term Goal Assessed the Patient understanding of chronic kidney disease    Evaluation of current treatment plan related to chronic kidney disease self management and  patient's adherence to plan as established by provider      Provided education to patient re: stroke prevention, s/s of heart attack and stroke    Reviewed medications with patient and discussed importance of compliance    Counseled on adverse effects of illicit drug and excessive alcohol use in patients with chronic kidney disease    Counseled on the importance of exercise goals with target of 150 minutes per week     Advised patient, providing education and rationale, to monitor blood pressure daily and record, calling PCP for findings outside established parameters    Discussed complications of poorly controlled blood pressure such as heart disease, stroke, circulatory complications, vision complications, kidney impairment, sexual dysfunction    Reviewed scheduled/upcoming provider appointments including    Advised patient to discuss new changes with provider    Discussed plans with patient for ongoing care management follow up and provided patient with direct contact information for care management team    Screening for signs and symptoms of depression related to chronic disease state      Last practice recorded BP readings:  BP Readings from Last 3 Encounters:  06/18/23 (!) 204/86  06/11/23 130/84  05/15/23 (!) 188/80   Most recent eGFR/CrCl:  Lab Results  Component Value Date   EGFR 42 (L) 06/11/2023    No components found for: CRCL    COPD Interventions:  (Status:  Goal on track:  Yes.) Long Term Goal Provided patient with basic written and verbal COPD education on self care/management/and exacerbation prevention. Reports congestion and cough. States she has been around someone that tested  psitive for Flu A. RNCM advised to follow up with PCP or report to ED/Urgent Care. Advised patient to track and manage COPD triggers Provided written and verbal instructions on pursed lip breathing and utilized returned demonstration as teach back Provided instruction about proper use of  medications used for management of COPD including inhalers Advised patient to self assesses COPD action plan zone and make appointment with provider if in the yellow zone for 48 hours without improvement Advised patient to engage in light exercise as tolerated 3-5 days a week to aid in the the management of COPD Provided education about and advised patient to utilize infection prevention strategies to reduce risk of respiratory infection Discussed the importance of adequate rest and management of fatigue with COPD Screening for signs and symptoms of depression related to chronic disease state  Assessed social determinant of health barriers   Falls Interventions:  (Status:  Goal on track:  Yes.) Long Term Goal Provided written and verbal education re: potential causes of falls and Fall prevention strategies Reviewed medications and discussed potential side effects of medications such as dizziness and frequent urination Advised patient of importance of notifying provider of falls Assessed for signs and symptoms of orthostatic hypotension Assessed for falls since last encounter Assessed patients knowledge of fall risk prevention secondary to previously provided education Provided patient information for fall alert systems Assessed working status of life alert bracelet and patient adherence Advised patient to discuss increase in falls/injury with provider Screening for signs and symptoms of depression related to chronic disease state  Assessed social determinant of health barriers   Hypertension Interventions:  (Status:  Goal on track:  NO.) Long Term Goal Last practice recorded BP readings:  BP Readings from Last 3 Encounters:  06/18/23 (!) 204/86  06/11/23 130/84  05/15/23 (!) 188/80   Most recent eGFR/CrCl:  Lab Results  Component Value Date   EGFR 42 (L) 06/11/2023    No components found for: CRCL  Evaluation of current treatment plan related to hypertension self management and  patient's adherence to plan as established by provider Provided education to patient re: stroke prevention, s/s of heart attack and stroke. Education and support provided. Reviewed medications with patient and discussed importance of compliance. Reports compliance with all medications Counseled on adverse effects of illicit drug and excessive alcohol use in patients with high blood pressure  Counseled on the importance of exercise goals with target of 150 minutes per week Discussed plans with patient for ongoing care management follow up and provided patient with direct contact information for care management team Advised patient, providing education and rationale, to monitor blood pressure daily and record, calling PCP for findings outside established parameters Reviewed scheduled/upcoming provider appointments including: 07-22-23 with PCP Advised patient to discuss increased BP home BP readings with provider Provided education on prescribed diet Heart Healthy/Low Sodium Discussed complications of poorly controlled blood pressure such as heart disease, stroke, circulatory complications, vision complications, kidney impairment, sexual dysfunction Screening for signs and symptoms of depression related to chronic disease state  Assessed social determinant of health barriers  Patient Goals/Self-Care Activities: Take all medications as prescribed Attend all scheduled provider appointments Call pharmacy for medication refills 3-7 days in advance of running out of medications Attend church or other social activities Perform all self care activities independently  Perform IADL's (shopping, preparing meals, housekeeping, managing finances) independently Call provider office for new concerns or questions  Work with the social worker to address care coordination needs and will continue to  work with the clinical team to address health care and disease management related needs identify and remove indoor  air pollutants eliminate symptom triggers at home check blood pressure daily write blood pressure results in a log or diary take blood pressure log to all doctor appointments call doctor for signs and symptoms of high blood pressure take medications for blood pressure exactly as prescribed eat more whole grains, fruits and vegetables, lean meats and healthy fats  Follow Up Plan:  Telephone follow up appointment with care management team member scheduled for:  08-07-2023 at 9:00 am       COMPLETED: RNCM Care Plan (CONGESTIVE HEART FAILURE) EXPECTED OUTCOME: MONITOR, SELF-MANAGE AND REDUCE SYMPTOMS OF CONGESTIVE HEART FAILURE       Current Barriers:  Knowledge Deficits related to Congestive Heart Failure management Care Coordination needs related to application for PCS/ assistance in the home in a patient with CHF Chronic Disease Management support and education needs related to Congestive Heart Failure, diet No Advanced Directives in place- pt requests information Patient reports she lives with significant other [name] Jewett (he is going through his own health issues at present), pt reports she is overall independent but is starting to feel like she is having issues with bathing, sometimes her partner has to assist her, pt does not use any assistive devices, states she would consider a shower seat and this may be helpful. Patient states she plans to apply for PCS through medicaid benefit with assistance of child psychotherapist.  Patient reports she quit smoking 4 months ago and has done well with this, pt uses [facility] transportation benefit for most all transportation needs. Patient reports she checks blood pressure daily , states it's been good. 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   Wt Readings from Last 3 Encounters:  03/05/23 135 lb (61.2 kg)  03/04/23 134 lb 9.6 oz (61.1 kg)  01/23/23 135 lb 3.2 oz (61.3 kg)   Self Reported 03-27-2023  135 lbs  Planned Interventions: Discussed importance of daily weight and advised patient to weigh and record daily Reviewed role of diuretics in prevention of fluid overload and management of heart failure Discussed the importance of keeping all appointments with provider Reinforced low sodium diet Reinforced Heart Failure action plan  Symptom Management: Take medications as prescribed   Attend all scheduled provider appointments Call pharmacy for medication refills 3-7 days in advance of running out of medications Attend church or other social activities Call provider office for new concerns or questions  Work with the social worker to address care coordination needs and will continue to work with the clinical team to address health care and disease management related needs call office if I gain more than 2 pounds in one day or 5 pounds in one week keep legs up while sitting track weight in diary use salt in moderation watch for swelling in feet, ankles and legs every day weigh myself daily eat more whole grains, fruits and vegetables, lean meats and healthy fats dress right for the weather, hot or cold Follow heart failure action plan  Follow Up Plan: Telephone follow up appointment with care management team member scheduled for: 05-10-2023 at 3:45 pm       COMPLETED: RNCM Care Plan CVA EXPECTED OUTCOME: MONITOR, SELF-MANAGE AND REDUCE SYMPTOMS OF CVA       Current Barriers:  Knowledge Deficits related to CVA management Chronic Disease Management support and education needs related to CVA No  Advanced Directives in place- documents previously mailed Patient 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 and states  they think I had a light stroke when I went to ED on 7/12  pt reports her blood pressure was very high Patient reports 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 Patient is drinking Ensure TID and eats at least 2 meals per day Patient stopped smoking 4 months  ago No new concerns reported today  Planned Interventions: Reviewed Importance of taking all medications as prescribed Reviewed Importance of attending all scheduled provider appointments Advised to report any changes in symptoms or exercise tolerance Screening for signs and symptoms of depression related to chronic disease state;  Assessed social determinant of health barriers Assessed for signs and symptoms of stroke Reviewed the importance of exercise Assessed for cognitive impairment Assessed for fall status and safety in the home Assessed use of tobacco use Reinforced importance of keeping blood pressure under good control for stroke prevention and overall healthy Encouragement given for smoking cessation  Symptom Management: Take medications as prescribed   Attend all scheduled provider appointments Call pharmacy for medication refills 3-7 days in advance of running out of medications Attend church or other social activities Call provider office for new concerns or questions  call the Suicide and Crisis Lifeline: 988 call the [place]  National Suicide Prevention Lifeline: 508-026-4106 or TTY: (916) 342-8242 TTY 3025807840) to talk to a trained counselor call 1-800-273-TALK (toll free, 24 hour hotline) go to [facility] [address], [place] [phone]) call the [facility] Crisis Line: 947-230-6399 call 911 if experiencing a Mental Health or Behavioral Health Crisis   Follow Up Plan: Telephone follow up appointment with care management team member scheduled for:  05-10-2023 at 3:45 pm       COMPLETED: RNCM Care Plan Related to Falls/Safety       Current Barriers:  Equipment/DME No Current DME but wants to consider a cane Functional/Safety History of CVA with 2 falls in the last month Knowledge Deficits related to plan of care for management of Falls  Care Coordination needs related to ADL IADL limitations and Inability to  perform IADL's independently Chronic Disease Management support and education needs related to Falls/Safety Concerns   RNCM Clinical Goal(s):  Patient will verbalize basic understanding of  falls and safety awareness related to  disease process and self health management plan as evidenced by no recurrent falls take all medications exactly as prescribed and will call provider for medication related questions as evidenced by medication adherence attend all scheduled medical appointments: with specialist and PCP as evidenced by making follow up appointment with PCP and keeping all specialist appointments as recommended. continue to work with RN Care Manager to address care management and care coordination needs related to  falls and safety as evidenced by adherence to CM Team Scheduled appointments work with social worker to address  related to the management of ADL IADL limitations related to the management of falls and safety awareness as evidenced by review of EMR and patient or social worker report experience decrease in ED visits as evidenced by EMR review.  ED visits in in last 6 months = 3  through collaboration with RN Care manager, provider, and care team.   Interventions: Evaluation of current treatment plan related to  self management and patient's adherence to plan as established by provider     12/24/2022   12:06 PM 01/23/2023   10:53 AM 01/23/2023   10:59 AM  03/04/2023   11:35 AM 03/04/2023   11:42 AM  Fall Risk  Falls in the past year? 1 1 1 1 1   Was there an injury with Fall? 1 1 0 0 0  Fall Risk Category Calculator 2 2 1 1 1   Patient at Risk for Falls Due to History of fall(s);Impaired balance/gait;Impaired mobility;Impaired vision;Mental status change History of fall(s) History of fall(s) History of fall(s) History of fall(s)  Fall risk Follow up Falls evaluation completed;Follow up appointment;Education provided;Falls prevention discussed  Falls evaluation completed Falls  evaluation completed Falls evaluation completed     Falls Interventions:  (Status:  New goal. and Goal on track:  Yes.) Long Term Goal Provided written and verbal education re: potential causes of falls and Fall prevention strategies Reviewed medications and discussed potential side effects of medications such as dizziness and frequent urination Advised patient of importance of notifying provider of falls Assessed for signs and symptoms of orthostatic hypotension Assessed for falls since last encounter Assessed patients knowledge of fall risk prevention secondary to previously provided education Provided patient information for fall alert systems Assessed working status of life alert bracelet and patient adherence Advised patient to discuss any new health concerns as they arise with provider Screening for signs and symptoms of depression related to chronic disease state  Assessed social determinant of health barriers  Patient Goals/Self-Care Activities: Take all medications as prescribed Attend all scheduled provider appointments Call pharmacy for medication refills 3-7 days in advance of running out of medications Attend church or other social activities Perform all self care activities independently  Perform IADL's (shopping, preparing meals, housekeeping, managing finances) independently Call provider office for new concerns or questions  Work with the social worker to address care coordination needs and will continue to work with the clinical team to address health care and disease management related needs call the Suicide and Crisis Lifeline: 988 call the [place]  National Suicide Prevention Lifeline: 907-553-7018 or TTY: 931-168-4878 TTY 2504441067) to talk to a trained counselor call 1-800-273-TALK (toll free, 24 hour hotline) call the [facility]: (678) 725-4611 call 911 if experiencing a Mental Health or Behavioral Health Crisis     Follow Up Plan:   Telephone follow up appointment with care management team member scheduled for:  05-10-2023 at 3:45 pm           Our next appointment is by telephone on 08-07-2023 at 9:00 am  Please call the care guide team at [phone] if you need to cancel or reschedule your appointment.   If you are experiencing a Mental Health or Behavioral Health Crisis or need someone to talk to, please call the Suicide and Crisis Lifeline: 988 call the [place]  National Suicide Prevention Lifeline: 219-577-6939 or TTY: 313-792-2074 TTY 617-166-8893) to talk to a trained counselor call 1-800-273-TALK (toll free, 24 hour hotline) go to [facility] [address], [place] [phone])   The patient verbalized understanding of instructions, educational materials, and care plan provided today and DECLINED offer to receive copy of patient instructions, educational materials, and care plan.   Telephone follow up appointment with care management team member scheduled for:08-07-2023 at 9:00 am  Rosina Sicard, BSN RN [facility] RN Care Manager Direct Dial: [phone]  Fax: [phone]

## 2023-07-10 ENCOUNTER — Encounter (HOSPITAL_COMMUNITY): Payer: Self-pay

## 2023-07-10 ENCOUNTER — Encounter (HOSPITAL_COMMUNITY): Payer: Self-pay | Admitting: Cardiology

## 2023-07-10 ENCOUNTER — Other Ambulatory Visit: Payer: Self-pay

## 2023-07-10 ENCOUNTER — Inpatient Hospital Stay (HOSPITAL_COMMUNITY)
Admission: EM | Admit: 2023-07-10 | Discharge: 2023-07-15 | DRG: 281 | Disposition: A | Discharge status: Home / Self Care | Payer: No Typology Code available for payment source | Source: Other Acute Inpatient Hospital | Attending: Cardiovascular Disease | Admitting: Cardiovascular Disease

## 2023-07-10 DIAGNOSIS — Z813 Family history of other psychoactive substance abuse and dependence: Secondary | ICD-10-CM

## 2023-07-10 DIAGNOSIS — Z7951 Long term (current) use of inhaled steroids: Secondary | ICD-10-CM | POA: Diagnosis not present

## 2023-07-10 DIAGNOSIS — I309 Acute pericarditis, unspecified: Secondary | ICD-10-CM | POA: Diagnosis present

## 2023-07-10 DIAGNOSIS — Z720 Tobacco use: Secondary | ICD-10-CM | POA: Diagnosis not present

## 2023-07-10 DIAGNOSIS — Z7983 Long term (current) use of bisphosphonates: Secondary | ICD-10-CM | POA: Diagnosis not present

## 2023-07-10 DIAGNOSIS — Z791 Long term (current) use of non-steroidal anti-inflammatories (NSAID): Secondary | ICD-10-CM

## 2023-07-10 DIAGNOSIS — Z7902 Long term (current) use of antithrombotics/antiplatelets: Secondary | ICD-10-CM

## 2023-07-10 DIAGNOSIS — I300 Acute nonspecific idiopathic pericarditis: Secondary | ICD-10-CM

## 2023-07-10 DIAGNOSIS — R6889 Other general symptoms and signs: Secondary | ICD-10-CM | POA: Diagnosis not present

## 2023-07-10 DIAGNOSIS — I129 Hypertensive chronic kidney disease with stage 1 through stage 4 chronic kidney disease, or unspecified chronic kidney disease: Secondary | ICD-10-CM | POA: Diagnosis present

## 2023-07-10 DIAGNOSIS — Z79899 Other long term (current) drug therapy: Secondary | ICD-10-CM

## 2023-07-10 DIAGNOSIS — I10 Essential (primary) hypertension: Secondary | ICD-10-CM | POA: Diagnosis not present

## 2023-07-10 DIAGNOSIS — I509 Heart failure, unspecified: Secondary | ICD-10-CM | POA: Diagnosis not present

## 2023-07-10 DIAGNOSIS — I214 Non-ST elevation (NSTEMI) myocardial infarction: Secondary | ICD-10-CM | POA: Diagnosis not present

## 2023-07-10 DIAGNOSIS — Z8673 Personal history of transient ischemic attack (TIA), and cerebral infarction without residual deficits: Secondary | ICD-10-CM

## 2023-07-10 DIAGNOSIS — Z825 Family history of asthma and other chronic lower respiratory diseases: Secondary | ICD-10-CM | POA: Diagnosis not present

## 2023-07-10 DIAGNOSIS — R0689 Other abnormalities of breathing: Secondary | ICD-10-CM | POA: Diagnosis not present

## 2023-07-10 DIAGNOSIS — K219 Gastro-esophageal reflux disease without esophagitis: Secondary | ICD-10-CM | POA: Diagnosis present

## 2023-07-10 DIAGNOSIS — I2510 Atherosclerotic heart disease of native coronary artery without angina pectoris: Secondary | ICD-10-CM | POA: Diagnosis present

## 2023-07-10 DIAGNOSIS — J101 Influenza due to other identified influenza virus with other respiratory manifestations: Secondary | ICD-10-CM | POA: Diagnosis present

## 2023-07-10 DIAGNOSIS — F17210 Nicotine dependence, cigarettes, uncomplicated: Secondary | ICD-10-CM | POA: Diagnosis not present

## 2023-07-10 DIAGNOSIS — M1990 Unspecified osteoarthritis, unspecified site: Secondary | ICD-10-CM | POA: Diagnosis present

## 2023-07-10 DIAGNOSIS — R0602 Shortness of breath: Secondary | ICD-10-CM | POA: Diagnosis not present

## 2023-07-10 DIAGNOSIS — R931 Abnormal findings on diagnostic imaging of heart and coronary circulation: Secondary | ICD-10-CM

## 2023-07-10 DIAGNOSIS — Z880 Allergy status to penicillin: Secondary | ICD-10-CM | POA: Diagnosis not present

## 2023-07-10 DIAGNOSIS — R001 Bradycardia, unspecified: Secondary | ICD-10-CM | POA: Diagnosis not present

## 2023-07-10 DIAGNOSIS — E785 Hyperlipidemia, unspecified: Secondary | ICD-10-CM | POA: Diagnosis present

## 2023-07-10 DIAGNOSIS — J449 Chronic obstructive pulmonary disease, unspecified: Secondary | ICD-10-CM | POA: Diagnosis present

## 2023-07-10 DIAGNOSIS — N1831 Chronic kidney disease, stage 3a: Secondary | ICD-10-CM | POA: Diagnosis present

## 2023-07-10 DIAGNOSIS — Z87891 Personal history of nicotine dependence: Secondary | ICD-10-CM | POA: Diagnosis not present

## 2023-07-10 DIAGNOSIS — I499 Cardiac arrhythmia, unspecified: Secondary | ICD-10-CM | POA: Diagnosis not present

## 2023-07-10 DIAGNOSIS — Z743 Need for continuous supervision: Secondary | ICD-10-CM | POA: Diagnosis not present

## 2023-07-10 DIAGNOSIS — R079 Chest pain, unspecified: Secondary | ICD-10-CM | POA: Diagnosis present

## 2023-07-10 DIAGNOSIS — R0600 Dyspnea, unspecified: Secondary | ICD-10-CM | POA: Diagnosis not present

## 2023-07-10 DIAGNOSIS — G4762 Sleep related leg cramps: Secondary | ICD-10-CM | POA: Diagnosis not present

## 2023-07-10 HISTORY — DX: Non-ST elevation (NSTEMI) myocardial infarction: I21.4

## 2023-07-10 LAB — HEPARIN LEVEL (UNFRACTIONATED): Heparin Unfractionated: 0.88 [IU]/mL — ABNORMAL HIGH (ref 0.30–0.70)

## 2023-07-10 LAB — MRSA NEXT GEN BY PCR, NASAL: MRSA by PCR Next Gen: NOT DETECTED

## 2023-07-10 LAB — GLUCOSE, CAPILLARY: Glucose-Capillary: 116 mg/dL — ABNORMAL HIGH (ref 70–99)

## 2023-07-10 MED ORDER — FUROSEMIDE 20 MG PO TABS
20.0000 mg | ORAL_TABLET | Freq: Every day | ORAL | Status: DC
  Administered 2023-07-11 – 2023-07-15 (×5): 20 mg via ORAL
  Filled 2023-07-10 (×5): qty 1

## 2023-07-10 MED ORDER — MOMETASONE FURO-FORMOTEROL FUM 200-5 MCG/ACT IN AERO
2.0000 | INHALATION_SPRAY | Freq: Two times a day (BID) | RESPIRATORY_TRACT | Status: DC
  Administered 2023-07-11 – 2023-07-15 (×8): 2 via RESPIRATORY_TRACT
  Filled 2023-07-10: qty 8.8

## 2023-07-10 MED ORDER — SODIUM CHLORIDE 0.9 % WEIGHT BASED INFUSION: 1.0000 mL/kg/h | INTRAVENOUS | Status: DC

## 2023-07-10 MED ORDER — ASPIRIN 81 MG PO CHEW
81.0000 mg | CHEWABLE_TABLET | ORAL | Status: AC
  Administered 2023-07-11: 81 mg via ORAL
  Filled 2023-07-10: qty 1

## 2023-07-10 MED ORDER — AMLODIPINE-OLMESARTAN 10-40 MG PO TABS: 1.0000 | ORAL_TABLET | Freq: Every day | ORAL | Status: DC

## 2023-07-10 MED ORDER — SODIUM CHLORIDE 0.9 % WEIGHT BASED INFUSION
3.0000 mL/kg/h | INTRAVENOUS | Status: AC
  Administered 2023-07-11: 3 mL/kg/h via INTRAVENOUS

## 2023-07-10 MED ORDER — NITROGLYCERIN 2 % TD OINT
1.0000 [in_us] | TOPICAL_OINTMENT | Freq: Four times a day (QID) | TRANSDERMAL | Status: DC
  Administered 2023-07-10: 1 [in_us] via TOPICAL
  Filled 2023-07-10: qty 30

## 2023-07-10 MED ORDER — ISOSORBIDE MONONITRATE ER 60 MG PO TB24
60.0000 mg | ORAL_TABLET | Freq: Every day | ORAL | Status: DC
  Administered 2023-07-10 – 2023-07-11 (×2): 60 mg via ORAL
  Filled 2023-07-10 (×2): qty 1

## 2023-07-10 MED ORDER — HEPARIN (PORCINE) 25000 UT/250ML-% IV SOLN: 850.0000 [IU]/h | INTRAVENOUS | Status: DC

## 2023-07-10 MED ORDER — ACETAMINOPHEN 325 MG PO TABS
975.0000 mg | ORAL_TABLET | Freq: Four times a day (QID) | ORAL | Status: DC | PRN
  Administered 2023-07-10 – 2023-07-14 (×3): 975 mg via ORAL
  Filled 2023-07-10 (×3): qty 3

## 2023-07-10 MED ORDER — ONDANSETRON HCL 4 MG/2ML IJ SOLN
4.0000 mg | Freq: Four times a day (QID) | INTRAMUSCULAR | Status: DC | PRN
  Administered 2023-07-10: 4 mg via INTRAVENOUS
  Filled 2023-07-10: qty 2

## 2023-07-10 MED ORDER — ALBUTEROL SULFATE (2.5 MG/3ML) 0.083% IN NEBU
2.5000 mg | INHALATION_SOLUTION | RESPIRATORY_TRACT | Status: DC | PRN
  Administered 2023-07-11: 2.5 mg via RESPIRATORY_TRACT
  Filled 2023-07-10: qty 3

## 2023-07-10 MED ORDER — ISOSORBIDE MONONITRATE ER 60 MG PO TB24: 60.0000 mg | ORAL_TABLET | Freq: Every day | ORAL | Status: DC

## 2023-07-10 MED ORDER — ROPINIROLE HCL 1 MG PO TABS
1.0000 mg | ORAL_TABLET | Freq: Every day | ORAL | Status: DC
  Administered 2023-07-10 – 2023-07-14 (×5): 1 mg via ORAL
  Filled 2023-07-10 (×6): qty 1

## 2023-07-10 MED ORDER — ASPIRIN 81 MG PO TBEC
81.0000 mg | DELAYED_RELEASE_TABLET | Freq: Every day | ORAL | Status: DC
  Administered 2023-07-12 – 2023-07-13 (×2): 81 mg via ORAL
  Filled 2023-07-10 (×3): qty 1

## 2023-07-10 MED ORDER — ATORVASTATIN CALCIUM 80 MG PO TABS
80.0000 mg | ORAL_TABLET | Freq: Every day | ORAL | Status: DC
  Administered 2023-07-10 – 2023-07-14 (×5): 80 mg via ORAL
  Filled 2023-07-10 (×5): qty 1

## 2023-07-10 MED ORDER — CLOPIDOGREL BISULFATE 75 MG PO TABS
75.0000 mg | ORAL_TABLET | Freq: Every day | ORAL | Status: DC
  Administered 2023-07-10 – 2023-07-15 (×6): 75 mg via ORAL
  Filled 2023-07-10 (×6): qty 1

## 2023-07-10 MED ORDER — ALBUTEROL SULFATE HFA 108 (90 BASE) MCG/ACT IN AERS: 2.0000 | INHALATION_SPRAY | Freq: Four times a day (QID) | RESPIRATORY_TRACT | Status: DC | PRN

## 2023-07-10 MED ORDER — NITROGLYCERIN 0.4 MG SL SUBL
0.4000 mg | SUBLINGUAL_TABLET | SUBLINGUAL | Status: DC | PRN
  Administered 2023-07-10 (×4): 0.4 mg via SUBLINGUAL
  Filled 2023-07-10: qty 1

## 2023-07-10 MED ORDER — IRBESARTAN 150 MG PO TABS
300.0000 mg | ORAL_TABLET | Freq: Every day | ORAL | Status: DC
Start: 2023-07-10 — End: 2023-07-15
  Administered 2023-07-10 – 2023-07-15 (×6): 300 mg via ORAL
  Filled 2023-07-10 (×6): qty 2

## 2023-07-10 MED ORDER — AMLODIPINE BESYLATE 10 MG PO TABS
10.0000 mg | ORAL_TABLET | Freq: Every day | ORAL | Status: DC
  Administered 2023-07-10 – 2023-07-15 (×5): 10 mg via ORAL
  Filled 2023-07-10 (×6): qty 1

## 2023-07-10 MED ORDER — ACETAMINOPHEN 325 MG PO TABS: 650.0000 mg | ORAL_TABLET | ORAL | Status: DC | PRN

## 2023-07-10 NOTE — H&P (Addendum)
Cardiology Admission History and Physical   Patient ID: REGINNA SERMENO MRN: [id number]; DOB: April 18, 1941   Admission date: 07/10/2023  PCP:  Mechele Claude, MD   [facility] Providers Cardiologist:  Chilton Si, MD   {  Chief Complaint:  chest pain   Patient Profile:   [name] is a 83 y.o. 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  History of Present Illness:   [name] has previously been seen in 2023 for atypical chest pain, cardiac CTA ordered that showed CAC score 396 putting her at 85th percentile.  Moderate nonobstructive CAD with negative FFR.  Patient was previously seen by Dr. Chestine Spore as CTA of chest abdomen and pelvis revealed partially calcified atherosclerotic plaque with focal stenosis of the left renal artery with moderate atrophy of the left kidney.  Patient had good celiac trunk and SMA flow.  CT of the head obtained in July 2024 showed new but age-indeterminate infarct in the left basal ganglia.  Subsequent ZIO monitor in August 2024 was negative for A-fib.  Patient started having flulike symptom on 07/05/2023 Friday.  She started having cough, chills, body ache, and shortness of breath.  Symptom initially got better however worsened in the morning of 07/10/2023.  She says she woke up around 4:30 AM feeling more short of breath.  She went to the bathroom, and on her way back to her bed she feels she may have passed out.  She woke up in the bed without remembering how she got back to the bed.  She says she was feeling so poorly she called out to her husband to call 911.  She was subsequently sent to [facility] for further evaluation.  On arrival, her blood pressure was elevated in the 160s.  Chest x-ray normal.  Serial troponin 57--366-->551.  proBNP 637.  UA showed negative  nitrite, negative leukocyte, small amount of blood, no bacteria.  Sodium 150, potassium 3.5, chloride at 110, BUN 12, creatinine 1.24, eGFR 44.  Patient was subsequently transferred to [facility] due to elevated troponin.  Although [facility] record mention patient was in rate controlled A-fib, however also EKG that was sent over along with the patient indicated she was in sinus rhythm with PACs.  What was concerning on the EKG is although the underlying rhythm is sinus rhythm, however there was progressive T wave inversion in the anterior leads on the subsequent EKG.  This T wave inversion was not present on the first EKG.   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   Stroke (HCC)    SLIGHT RT SIDE WEAKNESS 2001   Unintentional weight loss 03/19/2022    Past Surgical History:  Procedure Laterality Date   ABDOMINAL HYSTERECTOMY     ANTERIOR CERVICAL CORPECTOMY  03/07/2012   Procedure: ANTERIOR CERVICAL CORPECTOMY;  Surgeon: Barnett Abu, MD;  Location: [facility];  Service: Neurosurgery;  Laterality: N/A;  Cervical six-seven, cervical  seven-thoracic one Anterior cervical decompression/diskectomy/fusion, with Cervical seven Corpectomy, reconstruction using Allograft and Alphatec plate   CATARACT EXTRACTION W/PHACO Left 04/12/2017   Procedure: CATARACT EXTRACTION PHACO AND INTRAOCULAR LENS PLACEMENT (IOC);  Surgeon: Fabio Pierce, MD;  Location: [facility] ORS;  Service: Ophthalmology;  Laterality: Left;  CDE: 3.82   CATARACT EXTRACTION W/PHACO Right 05/03/2017   Procedure: CATARACT EXTRACTION PHACO AND INTRAOCULAR LENS PLACEMENT RIGHT EYE;   Surgeon: Fabio Pierce, MD;  Location: [facility] ORS;  Service: Ophthalmology;  Laterality: Right;  CDE: 3.89   MULTIPLE TOOTH EXTRACTIONS     TONSILLECTOMY       Medications Prior to Admission: Prior to Admission medications   Medication Sig Start Date End Date Taking? Authorizing Provider  acetaminophen (TYLENOL) 500 MG tablet Take 2 tablets (1,000 mg total) by mouth 3 (three) times daily. 10/30/22   Mechele Claude, MD  albuterol (VENTOLIN HFA) 108 (90 Base) MCG/ACT inhaler Inhale 2 puffs into the lungs every 6 (six) hours as needed for wheezing or shortness of breath. 06/19/23   Mechele Claude, MD  alendronate (FOSAMAX) 70 MG tablet Take 1 tablet (70 mg total) by mouth every 7 (seven) days. Take with a full glass of water on an empty stomach. 01/29/23   Mechele Claude, MD  amLODipine-olmesartan (AZOR) 10-40 MG tablet Take 1 tablet by mouth daily. For blood pressure. 06/11/23   Alver Sorrow, NP  arformoterol (BROVANA) 15 MCG/2ML NEBU Take 2 mLs (15 mcg total) by nebulization 2 (two) times daily. 01/02/23   Hunsucker, Lesia Sago, MD  atorvastatin (LIPITOR) 80 MG tablet Take 1 tablet (80 mg total) by mouth daily. 06/11/23   Alver Sorrow, NP  budesonide (PULMICORT) 0.5 MG/2ML nebulizer solution Take 2 mLs (0.5 mg total) by nebulization 2 (two) times daily. 01/02/23   Hunsucker, Lesia Sago, MD  budesonide-formoterol (SYMBICORT) 160-4.5 MCG/ACT inhaler Inhale 2 puffs into the lungs 2 (two) times daily. 06/19/23   Mechele Claude, MD  cloNIDine (CATAPRES - DOSED IN MG/24 HR) 0.2 mg/24hr patch Place 1 patch (0.2 mg total) onto the skin once a week. 06/18/23   Mechele Claude, MD  clopidogrel (PLAVIX) 75 MG tablet Take 1 tablet (75 mg total) by mouth daily. 06/11/23   Alver Sorrow, NP  furosemide (LASIX) 20 MG tablet Take 1 tablet (20 mg total) by mouth daily. For swelling 06/11/23   Alver Sorrow, NP  ipratropium-albuterol (DUONEB) 0.5-2.5 (3) MG/3ML SOLN Take 3 mLs by nebulization in the morning and at  bedtime. 01/02/23   Hunsucker, Lesia Sago, MD  isosorbide mononitrate (IMDUR) 60 MG 24 hr tablet Take 1 tablet (60 mg total) by mouth daily. For blood pressure and to prevent chest pain. 06/11/23 09/09/23  Alver Sorrow, NP  Magnesium 250 MG TABS Take 1 tablet (250 mg total) by mouth daily. 06/18/23   Mechele Claude, MD  meloxicam (MOBIC) 7.5 MG tablet Take 1 tablet (7.5 mg total) by mouth daily. For joint and muscle pain 03/04/23   Mechele Claude, MD  potassium chloride SA (KLOR-CON M) 20 MEQ tablet Take 1 tablet (20 mEq total) by mouth daily. For potassium replacement/ supplement 12/11/22   Mechele Claude, MD  rOPINIRole (REQUIP) 1 MG tablet Take 1 tablet (1 mg total) by mouth at bedtime. For leg cramps 06/18/23   Mechele Claude, MD  SUMAtriptan (IMITREX) 100 MG tablet Take one at onset of HA. May repeat in 2 hours if headache persists or recurs. Limit two per 24 hours 03/04/23   Mechele Claude, MD  vitamin B-12 1000 MCG tablet Take 1 tablet (1,000 mcg total) by mouth daily. 09/21/22   Johnson, Clanford L, MD  Vitamin D, Ergocalciferol, (DRISDOL) 1.25 MG (50000 UNIT) CAPS capsule Take 1 capsule (50,000 Units total) by mouth every 7 (seven) days. 10/31/22   Mechele Claude, MD     Allergies:    Allergies  Allergen Reactions   Penicillins Anaphylaxis, Swelling, Rash and Other (See Comments)    Has patient had a PCN reaction causing immediate rash, facial/tongue/throat swelling, SOB or lightheadedness with hypotension: yes Has patient had a PCN reaction causing severe rash involving mucus membranes or skin necrosis: no Has patient had a PCN reaction that required hospitalization: yes Has patient had a PCN reaction occurring within the last 10 years: no If all of the above answers are "NO", then may proceed with Cephalosporin use.     Social History:   Social History   Socioeconomic History   Marital status: Widowed    Spouse name: Not on file   Number of children: 5   Years of education: 77    Highest education level: 12th grade  Occupational History   Occupation: retired    Comment: CNA  Tobacco Use   Smoking status: Former    Current packs/day: 0.00    Average packs/day: 0.5 packs/day for 61.8 years (30.9 ttl pk-yrs)    Types: Cigarettes    Start date: 63    Quit date: 03/24/2022    Years since quitting: 1.2    Passive exposure: Past   Smokeless tobacco: Never  Vaping Use   Vaping status: Never Used  Substance and Sexual Activity   Alcohol use: Not Currently   Drug use: No   Sexual activity: Not Currently    Partners: Male    Birth control/protection: Surgical  Other Topics Concern   Not on file  Social History Narrative   5 children living, 1 deceased.    Children live out of state.   Social Drivers of Corporate investment banker Strain: Low Risk  (04/12/2023)   Overall Financial Resource Strain (CARDIA)    Difficulty of Paying Living Expenses: Not hard at all  Food Insecurity: No Food Insecurity (07/10/2023)   Hunger Vital Sign    Worried About Running Out of Food in the Last Year: Never true    Ran Out of Food in the Last Year: Never true  Transportation Needs: No Transportation Needs (07/10/2023)   PRAPARE - Administrator, Civil Service (Medical): No    Lack of Transportation (Non-Medical): No  Physical Activity: Inactive (04/12/2023)   Exercise Vital Sign    Days of Exercise per Week: 0 days    Minutes of Exercise per Session: 0 min  Stress: No Stress Concern Present (04/12/2023)   [employer] of Occupational Health - Occupational Stress Questionnaire    Feeling of Stress : Only a little  Social Connections: Unknown (07/10/2023)   Social Connection and Isolation Panel [NHANES]    Frequency of Communication with Friends and Family: More than three times a week    Frequency of Social Gatherings with Friends and Family: More than three times a week    Attends Religious Services: More than 4 times per year    Active Member of [employer]  or Organizations: Yes    Attends Banker Meetings: More than 4 times per year    Marital Status: Patient declined  Intimate Partner Violence: Not At Risk (07/10/2023)   Humiliation, Afraid, Rape, and  Kick questionnaire    Fear of Current or Ex-Partner: No    Emotionally Abused: No    Physically Abused: No    Sexually Abused: No    Family History:   The patient's family history includes Asthma in her daughter; Bipolar disorder in her daughter, daughter, daughter, daughter, and son; Drug abuse in her daughter; Heart disease in her daughter and daughter; Hypertension in her daughter. She was adopted.    ROS:  Please see the history of present illness.  All other ROS reviewed and negative.     Physical Exam/Data:   Vitals:   07/10/23 1814  BP: (!) 172/97  Pulse: 89  Resp: (!) 23  Temp: 98.5 F (36.9 C)  TempSrc: Oral  SpO2: 99%  Weight: 53.6 kg  Height: 5\' 1"  (1.549 m)   No intake or output data in the 24 hours ending 07/10/23 1839    07/10/2023    6:14 PM 06/18/2023    1:01 PM 06/11/2023    8:26 AM  Last 3 Weights  Weight (lbs) 118 lb 2.7 oz 135 lb 134 lb 3.2 oz  Weight (kg) 53.6 kg 61.236 kg 60.873 kg     Body mass index is 22.33 kg/m.  General:  Well nourished, well developed, in no acute distress HEENT: normal Neck: no JVD Vascular: No carotid bruits; Distal pulses 2+ bilaterally   Cardiac:  normal S1, S2; RRR; no murmur  Lungs:  clear to auscultation bilaterally, no wheezing, rhonchi or rales  Abd: soft, nontender, no hepatomegaly  Ext: no edema Musculoskeletal:  No deformities, BUE and BLE strength normal and equal Skin: warm and dry  Neuro:  CNs 2-12 intact, no focal abnormalities noted Psych:  Normal affect    EKG:  The ECG that was done at [facility] and was personally reviewed and demonstrates sinus rhythm with PAC, T wave inversion in the anterior leads.  Relevant CV Studies: Echocardiogram 09/17/2022 1. Left ventricular ejection  fraction, by estimation, is >75%. The left  ventricle has hyperdynamic function. The left ventricle has no regional  wall motion abnormalities. Left ventricular diastolic parameters are  consistent with Grade I diastolic  dysfunction (impaired relaxation).   2. Right ventricular systolic function is normal. The right ventricular  size is normal. Tricuspid regurgitation signal is inadequate for assessing  PA pressure.   3. The mitral valve is normal in structure. No evidence of mitral valve  regurgitation. No evidence of mitral stenosis.   4. The aortic valve was not well visualized. Aortic valve regurgitation  is not visualized. No aortic stenosis is present.   5. The inferior vena cava is normal in size with greater than 50%  respiratory variability, suggesting right atrial pressure of 3 mmHg.   Comparison(s): No prior Echocardiogram.   9 Day Zio Monitor 01/14/2023   Quality: Fair.  Baseline artifact. Predominant rhythm: sinus rhythm Average heart rate: 80 bpm Max heart rate: 121 bpm Min heart rate: 48 bpm Pauses >2.5 seconds: none   Rare PACs and PVCs (<1%)  5 episodes of SVT     Tiffany C. Duke Salvia, MD, [facility] 02/17/2023 9:39 AM Laboratory Data:  High Sensitivity Troponin:  No results for input(s): "TROPONINIHS" in the last 720 hours.    ChemistryNo results for input(s): "NA", "K", "CL", "CO2", "GLUCOSE", "BUN", "CREATININE", "CALCIUM", "MG", "GFRNONAA", "GFRAA", "ANIONGAP" in the last 168 hours.  No results for input(s): "PROT", "ALBUMIN", "AST", "ALT", "ALKPHOS", "BILITOT" in the last 168 hours. Lipids No results for input(s): "CHOL", "TRIG", "HDL", "  LABVLDL", "LDLCALC", "CHOLHDL" in the last 168 hours. HematologyNo results for input(s): "WBC", "RBC", "HGB", "HCT", "MCV", "MCH", "MCHC", "RDW", "PLT" in the last 168 hours. Thyroid No results for input(s): "TSH", "FREET4" in the last 168 hours. BNPNo results for input(s): "BNP", "PROBNP" in the last 168 hours.  DDimer No  results for input(s): "DDIMER" in the last 168 hours.   Radiology/Studies:  No results found.   Assessment and Plan:   Chest pain Coronary CAC score 396, 85th percentile.  Negative FFR. The characteristic of the chest pain is atypical, it is worse with deep inspiration and cough.  This is in the setting of influenza A. -However, there is progressive T wave inversion in the anterior leads on 3 EKGs that were sent over with the patient taken several hours apart earlier today.  Serial troponin trended up from 57-->366-->551 -Case discussed with Dr. Servando Salina, this patient will warrant a ischemic evaluation via cardiac catheterization due to EKG changes and the elevation of the troponin.  Hypertension: Continue Imdur, amlodipine-olmesartan  Renal artery stenosis  Influenza A: Symptoms for started on 2/7.  Outside treatment period with tamiflu.  Symptomatic management  Risk Assessment/Risk Scores:    TIMI Risk Score for Unstable Angina or Non-ST Elevation MI:   The patient's TIMI risk score is 6, which indicates a 41% risk of all cause mortality, new or recurrent myocardial infarction or need for urgent revascularization in the next 14 days.  Code Status: Full Code Severity of Illness: The appropriate patient status for this patient is OBSERVATION. Observation status is judged to be reasonable and necessary in order to provide the required intensity of service to ensure the patient's safety. The patient's presenting symptoms, physical exam findings, and initial radiographic and laboratory data in the context of their medical condition is felt to place them at decreased risk for further clinical deterioration. Furthermore, it is anticipated that the patient will be medically stable for discharge from the hospital within 2 midnights of admission.    For questions or updates, please contact [facility] Please consult [email or web address] for contact info under    Ramond Dial, [place]   07/10/2023 6:39 PM    Patient seen and examined, note reviewed with the signed Advanced Practice Provider. I personally reviewed laboratory data, imaging studies and relevant notes. I independently examined the patient and formulated the important aspects of the plan. I have personally discussed the plan with the patient and/or family. Comments or changes to the note/plan are indicated below.   Patient was seen examined by her bedside. Is a 82 year old woman with history of CAD by coronary CTA, hyperlipidemia, penetrating aortic ulcer, history of CVA back in 2024, hypertension, renal artery stenosis, CKD. She is presenting with reported chest pain.  She tells me that she has been experiencing significant flulike symptoms body aches cough and worsening shortness of breath.  This morning she believes that she woke up and has significant short of breath went to the bathroom but notes that she passed out because she woke up in her bed and was unsure how she got there.  She did not talk to her husband who was concerned and called 911 she was taken to [facility].  In the ED she was noted to have troponin that was trending up peaked at 551, proBNP elevated with EKG changes concerning for ischemia she was transferred to [facility].   GEN:  Well nourished, well developed in no acute distress HEENT: Mucous membranes moist, good dentition  NECK: No JVD; No carotid bruits LYMPHATICS: No lymphadenopathy CARDIAC: S1S2 noted, RRR, no murmurs, rubs, gallops RESPIRATORY:  Clear to auscultation without rales, wheezing or rhonchi  ABDOMEN: Soft, non-tender, non-distended, bowel sounds noted, no guarding EXTREMITIES:No cyanosis, no cyanosis, no clubbing MUSCULOSKELETAL: No deformity  SKIN: Warm and dry NEUROLOGIC:  Alert and oriented x 3, nonfocal PSYCHIATRIC:  Normal affect, good insight  Chest pain/NSTEMI history of coronary artery disease on coronary CTA Hypertension Hyperlipidemia Flu A  Her  symptoms her clinical picture is concerning with her elevated trending up troponin ischemic changes on the EKG during the time of her chest pain and her risk factors.  It is best to pursue an ischemic evaluation in this patient.  Left heart catheterization at this time will be the best test.  I have talked to the patient she is in agreement with this.  The patient understands that risks include but are not limited to stroke (1 in 1000), death (1 in 1000), kidney failure [usually temporary] (1 in 500), bleeding (1 in 200), allergic reaction [possibly serious] (1 in 200), and agrees to proceed.  In terms of her antihypertensive continue this. Please continue to monitor her kidney she does have chronic kidney disease.  Influenza A observation in supportive treatment   Thomasene Ripple DO, MS [facility] Attending Cardiologist [facility]  [address] [place], [place] [address] [phone] Website: [email or web address]

## 2023-07-10 NOTE — Plan of Care (Signed)

## 2023-07-10 NOTE — Progress Notes (Addendum)
PHARMACY - ANTICOAGULATION CONSULT NOTE  Pharmacy Consult for heparin  Indication: chest pain/ACS  Allergies  Allergen Reactions   Penicillins Anaphylaxis, Swelling, Rash and Other (See Comments)    Has patient had a PCN reaction causing immediate rash, facial/tongue/throat swelling, SOB or lightheadedness with hypotension: yes Has patient had a PCN reaction causing severe rash involving mucus membranes or skin necrosis: no Has patient had a PCN reaction that required hospitalization: yes Has patient had a PCN reaction occurring within the last 10 years: no If all of the above answers are "NO", then may proceed with Cephalosporin use.     Patient Measurements: Height: 5\' 1"  (154.9 cm) Weight: 53.6 kg (118 lb 2.7 oz) IBW/kg (Calculated) : 47.8 Heparin Dosing Weight: 53.6 kg   Vital Signs: Temp: 98.5 F (36.9 C) (02/12 1814) Temp Source: Oral (02/12 1814) BP: 172/97 (02/12 1814) Pulse Rate: 89 (02/12 1814)  Labs: No results for input(s): "HGB", "HCT", "PLT", "APTT", "LABPROT", "INR", "HEPARINUNFRC", "HEPRLOWMOCWT", "CREATININE", "CKTOTAL", "CKMB", "TROPONINIHS" in the last 72 hours.  CrCl cannot be calculated (Patient's most recent lab result is older than the maximum 21 days allowed.).   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 Of note mention at [facility] for Afib rate controlled but EKG showing possible NSR with PACs.   Per OSH MAR was started on heparin 3000 unit bolus followed by 950 units/hr on 2/12 around 1230. Hgb 15.1, plt 184. Confirmed heparin infusion running at same rate upon arrival to [facility]. No s/sx of bleeding.  Goal of Therapy:  Heparin level 0.3-0.7 units/ml Monitor platelets by anticoagulation protocol: Yes   Plan:  Continue heparin infusion at 950 units/hr  Order STAT heparin level  Monitor daily HL, CBC, and for s/sx of bleeding   Thank you for allowing pharmacy to participate in this patient's care,  Sherron Monday, PharmD, BCCCP Clinical Pharmacist  Phone: [phone] 07/10/2023 7:23 PM  Please check AMION for all [facility] Pharmacy phone numbers After 10:00 PM, call Main Pharmacy [phone]  ADDENDUM Heparin level came back elevated at 0.88, on heparin infusion at 950 units/hr. No s/sx of bleeding or infusion issues. Reduce dose to 850 units/hr and get level in 8 hours.   Thank you for allowing pharmacy to participate in this patient's care,  Sherron Monday, PharmD, BCCCP Clinical Pharmacist

## 2023-07-11 ENCOUNTER — Encounter (HOSPITAL_COMMUNITY)
Admission: EM | Disposition: A | Discharge status: Home / Self Care | Payer: Self-pay | Source: Other Acute Inpatient Hospital | Attending: Cardiovascular Disease

## 2023-07-11 ENCOUNTER — Encounter (HOSPITAL_COMMUNITY): Payer: Self-pay | Admitting: Cardiology

## 2023-07-11 DIAGNOSIS — I214 Non-ST elevation (NSTEMI) myocardial infarction: Secondary | ICD-10-CM | POA: Diagnosis not present

## 2023-07-11 DIAGNOSIS — I2510 Atherosclerotic heart disease of native coronary artery without angina pectoris: Secondary | ICD-10-CM | POA: Diagnosis not present

## 2023-07-11 HISTORY — PX: LEFT HEART CATH AND CORONARY ANGIOGRAPHY: CATH118249

## 2023-07-11 LAB — BASIC METABOLIC PANEL
Anion gap: 9 (ref 5–15)
BUN: 17 mg/dL (ref 8–23)
CO2: 29 mmol/L (ref 22–32)
Calcium: 8.5 mg/dL — ABNORMAL LOW (ref 8.9–10.3)
Chloride: 103 mmol/L (ref 98–111)
Creatinine, Ser: 1.53 mg/dL — ABNORMAL HIGH (ref 0.44–1.00)
GFR, Estimated: 34 mL/min — ABNORMAL LOW (ref >60)
Glucose, Bld: 119 mg/dL — ABNORMAL HIGH (ref 70–99)
Potassium: 4.8 mmol/L (ref 3.5–5.1)
Sodium: 141 mmol/L (ref 135–145)

## 2023-07-11 LAB — LIPID PANEL
Cholesterol: 174 mg/dL (ref 0–200)
HDL: 52 mg/dL (ref >40)
LDL Cholesterol: 113 mg/dL — ABNORMAL HIGH (ref 0–99)
Total CHOL/HDL Ratio: 3.3 {ratio}
Triglycerides: 45 mg/dL (ref <150)
VLDL: 9 mg/dL (ref 0–40)

## 2023-07-11 LAB — CREATININE, SERUM
Creatinine, Ser: 1.34 mg/dL — ABNORMAL HIGH (ref 0.44–1.00)
GFR, Estimated: 40 mL/min — ABNORMAL LOW (ref >60)

## 2023-07-11 LAB — HEPARIN LEVEL (UNFRACTIONATED): Heparin Unfractionated: 1.03 [IU]/mL — ABNORMAL HIGH (ref 0.30–0.70)

## 2023-07-11 LAB — CBC
HCT: 39.6 % (ref 36.0–46.0)
Hemoglobin: 12.7 g/dL (ref 12.0–15.0)
MCH: 30.5 pg (ref 26.0–34.0)
MCHC: 32.1 g/dL (ref 30.0–36.0)
MCV: 95.2 fL (ref 80.0–100.0)
Platelets: 228 10*3/uL (ref 150–400)
RBC: 4.16 MIL/uL (ref 3.87–5.11)
RDW: 13.6 % (ref 11.5–15.5)
WBC: 6.2 10*3/uL (ref 4.0–10.5)
nRBC: 0 % (ref 0.0–0.2)

## 2023-07-11 SURGERY — LEFT HEART CATH AND CORONARY ANGIOGRAPHY
Anesthesia: LOCAL

## 2023-07-11 MED ORDER — HEPARIN SODIUM (PORCINE) 5000 UNIT/ML IJ SOLN
5000.0000 [IU] | Freq: Three times a day (TID) | INTRAMUSCULAR | Status: DC
  Administered 2023-07-11 – 2023-07-15 (×11): 5000 [IU] via SUBCUTANEOUS
  Filled 2023-07-11 (×11): qty 1

## 2023-07-11 MED ORDER — LIDOCAINE 5 % EX PTCH
1.0000 | MEDICATED_PATCH | CUTANEOUS | Status: DC
  Administered 2023-07-11 – 2023-07-15 (×5): 1 via TRANSDERMAL
  Filled 2023-07-11 (×5): qty 1

## 2023-07-11 MED ORDER — SODIUM CHLORIDE 0.9% FLUSH
3.0000 mL | Freq: Two times a day (BID) | INTRAVENOUS | Status: DC
Start: 2023-07-11 — End: 2023-07-15
  Administered 2023-07-12 – 2023-07-15 (×5): 3 mL via INTRAVENOUS

## 2023-07-11 MED ORDER — VERAPAMIL HCL 2.5 MG/ML IV SOLN
INTRAVENOUS | Status: DC | PRN
  Administered 2023-07-11: 10 mL via INTRA_ARTERIAL

## 2023-07-11 MED ORDER — LIDOCAINE HCL (PF) 1 % IJ SOLN
INTRAMUSCULAR | Status: DC | PRN
Start: 2023-07-11 — End: 2023-07-11
  Administered 2023-07-11: 2 mL

## 2023-07-11 MED ORDER — LIDOCAINE HCL (PF) 1 % IJ SOLN
INTRAMUSCULAR | Status: AC
  Filled 2023-07-11: qty 30

## 2023-07-11 MED ORDER — MIDAZOLAM HCL 2 MG/2ML IJ SOLN
INTRAMUSCULAR | Status: AC
  Filled 2023-07-11: qty 2

## 2023-07-11 MED ORDER — IOHEXOL 350 MG/ML SOLN
INTRAVENOUS | Status: DC | PRN
  Administered 2023-07-11: 30 mL

## 2023-07-11 MED ORDER — HEPARIN (PORCINE) 25000 UT/250ML-% IV SOLN
700.0000 [IU]/h | INTRAVENOUS | Status: DC
  Administered 2023-07-11: 700 [IU]/h via INTRAVENOUS

## 2023-07-11 MED ORDER — HEPARIN (PORCINE) IN NACL 2000-0.9 UNIT/L-% IV SOLN
INTRAVENOUS | Status: DC | PRN
  Administered 2023-07-11: 1000 mL

## 2023-07-11 MED ORDER — HEPARIN SODIUM (PORCINE) 1000 UNIT/ML IJ SOLN
INTRAMUSCULAR | Status: DC | PRN
  Administered 2023-07-11: 3000 [IU] via INTRAVENOUS

## 2023-07-11 MED ORDER — VERAPAMIL HCL 2.5 MG/ML IV SOLN
INTRAVENOUS | Status: AC
  Filled 2023-07-11: qty 2

## 2023-07-11 MED ORDER — FENTANYL CITRATE (PF) 100 MCG/2ML IJ SOLN
INTRAMUSCULAR | Status: DC | PRN
  Administered 2023-07-11: 25 ug via INTRAVENOUS

## 2023-07-11 MED ORDER — GUAIFENESIN-DM 100-10 MG/5ML PO SYRP
5.0000 mL | ORAL_SOLUTION | ORAL | Status: DC | PRN
  Administered 2023-07-11 – 2023-07-15 (×9): 5 mL via ORAL
  Filled 2023-07-11 (×9): qty 5

## 2023-07-11 MED ORDER — SODIUM CHLORIDE 0.9 % IV SOLN: 250.0000 mL | INTRAVENOUS | Status: AC | PRN

## 2023-07-11 MED ORDER — HYDRALAZINE HCL 20 MG/ML IJ SOLN: 10.0000 mg | INTRAMUSCULAR | Status: AC | PRN

## 2023-07-11 MED ORDER — FENTANYL CITRATE (PF) 100 MCG/2ML IJ SOLN
INTRAMUSCULAR | Status: AC
  Filled 2023-07-11: qty 2

## 2023-07-11 MED ORDER — ONDANSETRON HCL 4 MG/2ML IJ SOLN: 4.0000 mg | Freq: Four times a day (QID) | INTRAMUSCULAR | Status: DC | PRN

## 2023-07-11 MED ORDER — NITROGLYCERIN IN D5W 200-5 MCG/ML-% IV SOLN
2.0000 ug/min | INTRAVENOUS | Status: DC
  Administered 2023-07-11: 100 ug/min via INTRAVENOUS
  Filled 2023-07-11: qty 250

## 2023-07-11 MED ORDER — LIDOCAINE 5 % EX PTCH
1.0000 | MEDICATED_PATCH | CUTANEOUS | Status: DC
Start: 2023-07-11 — End: 2023-07-11

## 2023-07-11 MED ORDER — LABETALOL HCL 5 MG/ML IV SOLN: 10.0000 mg | INTRAVENOUS | Status: AC | PRN

## 2023-07-11 MED ORDER — SODIUM CHLORIDE 0.9% FLUSH: 3.0000 mL | INTRAVENOUS | Status: DC | PRN

## 2023-07-11 MED ORDER — MIDAZOLAM HCL 2 MG/2ML IJ SOLN
INTRAMUSCULAR | Status: DC | PRN
  Administered 2023-07-11: 1 mg via INTRAVENOUS

## 2023-07-11 MED ORDER — HEPARIN SODIUM (PORCINE) 1000 UNIT/ML IJ SOLN
INTRAMUSCULAR | Status: AC
  Filled 2023-07-11: qty 10

## 2023-07-11 SURGICAL SUPPLY — 8 items
CATH 5FR JL3.5 JR4 ANG PIG MP (CATHETERS) IMPLANT
DEVICE RAD COMP TR BAND LRG (VASCULAR PRODUCTS) IMPLANT
GLIDESHEATH SLEND A-KIT 6F 22G (SHEATH) IMPLANT
GUIDEWIRE INQWIRE 1.5J.035X260 (WIRE) IMPLANT
INQWIRE 1.5J .035X260CM (WIRE) ×1 IMPLANT
KIT SINGLE USE MANIFOLD (KITS) IMPLANT
PACK CARDIAC CATHETERIZATION (CUSTOM PROCEDURE TRAY) ×1 IMPLANT
SET ATX-X65L (MISCELLANEOUS) IMPLANT

## 2023-07-11 NOTE — Interval H&P Note (Signed)
History and Physical Interval Note:  07/11/2023 9:15 AM  [name]  has presented today for surgery, with the diagnosis of unstable angina.  The various methods of treatment have been discussed with the patient and family. After consideration of risks, benefits and other options for treatment, the patient has consented to  Procedure(s): LEFT HEART CATH AND CORONARY ANGIOGRAPHY (N/A) as a surgical intervention.  The patient's history has been reviewed, patient examined, no change in status, stable for surgery.  I have reviewed the patient's chart and labs.  Questions were answered to the patient's satisfaction.     Coriana Angello J Monish Haliburton

## 2023-07-11 NOTE — TOC Initial Note (Signed)
Transition of Care [facility]) - Initial/Assessment Note    Patient Details  Name: [name] MRN: [id number] Date of Birth: [birth year]  Transition of Care [facility]) CM/SW Contact:    Harriet Masson, RN Phone Number: 07/11/2023, 4:02 PM  Clinical Narrative:                  Spoke to patient regarding transition needs. Patient lives with Significant other, [name]. Patient has no DME. Patient requesting PCS aide. Form completed and left for MD to complete on hard chart. Patient has had PCS aide prior.  Patient uses [employer] for transportation. Patient uses [id number] - [place], [place] - [address]. Address, Phone number and PCP verified. TOC will continue to follow patient for PT, OT recs.  Expected Discharge Plan: Home w Home Health Services Barriers to Discharge: Continued Medical Work up   Patient Goals and CMS Choice Patient states their goals for this hospitalization and ongoing recovery are:: return home          Expected Discharge Plan and Services   Discharge Planning Services: CM Consult Post Acute Care Choice: Durable Medical Equipment Living arrangements for the past 2 months: Single Family Home                                      Prior Living Arrangements/Services Living arrangements for the past 2 months: Single Family Home Lives with:: Significant Other Patient language and need for interpreter reviewed:: Yes Do you feel safe going back to the place where you live?: Yes      Need for Family Participation in Patient Care: Yes (Comment) Care giver support system in place?: Yes (comment)   Criminal Activity/Legal Involvement Pertinent to Current Situation/Hospitalization: No - Comment as needed  Activities of Daily Living   ADL Screening (condition at time of admission) Independently performs ADLs?: Yes (appropriate for developmental age) Is the patient deaf or have difficulty hearing?: No Does the patient have difficulty seeing,  even when wearing glasses/contacts?: No Does the patient have difficulty concentrating, remembering, or making decisions?: No  Permission Sought/Granted                  Emotional Assessment Appearance:: Appears younger than stated age Attitude/Demeanor/Rapport: Engaged, Gracious Affect (typically observed): Accepting Orientation: : Oriented to Self, Oriented to Place, Oriented to  Time, Oriented to Situation Alcohol / Substance Use: Not Applicable Psych Involvement: No (comment)  Admission diagnosis:  NSTEMI (non-ST elevated myocardial infarction) [facility]) [I21.4] Patient Active Problem List   Diagnosis Date Noted   NSTEMI (non-ST elevated myocardial infarction) (HCC) 07/10/2023   B12 deficiency 09/20/2022   Pressure injury of skin 09/20/2022   Hypertensive urgency 09/16/2022   Left renal artery stenosis (HCC) 09/16/2022   GERD (gastroesophageal reflux disease) 03/25/2022   AKI (acute kidney injury) (HCC) 03/25/2022   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   Atypical chest pain 03/19/2022   Unintentional weight loss 03/19/2022   Ileus (HCC) 05/15/2021   Back pain with sciatica 02/21/2021   Rash 11/21/2020   PAD (peripheral artery disease) -Atherosclerotic ulcer of aorta -posterior aspect of the descending aorta measuring up to 4 mm in thickness 01/24/2020   Tobacco abuse 01/23/2020   PAD/Atherosclerotic ulcer of aorta -posterior aspect of the descending aorta measuring up to 4 mm in thickness 01/22/2020   GAD (generalized anxiety disorder) 08/26/2019   Osteopenia of multiple sites 08/26/2019  Depression, recurrent (HCC) 04/21/2019   Stage 3a chronic kidney disease (HCC) 04/21/2019   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   COPD with chronic bronchitis (HCC) 01/01/2019   Hyperlipidemia 07/09/2016   History of TIA (transient ischemic attack) 03/06/2016   Cigarette nicotine dependence without complication 03/06/2016   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   Cervical  myelopathy (HCC) 02/13/2012   Left-sided weakness 02/11/2012   Essential hypertension with goal blood pressure less than 130/80 02/11/2012   CALCANEAL SPUR 10/05/2009   PCP:  Mechele Claude, MD Pharmacy:   [facility] [place], [place] - [address] [place] [address] Phone: [phone] Fax: [phone]  [id number] [place], [place] - [address] [place] [address] Phone: [phone] Fax: [phone]     Social Drivers of Health (SDOH) Social History: SDOH Screenings   Food Insecurity: No Food Insecurity (07/10/2023)  Housing: Low Risk  (07/10/2023)  Transportation Needs: No Transportation Needs (07/10/2023)  Utilities: Not At Risk (07/10/2023)  Alcohol Screen: Low Risk  (04/12/2023)  Depression (PHQ2-9): Medium Risk (06/18/2023)  Financial Resource Strain: Low Risk  (04/12/2023)  Physical Activity: Inactive (04/12/2023)  Social Connections: Unknown (07/10/2023)  Stress: No Stress Concern Present (04/12/2023)  Tobacco Use: Medium Risk (07/11/2023)  Health Literacy: Adequate Health Literacy (04/12/2023)   SDOH Interventions:     Readmission Risk Interventions     No data to display

## 2023-07-11 NOTE — Progress Notes (Signed)
Progress Note  Patient Name: [name] Date of Encounter: 07/11/2023  Primary Cardiologist: Chilton Si, MD   Subjective   Patient seen and examined at her bedside- still with chest pain.   Inpatient Medications    Scheduled Meds:  amLODipine  10 mg Oral Daily   And   irbesartan  300 mg Oral Daily   aspirin EC  81 mg Oral Daily   atorvastatin  80 mg Oral QHS   clopidogrel  75 mg Oral Daily   furosemide  20 mg Oral Daily   isosorbide mononitrate  60 mg Oral Daily   mometasone-formoterol  2 puff Inhalation BID   rOPINIRole  1 mg Oral QHS   Continuous Infusions:  sodium chloride 1 mL/kg/hr (07/11/23 1610)   heparin 700 Units/hr (07/11/23 0811)   nitroGLYCERIN 116.667 mcg/min (07/11/23 0635)   PRN Meds: acetaminophen, albuterol   Vital Signs    Vitals:   07/11/23 0600 07/11/23 0615 07/11/23 0630 07/11/23 0700  BP: (!) 116/57 (!) 130/59 (!) 132/97   Pulse: 72 71 68   Resp: 15 16 14    Temp:      TempSrc:    Oral  SpO2: 95% 95% 96%   Weight:      Height:        Intake/Output Summary (Last 24 hours) at 07/11/2023 9604 Last data filed at 07/11/2023 0610 Gross per 24 hour  Intake 142.44 ml  Output --  Net 142.44 ml   Filed Weights   07/10/23 1814  Weight: 53.6 kg    Telemetry     - Personally Reviewed  ECG     - Personally Reviewed  Physical Exam    General: Comfortable,  Head: Atraumatic, normal size  Eyes: PEERLA, EOMI  Neck: Supple, normal JVD Cardiac: Normal S1, S2; RRR; no murmurs, rubs, or gallops Lungs: Clear to auscultation bilaterally Abd: Soft, nontender, no hepatomegaly  Ext: warm, no edema Musculoskeletal: No deformities, BUE and BLE strength normal and equal Skin: Warm and dry, no rashes   Neuro: Alert and oriented to person, place, time, and situation, CNII-XII grossly intact, no focal deficits  Psych: Normal mood and affect   Labs    Chemistry Recent Labs  Lab 07/11/23 0618  NA 141  K 4.8  CL 103  CO2 29   GLUCOSE 119*  BUN 17  CREATININE 1.53*  CALCIUM 8.5*  GFRNONAA 34*  ANIONGAP 9     HematologyNo results for input(s): "WBC", "RBC", "HGB", "HCT", "MCV", "MCH", "MCHC", "RDW", "PLT" in the last 168 hours.  Cardiac EnzymesNo results for input(s): "TROPONINI" in the last 168 hours. No results for input(s): "TROPIPOC" in the last 168 hours.   BNPNo results for input(s): "BNP", "PROBNP" in the last 168 hours.   DDimer No results for input(s): "DDIMER" in the last 168 hours.   Radiology    No results found.  Cardiac Studies   Prior echo  Patient Profile     83 y.o. 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  Assessment & Plan    Chest pain/NSTEMI history of coronary artery disease on coronary CTA Hypertension Hyperlipidemia Flu A CKD    Her symptoms her clinical picture is concerning with her elevated trending up troponin ischemic changes on the EKG during the time of her chest pain and her risk factors.    It is best to pursue an ischemic evaluation in this patient.  Left heart catheterization at this time will be the best test.    I have talked to the patient she is in agreement with this.  The patient understands that risks include but are not limited to stroke (1 in 1000), death (1 in 1000), kidney failure [usually temporary] (1 in 500), bleeding (1 in 200), allergic reaction [possibly serious] (1 in 200), and agrees to proceed  Continue to monitor cr post LHC    For questions or updates, please contact [facility] Please consult [email or web address] for contact info under Cardiology/STEMI.    Signed, Skarlett Sedlacek, DO  07/11/2023, 8:12 AM

## 2023-07-11 NOTE — Plan of Care (Signed)

## 2023-07-11 NOTE — Progress Notes (Signed)
PHARMACY - ANTICOAGULATION CONSULT NOTE  Pharmacy Consult for heparin  Indication: chest pain/ACS  Patient Measurements: Height: 5\' 1"  (154.9 cm) Weight: 53.6 kg (118 lb 2.7 oz) IBW/kg (Calculated) : 47.8 Heparin Dosing Weight: 53.6 kg   Vital Signs: Temp: 98.6 F (37 C) (02/13 0525) Temp Source: Oral (02/13 0525) BP: 132/97 (02/13 0630) Pulse Rate: 68 (02/13 0630)  Labs: Recent Labs    07/10/23 1953 07/11/23 0618  HEPARINUNFRC 0.88* 1.03*    CrCl cannot be calculated (Patient's most recent lab result is older than the maximum 21 days allowed.).   Assessment: 82 yof presenting with CP and SOB at [facility]. Of note mention at [facility] for Afib rate controlled but EKG showing possible NSR with PACs.   2/13 AM: heparin level returned elevated at 1.03 after rate decrease to 850 units/hr. Per RN, no issues with heparin infusion running continuously or signs/symptoms of bleeding. Level drawn appropriately from opposite side of infusion. No AM CBC  Goal of Therapy:  Heparin level 0.3-0.7 units/ml Monitor platelets by anticoagulation protocol: Yes   Plan:  Hold heparin infusion for 1 hour Start heparin infusion at 700 units/hr  8h heparin level and CBC Monitor daily heparin level, CBC, and for s/sx of bleeding   Thank you for allowing pharmacy to participate in this patient's care,  Arabella Merles, PharmD. Clinical Pharmacist 07/11/2023 7:00 AM

## 2023-07-11 NOTE — Progress Notes (Signed)
Patient c/o 10/10 mid chest pain, squeezing radiating to back, ntg .4 SL q 15 min. x3  given, VSS, pt states that she was sweating and then became nauseated and dry heaving, Zofran 4 mg IV given.  Dr. Lendell Caprice paged.  EKG done, critical result, prolonged QTC.  Rapid response paged and at bedside.  Dr. Lendell Caprice made aware of EKG.  Pt pain free by midnight.

## 2023-07-12 DIAGNOSIS — I214 Non-ST elevation (NSTEMI) myocardial infarction: Secondary | ICD-10-CM | POA: Diagnosis not present

## 2023-07-12 LAB — CBC
HCT: 40.4 % (ref 36.0–46.0)
Hemoglobin: 13.1 g/dL (ref 12.0–15.0)
MCH: 30.6 pg (ref 26.0–34.0)
MCHC: 32.4 g/dL (ref 30.0–36.0)
MCV: 94.4 fL (ref 80.0–100.0)
Platelets: 244 10*3/uL (ref 150–400)
RBC: 4.28 MIL/uL (ref 3.87–5.11)
RDW: 13.3 % (ref 11.5–15.5)
WBC: 4.9 10*3/uL (ref 4.0–10.5)
nRBC: 0 % (ref 0.0–0.2)

## 2023-07-12 LAB — MAGNESIUM: Magnesium: 2.1 mg/dL (ref 1.7–2.4)

## 2023-07-12 LAB — COMPREHENSIVE METABOLIC PANEL
ALT: 7 U/L (ref 0–44)
AST: 18 U/L (ref 15–41)
Albumin: 2.7 g/dL — ABNORMAL LOW (ref 3.5–5.0)
Alkaline Phosphatase: 32 U/L — ABNORMAL LOW (ref 38–126)
Anion gap: 9 (ref 5–15)
BUN: 14 mg/dL (ref 8–23)
CO2: 26 mmol/L (ref 22–32)
Calcium: 8.4 mg/dL — ABNORMAL LOW (ref 8.9–10.3)
Chloride: 102 mmol/L (ref 98–111)
Creatinine, Ser: 1.23 mg/dL — ABNORMAL HIGH (ref 0.44–1.00)
GFR, Estimated: 44 mL/min — ABNORMAL LOW (ref >60)
Glucose, Bld: 111 mg/dL — ABNORMAL HIGH (ref 70–99)
Potassium: 3.7 mmol/L (ref 3.5–5.1)
Sodium: 137 mmol/L (ref 135–145)
Total Bilirubin: 0.9 mg/dL (ref 0.0–1.2)
Total Protein: 5.9 g/dL — ABNORMAL LOW (ref 6.5–8.1)

## 2023-07-12 LAB — C-REACTIVE PROTEIN: CRP: 3.2 mg/dL — ABNORMAL HIGH (ref <1.0)

## 2023-07-12 LAB — SEDIMENTATION RATE: Sed Rate: 34 mm/h — ABNORMAL HIGH (ref 0–22)

## 2023-07-12 LAB — LIPOPROTEIN A (LPA): Lipoprotein (a): <8.4 nmol/L (ref <75.0)

## 2023-07-12 MED ORDER — COLCHICINE 0.6 MG PO TABS
0.6000 mg | ORAL_TABLET | Freq: Every day | ORAL | Status: DC
  Administered 2023-07-12 – 2023-07-15 (×4): 0.6 mg via ORAL
  Filled 2023-07-12 (×4): qty 1

## 2023-07-12 MED ORDER — ISOSORBIDE MONONITRATE ER 60 MG PO TB24
90.0000 mg | ORAL_TABLET | Freq: Every day | ORAL | Status: DC
  Administered 2023-07-12 – 2023-07-15 (×4): 90 mg via ORAL
  Filled 2023-07-12 (×4): qty 1

## 2023-07-12 MED ORDER — CLONIDINE HCL 0.2 MG/24HR TD PTWK
0.2000 mg | MEDICATED_PATCH | TRANSDERMAL | Status: DC
  Administered 2023-07-12: 0.2 mg via TRANSDERMAL
  Filled 2023-07-12: qty 1

## 2023-07-12 NOTE — Plan of Care (Signed)
Patient without chest pain today.  Vitals remained stable.  Inflammatory markers up today but new meds added.[name]

## 2023-07-12 NOTE — Progress Notes (Signed)
Chaplain responded to a request to provide information about AD.Pt received all needed information and will notify this office when the form is ready for notarization. Pt and Chaplain also had a moment to talk about Pt's feelings. Pt states she feels much better than yesterday and that she relies and trusts God in her recovery. Pt asked Chaplain for prayers and we prayed together.  Oneida Alar Chaplain Resident    07/12/23 1048  Spiritual Encounters  Type of Visit Initial  Care provided to: Patient  Referral source Clinical staff  Reason for visit Advance directives  OnCall Visit No

## 2023-07-12 NOTE — Progress Notes (Addendum)
Progress Note  Patient Name: [name] Date of Encounter: 07/12/2023  Primary Cardiologist: Chilton Si, MD   Subjective   Patient seen and examined at her bedside.  Inpatient Medications    Scheduled Meds:  amLODipine  10 mg Oral Daily   And   irbesartan  300 mg Oral Daily   aspirin EC  81 mg Oral Daily   atorvastatin  80 mg Oral QHS   clopidogrel  75 mg Oral Daily   furosemide  20 mg Oral Daily   heparin  5,000 Units Subcutaneous Q8H   isosorbide mononitrate  60 mg Oral Daily   lidocaine  1 patch Transdermal Q24H   mometasone-formoterol  2 puff Inhalation BID   rOPINIRole  1 mg Oral QHS   sodium chloride flush  3 mL Intravenous Q12H   Continuous Infusions:  sodium chloride     PRN Meds: sodium chloride, acetaminophen, albuterol, guaiFENesin-dextromethorphan, ondansetron (ZOFRAN) IV, sodium chloride flush   Vital Signs    Vitals:   07/11/23 1939 07/11/23 2329 07/12/23 0313 07/12/23 0738  BP: 131/63 (!) 145/56 (!) 146/62 (!) 170/71  Pulse: 85 79 78 83  Resp: 17 20 19 20   Temp: 99 F (37.2 C) 99.1 F (37.3 C) 98.8 F (37.1 C) 98.8 F (37.1 C)  TempSrc: Oral Oral Oral Oral  SpO2: 97% 92% 94% 99%  Weight:      Height:        Intake/Output Summary (Last 24 hours) at 07/12/2023 0752 Last data filed at 07/12/2023 0314 Gross per 24 hour  Intake 706.62 ml  Output 400 ml  Net 306.62 ml   Filed Weights   07/10/23 1814  Weight: 53.6 kg    Telemetry     - Personally Reviewed  ECG     - Personally Reviewed  Physical Exam    General: Comfortable,  Head: Atraumatic, normal size  Eyes: PEERLA, EOMI  Neck: Supple, normal JVD Cardiac: Normal S1, S2; RRR; no murmurs, rubs, or gallops Lungs: Clear to auscultation bilaterally Abd: Soft, nontender, no hepatomegaly  Ext: warm, no edema Musculoskeletal: No deformities, BUE and BLE strength normal and equal Skin: Warm and dry, no rashes   Neuro: Alert and oriented to person, place, time, and  situation, CNII-XII grossly intact, no focal deficits  Psych: Normal mood and affect   Labs    Chemistry Recent Labs  Lab 07/11/23 0618 07/11/23 1208  NA 141  --   K 4.8  --   CL 103  --   CO2 29  --   GLUCOSE 119*  --   BUN 17  --   CREATININE 1.53* 1.34*  CALCIUM 8.5*  --   GFRNONAA 34* 40*  ANIONGAP 9  --      Hematology Recent Labs  Lab 07/11/23 1208  WBC 6.2  RBC 4.16  HGB 12.7  HCT 39.6  MCV 95.2  MCH 30.5  MCHC 32.1  RDW 13.6  PLT 228    Cardiac EnzymesNo results for input(s): "TROPONINI" in the last 168 hours. No results for input(s): "TROPIPOC" in the last 168 hours.   BNPNo results for input(s): "BNP", "PROBNP" in the last 168 hours.   DDimer No results for input(s): "DDIMER" in the last 168 hours.   Radiology    CARDIAC CATHETERIZATION Result Date: 07/11/2023 Images from the original result were not included. Coronary angiography 07/11/2023: LM: Normal LAD: No significant disease          Diag 1 with mid 30%  disease Lcx: No significant disease RCA: Large, dominant           Prox 50%, mid 40% disease LVEDP 18 mmHg Contrast used: 30 cc Moderate nonobstructive coronary artery disease Given flu illness, new deep T wave inversion in anterolateral leads, trop elevation without obstructive coronary artery disease, consider Takotsubo cardiomyopathy or viral myocarditis as differential diagnoses. Echocardiogram pending. Elder Negus, MD    Cardiac Studies   Prior echo  Patient Profile     83 y.o. 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  Assessment & Plan    Chest pain/NSTEMI history of coronary artery disease on coronary CTA Hypertension Hyperlipidemia Flu A CKD    She is status post LHC no indication for interventions at this time. Cont plavix and atorvastatin.  Hypertensive today -increase Imdur to 90 mg, continue Amlodipine  10 mg and Irbestartan 300mg , her clonidine patch will be switched out today.  Talking to the patient today she is telling me that her pain now she is experiencing is positional, she said when she sat up this morning to eat her breakfast she did not feel the pain at all.  Not associated with breathing at all.  We again going to apply the lidocaine patch.  But I am going to send blood work for ESR and CRP if these are elevated I will treat the patient for pericarditis.  Influenza A-supportive treatment  For questions or updates, please contact [facility] Please consult [email or web address] for contact info under Cardiology/STEMI.    Signed, Thomasene Ripple, DO  07/12/2023, 7:52 AM

## 2023-07-12 NOTE — TOC Progression Note (Signed)
Transition of Care [facility]) - Progression Note    Patient Details  Name: [name] MRN: [id number] Date of Birth: Oct 22, 1940  Transition of Care [facility]) CM/SW Contact  Harriet Masson, RN Phone Number: 07/12/2023, 3:53 PM  Clinical Narrative:    PCS-aide form faxed to [facility]   Expected Discharge Plan: Home w Home Health Services Barriers to Discharge: Continued Medical Work up  Expected Discharge Plan and Services   Discharge Planning Services: CM Consult Post Acute Care Choice: Durable Medical Equipment Living arrangements for the past 2 months: Single Family Home                                       Social Determinants of Health (SDOH) Interventions SDOH Screenings   Food Insecurity: No Food Insecurity (07/10/2023)  Housing: Low Risk  (07/10/2023)  Transportation Needs: No Transportation Needs (07/10/2023)  Utilities: Not At Risk (07/10/2023)  Alcohol Screen: Low Risk  (04/12/2023)  Depression (PHQ2-9): Medium Risk (06/18/2023)  Financial Resource Strain: Low Risk  (04/12/2023)  Physical Activity: Inactive (04/12/2023)  Social Connections: Unknown (07/10/2023)  Stress: No Stress Concern Present (04/12/2023)  Tobacco Use: Medium Risk (07/11/2023)  Health Literacy: Adequate Health Literacy (04/12/2023)    Readmission Risk Interventions     No data to display

## 2023-07-12 NOTE — Progress Notes (Signed)
Elevated CRP and ESR, will start treatment with colchicine .6mg  daily given CrCL<30. No NSAIDs with eliquis.

## 2023-07-13 DIAGNOSIS — I300 Acute nonspecific idiopathic pericarditis: Secondary | ICD-10-CM

## 2023-07-13 MED ORDER — IBUPROFEN 200 MG PO TABS
400.0000 mg | ORAL_TABLET | Freq: Two times a day (BID) | ORAL | Status: DC
  Administered 2023-07-13 – 2023-07-15 (×5): 400 mg via ORAL
  Filled 2023-07-13 (×5): qty 2

## 2023-07-13 NOTE — Plan of Care (Signed)

## 2023-07-13 NOTE — Progress Notes (Signed)
Rounding Note    Patient Name: [name] Date of Encounter: 07/13/2023  [facility] Cardiologist: Chilton Si, MD   Subjective   Ongoing chest pains  Inpatient Medications    Scheduled Meds:  amLODipine  10 mg Oral Daily   And   irbesartan  300 mg Oral Daily   aspirin EC  81 mg Oral Daily   atorvastatin  80 mg Oral QHS   cloNIDine  0.2 mg Transdermal Weekly   clopidogrel  75 mg Oral Daily   colchicine  0.6 mg Oral Daily   furosemide  20 mg Oral Daily   heparin  5,000 Units Subcutaneous Q8H   isosorbide mononitrate  90 mg Oral Daily   lidocaine  1 patch Transdermal Q24H   mometasone-formoterol  2 puff Inhalation BID   rOPINIRole  1 mg Oral QHS   sodium chloride flush  3 mL Intravenous Q12H   Continuous Infusions:  PRN Meds: acetaminophen, albuterol, guaiFENesin-dextromethorphan, ondansetron (ZOFRAN) IV, sodium chloride flush   Vital Signs    Vitals:   07/12/23 2341 07/13/23 0439 07/13/23 0849 07/13/23 1101  BP: (!) 146/65 (!) 154/78  (!) 146/65  Pulse: 80 80    Resp:    18  Temp: 98 F (36.7 C) 98.6 F (37 C)  98.4 F (36.9 C)  TempSrc: Oral Oral  Oral  SpO2: 94% 92% 92%   Weight:      Height:        Intake/Output Summary (Last 24 hours) at 07/13/2023 1421 Last data filed at 07/13/2023 1102 Gross per 24 hour  Intake --  Output 650 ml  Net -650 ml      07/10/2023    6:14 PM 06/18/2023    1:01 PM 06/11/2023    8:26 AM  Last 3 Weights  Weight (lbs) 118 lb 2.7 oz 135 lb 134 lb 3.2 oz  Weight (kg) 53.6 kg 61.236 kg 60.873 kg      Telemetry    NSR - Personally Reviewed  ECG    N/a - Personally Reviewed  Physical Exam   GEN: No acute distress.   Neck: No JVD Cardiac: RRR, no murmurs, rubs, or gallops.  Respiratory: coarse bilaterally GI: Soft, nontender, non-distended  MS: No edema; No deformity. Neuro:  Nonfocal  Psych: Normal affect   Labs    High Sensitivity Troponin:  No results for input(s): "TROPONINIHS" in  the last 720 hours.   Chemistry Recent Labs  Lab 07/11/23 0618 07/11/23 1208 07/12/23 0920  NA 141  --  137  K 4.8  --  3.7  CL 103  --  102  CO2 29  --  26  GLUCOSE 119*  --  111*  BUN 17  --  14  CREATININE 1.53* 1.34* 1.23*  CALCIUM 8.5*  --  8.4*  MG  --   --  2.1  PROT  --   --  5.9*  ALBUMIN  --   --  2.7*  AST  --   --  18  ALT  --   --  7  ALKPHOS  --   --  32*  BILITOT  --   --  0.9  GFRNONAA 34* 40* 44*  ANIONGAP 9  --  9    Lipids  Recent Labs  Lab 07/11/23 0618  CHOL 174  TRIG 45  HDL 52  LDLCALC 113*  CHOLHDL 3.3    Hematology Recent Labs  Lab 07/11/23 1208 07/12/23 0920  WBC 6.2  4.9  RBC 4.16 4.28  HGB 12.7 13.1  HCT 39.6 40.4  MCV 95.2 94.4  MCH 30.5 30.6  MCHC 32.1 32.4  RDW 13.6 13.3  PLT 228 244   Thyroid No results for input(s): "TSH", "FREET4" in the last 168 hours.  BNPNo results for input(s): "BNP", "PROBNP" in the last 168 hours.  DDimer No results for input(s): "DDIMER" in the last 168 hours.   Radiology    No results found.  Cardiac Studies     Patient Profile     [name] is a 83 y.o. 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 LVEDP 18  - patient later became positional. 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 GFR 44 With ongoing pain would try moderate dose NSAIDs, ibuprofren 400mg  bid.  - check echo evaluate LV function, if effusion is present.  - no significant ventricular arrhythmias by tele  2.Influenza - symptoms started 2/7,  started on tamiflu as outpatient  3. HTN - continue current meds  4. History of CVA - she is on plavix For questions or updates, please contact [facility] Please consult [email or web address] for contact info under        Signed, Dina Rich, MD  07/13/2023, 2:21 PM

## 2023-07-13 NOTE — Progress Notes (Signed)
Mobility Specialist Progress Note:   07/13/23 0900  Oxygen Therapy  O2 Device Room Air  Mobility  Activity Ambulated with assistance in hallway  Level of Assistance Contact guard assist, steadying assist  Assistive Device Front wheel walker  Distance Ambulated (ft) 90 ft  Activity Response Tolerated well  Mobility Referral Yes  Mobility visit 1 Mobility  Mobility Specialist Start Time (ACUTE ONLY) [id number]  Mobility Specialist Stop Time (ACUTE ONLY) 0908  Mobility Specialist Time Calculation (min) (ACUTE ONLY) 10 min    Pre Mobility: 84 HR, 96% SpO2 Post Mobility:  89 HR,  94% SpO2  Pt received in bed, eager for mobility. Required only minG throughout. Asymptomatic w/ no complaints. Pt left on EOB with call bell and all needs met. Bed alarm on.  [name] Mobility Specialist Please contact via Special educational needs teacher or Rehab office at [phone]

## 2023-07-14 ENCOUNTER — Inpatient Hospital Stay (HOSPITAL_COMMUNITY): Payer: No Typology Code available for payment source

## 2023-07-14 DIAGNOSIS — R079 Chest pain, unspecified: Secondary | ICD-10-CM

## 2023-07-14 DIAGNOSIS — I300 Acute nonspecific idiopathic pericarditis: Secondary | ICD-10-CM | POA: Diagnosis not present

## 2023-07-14 LAB — ECHOCARDIOGRAM COMPLETE
AR max vel: 3.09 cm2
AV Area VTI: 2.86 cm2
AV Area mean vel: 2.41 cm2
AV Mean grad: 4 mm[Hg]
AV Peak grad: 6 mm[Hg]
Ao pk vel: 1.22 m/s
Area-P 1/2: 4.17 cm2
Calc EF: 62.6 %
Height: 61 in
MV VTI: 3.17 cm2
S' Lateral: 2.8 cm
Single Plane A2C EF: 59 %
Single Plane A4C EF: 63.5 %
Weight: 1890.66 [oz_av]

## 2023-07-14 LAB — BASIC METABOLIC PANEL
Anion gap: 12 (ref 5–15)
BUN: 13 mg/dL (ref 8–23)
CO2: 24 mmol/L (ref 22–32)
Calcium: 8.5 mg/dL — ABNORMAL LOW (ref 8.9–10.3)
Chloride: 104 mmol/L (ref 98–111)
Creatinine, Ser: 1.21 mg/dL — ABNORMAL HIGH (ref 0.44–1.00)
GFR, Estimated: 45 mL/min — ABNORMAL LOW (ref >60)
Glucose, Bld: 106 mg/dL — ABNORMAL HIGH (ref 70–99)
Potassium: 3.6 mmol/L (ref 3.5–5.1)
Sodium: 140 mmol/L (ref 135–145)

## 2023-07-14 MED ORDER — ALUM & MAG HYDROXIDE-SIMETH 200-200-20 MG/5ML PO SUSP
30.0000 mL | Freq: Once | ORAL | Status: AC
  Administered 2023-07-14: 30 mL via ORAL
  Filled 2023-07-14: qty 30

## 2023-07-14 NOTE — Plan of Care (Signed)

## 2023-07-14 NOTE — Progress Notes (Signed)
Rounding Note    Patient Name: [name] Date of Encounter: 07/14/2023  [facility] Cardiologist: Chilton Si, MD   Subjective   Chest pain less frequent and less severe  Inpatient Medications    Scheduled Meds:  amLODipine  10 mg Oral Daily   And   irbesartan  300 mg Oral Daily   atorvastatin  80 mg Oral QHS   cloNIDine  0.2 mg Transdermal Weekly   clopidogrel  75 mg Oral Daily   colchicine  0.6 mg Oral Daily   furosemide  20 mg Oral Daily   heparin  5,000 Units Subcutaneous Q8H   ibuprofen  400 mg Oral BID   isosorbide mononitrate  90 mg Oral Daily   lidocaine  1 patch Transdermal Q24H   mometasone-formoterol  2 puff Inhalation BID   rOPINIRole  1 mg Oral QHS   sodium chloride flush  3 mL Intravenous Q12H   Continuous Infusions:  PRN Meds: acetaminophen, albuterol, guaiFENesin-dextromethorphan, ondansetron (ZOFRAN) IV, sodium chloride flush   Vital Signs    Vitals:   07/13/23 2003 07/13/23 2338 07/14/23 0440 07/14/23 0814  BP: (!) 149/65 129/66    Pulse: 82 90    Resp: 19 20    Temp: 99.5 F (37.5 C) 98.4 F (36.9 C)    TempSrc: Oral Oral    SpO2: 93% 95% 92% 96%  Weight:      Height:        Intake/Output Summary (Last 24 hours) at 07/14/2023 4132 Last data filed at 07/13/2023 2338 Gross per 24 hour  Intake 360 ml  Output 600 ml  Net -240 ml      07/10/2023    6:14 PM 06/18/2023    1:01 PM 06/11/2023    8:26 AM  Last 3 Weights  Weight (lbs) 118 lb 2.7 oz 135 lb 134 lb 3.2 oz  Weight (kg) 53.6 kg 61.236 kg 60.873 kg      Telemetry    NSR - Personally Reviewed  ECG    N/a - Personally Reviewed  Physical Exam   GEN: No acute distress.   Neck: No JVD Cardiac: RRR, no murmurs, rubs, or gallops.  Respiratory: Clear to auscultation bilaterally. GI: Soft, nontender, non-distended  MS: No edema; No deformity. Neuro:  Nonfocal  Psych: Normal affect   Labs    High Sensitivity Troponin:  No results for input(s):  "TROPONINIHS" in the last 720 hours.   Chemistry Recent Labs  Lab 07/11/23 0618 07/11/23 1208 07/12/23 0920 07/14/23 0154  NA 141  --  137 140  K 4.8  --  3.7 3.6  CL 103  --  102 104  CO2 29  --  26 24  GLUCOSE 119*  --  111* 106*  BUN 17  --  14 13  CREATININE 1.53* 1.34* 1.23* 1.21*  CALCIUM 8.5*  --  8.4* 8.5*  MG  --   --  2.1  --   PROT  --   --  5.9*  --   ALBUMIN  --   --  2.7*  --   AST  --   --  18  --   ALT  --   --  7  --   ALKPHOS  --   --  32*  --   BILITOT  --   --  0.9  --   GFRNONAA 34* 40* 44* 45*  ANIONGAP 9  --  9 12    Lipids  Recent Labs  Lab 07/11/23 0618  CHOL 174  TRIG 45  HDL 52  LDLCALC 113*  CHOLHDL 3.3    Hematology Recent Labs  Lab 07/11/23 1208 07/12/23 0920  WBC 6.2 4.9  RBC 4.16 4.28  HGB 12.7 13.1  HCT 39.6 40.4  MCV 95.2 94.4  MCH 30.5 30.6  MCHC 32.1 32.4  RDW 13.6 13.3  PLT 228 244   Thyroid No results for input(s): "TSH", "FREET4" in the last 168 hours.  BNPNo results for input(s): "BNP", "PROBNP" in the last 168 hours.  DDimer No results for input(s): "DDIMER" in the last 168 hours.   Radiology    No results found.  Cardiac Studies     Patient Profile     [name] is a 83 y.o. 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 LVEDP 18   - elevated inflammatory markers. ESR 34, CRP 3.2. 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 GFR 44 With ongoing pain we did start lower dose ibuprofren 400mg  bid. Renal function is stable - no significant ventricular arrhythmias by tele or clinical signs of HF  - pain improving but not resolved, monitor today perhaps discharge tomorrow/ F/u echo results.    2.Influenza - symptoms started 2/7, managed with tamiflu as outpatient   3. HTN - continue current meds   4. History of CVA - she is on plavix     For questions or updates, please contact [facility] Please consult [email or web address] for contact info under        Signed, Dina Rich, MD  07/14/2023, 9:22 AM

## 2023-07-14 NOTE — Progress Notes (Signed)
Mobility Specialist Progress Note:    07/14/23 1546  Mobility  Activity Ambulated with assistance in hallway  Level of Assistance Contact guard assist, steadying assist  Assistive Device Front wheel walker  Distance Ambulated (ft) 90 ft  Activity Response Tolerated well  Mobility Referral Yes  Mobility visit 1 Mobility  Mobility Specialist Start Time (ACUTE ONLY) 1409  Mobility Specialist Stop Time (ACUTE ONLY) 1417  Mobility Specialist Time Calculation (min) (ACUTE ONLY) 8 min   Pt received in bed and eager to mobility. No complaints throughout. Returned to room w/o fault. Pt left on EOB with call bell and all needs met.  D'Vante Earlene Plater Mobility Specialist Please contact via Special educational needs teacher or Rehab office at [phone]

## 2023-07-14 NOTE — TOC Progression Note (Signed)
Transition of Care [facility]) - Progression Note    Patient Details  Name: [name] MRN: [id number] Date of Birth: 01-Feb-1941  Transition of Care [facility]) CM/SW Contact  Ronny Bacon, RN Phone Number: 07/14/2023, 8:31 AM  Clinical Narrative:   Secure chat received from floor nurse regarding patient needing transportation assistance home at discharge. Spoke with patient by phone, reports that she arrived here by ambulance from [facility] and there is no one to pick her up at discharge. Taxi voucher given to Licensed conveyancer to use at time of discharge.    Expected Discharge Plan: Home w Home Health Services Barriers to Discharge: Continued Medical Work up  Expected Discharge Plan and Services   Discharge Planning Services: CM Consult Post Acute Care Choice: Durable Medical Equipment Living arrangements for the past 2 months: Single Family Home                                       Social Determinants of Health (SDOH) Interventions SDOH Screenings   Food Insecurity: No Food Insecurity (07/10/2023)  Housing: Low Risk  (07/10/2023)  Transportation Needs: No Transportation Needs (07/10/2023)  Utilities: Not At Risk (07/10/2023)  Alcohol Screen: Low Risk  (04/12/2023)  Depression (PHQ2-9): Medium Risk (06/18/2023)  Financial Resource Strain: Low Risk  (04/12/2023)  Physical Activity: Inactive (04/12/2023)  Social Connections: Unknown (07/10/2023)  Stress: No Stress Concern Present (04/12/2023)  Tobacco Use: Medium Risk (07/11/2023)  Health Literacy: Adequate Health Literacy (04/12/2023)    Readmission Risk Interventions     No data to display

## 2023-07-14 NOTE — Progress Notes (Signed)
Orders written to tx to tele bed per Dr. Verna Czech request.

## 2023-07-15 ENCOUNTER — Other Ambulatory Visit (HOSPITAL_COMMUNITY): Payer: Self-pay

## 2023-07-15 ENCOUNTER — Encounter: Payer: Self-pay | Admitting: *Deleted

## 2023-07-15 ENCOUNTER — Ambulatory Visit: Payer: Self-pay | Admitting: *Deleted

## 2023-07-15 DIAGNOSIS — I300 Acute nonspecific idiopathic pericarditis: Secondary | ICD-10-CM

## 2023-07-15 DIAGNOSIS — J101 Influenza due to other identified influenza virus with other respiratory manifestations: Secondary | ICD-10-CM

## 2023-07-15 DIAGNOSIS — I10 Essential (primary) hypertension: Secondary | ICD-10-CM

## 2023-07-15 DIAGNOSIS — R931 Abnormal findings on diagnostic imaging of heart and coronary circulation: Secondary | ICD-10-CM

## 2023-07-15 DIAGNOSIS — Z8673 Personal history of transient ischemic attack (TIA), and cerebral infarction without residual deficits: Secondary | ICD-10-CM

## 2023-07-15 DIAGNOSIS — I214 Non-ST elevation (NSTEMI) myocardial infarction: Secondary | ICD-10-CM | POA: Diagnosis not present

## 2023-07-15 HISTORY — DX: Acute nonspecific idiopathic pericarditis: I30.0

## 2023-07-15 HISTORY — DX: Abnormal findings on diagnostic imaging of heart and coronary circulation: R93.1

## 2023-07-15 HISTORY — DX: Influenza due to other identified influenza virus with other respiratory manifestations: J10.1

## 2023-07-15 LAB — BASIC METABOLIC PANEL
Anion gap: 8 (ref 5–15)
BUN: 13 mg/dL (ref 8–23)
CO2: 26 mmol/L (ref 22–32)
Calcium: 8.4 mg/dL — ABNORMAL LOW (ref 8.9–10.3)
Chloride: 105 mmol/L (ref 98–111)
Creatinine, Ser: 1.35 mg/dL — ABNORMAL HIGH (ref 0.44–1.00)
GFR, Estimated: 40 mL/min — ABNORMAL LOW (ref >60)
Glucose, Bld: 106 mg/dL — ABNORMAL HIGH (ref 70–99)
Potassium: 3.8 mmol/L (ref 3.5–5.1)
Sodium: 139 mmol/L (ref 135–145)

## 2023-07-15 MED ORDER — IBUPROFEN 400 MG PO TABS
400.0000 mg | ORAL_TABLET | Freq: Two times a day (BID) | ORAL | 0 refills | Status: DC
  Filled 2023-07-15: qty 28, 14d supply, fill #0

## 2023-07-15 MED ORDER — COLCHICINE 0.6 MG PO TABS
0.6000 mg | ORAL_TABLET | Freq: Every day | ORAL | 0 refills | Status: DC
  Filled 2023-07-15: qty 90, 90d supply, fill #0

## 2023-07-15 MED ORDER — IBUPROFEN 200 MG PO TABS
200.0000 mg | ORAL_TABLET | Freq: Two times a day (BID) | ORAL | 0 refills | Status: DC
Start: 2023-07-30 — End: 2023-08-06
  Filled 2023-07-15: qty 14, 7d supply, fill #0

## 2023-07-15 MED ORDER — PANTOPRAZOLE SODIUM 40 MG PO TBEC
40.0000 mg | DELAYED_RELEASE_TABLET | Freq: Every day | ORAL | 0 refills | Status: DC
  Filled 2023-07-15: qty 30, 30d supply, fill #0

## 2023-07-15 NOTE — Progress Notes (Signed)
Mobility Specialist Progress Note;   07/15/23 0948  Mobility  Activity Ambulated with assistance in hallway  Level of Assistance Contact guard assist, steadying assist  Assistive Device Front wheel walker  Distance Ambulated (ft) 200 ft  Activity Response Tolerated well  Mobility Referral Yes  Mobility visit 1 Mobility  Mobility Specialist Start Time (ACUTE ONLY) [id number]  Mobility Specialist Stop Time (ACUTE ONLY) 0957  Mobility Specialist Time Calculation (min) (ACUTE ONLY) 9 min   Pt eager for mobility. Required MinG assistance during ambulation for safety. VSS throughout and no c/o during session. Pt returned back to bed with all needs met.   Caesar Bookman Mobility Specialist Please contact via SecureChat or [employer] [phone]

## 2023-07-15 NOTE — Progress Notes (Signed)
   07/15/23 1330  Spiritual Encounters  Type of Visit Follow up  Care provided to: Patient  Conversation partners present during encounter Nurse  Reason for visit Advance directives   Chaplain responding to a call fro unit secretary that Pt had completed Advance Care Directive Paperwork and wanted to have it notarized.  Upon arriving in the room, the Pt was being discharged and RN was taking her to discharge lounge.  Chaplain inspected paperwork and determined it was ready to go.  Chaplain called notary and witnesses to meet Pt in the discharge lounge and notarized the paperwork.  Chaplain scanned and emailed the paperwork, and made 2 copies to give to Pt along with the original.  Chaplain services remain available by Spiritual Consult or for emergent cases, paging [phone]  Chaplain Raelene Bott, MDiv [email or web address] [phone]

## 2023-07-15 NOTE — Patient Instructions (Addendum)
Visit Information  Thank you for taking time to visit with me today. Please don't hesitate to contact me if I can be of assistance to you.   Following are the goals we discussed today:   Goals Addressed               This Visit's Progress     Receive Assistance Obtaining Personal Care Services. (pt-stated)   On track     Care Coordination Interventions:  Interventions Today    Flowsheet Row Most Recent Value  Chronic Disease   Chronic disease during today's visit Chronic Obstructive Pulmonary Disease (COPD), Hypertension (HTN), Chronic Kidney Disease/End Stage Renal Disease (ESRD), Other  [Inability to Perform Activities of Daily Living Independently, Recurrent Depression, Cigarette Nicotine Dependence, Generalized Anxiety Disorder & History of Transient Ischemic Attack, Frequent Falls, Currently Hospitalized.]  General Interventions   General Interventions Discussed/Reviewed General Interventions Discussed, General Interventions Reviewed, Doctor Visits, Communication with, [employer], Level of Care  [Encouraged Routine Engagement with Care Team Members & Providers.]  Doctor Visits Discussed/Reviewed Doctor Visits Discussed, Specialist, Doctor Visits Reviewed, Annual Wellness Visits, PCP  [Encouraged Routine Engagement with Care Team Members & Providers.]  PCP/Specialist Visits Compliance with follow-up visit  [Encouraged Routine Engagement with Care Team Members & Providers.]  Communication with PCP/Specialists, Charity fundraiser, Pharmacists, Social Work  [employer] Routine Engagement with Care Team Members & Providers.]  Level of Care Adult Daycare, Assisted Living, Skilled Nursing Facility  [Confirmed Disinterest in Enrollment in Adult Day Care Program. Confirmed Disinterest in Pursuing Higher Level of Care Placement Options.]  Applications Medicaid, Personal Care Services  [Confirmed Disinterest in Applying for Medicaid or Personal Care Services.]  Exercise Interventions   Exercise  Discussed/Reviewed Exercise Discussed, Exercise Reviewed, Physical Activity, Weight Managment, Assistive device use and maintanence  [Encouraged Routine Use of Assistive Devices & Durable Medical Equipment.]  Physical Activity Discussed/Reviewed Physical Activity Discussed, Physical Activity Reviewed, Types of exercise, Home Exercise Program (HEP), PREP, Gym  [Encouraged Daily Exercise Regimen, as Tolerated.]  Education Interventions   Education Provided Provided Education  [employer] Reviewed Educational Material & Encouraged Consideration & Implementation.]  Provided Verbal Education On When to see the doctor, Mental Health/Coping with Illness, Programmer, applications, Exercise  [Encouraged Routine Engagement with Care Team Members & Providers.]  Ship broker, Personal Care Services  [Confirmed Disinterest in Applying for Medicaid or Personal Care Services.]  Mental Health Interventions   Mental Health Discussed/Reviewed Mental Health Discussed, Anxiety, Depression, Grief and Loss, Mental Health Reviewed, Substance Abuse, Coping Strategies, Suicide, Crisis, Other  [Assessed Mental Health & Cognitive Status.]  Safety Interventions   Safety Discussed/Reviewed Safety Discussed, Safety Reviewed, Fall Risk, Home Safety  [Encouraged Consideration of Obtaining a Teaching laboratory technician for Home Use.]  Home Safety Assistive Devices, Need for home safety assessment, Contact provider for referral to PT/OT, Contact home health agency, Refer for community resources, Refer for home visit  [Encouraged Consideration of Home Health Physicial & Occupational Therapy Services.]  Advanced Directive Interventions   Advanced Directives Discussed/Reviewed Advanced Directives Discussed, Advanced Directives Reviewed  [Encouraged Initiation of Advanced Directives (Living Will & Healthcare Power of Corporate treasurer), Offering to [employer], Assist with Completion, Make Copies, & Scan into Electronic Medical Record in Epic.]       Active Listening & Reflection Utilized.  Verbalization of Feelings Encouraged.  Emotional Support Provided. Cognitive Behavioral Therapy Implemented. Acceptance & Commitment Therapy Indicated. Client-Centered Therapy Initiated. Encouraged Routine Engagement with [name], Licensed Clinical Social Worker with [facility], Population Health (#  [phone]), if You Have Questions, Need Assistance, or If Additional Social Work Needs Are Identified Between Now & Our Next Follow-Up Outreach Call, Scheduled on 08/15/2023 at 10:00 AM.      Our next appointment is by telephone on 08/15/2023 at 10:00 am.  Please call the care guide team at [phone] if you need to cancel or reschedule your appointment.   If you are experiencing a Mental Health or Behavioral Health Crisis or need someone to talk to, please call the Suicide and Crisis Lifeline: 988 call the [place] National Suicide Prevention Lifeline: 726-810-8078 or TTY: 209-066-5602 TTY (870)648-6958) to talk to a trained counselor call 1-800-273-TALK (toll free, 24 hour hotline) go to [facility] [address] [place] [phone]) call the [facility] Crisis Line: [phone] call 911  Patient verbalizes understanding of instructions and care plan provided today and agrees to view in MyChart. Active MyChart status and patient understanding of how to access instructions and care plan via MyChart confirmed with patient.     Telephone follow up appointment with care management team member scheduled for:  08/15/2023 at 10:00 am.   [name], BSW, MSW, LCSW [place]  [facility], [facility] Clinical Social Worker II Direct Dial: [phone]  Fax: [phone] Website: [email or web address]

## 2023-07-15 NOTE — TOC Transition Note (Signed)
Transition of Care [facility]) - Discharge Note   Patient Details  Name: [name] MRN: [id number] Date of Birth: [birth year]  Transition of Care [facility]) CM/SW Contact:  Harriet Masson, RN Phone Number: 07/15/2023, 12:12 PM   Clinical Narrative:    Patient stable for discharge.  Walker ordered. Notified Zack with adapt of DME needs. No other TOC needs.    Final next level of care: Home/Self Care Barriers to Discharge: Barriers Resolved   Patient Goals and CMS Choice Patient states their goals for this hospitalization and ongoing recovery are:: return home          Discharge Placement             home          Discharge Plan and Services Additional resources added to the After Visit Summary for     Discharge Planning Services: CM Consult Post Acute Care Choice: Durable Medical Equipment                               Social Drivers of Health (SDOH) Interventions SDOH Screenings   Food Insecurity: No Food Insecurity (07/15/2023)  Housing: Low Risk  (07/15/2023)  Transportation Needs: No Transportation Needs (07/15/2023)  Utilities: Not At Risk (07/15/2023)  Alcohol Screen: Low Risk  (07/15/2023)  Depression (PHQ2-9): Low Risk  (07/15/2023)  Recent Concern: Depression (PHQ2-9) - Medium Risk (06/18/2023)  Financial Resource Strain: Low Risk  (07/15/2023)  Physical Activity: Inactive (07/15/2023)  Social Connections: Moderately Integrated (07/15/2023)  Stress: No Stress Concern Present (07/15/2023)  Tobacco Use: Medium Risk (07/15/2023)  Health Literacy: Adequate Health Literacy (07/15/2023)     Readmission Risk Interventions    07/15/2023   12:11 PM  Readmission Risk Prevention Plan  Post Dischage Appt Complete  Medication Screening Complete  Transportation Screening Complete

## 2023-07-15 NOTE — Plan of Care (Signed)

## 2023-07-15 NOTE — Discharge Instructions (Addendum)
Post Cath Instructions: No driving for 1 week. No lifting over 10 lbs for 2 weeks. No sexual activity for 2 weeks. Keep procedure site clean & dry. If you notice increased pain, swelling, bleeding or pus, call/return!  You may shower, but no soaking baths/hot tubs/pools for 1 week.

## 2023-07-15 NOTE — Patient Outreach (Addendum)
Care Coordination   Follow Up Visit Note   07/15/2023  Name: [name] MRN: [id number] DOB: 02-13-1941  [name] is a 83 y.o. year old female who sees Stacks, Broadus John, MD for primary care. I spoke with [name] by phone today.  What matters to the patients health and wellness today?  Receive Assistance Obtaining Personal Care Services.    Goals Addressed               This Visit's Progress     Receive Assistance Obtaining Personal Care Services. (pt-stated)   On track     Care Coordination Interventions:  Interventions Today    Flowsheet Row Most Recent Value  Chronic Disease   Chronic disease during today's visit Chronic Obstructive Pulmonary Disease (COPD), Hypertension (HTN), Chronic Kidney Disease/End Stage Renal Disease (ESRD), Other  [Inability to Perform Activities of Daily Living Independently, Recurrent Depression, Cigarette Nicotine Dependence, Generalized Anxiety Disorder & History of Transient Ischemic Attack, Frequent Falls, Currently Hospitalized.]  General Interventions   General Interventions Discussed/Reviewed General Interventions Discussed, General Interventions Reviewed, Doctor Visits, Communication with, [employer], Level of Care  [Encouraged Routine Engagement with Care Team Members & Providers.]  Doctor Visits Discussed/Reviewed Doctor Visits Discussed, Specialist, Doctor Visits Reviewed, Annual Wellness Visits, PCP  [Encouraged Routine Engagement with Care Team Members & Providers.]  PCP/Specialist Visits Compliance with follow-up visit  [Encouraged Routine Engagement with Care Team Members & Providers.]  Communication with PCP/Specialists, Charity fundraiser, Pharmacists, Social Work  [employer] Routine Engagement with Care Team Members & Providers.]  Level of Care Adult Daycare, Assisted Living, Skilled Nursing Facility  [Confirmed Disinterest in Enrollment in Adult Day Care Program. Confirmed Disinterest in Pursuing Higher Level of Care  Placement Options.]  Applications Medicaid, Personal Care Services  [Confirmed Disinterest in Applying for Medicaid or Personal Care Services.]  Exercise Interventions   Exercise Discussed/Reviewed Exercise Discussed, Exercise Reviewed, Physical Activity, Weight Managment, Assistive device use and maintanence  [Encouraged Routine Use of Assistive Devices & Durable Medical Equipment.]  Physical Activity Discussed/Reviewed Physical Activity Discussed, Physical Activity Reviewed, Types of exercise, Home Exercise Program (HEP), PREP, Gym  [Encouraged Daily Exercise Regimen, as Tolerated.]  Education Interventions   Education Provided Provided Education  [employer] Reviewed Educational Material & Encouraged Consideration & Implementation.]  Provided Verbal Education On When to see the doctor, Mental Health/Coping with Illness, Programmer, applications, Exercise  [Encouraged Routine Engagement with Care Team Members & Providers.]  Ship broker, Personal Care Services  [Confirmed Disinterest in Applying for Medicaid or Personal Care Services.]  Mental Health Interventions   Mental Health Discussed/Reviewed Mental Health Discussed, Anxiety, Depression, Grief and Loss, Mental Health Reviewed, Substance Abuse, Coping Strategies, Suicide, Crisis, Other  [Assessed Mental Health & Cognitive Status.]  Safety Interventions   Safety Discussed/Reviewed Safety Discussed, Safety Reviewed, Fall Risk, Home Safety  [Encouraged Consideration of Obtaining a Teaching laboratory technician for Home Use.]  Home Safety Assistive Devices, Need for home safety assessment, Contact provider for referral to PT/OT, Contact home health agency, Refer for community resources, Refer for home visit  [Encouraged Consideration of Home Health Physicial & Occupational Therapy Services.]  Advanced Directive Interventions   Advanced Directives Discussed/Reviewed Advanced Directives Discussed, Advanced Directives Reviewed  [Encouraged Initiation of  Advanced Directives (Living Will & Healthcare Power of Corporate treasurer), Offering to [employer], Assist with Completion, Make Copies, & Scan into Electronic Medical Record in Epic.]      Active Listening & Reflection Utilized.  Verbalization of Feelings Encouraged.  Emotional Support Provided. Cognitive  Behavioral Therapy Implemented. Acceptance & Commitment Therapy Indicated. Client-Centered Therapy Initiated. Encouraged Routine Engagement with [name], Licensed Clinical Social Worker with [facility], [facility] [phone]), if You Have Questions, Need Assistance, or If Additional Social Work Needs Are Identified Between Now & Our Next Follow-Up Outreach Call, Scheduled on 08/15/2023 at 10:00 AM.        SDOH assessments and interventions completed:  Yes.  SDOH Interventions Today    Flowsheet Row Most Recent Value  SDOH Interventions   Food Insecurity Interventions Intervention Not Indicated  Housing Interventions Intervention Not Indicated  Transportation Interventions Intervention Not Indicated, Community Resources Provided, Patient Resources (Friends/Family), Payor Benefit  Utilities Interventions Intervention Not Indicated  Alcohol Usage Interventions Intervention Not Indicated (Score <7)  Depression Interventions/Treatment  Medication, Counseling, Currently on Treatment, Radiation protection practitioner Strain Interventions Intervention Not Indicated  Physical Activity Interventions Community Resources Provided, Patient Declined  Stress Interventions Intervention Not Indicated, Programmer, applications Provided, [employer], Provide Counseling  Social Connections Interventions Intervention Not Indicated, Community Resources Provided  Health Literacy Interventions Intervention Not Indicated     Care Coordination Interventions:  Yes, provided.   Follow up plan: Follow up call scheduled for  08/15/2023 at 10:00 am.  Encounter Outcome:  Patient Visit Completed.    [name], BSW, MSW, LCSW [facility], [facility] Clinical Social Worker II Direct Dial: [phone]  Fax: [phone] Website: [email or web address]

## 2023-07-15 NOTE — Discharge Summary (Addendum)
Discharge Summary    Patient ID: [name] MRN: [id number]; DOB: 02/06/1941  Admit date: 07/10/2023 Discharge date: 07/15/2023  PCP:  Mechele Claude, MD   [facility] Providers Cardiologist:  Chilton Si, MD        Discharge Diagnoses    Principal Problem:   NSTEMI (non-ST elevated myocardial infarction) [facility]) Pericarditis Influenza A Moderate coronary artery calcification Benign essential hypertension. History of CVA   Diagnostic Studies/Procedures    Left heart catheterization 07/11/2023 Echocardiogram 07/14/2023  History of Present Illness     [name] is a 83 y.o. 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    Hospital Course    Patient started having flulike symptoms on 07/05/2023 which included cough, chills, body aches, and shortness of breath.  The symptoms initially got better however worsened by 07/10/2023.  She woke up that day with worsening shortness of breath.  EMS was called and patient was brought to the hospital at [facility] for further evaluation.  Patient was noted to have elevated blood pressures, serial troponins were trending up, and proBNP was 637.  She was transferred to [facility] due to elevated troponins.  Given the patient's symptoms, EKG findings, and risk factors patient did undergo left heart catheterization on 07/11/2023.  She was noted to have nonobstructive disease.  She continues to have precordial discomfort with positional attributes as well as chronic cough.  Inflammatory markers were checked and ESR and CRP were elevated.  Patient was started on colchicine and NSAIDs and since then precordial discomfort has resolved but still has some atypical chest pain with coughing episodes.  This morning she denies anginal chest pain.  No heart  failure symptoms.  And wishes to go home.   Impression and plan    Precordial pain/NSTEMI/acute pericarditis/moderate coronary calcification Given her symptoms of precordial pain, EKG changes, and cardiac biomarkers concerns for NSTEMI on arrival.  Patient did undergo left heart catheterization and was noted to have nonobstructive CAD.  She continued to have precordial discomfort and inflammatory markers were checked.  ESR and CRP were elevated  Patient started on colchicine and NSAIDs.  Her precordial discomfort has improved significantly.  Currently on ibuprofen 400 mg p.o. twice daily, continue for 2 weeks. Ibuprofen 200 mg p.o. twice daily for additional 1 weeks. Will start PPI for total of 4 weeks while she is on NSAIDs. Continue colchicine 0.6 mg daily for 3 months  Follow-up as outpatient in 4 weeks when she is done with NSAIDs to reevaluate her symptoms and recheck ESR and CRP.  No driving for 1 week. No lifting over 10 lbs for 2 weeks. No sexual activity for 2 weeks. Keep procedure site clean & dry. If you notice increased pain, swelling, bleeding or pus, call/return! You may shower, but no soaking baths/hot tubs/pools for 1 week.   Influenza A: Symptoms started on 07/05/2023.  Will follow-up with me.  Hypertension:  Continue amlodipine/olmesartan 10/40 mg p.o. daily. Continue clonidine transdermal patch. Continue Imdur 60 mg p.o. daily  History of CVA: Continue Plavix.  Reemphasized the importance of secondary prevention with focus on improving her modifiable cardiovascular risk factors such as glycemic control, lipid management, blood pressure control, weight loss.   Consultants: None  Did the patient have an acute coronary syndrome (MI, NSTEMI, STEMI, etc) this admission?:  Yes  AHA/ACC ACS Clinical Performance & Quality Measures: Aspirin prescribed? - No - On Motirn for pericarditis and Plavix.  ADP Receptor Inhibitor (Plavix/Clopidogrel,  Brilinta/Ticagrelor or Effient/Prasugrel) prescribed (includes medically managed patients)? - Yes Beta Blocker prescribed? - No. The patient has an EF >/= 50%. Based upon the [facility] Study pub in Apr 2024, there is no benefit in patients with preserved EF post MI. High Intensity Statin (Lipitor 40-80mg  or Crestor 20-40mg ) prescribed? - Yes EF assessed during THIS hospitalization? - Yes For EF <40%, was ACEI/ARB prescribed? - Not Applicable (EF >/= 40%) For EF <40%, Aldosterone Antagonist (Spironolactone or Eplerenone) prescribed? - Not Applicable (EF >/= 40%) Cardiac Rehab Phase II ordered (including medically managed patients)? - Yes    Discharge Vitals Blood pressure 138/86, pulse 86, temperature 97.9 F (36.6 C), temperature source Oral, resp. rate 16, height 5\' 1"  (1.549 m), weight 53.6 kg, SpO2 93%.  Filed Weights   07/10/23 1814  Weight: 53.6 kg   Physical Exam  Constitutional:  Age appropriate, hemodynamically stable, no acute distress.   Neck: No JVD present.  Cardiovascular: Normal rate, regular rhythm, S1 normal and S2 normal. Exam reveals no gallop and no friction rub.  No murmur heard. Pulmonary/Chest: Breath sounds normal. She has no wheezes. She has no rales. She exhibits no tenderness.  Abdominal: Soft. Bowel sounds are normal. She exhibits no distension. There is no abdominal tenderness.  Musculoskeletal:        General: No tenderness or edema.  Neurological: She is alert and oriented to person, place, and time.  Skin: Skin is warm and dry.   Labs & Radiologic Studies    CBC No results for input(s): "WBC", "NEUTROABS", "HGB", "HCT", "MCV", "PLT" in the last 72 hours. Basic Metabolic Panel Recent Labs    16/10/96 0154 07/15/23 0209  NA 140 139  K 3.6 3.8  CL 104 105  CO2 24 26  GLUCOSE 106* 106*  BUN 13 13  CREATININE 1.21* 1.35*  CALCIUM 8.5* 8.4*   Liver Function Tests No results for input(s): "AST", "ALT", "ALKPHOS", "BILITOT", "PROT", "ALBUMIN" in  the last 72 hours. No results for input(s): "LIPASE", "AMYLASE" in the last 72 hours. High Sensitivity Troponin:   No results for input(s): "TROPONINIHS" in the last 720 hours.  BNP Invalid input(s): "POCBNP" D-Dimer No results for input(s): "DDIMER" in the last 72 hours. Hemoglobin A1C No results for input(s): "HGBA1C" in the last 72 hours. Fasting Lipid Panel No results for input(s): "CHOL", "HDL", "LDLCALC", "TRIG", "CHOLHDL", "LDLDIRECT" in the last 72 hours. Thyroid Function Tests No results for input(s): "TSH", "T4TOTAL", "T3FREE", "THYROIDAB" in the last 72 hours.  Invalid input(s): "FREET3" _____________  ECHOCARDIOGRAM COMPLETE Result Date: 07/14/2023    ECHOCARDIOGRAM REPORT   Patient Name:   LAQUIDA COTRELL Date of Exam: 07/14/2023 Medical Rec #:  [id number]        Height:       61.0 in Accession #:    [id number]       Weight:       118.2 lb Date of Birth:  11-27-1940         BSA:          1.510 m Patient Age:    82 years         BP:           129/66 mmHg Patient Gender: F                HR:  75 bpm. Exam Location:  Inpatient Procedure: 2D Echo, Color Doppler and Cardiac Doppler (Both Spectral and Color            Flow Doppler were utilized during procedure). Indications:    Chest Pain  History:        Patient has prior history of Echocardiogram examinations, most                 recent 09/17/2022. PAD and COPD; Risk Factors:Hypertension.  Sonographer:    Amy Chionchio Referring Phys: [id number] Dorothe Pea BRANCH IMPRESSIONS  1. Left ventricular ejection fraction, by estimation, is 55 to 60%. The left ventricle has normal function. The left ventricle has no regional wall motion abnormalities. There is mild left ventricular hypertrophy. Left ventricular diastolic parameters are consistent with Grade I diastolic dysfunction (impaired relaxation).  2. Right ventricular systolic function is normal. The right ventricular size is normal.  3. The mitral valve is normal in structure. No  evidence of mitral valve regurgitation. No evidence of mitral stenosis.  4. The aortic valve is tricuspid. Aortic valve regurgitation is not visualized. No aortic stenosis is present.  5. The inferior vena cava is normal in size with greater than 50% respiratory variability, suggesting right atrial pressure of 3 mmHg. FINDINGS  Left Ventricle: Left ventricular ejection fraction, by estimation, is 55 to 60%. The left ventricle has normal function. The left ventricle has no regional wall motion abnormalities. Strain imaging was not performed. The left ventricular internal cavity  size was normal in size. There is mild left ventricular hypertrophy. Left ventricular diastolic parameters are consistent with Grade I diastolic dysfunction (impaired relaxation). Normal left ventricular filling pressure. Right Ventricle: The right ventricular size is normal. Right vetricular wall thickness was not well visualized. Right ventricular systolic function is normal. Left Atrium: Left atrial size was normal in size. Right Atrium: Right atrial size was normal in size. Pericardium: There is no evidence of pericardial effusion. Mitral Valve: The mitral valve is normal in structure. No evidence of mitral valve regurgitation. No evidence of mitral valve stenosis. MV peak gradient, 5.3 mmHg. The mean mitral valve gradient is 2.0 mmHg. Tricuspid Valve: The tricuspid valve is normal in structure. Tricuspid valve regurgitation is not demonstrated. No evidence of tricuspid stenosis. Aortic Valve: The aortic valve is tricuspid. Aortic valve regurgitation is not visualized. No aortic stenosis is present. Aortic valve mean gradient measures 4.0 mmHg. Aortic valve peak gradient measures 6.0 mmHg. Aortic valve area, by VTI measures 2.86 cm. Pulmonic Valve: The pulmonic valve was not well visualized. Pulmonic valve regurgitation is not visualized. No evidence of pulmonic stenosis. Aorta: The aortic root is normal in size and structure. Venous:  The inferior vena cava is normal in size with greater than 50% respiratory variability, suggesting right atrial pressure of 3 mmHg. IAS/Shunts: No atrial level shunt detected by color flow Doppler. Additional Comments: 3D imaging was not performed.  LEFT VENTRICLE PLAX 2D LVIDd:         3.90 cm     Diastology LVIDs:         2.80 cm     LV e' medial:    5.87 cm/s LV PW:         1.10 cm     LV E/e' medial:  11.4 LV IVS:        1.00 cm     LV e' lateral:   5.44 cm/s LVOT diam:     2.00 cm     LV E/e' lateral:  12.3 LV SV:         63 LV SV Index:   42 LVOT Area:     3.14 cm  LV Volumes (MOD) LV vol d, MOD A2C: 59.7 ml LV vol d, MOD A4C: 73.6 ml LV vol s, MOD A2C: 24.5 ml LV vol s, MOD A4C: 26.9 ml LV SV MOD A2C:     35.2 ml LV SV MOD A4C:     73.6 ml LV SV MOD BP:      43.6 ml RIGHT VENTRICLE             IVC RV Basal diam:  3.50 cm     IVC diam: 1.70 cm RV S prime:     10.60 cm/s TAPSE (M-mode): 2.0 cm LEFT ATRIUM           Index        RIGHT ATRIUM           Index LA Vol (A4C): 41.2 ml 27.28 ml/m  RA Area:     12.30 cm                                    RA Volume:   28.10 ml  18.61 ml/m  AORTIC VALVE                    PULMONIC VALVE AV Area (Vmax):    3.09 cm     PV Vmax:       0.89 m/s AV Area (Vmean):   2.41 cm     PV Peak grad:  3.2 mmHg AV Area (VTI):     2.86 cm AV Vmax:           122.00 cm/s AV Vmean:          95.600 cm/s AV VTI:            0.221 m AV Peak Grad:      6.0 mmHg AV Mean Grad:      4.0 mmHg LVOT Vmax:         120.00 cm/s LVOT Vmean:        73.200 cm/s LVOT VTI:          0.201 m LVOT/AV VTI ratio: 0.91  AORTA Ao Root diam: 2.60 cm MITRAL VALVE MV Area (PHT): 4.17 cm     SHUNTS MV Area VTI:   3.17 cm     Systemic VTI:  0.20 m MV Peak grad:  5.3 mmHg     Systemic Diam: 2.00 cm MV Mean grad:  2.0 mmHg MV Vmax:       1.15 m/s MV Vmean:      64.9 cm/s MV Decel Time: 182 msec MV E velocity: 66.80 cm/s MV A velocity: 106.00 cm/s MV E/A ratio:  0.63 Dina Rich MD Electronically signed by  Dina Rich MD Signature Date/Time: 07/14/2023/12:37:45 PM    Final    CARDIAC CATHETERIZATION Result Date: 07/11/2023 Images from the original result were not included. Coronary angiography 07/11/2023: LM: Normal LAD: No significant disease          Diag 1 with mid 30% disease Lcx: No significant disease RCA: Large, dominant           Prox 50%, mid 40% disease LVEDP 18 mmHg Contrast used: 30 cc Moderate nonobstructive coronary artery disease Given flu illness, new deep T wave inversion in anterolateral leads, trop elevation without obstructive coronary  artery disease, consider Takotsubo cardiomyopathy or viral myocarditis as differential diagnoses. Echocardiogram pending. Manish Emiliano Dyer, MD   Disposition   Pt is being discharged home today in good condition.  Follow-up Plans & Appointments    Follow-up Information     Alver Sorrow, NP Follow up.   Specialty: Cardiology Why: [facility] - [facility] location - cardiology follow-up arranged Tuesday Aug 06, 2023 at 8:25 AM. Arrive 15 minutes prior to appointment to check in. Contact information: [address] [place] [address] [phone]                  Discharge Medications   Allergies as of 07/15/2023       Reactions   Penicillins Anaphylaxis, Swelling, Rash, Other (See Comments)   Has patient had a PCN reaction causing immediate rash, facial/tongue/throat swelling, SOB or lightheadedness with hypotension: yes Has patient had a PCN reaction causing severe rash involving mucus membranes or skin necrosis: no Has patient had a PCN reaction that required hospitalization: yes Has patient had a PCN reaction occurring within the last 10 years: no If all of the above answers are "NO", then may proceed with Cephalosporin use.        Medication List     STOP taking these medications    meloxicam 7.5 MG tablet Commonly known as: MOBIC       TAKE these medications    acetaminophen 500 MG  tablet Commonly known as: TYLENOL Take 2 tablets (1,000 mg total) by mouth 3 (three) times daily.   albuterol 108 (90 Base) MCG/ACT inhaler Commonly known as: VENTOLIN HFA Inhale 2 puffs into the lungs every 6 (six) hours as needed for wheezing or shortness of breath.   alendronate 70 MG tablet Commonly known as: FOSAMAX Take 1 tablet (70 mg total) by mouth every 7 (seven) days. Take with a full glass of water on an empty stomach.   amLODipine-olmesartan 10-40 MG tablet Commonly known as: AZOR Take 1 tablet by mouth daily. For blood pressure.   arformoterol 15 MCG/2ML Nebu Commonly known as: BROVANA Take 2 mLs (15 mcg total) by nebulization 2 (two) times daily.   atorvastatin 80 MG tablet Commonly known as: LIPITOR Take 1 tablet (80 mg total) by mouth daily. What changed: when to take this   budesonide 0.5 MG/2ML nebulizer solution Commonly known as: PULMICORT Take 2 mLs (0.5 mg total) by nebulization 2 (two) times daily.   budesonide-formoterol 160-4.5 MCG/ACT inhaler Commonly known as: SYMBICORT Inhale 2 puffs into the lungs 2 (two) times daily.   cloNIDine 0.2 mg/24hr patch Commonly known as: CATAPRES - Dosed in mg/24 hr Place 1 patch (0.2 mg total) onto the skin once a week. What changed: additional instructions   clopidogrel 75 MG tablet Commonly known as: PLAVIX Take 1 tablet (75 mg total) by mouth daily.   colchicine 0.6 MG tablet Take 1 tablet (0.6 mg total) by mouth daily. Start taking on: July 16, 2023   cyanocobalamin 1000 MCG tablet Take 1 tablet (1,000 mcg total) by mouth daily.   furosemide 20 MG tablet Commonly known as: LASIX Take 1 tablet (20 mg total) by mouth daily. For swelling   guaiFENesin 600 MG 12 hr tablet Commonly known as: MUCINEX Take 600 mg by mouth 2 (two) times daily.   ibuprofen 400 MG tablet Commonly known as: ADVIL Take 1 tablet (400 mg total) by mouth 2 (two) times daily for 14 days.   ibuprofen 200 MG  tablet Commonly known as:  ADVIL Take 1 tablet (200 mg total) by mouth in the morning and at bedtime for 7 days. Start taking on: July 30, 2023   ipratropium-albuterol 0.5-2.5 (3) MG/3ML Soln Commonly known as: DUONEB Take 3 mLs by nebulization in the morning and at bedtime.   isosorbide mononitrate 60 MG 24 hr tablet Commonly known as: IMDUR Take 1 tablet (60 mg total) by mouth daily. For blood pressure and to prevent chest pain.   Magnesium 250 MG Tabs Take 1 tablet (250 mg total) by mouth daily.   pantoprazole 40 MG tablet Commonly known as: Protonix Take 1 tablet (40 mg total) by mouth daily.   potassium chloride SA 20 MEQ tablet Commonly known as: KLOR-CON M Take 1 tablet (20 mEq total) by mouth daily. For potassium replacement/ supplement   rOPINIRole 1 MG tablet Commonly known as: REQUIP Take 1 tablet (1 mg total) by mouth at bedtime. For leg cramps   SUMAtriptan 100 MG tablet Commonly known as: Imitrex Take one at onset of HA. May repeat in 2 hours if headache persists or recurs. Limit two per 24 hours   Vitamin D (Ergocalciferol) 1.25 MG (50000 UNIT) Caps capsule Commonly known as: DRISDOL Take 1 capsule (50,000 Units total) by mouth every 7 (seven) days.        Duration of Discharge Encounter: 45 minutes    Signed, Tessa Lerner, DO, [facility]  [address] [place], [place] [address] Pager: [phone] Office: [phone] 10:03 AM

## 2023-07-16 ENCOUNTER — Telehealth: Payer: Self-pay | Admitting: *Deleted

## 2023-07-16 NOTE — Transitions of Care (Post Inpatient/ED Visit) (Signed)
07/16/2023  Name: [name] MRN: [id number] DOB: Feb 28, 1941  Today's TOC FU Call Status: Today's TOC FU Call Status:: Successful TOC FU Call Completed TOC FU Call Complete Date: 07/16/23 Patient's Name and Date of Birth confirmed.  Transition Care Management Follow-up Telephone Call Date of Discharge: 07/15/23 Discharge Facility: [facility]) Type of Discharge: Inpatient Admission Primary Inpatient Discharge Diagnosis:: NSTEMI How have you been since you were released from the hospital?:  (eating and drinking well, ambulating with walker w/out difficulty) Any questions or concerns?: No  Items Reviewed: Did you receive and understand the discharge instructions provided?: Yes Medications obtained,verified, and reconciled?: Yes (Medications Reviewed) Any new allergies since your discharge?: No Dietary orders reviewed?: Yes Type of Diet Ordered:: heart healthy Do you have support at home?:  (pt states she has friends she can call on to assist wtih transportation ,etc if needed) People in Home: significant other Name of Support/Comfort Primary Source: [name] Patient declines enrollment in [facility] 30 day program, states she will continue working with longitudinal care management with LCSW and Medical illustrator Reviewed signs/ symptoms of heart attack, importance of calling 911 for chest pain, dyspnea  Medications Reviewed Today: Medications Reviewed Today     Reviewed by Audrie Gallus, RN (Registered Nurse) on 07/16/23 at 1323  Med List Status: <None>   Medication Order Taking? Sig Documenting Provider Last Dose Status Informant  acetaminophen (TYLENOL) 500 MG tablet [id number] Yes Take 2 tablets (1,000 mg total) by mouth 3 (three) times daily. Mechele Claude, MD Taking Active Self, Pharmacy Records  albuterol (VENTOLIN HFA) 108 (90 Base) MCG/ACT inhaler [id number] Yes Inhale 2 puffs into the lungs every 6 (six) hours as needed for wheezing or shortness of breath. Mechele Claude, MD Taking Active Self, Pharmacy Records  alendronate (FOSAMAX) 70 MG tablet [id number] Yes Take 1 tablet (70 mg total) by mouth every 7 (seven) days. Take with a full glass of water on an empty stomach. Mechele Claude, MD Taking Active Self, Pharmacy Records  amLODipine-olmesartan [facility]) 10-40 MG tablet [id number] Yes Take 1 tablet by mouth daily. For blood pressure. Alver Sorrow, NP Taking Active Self, Pharmacy Records  arformoterol [facility]) 15 MCG/2ML NEBU [id number] Yes Take 2 mLs (15 mcg total) by nebulization 2 (two) times daily. Hunsucker, Lesia Sago, MD Taking Active Self, Pharmacy Records  atorvastatin (LIPITOR) 80 MG tablet [id number] Yes Take 1 tablet (80 mg total) by mouth daily.  Patient taking differently: Take 80 mg by mouth at bedtime.   Alver Sorrow, NP Taking Active Self, Pharmacy Records  budesonide (PULMICORT) 0.5 MG/2ML nebulizer solution [id number] Yes Take 2 mLs (0.5 mg total) by nebulization 2 (two) times daily. Hunsucker, Lesia Sago, MD Taking Active Self, Pharmacy Records  budesonide-formoterol [facility]) 160-4.5 MCG/ACT inhaler [id number] Yes Inhale 2 puffs into the lungs 2 (two) times daily. Mechele Claude, MD Taking Active Self, Pharmacy Records  cloNIDine (CATAPRES - DOSED IN MG/24 HR) 0.2 mg/24hr patch [id number] Yes Place 1 patch (0.2 mg total) onto the skin once a week.  Patient taking differently: Place 0.2 mg onto the skin once a week. On Fridays   Mechele Claude, MD Taking Active Self, Pharmacy Records  clopidogrel (PLAVIX) 75 MG tablet [id number] Yes Take 1 tablet (75 mg total) by mouth daily. Alver Sorrow, NP Taking Active Self, Pharmacy Records  colchicine 0.6 MG tablet [id number] No Take 1 tablet (0.6 mg total) by mouth daily.  Patient not taking: Reported on 07/16/2023   Tessa Lerner, DO  Not Taking Active   furosemide (LASIX) 20 MG tablet [id number] Yes Take 1 tablet (20 mg total) by mouth daily. For swelling Alver Sorrow, NP Taking Active  Self, Pharmacy Records  guaiFENesin (MUCINEX) 600 MG 12 hr tablet [id number] No Take 600 mg by mouth 2 (two) times daily.  Patient not taking: Reported on 07/16/2023   [provider] Not Taking Active Self, Pharmacy Records  ibuprofen (ADVIL) 200 MG tablet [id number] Yes Take 1 tablet (200 mg total) by mouth in the morning and at bedtime for 7 days. Start July 30, 2023. Tessa Lerner, DO Taking Active   ibuprofen (ADVIL) 400 MG tablet [id number] Yes Take 1 tablet (400 mg total) by mouth 2 (two) times daily for 14 days. Odis Hollingshead, Sunit, DO Taking Active   ipratropium-albuterol (DUONEB) 0.5-2.5 (3) MG/3ML SOLN [id number] No Take 3 mLs by nebulization in the morning and at bedtime.  Patient not taking: Reported on 07/16/2023   Hunsucker, Lesia Sago, MD Not Taking Active Self, Pharmacy Records  isosorbide mononitrate (IMDUR) 60 MG 24 hr tablet [id number] Yes Take 1 tablet (60 mg total) by mouth daily. For blood pressure and to prevent chest pain. Alver Sorrow, NP Taking Active Self, Pharmacy Records  Magnesium 250 MG TABS [id number] Yes Take 1 tablet (250 mg total) by mouth daily. Mechele Claude, MD Taking Active Self, Pharmacy Records  pantoprazole (PROTONIX) 40 MG tablet [id number] Yes Take 1 tablet (40 mg total) by mouth daily. Tessa Lerner, DO Taking Active   potassium chloride SA (KLOR-CON M) 20 MEQ tablet [id number] Yes Take 1 tablet (20 mEq total) by mouth daily. For potassium replacement/ supplement Mechele Claude, MD Taking Active Self, Pharmacy Records  rOPINIRole (REQUIP) 1 MG tablet [id number] Yes Take 1 tablet (1 mg total) by mouth at bedtime. For leg cramps Mechele Claude, MD Taking Active Self, Pharmacy Records  SUMAtriptan (IMITREX) 100 MG tablet [id number] No Take one at onset of HA. May repeat in 2 hours if headache persists or recurs. Limit two per 24 hours  Patient not taking: Reported on 07/16/2023   Mechele Claude, MD Not Taking Active Self, Pharmacy Records           Med Note  [facility] [place], [place] A   Wed Jul 10, 2023  6:22 PM) Prn   vitamin B-12 1000 MCG tablet [id number] Yes Take 1 tablet (1,000 mcg total) by mouth daily. Cleora Fleet, MD Taking Active Self, Pharmacy Records  Vitamin D, Ergocalciferol, (DRISDOL) 1.25 MG (50000 UNIT) CAPS capsule [id number] Yes Take 1 capsule (50,000 Units total) by mouth every 7 (seven) days. Mechele Claude, MD Taking Active Self, Pharmacy Records           Med Note [facility] Elliott, SUSAN A   Wed Jul 10, 2023  6:14 PM)              Home Care and Equipment/Supplies: Were Home Health Services Ordered?: No Any new equipment or medical supplies ordered?: No  Functional Questionnaire: Do you need assistance with bathing/showering or dressing?: No Do you need assistance with eating?: No Do you have difficulty maintaining continence: No Do you need assistance with getting out of bed/getting out of a chair/moving?: Yes (uses walker) Do you have difficulty managing or taking your medications?: No  Follow up appointments reviewed: PCP Follow-up appointment confirmed?: Yes Date of PCP follow-up appointment?: 07/22/23 Follow-up Provider: Dr. Darlyn Read  @ 1255 pm Specialist Hospital Follow-up appointment confirmed?: Yes Date of Specialist follow-up appointment?: 08/06/23 Follow-Up Specialty Provider:: cardiologist  Gillian Shields  NP  @ 825 am Do you need transportation to your follow-up appointment?: No Do you understand care options if your condition(s) worsen?: Yes-patient verbalized understanding  SDOH Interventions Today    Flowsheet Row Most Recent Value  SDOH Interventions   Food Insecurity Interventions Intervention Not Indicated  Housing Interventions Intervention Not Indicated  Transportation Interventions Intervention Not Indicated  Utilities Interventions Intervention Not Indicated       Irving Shows [facility], BSN RN Care Manager/ Transition of Care [place]/ [facility] Population [facility] [phone]

## 2023-07-17 ENCOUNTER — Other Ambulatory Visit: Payer: Self-pay | Admitting: Family Medicine

## 2023-07-17 ENCOUNTER — Other Ambulatory Visit (HOSPITAL_COMMUNITY): Payer: Self-pay

## 2023-07-17 DIAGNOSIS — J441 Chronic obstructive pulmonary disease with (acute) exacerbation: Secondary | ICD-10-CM

## 2023-07-18 ENCOUNTER — Other Ambulatory Visit: Payer: Self-pay | Admitting: Family Medicine

## 2023-07-18 DIAGNOSIS — J441 Chronic obstructive pulmonary disease with (acute) exacerbation: Secondary | ICD-10-CM

## 2023-07-22 ENCOUNTER — Encounter: Payer: Self-pay | Admitting: Family Medicine

## 2023-07-22 ENCOUNTER — Ambulatory Visit (INDEPENDENT_AMBULATORY_CARE_PROVIDER_SITE_OTHER): Payer: No Typology Code available for payment source | Admitting: Family Medicine

## 2023-07-22 VITALS — BP 190/78 | HR 82 | Temp 97.3°F | Ht 61.0 in | Wt 128.0 lb

## 2023-07-22 DIAGNOSIS — J4489 Other specified chronic obstructive pulmonary disease: Secondary | ICD-10-CM | POA: Diagnosis not present

## 2023-07-22 DIAGNOSIS — I2510 Atherosclerotic heart disease of native coronary artery without angina pectoris: Secondary | ICD-10-CM | POA: Diagnosis not present

## 2023-07-22 DIAGNOSIS — I10 Essential (primary) hypertension: Secondary | ICD-10-CM

## 2023-07-22 DIAGNOSIS — M5430 Sciatica, unspecified side: Secondary | ICD-10-CM

## 2023-07-22 DIAGNOSIS — M549 Dorsalgia, unspecified: Secondary | ICD-10-CM

## 2023-07-22 MED ORDER — IPRATROPIUM-ALBUTEROL 0.5-2.5 (3) MG/3ML IN SOLN: 3.0000 mL | Freq: Two times a day (BID) | RESPIRATORY_TRACT | 3 refills | Status: DC

## 2023-07-22 MED ORDER — IBUPROFEN 400 MG PO TABS: 400.0000 mg | ORAL_TABLET | Freq: Two times a day (BID) | ORAL | 0 refills | Status: AC

## 2023-07-22 MED ORDER — CLONIDINE 0.3 MG/24HR TD PTWK: 0.3000 mg | MEDICATED_PATCH | TRANSDERMAL | 3 refills | Status: DC

## 2023-07-22 MED ORDER — AMLODIPINE-OLMESARTAN 10-40 MG PO TABS: 1.0000 | ORAL_TABLET | Freq: Every day | ORAL | 1 refills | Status: DC

## 2023-07-22 NOTE — Progress Notes (Unsigned)
 Subjective:  Patient ID: [name], female    DOB: January 20, 1941  Age: 83 y.o. MRN: [id number]  CC: Hospitalization Follow-up (Would liek to discuss with you. )   HPI [name] presents for  follow-up of hypertension. Patient has no history of headache chest pain or shortness of breath or recent cough. Patient also denies symptoms of TIA such as focal numbness or weakness. Patient denies side effects from medication. States taking it regularly. Stabbing epigastric and left midback apin. Constant. Was offered & refused morphine while hospitalized for left sided back and sciatic pain.   Having substernal pain still, multple times daily, 11 days after cath showed only nonobstructive disease. Lso reports left back pain radiaing along sciatic distributioin to the knee. Mid abdominal pain, bilaterally near th emidline radiating down to the inner thigh and the knee  of each leg.   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   She has a past surgical history that includes Abdominal hysterectomy; Anterior cervical corpectomy (03/07/2012); Tonsillectomy; Multiple tooth extractions; Cataract extraction w/PHACO (Left, 04/12/2017); Cataract extraction w/PHACO (Right, 05/03/2017); and LEFT HEART CATH AND CORONARY ANGIOGRAPHY (N/A, 07/11/2023).   Her family history includes Asthma in her daughter; Bipolar disorder in her daughter, daughter, daughter, daughter, and son; Drug abuse in her daughter; Heart disease in her daughter and daughter; Hypertension in her daughter. She was adopted.She reports that she quit smoking about 15 months ago. Her smoking use included cigarettes. She  started smoking about 63 years ago. She has a 30.9 pack-year smoking history. She has been exposed to tobacco smoke. She has never used smokeless tobacco. She reports that she does not currently use alcohol. She reports that she does not use drugs.  Current Outpatient Medications on File Prior to Visit  Medication Sig Dispense Refill   acetaminophen (TYLENOL) 500 MG tablet Take 2 tablets (1,000 mg total) by mouth 3 (three) times daily. 180 tablet PRN   albuterol (VENTOLIN HFA) 108 (90 Base) MCG/ACT inhaler INHALE 2 PUFFS INTO THE LUNGS EVERY 6 HOURS AS NEEDED FOR WHEEZE OR SHORTNESS OF BREATH 6.7 each 0   alendronate (FOSAMAX) 70 MG tablet Take 1 tablet (70 mg total) by mouth every 7 (seven) days. Take with a full glass of water on an empty stomach. 13 tablet 3   amLODipine-olmesartan (AZOR) 10-40 MG tablet Take 1 tablet by mouth daily. For blood pressure. 90 tablet 1   arformoterol (BROVANA) 15 MCG/2ML NEBU Take 2 mLs (15 mcg total) by nebulization 2 (two) times daily. 360 mL 3   atorvastatin (LIPITOR) 80 MG tablet Take 1 tablet (80 mg total) by mouth daily. (Patient taking differently: Take 80 mg by mouth at bedtime.) 90 tablet 3   budesonide (PULMICORT) 0.5 MG/2ML nebulizer solution Take 2 mLs (0.5 mg total) by nebulization 2 (two) times daily. 360 mL 3   budesonide-formoterol (SYMBICORT) 160-4.5 MCG/ACT inhaler INHALE 2 PUFFS INTO THE LUNGS TWICE A DAY 10.2 each 0   cloNIDine (CATAPRES - DOSED IN MG/24 HR) 0.2 mg/24hr patch Place 1 patch (0.2 mg total) onto the skin once a week. (Patient taking differently: Place 0.2 mg onto the skin once a week. On Fridays) 4 patch 12   clopidogrel (PLAVIX) 75 MG tablet Take 1 tablet (75 mg total) by mouth daily. 90  tablet 3   colchicine 0.6 MG tablet Take 1 tablet (0.6 mg total) by mouth daily. 90 tablet 0   furosemide (LASIX) 20 MG tablet Take 1 tablet (20 mg total) by mouth daily. For swelling 90 tablet 1   guaiFENesin (MUCINEX) 600 MG 12 hr tablet Take 600  mg by mouth 2 (two) times daily.     [START ON 07/30/2023] ibuprofen (ADVIL) 200 MG tablet Take 1 tablet (200 mg total) by mouth in the morning and at bedtime for 7 days. Start July 30, 2023. 14 tablet 0   ibuprofen (ADVIL) 400 MG tablet Take 1 tablet (400 mg total) by mouth 2 (two) times daily for 14 days. 28 tablet 0   ipratropium-albuterol (DUONEB) 0.5-2.5 (3) MG/3ML SOLN Take 3 mLs by nebulization in the morning and at bedtime. 540 mL 3   isosorbide mononitrate (IMDUR) 60 MG 24 hr tablet Take 1 tablet (60 mg total) by mouth daily. For blood pressure and to prevent chest pain. 90 tablet 1   Magnesium 250 MG TABS Take 1 tablet (250 mg total) by mouth daily. 100 tablet PRN   pantoprazole (PROTONIX) 40 MG tablet Take 1 tablet (40 mg total) by mouth daily. 30 tablet 0   potassium chloride SA (KLOR-CON M) 20 MEQ tablet Take 1 tablet (20 mEq total) by mouth daily. For potassium replacement/ supplement 30 tablet 5   rOPINIRole (REQUIP) 1 MG tablet Take 1 tablet (1 mg total) by mouth at bedtime. For leg cramps 30 tablet 2   SUMAtriptan (IMITREX) 100 MG tablet Take one at onset of HA. May repeat in 2 hours if headache persists or recurs. Limit two per 24 hours 10 tablet 11   vitamin B-12 1000 MCG tablet Take 1 tablet (1,000 mcg total) by mouth daily. 30 tablet 2   Vitamin D, Ergocalciferol, (DRISDOL) 1.25 MG (50000 UNIT) CAPS capsule Take 1 capsule (50,000 Units total) by mouth every 7 (seven) days. 13 capsule 3   No current facility-administered medications on file prior to visit.    ROS Review of Systems  Objective:  BP (!) 190/78   Pulse 82   Temp (!) 97.3 F (36.3 C)   Ht 5\' 1"  (1.549 m)   Wt 128 lb (58.1 kg)   SpO2 97%   BMI 24.19 kg/m   BP Readings from Last 3 Encounters:  07/22/23 (!) 190/78  07/15/23 138/86  06/18/23 (!) 204/86    Wt Readings from Last 3 Encounters:  07/22/23 128 lb (58.1 kg)  07/10/23 118 lb 2.7 oz (53.6 kg)  06/18/23 135 lb (61.2 kg)     Physical  Exam    Assessment & Plan:   There are no diagnoses linked to this encounter. Allergies as of 07/22/2023       Reactions   Penicillins Anaphylaxis, Swelling, Rash, Other (See Comments)   Has patient had a PCN reaction causing immediate rash, facial/tongue/throat swelling, SOB or lightheadedness with hypotension: yes Has patient had a PCN reaction causing severe rash involving mucus membranes or skin necrosis: no Has patient had a PCN reaction that required hospitalization: yes Has patient had a PCN reaction occurring within the last 10 years: no If all of the above answers are "NO", then may proceed with Cephalosporin use.        Medication List        Accurate as of July 22, 2023  1:16 PM. If you have any questions, ask your nurse or doctor.  acetaminophen 500 MG tablet Commonly known as: TYLENOL Take 2 tablets (1,000 mg total) by mouth 3 (three) times daily.   albuterol 108 (90 Base) MCG/ACT inhaler Commonly known as: VENTOLIN HFA INHALE 2 PUFFS INTO THE LUNGS EVERY 6 HOURS AS NEEDED FOR WHEEZE OR SHORTNESS OF BREATH   alendronate 70 MG tablet Commonly known as: FOSAMAX Take 1 tablet (70 mg total) by mouth every 7 (seven) days. Take with a full glass of water on an empty stomach.   amLODipine-olmesartan 10-40 MG tablet Commonly known as: AZOR Take 1 tablet by mouth daily. For blood pressure.   arformoterol 15 MCG/2ML Nebu Commonly known as: BROVANA Take 2 mLs (15 mcg total) by nebulization 2 (two) times daily.   atorvastatin 80 MG tablet Commonly known as: LIPITOR Take 1 tablet (80 mg total) by mouth daily. What changed: when to take this   budesonide 0.5 MG/2ML nebulizer solution Commonly known as: PULMICORT Take 2 mLs (0.5 mg total) by nebulization 2 (two) times daily.   budesonide-formoterol 160-4.5 MCG/ACT inhaler Commonly known as: SYMBICORT INHALE 2 PUFFS INTO THE LUNGS TWICE A DAY   cloNIDine 0.2 mg/24hr patch Commonly known as:  CATAPRES - Dosed in mg/24 hr Place 1 patch (0.2 mg total) onto the skin once a week. What changed: additional instructions   clopidogrel 75 MG tablet Commonly known as: PLAVIX Take 1 tablet (75 mg total) by mouth daily.   colchicine 0.6 MG tablet Take 1 tablet (0.6 mg total) by mouth daily.   cyanocobalamin 1000 MCG tablet Take 1 tablet (1,000 mcg total) by mouth daily.   furosemide 20 MG tablet Commonly known as: LASIX Take 1 tablet (20 mg total) by mouth daily. For swelling   guaiFENesin 600 MG 12 hr tablet Commonly known as: MUCINEX Take 600 mg by mouth 2 (two) times daily.   ibuprofen 400 MG tablet Commonly known as: ADVIL Take 1 tablet (400 mg total) by mouth 2 (two) times daily for 14 days.   FT Ibuprofen 200 MG tablet Generic drug: ibuprofen Take 1 tablet (200 mg total) by mouth in the morning and at bedtime for 7 days. Start July 30, 2023. Start taking on: July 30, 2023   ipratropium-albuterol 0.5-2.5 (3) MG/3ML Soln Commonly known as: DUONEB Take 3 mLs by nebulization in the morning and at bedtime.   isosorbide mononitrate 60 MG 24 hr tablet Commonly known as: IMDUR Take 1 tablet (60 mg total) by mouth daily. For blood pressure and to prevent chest pain.   Magnesium 250 MG Tabs Take 1 tablet (250 mg total) by mouth daily.   pantoprazole 40 MG tablet Commonly known as: Protonix Take 1 tablet (40 mg total) by mouth daily.   potassium chloride SA 20 MEQ tablet Commonly known as: KLOR-CON M Take 1 tablet (20 mEq total) by mouth daily. For potassium replacement/ supplement   rOPINIRole 1 MG tablet Commonly known as: REQUIP Take 1 tablet (1 mg total) by mouth at bedtime. For leg cramps   SUMAtriptan 100 MG tablet Commonly known as: Imitrex Take one at onset of HA. May repeat in 2 hours if headache persists or recurs. Limit two per 24 hours   Vitamin D (Ergocalciferol) 1.25 MG (50000 UNIT) Caps capsule Commonly known as: DRISDOL Take 1 capsule (50,000  Units total) by mouth every 7 (seven) days.        No orders of the defined types were placed in this encounter.   ***  Follow-up: No follow-ups on file.  Mechele Claude,  M.D.

## 2023-07-24 ENCOUNTER — Ambulatory Visit (INDEPENDENT_AMBULATORY_CARE_PROVIDER_SITE_OTHER): Payer: Medicaid Other | Admitting: Pulmonary Disease

## 2023-07-24 ENCOUNTER — Encounter: Payer: Self-pay | Admitting: Pulmonary Disease

## 2023-07-24 VITALS — BP 150/82 | HR 67 | Temp 97.9°F | Ht 62.0 in | Wt 127.6 lb

## 2023-07-24 DIAGNOSIS — I319 Disease of pericardium, unspecified: Secondary | ICD-10-CM

## 2023-07-24 DIAGNOSIS — J4489 Other specified chronic obstructive pulmonary disease: Secondary | ICD-10-CM

## 2023-07-24 NOTE — Progress Notes (Signed)
 @Patient  ID: [name], female    DOB: [birth year], 83 y.o.   MRN: [id number]  Chief Complaint  Patient presents with   Hospitalization Follow-up    Improved SOB.  Some dizziness today. Questions about dose of daily ibuprofen.    Referring provider: Mechele Claude, MD  HPI:   83 y.o. woman whom we are seeing in follow up for evaluation of dyspnea on exertion large related to COPD confirmed on PFT 04/2022 as well Hospital follow-up from recent hospitalization..  Most recent cardiology note reviewed.  D/C summary 06/2023 reviewed.    Overall doing okay.  Breathing feels stable.  Good adherence to nebulized arformoterol and budesonide.  Reminded about using DuoNebs as well.  Recent hospitalization with chest discomfort pain.  Mild elevation in troponin.  Left heart cath without obstructive coronary disease.  Diagnosed pericarditis.  Remains on ibuprofen.  Colchicine.  She has many questions about this.  Sounds like having panic attacks in the morning.  Often called ambulance.  I do not see many records of her going to the ED.  Musket better with nebulizations.  She says the breathing treatments do help.  Reminded to use these personally in the morning.  HPI at initial visit: Patient notes long history of dyspnea on exertion.  Worse with stairs or inclines.  However she remains active.  Walking up to 3 miles daily.  Unfortunate, developed severe shortness of breath end of October 2023.  Noted to be hypoxemic on room air via EMS.  Treated with steroids and inhaled bronchodilators.  Chest x-ray on my review interpretation reveals clear lungs bilaterally 03/25/2022.  Prior chest x-ray 03/12/2022 shows clear lungs bilaterally on my review and interpretation, this preceded hospitalization.  She improved.  Stable on room air.  Discharged on prednisone taper.  She is continued arformoterol and budesonide nebulizer.  She felt this certainly helps.  She is albuterol prior to walking.  Relatively rare  use of DuoNebs at the home.  She is not walking as far she was before but wants to get back up to longer walks.  She enjoys this.  She enjoys being outside, in nature, spends time praying.  She was seen by cardiology and a CT coronary scan was ordered to evaluate shortness of breath.  This did demonstrate coronary calcification is relatively significant with reported no significant flow limitation.  CT images 03/2022 demonstrate emphysema, thickened bronchioles, mosaicism, otherwise clear lungs on my review and interpretation.  PMH: Tobacco abuse in remission, hypertension, hyperlipidemia, GERD Surgical history: Hysterectomy, cataract surgery, tonsillectomy Family history: Bipolar disease, asthma, CAD, son with bipolar disease social history: Former smoker, 25-pack-year, quit in fall 2023, lives in [facility]   Questionaires / Pulmonary Flowsheets:   ACT:      No data to display          MMRC:     No data to display          Epworth:      No data to display          Tests:   FENO:  No results found for: "NITRICOXIDE"  PFT:    Latest Ref Rng & Units 05/10/2022    3:19 PM  PFT Results  FVC-Pre L 1.79   FVC-Predicted Pre % 77   FVC-Post L 1.72   FVC-Predicted Post % 74   Pre FEV1/FVC % % 55   Post FEV1/FCV % % 55   FEV1-Pre L 0.99   FEV1-Predicted Pre % 58  FEV1-Post L 0.95   DLCO uncorrected ml/min/mmHg 11.57   DLCO UNC% % 68   DLCO corrected ml/min/mmHg 11.57   DLCO COR %Predicted % 68   DLVA Predicted % 74   TLC L 5.96   TLC % Predicted % 129   RV % Predicted % 164   Personally reviewed and interpreted as moderate fixed obstruction, no bronchodilator response, lung biopsy send with hyperinflation and air trapping, DLCO moderately reduced.  WALK:      No data to display          Imaging: Personally reviewed and as per EMR and discussion in this note ECHOCARDIOGRAM COMPLETE Result Date: 07/14/2023    ECHOCARDIOGRAM REPORT   Patient  Name:   [name] Date of Exam: 07/14/2023 Medical Rec #:  [id number]        Height:       61.0 in Accession #:    [id number]       Weight:       118.2 lb Date of Birth:  1941/05/06         BSA:          1.510 m Patient Age:    82 years         BP:           129/66 mmHg Patient Gender: F                HR:           75 bpm. Exam Location:  Inpatient Procedure: 2D Echo, Color Doppler and Cardiac Doppler (Both Spectral and Color            Flow Doppler were utilized during procedure). Indications:    Chest Pain  History:        Patient has prior history of Echocardiogram examinations, most                 recent 09/17/2022. PAD and COPD; Risk Factors:Hypertension.  Sonographer:    Amy Chionchio Referring Phys: [id number] Dorothe Pea BRANCH IMPRESSIONS  1. Left ventricular ejection fraction, by estimation, is 55 to 60%. The left ventricle has normal function. The left ventricle has no regional wall motion abnormalities. There is mild left ventricular hypertrophy. Left ventricular diastolic parameters are consistent with Grade I diastolic dysfunction (impaired relaxation).  2. Right ventricular systolic function is normal. The right ventricular size is normal.  3. The mitral valve is normal in structure. No evidence of mitral valve regurgitation. No evidence of mitral stenosis.  4. The aortic valve is tricuspid. Aortic valve regurgitation is not visualized. No aortic stenosis is present.  5. The inferior vena cava is normal in size with greater than 50% respiratory variability, suggesting right atrial pressure of 3 mmHg. FINDINGS  Left Ventricle: Left ventricular ejection fraction, by estimation, is 55 to 60%. The left ventricle has normal function. The left ventricle has no regional wall motion abnormalities. Strain imaging was not performed. The left ventricular internal cavity  size was normal in size. There is mild left ventricular hypertrophy. Left ventricular diastolic parameters are consistent with Grade I  diastolic dysfunction (impaired relaxation). Normal left ventricular filling pressure. Right Ventricle: The right ventricular size is normal. Right vetricular wall thickness was not well visualized. Right ventricular systolic function is normal. Left Atrium: Left atrial size was normal in size. Right Atrium: Right atrial size was normal in size. Pericardium: There is no evidence of pericardial effusion. Mitral Valve: The mitral valve is normal in structure.  No evidence of mitral valve regurgitation. No evidence of mitral valve stenosis. MV peak gradient, 5.3 mmHg. The mean mitral valve gradient is 2.0 mmHg. Tricuspid Valve: The tricuspid valve is normal in structure. Tricuspid valve regurgitation is not demonstrated. No evidence of tricuspid stenosis. Aortic Valve: The aortic valve is tricuspid. Aortic valve regurgitation is not visualized. No aortic stenosis is present. Aortic valve mean gradient measures 4.0 mmHg. Aortic valve peak gradient measures 6.0 mmHg. Aortic valve area, by VTI measures 2.86 cm. Pulmonic Valve: The pulmonic valve was not well visualized. Pulmonic valve regurgitation is not visualized. No evidence of pulmonic stenosis. Aorta: The aortic root is normal in size and structure. Venous: The inferior vena cava is normal in size with greater than 50% respiratory variability, suggesting right atrial pressure of 3 mmHg. IAS/Shunts: No atrial level shunt detected by color flow Doppler. Additional Comments: 3D imaging was not performed.  LEFT VENTRICLE PLAX 2D LVIDd:         3.90 cm     Diastology LVIDs:         2.80 cm     LV e' medial:    5.87 cm/s LV PW:         1.10 cm     LV E/e' medial:  11.4 LV IVS:        1.00 cm     LV e' lateral:   5.44 cm/s LVOT diam:     2.00 cm     LV E/e' lateral: 12.3 LV SV:         63 LV SV Index:   42 LVOT Area:     3.14 cm  LV Volumes (MOD) LV vol d, MOD A2C: 59.7 ml LV vol d, MOD A4C: 73.6 ml LV vol s, MOD A2C: 24.5 ml LV vol s, MOD A4C: 26.9 ml LV SV MOD A2C:      35.2 ml LV SV MOD A4C:     73.6 ml LV SV MOD BP:      43.6 ml RIGHT VENTRICLE             IVC RV Basal diam:  3.50 cm     IVC diam: 1.70 cm RV S prime:     10.60 cm/s TAPSE (M-mode): 2.0 cm LEFT ATRIUM           Index        RIGHT ATRIUM           Index LA Vol (A4C): 41.2 ml 27.28 ml/m  RA Area:     12.30 cm                                    RA Volume:   28.10 ml  18.61 ml/m  AORTIC VALVE                    PULMONIC VALVE AV Area (Vmax):    3.09 cm     PV Vmax:       0.89 m/s AV Area (Vmean):   2.41 cm     PV Peak grad:  3.2 mmHg AV Area (VTI):     2.86 cm AV Vmax:           122.00 cm/s AV Vmean:          95.600 cm/s AV VTI:            0.221 m AV Peak Grad:  6.0 mmHg AV Mean Grad:      4.0 mmHg LVOT Vmax:         120.00 cm/s LVOT Vmean:        73.200 cm/s LVOT VTI:          0.201 m LVOT/AV VTI ratio: 0.91  AORTA Ao Root diam: 2.60 cm MITRAL VALVE MV Area (PHT): 4.17 cm     SHUNTS MV Area VTI:   3.17 cm     Systemic VTI:  0.20 m MV Peak grad:  5.3 mmHg     Systemic Diam: 2.00 cm MV Mean grad:  2.0 mmHg MV Vmax:       1.15 m/s MV Vmean:      64.9 cm/s MV Decel Time: 182 msec MV E velocity: 66.80 cm/s MV A velocity: 106.00 cm/s MV E/A ratio:  0.63 Dina Rich MD Electronically signed by Dina Rich MD Signature Date/Time: 07/14/2023/12:37:45 PM    Final    CARDIAC CATHETERIZATION Result Date: 07/11/2023 Images from the original result were not included. Coronary angiography 07/11/2023: LM: Normal LAD: No significant disease          Diag 1 with mid 30% disease Lcx: No significant disease RCA: Large, dominant           Prox 50%, mid 40% disease LVEDP 18 mmHg Contrast used: 30 cc Moderate nonobstructive coronary artery disease Given flu illness, new deep T wave inversion in anterolateral leads, trop elevation without obstructive coronary artery disease, consider Takotsubo cardiomyopathy or viral myocarditis as differential diagnoses. Echocardiogram pending. Elder Negus, MD    Lab  Results: Personally reviewed, notably is not as high as 500 02/2022 CBC    Component Value Date/Time   WBC 4.9 07/12/2023 0920   RBC 4.28 07/12/2023 0920   HGB 13.1 07/12/2023 0920   HGB 14.2 06/18/2023 1336   HCT 40.4 07/12/2023 0920   HCT 43.7 06/18/2023 1336   PLT 244 07/12/2023 0920   PLT 143 (L) 06/18/2023 1336   MCV 94.4 07/12/2023 0920   MCV 94 06/18/2023 1336   MCH 30.6 07/12/2023 0920   MCHC 32.4 07/12/2023 0920   RDW 13.3 07/12/2023 0920   RDW 12.3 06/18/2023 1336   LYMPHSABS 1.4 06/18/2023 1336   MONOABS 0.2 03/25/2022 0818   EOSABS 0.1 06/18/2023 1336   BASOSABS 0.0 06/18/2023 1336    BMET    Component Value Date/Time   NA 139 07/15/2023 0209   NA 145 (H) 06/11/2023 0920   K 3.8 07/15/2023 0209   CL 105 07/15/2023 0209   CO2 26 07/15/2023 0209   GLUCOSE 106 (H) 07/15/2023 0209   BUN 13 07/15/2023 0209   BUN 17 06/11/2023 0920   CREATININE 1.35 (H) 07/15/2023 0209   CALCIUM 8.4 (L) 07/15/2023 0209   GFRNONAA 40 (L) 07/15/2023 0209   GFRAA 53 (L) 01/23/2020 0700    BNP    Component Value Date/Time   BNP 51.0 03/05/2023 1438    ProBNP No results found for: "PROBNP"  Specialty Problems       Pulmonary Problems   COPD with chronic bronchitis (HCC)   Influenza A    Allergies  Allergen Reactions   Penicillins Anaphylaxis, Swelling, Rash and Other (See Comments)    Has patient had a PCN reaction causing immediate rash, facial/tongue/throat swelling, SOB or lightheadedness with hypotension: yes Has patient had a PCN reaction causing severe rash involving mucus membranes or skin necrosis: no Has patient had a PCN reaction that required hospitalization: yes  Has patient had a PCN reaction occurring within the last 10 years: no If all of the above answers are "NO", then may proceed with Cephalosporin use.     Immunization History  Administered Date(s) Administered   Fluad Quad(high Dose 65+) 02/26/2016, 02/25/2017, 02/26/2018, 02/24/2020,  02/21/2021   Fluad Trivalent(High Dose 65+) 04/09/2023   Influenza Split 02/13/2012   Influenza, High Dose Seasonal PF 02/19/2019   Influenza-Unspecified 03/28/2013   Moderna Covid-19 Fall Seasonal Vaccine 5yrs & older 04/09/2023   Moderna Covid-19 Vaccine Bivalent Booster 57yrs & up 07/18/2021   Moderna Sars-Covid-2 Vaccination 07/23/2019, 08/21/2019, 03/23/2020   Pfizer(Comirnaty)Fall Seasonal Vaccine 12 years and older 03/06/2022   Pneumococcal Conjugate-13 11/24/2014   Pneumococcal Polysaccharide-23 07/09/2016   Pneumococcal-Unspecified 07/09/2016   Tdap 11/24/2014   Zoster Recombinant(Shingrix) 12/27/2021, 06/20/2022    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   Stroke (HCC)    SLIGHT RT SIDE WEAKNESS 2001   Unintentional weight loss 03/19/2022    Tobacco History: Social History   Tobacco Use  Smoking Status Former   Current packs/day: 0.00   Average packs/day: 0.5 packs/day for 61.8 years (30.9 ttl pk-yrs)   Types: Cigarettes   Start date: 67   Quit date: 03/24/2022   Years since quitting: 1.3   Passive exposure: Past  Smokeless Tobacco Never   Counseling given: Not Answered   Continue to not smoke  Outpatient Encounter Medications as of 07/24/2023  Medication Sig   acetaminophen (TYLENOL) 500 MG tablet Take 2 tablets (1,000 mg total) by mouth 3 (three) times daily.   albuterol (VENTOLIN HFA) 108 (90 Base) MCG/ACT inhaler INHALE 2 PUFFS INTO THE LUNGS EVERY 6 HOURS AS NEEDED FOR WHEEZE OR SHORTNESS OF BREATH   alendronate (FOSAMAX) 70 MG tablet Take 1 tablet (70 mg total) by mouth every 7 (seven) days. Take with a  full glass of water on an empty stomach.   amLODipine-olmesartan (AZOR) 10-40 MG tablet Take 1 tablet by mouth daily. For blood pressure.   arformoterol (BROVANA) 15 MCG/2ML NEBU Take 2 mLs (15 mcg total) by nebulization 2 (two) times daily.   atorvastatin (LIPITOR) 80 MG tablet Take 1 tablet (80 mg total) by mouth daily. (Patient taking differently: Take 80 mg by mouth at bedtime.)   budesonide (PULMICORT) 0.5 MG/2ML nebulizer solution Take 2 mLs (0.5 mg total) by nebulization 2 (two) times daily.   budesonide-formoterol (SYMBICORT) 160-4.5 MCG/ACT inhaler INHALE 2 PUFFS INTO THE LUNGS TWICE A DAY   cloNIDine (CATAPRES - DOSED IN MG/24 HR) 0.3 mg/24hr patch Place 1 patch (0.3 mg total) onto the skin once a week.   clopidogrel (PLAVIX) 75 MG tablet Take 1 tablet (75 mg total) by mouth daily.   colchicine 0.6 MG tablet Take 1 tablet (0.6 mg total) by mouth daily.   furosemide (LASIX) 20 MG tablet Take 1 tablet (20 mg total) by mouth daily. For swelling   guaiFENesin (MUCINEX) 600 MG 12 hr tablet Take 600 mg by mouth 2 (two) times daily.   [START ON 07/30/2023] ibuprofen (ADVIL) 200 MG tablet Take 1 tablet (200 mg total) by mouth  in the morning and at bedtime for 7 days. Start July 30, 2023.   ibuprofen (ADVIL) 400 MG tablet Take 1 tablet (400 mg total) by mouth 2 (two) times daily for 14 days.   ipratropium-albuterol (DUONEB) 0.5-2.5 (3) MG/3ML SOLN Take 3 mLs by nebulization in the morning and at bedtime. May increase to 4 times a day as needed for increasing shortness of breath.   isosorbide mononitrate (IMDUR) 60 MG 24 hr tablet Take 1 tablet (60 mg total) by mouth daily. For blood pressure and to prevent chest pain.   Magnesium 250 MG TABS Take 1 tablet (250 mg total) by mouth daily.   pantoprazole (PROTONIX) 40 MG tablet Take 1 tablet (40 mg total) by mouth daily.   potassium chloride SA (KLOR-CON M) 20 MEQ tablet Take 1 tablet (20 mEq total) by mouth daily. For potassium replacement/ supplement    rOPINIRole (REQUIP) 1 MG tablet Take 1 tablet (1 mg total) by mouth at bedtime. For leg cramps   SUMAtriptan (IMITREX) 100 MG tablet Take one at onset of HA. May repeat in 2 hours if headache persists or recurs. Limit two per 24 hours   vitamin B-12 1000 MCG tablet Take 1 tablet (1,000 mcg total) by mouth daily.   Vitamin D, Ergocalciferol, (DRISDOL) 1.25 MG (50000 UNIT) CAPS capsule Take 1 capsule (50,000 Units total) by mouth every 7 (seven) days.   No facility-administered encounter medications on file as of 07/24/2023.     Review of Systems  Review of Systems  N/a Physical Exam  BP (!) 150/82 (BP Location: Left Arm, Patient Position: Sitting, Cuff Size: Normal)   Pulse 67   Temp 97.9 F (36.6 C) (Oral)   Ht 5\' 2"  (1.575 m)   Wt 127 lb 9.6 oz (57.9 kg)   SpO2 97%   BMI 23.34 kg/m   Wt Readings from Last 5 Encounters:  07/24/23 127 lb 9.6 oz (57.9 kg)  07/22/23 128 lb (58.1 kg)  07/10/23 118 lb 2.7 oz (53.6 kg)  06/18/23 135 lb (61.2 kg)  06/11/23 134 lb 3.2 oz (60.9 kg)    BMI Readings from Last 5 Encounters:  07/24/23 23.34 kg/m  07/22/23 24.19 kg/m  07/10/23 22.33 kg/m  06/18/23 24.69 kg/m  06/11/23 24.62 kg/m     Physical Exam General: Sitting in chair, no acute distress Eyes: EOMI, icterus Neck: Supple, JVP Pulmonary: Clear, distant, normal work of breathing Cardiovascular: Warm, no edema Abdomen: Thin, sounds present MSK: No synovitis, no joint effusion Neuro: Normal gait, no weakness Psych: Normal mood, full affect   Assessment & Plan:   COPD Gold B: PFTs 04/2022 moderate obstruction.  Relatively stable symptoms on ICS LABA nebulizer.  Reminded to use DuoNebs twice a day, especially with morning dose given morning seems to be a trouble time.  DOE: Suspect related to COPD.  Stable, good exercise tolerance.  Symptoms stable on above regimen.  Pericarditis: Recent diagnosis.  Recommend continue ibuprofen for 2 weeks then stop.  Continue  colchicine.  Advised additional questions to be directed to cardiology team.   Return in about 6 months (around 01/21/2024) for f/u Dr. Judeth Horn.   Karren Burly, MD 07/24/2023  I spent 40 minutes in care of patient including review of records, face-to-face visit, coordination care.

## 2023-07-24 NOTE — Patient Instructions (Signed)
 Nice to see you again  Continue using arformoterol, budesonide and DuoNebs twice a day, morning and evening  No changes to inhaler medicines  Continue ibuprofen for 2 weeks total from discharge.  Continue the colchicine as prescribed.  Until you see the cardiologist.  Feel free to send the messages about questions regarding these medications in the future.  These medications are to treat the pericarditis that you were diagnosed with in the hospital.  This is inflammation of the lining of the heart.  The colchicine, ibuprofen helps reduce inflammation and improve this.  Return to clinic in 6 months or sooner as needed with Dr. Judeth Horn

## 2023-07-26 ENCOUNTER — Encounter: Payer: Self-pay | Admitting: Family Medicine

## 2023-07-29 ENCOUNTER — Other Ambulatory Visit: Payer: No Typology Code available for payment source

## 2023-07-29 DIAGNOSIS — M549 Dorsalgia, unspecified: Secondary | ICD-10-CM | POA: Diagnosis not present

## 2023-07-29 DIAGNOSIS — I10 Essential (primary) hypertension: Secondary | ICD-10-CM | POA: Diagnosis not present

## 2023-07-29 DIAGNOSIS — J4489 Other specified chronic obstructive pulmonary disease: Secondary | ICD-10-CM | POA: Diagnosis not present

## 2023-07-29 DIAGNOSIS — I2510 Atherosclerotic heart disease of native coronary artery without angina pectoris: Secondary | ICD-10-CM | POA: Diagnosis not present

## 2023-07-29 DIAGNOSIS — M5430 Sciatica, unspecified side: Secondary | ICD-10-CM | POA: Diagnosis not present

## 2023-07-30 DIAGNOSIS — I10 Essential (primary) hypertension: Secondary | ICD-10-CM | POA: Diagnosis not present

## 2023-07-30 DIAGNOSIS — Z008 Encounter for other general examination: Secondary | ICD-10-CM | POA: Diagnosis not present

## 2023-07-31 LAB — CBC WITH DIFFERENTIAL/PLATELET
Basophils Absolute: 0 10*3/uL (ref 0.0–0.2)
Basos: 1 %
EOS (ABSOLUTE): 0.1 10*3/uL (ref 0.0–0.4)
Eos: 3 %
Hematocrit: 44.4 % (ref 34.0–46.6)
Hemoglobin: 14.1 g/dL (ref 11.1–15.9)
Immature Grans (Abs): 0 10*3/uL (ref 0.0–0.1)
Immature Granulocytes: 0 %
Lymphocytes Absolute: 1.4 10*3/uL (ref 0.7–3.1)
Lymphs: 29 %
MCH: 30.3 pg (ref 26.6–33.0)
MCHC: 31.8 g/dL (ref 31.5–35.7)
MCV: 95 fL (ref 79–97)
Monocytes Absolute: 0.5 10*3/uL (ref 0.1–0.9)
Monocytes: 10 %
Neutrophils Absolute: 2.9 10*3/uL (ref 1.4–7.0)
Neutrophils: 57 %
Platelets: 206 10*3/uL (ref 150–450)
RBC: 4.66 x10E6/uL (ref 3.77–5.28)
RDW: 12.3 % (ref 11.7–15.4)
WBC: 5 10*3/uL (ref 3.4–10.8)

## 2023-07-31 LAB — CMP14+EGFR
ALT: 9 IU/L (ref 0–32)
AST: 14 IU/L (ref 0–40)
Albumin: 3.6 g/dL — ABNORMAL LOW (ref 3.8–4.8)
Alkaline Phosphatase: 66 IU/L (ref 44–121)
BUN/Creatinine Ratio: 9 — ABNORMAL LOW (ref 12–28)
BUN: 12 mg/dL (ref 8–27)
Bilirubin Total: 0.4 mg/dL (ref 0.0–1.2)
CO2: 24 mmol/L (ref 20–29)
Calcium: 9 mg/dL (ref 8.7–10.3)
Chloride: 105 mmol/L (ref 96–106)
Creatinine, Ser: 1.29 mg/dL — ABNORMAL HIGH (ref 0.57–1.00)
Globulin, Total: 2.8 g/dL (ref 1.5–4.5)
Glucose: 92 mg/dL (ref 70–99)
Potassium: 3.9 mmol/L (ref 3.5–5.2)
Sodium: 145 mmol/L — ABNORMAL HIGH (ref 134–144)
Total Protein: 6.4 g/dL (ref 6.0–8.5)
eGFR: 42 mL/min/{1.73_m2} — ABNORMAL LOW (ref >59)

## 2023-07-31 LAB — BRAIN NATRIURETIC PEPTIDE: BNP: 142.8 pg/mL — ABNORMAL HIGH (ref 0.0–100.0)

## 2023-07-31 NOTE — Progress Notes (Signed)
 Hello [name],  Your lab result is normal and/or stable.Some minor variations that are not significant are commonly marked abnormal, but do not represent any medical problem for you.  Best regards, Mechele Claude, M.D.

## 2023-08-05 ENCOUNTER — Encounter: Payer: Self-pay | Admitting: Family Medicine

## 2023-08-05 ENCOUNTER — Ambulatory Visit (INDEPENDENT_AMBULATORY_CARE_PROVIDER_SITE_OTHER): Payer: No Typology Code available for payment source | Admitting: Family Medicine

## 2023-08-05 VITALS — BP 186/69 | HR 78 | Temp 97.7°F | Ht 62.0 in | Wt 128.0 lb

## 2023-08-05 DIAGNOSIS — F411 Generalized anxiety disorder: Secondary | ICD-10-CM | POA: Diagnosis not present

## 2023-08-05 DIAGNOSIS — I1A0 Resistant hypertension: Secondary | ICD-10-CM | POA: Diagnosis not present

## 2023-08-05 DIAGNOSIS — R0609 Other forms of dyspnea: Secondary | ICD-10-CM | POA: Diagnosis not present

## 2023-08-05 MED ORDER — QUETIAPINE FUMARATE 25 MG PO TABS: 25.0000 mg | ORAL_TABLET | Freq: Every day | ORAL | 1 refills | Status: DC

## 2023-08-05 NOTE — Progress Notes (Signed)
 Subjective:  Patient ID: [name], female    DOB: [birth year]  Age: 83 y.o. MRN: [id number]  CC: Back Pain (Back pain going down to her knees. On both sides. Constant pain when walking. Cause pain and tingling. )   HPI [name] presents for  follow-up of hypertension.  Patient also denies symptoms of TIA such as focal numbness or weakness. Patient denies side effects from medication. States taking it regularly.Wearing the clonidine patch. Put it on three days ago. Has shortness of breath when she walks to her car to leave home, but not elsewhere. States it is due to being anxious about going somewhere. Demonstrates a heavy, panting like breathing pattern that she says resolves as soon as she is seated in the car.  She will have some chest tingling for a few moments as well. Can go for a walk at home without any symptoms.   Pt. Reports pain radiating from the lumbar back, midline, L. It goes into the inguinal area and then, near the groin it radiates further down the medial thighs to the knees, and sometimes to the feet. Frequent occurrence.    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   She has a past surgical history that includes Abdominal hysterectomy; Anterior cervical corpectomy (03/07/2012); Tonsillectomy; Multiple tooth extractions; Cataract extraction w/PHACO (Left, 04/12/2017); Cataract extraction w/PHACO (Right, 05/03/2017); and LEFT HEART CATH AND CORONARY ANGIOGRAPHY (N/A, 07/11/2023).   Her family history includes Asthma in her daughter; Bipolar disorder in her daughter, daughter, daughter, daughter, and son; Drug abuse in her daughter; Heart  disease in her daughter and daughter; Hypertension in her daughter. She was adopted.She reports that she quit smoking about 16 months ago. Her smoking use included cigarettes. She started smoking about 63 years ago. She has a 30.9 pack-year smoking history. She has been exposed to tobacco smoke. She has never used smokeless tobacco. She reports that she does not currently use alcohol. She reports that she does not use drugs.  Current Outpatient Medications on File Prior to Visit  Medication Sig Dispense Refill   acetaminophen (TYLENOL) 500 MG tablet Take 2 tablets (1,000 mg total) by mouth 3 (three) times daily. 180 tablet PRN   albuterol (VENTOLIN HFA) 108 (90 Base) MCG/ACT inhaler INHALE 2 PUFFS INTO THE LUNGS EVERY 6 HOURS AS NEEDED FOR WHEEZE OR SHORTNESS OF BREATH 6.7 each 0   alendronate (FOSAMAX) 70 MG tablet Take 1 tablet (70 mg total) by mouth every 7 (seven) days. Take with a full glass of water on an empty stomach. 13 tablet 3   amLODipine-olmesartan (AZOR) 10-40 MG tablet Take 1 tablet by mouth daily. For blood pressure. 90 tablet 1   arformoterol (BROVANA) 15 MCG/2ML NEBU Take 2 mLs (15 mcg total) by nebulization 2 (two) times daily. 360 mL 3   atorvastatin (LIPITOR) 80 MG tablet Take 1 tablet (80 mg total) by mouth daily. (Patient taking differently: Take 80 mg by mouth at bedtime.) 90 tablet 3   budesonide (PULMICORT) 0.5 MG/2ML nebulizer solution Take 2 mLs (0.5 mg total) by nebulization 2 (two) times daily. 360 mL 3   budesonide-formoterol (SYMBICORT) 160-4.5 MCG/ACT inhaler INHALE 2 PUFFS INTO THE LUNGS TWICE A DAY 10.2 each 0   cloNIDine (CATAPRES - DOSED IN MG/24 HR) 0.3 mg/24hr  patch Place 1 patch (0.3 mg total) onto the skin once a week. 30 patch 3   clopidogrel (PLAVIX) 75 MG tablet Take 1 tablet (75 mg total) by mouth daily. 90 tablet 3   colchicine 0.6 MG tablet Take 1 tablet (0.6 mg total) by mouth daily. 90 tablet 0   furosemide (LASIX) 20 MG tablet Take 1 tablet (20 mg  total) by mouth daily. For swelling 90 tablet 1   guaiFENesin (MUCINEX) 600 MG 12 hr tablet Take 600 mg by mouth 2 (two) times daily.     ibuprofen (ADVIL) 200 MG tablet Take 1 tablet (200 mg total) by mouth in the morning and at bedtime for 7 days. Start July 30, 2023. 14 tablet 0   ibuprofen (ADVIL) 400 MG tablet Take 1 tablet (400 mg total) by mouth 2 (two) times daily for 14 days. 28 tablet 0   ipratropium-albuterol (DUONEB) 0.5-2.5 (3) MG/3ML SOLN Take 3 mLs by nebulization in the morning and at bedtime. May increase to 4 times a day as needed for increasing shortness of breath. 540 mL 3   isosorbide mononitrate (IMDUR) 60 MG 24 hr tablet Take 1 tablet (60 mg total) by mouth daily. For blood pressure and to prevent chest pain. 90 tablet 1   Magnesium 250 MG TABS Take 1 tablet (250 mg total) by mouth daily. 100 tablet PRN   pantoprazole (PROTONIX) 40 MG tablet Take 1 tablet (40 mg total) by mouth daily. 30 tablet 0   potassium chloride SA (KLOR-CON M) 20 MEQ tablet Take 1 tablet (20 mEq total) by mouth daily. For potassium replacement/ supplement 30 tablet 5   rOPINIRole (REQUIP) 1 MG tablet Take 1 tablet (1 mg total) by mouth at bedtime. For leg cramps 30 tablet 2   SUMAtriptan (IMITREX) 100 MG tablet Take one at onset of HA. May repeat in 2 hours if headache persists or recurs. Limit two per 24 hours 10 tablet 11   vitamin B-12 1000 MCG tablet Take 1 tablet (1,000 mcg total) by mouth daily. 30 tablet 2   Vitamin D, Ergocalciferol, (DRISDOL) 1.25 MG (50000 UNIT) CAPS capsule Take 1 capsule (50,000 Units total) by mouth every 7 (seven) days. 13 capsule 3   No current facility-administered medications on file prior to visit.    ROS Review of Systems  Constitutional: Negative.   HENT: Negative.    Eyes:  Negative for visual disturbance.  Respiratory:  Positive for shortness of breath.   Cardiovascular:  Negative for chest pain.  Gastrointestinal:  Negative for abdominal pain.   Musculoskeletal:  Negative for arthralgias.  Psychiatric/Behavioral:  The patient is nervous/anxious.     Objective:  BP (!) 186/69   Pulse 78   Temp 97.7 F (36.5 C)   Ht 5\' 2"  (1.575 m)   Wt 128 lb (58.1 kg)   SpO2 94%   BMI 23.41 kg/m   BP Readings from Last 3 Encounters:  08/05/23 (!) 186/69  07/24/23 (!) 150/82  07/22/23 (!) 190/78    Wt Readings from Last 3 Encounters:  08/05/23 128 lb (58.1 kg)  07/24/23 127 lb 9.6 oz (57.9 kg)  07/22/23 128 lb (58.1 kg)     Physical Exam Constitutional:      General: She is not in acute distress.    Appearance: She is well-developed.  Cardiovascular:     Rate and Rhythm: Normal rate and regular rhythm.  Pulmonary:     Breath sounds: Normal breath sounds.  Musculoskeletal:  General: Normal range of motion.  Skin:    General: Skin is warm and dry.  Neurological:     Mental Status: She is alert and oriented to person, place, and time.       Assessment & Plan:   [name] was seen today for back pain.  Diagnoses and all orders for this visit:  Resistant hypertension -     Ambulatory referral to [facility]  GAD (generalized anxiety disorder) -     QUEtiapine (SEROQUEL) 25 MG tablet; Take 1 tablet (25 mg total) by mouth at bedtime.  Other form of dyspnea   Allergies as of 08/05/2023       Reactions   Penicillins Anaphylaxis, Swelling, Rash, Other (See Comments)   Has patient had a PCN reaction causing immediate rash, facial/tongue/throat swelling, SOB or lightheadedness with hypotension: yes Has patient had a PCN reaction causing severe rash involving mucus membranes or skin necrosis: no Has patient had a PCN reaction that required hospitalization: yes Has patient had a PCN reaction occurring within the last 10 years: no If all of the above answers are "NO", then may proceed with Cephalosporin use.        Medication List        Accurate as of August 05, 2023  9:48 AM. If you have  any questions, ask your nurse or doctor.          acetaminophen 500 MG tablet Commonly known as: TYLENOL Take 2 tablets (1,000 mg total) by mouth 3 (three) times daily.   albuterol 108 (90 Base) MCG/ACT inhaler Commonly known as: VENTOLIN HFA INHALE 2 PUFFS INTO THE LUNGS EVERY 6 HOURS AS NEEDED FOR WHEEZE OR SHORTNESS OF BREATH   alendronate 70 MG tablet Commonly known as: FOSAMAX Take 1 tablet (70 mg total) by mouth every 7 (seven) days. Take with a full glass of water on an empty stomach.   amLODipine-olmesartan 10-40 MG tablet Commonly known as: AZOR Take 1 tablet by mouth daily. For blood pressure.   arformoterol 15 MCG/2ML Nebu Commonly known as: BROVANA Take 2 mLs (15 mcg total) by nebulization 2 (two) times daily.   atorvastatin 80 MG tablet Commonly known as: LIPITOR Take 1 tablet (80 mg total) by mouth daily. What changed: when to take this   budesonide 0.5 MG/2ML nebulizer solution Commonly known as: PULMICORT Take 2 mLs (0.5 mg total) by nebulization 2 (two) times daily.   budesonide-formoterol 160-4.5 MCG/ACT inhaler Commonly known as: SYMBICORT INHALE 2 PUFFS INTO THE LUNGS TWICE A DAY   cloNIDine 0.3 mg/24hr patch Commonly known as: CATAPRES - Dosed in mg/24 hr Place 1 patch (0.3 mg total) onto the skin once a week.   clopidogrel 75 MG tablet Commonly known as: PLAVIX Take 1 tablet (75 mg total) by mouth daily.   colchicine 0.6 MG tablet Take 1 tablet (0.6 mg total) by mouth daily.   cyanocobalamin 1000 MCG tablet Take 1 tablet (1,000 mcg total) by mouth daily.   ibuprofen 400 MG tablet Commonly known as: ADVIL Take 1 tablet (400 mg total) by mouth 2 (two) times daily for 14 days.   FT Ibuprofen 200 MG tablet Generic drug: ibuprofen Take 1 tablet (200 mg total) by mouth in the morning and at bedtime for 7 days. Start July 30, 2023.   furosemide 20 MG tablet Commonly known as: LASIX Take 1 tablet (20 mg total) by mouth daily. For  swelling   guaiFENesin 600 MG 12 hr tablet Commonly known as: MUCINEX  Take 600 mg by mouth 2 (two) times daily.   ipratropium-albuterol 0.5-2.5 (3) MG/3ML Soln Commonly known as: DUONEB Take 3 mLs by nebulization in the morning and at bedtime. May increase to 4 times a day as needed for increasing shortness of breath.   isosorbide mononitrate 60 MG 24 hr tablet Commonly known as: IMDUR Take 1 tablet (60 mg total) by mouth daily. For blood pressure and to prevent chest pain.   Magnesium 250 MG Tabs Take 1 tablet (250 mg total) by mouth daily.   pantoprazole 40 MG tablet Commonly known as: Protonix Take 1 tablet (40 mg total) by mouth daily.   potassium chloride SA 20 MEQ tablet Commonly known as: KLOR-CON M Take 1 tablet (20 mEq total) by mouth daily. For potassium replacement/ supplement   QUEtiapine 25 MG tablet Commonly known as: SEROQUEL Take 1 tablet (25 mg total) by mouth at bedtime. Started by: Nijah Tejera   rOPINIRole 1 MG tablet Commonly known as: REQUIP Take 1 tablet (1 mg total) by mouth at bedtime. For leg cramps   SUMAtriptan 100 MG tablet Commonly known as: Imitrex Take one at onset of HA. May repeat in 2 hours if headache persists or recurs. Limit two per 24 hours   Vitamin D (Ergocalciferol) 1.25 MG (50000 UNIT) Caps capsule Commonly known as: DRISDOL Take 1 capsule (50,000 Units total) by mouth every 7 (seven) days.        Meds ordered this encounter  Medications   QUEtiapine (SEROQUEL) 25 MG tablet    Sig: Take 1 tablet (25 mg total) by mouth at bedtime.    Dispense:  90 tablet    Refill:  1    The dyspnea is likely anxiety based. She states BP goes up at moments like this, begging the question of if the BP is also anxiety driven. Due to the multiple types of higher potency antihypertensives, along with continued bp elevation, I would like her evaluated at the advanced HTN clinic.  Follow-up: Return in about 1 month (around  09/05/2023).  Mechele Claude, M.D.

## 2023-08-06 ENCOUNTER — Ambulatory Visit (INDEPENDENT_AMBULATORY_CARE_PROVIDER_SITE_OTHER): Payer: No Typology Code available for payment source | Admitting: Family

## 2023-08-06 ENCOUNTER — Encounter (HOSPITAL_BASED_OUTPATIENT_CLINIC_OR_DEPARTMENT_OTHER): Payer: Self-pay | Admitting: Family

## 2023-08-06 VITALS — BP 183/92 | HR 70 | Ht 61.75 in | Wt 127.8 lb

## 2023-08-06 DIAGNOSIS — I10 Essential (primary) hypertension: Secondary | ICD-10-CM | POA: Diagnosis not present

## 2023-08-06 DIAGNOSIS — J4489 Other specified chronic obstructive pulmonary disease: Secondary | ICD-10-CM

## 2023-08-06 DIAGNOSIS — I701 Atherosclerosis of renal artery: Secondary | ICD-10-CM

## 2023-08-06 DIAGNOSIS — I25118 Atherosclerotic heart disease of native coronary artery with other forms of angina pectoris: Secondary | ICD-10-CM | POA: Diagnosis not present

## 2023-08-06 DIAGNOSIS — I719 Aortic aneurysm of unspecified site, without rupture: Secondary | ICD-10-CM

## 2023-08-06 DIAGNOSIS — E785 Hyperlipidemia, unspecified: Secondary | ICD-10-CM

## 2023-08-06 DIAGNOSIS — I1A0 Resistant hypertension: Secondary | ICD-10-CM | POA: Diagnosis not present

## 2023-08-06 DIAGNOSIS — I300 Acute nonspecific idiopathic pericarditis: Secondary | ICD-10-CM | POA: Diagnosis not present

## 2023-08-06 DIAGNOSIS — N1831 Chronic kidney disease, stage 3a: Secondary | ICD-10-CM

## 2023-08-06 DIAGNOSIS — I700 Atherosclerosis of aorta: Secondary | ICD-10-CM

## 2023-08-06 MED ORDER — HYDRALAZINE HCL 50 MG PO TABS
50.0000 mg | ORAL_TABLET | Freq: Once | ORAL | Status: AC
  Administered 2023-08-06: 50 mg via ORAL

## 2023-08-06 MED ORDER — HYDRALAZINE HCL 50 MG PO TABS: 50.0000 mg | ORAL_TABLET | Freq: Three times a day (TID) | ORAL | 3 refills | Status: DC

## 2023-08-06 NOTE — Progress Notes (Signed)
 Cardiology Office Note:  .   Date:  08/06/2023  ID:  ILLENE Cisneros, DOB 07-08-1940, MRN [id number] PCP: Mechele Claude, MD  [facility] Providers Cardiologist:  Chilton Si, MD    History of Present Illness: [name]   [name] is a 83 y.o. female  with a hx of CAD, hyperlipidemia, penetrating aortic ulcer, CVA, COPD, hypertension, CKD 3A, GERD.   Established with Dr. Duke Salvia of 03/19/2022 due to atypical chest pain.  Cardiac CTA ordered and performed 03/29/2022 calcium score 396 placing her in the 85th percentile for age/sex/race matched control.  Moderate nonobstructive disease with no significant lesion by FFR.   Admitted 10/29 - 03/27/2022 after presenting with COPD exacerbation as well as AKI.  Her ARB and HCTZ were held.     When seen 04/17/2022 hydralazine 25 mg twice daily initiated and later increased.  At visit 07/04/2022 hydrochlorothiazide 12.5 mg was added to her valsartan/HCTZ 160/12.5 mg QD.   Admitted 4/21 - 09/20/2022 with chest discomfort, hypertensive urgency.  Blood pressure initially up to 237.  Started on nicardipine drip.  CTA chest/abdomen/pelvis showed partially calcified atherosclerotic plaque with focal stenosis of the left renal artery with moderate atrophy of the left kidney.  Evaluated by [facility] Dr. Chestine Spore with good celiac trunk and SMA flow recommended to follow-up regarding renal stenosis in outpatient setting.  Valsartan and hydrochlorothiazide were discontinued due to renal function.  She was discharged on  amlodipine 10 mg daily, hydralazine 50 mg TID, isosorbide dinitrate 20 mg TID,  bisoprolol 5 mg daily.   At visit 10/15/22,  Zetia was added as LDL not at goal.   ED visit 12/07/22 with elevated BP which improved to 130 systolic without intervention.  CXR showed interstitial edema and she was given dose of Lasix. New age indeterminate infarct in left banglia CVA.  No focal neurological deficits noted.  PCP has since ordered MRI brain which has not yet  been performed.  She was referred to home health.  At visit 12/31/2022 due to palpitations and new finding of age-indeterminate infarct of left basal ganglia (by CT 12/07/22) by CT ZIO was placed but did not reveal atrial fibrillation.  LDL remained above goal of less than 70 she was referred to Pharm.[facility]. lipid clinic and started on Repatha.  At visit 05/06/2023 with elevated BP, Isordil transitioned to Imdur. She was not taking Zetia, Repatha, nor Atorvastatin but agreeable to resume Atorvastatin. PCP had reduced Furosemide and Metoprolol dose with improvement in energy level, reduced doses were continued. Amlodipine and Olmesartan transition amlodipine-olmesartan.  Clonidine 0.1 mg patch subsequently added for BP control.PCP discontineud Metoprolol 04/2023 due to bradycardia fatigue.   Last seen 06/11/23. She again was not taking cholesterol medications and Atorvastatin resumed. BP reasonably controlled, regimen of Amlodipine-Olmesartan 10-40mg  daily, Imdur 60mg  daily, Clonidine 0.1mg  patch weekly,  and Lasix 20mg  daily continued.   She started having flulike symptoms 07/05/23. She went to [facility] 07/10/23. Troponin 57 ? 366 ? 551. ProBNP 637. EKG with progressive TWI in anterior leads. She was transferred to [facility] and had LHC with nonobstructive disease. Due to elevated ESR and CRP, she was started on colchicine and NSAIDs for pericarditis.   She presents today for follow up independently. PCP started Seroquel yesterday yesterday due to dyspnea felt to be anxiety driven. There was question if her BP could also be anxiety driven. Will notice dyspnea with rushed activity or if someone makes her mad. Endorses taking her cardiac medications routinely at 6AM  and 6:30PM. Cares for her husband with diabetes. Notes initial diagnosis of hypertension in 1987 at time of CVA and it has been persistently difficult to control. BP elevated in the morning SBP 200s then goes down throughout the day as low as 160s. We  reviewed BP better controlled while hospitalized, she attributes this to sitting still.   Medication regimen Changes Clondine patch on Fridays AM: Amlodipine-Olmesartan, Lasix PM: Isosorbide  Prior antihypertensives Metoprolol - fatigue Bisoprolol Hydrochlorothiazide Lisinopril - changed to ARB Clonidine tablet - transitioned to patch Valsartan Losartan  ROS: Please see the history of present illness.    All other systems reviewed and are negative.   Studies Reviewed: .             Risk Assessment/Calculations:     HYPERTENSION CONTROL Vitals:   08/06/23 0803 08/06/23 0857  BP: (!) 220/85 (!) 183/92    The patient's blood pressure is elevated above target today.  In order to address the patient's elevated BP: A new medication was prescribed today.          Physical Exam:   VS:  BP (!) 183/92 Comment: post hydralazine 50mg   Pulse 70   Ht 5' 1.75" (1.568 m)   Wt 127 lb 12.8 oz (58 kg)   BMI 23.56 kg/m    Wt Readings from Last 3 Encounters:  08/06/23 127 lb 12.8 oz (58 kg)  08/05/23 128 lb (58.1 kg)  07/24/23 127 lb 9.6 oz (57.9 kg)    Vitals:   08/06/23 0803 08/06/23 0857  BP: (!) 220/85 (!) 183/92 Comment: post hydralazine 50mg   Pulse: 70   Height: 5' 1.75" (1.568 m)   Weight: 127 lb 12.8 oz (58 kg)   BMI (Calculated): 23.58    GEN: Well nourished, well developed in no acute distress NECK: No JVD; No carotid bruits CARDIAC: RRR, no murmurs, rubs, gallops RESPIRATORY:  Clear to auscultation without rales, wheezing or rhonchi  ABDOMEN: Soft, non-tender, non-distended EXTREMITIES:  No edema; No deformity   ASSESSMENT AND PLAN: .    Pericarditis -Admitted 07/10/2023 with pericarditis setting of flu  Completed a course of ibuprofen.  Continue colchicine 0.6 mg daily for 3 months then discontinue.  She has not had recurrent chest discomfort.  Nonobstructive CAD/HLD, LDL goal <70- CTA 03/29/2022 with coronary calcium score of 396 placing in the 85th  percentile.  LHC 07/11/2023 with proximal RCA 50%, mid RCA 40%, D1 30% stenosis with moderate nonobstructive CAD. Stable with no anginal symptoms. No indication for ischemic evaluation.   GDMT Plavix 75 mg daily, atorvastatin 80 mg daily.  Metoprolol previously discontinued due to fatigue. 07/11/23 LDL 113. Reports taking Atorvastatin 80mg  daily as prescribed. Previously prescribed Zetia and Repatha but had difficulty with medication adherence in setting of polypharmacy.  Will reach out to pharmacy team to inquire if she is Leqvio candidate.   HTN - Initially diagnosed in 1987 in setting of CVA. At visit 06/11/23 BP 130/84. BP during recent hospitalization with range 98/59- 170/71 but average SBP 130s-140s. BP in clinic markedly elevated 220/85 despite taking medications at 6 AM today. Hydralazine 50mg  given in clinic with improvement in BP to 183/92 after 15 minutes.  Medication management: Hydralazine previously discontinued, unclear why. Reports no prior side effects. Resume Hydralazine 50mg  TID.  Metoprolol previously discontinued due to fatige. Continue Amlodipine-Olmesartan 10-40mg  QAM, Imdur 60mg  QHS, Clonidine 0.1mg  patch weekly, Lasix 20mg  QAM.  Discussed to monitor BP at home at least 2 hours after medications and sitting for 5-10  minutes. Bring pill bottles and BP cuff to next OV. Future considerations: increased dose Imdur vs Hydralazine, addition Spironolactone Secondary Hypertension Workup: 04/02/23 renal duplex with no evidence of high grade left renal artery stenosis. Prior CTA plaque at left renal artery origin. No intervention recommended. Recommended for repeat duplex and [facility] follow up in 1 year. HTN not felt to be driven by RAS.  08/2022 and 02/2023 CT normal adrenals - no evidence of pheochromocytoma. 10/2022 normal TSH, update TSH today Renin-aldosterone today Consider participation in Purdy trial when phase 2 starts  PAD / RAS - Follows with Dr. Chestine Spore. No new claudication  symptoms.   Hx of CVA - Continue plavix 75mg  daily, atorvastatin 80mg  daily. Prior monitor with no atrial fib.   CKDIIIa- Careful titration of diuretic and antihypertensive.  BMET today for monitoring.   COPD/Tobacco use- Follows with pulmonology. No signs of acute exacerbation. Congratulated on continued cessation. Per 07/24/23 pulmonology note DOE felt to be related to COPD.        Dispo: follow up in 2 weeks with Alver Sorrow, NP and in 2 months with Dr. Duke Salvia in [facility]   Signed, Alver Sorrow, NP

## 2023-08-06 NOTE — Patient Instructions (Addendum)
 Medication Instructions:   START Hydralazine 50mg  three times per day  *If you need a refill on your cardiac medications before your next appointment, please call your pharmacy*   Lab Work: Your physician recommends that you return for lab work today: TSH, renin-aldosterone, BMET  If you have labs (blood work) drawn today and your tests are completely normal, you will receive your results only by: MyChart Message (if you have MyChart) OR A paper copy in the mail If you have any lab test that is abnormal or we need to change your treatment, we will call you to review the results.   Follow-Up: At [facility], you and your health needs are our priority.  As part of our continuing mission to provide you with exceptional heart care, we have created designated Provider Care Teams.  These Care Teams include your primary Cardiologist (physician) and Advanced Practice Providers (APPs -  Physician Assistants and Nurse Practitioners) who all work together to provide you with the care you need, when you need it.  We recommend signing up for the patient portal called "MyChart".  Sign up information is provided on this After Visit Summary.  MyChart is used to connect with patients for Virtual Visits (Telemedicine).  Patients are able to view lab/test results, encounter notes, upcoming appointments, etc.  Non-urgent messages can be sent to your provider as well.   To learn more about what you can do with MyChart, go to [email or web address].    Your next appointment:   As scheduled with Alver Sorrow, NP and Dr. Duke Salvia  Other Instructions  Bring your pill bottles and blood pressure cuff to your next office visit.  Tips to Measure your Blood Pressure Correctly  Here's what you can do to ensure a correct reading:  Don't drink a caffeinated beverage or smoke during the 30 minutes before the test.  Sit quietly for five minutes before the test begins.  During the measurement, sit in a  chair with your feet on the floor and your arm supported so your elbow is at about heart level.  The inflatable part of the cuff should completely cover at least 80% of your upper arm, and the cuff should be placed on bare skin, not over a shirt.  Don't talk during the measurement.  Blood pressure categories  Blood pressure category SYSTOLIC (upper number)  DIASTOLIC (lower number)  Normal Less than 120 mm Hg and Less than 80 mm Hg  Elevated 120-129 mm Hg and Less than 80 mm Hg  High blood pressure: Stage 1 hypertension 130-139 mm Hg or 80-89 mm Hg  High blood pressure: Stage 2 hypertension 140 mm Hg or higher or 90 mm Hg or higher  Hypertensive crisis (consult your doctor immediately) Higher than 180 mm Hg and/or Higher than 120 mm Hg  Source: American Heart Association and American Stroke Association. For more on getting your blood pressure under control, buy Controlling Your Blood Pressure, a Special Health Report from [facility].   Blood Pressure Log   Date   Time  Blood Pressure  Example: Nov 1 9 AM 124/78

## 2023-08-07 ENCOUNTER — Other Ambulatory Visit: Payer: Self-pay | Admitting: *Deleted

## 2023-08-07 ENCOUNTER — Encounter: Payer: Self-pay | Admitting: *Deleted

## 2023-08-07 DIAGNOSIS — I160 Hypertensive urgency: Secondary | ICD-10-CM

## 2023-08-07 NOTE — Addendum Note (Signed)
 Addended by: Ricky Stabs on: 08/07/2023 09:43 AM   Modules accepted: Orders

## 2023-08-07 NOTE — Patient Outreach (Signed)
 Care Management   Visit Note  08/07/2023 Name: [name] MRN: [id number] DOB: 16-Dec-1940  Subjective: [name] is a 83 y.o. year old female who is a primary care patient of Mechele Claude, MD. The Care Management team was consulted for assistance.      Engaged with patient spoke with patient by telephone.    Goals Addressed             This Visit's Progress    RNCM Care Management Expected Outcomes: Monitor, Self-Manage, Reduce Symptoms of: COPD,CKD, HTN, Falls       Current Barriers:  Chronic Disease Management support and education needs related to CHF and HTN   RNCM Clinical Goal(s):  Patient will verbalize basic understanding of  HTN, COPD, and CKD Stage 3A disease process and self health management plan as evidenced by verbal explanation, recognizing symptoms, lifestyle modifications attend all scheduled medical appointments: with primary care provider and specialist as evidenced by keeping all scheduled appointments demonstrate Improved and Ongoing adherence to prescribed treatment plan for HTN, COPD, and CKD Stage 3A as evidenced by consistent medication compliance, symptom monitoring, continued lifestyle changes continue to work with RN Care Manager to address care management and care coordination needs related to  HTN, COPD, and CKD Stage 3A as evidenced by adherence to CM Team Scheduled appointments work with Child psychotherapist to address  related to the management of Level of care concerns related to the management of HTN, COPD, and CKD Stage 3A as evidenced by review of EMR and patient or Child psychotherapist report through collaboration with Medical illustrator, provider, and care team.   Interventions: Evaluation of current treatment plan related to  self management and patient's adherence to plan as established by provider    Chronic Kidney Disease Interventions:  (Status:  Goal on track:  Yes.) Long Term Goal Assessed the Patient understanding of chronic kidney  disease. Denies acute any acute needs Evaluation of current treatment plan related to chronic kidney disease self management and patient's adherence to plan as established by provider      Provided education to patient re: stroke prevention, s/s of heart attack and stroke reviewed with patient.  Reviewed medications with patient and discussed importance of compliance. Reports compliance with all medications Counseled on adverse effects of illicit drug and excessive alcohol use in patients with chronic kidney disease. Denies illicit drug or excessive alcohol use.    Counseled on the importance of exercise goals with target of 150 minutes per week     Advised patient, providing education and rationale, to monitor blood pressure daily and record, calling PCP for findings outside established parameters    Discussed complications of poorly controlled blood pressure such as heart disease, stroke, circulatory complications, vision complications, kidney impairment, sexual dysfunction    Reviewed scheduled/upcoming provider appointments including    Advised patient to discuss new changes with provider    Discussed plans with patient for ongoing care management follow up and provided patient with direct contact information for care management team    Screening for signs and symptoms of depression related to chronic disease state      Last practice recorded BP readings:  BP Readings from Last 3 Encounters:  08/06/23 (!) 183/92  08/05/23 (!) 186/69  07/24/23 (!) 150/82   Most recent eGFR/CrCl:  Lab Results  Component Value Date   EGFR 42 (L) 07/29/2023    No components found for: "CRCL"    COPD Interventions:  (Status:  Goal on  track:  Yes.) Long Term Goal Provided patient with basic written and verbal COPD education on self care/management/and exacerbation prevention. Patient reports that she was started on Seroqeul to determine if it was anxiety or COPD exacerbation and she states that it is making  her sleepy. Advised patient to track and manage COPD triggers Provided written and verbal instructions on pursed lip breathing and utilized returned demonstration as teach back Provided instruction about proper use of medications used for management of COPD including inhalers Advised patient to self assesses COPD action plan zone and make appointment with provider if in the yellow zone for 48 hours without improvement Advised patient to engage in light exercise as tolerated 3-5 days a week to aid in the the management of COPD Provided education about and advised patient to utilize infection prevention strategies to reduce risk of respiratory infection Discussed the importance of adequate rest and management of fatigue with COPD Screening for signs and symptoms of depression related to chronic disease state  Assessed social determinant of health barriers   Falls Interventions:  (Status:  Condition stable.  Not addressed this visit.) Long Term Goal Provided written and verbal education re: potential causes of falls and Fall prevention strategies Reviewed medications and discussed potential side effects of medications such as dizziness and frequent urination Advised patient of importance of notifying provider of falls Assessed for signs and symptoms of orthostatic hypotension Assessed for falls since last encounter. No recent falls Assessed patients knowledge of fall risk prevention secondary to previously provided education Assessed working status of life alert bracelet and patient adherence Advised patient to discuss increase in falls/injury with provider Screening for signs and symptoms of depression related to chronic disease state  Assessed social determinant of health barriers   Hypertension Interventions:  (Status:  Goal on track:  NO.) Long Term Goal Last practice recorded BP readings:  BP Readings from Last 3 Encounters:  08/06/23 (!) 183/92  08/05/23 (!) 186/69  07/24/23 (!)  150/82   Most recent eGFR/CrCl:  Lab Results  Component Value Date   EGFR 42 (L) 07/29/2023    No components found for: "CRCL"  Evaluation of current treatment plan related to hypertension self management and patient's adherence to plan as established by provider. Patient reports that she was hypertensive in office yesterday and she was provided with some medication. She has an appointment 09-26-23 with cardiology to evaluate her hypertenion. RNCM advised that patient take her BP twice daily and keep record. She reports BP this morning of 163/79. She denies any chest pain, dizziness or headaches. Provided education to patient re: stroke prevention, s/s of heart attack and stroke. Reinforced signs and symptoms and when to seek help Reviewed medications with patient and discussed importance of compliance. Reports compliance with all medications Counseled on adverse effects of illicit drug and excessive alcohol use in patients with high blood pressure  Counseled on the importance of exercise goals with target of 150 minutes per week Discussed plans with patient for ongoing care management follow up and provided patient with direct contact information for care management team Advised patient, providing education and rationale, to monitor blood pressure daily and record, calling PCP for findings outside established parameters Reviewed scheduled/upcoming provider appointments including: 09-26-23 with Cardiology Advised patient to discuss increased BP home BP readings with provider Provided education on prescribed diet Heart Healthy/Low Sodium Discussed complications of poorly controlled blood pressure such as heart disease, stroke, circulatory complications, vision complications, kidney impairment, sexual dysfunction Screening for signs and  symptoms of depression related to chronic disease state  Assessed social determinant of health barriers  Patient Goals/Self-Care Activities: Take all medications as  prescribed Attend all scheduled provider appointments Call pharmacy for medication refills 3-7 days in advance of running out of medications Attend church or other social activities Perform all self care activities independently  Perform IADL's (shopping, preparing meals, housekeeping, managing finances) independently Call provider office for new concerns or questions  Work with the social worker to address care coordination needs and will continue to work with the clinical team to address health care and disease management related needs identify and remove indoor air pollutants eliminate symptom triggers at home check blood pressure daily write blood pressure results in a log or diary take blood pressure log to all doctor appointments call doctor for signs and symptoms of high blood pressure take medications for blood pressure exactly as prescribed eat more whole grains, fruits and vegetables, lean meats and healthy fats  Follow Up Plan:  Telephone follow up appointment with care management team member scheduled for:  09-06-2023 at 9:00 am           Consent to Services:  Patient was given information about care management services, agreed to services, and gave verbal consent to participate.   Plan: Telephone follow up appointment with care management team member scheduled for:09-06-2023 at 9:00 am  Larey Brick, BSN RN [facility], [facility] RN Care Manager Direct Dial: [phone]  Fax: [phone]

## 2023-08-07 NOTE — Patient Instructions (Signed)
 Visit Information  Thank you for taking time to visit with me today. Please don't hesitate to contact me if I can be of assistance to you before our next scheduled telephone appointment.  Our next appointment is by telephone on 09-06-2023 at 9:00 am  Following is a copy of your care plan:   Goals Addressed             This Visit's Progress    RNCM Care Management Expected Outcomes: Monitor, Self-Manage, Reduce Symptoms of: COPD,CKD, HTN, Falls       Current Barriers:  Chronic Disease Management support and education needs related to CHF and HTN   RNCM Clinical Goal(s):  Patient will verbalize basic understanding of  HTN, COPD, and CKD Stage 3A disease process and self health management plan as evidenced by verbal explanation, recognizing symptoms, lifestyle modifications attend all scheduled medical appointments: with primary care provider and specialist as evidenced by keeping all scheduled appointments demonstrate Improved and Ongoing adherence to prescribed treatment plan for HTN, COPD, and CKD Stage 3A as evidenced by consistent medication compliance, symptom monitoring, continued lifestyle changes continue to work with RN Care Manager to address care management and care coordination needs related to  HTN, COPD, and CKD Stage 3A as evidenced by adherence to CM Team Scheduled appointments work with Child psychotherapist to address  related to the management of Level of care concerns related to the management of HTN, COPD, and CKD Stage 3A as evidenced by review of EMR and patient or Child psychotherapist report through collaboration with Medical illustrator, provider, and care team.   Interventions: Evaluation of current treatment plan related to  self management and patient's adherence to plan as established by provider    Chronic Kidney Disease Interventions:  (Status:  Goal on track:  Yes.) Long Term Goal Assessed the Patient understanding of chronic kidney disease. Denies acute any acute  needs Evaluation of current treatment plan related to chronic kidney disease self management and patient's adherence to plan as established by provider      Provided education to patient re: stroke prevention, s/s of heart attack and stroke reviewed with patient.  Reviewed medications with patient and discussed importance of compliance. Reports compliance with all medications Counseled on adverse effects of illicit drug and excessive alcohol use in patients with chronic kidney disease. Denies illicit drug or excessive alcohol use.    Counseled on the importance of exercise goals with target of 150 minutes per week     Advised patient, providing education and rationale, to monitor blood pressure daily and record, calling PCP for findings outside established parameters    Discussed complications of poorly controlled blood pressure such as heart disease, stroke, circulatory complications, vision complications, kidney impairment, sexual dysfunction    Reviewed scheduled/upcoming provider appointments including    Advised patient to discuss new changes with provider    Discussed plans with patient for ongoing care management follow up and provided patient with direct contact information for care management team    Screening for signs and symptoms of depression related to chronic disease state      Last practice recorded BP readings:  BP Readings from Last 3 Encounters:  08/06/23 (!) 183/92  08/05/23 (!) 186/69  07/24/23 (!) 150/82   Most recent eGFR/CrCl:  Lab Results  Component Value Date   EGFR 42 (L) 07/29/2023    No components found for: "CRCL"    COPD Interventions:  (Status:  Goal on track:  Yes.) Long Term Goal  Provided patient with basic written and verbal COPD education on self care/management/and exacerbation prevention. Patient reports that she was started on Seroqeul to determine if it was anxiety or COPD exacerbation and she states that it is making her sleepy. Advised patient to  track and manage COPD triggers Provided written and verbal instructions on pursed lip breathing and utilized returned demonstration as teach back Provided instruction about proper use of medications used for management of COPD including inhalers Advised patient to self assesses COPD action plan zone and make appointment with provider if in the yellow zone for 48 hours without improvement Advised patient to engage in light exercise as tolerated 3-5 days a week to aid in the the management of COPD Provided education about and advised patient to utilize infection prevention strategies to reduce risk of respiratory infection Discussed the importance of adequate rest and management of fatigue with COPD Screening for signs and symptoms of depression related to chronic disease state  Assessed social determinant of health barriers   Falls Interventions:  (Status:  Condition stable.  Not addressed this visit.) Long Term Goal Provided written and verbal education re: potential causes of falls and Fall prevention strategies Reviewed medications and discussed potential side effects of medications such as dizziness and frequent urination Advised patient of importance of notifying provider of falls Assessed for signs and symptoms of orthostatic hypotension Assessed for falls since last encounter. No recent falls Assessed patients knowledge of fall risk prevention secondary to previously provided education Assessed working status of life alert bracelet and patient adherence Advised patient to discuss increase in falls/injury with provider Screening for signs and symptoms of depression related to chronic disease state  Assessed social determinant of health barriers   Hypertension Interventions:  (Status:  Goal on track:  NO.) Long Term Goal Last practice recorded BP readings:  BP Readings from Last 3 Encounters:  08/06/23 (!) 183/92  08/05/23 (!) 186/69  07/24/23 (!) 150/82   Most recent eGFR/CrCl:   Lab Results  Component Value Date   EGFR 42 (L) 07/29/2023    No components found for: "CRCL"  Evaluation of current treatment plan related to hypertension self management and patient's adherence to plan as established by provider. Patient reports that she was hypertensive in office yesterday and she was provided with some medication. She has an appointment 09-26-23 with cardiology to evaluate her hypertenion. RNCM advised that patient take her BP twice daily and keep record. She reports BP this morning of 163/79. She denies any chest pain, dizziness or headaches. Provided education to patient re: stroke prevention, s/s of heart attack and stroke. Reinforced signs and symptoms and when to seek help Reviewed medications with patient and discussed importance of compliance. Reports compliance with all medications Counseled on adverse effects of illicit drug and excessive alcohol use in patients with high blood pressure  Counseled on the importance of exercise goals with target of 150 minutes per week Discussed plans with patient for ongoing care management follow up and provided patient with direct contact information for care management team Advised patient, providing education and rationale, to monitor blood pressure daily and record, calling PCP for findings outside established parameters Reviewed scheduled/upcoming provider appointments including: 09-26-23 with Cardiology Advised patient to discuss increased BP home BP readings with provider Provided education on prescribed diet Heart Healthy/Low Sodium Discussed complications of poorly controlled blood pressure such as heart disease, stroke, circulatory complications, vision complications, kidney impairment, sexual dysfunction Screening for signs and symptoms of depression related to chronic  disease state  Assessed social determinant of health barriers  Patient Goals/Self-Care Activities: Take all medications as prescribed Attend all scheduled  provider appointments Call pharmacy for medication refills 3-7 days in advance of running out of medications Attend church or other social activities Perform all self care activities independently  Perform IADL's (shopping, preparing meals, housekeeping, managing finances) independently Call provider office for new concerns or questions  Work with the social worker to address care coordination needs and will continue to work with the clinical team to address health care and disease management related needs identify and remove indoor air pollutants eliminate symptom triggers at home check blood pressure daily write blood pressure results in a log or diary take blood pressure log to all doctor appointments call doctor for signs and symptoms of high blood pressure take medications for blood pressure exactly as prescribed eat more whole grains, fruits and vegetables, lean meats and healthy fats  Follow Up Plan:  Telephone follow up appointment with care management team member scheduled for:  09-06-2023 at 9:00 am          Patient verbalizes understanding of instructions and care plan provided today and agrees to view in MyChart. Active MyChart status and patient understanding of how to access instructions and care plan via MyChart confirmed with patient.     Telephone follow up appointment with care management team member scheduled for:  Please call the care guide team at [phone] if you need to cancel or reschedule your appointment.   Please call the Suicide and Crisis Lifeline: 988 call the [place] National Suicide Prevention Lifeline: 574-530-6006 or TTY: 336-133-5715 TTY 825 644 5111) to talk to a trained counselor call 1-800-273-TALK (toll free, 24 hour hotline) if you are experiencing a Mental Health or Behavioral Health Crisis or need someone to talk to.  Larey Brick, BSN RN [facility], [facility] RN Care Manager Direct Dial:  [phone]  Fax: [phone]

## 2023-08-08 ENCOUNTER — Telehealth (HOSPITAL_BASED_OUTPATIENT_CLINIC_OR_DEPARTMENT_OTHER): Payer: Self-pay | Admitting: Family

## 2023-08-08 NOTE — Telephone Encounter (Signed)
 During clinic visit 08/06/23 noted 07/11/23 LDL 113 despite taking Atorvastatin 80mg  daily as prescribed. Previously prescribed Zetia and Repatha but had difficulty with medication adherence in setting of polypharmacy. Discussed with PharmD team, given CAD would qualify for Leqvio.   Will have her sign form to initiate coverage for Leqvio at upcoming OV 08/20/23. Added to appt notes as a reminder.   Alver Sorrow, NP

## 2023-08-09 ENCOUNTER — Other Ambulatory Visit: Payer: Self-pay | Admitting: Family Medicine

## 2023-08-09 DIAGNOSIS — J441 Chronic obstructive pulmonary disease with (acute) exacerbation: Secondary | ICD-10-CM

## 2023-08-13 ENCOUNTER — Telehealth: Payer: Self-pay

## 2023-08-13 LAB — ALDOSTERONE + RENIN ACTIVITY W/ RATIO
Aldos/Renin Ratio: <6.8 (ref 0.0–30.0)
Aldosterone: <2 ng/dL (ref 0.0–30.0)
Renin Activity, Plasma: 0.294 ng/mL/h (ref 0.167–5.380)

## 2023-08-13 LAB — BASIC METABOLIC PANEL
BUN/Creatinine Ratio: 16 (ref 12–28)
BUN: 16 mg/dL (ref 8–27)
CO2: 24 mmol/L (ref 20–29)
Calcium: 9.2 mg/dL (ref 8.7–10.3)
Chloride: 104 mmol/L (ref 96–106)
Creatinine, Ser: 1.02 mg/dL — ABNORMAL HIGH (ref 0.57–1.00)
Glucose: 97 mg/dL (ref 70–99)
Potassium: 3.7 mmol/L (ref 3.5–5.2)
Sodium: 142 mmol/L (ref 134–144)
eGFR: 56 mL/min/{1.73_m2} — ABNORMAL LOW (ref >59)

## 2023-08-13 LAB — TSH: TSH: 3.85 u[IU]/mL (ref 0.450–4.500)

## 2023-08-13 NOTE — Progress Notes (Signed)
 Care Guide Pharmacy Note  08/13/2023 Name: [name] MRN: [id number] DOB: May 21, 1941  Referred By: Mechele Claude, MD Reason for referral: Care Coordination (Outreach to schedule with Pharm d )   [name] is a 83 y.o. year old female who is a primary care patient of Stacks, Broadus John, MD.  [name] was referred to the pharmacist for assistance related to: HTN  Successful contact was made with the patient to discuss pharmacy services including being ready for the pharmacist to call at least 5 minutes before the scheduled appointment time and to have medication bottles and any blood pressure readings ready for review. The patient agreed to meet with the pharmacist via telephone visit on (date/time).09/10/2023  Penne Lash , RMA       [facility], [facility] Guide  Direct Dial: [phone]  Website: [email or web address]

## 2023-08-14 ENCOUNTER — Other Ambulatory Visit: Payer: Self-pay | Admitting: Family Medicine

## 2023-08-14 DIAGNOSIS — J441 Chronic obstructive pulmonary disease with (acute) exacerbation: Secondary | ICD-10-CM

## 2023-08-15 ENCOUNTER — Telehealth (HOSPITAL_BASED_OUTPATIENT_CLINIC_OR_DEPARTMENT_OTHER): Payer: Self-pay

## 2023-08-15 ENCOUNTER — Ambulatory Visit: Payer: Self-pay | Admitting: *Deleted

## 2023-08-15 NOTE — Telephone Encounter (Addendum)
 Results called to patient who verbalizes understanding!    ----- Message from Levi Aland sent at 08/15/2023  6:46 AM EDT ----- [name], I am one of the nurse practitioners that works with [name], covering for her in her absence. Your lab results are stable. [name] will review with you in detail and make advisement regarding your medications at your appointment next week.

## 2023-08-15 NOTE — Patient Instructions (Signed)
 Visit Information  Thank you for taking time to visit with me today. Please don't hesitate to contact me if I can be of assistance to you.   Following are the goals we discussed today:   Goals Addressed               This Visit's Progress     COMPLETED: Receive Assistance Obtaining Personal Care Services. (pt-stated)   On track     Care Coordination Interventions:  Interventions Today    Flowsheet Row Most Recent Value  Chronic Disease   Chronic disease during today's visit Chronic Obstructive Pulmonary Disease (COPD), Hypertension (HTN), Chronic Kidney Disease/End Stage Renal Disease (ESRD), Other  [Inability to Perform Activities of Daily Living Independently, Recurrent Depression, Cigarette Nicotine Dependence, Generalized Anxiety Disorder & History of Transient Ischemic Attack, Frequent Falls, Currently Hospitalized.]  General Interventions   General Interventions Discussed/Reviewed General Interventions Discussed, General Interventions Reviewed, Doctor Visits, Communication with, [employer], Level of Care  [Encouraged Routine Engagement with Care Team Members & Providers.]  Doctor Visits Discussed/Reviewed Doctor Visits Discussed, Specialist, Doctor Visits Reviewed, Annual Wellness Visits, PCP  [Encouraged Routine Engagement with Care Team Members & Providers.]  PCP/Specialist Visits Compliance with follow-up visit  [Encouraged Routine Engagement with Care Team Members & Providers.]  Communication with PCP/Specialists, Charity fundraiser, Pharmacists, Social Work  [employer] Routine Engagement with Care Team Members & Providers.]  Level of Care Adult Daycare, Assisted Living, Skilled Nursing Facility  [Confirmed Disinterest in Enrollment in Adult Day Care Program. Confirmed Disinterest in Pursuing Higher Level of Care Placement Options.]  Applications Medicaid, Personal Care Services  [Confirmed Disinterest in Applying for Medicaid or Personal Care Services.]  Exercise Interventions    Exercise Discussed/Reviewed Exercise Discussed, Exercise Reviewed, Physical Activity, Weight Managment, Assistive device use and maintanence  [Encouraged Routine Use of Assistive Devices & Durable Medical Equipment.]  Physical Activity Discussed/Reviewed Physical Activity Discussed, Physical Activity Reviewed, Types of exercise, Home Exercise Program (HEP), PREP, Gym  [Encouraged Daily Exercise Regimen, as Tolerated.]  Education Interventions   Education Provided Provided Education  [employer] Reviewed Educational Material & Encouraged Consideration & Implementation.]  Provided Verbal Education On When to see the doctor, Mental Health/Coping with Illness, Programmer, applications, Exercise  [Encouraged Routine Engagement with Care Team Members & Providers.]  Ship broker, Personal Care Services  [Confirmed Disinterest in Applying for Medicaid or Personal Care Services.]  Mental Health Interventions   Mental Health Discussed/Reviewed Mental Health Discussed, Anxiety, Depression, Grief and Loss, Mental Health Reviewed, Substance Abuse, Coping Strategies, Suicide, Crisis, Other  [Assessed Mental Health & Cognitive Status.]  Safety Interventions   Safety Discussed/Reviewed Safety Discussed, Safety Reviewed, Fall Risk, Home Safety  [Encouraged Consideration of Obtaining a Teaching laboratory technician for Home Use.]  Home Safety Assistive Devices, Need for home safety assessment, Contact provider for referral to PT/OT, Contact home health agency, Refer for community resources, Refer for home visit  [Encouraged Consideration of Home Health Physicial & Occupational Therapy Services.]  Advanced Directive Interventions   Advanced Directives Discussed/Reviewed Advanced Directives Discussed, Advanced Directives Reviewed  [Encouraged Initiation of Advanced Directives (Living Will & Healthcare Power of Corporate treasurer), Offering to [employer], Assist with Completion, Make Copies, & Scan into Electronic Medical Record in  Epic.]      Active Listening & Reflection Utilized.  Verbalization of Feelings Encouraged.  Emotional Support Provided. Acceptance & Commitment Therapy Initiated. Cognitive Behavioral Therapy Performed. Client-Centered Therapy Indicated. Encouraged Engagement with [name], Licensed Clinical Social Worker with [facility], [facility] (#  [phone]), if You Have Questions, Need Assistance, Additional Social Work Needs Are Identified in The Near Future, or If You Change Your Mind About Wanting to Receive Social Work Services.      Please call the care guide team at [phone] if you need to cancel or reschedule your appointment.   If you are experiencing a Mental Health or Behavioral Health Crisis or need someone to talk to, please call the Suicide and Crisis Lifeline: 988 call the [place] National Suicide Prevention Lifeline: 660-765-4846 or TTY: (231)565-0885 TTY 936-578-2811) to talk to a trained counselor call 1-800-273-TALK (toll free, 24 hour hotline) go to [facility] [address], [place] [phone]) call the [facility] Crisis Line: [phone] call 911  Patient verbalizes understanding of instructions and care plan provided today and agrees to view in MyChart. Active MyChart status and patient understanding of how to access instructions and care plan via MyChart confirmed with patient.     No further follow up required.  [name], BSW, MSW, LCSW [facility], [facility] Clinical Social Worker II Direct Dial: [phone]  Fax: [phone] Website: [email or web address]

## 2023-08-15 NOTE — Patient Outreach (Signed)
 Care Coordination   Follow Up Visit Note   08/15/2023  Name: [name] MRN: [id number] DOB: 07-25-1940  [name] is a 83 y.o. year old female who sees Stacks, Broadus John, MD for primary care. I spoke with [name] by phone today.  What matters to the patients health and wellness today?  Receive Assistance Obtaining Personal Care Services.    Goals Addressed               This Visit's Progress     COMPLETED: Receive Assistance Obtaining Personal Care Services. (pt-stated)   On track     Care Coordination Interventions:  Interventions Today    Flowsheet Row Most Recent Value  Chronic Disease   Chronic disease during today's visit Chronic Obstructive Pulmonary Disease (COPD), Hypertension (HTN), Chronic Kidney Disease/End Stage Renal Disease (ESRD), Other  [Inability to Perform Activities of Daily Living Independently, Recurrent Depression, Cigarette Nicotine Dependence, Generalized Anxiety Disorder & History of Transient Ischemic Attack, Frequent Falls, Currently Hospitalized.]  General Interventions   General Interventions Discussed/Reviewed General Interventions Discussed, General Interventions Reviewed, Doctor Visits, Communication with, [employer], Level of Care  [Encouraged Routine Engagement with Care Team Members & Providers.]  Doctor Visits Discussed/Reviewed Doctor Visits Discussed, Specialist, Doctor Visits Reviewed, Annual Wellness Visits, PCP  [Encouraged Routine Engagement with Care Team Members & Providers.]  PCP/Specialist Visits Compliance with follow-up visit  [Encouraged Routine Engagement with Care Team Members & Providers.]  Communication with PCP/Specialists, Charity fundraiser, Pharmacists, Social Work  [employer] Routine Engagement with Care Team Members & Providers.]  Level of Care Adult Daycare, Assisted Living, Skilled Nursing Facility  [Confirmed Disinterest in Enrollment in Adult Day Care Program. Confirmed Disinterest in Pursuing Higher Level of  Care Placement Options.]  Applications Medicaid, Personal Care Services  [Confirmed Disinterest in Applying for Medicaid or Personal Care Services.]  Exercise Interventions   Exercise Discussed/Reviewed Exercise Discussed, Exercise Reviewed, Physical Activity, Weight Managment, Assistive device use and maintanence  [Encouraged Routine Use of Assistive Devices & Durable Medical Equipment.]  Physical Activity Discussed/Reviewed Physical Activity Discussed, Physical Activity Reviewed, Types of exercise, Home Exercise Program (HEP), PREP, Gym  [Encouraged Daily Exercise Regimen, as Tolerated.]  Education Interventions   Education Provided Provided Education  [employer] Reviewed Educational Material & Encouraged Consideration & Implementation.]  Provided Verbal Education On When to see the doctor, Mental Health/Coping with Illness, Programmer, applications, Exercise  [Encouraged Routine Engagement with Care Team Members & Providers.]  Ship broker, Personal Care Services  [Confirmed Disinterest in Applying for Medicaid or Personal Care Services.]  Mental Health Interventions   Mental Health Discussed/Reviewed Mental Health Discussed, Anxiety, Depression, Grief and Loss, Mental Health Reviewed, Substance Abuse, Coping Strategies, Suicide, Crisis, Other  [Assessed Mental Health & Cognitive Status.]  Safety Interventions   Safety Discussed/Reviewed Safety Discussed, Safety Reviewed, Fall Risk, Home Safety  [Encouraged Consideration of Obtaining a Teaching laboratory technician for Home Use.]  Home Safety Assistive Devices, Need for home safety assessment, Contact provider for referral to PT/OT, Contact home health agency, Refer for community resources, Refer for home visit  [Encouraged Consideration of Home Health Physicial & Occupational Therapy Services.]  Advanced Directive Interventions   Advanced Directives Discussed/Reviewed Advanced Directives Discussed, Advanced Directives Reviewed  [Encouraged Initiation of  Advanced Directives (Living Will & Healthcare Power of Corporate treasurer), Offering to [employer], Assist with Completion, Make Copies, & Scan into Electronic Medical Record in Epic.]      Active Listening & Reflection Utilized.  Verbalization of Feelings Encouraged.  Emotional Support Provided.  Acceptance & Commitment Therapy Initiated. Cognitive Behavioral Therapy Performed. Client-Centered Therapy Indicated. Encouraged Engagement with [name], Licensed Clinical Social Worker with [facility], [facility] [phone]), if You Have Questions, Need Assistance, Additional Social Work Needs Are Identified in The Near Future, or If You Change Your Mind About Wanting to Receive Social Work Services.      SDOH assessments and interventions completed:  Yes.  Care Coordination Interventions:  Yes, provided.   Follow up plan: No further intervention required.   Encounter Outcome:  Patient Visit Completed.

## 2023-08-20 ENCOUNTER — Encounter (HOSPITAL_BASED_OUTPATIENT_CLINIC_OR_DEPARTMENT_OTHER): Payer: Self-pay | Admitting: Family

## 2023-08-20 ENCOUNTER — Ambulatory Visit (INDEPENDENT_AMBULATORY_CARE_PROVIDER_SITE_OTHER): Admitting: Family

## 2023-08-20 VITALS — BP 182/90 | HR 76 | Ht 61.0 in | Wt 130.1 lb

## 2023-08-20 DIAGNOSIS — E785 Hyperlipidemia, unspecified: Secondary | ICD-10-CM

## 2023-08-20 DIAGNOSIS — I1A0 Resistant hypertension: Secondary | ICD-10-CM | POA: Diagnosis not present

## 2023-08-20 DIAGNOSIS — I2510 Atherosclerotic heart disease of native coronary artery without angina pectoris: Secondary | ICD-10-CM

## 2023-08-20 DIAGNOSIS — Z8673 Personal history of transient ischemic attack (TIA), and cerebral infarction without residual deficits: Secondary | ICD-10-CM

## 2023-08-20 MED ORDER — HYDRALAZINE HCL 100 MG PO TABS: 100.0000 mg | ORAL_TABLET | Freq: Three times a day (TID) | ORAL | 3 refills | Status: DC

## 2023-08-20 NOTE — Patient Instructions (Signed)
 Medication Instructions:  Your physician has recommended you make the following change in your medication:   Change: Hydralazine 100mg  three times per day   Follow-Up: At [facility], you and your health needs are our priority.  As part of our continuing mission to provide you with exceptional heart care, we have created designated Provider Care Teams.  These Care Teams include your primary Cardiologist (physician) and Advanced Practice Providers (APPs -  Physician Assistants and Nurse Practitioners) who all work together to provide you with the care you need, when you need it.  We recommend signing up for the patient portal called "MyChart".  Sign up information is provided on this After Visit Summary.  MyChart is used to connect with patients for Virtual Visits (Telemedicine).  Patients are able to view lab/test results, encounter notes, upcoming appointments, etc.  Non-urgent messages can be sent to your provider as well.   To learn more about what you can do with MyChart, go to [email or web address].    Your next appointment:   Follow up as scheduled

## 2023-08-20 NOTE — Progress Notes (Signed)
 Cardiology Office Note:  .   Date:  08/20/2023  ID:  [name], DOB 02-25-1941, MRN [id number] PCP: Mechele Claude, MD  [facility] Providers Cardiologist:  Chilton Si, MD    History of Present Illness: [name]   [name] is a 83 y.o. female  with a hx of CAD, hyperlipidemia, penetrating aortic ulcer, CVA, COPD, hypertension, CKD 3A, GERD.   Established with Dr. Duke Salvia of 03/19/2022 due to atypical chest pain.  Cardiac CTA ordered and performed 03/29/2022 calcium score 396 placing her in the 85th percentile for age/sex/race matched control.  Moderate nonobstructive disease with no significant lesion by FFR.   Admitted 10/29 - 03/27/2022 after presenting with COPD exacerbation as well as AKI.  Her ARB and HCTZ were held.     When seen 04/17/2022 hydralazine 25 mg twice daily initiated and later increased.  At visit 07/04/2022 hydrochlorothiazide 12.5 mg was added to her valsartan/HCTZ 160/12.5 mg QD.   Admitted 4/21 - 09/20/2022 with chest discomfort, hypertensive urgency.  Blood pressure initially up to 237.  Started on nicardipine drip.  CTA chest/abdomen/pelvis showed partially calcified atherosclerotic plaque with focal stenosis of the left renal artery with moderate atrophy of the left kidney.  Evaluated by [facility] Dr. Chestine Spore with good celiac trunk and SMA flow recommended to follow-up regarding renal stenosis in outpatient setting.  Valsartan and hydrochlorothiazide were discontinued due to renal function.  She was discharged on  amlodipine 10 mg daily, hydralazine 50 mg TID, isosorbide dinitrate 20 mg TID,  bisoprolol 5 mg daily.   At visit 10/15/22,  Zetia was added as LDL not at goal.   ED visit 12/07/22 with elevated BP which improved to 130 systolic without intervention.  CXR showed interstitial edema and she was given dose of Lasix. New age indeterminate infarct in left banglia CVA.  No focal neurological deficits noted.  At visit 12/31/2022 due to palpitations and new  finding of age-indeterminate infarct of left basal ganglia (by CT 12/07/22) by CT ZIO was placed but did not reveal atrial fibrillation.  LDL remained above goal of less than 70 she was referred to Pharm.D. lipid clinic and started on Repatha.  At visit 05/06/2023 with elevated BP, Isordil transitioned to Imdur. She was not taking Zetia, Repatha, nor Atorvastatin but agreeable to resume Atorvastatin. PCP had reduced Furosemide and Metoprolol dose with improvement in energy level, reduced doses were continued. Amlodipine and Olmesartan transition amlodipine-olmesartan.  Clonidine 0.1 mg patch subsequently added for BP control.PCP discontineud Metoprolol 04/2023 due to bradycardia fatigue.   Seen 06/11/23. She again was not taking cholesterol medications and Atorvastatin resumed. BP reasonably controlled, regimen of Amlodipine-Olmesartan 10-40mg  daily, Imdur 60mg  daily, Clonidine 0.1mg  patch weekly,  and Lasix 20mg  daily continued.   She started having flulike symptoms 07/05/23. She went to [facility] 07/10/23. Troponin 57 ? 366 ? 551. ProBNP 637. EKG with progressive TWI in anterior leads. She was transferred to [facility] and had LHC with nonobstructive disease. Due to elevated ESR and CRP, she was started on colchicine and NSAIDs for pericarditis.   At visit 08/06/23 Hydralazine 50mg  TID resumed for BP control. Amlodipine-Olmesartan 10-40mg  QAM, Imdur 60mg  QHS, Clonidine 0.1mg  patch weekly, Lasix 20mg  QAM were continued. Renin aldosterone and TSH unremarkable.  She presents today for follow up independently. Anxiety improved since addition of Seroquel by primary care provider. Will notice dyspnea with rushed activity or if someone makes her mad. Endorses taking her cardiac medications routinely at 6AM, mid afternoon, 6:30PM. Cares  for her husband with diabetes. BP at home 160/83, 122/51, 143/83, 150/83, 150/50, 105/96, 191/132, 175/83 with Average 149/82.   Medication regimen Changes Clondine patch on  Fridays AM: Amlodipine-Olmesartan, Lasix, Hydralazine Afternoon: Hydralazine PM: Isosorbide, Hydralazine  Prior antihypertensives Metoprolol - fatigue Bisoprolol Hydrochlorothiazide Lisinopril - changed to ARB Clonidine tablet - transitioned to patch Valsartan Losartan  ROS: Please see the history of present illness.    All other systems reviewed and are negative.   Studies Reviewed: .             Risk Assessment/Calculations:     HYPERTENSION CONTROL Vitals:   08/20/23 0849 08/20/23 0901 08/20/23 0906  BP: (!) 200/90 (!) 149/82 (!) 182/90    The patient's blood pressure is elevated above target today.  In order to address the patient's elevated BP: A current anti-hypertensive medication was adjusted today.          Physical Exam:   VS:  BP (!) 182/90   Pulse 76   Ht 5\' 1"  (1.549 m)   Wt 130 lb 1.6 oz (59 kg)   BMI 24.58 kg/m    Wt Readings from Last 3 Encounters:  08/20/23 130 lb 1.6 oz (59 kg)  08/06/23 127 lb 12.8 oz (58 kg)  08/05/23 128 lb (58.1 kg)    Vitals:   08/20/23 0849 08/20/23 0901 08/20/23 0906  BP: (!) 200/90 (!) 149/82 Comment: average home BP (!) 182/90  Pulse: 76    Height: 5\' 1"  (1.549 m)    Weight: 130 lb 1.6 oz (59 kg)    BMI (Calculated): 24.59     GEN: Well nourished, well developed in no acute distress NECK: No JVD; No carotid bruits CARDIAC: RRR, no murmurs, rubs, gallops RESPIRATORY:  Clear to auscultation without rales, wheezing or rhonchi  ABDOMEN: Soft, non-tender, non-distended EXTREMITIES:  No edema; No deformity   ASSESSMENT AND PLAN: .    Pericarditis -Admitted 07/10/2023 with pericarditis setting of flu  Completed a course of ibuprofen.  Continue colchicine 0.6 mg daily for 3 months then discontinue.  She has not had recurrent chest discomfort. Colchicine stop date 10/13/23.   Nonobstructive CAD/HLD, LDL goal <70- CTA 03/29/2022 with coronary calcium score of 396 placing in the 85th percentile.  LHC 07/11/2023 with  proximal RCA 50%, mid RCA 40%, D1 30% stenosis with moderate nonobstructive CAD. Stable with no anginal symptoms. No indication for ischemic evaluation.   GDMT Plavix 75 mg daily, atorvastatin 80 mg daily.  Metoprolol previously discontinued due to fatigue. 07/11/23 LDL 113. Reports taking Atorvastatin 80mg  daily as prescribed. Previously prescribed Zetia and Repatha but had difficulty with medication adherence in setting of polypharmacy. Prior auth for [employer] initiated today.   HTN - Initially diagnosed in 1987 in setting of CVA. BP at home 160/83, 122/51, 143/83, 150/83, 150/50, 105/96, 191/132, 175/83 with Average 149/82. BP in clinic 200/90 ? 182/90.  Medication management: Increase Hydralazine 100mg  TID.  Metoprolol previously discontinued due to fatige. Continue Amlodipine-Olmesartan 10-40mg  QAM, Imdur 60mg  QHS, Clonidine 0.1mg  patch weekly, Lasix 20mg  QAM.  Discussed to monitor BP at home at least 2 hours after medications and sitting for 5-10 minutes. Bring pill bottles and BP cuff to next OV. Future considerations: increased dose Imdur vs addition Spironolactone Secondary Hypertension Workup: 04/02/23 renal duplex with no evidence of high grade left renal artery stenosis. Prior CTA plaque at left renal artery origin. No intervention recommended. Recommended for repeat duplex and [facility] follow up in 1 year. HTN not felt to be  driven by RAS.  08/2022 and 02/2023 CT normal adrenals - no evidence of pheochromocytoma. 10/2022 and 08/06/23 normal TSH 08/06/23 Renin-aldosterone unremarkable Consider participation in [place] trial when phase 2 starts   PAD / RAS - Follows with Dr. Chestine Spore. No new claudication symptoms.   Hx of CVA - Continue plavix 75mg  daily, atorvastatin 80mg  daily. Prior monitor with no atrial fib.   CKDIIIa- Careful titration of diuretic and antihypertensive.    COPD/Tobacco use- Follows with pulmonology. No signs of acute exacerbation. Congratulated on continued cessation. Per  07/24/23 pulmonology note DOE felt to be related to COPD.        Dispo: follow up as scheduled Dr. Duke Salvia [facility]   Signed, Alver Sorrow, NP

## 2023-08-26 ENCOUNTER — Telehealth: Payer: Self-pay

## 2023-08-26 NOTE — Telephone Encounter (Signed)
 Copied from CRM [id number]. Topic: General - Other >> Aug 26, 2023  3:44 PM Yolanda T wrote: Reason for CRM: Erie Noe from [facility] called to give blood pressure/heart rate readings from 3/24-3/30. 3/30 - 165/77 70, 3/29; 162/97 79; 3/28 - 165/76 70;  3/27 - 162/97 70; 3/26 - 162/97 96; 3/25 - 175/83 79 and 3/24 106/96 81.

## 2023-09-01 ENCOUNTER — Other Ambulatory Visit: Payer: Self-pay | Admitting: Family Medicine

## 2023-09-01 DIAGNOSIS — J441 Chronic obstructive pulmonary disease with (acute) exacerbation: Secondary | ICD-10-CM

## 2023-09-05 ENCOUNTER — Ambulatory Visit: Admitting: Family Medicine

## 2023-09-05 ENCOUNTER — Encounter: Payer: Self-pay | Admitting: Family Medicine

## 2023-09-05 VITALS — BP 156/75 | HR 89 | Temp 98.1°F | Ht 61.0 in | Wt 124.0 lb

## 2023-09-05 DIAGNOSIS — N1831 Chronic kidney disease, stage 3a: Secondary | ICD-10-CM | POA: Diagnosis not present

## 2023-09-05 DIAGNOSIS — M549 Dorsalgia, unspecified: Secondary | ICD-10-CM | POA: Diagnosis not present

## 2023-09-05 DIAGNOSIS — M5416 Radiculopathy, lumbar region: Secondary | ICD-10-CM | POA: Diagnosis not present

## 2023-09-05 DIAGNOSIS — I10 Essential (primary) hypertension: Secondary | ICD-10-CM

## 2023-09-05 DIAGNOSIS — M5430 Sciatica, unspecified side: Secondary | ICD-10-CM | POA: Diagnosis not present

## 2023-09-05 MED ORDER — PREGABALIN 50 MG PO CAPS: ORAL_CAPSULE | ORAL | 0 refills | Status: DC

## 2023-09-05 MED ORDER — POTASSIUM CHLORIDE CRYS ER 20 MEQ PO TBCR: 20.0000 meq | EXTENDED_RELEASE_TABLET | Freq: Every day | ORAL | 5 refills | Status: DC

## 2023-09-05 NOTE — Progress Notes (Signed)
 Subjective:  Patient ID: [name], female    DOB: 09/24/1940  Age: 83 y.o. MRN: [id number]  CC: Follow-up (Hip pain that goes all the way down legs. Pain getting worse. Pain and numbness almost makes her fall. )   HPI [name] presents for  presents for  follow-up of hypertension. Patient has no history of headache chest pain or shortness of breath or recent cough. Patient also denies symptoms of TIA such as focal numbness or weakness. Patient denies side effects from medication. States taking it regularly. Home systolic readings run around 150's also.  More joint pain since stopping meloxicam. Also burning at plantar right heel. Back pain runs down the left posterior thigh.     09/05/2023    9:10 AM 08/05/2023    9:18 AM 07/22/2023    1:03 PM  Depression screen PHQ 2/9  Decreased Interest 0 0 0  Down, Depressed, Hopeless 1 1 0  PHQ - 2 Score 1 1 0  Altered sleeping 1 0 1  Tired, decreased energy 1 2 2   Change in appetite 3 1 0  Feeling bad or failure about yourself  0 0 0  Trouble concentrating 0 0 0  Moving slowly or fidgety/restless 1 0 1  Suicidal thoughts 0 0 0  PHQ-9 Score 7 4 4   Difficult doing work/chores Somewhat difficult Somewhat difficult Not difficult at all    History [name] has a past medical history of Acute idiopathic pericarditis (07/15/2023), Agatston coronary artery calcium score between 200 and 399 (07/15/2023), Arthritis, Atypical chest pain (03/19/2022), CHF (congestive heart failure) (HCC), GERD (gastroesophageal reflux disease), Headache(784.0), Hepatitis, History of gout, Hypertension, Hypertensive encephalopathy, 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   She has a past surgical history that includes Abdominal hysterectomy; Anterior cervical corpectomy (03/07/2012); Tonsillectomy; Multiple tooth extractions; Cataract extraction w/PHACO (Left, 04/12/2017); Cataract  extraction w/PHACO (Right, 05/03/2017); and LEFT HEART CATH AND CORONARY ANGIOGRAPHY (N/A, 07/11/2023).   Her family history includes Asthma in her daughter; Bipolar disorder in her daughter, daughter, daughter, daughter, and son; Drug abuse in her daughter; Heart disease in her daughter and daughter; Hypertension in her daughter. She was adopted.She reports that she quit smoking about 17 months ago. Her smoking use included cigarettes. She started smoking about 63 years ago. She has a 30.9 pack-year smoking history. She has been exposed to tobacco smoke. She has never used smokeless tobacco. She reports that she does not currently use alcohol. She reports that she does not use drugs.    ROS Review of Systems  Constitutional: Negative.   HENT: Negative.    Eyes:  Negative for visual disturbance.  Respiratory:  Negative for shortness of breath.   Cardiovascular:  Negative for chest pain.  Gastrointestinal:  Negative for abdominal pain.  Musculoskeletal:  Positive for arthralgias (worse since stopping the meloxicam).  Neurological:        Heel burning on right, onset when se stopped meloxicam     Objective:  BP (!) 156/75   Pulse 89   Temp 98.1 F (36.7 C)   Ht 5\' 1"  (1.549 m)   Wt 124 lb (56.2 kg)   SpO2 92%   BMI 23.43 kg/m   BP Readings from Last 3 Encounters:  09/05/23 (!) 156/75  08/20/23 (!) 182/90  08/06/23 (!) 183/92    Wt Readings from Last 3 Encounters:  09/05/23 124 lb (56.2 kg)  08/20/23 130 lb 1.6 oz (59 kg)  08/06/23 127  lb 12.8 oz (58 kg)     Physical Exam Constitutional:      General: She is not in acute distress.    Appearance: She is well-developed.  Cardiovascular:     Rate and Rhythm: Normal rate and regular rhythm.  Pulmonary:     Breath sounds: Normal breath sounds.  Musculoskeletal:        General: Tenderness (left lumbar and right plantar heel) present. Normal range of motion.  Skin:    General: Skin is warm and dry.  Neurological:      Mental Status: She is alert and oriented to person, place, and time.      Assessment & Plan:  Accelerated hypertension  Back pain with sciatica  Stage 3a chronic kidney disease (HCC)  Lumbar radiculopathy  Other orders -     Pregabalin; 1 qhs X7 days , then 2 qhs X 7d, then 3 qhs X 7d, then 4 qhs  Dispense: 120 capsule; Refill: 0 -     Potassium Chloride Crys ER; Take 1 tablet (20 mEq total) by mouth daily. For potassium replacement/ supplement  Dispense: 30 tablet; Refill: 5    DASH printed  Follow-up: Return in about 1 month (around 10/05/2023) for Pain, hypertension.  Mechele Claude, M.D.

## 2023-09-05 NOTE — Patient Instructions (Signed)
 DASH Eating Plan DASH stands for Dietary Approaches to Stop Hypertension. The DASH eating plan is a healthy eating plan that has been shown to: Lower high blood pressure (hypertension). Reduce your risk for type 2 diabetes, heart disease, and stroke. Help with weight loss. What are tips for following this plan? Reading food labels Check food labels for the amount of salt (sodium) per serving. Choose foods with less than 5 percent of the Daily Value (DV) of sodium. In general, foods with less than 300 milligrams (mg) of sodium per serving fit into this eating plan. To find whole grains, look for the word "whole" as the first word in the ingredient list. Shopping Buy products labeled as "low-sodium" or "no salt added." Buy fresh foods. Avoid canned foods and pre-made or frozen meals. Cooking Try not to add salt when you cook. Use salt-free seasonings or herbs instead of table salt or sea salt. Check with your health care provider or pharmacist before using salt substitutes. Do not fry foods. Cook foods in healthy ways, such as baking, boiling, grilling, roasting, or broiling. Cook using oils that are good for your heart. These include olive, canola, [name], soybean, and sunflower oil. Meal planning  Eat a balanced diet. This should include: 4 or more servings of fruits and 4 or more servings of vegetables each day. Try to fill half of your plate with fruits and vegetables. 6-8 servings of whole grains each day. 6 or less servings of lean meat, poultry, or fish each day. 1 oz is 1 serving. A 3 oz (85 g) serving of meat is about the same size as the palm of your hand. One egg is 1 oz (28 g). 2-3 servings of low-fat dairy each day. One serving is 1 cup (237 mL). 1 serving of nuts, seeds, or beans 5 times each week. 2-3 servings of heart-healthy fats. Healthy fats called omega-3 fatty acids are found in foods such as walnuts, flaxseeds, fortified milks, and eggs. These fats are also found in  cold-water fish, such as sardines, salmon, and mackerel. Limit how much you eat of: Canned or prepackaged foods. Food that is high in trans fat, such as fried foods. Food that is high in saturated fat, such as fatty meat. Desserts and other sweets, sugary drinks, and other foods with added sugar. Full-fat dairy products. Do not salt foods before eating. Do not eat more than 4 egg yolks a week. Try to eat at least 2 vegetarian meals a week. Eat more home-cooked food and less restaurant, buffet, and fast food. Lifestyle When eating at a restaurant, ask if your food can be made with less salt or no salt. If you drink alcohol: Limit how much you have to: 0-1 drink a day if you are female. 0-2 drinks a day if you are female. Know how much alcohol is in your drink. In the [place] one drink is one 12 oz bottle of beer (355 mL), one 5 oz glass of wine (148 mL), or one 1 oz glass of hard liquor (44 mL). General information Avoid eating more than 2,300 mg of salt a day. If you have hypertension, you may need to reduce your sodium intake to 1,500 mg a day. Work with your provider to stay at a healthy body weight or lose weight. Ask what the best weight range is for you. On most days of the week, get at least 30 minutes of exercise that causes your heart to beat faster. This may include walking, swimming, or  biking. Work with your provider or dietitian to adjust your eating plan to meet your specific calorie needs. What foods should I eat? Fruits All fresh, dried, or frozen fruit. Canned fruits that are in their natural juice and do not have sugar added to them. Vegetables Fresh or frozen vegetables that are raw, steamed, roasted, or grilled. Low-sodium or reduced-sodium tomato and vegetable juice. Low-sodium or reduced-sodium tomato sauce and tomato paste. Low-sodium or reduced-sodium canned vegetables. Grains Whole-grain or whole-wheat bread. Whole-grain or whole-wheat pasta. Brown rice. [name]. Bulgur. Whole-grain and low-sodium cereals. Pita bread. Low-fat, low-sodium crackers. Whole-wheat flour tortillas. Meats and other proteins Skinless chicken or [name]. Ground chicken or [name]. Pork with fat trimmed off. Fish and seafood. Egg whites. Dried beans, peas, or lentils. Unsalted nuts, nut butters, and seeds. Unsalted canned beans. Lean cuts of beef with fat trimmed off. Low-sodium, lean precooked or cured meat, such as sausages or meat loaves. Dairy Low-fat (1%) or fat-free (skim) milk. Reduced-fat, low-fat, or fat-free cheeses. Nonfat, low-sodium ricotta or cottage cheese. Low-fat or nonfat yogurt. Low-fat, low-sodium cheese. Fats and oils Soft margarine without trans fats. Vegetable oil. Reduced-fat, low-fat, or light mayonnaise and salad dressings (reduced-sodium). Canola, safflower, olive, [name], soybean, and sunflower oils. [name]. Seasonings and condiments Herbs. Spices. Seasoning mixes without salt. Other foods Unsalted popcorn and pretzels. Fat-free sweets. The items listed above may not be all the foods and drinks you can have. Talk to a dietitian to learn more. What foods should I avoid? Fruits Canned fruit in a light or heavy syrup. Fried fruit. Fruit in cream or butter sauce. Vegetables Creamed or fried vegetables. Vegetables in a cheese sauce. Regular canned vegetables that are not marked as low-sodium or reduced-sodium. Regular canned tomato sauce and paste that are not marked as low-sodium or reduced-sodium. Regular tomato and vegetable juices that are not marked as low-sodium or reduced-sodium. [name]. Olives. Grains Baked goods made with fat, such as croissants, muffins, or some breads. Dry pasta or rice meal packs. Meats and other proteins Fatty cuts of meat. Ribs. Fried meat. [name]. [name], salami, and other precooked or cured meats, such as sausages or meat loaves, that are not lean and low in sodium. Fat from the back of a pig (fatback). Bratwurst.  Salted nuts and seeds. Canned beans with added salt. Canned or smoked fish. Whole eggs or egg yolks. Chicken or [name] with skin. Dairy Whole or 2% milk, cream, and half-and-half. Whole or full-fat cream cheese. Whole-fat or sweetened yogurt. Full-fat cheese. Nondairy creamers. Whipped toppings. Processed cheese and cheese spreads. Fats and oils Butter. Stick margarine. Lard. Shortening. Ghee. Bacon fat. Tropical oils, such as coconut, palm kernel, or palm oil. Seasonings and condiments Onion salt, garlic salt, seasoned salt, table salt, and sea salt. Worcestershire sauce. Tartar sauce. Barbecue sauce. Teriyaki sauce. Soy sauce, including reduced-sodium soy sauce. Steak sauce. Canned and packaged gravies. Fish sauce. Oyster sauce. Cocktail sauce. Store-bought horseradish. Ketchup. Mustard. Meat flavorings and tenderizers. Bouillon cubes. Hot sauces. Pre-made or packaged marinades. Pre-made or packaged taco seasonings. Relishes. Regular salad dressings. Other foods Salted popcorn and pretzels. The items listed above may not be all the foods and drinks you should avoid. Talk to a dietitian to learn more. Where to find more information National Heart, Lung, and Blood Institute (NHLBI): BuffaloDryCleaner.gl American Heart Association (AHA): heart.org Academy of Nutrition and Dietetics: eatright.org National Kidney Foundation (NKF): kidney.org This information is not intended to replace advice given to you by your health care provider. Make sure  you discuss any questions you have with your health care provider. Document Revised: 05/31/2022 Document Reviewed: 05/31/2022 Elsevier Patient Education  2024 [employer].

## 2023-09-06 ENCOUNTER — Encounter: Payer: Self-pay | Admitting: *Deleted

## 2023-09-06 ENCOUNTER — Other Ambulatory Visit: Payer: Self-pay

## 2023-09-06 ENCOUNTER — Other Ambulatory Visit: Payer: Self-pay | Admitting: *Deleted

## 2023-09-06 NOTE — Patient Instructions (Signed)
 Visit Information  Thank you for taking time to visit with me today. Please don't hesitate to contact me if I can be of assistance to you before our next scheduled appointment.  Your next care management appointment is by telephone on 10-07-2023 at 9;00 am  Telephone follow-up in 1 month  Please call the care guide team at [phone] if you need to cancel, schedule, or reschedule an appointment.   Please call the Suicide and Crisis Lifeline: 988 call the [place] National Suicide Prevention Lifeline: 973-521-6995 or TTY: (986) 314-3404 TTY 778-298-7774) to talk to a trained counselor call 1-800-273-TALK (toll free, 24 hour hotline) call the [facility]: 2406818896 if you are experiencing a Mental Health or Behavioral Health Crisis or need someone to talk to.  Danise Edge, BSN RN [facility], [facility] RN Care Manager Direct Dial: [phone]  Fax: [phone]

## 2023-09-06 NOTE — Patient Outreach (Signed)
 Complex Care Management   Visit Note  09/06/2023  Name:  [name] MRN: [id number] DOB: 06-02-1940  Situation: Referral received for Complex Care Management related to  Hypertension  I obtained verbal consent from patient.  Visit completed with patient  on the phone  Background:   Past Medical History:  Diagnosis Date   Acute idiopathic pericarditis 07/15/2023   Agatston coronary artery calcium score between 200 and 399 07/15/2023   Arthritis    OA   Atypical chest pain 03/19/2022   CHF (congestive heart failure) (HCC)    GERD (gastroesophageal reflux disease)    Headache(784.0)    Hepatitis    history of Hepatitis 20 years ago; not sure what kind   History of gout    Hypertension    Hypertensive encephalopathy    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   Stroke (HCC)    SLIGHT RT SIDE WEAKNESS 2001   Unintentional weight loss 03/19/2022    Assessment: Patient Reported Symptoms:  Cognitive Alert and oriented to person, place, and time  Neurological No symptoms reported    HEENT No symptoms reported    Cardiovascular Chest pain or discomfort (Reports 3/10 chest pain this morning, nitroglycerin effective. 0/10 during outreach.)    Respiratory No symptoms reported    Endocrine No symptoms reported    Gastrointestinal No symptoms reported    Genitourinary No symptoms reported    Integumentary No symptoms reported    Musculoskeletal Unsteady gait    Psychosocial No symptoms reported     Vitals:   09/06/23 0919  BP: (!) 156/75    Medications Reviewed Today     Reviewed by Ricky Stabs, RN (Registered Nurse) on 09/06/23 at [phone]  Med List Status: <None>   Medication Order Taking? Sig Documenting Provider Last Dose Status Informant  acetaminophen (TYLENOL) 500 MG tablet [id number] Yes Take 2 tablets (1,000 mg total) by mouth 3 (three) times daily. Mechele Claude, MD Taking Active Self, Pharmacy Records  albuterol (VENTOLIN HFA) 108 (90  Base) MCG/ACT inhaler [id number] Yes INHALE 2 PUFFS INTO THE LUNGS EVERY 6 HOURS AS NEEDED FOR WHEEZE OR SHORTNESS OF Dedra Skeens, MD Taking Active   alendronate (FOSAMAX) 70 MG tablet [id number] Yes Take 1 tablet (70 mg total) by mouth every 7 (seven) days. Take with a full glass of water on an empty stomach. Mechele Claude, MD Taking Active Self, Pharmacy Records  amLODipine-olmesartan [facility]) 10-40 MG tablet [id number] Yes Take 1 tablet by mouth daily. For blood pressure. Mechele Claude, MD Taking Active   arformoterol [facility]) 15 MCG/2ML NEBU [id number] Yes Take 2 mLs (15 mcg total) by nebulization 2 (two) times daily. Hunsucker, Lesia Sago, MD Taking Active Self, Pharmacy Records  atorvastatin (LIPITOR) 80 MG tablet [id number] Yes Take 1 tablet (80 mg total) by mouth daily.  Patient taking differently: Take 80 mg by mouth at bedtime.   Alver Sorrow, NP Taking Active Self, Pharmacy Records  budesonide (PULMICORT) 0.5 MG/2ML nebulizer solution [id number] Yes Take 2 mLs (0.5 mg total) by nebulization 2 (two) times daily. Hunsucker, Lesia Sago, MD Taking Active Self, Pharmacy Records  budesonide-formoterol [facility]) 160-4.5 MCG/ACT inhaler [id number] Yes INHALE 2 PUFFS INTO THE LUNGS TWICE A Lowella Fairy, MD Taking Active   cloNIDine (CATAPRES - DOSED IN MG/24 HR) 0.3 mg/24hr patch [id number] Yes Place 1 patch (0.3 mg total) onto the skin once a week. Mechele Claude, MD Taking Active   clopidogrel (PLAVIX) 75 MG tablet [id number] Yes Take 1 tablet (75  mg total) by mouth daily. Alver Sorrow, NP Taking Active Self, Pharmacy Records  colchicine 0.6 MG tablet [id number] Yes Take 1 tablet (0.6 mg total) by mouth daily. Tessa Lerner, DO Taking Active   furosemide (LASIX) 20 MG tablet [id number] Yes Take 1 tablet (20 mg total) by mouth daily. For swelling Alver Sorrow, NP Taking Active Self, Pharmacy Records  guaiFENesin (MUCINEX) 600 MG 12 hr tablet [id number] Yes Take 600 mg by mouth 2  (two) times daily. [provider] Taking Active Self, Pharmacy Records  hydrALAZINE (APRESOLINE) 100 MG tablet [id number] Yes Take 1 tablet (100 mg total) by mouth 3 (three) times daily. Alver Sorrow, NP Taking Active   ipratropium-albuterol (DUONEB) 0.5-2.5 (3) MG/3ML SOLN [id number] Yes Take 3 mLs by nebulization in the morning and at bedtime. May increase to 4 times a day as needed for increasing shortness of breath. Mechele Claude, MD Taking Active   isosorbide mononitrate (IMDUR) 60 MG 24 hr tablet [id number] Yes Take 1 tablet (60 mg total) by mouth daily. For blood pressure and to prevent chest pain. Alver Sorrow, NP Taking Active Self, Pharmacy Records  Magnesium 250 MG TABS [id number] Yes Take 1 tablet (250 mg total) by mouth daily. Mechele Claude, MD Taking Active Self, Pharmacy Records  pantoprazole (PROTONIX) 40 MG tablet [id number]  Take 1 tablet (40 mg total) by mouth daily. Tolia, Sunit, DO  Expired 08/14/23 2359   potassium chloride SA (KLOR-CON M) 20 MEQ tablet [id number] Yes Take 1 tablet (20 mEq total) by mouth daily. For potassium replacement/ supplement Mechele Claude, MD Taking Active   pregabalin (LYRICA) 50 MG capsule [id number]  1 qhs X7 days , then 2 qhs X 7d, then 3 qhs X 7d, then 4 qhs Mechele Claude, MD  Active   QUEtiapine (SEROQUEL) 25 MG tablet [id number] Yes Take 1 tablet (25 mg total) by mouth at bedtime. Mechele Claude, MD Taking Active   rOPINIRole (REQUIP) 1 MG tablet [id number] Yes Take 1 tablet (1 mg total) by mouth at bedtime. For leg cramps Mechele Claude, MD Taking Active Self, Pharmacy Records  SUMAtriptan [facility]) 100 MG tablet [id number] Yes Take one at onset of HA. May repeat in 2 hours if headache persists or recurs. Limit two per 24 hours Mechele Claude, MD Taking Active Self, Pharmacy Records           Med Note [facility] [place], [place] A   Wed Jul 10, 2023  6:22 PM) Prn   vitamin B-12 1000 MCG tablet [id number] Yes Take 1 tablet (1,000 mcg  total) by mouth daily. Cleora Fleet, MD Taking Active Self, Pharmacy Records  Vitamin D, Ergocalciferol, (DRISDOL) 1.25 MG (50000 UNIT) CAPS capsule [id number] Yes Take 1 capsule (50,000 Units total) by mouth every 7 (seven) days. Mechele Claude, MD Taking Active Self, Pharmacy Records           Med Note [facility] Ronnald Nian   Wed Jul 10, 2023  6:14 PM)              Recommendation:   DME requests:  shower chair  Follow Up Plan:   Telephone follow up appointment date/time:  10-07-2023 at 9:00 am  Danise Edge, BSN RN [facility], [facility] RN Care Manager Direct Dial: [phone]  Fax: [phone]

## 2023-09-10 ENCOUNTER — Other Ambulatory Visit

## 2023-09-10 ENCOUNTER — Other Ambulatory Visit (HOSPITAL_BASED_OUTPATIENT_CLINIC_OR_DEPARTMENT_OTHER): Payer: Self-pay | Admitting: Family

## 2023-09-10 ENCOUNTER — Other Ambulatory Visit: Payer: Self-pay | Admitting: Family Medicine

## 2023-09-10 DIAGNOSIS — J441 Chronic obstructive pulmonary disease with (acute) exacerbation: Secondary | ICD-10-CM

## 2023-09-11 ENCOUNTER — Telehealth: Payer: Self-pay

## 2023-09-11 NOTE — Telephone Encounter (Signed)
 Hello,  Patient will be scheduled as soon as possible.  Auth Submission: APPROVED Site of care: Site of care: [facility] Payer: [employer] Medication & CPT/J Code(s) submitted: Leqvio (Inclisiran) [phone] Route of submission (phone, fax, portal): portal Phone # Fax # Auth type: Buy/Bill PB Units/visits requested: 284mg , 3 visits Reference number: #[id number]  Approval from: 09/11/23 to 09/10/24   Thanks

## 2023-09-13 ENCOUNTER — Other Ambulatory Visit: Payer: Self-pay | Admitting: Family Medicine

## 2023-09-25 ENCOUNTER — Other Ambulatory Visit: Payer: Self-pay | Admitting: Family Medicine

## 2023-09-25 DIAGNOSIS — J441 Chronic obstructive pulmonary disease with (acute) exacerbation: Secondary | ICD-10-CM

## 2023-09-26 ENCOUNTER — Ambulatory Visit (HOSPITAL_BASED_OUTPATIENT_CLINIC_OR_DEPARTMENT_OTHER): Admitting: Cardiovascular Disease

## 2023-09-26 ENCOUNTER — Encounter (HOSPITAL_BASED_OUTPATIENT_CLINIC_OR_DEPARTMENT_OTHER): Payer: Self-pay | Admitting: Cardiovascular Disease

## 2023-09-26 VITALS — BP 240/109 | HR 86 | Ht 61.75 in | Wt 123.4 lb

## 2023-09-26 DIAGNOSIS — I1A0 Resistant hypertension: Secondary | ICD-10-CM

## 2023-09-26 DIAGNOSIS — M7989 Other specified soft tissue disorders: Secondary | ICD-10-CM | POA: Diagnosis not present

## 2023-09-26 MED ORDER — CLONIDINE HCL 0.2 MG PO TABS: 0.2000 mg | ORAL_TABLET | Freq: Two times a day (BID) | ORAL | 3 refills | Status: DC

## 2023-09-26 MED ORDER — CLONIDINE HCL 0.2 MG PO TABS: 0.2000 mg | ORAL_TABLET | Freq: Three times a day (TID) | ORAL | 3 refills | Status: DC

## 2023-09-26 MED ORDER — CLONIDINE HCL 0.2 MG PO TABS
0.2000 mg | ORAL_TABLET | Freq: Once | ORAL | Status: AC
Start: 2023-09-26 — End: 2023-09-26
  Administered 2023-09-26: 0.2 mg via ORAL

## 2023-09-26 NOTE — Patient Instructions (Addendum)
 Medication Instructions:  CLONIDINE  0.2 MG GIVEN NOW   STOP CLONIDINE  PATCH   START CLONIDINE  0.2 MG THREE TIMES A DAY SAME TIME YOU TAKE HYDRALAZINE    Labwork: NONE  Testing/Procedures: Your physician has requested that you have a lower or upper extremity venous duplex. This test is an ultrasound of the veins in the legs or arms. It looks at venous blood flow that carries blood from the heart to the legs or arms. Allow one hour for a Lower Venous exam. Allow thirty minutes for an Upper Venous exam. There are no restrictions or special instructions.  Please note: We ask at that you not bring children with you during ultrasound (echo/ vascular) testing. Due to room size and safety concerns, children are not allowed in the ultrasound rooms during exams. Our front office staff cannot provide observation of children in our lobby area while testing is being conducted. An adult accompanying a patient to their appointment will only be allowed in the ultrasound room at the discretion of the ultrasound technician under special circumstances. We apologize for any inconvenience. RIGHT LOWER EXTREMITY   Follow-Up: 1 MONTH WITH CAITLIN W NP OR DR [facility]   If you need a refill on your cardiac medications before your next appointment, please call your pharmacy.

## 2023-09-26 NOTE — Progress Notes (Unsigned)
 Cardiology Office Note:    Date:  09/30/2023   ID:  [name], DOB 06-09-1940, MRN [id number]  PCP:  Roise Cleaver, MD   [facility] Providers Cardiologist:  Maudine Sos, MD     Referring MD: Roise Cleaver, MD   No chief complaint on file.   History of Present Illness:    [name] is a 83 y.o. female with a PMHx of CAD, pericarditis, HTN, HLD, COPD, penetrating aortic ulcer, stroke, and CKD here for follow up.  She was first seen for chest pain 02/2022.  She was seen in the ED on 03/12/22 with chest pain and shortness of breath that awoke her from sleep. High sensitivity troponin was negative x2 and EKG was unremarkable at that time. She was referred for outpatient cardiology follow up.  Her symptoms were atypical and food related.  She was referred for coronary CT-A that revealed moderate  non-obstructive disease.  Calcium  score 396 which was 85th percentile.  She was admitted 08/2022 with hypertensive urgency requiring a nicardipine  drip.  She was found to have focal renal artery stenosis on the L.  Dr. Fulton Job reccommended continued follow up.  Valsartan  and hydrochlorothiazide  were due to renal dysfunction.    At her visit 09/2022 Zetia  was added.  She was seen in the ED 04/2019 for blood pressures which subsequently.  At follow-up 04/2023 was noted that she was taking any of her cholesterol medicine (Zetia , Repatha , atorvastatin ).  She was seen at [facility] 06/2023 for flu-like symptoms.  Troponin was elevated 56-->366-->551.  BNP 6237.  She underwent LHC and had non-obstructive CAD.  ESR and CRP were elevated so she was treated with colchicine  and NSAIDS.  She saw Neomi Banks, NP 07/2023 and hydrlazine 50mg  tid was added.  PCP added Seroquel  for anxiety.  She saw [facility] 07/2023 and BP averaged 149/82.  Hydralazine  was increased to 100mg  tid.   Discussed the use of AI scribe software for clinical note transcription with the patient, who gave verbal consent to  proceed.  History of Present Illness [name] is an 83 year old female with hypertension who presents with skin irritation from clonidine  patches and right foot swelling.  She experiences skin irritation and itching from clonidine  patches used for blood pressure management. The area under the patch becomes itchy and wet after removal, which she suspects is an allergic reaction, particularly to the white piece covering the medicated patch. She removed the patch this morning at 12 o'clock before taking a shower.  She reports swelling in her right foot, which began three days ago. The swelling was significant enough to cause difficulty wearing shoes, necessitating the use of an old pair. Initially, the swelling was accompanied by pain similar to sciatic nerve pain, radiating up to her buttock, but the pain has since subsided. She can now tolerate covers on her foot, unlike previous gout episodes. No recent injury to the foot is noted, and her dog can lay on it without causing discomfort.  Her blood pressure has been fluctuating, which she attributes to the removal of the clonidine  patch. She is currently taking amlodipine  and olmesartan  combination, hydralazine  three times a day, and isosorbide  mononitrate. She also takes Lasix  but feels it is not helping much. She feels overwhelmed by the number of medications, stating it affects her appetite, leading her to eat only one meal a day.  She maintains a routine of walking three miles a day for exercise, but the recent foot swelling  has disrupted this schedule. Her cholesterol levels were noted to be high in February, and she is awaiting initiation of a twice-yearly cholesterol medication, Leqvio, which has been approved by her insurance.  Medication regimen Changes Clondine patch on Fridays AM: Amlodipine -Olmesartan , Lasix , Hydralazine  Afternoon: Hydralazine  PM: Isosorbide , Hydralazine    Prior antihypertensives Metoprolol  -  fatigue Bisoprolol  Hydrochlorothiazide  Lisinopril  - changed to ARB Clonidine  tablet - transitioned to patch Valsartan  Losartan   Past Medical History:  Diagnosis Date   Acute idiopathic pericarditis 07/15/2023   Agatston coronary artery calcium  score between 200 and 399 07/15/2023   Arthritis    OA   Atypical chest pain 03/19/2022   CHF (congestive heart failure) (HCC)    GERD (gastroesophageal reflux disease)    Headache(784.0)    Hepatitis    history of Hepatitis 20 years ago; not sure what kind   History of gout    Hypertension    Hypertensive encephalopathy    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   Stroke (HCC)    SLIGHT RT SIDE WEAKNESS 2001   Unintentional weight loss 03/19/2022    Past Surgical History:  Procedure Laterality Date   ABDOMINAL HYSTERECTOMY     ANTERIOR CERVICAL CORPECTOMY  03/07/2012   Procedure: ANTERIOR CERVICAL CORPECTOMY;  Surgeon: Elna Haggis, MD;  Location: [facility] NEURO ORS;  Service: Neurosurgery;  Laterality: N/A;  Cervical six-seven, cervical seven-thoracic one Anterior cervical decompression/diskectomy/fusion, with Cervical seven Corpectomy, reconstruction using Allograft and Alphatec plate   CATARACT EXTRACTION W/PHACO Left 04/12/2017   Procedure: CATARACT EXTRACTION PHACO AND INTRAOCULAR LENS PLACEMENT (IOC);  Surgeon: Tarri Farm, MD;  Location: [facility] ORS;  Service: Ophthalmology;  Laterality: Left;  CDE: 3.82   CATARACT EXTRACTION W/PHACO Right 05/03/2017   Procedure: CATARACT EXTRACTION PHACO AND INTRAOCULAR LENS PLACEMENT RIGHT EYE;  Surgeon: Tarri Farm, MD;  Location: [facility] ORS;  Service: Ophthalmology;  Laterality: Right;  CDE: 3.89   LEFT HEART CATH AND CORONARY ANGIOGRAPHY N/A 07/11/2023   Procedure: LEFT HEART CATH AND CORONARY ANGIOGRAPHY;  Surgeon: Cody Das, MD;  Location: [facility] INVASIVE CV LAB;  Service: Cardiovascular;  Laterality: N/A;   MULTIPLE TOOTH EXTRACTIONS     TONSILLECTOMY      Current  Medications: Current Meds  Medication Sig   acetaminophen  (TYLENOL ) 500 MG tablet Take 2 tablets (1,000 mg total) by mouth 3 (three) times daily.   albuterol  (VENTOLIN  HFA) 108 (90 Base) MCG/ACT inhaler INHALE 2 PUFFS INTO THE LUNGS EVERY 6 HOURS AS NEEDED FOR WHEEZE OR SHORTNESS OF BREATH   alendronate  (FOSAMAX ) 70 MG tablet Take 1 tablet (70 mg total) by mouth every 7 (seven) days. Take with a full glass of water  on an empty stomach.   amLODipine -olmesartan  (AZOR ) 10-40 MG tablet Take 1 tablet by mouth daily. For blood pressure.   arformoterol  (BROVANA ) 15 MCG/2ML NEBU Take 2 mLs (15 mcg total) by nebulization 2 (two) times daily.   atorvastatin  (LIPITOR ) 80 MG tablet Take 1 tablet (80 mg total) by mouth daily. (Patient taking differently: Take 80 mg by mouth at bedtime.)   budesonide  (PULMICORT ) 0.5 MG/2ML nebulizer solution Take 2 mLs (0.5 mg total) by nebulization 2 (two) times daily.   budesonide -formoterol  (SYMBICORT ) 160-4.5 MCG/ACT inhaler INHALE 2 PUFFS INTO THE LUNGS TWICE A DAY   clopidogrel  (PLAVIX ) 75 MG tablet Take 1 tablet (75 mg total) by mouth daily.   colchicine  0.6 MG tablet Take 1 tablet (0.6 mg total) by mouth daily.   furosemide  (LASIX ) 20 MG tablet Take 1 tablet (20 mg total) by  mouth daily. For swelling   guaiFENesin  (MUCINEX ) 600 MG 12 hr tablet Take 600 mg by mouth 2 (two) times daily.   hydrALAZINE  (APRESOLINE ) 100 MG tablet Take 1 tablet (100 mg total) by mouth 3 (three) times daily.   ipratropium-albuterol  (DUONEB) 0.5-2.5 (3) MG/3ML SOLN Take 3 mLs by nebulization in the morning and at bedtime. May increase to 4 times a day as needed for increasing shortness of breath.   Magnesium  250 MG TABS Take 1 tablet (250 mg total) by mouth daily.   potassium chloride  SA (KLOR-CON  M) 20 MEQ tablet Take 1 tablet (20 mEq total) by mouth daily. For potassium replacement/ supplement   pregabalin  (LYRICA ) 50 MG capsule 1 qhs X7 days , then 2 qhs X 7d, then 3 qhs X 7d, then 4 qhs    QUEtiapine  (SEROQUEL ) 25 MG tablet Take 1 tablet (25 mg total) by mouth at bedtime.   rOPINIRole  (REQUIP ) 1 MG tablet TAKE 1 TABLET (1 MG TOTAL) BY MOUTH AT BEDTIME. FOR LEG CRAMPS   SUMAtriptan  (IMITREX ) 100 MG tablet Take one at onset of HA. May repeat in 2 hours if headache persists or recurs. Limit two per 24 hours   vitamin B-12 1000 MCG tablet Take 1 tablet (1,000 mcg total) by mouth daily.   Vitamin D , Ergocalciferol , (DRISDOL ) 1.25 MG (50000 UNIT) CAPS capsule Take 1 capsule (50,000 Units total) by mouth every 7 (seven) days.   [DISCONTINUED] cloNIDine  (CATAPRES  - DOSED IN MG/24 HR) 0.3 mg/24hr patch Place 1 patch (0.3 mg total) onto the skin once a week.   [DISCONTINUED] cloNIDine  (CATAPRES ) 0.2 MG tablet Take 1 tablet (0.2 mg total) by mouth 2 (two) times daily.     Allergies:   Penicillins   Social History   Socioeconomic History   Marital status: Widowed    Spouse name: Not on file   Number of children: 5   Years of education: 19   Highest education level: 12th grade  Occupational History   Occupation: retired    Comment: CNA  Tobacco Use   Smoking status: Former    Current packs/day: 0.00    Average packs/day: 0.5 packs/day for 61.8 years (30.9 ttl pk-yrs)    Types: Cigarettes    Start date: 64    Quit date: 03/24/2022    Years since quitting: 1.5    Passive exposure: Past   Smokeless tobacco: Never  Vaping Use   Vaping status: Never Used  Substance and Sexual Activity   Alcohol use: Not Currently   Drug use: No   Sexual activity: Not Currently    Partners: Male    Birth control/protection: Surgical  Other Topics Concern   Not on file  Social History Narrative   5 children living, 1 deceased.    Children live out of state.   Social Drivers of Corporate investment banker Strain: Low Risk  (07/15/2023)   Overall Financial Resource Strain (CARDIA)    Difficulty of Paying Living Expenses: Not hard at all  Food Insecurity: No Food Insecurity (09/06/2023)    Hunger Vital Sign    Worried About Running Out of Food in the Last Year: Never true    Ran Out of Food in the Last Year: Never true  Transportation Needs: No Transportation Needs (09/06/2023)   PRAPARE - Administrator, Civil Service (Medical): No    Lack of Transportation (Non-Medical): No  Physical Activity: Inactive (07/15/2023)   Exercise Vital Sign    Days of Exercise per  Week: 0 days    Minutes of Exercise per Session: 0 min  Stress: No Stress Concern Present (07/15/2023)   [employer] of Occupational Health - Occupational Stress Questionnaire    Feeling of Stress : Only a little  Social Connections: Moderately Integrated (07/15/2023)   Social Connection and Isolation Panel [NHANES]    Frequency of Communication with Friends and Family: More than three times a week    Frequency of Social Gatherings with Friends and Family: More than three times a week    Attends Religious Services: More than 4 times per year    Active Member of [employer] or Organizations: Yes    Attends Engineer, structural: More than 4 times per year    Marital Status: Separated     Family History: The patient's family history includes Asthma in her daughter; Bipolar disorder in her daughter, daughter, daughter, daughter, and son; Drug abuse in her daughter; Heart disease in her daughter and daughter; Hypertension in her daughter. She was adopted.  ROS:   Please see the history of present illness.    (+) Shortness of breath (+) Orthopnea (+) Chest pain (+) Weight loss All other systems reviewed and are negative.  EKGs/Labs/Other Studies Reviewed:    The following studies were reviewed today:  EKG:  03/19/2022: EKG is not ordered today.  Recent Labs: 07/12/2023: Magnesium  2.1 07/29/2023: ALT 9; BNP 142.8; Hemoglobin 14.1; Platelets 206 08/06/2023: BUN 16; Creatinine, Ser 1.02; Potassium 3.7; Sodium 142; TSH 3.850  Recent Lipid Panel    Component Value Date/Time   CHOL 174  07/11/2023 0618   CHOL 216 (H) 06/11/2023 0920   TRIG 45 07/11/2023 0618   HDL 52 07/11/2023 0618   HDL 86 06/11/2023 0920   CHOLHDL 3.3 07/11/2023 0618   VLDL 9 07/11/2023 0618   LDLCALC 113 (H) 07/11/2023 0618   LDLCALC 118 (H) 06/11/2023 0920     Risk Assessment/Calculations:      Physical Exam:    VS:  BP (!) 240/109   Pulse 86   Ht 5' 1.75" (1.568 m)   Wt 123 lb 6.4 oz (56 kg)   SpO2 96%   BMI 22.75 kg/m  , BMI Body mass index is 22.75 kg/m. GENERAL:  Well appearing HEENT: Pupils equal round and reactive, fundi not visualized, oral mucosa unremarkable NECK:  No jugular venous distention, waveform within normal limits, carotid upstroke brisk and symmetric, no bruits, no thyromegaly LUNGS:  Clear to auscultation bilaterally HEART:  RRR.  PMI not displaced or sustained,S1 and S2 within normal limits, no S3, no S4, no clicks, no rubs, no murmurs ABD:  Flat, positive bowel sounds normal in frequency in pitch, no bruits, no rebound, no guarding, no midline pulsatile mass, no hepatomegaly, no splenomegaly EXT:  2 plus pulses throughout, + R LE edema, no cyanosis no clubbing SKIN:  No rashes no nodules NEURO:  Cranial nerves II through XII grossly intact, motor grossly intact throughout PSYCH:  Cognitively intact, oriented to person place and time  ASSESSMENT/PLAN:    Assessment & Plan # Hypertension Hypertensive urgency.  She is asymptomatic and declines going to the ED. Likely spiked after she self-discontinued the clonidine .  Hypertension poorly controlled with current regimen. Allergic reaction to clonidine  patch necessitates change to oral clonidine .  Continue amlodipine /olmesartan , hydralazine , and Imdur . - Discontinue clonidine  patch. - Initiate oral clonidine  0.2mg  three times daily with hydralazine . - Administer clonidine  in-office to reduce blood pressure before discharge.  # Allergic reaction to clonidine  patch Allergic  reaction with itching and rash at  application site. - Discontinue clonidine  patch. - Switch to oral clonidine .  # Hyperlipidemia Persistent hyperlipidemia with approved Leqvio treatment pending. - Coordinate with nursing staff to schedule Leqvio administration.  # Foot swelling Unilateral right foot swelling with differential including DVT, gout, or spider bite. - Order ultrasound to rule out deep vein thrombosis.  # Dispo: :F/u in1 month   Medication Adjustments/Labs and Tests Ordered: Current medicines are reviewed at length with the patient today.  Concerns regarding medicines are outlined above.  Orders Placed This Encounter  Procedures   VAS US  LOWER EXTREMITY VENOUS (DVT)   Meds ordered this encounter  Medications   DISCONTD: cloNIDine  (CATAPRES ) 0.2 MG tablet    Sig: Take 1 tablet (0.2 mg total) by mouth 2 (two) times daily.    Dispense:  270 tablet    Refill:  3    D/C CLONIDINE  PATCH   cloNIDine  (CATAPRES ) 0.2 MG tablet    Sig: Take 1 tablet (0.2 mg total) by mouth 3 (three) times daily.    Dispense:  270 tablet    Refill:  3    D/C CLONIDINE  PATCH AND TWICE A DAY RX   cloNIDine  (CATAPRES ) tablet 0.2 mg    Patient Instructions  Medication Instructions:  CLONIDINE  0.2 MG GIVEN NOW   STOP CLONIDINE  PATCH   START CLONIDINE  0.2 MG THREE TIMES A DAY SAME TIME YOU TAKE HYDRALAZINE    Labwork: NONE  Testing/Procedures: Your physician has requested that you have a lower or upper extremity venous duplex. This test is an ultrasound of the veins in the legs or arms. It looks at venous blood flow that carries blood from the heart to the legs or arms. Allow one hour for a Lower Venous exam. Allow thirty minutes for an Upper Venous exam. There are no restrictions or special instructions.  Please note: We ask at that you not bring children with you during ultrasound (echo/ vascular) testing. Due to room size and safety concerns, children are not allowed in the ultrasound rooms during exams. Our front office  staff cannot provide observation of children in our lobby area while testing is being conducted. An adult accompanying a patient to their appointment will only be allowed in the ultrasound room at the discretion of the ultrasound technician under special circumstances. We apologize for any inconvenience. RIGHT LOWER EXTREMITY   Follow-Up: 1 MONTH WITH CAITLIN W NP OR DR [facility]   If you need a refill on your cardiac medications before your next appointment, please call your pharmacy.     Signed, Maudine Sos, MD  09/30/2023 12:30 PM    [facility]

## 2023-09-27 ENCOUNTER — Telehealth (HOSPITAL_COMMUNITY): Payer: Self-pay

## 2023-09-27 NOTE — Telephone Encounter (Signed)
 Attempted to contact the patient to schedule VAS US .  Yes answer.  Answered, not scheduled.  First Attempt. Was unable to provide direct contact number for scheduling: [phone].   Patient hesistant to schedule due to transportation and other appointments today, attempted to contact requesting office for additional guidance on scheduling timeframes per patient request.

## 2023-09-30 ENCOUNTER — Encounter (HOSPITAL_BASED_OUTPATIENT_CLINIC_OR_DEPARTMENT_OTHER): Payer: Self-pay | Admitting: Cardiovascular Disease

## 2023-09-30 ENCOUNTER — Telehealth: Payer: Self-pay | Admitting: Licensed Clinical Social Worker

## 2023-09-30 NOTE — Telephone Encounter (Signed)
 H&V Care Navigation CSW Progress Note  Clinical Social Worker contacted patient by phone to f/u on stat transportation needs. Pt uses Medicare transportation to get around to appointments and it requires at least 3 days prior auth. Pt reached at [phone]. Introduced self, role, reason for call. Pt aware we are able to assist in this instance due to need for more timely scan with vascular team. Pt ride arranged with [facility] transportation. She is aware and has their number in case of any issues. Will f/u tomorrow to complete transportation waiver verbally. Sent in exemption form to [employer]. No additional questions at this time.  Patient is participating in a Managed Medicaid Plan:  No, [employer] and Medicaid  SDOH Screenings   Food Insecurity: No Food Insecurity (09/06/2023)  Housing: Low Risk  (09/06/2023)  Transportation Needs: No Transportation Needs (09/30/2023)  Utilities: Not At Risk (09/06/2023)  Alcohol Screen: Low Risk  (07/15/2023)  Depression (PHQ2-9): Low Risk  (09/06/2023)  Recent Concern: Depression (PHQ2-9) - Medium Risk (09/05/2023)  Financial Resource Strain: Low Risk  (07/15/2023)  Physical Activity: Inactive (07/15/2023)  Social Connections: Moderately Integrated (07/15/2023)  Stress: No Stress Concern Present (07/15/2023)  Tobacco Use: Medium Risk (09/30/2023)  Health Literacy: Adequate Health Literacy (07/15/2023)    Nathen Balder, MSW, LCSW Clinical Social Worker II [facility] Heart/Vascular Care Navigation  [phone]- work cell phone (preferred)

## 2023-09-30 NOTE — Telephone Encounter (Signed)
 Additional phone note opened in error.   Nathen Balder, MSW, LCSW Clinical Social Worker II [facility] Heart/Vascular Care Navigation  [phone]- work cell phone (preferred)

## 2023-10-02 ENCOUNTER — Ambulatory Visit (HOSPITAL_COMMUNITY)
Admission: RE | Admit: 2023-10-02 | Discharge: 2023-10-02 | Disposition: A | Discharge status: Home / Self Care | Source: Ambulatory Visit | Attending: Cardiovascular Disease | Admitting: Cardiovascular Disease

## 2023-10-02 DIAGNOSIS — M7989 Other specified soft tissue disorders: Secondary | ICD-10-CM | POA: Insufficient documentation

## 2023-10-02 NOTE — Telephone Encounter (Addendum)
 H&V Care Navigation CSW Progress Note  Clinical Social Worker contacted patient by phone to f/u on transportation waiver form and confirm she has spoken with [facility] transportation. No additional questions- completed verbal waiver- pt will need to sign in person waiver during appt which was provided to [facility] in the morning.  Patient is participating in a Managed Medicaid Plan:  No, [employer]/Medicaid  SDOH Screenings   Food Insecurity: No Food Insecurity (09/06/2023)  Housing: Low Risk  (09/06/2023)  Transportation Needs: No Transportation Needs (09/30/2023)  Utilities: Not At Risk (09/06/2023)  Alcohol Screen: Low Risk  (07/15/2023)  Depression (PHQ2-9): Low Risk  (09/06/2023)  Recent Concern: Depression (PHQ2-9) - Medium Risk (09/05/2023)  Financial Resource Strain: Low Risk  (07/15/2023)  Physical Activity: Inactive (07/15/2023)  Social Connections: Moderately Integrated (07/15/2023)  Stress: No Stress Concern Present (07/15/2023)  Tobacco Use: Medium Risk (09/30/2023)  Health Literacy: Adequate Health Literacy (07/15/2023)    Nathen Balder, MSW, LCSW Clinical Social Worker II [facility] Heart/Vascular Care Navigation  [phone]- work cell phone (preferred)       10/02/2023  FLORE RAMIL DOB: 10-Oct-1940 MRN: [id number]   RIDER WAIVER AND RELEASE OF LIABILITY  For the purposes of helping with transportation needs, [facility] partners with outside transportation providers (taxi companies, [place], Catering manager.) to give [place] patients or other approved people the choice of on-demand rides [employer]") to our buildings for non-emergency visits.  By using [employer], I, the person signing this document, on behalf of myself and/or any legal minors (in my care using the [employer]), agree:  Science writer given to me are supplied by independent, outside transportation providers who do not work for, or have any affiliation with, [employer]. [place] is not  a transportation company. [facility] has no control over the quality or safety of the rides I get using [employer]. [place] has no control over whether any outside ride will happen on time or not. [facility] gives no guarantee on the reliability, quality, safety, or availability on any rides, or that no mistakes will happen. I know and accept that traveling by vehicle (car, truck, SVU, [place], bus, taxi, etc.) has risks of serious injuries such as disability, being paralyzed, and death. I know and agree the risk of using [employer] is mine alone, and not [employer]. Transport Services are provided "as is" and as are available. The transportation providers are in charge for all inspections and care of the vehicles used to provide these rides. I agree not to take legal action against [place], its agents, employees, officers, directors, representatives, insurers, attorneys, assigns, successors, subsidiaries, and affiliates at any time for any reasons related directly or indirectly to using [employer]. I also agree not to take legal action against [place] or its affiliates for any injury, death, or damage to property caused by or related to using [employer]. I have read this Waiver and Release of Liability, and I understand the terms used in it and their legal meaning. This Waiver is freely and voluntarily given with the understanding that my right (or any legal minors) to legal action against [place] relating to [employer] is knowingly given up to use these services.   I attest that I read the Ride Waiver and Release of Liability to Charrise V Malone, gave Ms. Neeson the opportunity to ask questions and answered the questions asked (if any). I affirm that [name] then provided consent for assistance with  transportation.     Laine Piggs [facility] [place]

## 2023-10-03 ENCOUNTER — Ambulatory Visit (HOSPITAL_COMMUNITY)

## 2023-10-07 ENCOUNTER — Encounter: Payer: Self-pay | Admitting: *Deleted

## 2023-10-07 ENCOUNTER — Other Ambulatory Visit: Payer: Self-pay | Admitting: *Deleted

## 2023-10-07 NOTE — Patient Outreach (Signed)
 Complex Care Management   Visit Note  10/07/2023  Name:  [name] MRN: [id number] DOB: 1940-12-12  Situation: Referral received for Complex Care Management related to HTN, COPD I obtained verbal consent from Patient.  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   Stroke (HCC)    SLIGHT RT SIDE WEAKNESS 2001   Unintentional weight loss 03/19/2022    Assessment: Patient Reported Symptoms:  Cognitive Cognitive Status: Alert and oriented to person, place, and time, Insightful and able to interpret abstract concepts, Normal speech and language skills Cognitive/Intellectual Conditions Management [RPT]: None reported or documented in medical history or problem list   Health Maintenance Behaviors: Annual physical exam Healing Pattern: Average Health Facilitated by: Rest  Neurological Neurological Review of Symptoms: Dizziness Neurological Management Strategies: Routine screening Neurological Self-Management Outcome: 3 (uncertain)  HEENT HEENT Symptoms Reported: No symptoms reported      Cardiovascular Cardiovascular Symptoms Reported: Dizziness Does patient have uncontrolled Hypertension?: Yes Is patient checking Blood Pressure at home?: Yes Patient's Recent BP reading at home: 178/82 (patient reported) Cardiovascular Conditions: Hypertension, High blood cholesterol Cardiovascular Management Strategies: Medication therapy, Routine screening Weight: 123 lb (55.8 kg)  (patient reported) Cardiovascular Self-Management Outcome: 3 (uncertain)  Respiratory Respiratory Symptoms Reported: No symptoms reported Respiratory Conditions: COPD  Endocrine Patient reports the following symptoms related to hypoglycemia or hyperglycemia : No symptoms reported Is patient diabetic?: No Endocrine Management Strategies: Routine screening  Gastrointestinal Gastrointestinal Symptoms Reported: No symptoms reported   Nutrition Risk Screen (CP): Reduced oral intake over the last month  Genitourinary Genitourinary Symptoms Reported: Incontinence Genitourinary Conditions: Incontinence Genitourinary Management Strategies: Incontinence garment/pad Genitourinary Self-Management Outcome: 3 (uncertain)  Integumentary Integumentary Symptoms Reported: No symptoms reported    Musculoskeletal Musculoskelatal Symptoms Reviewed: Muscle pain Musculoskeletal Conditions: Joint pain, Mobility limited Musculoskeletal Management Strategies: Routine screening, Medication therapy Musculoskeletal Self-Management Outcome: 3 (uncertain) Falls in the past year?: No Number of falls in past year: 1 or less Was there an injury with Fall?: No Fall Risk Category Calculator: 0 Patient Fall Risk Level: Low Fall Risk Patient at Risk for Falls Due to: No Fall Risks Fall risk Follow up: Falls evaluation completed  Psychosocial Psychosocial Symptoms Reported: No symptoms reported Behavioral Health Conditions: Depression Behavioral Management Strategies: Support system Behavioral Health Self-Management Outcome: 3 (uncertain) Major Change/Loss/Stressor/Fears (CP): Medical condition, self Techniques to Cope with Loss/Stress/Change: Diversional activities Quality of Family Relationships: helpful, involved, supportive Do you feel physically threatened by others?: No      10/07/2023    9:24 AM  Depression screen PHQ 2/9  Decreased Interest 0  Down, Depressed, Hopeless 1  PHQ - 2 Score 1    Vitals:    10/07/23 0920  BP: (!) 178/82    Medications Reviewed Today     Reviewed by Remona Carmel, RN (Registered Nurse) on 10/07/23 at [phone]  Med List Status: <None>   Medication Order Taking? Sig Documenting Provider Last Dose  Status Informant  acetaminophen  (TYLENOL ) 500 MG tablet [id number] Yes Take 2 tablets (1,000 mg total) by mouth 3 (three) times daily. Roise Cleaver, MD Taking Active Self, Pharmacy Records  albuterol  (VENTOLIN  HFA) 108 [phone] Base) MCG/ACT inhaler [id number] Yes INHALE 2 PUFFS INTO THE LUNGS EVERY 6 HOURS AS NEEDED FOR WHEEZE OR SHORTNESS OF Karrie Paddy, MD Taking Active   alendronate  (FOSAMAX ) 70 MG tablet [id number] Yes Take 1 tablet (70 mg total) by mouth every 7 (seven) days. Take with a full glass of water  on an empty stomach. Roise Cleaver, MD Taking Active Self, Pharmacy Records  amLODipine -olmesartan  (AZOR ) 10-40 MG tablet [id number] Yes Take 1 tablet by mouth daily. For blood pressure. Roise Cleaver, MD Taking Active   arformoterol  (BROVANA ) 15 MCG/2ML NEBU [id number] Yes Take 2 mLs (15 mcg total) by nebulization 2 (two) times daily. Hunsucker, Archer Kobs, MD Taking Active Self, Pharmacy Records  atorvastatin  (LIPITOR ) 80 MG tablet [id number] Yes Take 1 tablet (80 mg total) by mouth daily.  Patient taking differently: Take 80 mg by mouth at bedtime.   Clearnce Curia, NP Taking Active Self, Pharmacy Records  budesonide  (PULMICORT ) 0.5 MG/2ML nebulizer solution [id number] Yes Take 2 mLs (0.5 mg total) by nebulization 2 (two) times daily. Hunsucker, Archer Kobs, MD Taking Active Self, Pharmacy Records  budesonide -formoterol  (SYMBICORT ) 160-4.5 MCG/ACT inhaler [id number] Yes INHALE 2 PUFFS INTO THE LUNGS TWICE A Sophronia Dutton, MD Taking Active   cloNIDine  (CATAPRES ) 0.2 MG tablet [id number] Yes Take 1 tablet (0.2 mg total) by mouth 3 (three) times daily. Maudine Sos, MD Taking Active   clopidogrel  (PLAVIX ) 75 MG tablet [id number] Yes Take 1 tablet (75 mg  total) by mouth daily. Clearnce Curia, NP Taking Active Self, Pharmacy Records  colchicine  0.6 MG tablet [id number] Yes Take 1 tablet (0.6 mg total) by mouth daily. Tolia, Sunit, DO Taking Active   furosemide  (LASIX ) 20 MG tablet [id number] Yes Take 1 tablet (20 mg total) by mouth daily. For swelling Clearnce Curia, NP Taking Active Self, Pharmacy Records  guaiFENesin  (MUCINEX ) 600 MG 12 hr tablet [id number] Yes Take 600 mg by mouth 2 (two) times daily. [provider] Taking Active Self, Pharmacy Records  hydrALAZINE  (APRESOLINE ) 100 MG tablet [id number] Yes Take 1 tablet (100 mg total) by mouth 3 (three) times daily. Clearnce Curia, NP Taking Active   ipratropium-albuterol  (DUONEB) 0.5-2.5 (3) MG/3ML SOLN [id number] Yes Take 3 mLs by nebulization in the morning and at bedtime. May increase to 4 times a day as needed for increasing shortness of breath. Roise Cleaver, MD Taking Active   isosorbide  mononitrate (IMDUR ) 60 MG 24 hr tablet [id number]  Take 1 tablet (60 mg total) by mouth daily. For blood pressure and to prevent chest pain. Clearnce Curia, NP  Expired 09/09/23 2359 Self, Pharmacy Records  Magnesium  250 MG TABS [id number] Yes Take 1 tablet (250 mg total) by mouth daily. Roise Cleaver, MD Taking Active Self, Pharmacy Records  pantoprazole  (PROTONIX ) 40 MG tablet [id number]  Take 1 tablet (40 mg total) by mouth daily. Tolia, Sunit, DO  Expired 08/14/23 2359   potassium chloride  SA (KLOR-CON  M) 20 MEQ tablet [id number] Yes Take 1 tablet (20 mEq total) by mouth daily. For potassium replacement/ supplement Roise Cleaver, MD Taking Active   pregabalin  (LYRICA ) 50 MG capsule [id number] Yes 1 qhs X7 days , then 2 qhs X 7d, then 3 qhs X 7d, then 4 qhs Roise Cleaver, MD Taking Active   QUEtiapine  (SEROQUEL ) 25 MG tablet  [id number]  Take 1 tablet (25 mg total) by mouth at bedtime. Roise Cleaver, MD  Active   rOPINIRole  (REQUIP ) 1 MG tablet [id number] Yes TAKE 1 TABLET (1 MG TOTAL) BY MOUTH  AT BEDTIME. FOR LEG CRAMPS Roise Cleaver, MD Taking Active   SUMAtriptan  (IMITREX ) 100 MG tablet [id number] Yes Take one at onset of HA. May repeat in 2 hours if headache persists or recurs. Limit two per 24 hours Roise Cleaver, MD Taking Active Self, Pharmacy Records           Med Note [facility] [place], [place] A   Wed Jul 10, 2023  6:22 PM) Prn   vitamin B-12 1000 MCG tablet [id number] Yes Take 1 tablet (1,000 mcg total) by mouth daily. Rayfield Cairo, MD Taking Active Self, Pharmacy Records  Vitamin D , Ergocalciferol , (DRISDOL ) 1.25 MG (50000 UNIT) CAPS capsule [id number] Yes Take 1 capsule (50,000 Units total) by mouth every 7 (seven) days. Roise Cleaver, MD Taking Active Self, Pharmacy Records           Med Note [facility] [place], [place] A   Wed Jul 10, 2023  6:14 PM)              Recommendation:   Referral to: Neurosurgeon/Pain Management for back pain  Follow Up Plan:   Telephone follow up appointment date/time:  11-04-2023 at 9:00 am  Grandville Lax, BSN RN [facility] [place], [facility] Health RN Care Manager Direct Dial: [phone]  Fax: [phone]

## 2023-10-07 NOTE — Patient Instructions (Signed)
 Visit Information  Thank you for taking time to visit with me today. Please don't hesitate to contact me if I can be of assistance to you before our next scheduled appointment.  Your next care management appointment is by telephone on 11-04-2023 at 9:00 am  Telephone follow-up in 1 month  Please call the care guide team at [phone] if you need to cancel, schedule, or reschedule an appointment.   Please call the Suicide and Crisis Lifeline: 988 call the [place]  National Suicide Prevention Lifeline: 337-132-1194 or TTY: 484-646-9819 TTY 229-720-4757) to talk to a trained counselor call 1-800-273-TALK (toll free, 24 hour hotline) call the [facility]: [phone] call 911 if you are experiencing a Mental Health or Behavioral Health Crisis or need someone to talk to.  Grandville Lax, BSN RN [facility], [facility] Health RN Care Manager Direct Dial: [phone]  Fax: [phone]    DASH Eating Plan DASH stands for Dietary Approaches to Stop Hypertension. The DASH eating plan is a healthy eating plan that has been shown to: Lower high blood pressure (hypertension). Reduce your risk for type 2 diabetes, heart disease, and stroke. Help with weight loss. What are tips for following this plan? Reading food labels Check food labels for the amount of salt (sodium) per serving. Choose foods with less than 5 percent of the Daily Value (DV) of sodium. In general, foods with less than 300 milligrams (mg) of sodium per serving fit into this eating plan. To find whole grains, look for the word "whole" as the first word in the ingredient list. Shopping Buy products labeled as "low-sodium" or "no salt added." Buy fresh foods. Avoid canned foods and pre-made or frozen meals. Cooking Try not to add salt when you cook. Use salt-free seasonings or herbs instead of table salt or sea salt. Check with your health care provider or pharmacist before using  salt substitutes. Do not fry foods. Cook foods in healthy ways, such as baking, boiling, grilling, roasting, or broiling. Cook using oils that are good for your heart. These include olive, canola, avocado, soybean, and sunflower oil. Meal planning  Eat a balanced diet. This should include: 4 or more servings of fruits and 4 or more servings of vegetables each day. Try to fill half of your plate with fruits and vegetables. 6-8 servings of whole grains each day. 6 or less servings of lean meat, poultry, or fish each day. 1 oz is 1 serving. A 3 oz (85 g) serving of meat is about the same size as the palm of your hand. One egg is 1 oz (28 g). 2-3 servings of low-fat dairy each day. One serving is 1 cup (237 mL). 1 serving of nuts, seeds, or beans 5 times each week. 2-3 servings of heart-healthy fats. Healthy fats called omega-3 fatty acids are found in foods such as walnuts, flaxseeds, fortified milks, and eggs. These fats are also found in cold-water  fish, such as sardines, salmon, and mackerel. Limit how much you eat of: Canned or prepackaged foods. Food that is high in trans fat, such as fried foods. Food that is high in saturated fat, such as fatty meat. Desserts and other sweets, sugary drinks, and other foods with added sugar. Full-fat dairy products. Do not salt foods before eating. Do not eat more than 4 egg yolks a week. Try to eat at least 2 vegetarian meals a week. Eat more home-cooked food and less restaurant, buffet, and fast food. Lifestyle When eating at a  restaurant, ask if your food can be made with less salt or no salt. If you drink alcohol: Limit how much you have to: 0-1 drink a day if you are female. 0-2 drinks a day if you are female. Know how much alcohol is in your drink. In the [place] one drink is one 12 oz bottle of beer (355 mL), one 5 oz glass of wine (148 mL), or one 1 oz glass of hard liquor (44 mL). General information Avoid eating more than 2,300 mg of salt a  day. If you have hypertension, you may need to reduce your sodium intake to 1,500 mg a day. Work with your provider to stay at a healthy body weight or lose weight. Ask what the best weight range is for you. On most days of the week, get at least 30 minutes of exercise that causes your heart to beat faster. This may include walking, swimming, or biking. Work with your provider or dietitian to adjust your eating plan to meet your specific calorie needs. What foods should I eat? Fruits All fresh, dried, or frozen fruit. Canned fruits that are in their natural juice and do not have sugar added to them. Vegetables Fresh or frozen vegetables that are raw, steamed, roasted, or grilled. Low-sodium or reduced-sodium tomato and vegetable juice. Low-sodium or reduced-sodium tomato sauce and tomato paste. Low-sodium or reduced-sodium canned vegetables. Grains Whole-grain or whole-wheat bread. Whole-grain or whole-wheat pasta. Brown rice. Dwyane Glad. Bulgur. Whole-grain and low-sodium cereals. Pita bread. Low-fat, low-sodium crackers. Whole-wheat flour tortillas. Meats and other proteins Skinless chicken or [name]. Ground chicken or [name]. Pork with fat trimmed off. Fish and seafood. Egg whites. Dried beans, peas, or lentils. Unsalted nuts, nut butters, and seeds. Unsalted canned beans. Lean cuts of beef with fat trimmed off. Low-sodium, lean precooked or cured meat, such as sausages or meat loaves. Dairy Low-fat (1%) or fat-free (skim) milk. Reduced-fat, low-fat, or fat-free cheeses. Nonfat, low-sodium ricotta or cottage cheese. Low-fat or nonfat yogurt. Low-fat, low-sodium cheese. Fats and oils Soft margarine without trans fats. Vegetable oil. Reduced-fat, low-fat, or light mayonnaise and salad dressings (reduced-sodium). Canola, safflower, olive, avocado, soybean, and sunflower oils. Avocado. Seasonings and condiments Herbs. Spices. Seasoning mixes without salt. Other foods Unsalted popcorn and  pretzels. Fat-free sweets. The items listed above may not be all the foods and drinks you can have. Talk to a dietitian to learn more. What foods should I avoid? Fruits Canned fruit in a light or heavy syrup. Fried fruit. Fruit in cream or butter sauce. Vegetables Creamed or fried vegetables. Vegetables in a cheese sauce. Regular canned vegetables that are not marked as low-sodium or reduced-sodium. Regular canned tomato sauce and paste that are not marked as low-sodium or reduced-sodium. Regular tomato and vegetable juices that are not marked as low-sodium or reduced-sodium. Vanessa General. Olives. Grains Baked goods made with fat, such as croissants, muffins, or some breads. Dry pasta or rice meal packs. Meats and other proteins Fatty cuts of meat. Ribs. Fried meat. Helene Loader. [name], salami, and other precooked or cured meats, such as sausages or meat loaves, that are not lean and low in sodium. Fat from the back of a pig (fatback). Bratwurst. Salted nuts and seeds. Canned beans with added salt. Canned or smoked fish. Whole eggs or egg yolks. Chicken or [name] with skin. Dairy Whole or 2% milk, cream, and half-and-half. Whole or full-fat cream cheese. Whole-fat or sweetened yogurt. Full-fat cheese. Nondairy creamers. Whipped toppings. Processed cheese and cheese spreads. Fats and oils  Butter. Stick margarine. Lard. Shortening. Ghee. Bacon fat. Tropical oils, such as coconut, palm kernel, or palm oil. Seasonings and condiments Onion salt, garlic salt, seasoned salt, table salt, and sea salt. Worcestershire sauce. Tartar sauce. Barbecue sauce. Teriyaki sauce. Soy sauce, including reduced-sodium soy sauce. Steak sauce. Canned and packaged gravies. Fish sauce. Oyster sauce. Cocktail sauce. Store-bought horseradish. Ketchup. Mustard. Meat flavorings and tenderizers. Bouillon cubes. Hot sauces. Pre-made or packaged marinades. Pre-made or packaged taco seasonings. Relishes. Regular salad dressings. Other  foods Salted popcorn and pretzels. The items listed above may not be all the foods and drinks you should avoid. Talk to a dietitian to learn more. Where to find more information National Heart, Lung, and Blood Institute (NHLBI): BuffaloDryCleaner.gl American Heart Association (AHA): heart.org Academy of Nutrition and Dietetics: eatright.org National Kidney Foundation (NKF): kidney.org This information is not intended to replace advice given to you by your health care provider. Make sure you discuss any questions you have with your health care provider. Document Revised: 05/31/2022 Document Reviewed: 05/31/2022 Elsevier Patient Education  2024 Elsevier Inc.  Heart-Healthy Eating Plan Eating a healthy diet is important for the health of your heart. A heart-healthy eating plan includes: Eating less unhealthy fats. Eating more healthy fats. Eating less salt in your food. Salt is also called sodium. Making other changes in your diet. Talk with your doctor or a diet specialist (dietitian) to create an eating plan that is right for you. What is my plan? Your doctor may recommend an eating plan that includes: Total fat: ______% or less of total calories a day. Saturated fat: ______% or less of total calories a day. Cholesterol: less than _________mg a day. Sodium: less than _________mg a day. What are tips for following this plan? Cooking Avoid frying your food. Try to bake, boil, grill, or broil it instead. You can also reduce fat by: Removing the skin from poultry. Removing all visible fats from meats. Steaming vegetables in water  or broth. Meal planning  At meals, divide your plate into four equal parts: Fill one-half of your plate with vegetables and green salads. Fill one-fourth of your plate with whole grains. Fill one-fourth of your plate with lean protein foods. Eat 2-4 cups of vegetables per day. One cup of vegetables is: 1 cup (91 g) broccoli or cauliflower florets. 2 medium  carrots. 1 large bell pepper. 1 large sweet potato. 1 large tomato. 1 medium white potato. 2 cups (150 g) raw leafy greens. Eat 1-2 cups of fruit per day. One cup of fruit is: 1 small apple 1 large banana 1 cup (237 g) mixed fruit, 1 large orange,  cup (82 g) dried fruit, 1 cup (240 mL) 100% fruit juice. Eat more foods that have soluble fiber. These are apples, broccoli, carrots, beans, peas, and barley. Try to get 20-30 g of fiber per day. Eat 4-5 servings of nuts, legumes, and seeds per week: 1 serving of dried beans or legumes equals  cup (90 g) cooked. 1 serving of nuts is  oz (12 almonds, 24 pistachios, or 7 walnut halves). 1 serving of seeds equals  oz (8 g). General information Eat more home-cooked food. Eat less restaurant, buffet, and fast food. Limit or avoid alcohol. Limit foods that are high in starch and sugar. Avoid fried foods. Lose weight if you are overweight. Keep track of how much salt (sodium) you eat. This is important if you have high blood pressure. Ask your doctor to tell you more about this. Try to add vegetarian  meals each week. Fats Choose healthy fats. These include olive oil and canola oil, flaxseeds, walnuts, almonds, and seeds. Eat more omega-3 fats. These include salmon, mackerel, sardines, tuna, flaxseed oil, and ground flaxseeds. Try to eat fish at least 2 times each week. Check food labels. Avoid foods with trans fats or high amounts of saturated fat. Limit saturated fats. These are often found in animal products, such as meats, butter, and cream. These are also found in plant foods, such as palm oil, palm kernel oil, and coconut oil. Avoid foods with partially hydrogenated oils in them. These have trans fats. Examples are stick margarine, some tub margarines, cookies, crackers, and other baked goods. What foods should I eat? Fruits All fresh, canned (in natural juice), or frozen fruits. Vegetables Fresh or frozen vegetables (raw,  steamed, roasted, or grilled). Green salads. Grains Most grains. Choose whole wheat and whole grains most of the time. Rice and pasta, including brown rice and pastas made with whole wheat. Meats and other proteins Lean, well-trimmed beef, veal, pork, and lamb. Chicken and [name] without skin. All fish and shellfish. Wild duck, rabbit, pheasant, and venison. Egg whites or low-cholesterol egg substitutes. Dried beans, peas, lentils, and tofu. Seeds and most nuts. Dairy Low-fat or nonfat cheeses, including ricotta and mozzarella. Skim or 1% milk that is liquid, powdered, or evaporated. Buttermilk that is made with low-fat milk. Nonfat or low-fat yogurt. Fats and oils Non-hydrogenated (trans-free) margarines. Vegetable oils, including soybean, sesame, sunflower, olive, peanut, safflower, corn, canola, and cottonseed. Salad dressings or mayonnaise made with a vegetable oil. Beverages Mineral water . Coffee and tea. Diet carbonated beverages. Sweets and desserts Sherbet, gelatin, and fruit ice. Small amounts of dark chocolate. Limit all sweets and desserts. Seasonings and condiments All seasonings and condiments. The items listed above may not be a complete list of foods and drinks you can eat. Contact a dietitian for more options. What foods should I avoid? Fruits Canned fruit in heavy syrup. Fruit in cream or butter sauce. Fried fruit. Limit coconut. Vegetables Vegetables cooked in cheese, cream, or butter sauce. Fried vegetables. Grains Breads that are made with saturated or trans fats, oils, or whole milk. Croissants. Sweet rolls. Donuts. High-fat crackers, such as cheese crackers. Meats and other proteins Fatty meats, such as hot dogs, ribs, sausage, bacon, rib-eye roast or steak. High-fat deli meats, such as salami and bologna. Caviar. Domestic duck and goose. Organ meats, such as liver. Dairy Cream, sour cream, cream cheese, and creamed cottage cheese. Whole-milk cheeses. Whole or 2% milk  that is liquid, evaporated, or condensed. Whole buttermilk. Cream sauce or high-fat cheese sauce. Yogurt that is made from whole milk. Fats and oils Meat fat, or shortening. Cocoa butter, hydrogenated oils, palm oil, coconut oil, palm kernel oil. Solid fats and shortenings, including bacon fat, salt pork, lard, and butter. Nondairy cream substitutes. Salad dressings with cheese or sour cream. Beverages Regular sodas and juice drinks with added sugar. Sweets and desserts Frosting. Pudding. Cookies. Cakes. Pies. Milk chocolate or white chocolate. Buttered syrups. Full-fat ice cream or ice cream drinks. The items listed above may not be a complete list of foods and drinks to avoid. Contact a dietitian for more information. Summary Heart-healthy meal planning includes eating less unhealthy fats, eating more healthy fats, and making other changes in your diet. Eat a balanced diet. This includes fruits and vegetables, low-fat or nonfat dairy, lean protein, nuts and legumes, whole grains, and heart-healthy oils and fats. This information is not intended to replace  advice given to you by your health care provider. Make sure you discuss any questions you have with your health care provider. Document Revised: 06/19/2021 Document Reviewed: 06/19/2021 Elsevier Patient Education  2024 [employer].

## 2023-10-08 ENCOUNTER — Other Ambulatory Visit: Payer: Self-pay | Admitting: Family Medicine

## 2023-10-08 DIAGNOSIS — J441 Chronic obstructive pulmonary disease with (acute) exacerbation: Secondary | ICD-10-CM

## 2023-10-10 ENCOUNTER — Encounter: Payer: Self-pay | Admitting: Family Medicine

## 2023-10-10 ENCOUNTER — Other Ambulatory Visit: Payer: Self-pay | Admitting: Family Medicine

## 2023-10-10 ENCOUNTER — Ambulatory Visit (INDEPENDENT_AMBULATORY_CARE_PROVIDER_SITE_OTHER): Admitting: Family Medicine

## 2023-10-10 ENCOUNTER — Ambulatory Visit (INDEPENDENT_AMBULATORY_CARE_PROVIDER_SITE_OTHER)

## 2023-10-10 VITALS — BP 145/67 | HR 83 | Temp 98.0°F | Ht 61.75 in | Wt 124.0 lb

## 2023-10-10 DIAGNOSIS — J441 Chronic obstructive pulmonary disease with (acute) exacerbation: Secondary | ICD-10-CM

## 2023-10-10 DIAGNOSIS — M545 Low back pain, unspecified: Secondary | ICD-10-CM | POA: Diagnosis not present

## 2023-10-10 DIAGNOSIS — M5416 Radiculopathy, lumbar region: Secondary | ICD-10-CM

## 2023-10-10 DIAGNOSIS — M47816 Spondylosis without myelopathy or radiculopathy, lumbar region: Secondary | ICD-10-CM | POA: Diagnosis not present

## 2023-10-10 DIAGNOSIS — I10 Essential (primary) hypertension: Secondary | ICD-10-CM | POA: Diagnosis not present

## 2023-10-10 MED ORDER — PREGABALIN 300 MG PO CAPS: 300.0000 mg | ORAL_CAPSULE | Freq: Every day | ORAL | 3 refills | Status: DC

## 2023-10-10 MED ORDER — PREDNISONE 20 MG PO TABS: ORAL_TABLET | ORAL | 0 refills | Status: DC

## 2023-10-10 NOTE — Progress Notes (Signed)
 Subjective:  Patient ID: [name], female    DOB: 1941-01-30  Age: 83 y.o. MRN: [id number]  CC: Medical Management of Chronic Issues (Ongoing pain that has gotten worse since we saw her. Lower back, spine to legs. And experiencing groin pain. Constant pain. )   HPI [name] presents for back pain for a month that is increasing onset was shortly after she was last seen here.  The pain is in the lumbar spine and radiating to both legs from the midline.  There is some radiation into the right inguinal area as well.  The pain is constant it is a dull ache with occasional electrical sensation radiating 7-9/10.  She has had multiple issues with accelerated hypertension.  This is still not quite at goal but is improving.  Meds agree with her.   Follow-up of hypertension. Patient has no history of headache chest pain or shortness of breath or recent cough. Patient also denies symptoms of TIA such as numbness weakness lateralizing. Patient checks  blood pressure at home and has not had any elevated readings recently. Patient denies side effects from his medication. States taking it regularly.      10/10/2023    9:00 AM 10/07/2023    9:24 AM 09/06/2023    9:29 AM  Depression screen PHQ 2/9  Decreased Interest 2 0 0  Down, Depressed, Hopeless 2 1 1   PHQ - 2 Score 4 1 1   Altered sleeping 2    Tired, decreased energy 3    Change in appetite 3    Feeling bad or failure about yourself  2    Trouble concentrating 2    Moving slowly or fidgety/restless 2    Suicidal thoughts 0    PHQ-9 Score 18    Difficult doing work/chores Very difficult      History [place] has a past medical history of Acute idiopathic pericarditis (07/15/2023), Agatston coronary artery calcium  score between 200 and 399 (07/15/2023), Arthritis, Atypical chest pain (03/19/2022), CHF (congestive heart failure) (HCC), GERD (gastroesophageal reflux disease), Headache(784.0), Hepatitis, History of gout, Hypertension,  Hypertensive encephalopathy, 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   She has a past surgical history that includes Abdominal hysterectomy; Anterior cervical corpectomy (03/07/2012); Tonsillectomy; Multiple tooth extractions; Cataract extraction w/PHACO (Left, 04/12/2017); Cataract extraction w/PHACO (Right, 05/03/2017); and LEFT HEART CATH AND CORONARY ANGIOGRAPHY (N/A, 07/11/2023).   Her family history includes Asthma in her daughter; Bipolar disorder in her daughter, daughter, daughter, daughter, and son; Drug abuse in her daughter; Heart disease in her daughter and daughter; Hypertension in her daughter. She was adopted.She reports that she quit smoking about 18 months ago. Her smoking use included cigarettes. She started smoking about 63 years ago. She has a 30.9 pack-year smoking history. She has been exposed to tobacco smoke. She has never used smokeless tobacco. She reports that she does not currently use alcohol. She reports that she does not use drugs.    ROS Review of Systems  Constitutional: Negative.   HENT: Negative.    Eyes:  Negative for visual disturbance.  Respiratory:  Negative for shortness of breath.   Cardiovascular:  Negative for chest pain.  Gastrointestinal:  Negative for abdominal pain.  Musculoskeletal:  Negative for arthralgias.  Lumbar x-ray shows lumbar spondylosis changes throughout.Preliminary reading done by Babetta [name]   Objective:  BP (!) 145/67   Pulse 83   Temp 98 F (36.7 C)   Ht 5' 1.75" (1.568 m)  Wt 124 lb (56.2 kg)   SpO2 94%   BMI 22.86 kg/m   BP Readings from Last 3 Encounters:  10/10/23 (!) 145/67  10/07/23 (!) 178/82  09/26/23 (!) 240/109    Wt Readings from Last 3 Encounters:  10/10/23 124 lb (56.2 kg)  10/07/23 123 lb (55.8 kg)  09/26/23 123 lb 6.4 oz (56 kg)     Physical Exam Constitutional:      General: She is not in acute distress.     Appearance: She is well-developed.  Cardiovascular:     Rate and Rhythm: Normal rate and regular rhythm.  Pulmonary:     Breath sounds: Normal breath sounds.  Musculoskeletal:        General: Tenderness (Lower lumbar paraspinous musculature bilaterally.) present. Normal range of motion.  Skin:    General: Skin is warm and dry.  Neurological:     Mental Status: She is alert and oriented to person, place, and time.   Lower lumbar paraspinous musculature bilaterally.   Assessment & Plan:  Lumbar radiculopathy -     DG Lumbar Spine 2-3 Views; Future  Essential hypertension with goal blood pressure less than 130/80  Other orders -     predniSONE ; One twice daily with food for 2 weeks. Then one daily for 2 weeks  Dispense: 42 tablet; Refill: 0 -     Pregabalin ; Take 1 capsule (300 mg total) by mouth at bedtime. To reduce back pain  Dispense: 90 capsule; Refill: 3     Follow-up: Return in about 4 weeks (around 11/07/2023).  Roise Cleaver, M.D.

## 2023-10-11 DIAGNOSIS — Z008 Encounter for other general examination: Secondary | ICD-10-CM | POA: Diagnosis not present

## 2023-10-11 DIAGNOSIS — M6281 Muscle weakness (generalized): Secondary | ICD-10-CM | POA: Diagnosis not present

## 2023-10-13 ENCOUNTER — Ambulatory Visit: Payer: Self-pay | Admitting: Family Medicine

## 2023-10-14 ENCOUNTER — Ambulatory Visit (HOSPITAL_BASED_OUTPATIENT_CLINIC_OR_DEPARTMENT_OTHER): Payer: Self-pay | Admitting: Cardiovascular Disease

## 2023-10-29 ENCOUNTER — Ambulatory Visit (HOSPITAL_BASED_OUTPATIENT_CLINIC_OR_DEPARTMENT_OTHER): Admitting: Pharmacist Clinician (PhC)/ Clinical Pharmacy Specialist

## 2023-10-29 ENCOUNTER — Encounter (HOSPITAL_BASED_OUTPATIENT_CLINIC_OR_DEPARTMENT_OTHER): Payer: Self-pay

## 2023-10-29 VITALS — BP 142/80 | HR 76 | Ht 65.75 in | Wt 135.5 lb

## 2023-10-29 DIAGNOSIS — I10 Essential (primary) hypertension: Secondary | ICD-10-CM

## 2023-10-29 MED ORDER — CLONIDINE HCL 0.2 MG PO TABS
ORAL_TABLET | ORAL | 3 refills | Status: DC
Start: 2023-10-29 — End: 2024-04-16

## 2023-10-29 NOTE — Progress Notes (Signed)
 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  pericarditis 2/25 on colchicine  and NSAID (ibuprofen )    Allergies  Allergen Reactions   Penicillins Anaphylaxis, Swelling, Rash and Other (See Comments)    Has patient had a PCN reaction causing immediate rash, facial/tongue/throat swelling, SOB or lightheadedness with hypotension: yes Has patient had a PCN reaction causing severe rash involving mucus membranes or skin necrosis: no Has patient had a PCN reaction that required hospitalization: yes Has patient had a PCN reaction occurring within the last 10 years: no If all of the above answers are "NO", then may proceed with Cephalosporin use.     History of Present Illness    [name] is a 83 y.o. female patient of Dr Theodis Fiscal, in the office today for hypertension evaluation.  She was seen just a month ago, pressure 240/109 in the office.  Patient reports that home readings dropped significantly in the past month, with the highest reading at 135 systolic.  This is despite having started prednisone  20 mg BID in mid-May (has 2-3 doses remaining)  Today she returns for follow up.  No home readings, but states doing well.  Office reading has definitely improved (142/80) since last visit, but still not at goal.    Blood Pressure Goal:  130/80  Current Medications:  amlodipine -olmesartan  10-40 mg every day, clonidine  0.2 mg tid, hydralazine  100 mg tid,   Previously tried:   Metoprolol  - fatigue Bisoprolol  Hydrochlorothiazide  - RAS Lisinopril  - changed to ARB Clonidine   patch - itching Valsartan  - RAS Losartan  - RAS  Family Hx:  adopted; 5 daughters, 1 son; all daughters with hypertension, anxiety, arthritis   Social Hx:      Tobacco: quit about 1 year ago, restarted, now 4-5 months without  Alcohol: no  Caffeine: coffee to start the day  Diet:  home cooked meals mostly; protein is mostly chicken and fish; loves vegetables - fresh/frozen and canned (beets mostly) doestn' snack much  Exercise: alikng 1 mile per day  Home BP readings:  130/60-70's at home (6 am)  higherst 135 highest in past 2 weeks; arm cuff about 71-42 years old     Accessory Clinical Findings    Lab Results  Component Value Date   CREATININE 1.02 (H) 08/06/2023   BUN 16 08/06/2023   NA 142 08/06/2023   K 3.7 08/06/2023   CL 104 08/06/2023   CO2 24 08/06/2023   Lab Results  Component Value Date   ALT 9 07/29/2023   AST 14 07/29/2023   ALKPHOS 66 07/29/2023   BILITOT 0.4 07/29/2023   Lab Results  Component Value Date   HGBA1C 5.7 (H) 02/12/2012    Home Medications    Current Outpatient Medications  Medication Sig Dispense Refill   acetaminophen  (TYLENOL ) 500 MG tablet Take 2 tablets (1,000 mg total) by mouth 3 (three) times daily. 180 tablet PRN   albuterol  (VENTOLIN  HFA) 108 (90 Base) MCG/ACT inhaler INHALE 2 PUFFS INTO THE LUNGS EVERY 6 HOURS AS NEEDED FOR WHEEZE OR SHORTNESS OF BREATH 6.7 each 0  alendronate  (FOSAMAX ) 70 MG tablet Take 1 tablet (70 mg total) by mouth every 7 (seven) days. Take with a full glass of water  on an empty stomach. 13 tablet 3   amLODipine -olmesartan  (AZOR ) 10-40 MG tablet Take 1 tablet by mouth daily. For blood pressure. 90 tablet 1   arformoterol  (BROVANA ) 15 MCG/2ML NEBU Take 2 mLs (15 mcg total) by nebulization 2 (two) times daily. 360 mL 3   atorvastatin  (LIPITOR ) 80 MG tablet Take 1 tablet (80 mg total) by mouth daily. (Patient taking differently: Take 80 mg by mouth at bedtime.) 90 tablet 3   budesonide  (PULMICORT ) 0.5 MG/2ML  nebulizer solution Take 2 mLs (0.5 mg total) by nebulization 2 (two) times daily. 360 mL 3   budesonide -formoterol  (SYMBICORT ) 160-4.5 MCG/ACT inhaler INHALE 2 PUFFS INTO THE LUNGS TWICE A DAY 10.2 each 0   cloNIDine  (CATAPRES ) 0.2 MG tablet Take 1.5 tablets morning and evening, and 1 tablet mid-day. 120 tablet 3   clopidogrel  (PLAVIX ) 75 MG tablet Take 1 tablet (75 mg total) by mouth daily. 90 tablet 3   furosemide  (LASIX ) 20 MG tablet Take 1 tablet (20 mg total) by mouth daily. For swelling 90 tablet 1   hydrALAZINE  (APRESOLINE ) 100 MG tablet Take 1 tablet (100 mg total) by mouth 3 (three) times daily. 270 tablet 3   Magnesium  250 MG TABS Take 1 tablet (250 mg total) by mouth daily. 100 tablet PRN   potassium chloride  SA (KLOR-CON  M) 20 MEQ tablet Take 1 tablet (20 mEq total) by mouth daily. For potassium replacement/ supplement 30 tablet 5   predniSONE  (DELTASONE ) 20 MG tablet One twice daily with food for 2 weeks. Then one daily for 2 weeks 42 tablet 0   QUEtiapine  (SEROQUEL ) 25 MG tablet Take 1 tablet (25 mg total) by mouth at bedtime. 90 tablet 1   rOPINIRole  (REQUIP ) 1 MG tablet TAKE 1 TABLET (1 MG TOTAL) BY MOUTH AT BEDTIME. FOR LEG CRAMPS 90 tablet 1   SUMAtriptan  (IMITREX ) 100 MG tablet Take one at onset of HA. May repeat in 2 hours if headache persists or recurs. Limit two per 24 hours 10 tablet 11   vitamin B-12 1000 MCG tablet Take 1 tablet (1,000 mcg total) by mouth daily. 30 tablet 2   Vitamin D , Ergocalciferol , (DRISDOL ) 1.25 MG (50000 UNIT) CAPS capsule Take 1 capsule (50,000 Units total) by mouth every 7 (seven) days. 13 capsule 3   colchicine  0.6 MG tablet Take 1 tablet (0.6 mg total) by mouth daily. 90 tablet 0   pantoprazole  (PROTONIX ) 40 MG tablet Take 1 tablet (40 mg total) by mouth daily. 30 tablet 0   pregabalin  (LYRICA ) 300 MG capsule Take 1 capsule (300 mg total) by mouth at bedtime. To reduce back pain (Patient not taking: Reported on 10/29/2023) 90 capsule 3   No current  facility-administered medications for this visit.     HYPERTENSION CONTROL Vitals:   10/29/23 0859 10/29/23 0934  BP: 126/86 (!) 142/80    The patient's blood pressure is elevated above target today.  In order to address the patient's elevated BP:       Assessment & Plan    Assessment: BP is uncontrolled in office BP 142/80 mmHg;  above the goal (<130/80). Tolerates amlodipine , olmesartan , clonidine  and hydralazine  well, without any side effects Multiple medication intolerances/contraindications; not many options - doxazosin or minoxidil (but this is another vasodilator, probably not much further benefit) Denies SOB, palpitation, chest pain, headaches,or swelling Reiterated the importance of regular exercise  and low salt diet  3/4 medications at maximum dose.   Plan:  Increase clonidine  to 0.3 mg in morning and evening, leaving mid-day dose at 0.2 mg Continue taking amlodipine -olmesartan  10-40 mg every day, hydralazine  100 mg tid, Patient to keep record of BP readings with heart rate and report to us  at the next visit Patient to follow up with Dr. Theodis Fiscal later this month  Labs ordered today:  none  Donivan Furry PharmD CPP [facility]  [address] [place], [place] [address] [phone]

## 2023-10-29 NOTE — Assessment & Plan Note (Signed)
 Assessment: BP is uncontrolled in office BP 142/80 mmHg;  above the goal (<130/80). No home readings with her Tolerates amlodipine , olmesartan , clonidine  and hydralazine  well, without any side effects Denies palpitation, chest pain, headaches,or swelling; occasional SOB with exertion Reiterated the importance of regular exercise and low salt diet   Plan:  Increase clonidine  to 0.3 mg in the morning and night. Continue with 0.2 mg mid-day Continue taking amlodipine -olmesartan  10/40 daily and hydralazine  100 mg tid  Patient to keep record of BP readings with heart rate and report to us  at the next visit Patient to follow up with Dr. Theodis Fiscal in 3 weeks  Labs ordered today:  none

## 2023-10-29 NOTE — Patient Instructions (Signed)
 Follow up appointment: with Dr. Theodis Fiscal at the end of the month  Take your BP meds as follows:  Increase morning and evening clonidine  doses to 1.5 tablets ( 0.3 mg).  Leave afternoon and dose at just 1 tablet.  Continue with all other medications  Check your blood pressure at home daily (if able) and keep record of the readings.  Your blood pressure goal is < 130/80  To check your pressure at home you will need to:  1. Sit up in a chair, with feet flat on the floor and back supported. Do not cross your ankles or legs. 2. Rest your left arm so that the cuff is about heart level. If the cuff goes on your upper arm,  then just relax the arm on the table, arm of the chair or your lap. If you have a wrist cuff, we  suggest relaxing your wrist against your chest (think of it as Pledging the Flag with the  wrong arm).  3. Place the cuff snugly around your arm, about 1 inch above the crook of your elbow. The  cords should be inside the groove of your elbow.  4. Sit quietly, with the cuff in place, for about 5 minutes. After that 5 minutes press the power  button to start a reading. 5. Do not talk or move while the reading is taking place.  6. Record your readings on a sheet of paper. Although most cuffs have a memory, it is often  easier to see a pattern developing when the numbers are all in front of you.  7. You can repeat the reading after 1-3 minutes if it is recommended  Make sure your bladder is empty and you have not had caffeine or tobacco within the last 30 min  Always bring your blood pressure log with you to your appointments. If you have not brought your monitor in to be double checked for accuracy, please bring it to your next appointment.  You can find a list of quality blood pressure cuffs at [email or web address]  Important lifestyle changes to control high blood pressure  Intervention  Effect on the BP  Lose extra pounds and watch your waistline Weight loss is one of the most  effective lifestyle changes for controlling blood pressure. If you're overweight or obese, losing even a small amount of weight can help reduce blood pressure. Blood pressure might go down by about 1 millimeter of mercury (mm Hg) with each kilogram (about 2.2 pounds) of weight lost.  Exercise regularly As a general goal, aim for at least 30 minutes of moderate physical activity every day. Regular physical activity can lower high blood pressure by about 5 to 8 mm Hg.  Eat a healthy diet Eating a diet rich in whole grains, fruits, vegetables, and low-fat dairy products and low in saturated fat and cholesterol. A healthy diet can lower high blood pressure by up to 11 mm Hg.  Reduce salt (sodium) in your diet Even a small reduction of sodium in the diet can improve heart health and reduce high blood pressure by about 5 to 6 mm Hg.  Limit alcohol One drink equals 12 ounces of beer, 5 ounces of wine, or 1.5 ounces of 80-proof liquor.  Limiting alcohol to less than one drink a day for women or two drinks a day for men can help lower blood pressure by about 4 mm Hg.   If you have any questions or concerns please use My Chart to send questions or  call the office at [phone]

## 2023-10-29 NOTE — Assessment & Plan Note (Addendum)
 Assessment: BP is uncontrolled in office BP 142/80 mmHg;  above the goal (<130/80). Tolerates amlodipine , olmesartan , clonidine  and hydralazine  well, without any side effects Multiple medication intolerances/contraindications; not many options - doxazosin or minoxidil (but this is another vasodilator, probably not much further benefit) Denies SOB, palpitation, chest pain, headaches,or swelling Reiterated the importance of regular exercise and low salt diet  3/4 medications at maximum dose.   Plan:  Increase clonidine  to 0.3 mg in morning and evening, leaving mid-day dose at 0.2 mg Continue taking amlodipine -olmesartan  10-40 mg every day, hydralazine  100 mg tid, Patient to keep record of BP readings with heart rate and report to us  at the next visit Patient to follow up with Dr. Theodis Fiscal later this month  Labs ordered today:  none

## 2023-11-04 ENCOUNTER — Other Ambulatory Visit: Payer: Self-pay | Admitting: Family Medicine

## 2023-11-04 ENCOUNTER — Other Ambulatory Visit: Payer: Self-pay | Admitting: Licensed Clinical Social Worker

## 2023-11-04 ENCOUNTER — Other Ambulatory Visit: Payer: Self-pay | Admitting: *Deleted

## 2023-11-04 DIAGNOSIS — Z8659 Personal history of other mental and behavioral disorders: Secondary | ICD-10-CM

## 2023-11-04 DIAGNOSIS — J441 Chronic obstructive pulmonary disease with (acute) exacerbation: Secondary | ICD-10-CM

## 2023-11-04 NOTE — Patient Instructions (Signed)
 Visit Information  Thank you for taking time to visit with me today. Please don't hesitate to contact me if I can be of assistance to you before our next scheduled appointment.  Your next care management appointment is by telephone on 11/18/2023 at 11am  Telephone follow up appointment date/time:  11/18/2023 11am  Please call the care guide team at [phone] if you need to cancel, schedule, or reschedule an appointment.   Please call 911 if you are experiencing a Mental Health or Behavioral Health Crisis or need someone to talk to.  Fletcher Humble MSW, LCSW Licensed Clinical Social Worker  [facility] [place], Population Health Direct Dial: [phone]  Fax: [phone]

## 2023-11-04 NOTE — Patient Outreach (Signed)
 Complex Care Management   Visit Note  11/04/2023  Name:  [name] MRN: [id number] DOB: February 01, 1941  Situation: Referral received for Complex Care Management related to COPD and HTN I obtained verbal consent from Patient.  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   Stroke (HCC)    SLIGHT RT SIDE WEAKNESS 2001   Unintentional weight loss 03/19/2022    Assessment: Patient Reported Symptoms:  Cognitive Cognitive Status: Alert and oriented to person, place, and time Cognitive/Intellectual Conditions Management [RPT]: None reported or documented in medical history or problem list   Health Maintenance Behaviors: Annual physical exam, Exercise, Healthy diet Healing Pattern: Average Health Facilitated by: Healthy diet, Rest, Prayer/meditation  Neurological Neurological Review of Symptoms: No symptoms reported    HEENT HEENT Symptoms Reported: Other:, Ear pain Other HEENT Symptoms/Conditions: states wax build up and going to get ears cleaned      Cardiovascular Cardiovascular Symptoms Reported: No symptoms reported Does patient have uncontrolled Hypertension?: Yes Is patient checking Blood Pressure at home?: Yes Patient's Recent BP reading at home: 130/73 Cardiovascular Conditions: High blood cholesterol, Hypertension Cardiovascular Management Strategies: Medication therapy Cardiovascular Self-Management Outcome: 3 (uncertain)  Respiratory Respiratory Symptoms  Reported: No symptoms reported Respiratory Conditions: COPD Respiratory Self-Management Outcome: 3 (uncertain)  Endocrine Patient reports the following symptoms related to hypoglycemia or hyperglycemia : No symptoms reported Is patient diabetic?: No    Gastrointestinal Gastrointestinal Symptoms Reported: Flatulence Additional Gastrointestinal Details: passing gas with some discomfort   Nutrition Risk Screen (CP): Reduced oral intake over the last month  Genitourinary Genitourinary Symptoms Reported: Incontinence Genitourinary Conditions: Incontinence Genitourinary Management Strategies: Incontinence garment/pad Genitourinary Self-Management Outcome: 3 (uncertain)  Integumentary Integumentary Symptoms Reported: No symptoms reported    Musculoskeletal Musculoskelatal Symptoms Reviewed: Weakness Additional Musculoskeletal Details: generalized, states from RA Musculoskeletal Conditions: Joint pain Musculoskeletal Management Strategies: Adequate rest, Exercise Musculoskeletal Self-Management Outcome: 3 (uncertain) Falls in the past year?: Yes Number of falls in past year: 2 or more Was there an injury with Fall?: Yes Fall Risk Category Calculator: 3 Patient Fall Risk Level: High Fall Risk Patient at Risk for Falls Due to: History of fall(s), Impaired balance/gait Fall risk Follow up: Falls evaluation completed  Psychosocial Psychosocial Symptoms Reported: Alteration in eating habits Behavioral Health Conditions: Depression Behavioral Management Strategies: Support system Behavioral Health Self-Management Outcome: 3 (uncertain) Major Change/Loss/Stressor/Fears (CP): Medical condition, self Techniques to Cope with Loss/Stress/Change: Meditation, Spiritual practice(s), Exercise, Diversional activities Quality of Family Relationships: helpful, involved, supportive Do you feel physically threatened by others?: No      11/04/2023    9:46 AM  Depression screen PHQ 2/9  Decreased Interest 2   Down, Depressed, Hopeless 2  PHQ - 2 Score 4  Altered sleeping 0  Tired, decreased energy 3  Change in appetite 3  Feeling bad or failure about yourself  3  Trouble concentrating 2  Moving slowly or fidgety/restless 0  Suicidal thoughts 1  PHQ-9 Score 16  Difficult doing work/chores Very difficult    Vitals:   11/04/23 0920  BP: 130/73  Pulse: 95    Medications Reviewed Today     Reviewed by Gilberto Labella, RN (Registered Nurse) on 11/04/23 at 1018  Med List Status: <None>   Medication Order Taking? Sig Documenting Provider Last Dose Status Informant  acetaminophen  (TYLENOL ) 500 MG tablet [id number] Yes Take 2 tablets (1,000 mg total) by mouth 3 (three) times daily. Roise Cleaver, MD Taking Active Self, Pharmacy Records  albuterol  (VENTOLIN  HFA) 108 [phone] Base) MCG/ACT inhaler [id number] Yes INHALE 2 PUFFS INTO THE LUNGS EVERY 6 HOURS AS NEEDED FOR WHEEZE OR SHORTNESS OF Karrie Paddy, MD Taking Active   alendronate  (FOSAMAX ) 70 MG tablet [id number] Yes Take 1 tablet (70 mg total) by mouth every 7 (seven) days. Take with a full glass of water  on an empty stomach. Roise Cleaver, MD Taking Active Self, Pharmacy Records  amLODipine -olmesartan  (AZOR ) 10-40 MG tablet [id number] Yes Take 1 tablet by mouth daily. For blood pressure. Roise Cleaver, MD Taking Active   arformoterol  (BROVANA ) 15 MCG/2ML NEBU [id number] Yes Take 2 mLs (15 mcg total) by nebulization 2 (two) times daily. Hunsucker, Archer Kobs, MD Taking Active Self, Pharmacy Records  atorvastatin  (LIPITOR ) 80 MG tablet [id number] Yes Take 1 tablet (80 mg total) by mouth daily.  Patient taking differently: Take 80 mg by mouth at bedtime.   Clearnce Curia, NP Taking Active Self, Pharmacy Records  budesonide  (PULMICORT ) 0.5 MG/2ML nebulizer solution [id number] Yes Take 2 mLs (0.5 mg total) by nebulization 2 (two) times daily. Hunsucker, Archer Kobs, MD Taking Active Self, Pharmacy Records  budesonide -formoterol  (SYMBICORT )  160-4.5 MCG/ACT inhaler [id number] Yes INHALE 2 PUFFS INTO THE LUNGS TWICE A Sophronia Dutton, MD Taking Active   cloNIDine  (CATAPRES ) 0.2 MG tablet [id number] Yes Take 1.5 tablets morning and evening, and 1 tablet mid-day. Maudine Sos, MD Taking Active   clopidogrel  (PLAVIX ) 75 MG tablet [id number] Yes Take 1 tablet (75 mg total) by mouth daily. Clearnce Curia, NP Taking Active Self, Pharmacy Records  colchicine  0.6 MG tablet [id number]  Take 1 tablet (0.6 mg total) by mouth daily. Tolia, Sunit, DO  Expired 10/14/23 2359   furosemide  (LASIX ) 20 MG tablet [id number] Yes Take 1 tablet (20 mg total) by mouth daily. For swelling Clearnce Curia, NP Taking Active Self, Pharmacy Records  hydrALAZINE  (APRESOLINE ) 100 MG tablet [id number] No Take 1 tablet (100 mg total) by mouth 3 (three) times daily.  Patient not taking: Reported on 11/04/2023   Clearnce Curia, NP Not Taking Active            Med Note Koleen Perna, Aeliana Spates   Mon Nov 04, 2023  9:26 AM) Patient states "easing self off medicines". Not sure of last dose.   isosorbide  mononitrate (IMDUR ) 60 MG 24 hr tablet [id number] Yes Take 60 mg by mouth daily. Patient states prescribed daily for chest pain [provider] Taking Active Self  Magnesium  250 MG TABS [id number] Yes Take 1 tablet (250 mg total) by mouth daily. Roise Cleaver, MD Taking Active Self, Pharmacy Records           Med Note Koleen Perna, [facility]   Mon Nov 04, 2023  9:27 AM) Per patient, takes q 2-3 days  pantoprazole  (PROTONIX ) 40 MG tablet [id number]  Take 1 tablet (40 mg total) by mouth daily. Tolia, Sunit, DO  Expired 08/14/23 2359   potassium chloride  SA (KLOR-CON  M) 20 MEQ tablet [id number] Yes  Take 1 tablet (20 mEq total) by mouth daily. For potassium replacement/ supplement Roise Cleaver, MD Taking Active   predniSONE  (DELTASONE ) 20 MG tablet [id number] No One twice daily with food for 2 Mikolaj Woolstenhulme. Then one daily for 2 Kyrie Fludd  Patient not taking: Reported on 11/04/2023   Roise Cleaver, MD Not Taking Consider Medication Status and Discontinue   pregabalin  (LYRICA ) 300 MG capsule [id number] No Take 1 capsule (300 mg total) by mouth at bedtime. To reduce back pain  Patient not taking: Reported on 11/04/2023   Roise Cleaver, MD Not Taking Consider Medication Status and Discontinue   QUEtiapine  (SEROQUEL ) 25 MG tablet [id number] Yes Take 1 tablet (25 mg total) by mouth at bedtime. Roise Cleaver, MD Taking Active   rOPINIRole  (REQUIP ) 1 MG tablet [id number] Yes TAKE 1 TABLET (1 MG TOTAL) BY MOUTH AT BEDTIME. FOR LEG CRAMPS  Patient taking differently: Take 1 mg by mouth as needed. For leg cramps   Roise Cleaver, MD Taking Active   SUMAtriptan  (IMITREX ) 100 MG tablet [id number] No Take one at onset of HA. May repeat in 2 hours if headache persists or recurs. Limit two per 24 hours  Patient not taking: Reported on 11/04/2023   Roise Cleaver, MD Not Taking Consider Medication Status and Discontinue Self, Pharmacy Records           Med Note [facility] [place], [place] A   Wed Jul 10, 2023  6:22 PM) Prn   vitamin B-12 1000 MCG tablet [id number] No Take 1 tablet (1,000 mcg total) by mouth daily.  Patient not taking: Reported on 11/04/2023   Rayfield Cairo, MD Not Taking Consider Medication Status and Discontinue Self, Pharmacy Records  Vitamin D , Ergocalciferol , (DRISDOL ) 1.25 MG (50000 UNIT) CAPS capsule [id number] No Take 1 capsule (50,000 Units total) by mouth every 7 (seven) days.  Patient not taking: Reported on 11/04/2023   Roise Cleaver, MD Not Taking Consider Medication Status and Discontinue Self, Pharmacy Records           Med Note [facility] [place], [place] A   Wed Jul 10, 2023  6:14 PM)              Recommendation:   PCP Follow-up Continue Current Plan of Care  Follow Up Plan:   Telephone follow-up in 1 month Referral to Licensed Clinical Social Worker: Scheduled for today at 11:00 am  Gilberto Labella, MSN, RN [facility]  [facility],  [facility] RN Care Manager Direct Dial: [phone] Fax: [phone]

## 2023-11-04 NOTE — Patient Instructions (Addendum)
 Visit Information  Thank you for taking time to visit with me today. Please don't hesitate to contact me if I can be of assistance to you before our next scheduled appointment.  Your next care management appointment is by telephone on 12/02/23 at 9:00 am  Telephone follow-up in 1 month  Please call the care guide team at [phone] if you need to cancel, schedule, or reschedule an appointment.   Please call the Suicide and Crisis Lifeline: 988 call the [place]  National Suicide Prevention Lifeline: (712)004-9447 or TTY: 970-653-7199 TTY (269) 822-3347) to talk to a trained counselor call 1-800-273-TALK (toll free, 24 hour hotline) call the [facility]: (250)854-3481 call 911 if you are experiencing a Mental Health or Behavioral Health Crisis or need someone to talk to.  Gilberto Labella, MSN, RN [place]  [facility], [facility] RN Care Manager Direct Dial: [phone] Fax: [phone]    Fall Prevention in the Home, Adult Falls can cause injuries and can happen to people of all ages. There are many things you can do to make your home safer and to help prevent falls. What actions can I take to prevent falls? General information Use good lighting in all rooms. Make sure to: Replace any light bulbs that burn out. Turn on the lights in dark areas and use night-lights. Keep items that you use often in easy-to-reach places. Lower the shelves around your home if needed. Move furniture so that there are clear paths around it. Do not use throw rugs or other things on the floor that can make you trip. If any of your floors are uneven, fix them. Add color or contrast paint or tape to clearly mark and help you see: Grab bars or handrails. First and last steps of staircases. Where the edge of each step is. If you use a ladder or stepladder: Make sure that it is fully opened. Do not climb a closed ladder. Make sure the sides of the ladder are locked  in place. Have someone hold the ladder while you use it. Know where your pets are as you move through your home. What can I do in the bathroom?     Keep the floor dry. Clean up any water  on the floor right away. Remove soap buildup in the bathtub or shower. Buildup makes bathtubs and showers slippery. Use non-skid mats or decals on the floor of the bathtub or shower. Attach bath mats securely with double-sided, non-slip rug tape. If you need to sit down in the shower, use a non-slip stool. Install grab bars by the toilet and in the bathtub and shower. Do not use towel bars as grab bars. What can I do in the bedroom? Make sure that you have a light by your bed that is easy to reach. Do not use any sheets or blankets on your bed that hang to the floor. Have a firm chair or bench with side arms that you can use for support when you get dressed. What can I do in the kitchen? Clean up any spills right away. If you need to reach something above you, use a step stool with a grab bar. Keep electrical cords out of the way. Do not use floor polish or wax that makes floors slippery. What can I do with my stairs? Do not leave anything on the stairs. Make sure that you have a light switch at the top and the bottom of the stairs. Make sure that there are handrails on both sides of  the stairs. Fix handrails that are broken or loose. Install non-slip stair treads on all your stairs if they do not have carpet. Avoid having throw rugs at the top or bottom of the stairs. Choose a carpet that does not hide the edge of the steps on the stairs. Make sure that the carpet is firmly attached to the stairs. Fix carpet that is loose or worn. What can I do on the outside of my home? Use bright outdoor lighting. Fix the edges of walkways and driveways and fix any cracks. Clear paths of anything that can make you trip, such as tools or rocks. Add color or contrast paint or tape to clearly mark and help you see  anything that might make you trip as you walk through a door, such as a raised step or threshold. Trim any bushes or trees on paths to your home. Check to see if handrails are loose or broken and that both sides of all steps have handrails. Install guardrails along the edges of any raised decks and porches. Have leaves, snow, or ice cleared regularly. Use sand, salt, or ice melter on paths if you live where there is ice and snow during the winter. Clean up any spills in your garage right away. This includes grease or oil spills. What other actions can I take? Review your medicines with your doctor. Some medicines can cause dizziness or changes in blood pressure, which increase your risk of falling. Wear shoes that: Have a low heel. Do not wear high heels. Have rubber bottoms and are closed at the toe. Feel good on your feet and fit well. Use tools that help you move around if needed. These include: Canes. Walkers. Scooters. Crutches. Ask your doctor what else you can do to help prevent falls. This may include seeing a physical therapist to learn to do exercises to move better and get stronger. Where to find more information Centers for Disease Control and Prevention, STEADI: TonerPromos.no [employer] on Aging: BaseRingTones.pl National Institute on Aging: BaseRingTones.pl Contact a doctor if: You are afraid of falling at home. You feel weak, drowsy, or dizzy at home. You fall at home. Get help right away if you: Lose consciousness or have trouble moving after a fall. Have a fall that causes a head injury. These symptoms may be an emergency. Get help right away. Call 911. Do not wait to see if the symptoms will go away. Do not drive yourself to the hospital. This information is not intended to replace advice given to you by your health care provider. Make sure you discuss any questions you have with your health care provider. Document Revised: 01/15/2022 Document Reviewed: 01/15/2022 Elsevier  Patient Education  2024 Elsevier Inc.   Suicidal Feelings: How to Help Yourself Suicide is when you end your own life. Suicidal ideation includes expressing thoughts about, or a preoccupation with, ending your own life. There are many things you can do to help yourself feel better when struggling with these feelings. Many services and people are available to support you and others who struggle with similar feelings. If you ever feel like you may hurt yourself or others, or have thoughts about taking your own life, get help right away. To get help: Go to your nearest emergency room. Call 911. Call the [employer] health and human services helpline (211 in the [place] Call or text a suicide hotline to speak with a trained counselor. The following suicide hotlines are available in the [place] : The Suicide &  Crisis Lifeline (free and confidential): Call 6303579716 or 988. Text 551-809-7916. 1-800-SUICIDE 865-614-8927). If you're a Veteran: Call 988 and press 1. Text the [facility] at (602)381-7598. 531-859-9284. This is a hotline for Spanish speakers. 504-793-0630. This is a hotline for TTY users. 1-866-4-U-TREVOR 704-380-6102). This is a hotline for lesbian, gay, bisexual, transgender, or questioning youth. For a list of hotlines in [place], visit AmenCredit.is.html Contact a crisis center or a local suicide prevention center. To find a crisis center or suicide prevention center: Call your local hospital, clinic, community service organization, mental health center, social service provider, or health department. Ask for help with connecting to a crisis center. For a list of crisis centers in the [place] , visit: suicidepreventionlifeline.org For a list of crisis centers in [place], visit: suicideprevention.ca How to help yourself feel better  Promise yourself that you will not do anything bad or extreme when you have suicidal  feelings. Remember the times you have felt hopeful. Many people have gotten through suicidal thoughts and feelings, and you can too. If you have had these feelings before, remind yourself that you can get through them again. Let family, friends, teachers, or counselors know how you are feeling. Do not separate yourself from those who care about you and want to help you. Talk with someone every day, even if you do not feel like talking to anyone or being with other people. Face-to-face conversation is best to help them understand your feelings. Contact a mental health care provider and work with this person regularly. Make a safety plan that you can follow during a crisis. Include phone numbers of suicide prevention hotlines, mental health professionals, and trusted friends and family members you can call during an emergency. Save these numbers on your phone. If you are thinking of taking a lot of medicine, give your medicine to someone who can give it to you as prescribed. If you are on antidepressants and are concerned you will overdose, tell your health care provider so that he or she can give you safer medicines. Try to stick to your routines and follow a schedule every day. Make self-care a priority. Make a list of realistic goals, and cross them off when you achieve them. Accomplishments can give you a sense of worth. Wait until you are feeling better before doing things that you find difficult or unpleasant. Do things that you have always enjoyed to take your mind off your feelings. Try reading a book, or listening to or playing music. Spending time outside, in nature, may help you feel better. Follow these instructions at home:  Visit your primary health care provider every year for a physical and a mental health checkup. Take over-the-counter and prescription medicines only as told by your health care provider. Ask your health care provider about the possible side effects of any  medicines you are taking. Ask your health care provider about whether suicidal ideation is a possible side effect of any of your medicines. Learn about suicidal ideation and what increases the risk for the development of suicidal thoughts. Eat a well-balanced diet, and eat regular meals. Get plenty of rest. Exercise if you are able. Just 30 minutes of exercise each day can help you feel better. Keep your living space well lit. Do not use alcohol or drugs. Remove these substances from your home. General recommendations Remove weapons, poisons, knives, and other deadly items from your home. Work with a mental health care provider as needed. When you are feeling well, write yourself  a letter with tips and support that you can read when you are not feeling well. Remember that life's difficulties can be sorted out with help. Conditions can be treated, and you can learn behaviors and ways of thinking that will help you. Work with your health care provider or counselor to learn ways of coping with your thoughts and feelings. Where to find more information National Suicide Prevention Lifeline: www.suicidepreventionlifeline.org Hopeline: www.hopeline.com [employer] for Suicide Prevention: https://www.ayers.com/ [employer] (for lesbian, gay, bisexual, transgender, or questioning youth): www.thetrevorproject.[employer] of Mental Health: https://ramirez-williams.com/ Suicide Prevention Resources: FarmerBuys.com.au Contact a health care provider if: You feel as though you are a burden to others. You feel agitated, angry, vengeful, or have extreme mood swings. You have withdrawn from family and friends. You are frequently using drugs or alcohol. Get help right away if: You are talking about suicide or wishing to die. You start making plans for how to commit suicide. You feel that you have no reason to live. You start making plans for putting  your affairs in order, saying goodbye, or giving your possessions away. You feel guilt, shame, or unbearable pain, and it seems like there is no way out. You are engaging in risky behaviors that could lead to death. If you have any of these thoughts or symptoms, get help right away: Go to your nearest emergency department or crisis center. Call emergency services (911 in the [place] Call or text a suicide crisis helpline. Summary Suicide is when you take your own life. Suicidal feelings are thoughts about ending your own life. Promise yourself that you will not do anything bad or extreme when you have suicidal feelings. Let family, friends, teachers, or counselors know how you are feeling. Get help right away if you start making plans for how to commit suicide. This information is not intended to replace advice given to you by your health care provider. Make sure you discuss any questions you have with your health care provider. Document Revised: 03/05/2023 Document Reviewed: 09/22/2020 Elsevier Patient Education  2024 [employer].

## 2023-11-04 NOTE — Patient Outreach (Signed)
 Complex Care Management   Visit Note  11/04/2023  Name:  [name] MRN: [id number] DOB: 09-18-1940  Situation: Referral received for Complex Care Management related to Mental/Behavioral Health diagnosis depression I obtained verbal consent from Patient.  Visit completed with Connee Deforest  on the phone. Pt reports desire for in person counseling to address depressed mood and isolation.   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   Stroke (HCC)    SLIGHT RT SIDE WEAKNESS 2001   Unintentional weight loss 03/19/2022    Assessment: Patient Reported Symptoms:  Cognitive Cognitive Status: Alert and oriented to person, place, and time, Able to follow simple commands, Normal speech and language skills Cognitive/Intellectual Conditions Management [RPT]: None reported or documented in medical history or problem list   Health Facilitated by: Healthy diet, Prayer/meditation  Neurological Neurological Review of Symptoms: No symptoms reported Neurological Management Strategies: Routine screening  HEENT HEENT Symptoms Reported: Other: Other HEENT Symptoms/Conditions: ear wax      Cardiovascular Cardiovascular Symptoms Reported: No symptoms reported Does patient have uncontrolled Hypertension?: Yes Is patient checking Blood Pressure at home?: Yes Cardiovascular Conditions: High blood cholesterol, Hypertension Cardiovascular Management Strategies: Medication therapy  Respiratory Respiratory Symptoms Reported: No symptoms  reported Respiratory Conditions: COPD  Endocrine Patient reports the following symptoms related to hypoglycemia or hyperglycemia : No symptoms reported Is patient diabetic?: No    Gastrointestinal Gastrointestinal Symptoms Reported: Flatulence Additional Gastrointestinal Details: gas   Nutrition Risk Screen (CP): Reduced oral intake over the last month  Genitourinary Genitourinary Symptoms Reported: Incontinence Genitourinary Conditions: Incontinence Genitourinary Management Strategies: Incontinence garment/pad  Integumentary Integumentary Symptoms Reported: No symptoms reported    Musculoskeletal Musculoskelatal Symptoms Reviewed: Weakness Musculoskeletal Conditions: Joint pain Musculoskeletal Management Strategies: Adequate rest, Exercise Falls in the past year?: Yes Number of falls in past year: 1 or less Was there an injury with Fall?: Yes Fall Risk Category Calculator: 2 Patient Fall Risk Level: Moderate Fall Risk Patient at Risk for Falls Due to: History of fall(s), Impaired balance/gait  Psychosocial       Quality of Family Relationships: helpful, involved, supportive Do you feel physically threatened by others?: No      11/04/2023    9:46 AM  Depression screen PHQ 2/9  Decreased Interest 2  Down, Depressed, Hopeless 2  PHQ - 2 Score 4  Altered sleeping 0  Tired, decreased energy 3  Change in appetite 3  Feeling bad or failure about yourself  3  Trouble concentrating 2  Moving slowly or fidgety/restless 0  Suicidal thoughts 1  PHQ-9 Score 16  Difficult doing work/chores Very difficult    There were no vitals filed for this visit.  Medications Reviewed Today     Reviewed by Fletcher Humble, LCSW (Social Worker) on 11/04/23 at 1708  Med List Status: <None>   Medication Order Taking? Sig Documenting Provider Last Dose Status Informant  acetaminophen  (TYLENOL ) 500 MG tablet [id number] No Take 2 tablets (1,000 mg total) by mouth 3 (  three) times daily. Roise Cleaver, MD Taking Active Self, Pharmacy Records  albuterol  (VENTOLIN  HFA) [phone] Base) MCG/ACT inhaler [id number] No INHALE 2 PUFFS INTO THE LUNGS EVERY 6 HOURS AS NEEDED FOR WHEEZE OR SHORTNESS OF Karrie Paddy, MD Taking Active   alendronate  (FOSAMAX ) 70 MG tablet [id number] No Take 1 tablet (70 mg total) by mouth every 7 (seven) days. Take with a full glass of water  on an empty stomach. Roise Cleaver, MD Taking Active Self, Pharmacy Records  amLODipine -olmesartan  (AZOR ) 10-40 MG tablet [id number] No Take 1 tablet by mouth daily. For blood pressure. Roise Cleaver, MD Taking Active   arformoterol  (BROVANA ) 15 MCG/2ML NEBU [id number] No Take 2 mLs (15 mcg total) by nebulization 2 (two) times daily. Hunsucker, Archer Kobs, MD Taking Active Self, Pharmacy Records  atorvastatin  (LIPITOR ) 80 MG tablet [id number] No Take 1 tablet (80 mg total) by mouth daily.  Patient taking differently: Take 80 mg by mouth at bedtime.   Clearnce Curia, NP Taking Active Self, Pharmacy Records  budesonide  (PULMICORT ) 0.5 MG/2ML nebulizer solution [id number] No Take 2 mLs (0.5 mg total) by nebulization 2 (two) times daily. Hunsucker, Archer Kobs, MD Taking Active Self, Pharmacy Records  budesonide -formoterol  (SYMBICORT ) 160-4.5 MCG/ACT inhaler [id number]  INHALE 2 PUFFS INTO THE LUNGS TWICE A DAY Stacks, Warren, MD  Active   cloNIDine  (CATAPRES ) 0.2 MG tablet [id number] No Take 1.5 tablets morning and evening, and 1 tablet mid-day. Maudine Sos, MD Taking Active   clopidogrel  (PLAVIX ) 75 MG tablet [id number] No Take 1 tablet (75 mg total) by mouth daily. Clearnce Curia, NP Taking Active Self, Pharmacy Records  colchicine  0.6 MG tablet [id number] No Take 1 tablet (0.6 mg total) by mouth daily. Tolia, Sunit, DO Taking Expired 10/14/23 2359   furosemide  (LASIX ) 20 MG tablet [id number] No Take 1 tablet (20 mg total) by mouth daily. For swelling Clearnce Curia, NP Taking Active Self, Pharmacy Records  hydrALAZINE   (APRESOLINE ) 100 MG tablet [id number] No Take 1 tablet (100 mg total) by mouth 3 (three) times daily.  Patient not taking: Reported on 11/04/2023   Clearnce Curia, NP Not Taking Active            Med Note Koleen Perna, [facility]   Mon Nov 04, 2023  9:26 AM) Patient states "easing self off medicines". Not sure of last dose.   isosorbide  mononitrate (IMDUR ) 60 MG 24 hr tablet [id number] No Take 60 mg by mouth daily. Patient states prescribed daily for chest pain [provider] Taking Active Self  Magnesium  250 MG TABS [id number] No Take 1 tablet (250 mg total) by mouth daily. Roise Cleaver, MD Taking Active Self, Pharmacy Records           Med Note Koleen Perna, [facility]   Mon Nov 04, 2023  9:27 AM) Per patient, takes q 2-3 days  pantoprazole  (PROTONIX ) 40 MG tablet [id number] No Take 1 tablet (40 mg total) by mouth daily. Tolia, Sunit, DO Taking Expired 08/14/23 2359   potassium chloride  SA (KLOR-CON  M) 20 MEQ tablet [id number] No Take 1 tablet (20 mEq total) by mouth daily. For potassium replacement/ supplement Roise Cleaver, MD Taking Active   predniSONE  (DELTASONE ) 20 MG tablet [id number] No One twice daily with food for 2 weeks. Then one daily for 2 weeks  Patient not taking: Reported on 11/04/2023   Roise Cleaver, MD Not Taking Active   pregabalin  (LYRICA ) 300 MG capsule [id number] No Take 1 capsule (300 mg total) by mouth at bedtime. To reduce  back pain  Patient not taking: Reported on 11/04/2023   Roise Cleaver, MD Not Taking Active   QUEtiapine  (SEROQUEL ) 25 MG tablet [id number] No Take 1 tablet (25 mg total) by mouth at bedtime. Roise Cleaver, MD Taking Active   rOPINIRole  (REQUIP ) 1 MG tablet [id number] No TAKE 1 TABLET (1 MG TOTAL) BY MOUTH AT BEDTIME. FOR LEG CRAMPS  Patient taking differently: Take 1 mg by mouth as needed. For leg cramps   Roise Cleaver, MD Taking Active   SUMAtriptan  (IMITREX ) 100 MG tablet [id number] No Take one at onset of HA. May repeat in 2 hours if headache persists or  recurs. Limit two per 24 hours  Patient not taking: Reported on 11/04/2023   Roise Cleaver, MD Not Taking Active Self, Pharmacy Records           Med Note [facility] [place], [place] A   Wed Jul 10, 2023  6:22 PM) Prn   vitamin B-12 1000 MCG tablet [id number] No Take 1 tablet (1,000 mcg total) by mouth daily.  Patient not taking: Reported on 11/04/2023   Rayfield Cairo, MD Not Taking Active Self, Pharmacy Records  Vitamin D , Ergocalciferol , (DRISDOL ) 1.25 MG (50000 UNIT) CAPS capsule [id number] No Take 1 capsule (50,000 Units total) by mouth every 7 (seven) days.  Patient not taking: Reported on 11/04/2023   Roise Cleaver, MD Not Taking Active Self, Pharmacy Records           Med Note [facility] [place], [place] A   Wed Jul 10, 2023  6:14 PM)              Recommendation:   Referral to: [place] outpatient behavioral health   Follow Up Plan:   Telephone follow up appointment date/time:  11/18/2023 11am  Fletcher Humble MSW, LCSW Licensed Clinical Social Worker  [facility] [place], Population Health Direct Dial: [phone]  Fax: [phone]

## 2023-11-05 ENCOUNTER — Telehealth: Admitting: Licensed Clinical Social Worker

## 2023-11-07 ENCOUNTER — Encounter: Payer: Self-pay | Admitting: Family Medicine

## 2023-11-07 ENCOUNTER — Ambulatory Visit (INDEPENDENT_AMBULATORY_CARE_PROVIDER_SITE_OTHER): Admitting: Family Medicine

## 2023-11-07 VITALS — BP 137/72 | HR 84 | Temp 97.6°F | Ht 65.75 in | Wt 124.0 lb

## 2023-11-07 DIAGNOSIS — M5416 Radiculopathy, lumbar region: Secondary | ICD-10-CM

## 2023-11-07 DIAGNOSIS — I10 Essential (primary) hypertension: Secondary | ICD-10-CM | POA: Diagnosis not present

## 2023-11-07 DIAGNOSIS — M2550 Pain in unspecified joint: Secondary | ICD-10-CM

## 2023-11-07 NOTE — Patient Instructions (Signed)
 Resume the pregabalin  300 mg at bedtime.  Stop taking the quetiapine 

## 2023-11-07 NOTE — Progress Notes (Signed)
 Subjective:  Patient ID: [name], female    DOB: [birth year]  Age: 83 y.o. MRN: [id number]  CC: Medical Management of Chronic Issues and Edema (Left foot swollen as of this morning. )   HPI [name] presents for continued excruciating pain. 10/10. Goes from L5 to aterior thighs.  She is concerned about the possibility of rheumatoid arthritis.  Apparently a provider at some point told her that that was a possibility.   Follow-up of hypertension. Patient has no history of headache chest pain or shortness of breath or recent cough. Patient also denies symptoms of TIA such as numbness weakness lateralizing. Patient checks  blood pressure at home and has not had any elevated readings recently. Patient denies side effects from his medication. States taking it regularly.  She is not followed by resistant hypertension clinic due to the difficulty bringing her pressure down recently.  She is tolerating a wide variety of medicines closely managed by the clinic.  Among the medications are clonidine  and hydralazine .  She stated she was not taking them to the nurse.  However when I spoke with her she says that she is taking them.  She is very proud that her blood pressure is under better control.      11/07/2023   10:55 AM 11/04/2023    9:46 AM 10/10/2023    9:00 AM  Depression screen PHQ 2/9  Decreased Interest 3 2 2   Down, Depressed, Hopeless 3 2 2   PHQ - 2 Score 6 4 4   Altered sleeping 3 0 2  Tired, decreased energy 0 3 3  Change in appetite 0 3 3  Feeling bad or failure about yourself  3 3 2   Trouble concentrating 3 2 2   Moving slowly or fidgety/restless 3 0 2  Suicidal thoughts 2 1 0  PHQ-9 Score 20 16 18   Difficult doing work/chores Extremely dIfficult Very difficult Very difficult    History [name] has a past medical history of Acute idiopathic pericarditis (07/15/2023), Agatston coronary artery calcium  score between 200 and 399 (07/15/2023), Arthritis, Atypical chest pain  (03/19/2022), Back pain with sciatica (02/21/2021), CHF (congestive heart failure) (HCC), GERD (gastroesophageal reflux disease), Headache(784.0), Hepatitis, History of gout, Hypertension, Hypertensive encephalopathy, 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   She has a past surgical history that includes Abdominal hysterectomy; Anterior cervical corpectomy (03/07/2012); Tonsillectomy; Multiple tooth extractions; Cataract extraction w/PHACO (Left, 04/12/2017); Cataract extraction w/PHACO (Right, 05/03/2017); and LEFT HEART CATH AND CORONARY ANGIOGRAPHY (N/A, 07/11/2023).   Her family history includes Asthma in her daughter; Bipolar disorder in her daughter, daughter, daughter, daughter, and son; Drug abuse in her daughter; Heart disease in her daughter and daughter; Hypertension in her daughter. She was adopted.She reports that she quit smoking about 19 months ago. Her smoking use included cigarettes. She started smoking about 63 years ago. She has a 30.9 pack-year smoking history. She has been exposed to tobacco smoke. She has never used smokeless tobacco. She reports that she does not currently use alcohol. She reports that she does not use drugs.    ROS Review of Systems  Constitutional: Negative.   HENT:  Negative for congestion.   Eyes:  Negative for visual disturbance.  Respiratory:  Negative for shortness of breath.   Cardiovascular:  Negative for chest pain.  Gastrointestinal:  Negative for abdominal pain, constipation, diarrhea, nausea and vomiting.  Genitourinary:  Negative  for difficulty urinating.  Musculoskeletal:  Positive for arthralgias, back pain and myalgias.  Neurological:  Negative for headaches.  Psychiatric/Behavioral:  Negative for sleep disturbance.     Objective:  BP 137/72    Pulse 84   Temp 97.6 F (36.4 C)   Ht 5' 5.75 (1.67 m)   Wt 124 lb (56.2 kg)   SpO2 96%   BMI 20.17 kg/m   BP Readings from Last 3 Encounters:  11/07/23 137/72  11/04/23 130/73  10/29/23 (!) 142/80    Wt Readings from Last 3 Encounters:  11/07/23 124 lb (56.2 kg)  10/29/23 135 lb 8 oz (61.5 kg)  10/10/23 124 lb (56.2 kg)     Physical Exam Constitutional:      General: She is not in acute distress.    Appearance: She is well-developed.   Cardiovascular:     Rate and Rhythm: Normal rate and regular rhythm.  Pulmonary:     Breath sounds: Normal breath sounds.   Musculoskeletal:        General: Normal range of motion.   Skin:    General: Skin is warm and dry.   Neurological:     Mental Status: She is alert and oriented to person, place, and time.      Assessment & Plan:  Arthralgia, unspecified joint -     Arthritis Panel -     Sedimentation rate  Lumbar radiculopathy  Essential hypertension with goal blood pressure less than 130/80     Follow-up: Return in about 1 month (around 12/07/2023).  Roise Cleaver, M.D.

## 2023-11-08 ENCOUNTER — Encounter: Payer: Self-pay | Admitting: Family Medicine

## 2023-11-08 LAB — ARTHRITIS PANEL
Basophils Absolute: 0 10*3/uL (ref 0.0–0.2)
Basos: 1 %
EOS (ABSOLUTE): 0.1 10*3/uL (ref 0.0–0.4)
Eos: 3 %
Hematocrit: 50.4 % — ABNORMAL HIGH (ref 34.0–46.6)
Hemoglobin: 15.5 g/dL (ref 11.1–15.9)
Immature Grans (Abs): 0 10*3/uL (ref 0.0–0.1)
Immature Granulocytes: 0 %
Lymphocytes Absolute: 1.6 10*3/uL (ref 0.7–3.1)
Lymphs: 36 %
MCH: 29.4 pg (ref 26.6–33.0)
MCHC: 30.8 g/dL — ABNORMAL LOW (ref 31.5–35.7)
MCV: 96 fL (ref 79–97)
Monocytes Absolute: 0.4 10*3/uL (ref 0.1–0.9)
Monocytes: 8 %
Neutrophils Absolute: 2.2 10*3/uL (ref 1.4–7.0)
Neutrophils: 52 %
Platelets: 141 10*3/uL — ABNORMAL LOW (ref 150–450)
RBC: 5.28 x10E6/uL (ref 3.77–5.28)
RDW: 13.1 % (ref 11.7–15.4)
Rheumatoid fact SerPl-aCnc: 11.3 [IU]/mL (ref <14.0)
Sed Rate: 39 mm/h (ref 0–40)
Uric Acid: 7.3 mg/dL (ref 3.1–7.9)
WBC: 4.4 10*3/uL (ref 3.4–10.8)

## 2023-11-12 ENCOUNTER — Ambulatory Visit: Payer: Self-pay | Admitting: Family Medicine

## 2023-11-12 NOTE — Progress Notes (Signed)
 Hello [name],  Your lab result is normal and/or stable.Some minor variations that are not significant are commonly marked abnormal, but do not represent any medical problem for you.  Best regards, Mechele Claude, M.D.

## 2023-11-14 ENCOUNTER — Ambulatory Visit

## 2023-11-14 VITALS — BP 135/65 | HR 86 | Ht 65.0 in | Wt 124.0 lb

## 2023-11-14 DIAGNOSIS — Z0000 Encounter for general adult medical examination without abnormal findings: Secondary | ICD-10-CM

## 2023-11-14 DIAGNOSIS — Z9181 History of falling: Secondary | ICD-10-CM | POA: Diagnosis not present

## 2023-11-14 NOTE — Patient Instructions (Signed)
 [name] , Thank you for taking time out of your busy schedule to complete your Annual Wellness Visit with me. I enjoyed our conversation and look forward to speaking with you again next year. I, as well as your care team,  appreciate your ongoing commitment to your health goals. Please review the following plan we discussed and let me know if I can assist you in the future. Your Game plan/ To Do List    Follow up Visits: Next Medicare AWV with our clinical staff: 11/16/24 at 10:00a.m.   Next Office Visit with your provider: 12/09/23 at 11:25a.m  Clinician Recommendations:  Aim for 30 minutes of exercise or brisk walking, 6-8 glasses of water , and 5 servings of fruits and vegetables each day.       This is a list of the screening recommended for you and due dates:  Health Maintenance  Topic Date Due   COVID-19 Vaccine (7 - 2024-25 season) 10/07/2023   Flu Shot  12/27/2023   Medicare Annual Wellness Visit  11/13/2024   DTaP/Tdap/Td vaccine (2 - Td or Tdap) 11/23/2024   DEXA scan (bone density measurement)  01/23/2025   Pneumococcal Vaccine for age over 5  Completed   Zoster (Shingles) Vaccine  Completed   HPV Vaccine  Aged Out   Meningitis B Vaccine  Aged Out   Screening for Lung Cancer  Discontinued   Hepatitis C Screening  Discontinued    Advanced directives: (In Chart) A copy of your advanced directives are scanned into your chart should your provider ever need it. Advance Care Planning is important because it:  [x]  Makes sure you receive the medical care that is consistent with your values, goals, and preferences  [x]  It provides guidance to your family and loved ones and reduces their decisional burden about whether or not they are making the right decisions based on your wishes.  Follow the link provided in your after visit summary or read over the paperwork we have mailed to you to help you started getting your Advance Directives in place. If you need assistance in completing  these, please reach out to us  so that we can help you!  See attachments for Preventive Care and Fall Prevention Tips.

## 2023-11-14 NOTE — Progress Notes (Signed)
 Subjective:   [name] is a 83 y.o. who presents for a Medicare Wellness preventive visit.  As a reminder, Annual Wellness Visits don't include a physical exam, and some assessments may be limited, especially if this visit is performed virtually. We may recommend an in-person follow-up visit with your provider if needed.  Visit Complete: Virtual I connected with  [name] on 11/14/23 by a audio enabled telemedicine application and verified that I am speaking with the correct person using two identifiers.  Patient Location: Home  Provider Location: Home Office  I discussed the limitations of evaluation and management by telemedicine. The patient expressed understanding and agreed to proceed.  Vital Signs: Because this visit was a virtual/telehealth visit, some criteria may be missing or patient reported. Any vitals not documented were not able to be obtained and vitals that have been documented are patient reported.  VideoDeclined- This patient declined Librarian, academic. Therefore the visit was completed with audio only.  Persons Participating in Visit: Patient.  AWV Questionnaire: No: Patient Medicare AWV questionnaire was not completed prior to this visit.  Cardiac Risk Factors include: advanced age (>60men, >71 women);dyslipidemia;hypertension     Objective:    Today's Vitals   11/14/23 1632  BP: 135/65  Pulse: 86  Weight: 124 lb (56.2 kg)  Height: 5' 5 (1.651 m)   Body mass index is 20.63 kg/m.     11/14/2023    2:22 PM 11/04/2023   10:08 AM 10/07/2023    9:45 AM 09/06/2023    9:37 AM 07/15/2023   10:23 AM 07/10/2023    5:59 PM 05/01/2023   10:33 AM  Advanced Directives  Does Patient Have a Medical Advance Directive? Yes Yes Yes Yes No No No  Type of Advance Directive Living will Living will Healthcare Power of [place];Living will Healthcare Power of Attorney     Does patient want to make changes to medical advance  directive?  No - Patient declined No - Patient declined No - Patient declined     Copy of Healthcare Power of Attorney in Chart? No - copy requested Yes - validated most recent copy scanned in chart (See row information) Yes - validated most recent copy scanned in chart (See row information) No - copy requested     Would patient like information on creating a medical advance directive?     No - Patient declined Yes (Inpatient - patient requests chaplain consult to create a medical advance directive) No - Patient declined    Current Medications (verified) Outpatient Encounter Medications as of 11/14/2023  Medication Sig   acetaminophen  (TYLENOL ) 500 MG tablet Take 2 tablets (1,000 mg total) by mouth 3 (three) times daily.   albuterol  (VENTOLIN  HFA) 108 (90 Base) MCG/ACT inhaler INHALE 2 PUFFS INTO THE LUNGS EVERY 6 HOURS AS NEEDED FOR WHEEZE OR SHORTNESS OF BREATH   alendronate  (FOSAMAX ) 70 MG tablet Take 1 tablet (70 mg total) by mouth every 7 (seven) days. Take with a full glass of water  on an empty stomach.   amLODipine -olmesartan  (AZOR ) 10-40 MG tablet Take 1 tablet by mouth daily. For blood pressure.   arformoterol  (BROVANA ) 15 MCG/2ML NEBU Take 2 mLs (15 mcg total) by nebulization 2 (two) times daily.   atorvastatin  (LIPITOR ) 80 MG tablet Take 1 tablet (80 mg total) by mouth daily.   budesonide  (PULMICORT ) 0.5 MG/2ML nebulizer solution Take 2 mLs (0.5 mg total) by nebulization 2 (two) times daily.   budesonide -formoterol  (SYMBICORT ) 160-4.5 MCG/ACT  inhaler INHALE 2 PUFFS INTO THE LUNGS TWICE A DAY   cloNIDine  (CATAPRES ) 0.2 MG tablet Take 1.5 tablets morning and evening, and 1 tablet mid-day.   clopidogrel  (PLAVIX ) 75 MG tablet Take 1 tablet (75 mg total) by mouth daily.   furosemide  (LASIX ) 20 MG tablet Take 1 tablet (20 mg total) by mouth daily. For swelling   hydrALAZINE  (APRESOLINE ) 100 MG tablet Take 1 tablet (100 mg total) by mouth 3 (three) times daily.   isosorbide  mononitrate (IMDUR )  60 MG 24 hr tablet Take 60 mg by mouth daily. Patient states prescribed daily for chest pain   Magnesium  250 MG TABS Take 1 tablet (250 mg total) by mouth daily.   pantoprazole  (PROTONIX ) 40 MG tablet Take 1 tablet (40 mg total) by mouth daily.   potassium chloride  SA (KLOR-CON  M) 20 MEQ tablet Take 1 tablet (20 mEq total) by mouth daily. For potassium replacement/ supplement   vitamin B-12 1000 MCG tablet Take 1 tablet (1,000 mcg total) by mouth daily.   Vitamin D , Ergocalciferol , (DRISDOL ) 1.25 MG (50000 UNIT) CAPS capsule Take 1 capsule (50,000 Units total) by mouth every 7 (seven) days.   colchicine  0.6 MG tablet Take 1 tablet (0.6 mg total) by mouth daily. (Patient not taking: Reported on 11/14/2023)   pregabalin  (LYRICA ) 300 MG capsule Take 1 capsule (300 mg total) by mouth at bedtime. To reduce back pain (Patient not taking: Reported on 11/14/2023)   rOPINIRole  (REQUIP ) 1 MG tablet TAKE 1 TABLET (1 MG TOTAL) BY MOUTH AT BEDTIME. FOR LEG CRAMPS (Patient not taking: Reported on 11/14/2023)   SUMAtriptan  (IMITREX ) 100 MG tablet Take one at onset of HA. May repeat in 2 hours if headache persists or recurs. Limit two per 24 hours (Patient not taking: Reported on 11/14/2023)   No facility-administered encounter medications on file as of 11/14/2023.    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   Ileus (HCC) 05/15/2021   Influenza A 07/15/2023   NSTEMI (non-ST  elevated myocardial infarction) (HCC) 07/10/2023   Stroke (HCC)    SLIGHT RT SIDE WEAKNESS 2001   Unintentional weight loss 03/19/2022   Past Surgical History:  Procedure Laterality Date   ABDOMINAL HYSTERECTOMY     ANTERIOR CERVICAL CORPECTOMY  03/07/2012   Procedure: ANTERIOR CERVICAL CORPECTOMY;  Surgeon: Elna Haggis, MD;  Location: [facility];  Service: Neurosurgery;  Laterality: N/A;  Cervical six-seven, cervical seven-thoracic one Anterior cervical decompression/diskectomy/fusion, with Cervical seven Corpectomy, reconstruction using Allograft and Alphatec plate   CATARACT EXTRACTION W/PHACO Left 04/12/2017   Procedure: CATARACT EXTRACTION PHACO AND INTRAOCULAR LENS PLACEMENT (IOC);  Surgeon: Tarri Farm, MD;  Location: [facility];  Service: Ophthalmology;  Laterality: Left;  CDE: 3.82   CATARACT EXTRACTION W/PHACO Right 05/03/2017   Procedure: CATARACT EXTRACTION PHACO AND INTRAOCULAR LENS PLACEMENT RIGHT EYE;  Surgeon: Tarri Farm, MD;  Location: [facility];  Service: Ophthalmology;  Laterality: Right;  CDE:  3.89   LEFT HEART CATH AND CORONARY ANGIOGRAPHY N/A 07/11/2023   Procedure: LEFT HEART CATH AND CORONARY ANGIOGRAPHY;  Surgeon: Cody Das, MD;  Location: [facility];  Service: Cardiovascular;  Laterality: N/A;   MULTIPLE TOOTH EXTRACTIONS     TONSILLECTOMY     Family History  Adopted: Yes  Problem Relation Age of Onset   Bipolar disorder Daughter    Heart disease Daughter    Asthma Daughter    Bipolar disorder Son    Bipolar disorder Daughter    Bipolar disorder Daughter    Bipolar disorder Daughter    Hypertension Daughter    Heart disease Daughter    Drug abuse Daughter        OD   Social History   Socioeconomic History   Marital status: Widowed    Spouse name: Not on file   Number of children: 5   Years of education: 52   Highest education level: 12th grade  Occupational History   Occupation: retired    Comment: CNA  Tobacco Use   Smoking  status: Former    Current packs/day: 0.00    Average packs/day: 0.5 packs/day for 61.8 years (30.9 ttl pk-yrs)    Types: Cigarettes    Start date: 4    Quit date: 03/24/2022    Years since quitting: 1.6    Passive exposure: Past   Smokeless tobacco: Never  Vaping Use   Vaping status: Never Used  Substance and Sexual Activity   Alcohol use: Not Currently   Drug use: No   Sexual activity: Not Currently    Partners: Male    Birth control/protection: Surgical  Other Topics Concern   Not on file  Social History Narrative   5 children living, 1 deceased.    Children live out of state.   Social Drivers of Corporate investment banker Strain: Low Risk  (11/14/2023)   Overall Financial Resource Strain (CARDIA)    Difficulty of Paying Living Expenses: Not hard at all  Food Insecurity: No Food Insecurity (11/14/2023)   Hunger Vital Sign    Worried About Running Out of Food in the Last Year: Never true    Ran Out of Food in the Last Year: Never true  Transportation Needs: No Transportation Needs (11/14/2023)   PRAPARE - Administrator, Civil Service (Medical): No    Lack of Transportation (Non-Medical): No  Physical Activity: Insufficiently Active (11/14/2023)   Exercise Vital Sign    Days of Exercise per Week: 7 days    Minutes of Exercise per Session: 10 min  Stress: Stress Concern Present (11/14/2023)   [employer] of Occupational Health - Occupational Stress Questionnaire    Feeling of Stress: Rather much  Social Connections: Socially Integrated (11/14/2023)   Social Connection and Isolation Panel    Frequency of Communication with Friends and Family: More than three times a week    Frequency of Social Gatherings with Friends and Family: More than three times a week    Attends Religious Services: More than 4 times per year    Active Member of [employer] or Organizations: Yes    Attends Engineer, structural: More than 4 times per year    Marital Status:  Living with partner    Tobacco Counseling Counseling given: Yes    Clinical Intake:  Pre-visit preparation completed: Yes  Pain : No/denies pain     BMI - recorded: 20.63 Nutritional Status: BMI 25 -29 Overweight Nutritional  Risks: None Diabetes: No  Lab Results  Component Value Date   HGBA1C 5.7 (H) 02/12/2012     How often do you need to have someone help you when you read instructions, pamphlets, or other written materials from your doctor or pharmacy?: 1 - Never  Interpreter Needed?: No  Information entered by :: Alia t/cma   Activities of Daily Living     11/14/2023    2:08 PM 07/15/2023   10:25 AM  In your present state of health, do you have any difficulty performing the following activities:  Hearing? 1 0  Vision? 1 0  Difficulty concentrating or making decisions? 0 0  Walking or climbing stairs? 1 1  Comment  Unsteady Balance/Gait; History of Falls.  Dressing or bathing? 0 1  Comment  Unsteady Balance/Gait; History of Falls.  Doing errands, shopping? 1 1  Comment pt takes van/public transportation Unsteady Balance/Gait; History of Falls.  Preparing Food and eating ? N N  Using the Toilet? N N  In the past six months, have you accidently leaked urine? Y N  Do you have problems with loss of bowel control? N N  Managing your Medications? N N  Managing your Finances? N N  Housekeeping or managing your Housekeeping? N Y  Comment  Unsteady Balance/Gait; History of Falls.    Patient Care Team: Roise Cleaver, MD as PCP - General (Family Medicine) Maudine Sos, MD as PCP - Cardiology (Cardiology) Remona Carmel, RN as [facility] Care Management (General Practice) Remona Carmel, RN  I have updated your Care Teams any recent Medical Services you may have received from other providers in the past year.     Assessment:   This is a routine wellness examination for [name].  Hearing/Vision screen Hearing Screening - Comments:: Pt denies hearing  dif Vision Screening - Comments:: Pt wear glasses/pt goes to [facility] [address] in [place], [place]/ upcoming appt in 10/25   Goals Addressed             This Visit's Progress    Patient Stated       Pt wants  walking more       Depression Screen     11/14/2023    2:27 PM 11/07/2023   10:55 AM 11/04/2023    9:46 AM 10/10/2023    9:00 AM 10/07/2023    9:24 AM 09/06/2023    9:29 AM 09/05/2023    9:10 AM  PHQ 2/9 Scores  PHQ - 2 Score 6 6 4 4 1 1 1   PHQ- 9 Score 15 20 16 18   7     Fall Risk     11/14/2023    1:57 PM 11/04/2023    5:19 PM 11/04/2023    9:41 AM 10/07/2023    9:24 AM 09/06/2023    9:28 AM  Fall Risk   Falls in the past year? 1 1 1  0 1  Number falls in past yr: 1 0 1 0 1  Injury with Fall? 1 1 1  0 0  Risk for fall due to : Impaired balance/gait;Impaired mobility History of fall(s);Impaired balance/gait History of fall(s);Impaired balance/gait No Fall Risks History of fall(s)  Follow up Falls evaluation completed;Education provided;Falls prevention discussed  Falls evaluation completed Falls evaluation completed Falls evaluation completed;Education provided;Falls prevention discussed    MEDICARE RISK AT HOME:  Medicare Risk at Home Any stairs in or around the home?: No If so, are there any without handrails?: No Home free of loose throw rugs in walkways,  pet beds, electrical cords, etc?: Yes Adequate lighting in your home to reduce risk of falls?: Yes Life alert?: No Use of a cane, walker or w/c?: Yes Grab bars in the bathroom?: No Shower chair or bench in shower?: No Elevated toilet seat or a handicapped toilet?: No  TIMED UP AND GO:  Was the test performed?  no  Cognitive Function: 6CIT completed        11/14/2023    2:30 PM 11/07/2022    8:54 AM 01/27/2021    2:56 PM 01/27/2020    8:51 AM 01/26/2019    8:45 AM  6CIT Screen  What Year? 0 points 0 points 0 points 0 points 0 points  What month? 0 points 0 points 0 points 0 points 0 points  What time? 0 points 0 points 0  points 0 points 0 points  Count back from 20 0 points 0 points 0 points 0 points 0 points  Months in reverse 4 points 0 points 4 points 0 points 2 points  Repeat phrase 0 points 0 points 0 points 0 points 0 points  Total Score 4 points 0 points 4 points 0 points 2 points    Immunizations Immunization History  Administered Date(s) Administered   Fluad Quad(high Dose 65+) 02/26/2016, 02/25/2017, 02/26/2018, 02/24/2020, 02/21/2021   Fluad Trivalent(High Dose 65+) 04/09/2023   Influenza Split 02/13/2012   Influenza, High Dose Seasonal PF 02/19/2019   Influenza-Unspecified 03/28/2013   Moderna Covid-19 Fall Seasonal Vaccine 30yrs & older 04/09/2023   Moderna Covid-19 Vaccine Bivalent Booster 93yrs & up 07/18/2021   Moderna Sars-Covid-2 Vaccination 07/23/2019, 08/21/2019, 03/23/2020   Pfizer(Comirnaty)Fall Seasonal Vaccine 12 years and older 03/06/2022   Pneumococcal Conjugate-13 11/24/2014   Pneumococcal Polysaccharide-23 07/09/2016   Pneumococcal-Unspecified 07/09/2016   Tdap 11/24/2014   Zoster Recombinant(Shingrix ) 12/27/2021, 06/20/2022    Screening Tests Health Maintenance  Topic Date Due   COVID-19 Vaccine (7 - 2024-25 season) 10/07/2023   INFLUENZA VACCINE  12/27/2023   Medicare Annual Wellness (AWV)  11/13/2024   DTaP/Tdap/Td (2 - Td or Tdap) 11/23/2024   DEXA SCAN  01/23/2025   Pneumococcal Vaccine: 50+ Years  Completed   Zoster Vaccines- Shingrix   Completed   HPV VACCINES  Aged Out   Meningococcal B Vaccine  Aged Out   Lung Cancer Screening  Discontinued   Hepatitis C Screening  Discontinued    Health Maintenance  Health Maintenance Due  Topic Date Due   COVID-19 Vaccine (7 - 2024-25 season) 10/07/2023   Health Maintenance Items Addressed: See Nurse Notes at the end of this note  Additional Screening:  Vision Screening: Recommended annual ophthalmology exams for early detection of glaucoma and other disorders of the eye. Would you like a referral to an eye  doctor? No    Dental Screening: Recommended annual dental exams for proper oral hygiene  Community Resource Referral / Chronic Care Management: CRR required this visit?  No   CCM required this visit?  No   Plan:    I have personally reviewed and noted the following in the patient's chart:   Medical and social history Use of alcohol, tobacco or illicit drugs  Current medications and supplements including opioid prescriptions. Patient is not currently taking opioid prescriptions. Functional ability and status Nutritional status Physical activity Advanced directives List of other physicians Hospitalizations, surgeries, and ER visits in previous 12 months Vitals Screenings to include cognitive, depression, and falls Referrals and appointments  In addition, I have reviewed and discussed with patient certain preventive protocols,  quality metrics, and best practice recommendations. A written personalized care plan for preventive services as well as general preventive health recommendations were provided to patient.   Michaelle Adolphus, CMA   11/14/2023   After Visit Summary: (Declined) Due to this being a telephonic visit, with patients personalized plan was offered to patient but patient Declined AVS at this time   Notes: Please refer to Routing Comments.

## 2023-11-18 ENCOUNTER — Other Ambulatory Visit: Payer: Self-pay | Admitting: Licensed Clinical Social Worker

## 2023-11-19 NOTE — Patient Instructions (Signed)
 Visit Information  Thank you for taking time to visit with me today. Please don't hesitate to contact me if I can be of assistance to you before our next scheduled appointment.  Your next care management appointment is by telephone on 12/02/2023 at 9am  Telephone follow up appointment date/time:  12/02/2023 with RNCM  Please call the care guide team at [phone] if you need to cancel, schedule, or reschedule an appointment.   Please call 911 if you are experiencing a Mental Health or Behavioral Health Crisis or need someone to talk to.  Alfonso Rummer MSW, LCSW Licensed Clinical Social Worker  [facility], Population Health Direct Dial: [phone]  Fax: [phone]

## 2023-11-19 NOTE — Patient Outreach (Signed)
 Complex Care Management   Visit Note  11/19/2023  Name:  [name] MRN: [id number] DOB: Oct 24, 1940  Situation: Referral received for Complex Care Management related to SDOH Barriers:  Housing safety I obtained verbal consent from Patient.  Visit completed with CHRISTELLA Carrel  on the phone. Pt request community resource information for the installation of grab bars in her bathroom. Pt provided with low cost and self installation options for bathroom grab bars. [name] reports no further social work concerns or questions.  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   Ileus (HCC) 05/15/2021   Influenza A 07/15/2023   NSTEMI (non-ST elevated myocardial infarction) (HCC) 07/10/2023   Stroke (HCC)    SLIGHT RT SIDE WEAKNESS 2001   Unintentional weight loss 03/19/2022    Assessment: Patient Reported Symptoms:  Cognitive Cognitive Status: Able to follow simple commands, Normal speech and language skills Cognitive/Intellectual Conditions Management [RPT]: None reported or documented in medical history or problem list      Neurological Neurological Review of Symptoms: No symptoms reported    HEENT HEENT Symptoms Reported: No symptoms reported      Cardiovascular Cardiovascular Symptoms Reported: No symptoms reported Does patient have uncontrolled Hypertension?: Yes Cardiovascular Conditions: High blood cholesterol, Hypertension Cardiovascular Management Strategies: Medication  therapy  Respiratory Respiratory Symptoms Reported: No symptoms reported Respiratory Conditions: COPD  Endocrine Patient reports the following symptoms related to hypoglycemia or hyperglycemia : No symptoms reported Is patient diabetic?: No Endocrine Management Strategies: Routine screening  Gastrointestinal Gastrointestinal Symptoms Reported: No symptoms reported      Genitourinary Genitourinary Symptoms Reported: Incontinence Genitourinary Conditions: Difficulty voiding Genitourinary Management Strategies: Incontinence garment/pad  Integumentary Integumentary Symptoms Reported: No symptoms reported    Musculoskeletal Musculoskelatal Symptoms Reviewed: Weakness Musculoskeletal Conditions: Joint pain Musculoskeletal Management Strategies: Adequate rest, Exercise      Psychosocial       Quality of Family Relationships: helpful Do you feel physically threatened by others?: No      11/14/2023    2:27 PM  Depression screen PHQ 2/9  Decreased Interest 3  Down, Depressed, Hopeless 3  PHQ - 2 Score 6  Altered sleeping 1  Tired, decreased energy 3  Change in appetite 1  Feeling bad or failure about yourself  1  Trouble concentrating 2  Moving slowly or fidgety/restless 0  Suicidal thoughts 1  PHQ-9 Score 15  Difficult doing work/chores Somewhat difficult    There were no vitals filed for this visit.  Medications Reviewed Today   Medications were not reviewed in this encounter     Recommendation:   Continue Current Plan of Care  Follow Up Plan:   Telephone follow up appointment date/time:  12/02/2023 with RNCM  Alfonso Rummer MSW, LCSW Licensed Clinical Social Worker  [facility], Population Health Direct Dial: [phone]  Fax: [phone]

## 2023-11-22 ENCOUNTER — Encounter (HOSPITAL_BASED_OUTPATIENT_CLINIC_OR_DEPARTMENT_OTHER): Payer: Self-pay | Admitting: Cardiovascular Disease

## 2023-11-22 ENCOUNTER — Ambulatory Visit (INDEPENDENT_AMBULATORY_CARE_PROVIDER_SITE_OTHER): Payer: No Typology Code available for payment source | Admitting: Cardiovascular Disease

## 2023-11-22 ENCOUNTER — Other Ambulatory Visit (HOSPITAL_COMMUNITY): Payer: Self-pay | Admitting: Family Medicine

## 2023-11-22 VITALS — BP 126/62 | HR 84 | Ht 61.75 in | Wt 119.3 lb

## 2023-11-22 DIAGNOSIS — Z8673 Personal history of transient ischemic attack (TIA), and cerebral infarction without residual deficits: Secondary | ICD-10-CM

## 2023-11-22 DIAGNOSIS — I700 Atherosclerosis of aorta: Secondary | ICD-10-CM

## 2023-11-22 DIAGNOSIS — N1831 Chronic kidney disease, stage 3a: Secondary | ICD-10-CM

## 2023-11-22 DIAGNOSIS — I719 Aortic aneurysm of unspecified site, without rupture: Secondary | ICD-10-CM

## 2023-11-22 DIAGNOSIS — E785 Hyperlipidemia, unspecified: Secondary | ICD-10-CM | POA: Diagnosis not present

## 2023-11-22 DIAGNOSIS — I10 Essential (primary) hypertension: Secondary | ICD-10-CM | POA: Diagnosis not present

## 2023-11-22 DIAGNOSIS — I701 Atherosclerosis of renal artery: Secondary | ICD-10-CM

## 2023-11-22 DIAGNOSIS — Z720 Tobacco use: Secondary | ICD-10-CM | POA: Diagnosis not present

## 2023-11-22 DIAGNOSIS — I739 Peripheral vascular disease, unspecified: Secondary | ICD-10-CM | POA: Diagnosis not present

## 2023-11-22 NOTE — Progress Notes (Signed)
 Cardiology Office Note:  .   Date:  11/22/2023  ID:  [name], DOB [birth year], MRN [id number] PCP: Zollie Lowers, MD  [facility] Providers Cardiologist:  Annabella Scarce, MD    History of Present Illness: Tabitha   VIVIAN Cisneros is a 83 y.o. female with a PMHx of CAD, pericarditis, HTN, HLD, COPD, prior tobacco abuse, penetrating aortic ulcer, stroke, and CKD here for follow up.  She was first seen for chest pain 02/2022.  She was seen in the ED on 03/12/22 with chest pain and shortness of breath that awoke her from sleep. High sensitivity troponin was negative x2 and EKG was unremarkable at that time. She was referred for outpatient cardiology follow up.  Her symptoms were atypical and food related.  She was referred for coronary CT-A that revealed moderate  non-obstructive disease.  Calcium  score 396 which was 85th percentile.  She was admitted 08/2022 with hypertensive urgency requiring a nicardipine  drip.  She was found to have focal renal artery stenosis on the L.  Dr. Gretta reccommended continued follow up.  Valsartan  and hydrochlorothiazide  were due to renal dysfunction.     At her visit 09/2022 Zetia  was added.  She was seen in the ED 04/2019 for blood pressures which subsequently.  At follow-up 04/2023 was noted that she was taking any of her cholesterol medicine (Zetia , Repatha , atorvastatin ).  She was seen at [facility] 06/2023 for flu-like symptoms.  Troponin was elevated 56-->366-->551.  BNP 6237.  She underwent LHC and had non-obstructive CAD.  ESR and CRP were elevated so she was treated with colchicine  and NSAIDS.  She saw Reche Finder, NP 07/2023 and hydrlazine 50mg  tid was added.  PCP added Seroquel  for anxiety.  She saw [facility] 07/2023 and BP averaged 149/82.  Hydralazine  was increased to 100mg  tid.  At her visit 09/2023 she noted itching due to her clonidine  patches.  She also noted swelling in her right foot.  Her blood pressure in the office was 240/109.  She had self  discontinued her clonidine .  She was switched to oral clonidine .   Discussed the use of AI scribe software for clinical note transcription with the patient, who gave verbal consent to proceed.  History of Present Illness Ms. Scarpati experiences chronic pain extending from her back down to her feet, significantly impacting her mobility. The pain is severe enough to necessitate the use of a cane for walking longer distances due to fear of sudden immobility. She has been under care for this pain, and blood tests were conducted, but she has not received any updates on the results.  She has a history of hypertension and is currently on a regimen including amlodipine , olmesartan , hydralazine , and clonidine . Her blood pressure has been stable and not alarming when checked at home.  She has a history of high cholesterol and is on atorvastatin , which was adjusted based on previous lab results. She mentions a recent dietary indulgence in duck, which she acknowledges might have affected her cholesterol levels.  She has a history of depression, particularly around her 83st birthday, but reports feeling better after passing that milestone. She describes a significant improvement in her mood, stating that the worries she had 'just lifted up all.'  She quit smoking about a year and a half to two years ago and does not consume alcohol. She used to walk three miles a day but reduced her activity due to medication side effects. She now walks a mile and a half daily,  using a cane for support due to her pain.  Medication regimen Changes Clondine patch on Fridays AM: Amlodipine -Olmesartan , Lasix , Hydralazine  Afternoon: Hydralazine  PM: Isosorbide , Hydralazine    Prior antihypertensives Metoprolol  - fatigue Bisoprolol  Hydrochlorothiazide  Lisinopril  - changed to ARB Clonidine  tablet - transitioned to patch Valsartan  Losartan   ROS:  As per HPI  Studies Reviewed: .       Echo 07/14/23:   1. Left ventricular  ejection fraction, by estimation, is 55 to 60%. The  left ventricle has normal function. The left ventricle has no regional  wall motion abnormalities. There is mild left ventricular hypertrophy.  Left ventricular diastolic parameters  are consistent with Grade I diastolic dysfunction (impaired relaxation).   2. Right ventricular systolic function is normal. The right ventricular  size is normal.   3. The mitral valve is normal in structure. No evidence of mitral valve  regurgitation. No evidence of mitral stenosis.   4. The aortic valve is tricuspid. Aortic valve regurgitation is not  visualized. No aortic stenosis is present.   5. The inferior vena cava is normal in size with greater than 50%  respiratory variability, suggesting right atrial pressure of 3 mmHg.   LHC 07/11/2023: Coronary angiography 07/11/2023: LM: Normal LAD: No significant disease          Diag 1 with mid 30% disease Lcx: No significant disease RCA: Large, dominant           Prox 50%, mid 40% disease   LVEDP 18 mmHg  Risk Assessment/Calculations:             Physical Exam:   VS:  BP 126/62   Pulse 84   Ht 5' 1.75 (1.568 m)   Wt 119 lb 4.8 oz (54.1 kg)   SpO2 91%   BMI 22.00 kg/m  , BMI Body mass index is 22 kg/m. GENERAL:  Well appearing HEENT: Pupils equal round and reactive, fundi not visualized, oral mucosa unremarkable NECK:  No jugular venous distention, waveform within normal limits, carotid upstroke brisk and symmetric, no bruits, no thyromegaly LUNGS:  Clear to auscultation bilaterally HEART:  RRR.  PMI not displaced or sustained,S1 and S2 within normal limits, no S3, no S4, no clicks, no rubs, no murmurs ABD:  Flat, positive bowel sounds normal in frequency in pitch, no bruits, no rebound, no guarding, no midline pulsatile mass, no hepatomegaly, no splenomegaly EXT:  2 plus pulses throughout, no edema, no cyanosis no clubbing SKIN:  No rashes no nodules NEURO:  Cranial nerves II through XII  grossly intact, motor grossly intact throughout PSYCH:  Cognitively intact, oriented to person place and time   ASSESSMENT AND PLAN: .    Assessment & Plan # Hypertension Hypertension is well-controlled with current medications. - Continue current antihypertensive regimen.  Amlodipne/olmesartan , clonidine , hydralazine , Imdur , and lasix . - Recheck blood pressure in 6 months.  # Hyperlipidemia Hyperlipidemia management ongoing with atorvastatin . Recent dosage adjustment based on lab results. - Check cholesterol levels today.  # Chronic pain from back to feet Chronic pain managed by another physician. Rheumatoid arthritis mentioned, but recent labs show no significant issues.  # Depression Depression related to aging concerns improved after her 83st birthday.  # Smoking cessation She quit smoking 1.5 to 2 years ago and remains abstinent from smoking and alcohol.  # Follow-up Follow-up planned to monitor conditions and adjust treatment as necessary. - Schedule follow-up appointment in 6 months.  Dispo: f/u in 6 months  Signed, Annabella Scarce, MD

## 2023-11-22 NOTE — Patient Instructions (Addendum)
 Medication Instructions:  Your physician recommends that you continue on your current medications as directed. Please refer to the Current Medication list given to you today.   *If you need a refill on your cardiac medications before your next appointment, please call your pharmacy*  Lab Work: LP/CMET TODAY   Testing/Procedures: NONE  Follow-Up: At [facility], you and your health needs are our priority.  As part of our continuing mission to provide you with exceptional heart care, our providers are all part of one team.  This team includes your primary Cardiologist (physician) and Advanced Practice Providers or APPs (Physician Assistants and Nurse Practitioners) who all work together to provide you with the care you need, when you need it.  Your next appointment:   6 month(s)  Provider:   Rosaline Bane, NP or Reche Finder, NP   1 YEAR WITH DR [facility]    We recommend signing up for the patient portal called MyChart.  Sign up information is provided on this After Visit Summary.  MyChart is used to connect with patients for Virtual Visits (Telemedicine).  Patients are able to view lab/test results, encounter notes, upcoming appointments, etc.  Non-urgent messages can be sent to your provider as well.   To learn more about what you can do with MyChart, go to [email or web address].

## 2023-11-23 LAB — COMPREHENSIVE METABOLIC PANEL WITH GFR
ALT: 5 IU/L (ref 0–32)
AST: 15 IU/L (ref 0–40)
Albumin: 3.9 g/dL (ref 3.7–4.7)
Alkaline Phosphatase: 71 IU/L (ref 44–121)
BUN/Creatinine Ratio: 15 (ref 12–28)
BUN: 19 mg/dL (ref 8–27)
Bilirubin Total: 0.3 mg/dL (ref 0.0–1.2)
CO2: 18 mmol/L — ABNORMAL LOW (ref 20–29)
Calcium: 9.1 mg/dL (ref 8.7–10.3)
Chloride: 106 mmol/L (ref 96–106)
Creatinine, Ser: 1.28 mg/dL — ABNORMAL HIGH (ref 0.57–1.00)
Globulin, Total: 3.1 g/dL (ref 1.5–4.5)
Glucose: 94 mg/dL (ref 70–99)
Potassium: 4.6 mmol/L (ref 3.5–5.2)
Sodium: 144 mmol/L (ref 134–144)
Total Protein: 7 g/dL (ref 6.0–8.5)
eGFR: 42 mL/min/{1.73_m2} — ABNORMAL LOW (ref >59)

## 2023-11-23 LAB — LIPID PANEL
Chol/HDL Ratio: 2.9 ratio (ref 0.0–4.4)
Cholesterol, Total: 202 mg/dL — ABNORMAL HIGH (ref 100–199)
HDL: 69 mg/dL (ref >39)
LDL Chol Calc (NIH): 119 mg/dL — ABNORMAL HIGH (ref 0–99)
Triglycerides: 76 mg/dL (ref 0–149)
VLDL Cholesterol Cal: 14 mg/dL (ref 5–40)

## 2023-11-26 ENCOUNTER — Encounter: Payer: Self-pay | Admitting: Family

## 2023-11-27 ENCOUNTER — Ambulatory Visit: Payer: Self-pay | Admitting: Cardiovascular Disease

## 2023-12-02 ENCOUNTER — Encounter: Payer: Self-pay | Admitting: Family

## 2023-12-02 ENCOUNTER — Telehealth: Payer: Self-pay

## 2023-12-02 ENCOUNTER — Other Ambulatory Visit: Payer: Self-pay | Admitting: *Deleted

## 2023-12-02 NOTE — Patient Instructions (Signed)
 Visit Information  Thank you for taking time to visit with me today. Please don't hesitate to contact me if I can be of assistance to you before our next scheduled appointment.  Your next care management appointment is by telephone on 01-02-2024 at 9:00 am  Telephone follow-up in 1 month  Please call the care guide team at [phone] if you need to cancel, schedule, or reschedule an appointment.   Please call the Suicide and Crisis Lifeline: 988 call the [place]  National Suicide Prevention Lifeline: 417-386-9685 or TTY: 850-062-9288 TTY 2263057255) to talk to a trained counselor call 1-800-273-TALK (toll free, 24 hour hotline) call the [facility]: 7754090927 call 911 if you are experiencing a Mental Health or Behavioral Health Crisis or need someone to talk to.  Rosina Forte, BSN RN [facility], [facility] RN Care Manager Direct Dial: [phone]  Fax: [phone]

## 2023-12-02 NOTE — Telephone Encounter (Signed)
 Hello,  Patient will be scheduled as soon as possible.  Auth Submission: APPROVED Site of care: Site of care: [facility] Payer: UHC DUAL COMPLETE Medication & CPT/J Code(s) submitted: Leqvio (Inclisiran) J1306 Diagnosis Code:  Route of submission (phone, fax, portal): PORTAL Phone # Fax # Auth type: Buy/Bill PB Units/visits requested: 284MG  X 3 VISITS Reference number: [id number] Approval from: 11/26/23 to 11/25/24

## 2023-12-02 NOTE — Patient Outreach (Signed)
 Complex Care Management   Visit Note  12/02/2023  Name:  [name] MRN: [id number] DOB: Sep 18, 1940  Situation: Referral received for Complex Care Management related to COPD and HTN I obtained verbal consent from Patient.  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   Ileus (HCC) 05/15/2021   Influenza A 07/15/2023   NSTEMI (non-ST elevated myocardial infarction) (HCC) 07/10/2023   Stroke (HCC)    SLIGHT RT SIDE WEAKNESS 2001   Unintentional weight loss 03/19/2022    Assessment: Patient Reported Symptoms:  Cognitive Cognitive Status: No symptoms reported Cognitive/Intellectual Conditions Management [RPT]: None reported or documented in medical history or problem list   Health Maintenance Behaviors: Annual physical exam Healing Pattern: Average Health Facilitated by: Rest, Prayer/meditation  Neurological Neurological Review of Symptoms: No symptoms reported Neurological Management Strategies: Routine screening Neurological Self-Management Outcome: 4 (good)  HEENT HEENT Symptoms Reported: No symptoms reported HEENT Management Strategies: Routine screening HEENT Comment: complained of bilateral ear pain x 1 week    Cardiovascular Cardiovascular Symptoms Reported: No symptoms reported Does patient have uncontrolled Hypertension?: No Is patient checking Blood Pressure at home?: Yes Cardiovascular Management Strategies:  Medication therapy, Routine screening Weight: 119 lb (54 kg) (patient reported) Cardiovascular Self-Management Outcome: 4 (good)  Respiratory Respiratory Symptoms Reported: Shortness of breath Respiratory Management Strategies: Coping strategies, Medication therapy, Routine screening  Endocrine Endocrine Symptoms Reported: No symptoms reported Is patient diabetic?: No Endocrine Self-Management Outcome: 4 (good)  Gastrointestinal Gastrointestinal Symptoms Reported: No symptoms reported Gastrointestinal Self-Management Outcome: 4 (good) Nutrition Risk Screen (CP): No indicators present  Genitourinary Genitourinary Symptoms Reported: No symptoms reported Genitourinary Management Strategies: Incontinence garment/pad  Integumentary Integumentary Symptoms Reported: No symptoms reported Skin Management Strategies: Routine screening Skin Self-Management Outcome: 4 (good)  Musculoskeletal Musculoskelatal Symptoms Reviewed: Weakness Musculoskeletal Management Strategies: Routine screening, Medication therapy Musculoskeletal Self-Management Outcome: 4 (good) Falls in the past year?: Yes Number of falls in past year: 2 or more Was there an injury with Fall?: Yes Fall Risk Category Calculator: 3 Patient Fall Risk Level: High Fall Risk Patient at Risk for Falls Due to: Impaired balance/gait, Impaired mobility Fall risk Follow up: Falls evaluation completed, Education provided  Psychosocial Psychosocial Symptoms Reported: Anxiety - if selected complete GAD Behavioral Management Strategies: Support system Major Change/Loss/Stressor/Fears (CP): Medical condition, self Techniques to Cope with Loss/Stress/Change: Medication Quality of Family Relationships: helpful, involved, supportive Do you feel physically threatened by others?: No      12/02/2023    9:21 AM  Depression screen PHQ 2/9  Decreased Interest 3  Down, Depressed, Hopeless 1  PHQ - 2 Score 4  Altered sleeping 1  Tired, decreased  energy 1  Change in appetite 3  Feeling bad or failure about yourself  1  Trouble concentrating 1  Moving slowly or fidgety/restless 0  Suicidal thoughts 1  PHQ-9 Score 12  Difficult doing work/chores Extremely dIfficult  Vitals:   12/01/23 0909  BP: 135/65    Medications Reviewed Today     Reviewed by Bertrum Rosina HERO, RN (Registered Nurse) on 12/02/23 at 0911  Med List Status: <None>   Medication Order Taking? Sig Documenting Provider Last Dose Status Informant  acetaminophen  (TYLENOL ) 500 MG tablet [id number]  Take 2 tablets (1,000 mg total) by mouth 3 (three) times daily. Zollie Lowers, MD  Active Self, Pharmacy Records  albuterol  (VENTOLIN  HFA) 108 (90 Base) MCG/ACT inhaler [id number]  INHALE 2 PUFFS INTO THE LUNGS EVERY 6 HOURS AS NEEDED FOR WHEEZE OR SHORTNESS OF SHERIDA Zollie Lowers, MD  Active   alendronate  (FOSAMAX ) 70 MG tablet [id number]  Take 1 tablet (70 mg total) by mouth every 7 (seven) days. Take with a full glass of water  on an empty stomach. Zollie Lowers, MD  Active Self, Pharmacy Records  amLODipine -olmesartan  (AZOR ) 10-40 MG tablet [id number]  Take 1 tablet by mouth daily. For blood pressure. Zollie Lowers, MD  Active   atorvastatin  (LIPITOR ) 80 MG tablet [id number]  Take 1 tablet (80 mg total) by mouth daily. Vannie Reche RAMAN, NP  Active Self, Pharmacy Records  budesonide -formoterol  (SYMBICORT ) 160-4.5 MCG/ACT inhaler [id number]  INHALE 2 PUFFS INTO THE LUNGS TWICE A DAY Stacks, Warren, MD  Active   cloNIDine  (CATAPRES ) 0.2 MG tablet [id number]  Take 1.5 tablets morning and evening, and 1 tablet mid-day. Raford Riggs, MD  Active   clopidogrel  (PLAVIX ) 75 MG tablet [id number]  Take 1 tablet (75 mg total) by mouth daily. Vannie Reche RAMAN, NP  Active Self, Pharmacy Records  colchicine  0.6 MG tablet [id number]  Take 1 tablet (0.6 mg total) by mouth daily. Tolia, Sunit, DO  Expired 11/22/23 2359   furosemide  (LASIX ) 20 MG tablet [id number]  Take 1 tablet (20 mg  total) by mouth daily. For swelling Vannie Reche RAMAN, NP  Active Self, Pharmacy Records  hydrALAZINE  (APRESOLINE ) 100 MG tablet [id number]  Take 1 tablet (100 mg total) by mouth 3 (three) times daily. Vannie Reche RAMAN, NP  Expired 11/22/23 2359            Med Note SERENE, [facility]   Mon Nov 04, 2023  9:26 AM) Patient states easing self off medicines. Not sure of last dose.   isosorbide  mononitrate (IMDUR ) 60 MG 24 hr tablet [id number]  Take 60 mg by mouth daily. Patient states prescribed daily for chest pain [provider]  Active Self  Magnesium  250 MG TABS [id number]  Take 1 tablet (250 mg total) by mouth daily. Zollie Lowers, MD  Active Self, Pharmacy Records           Med Note SERENE, [facility]   Mon Nov 04, 2023  9:27 AM) Per patient, takes q 2-3 days  pantoprazole  (PROTONIX ) 40 MG tablet [id number]  Take 1 tablet (40 mg total) by mouth daily. Tolia, Sunit, DO  Expired 11/22/23 2359   potassium chloride  SA (KLOR-CON  M) 20 MEQ tablet [id number]  Take 1 tablet (20 mEq total) by mouth daily. For potassium replacement/ supplement Zollie Lowers, MD  Active   pregabalin  (LYRICA ) 300 MG capsule [id number]  Take 1 capsule (300 mg total) by mouth at bedtime. To reduce back pain Zollie Lowers, MD  Active   rOPINIRole  (REQUIP ) 1 MG tablet [id number]  TAKE 1 TABLET (1 MG TOTAL) BY MOUTH AT BEDTIME. FOR LEG CRAMPS Zollie Lowers, MD  Active   SUMAtriptan  (IMITREX ) 100 MG tablet [id number]  Take one at onset of HA. May repeat in 2 hours if  headache persists or recurs. Limit two per 24 hours Zollie Lowers, MD  Active Self, Pharmacy Records           Med Note [facility] [place], [place]   Wed Jul 10, 2023  6:22 PM) Prn   tizanidine  (ZANAFLEX ) 2 MG capsule [id number]  Take 2 mg by mouth at bedtime. [provider]  Active   vitamin B-12 1000 MCG tablet [id number]  Take 1 tablet (1,000 mcg total) by mouth daily. Vicci Afton CROME, MD  Active Self, Pharmacy Records  Vitamin D , Ergocalciferol ,  (DRISDOL ) 1.25 MG (50000 UNIT) CAPS capsule [id number]  Take 1 capsule (50,000 Units total) by mouth every 7 (seven) days. Zollie Lowers, MD  Active Self, Pharmacy Records           Med Note [facility] [place], [place]   Wed Jul 10, 2023  6:14 PM)              Recommendation:   Referral to: RD  Follow Up Plan:   Telephone follow-up in 1 month  Rosina Forte, BSN RN [facility], [facility] Health RN Care Manager Direct Dial: [phone]  Fax: [phone]

## 2023-12-03 ENCOUNTER — Other Ambulatory Visit: Payer: Self-pay | Admitting: Family Medicine

## 2023-12-03 DIAGNOSIS — J441 Chronic obstructive pulmonary disease with (acute) exacerbation: Secondary | ICD-10-CM

## 2023-12-07 ENCOUNTER — Other Ambulatory Visit (HOSPITAL_BASED_OUTPATIENT_CLINIC_OR_DEPARTMENT_OTHER): Payer: Self-pay | Admitting: Family

## 2023-12-07 DIAGNOSIS — I10 Essential (primary) hypertension: Secondary | ICD-10-CM

## 2023-12-09 ENCOUNTER — Ambulatory Visit: Admitting: Family Medicine

## 2023-12-10 ENCOUNTER — Encounter: Payer: Self-pay | Admitting: Family Medicine

## 2023-12-12 ENCOUNTER — Ambulatory Visit: Admitting: Family Medicine

## 2023-12-13 ENCOUNTER — Telehealth: Payer: Self-pay

## 2023-12-13 ENCOUNTER — Other Ambulatory Visit (HOSPITAL_COMMUNITY): Payer: Self-pay

## 2023-12-13 NOTE — Telephone Encounter (Signed)
 Pharmacy Patient Advocate Encounter   Received notification from Onbase that prior authorization for BUDESONIDE -FORMOTEROL  is required/requested.   Insurance verification completed.   The patient is insured through [employer] .   Per test claim:  SYMBICORT  is preferred by the insurance.  If suggested medication is appropriate, Please send in a new RX and discontinue this one. If not, please advise as to why it's not appropriate so that we may request a Prior Authorization. Please note, some preferred medications may still require a PA.  If the suggested medications have not been trialed and there are no contraindications to their use, the PA will not be submitted, as it will not be approved.    Per test claim: Refill too soon. PA is not needed at this time. Medication was filled 12/03/23. Next eligible fill date is 12/26/23.    Pharmacy may have to use DAW 9

## 2023-12-16 ENCOUNTER — Ambulatory Visit (INDEPENDENT_AMBULATORY_CARE_PROVIDER_SITE_OTHER): Admitting: Family Medicine

## 2023-12-16 ENCOUNTER — Encounter: Payer: Self-pay | Admitting: Family Medicine

## 2023-12-16 VITALS — BP 127/64 | HR 61 | Temp 98.0°F | Ht 61.0 in | Wt 116.0 lb

## 2023-12-16 DIAGNOSIS — K2100 Gastro-esophageal reflux disease with esophagitis, without bleeding: Secondary | ICD-10-CM | POA: Diagnosis not present

## 2023-12-16 DIAGNOSIS — N1831 Chronic kidney disease, stage 3a: Secondary | ICD-10-CM | POA: Diagnosis not present

## 2023-12-16 DIAGNOSIS — I10 Essential (primary) hypertension: Secondary | ICD-10-CM | POA: Diagnosis not present

## 2023-12-16 DIAGNOSIS — R634 Abnormal weight loss: Secondary | ICD-10-CM | POA: Diagnosis not present

## 2023-12-16 MED ORDER — FLUTICASONE FUROATE-VILANTEROL 200-25 MCG/ACT IN AEPB
1.0000 | INHALATION_SPRAY | Freq: Every day | RESPIRATORY_TRACT | 11 refills | Status: AC
Start: 2023-12-16 — End: ?

## 2023-12-16 MED ORDER — NEOMYCIN-POLYMYXIN-HC 3.5-10000-1 OP SUSP
3.0000 [drp] | Freq: Four times a day (QID) | OPHTHALMIC | 0 refills | Status: DC
Start: 2023-12-16 — End: 2024-04-15

## 2023-12-16 MED ORDER — POTASSIUM CHLORIDE CRYS ER 20 MEQ PO TBCR: 20.0000 meq | EXTENDED_RELEASE_TABLET | Freq: Every day | ORAL | 5 refills | Status: DC

## 2023-12-16 MED ORDER — AMLODIPINE-OLMESARTAN 10-40 MG PO TABS: 1.0000 | ORAL_TABLET | Freq: Every day | ORAL | 1 refills | Status: DC

## 2023-12-16 MED ORDER — PANTOPRAZOLE SODIUM 40 MG PO TBEC: 40.0000 mg | DELAYED_RELEASE_TABLET | Freq: Every day | ORAL | 0 refills | Status: DC

## 2023-12-16 MED ORDER — SUMATRIPTAN SUCCINATE 100 MG PO TABS: ORAL_TABLET | ORAL | 11 refills | Status: DC

## 2023-12-16 NOTE — Progress Notes (Signed)
 Subjective:  Patient ID: [name], female    DOB: Jan 19, 1941  Age: 83 y.o. MRN: [id number]  CC: Medical Management of Chronic Issues (Ongoing lower back/ hip/ leg pain), Eye Problem (Right eye swelling started yesterday and itching the day before. Some drainage coming out of the corner. Blurry vision on that side. ), and Medication Problem (Takes medication in the morning but still having chest pain can she take twice? //Takes cholesterol meds 3 times daily//*does not know name of either medication. )   HPI [name] presents for  follow-up of hypertension. Patient has no history of headache chest pain or shortness of breath or recent cough. Patient also denies symptoms of TIA such as focal numbness or weakness. Patient denies side effects from medication. States taking it regularly.  She continues to lose weight.  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   She has a past surgical history that includes Abdominal hysterectomy; Anterior cervical corpectomy (03/07/2012); Tonsillectomy; Multiple tooth extractions; Cataract extraction w/PHACO (Left, 04/12/2017); Cataract extraction w/PHACO (Right, 05/03/2017); and LEFT HEART CATH AND CORONARY ANGIOGRAPHY (N/A, 07/11/2023).   Her family history includes  Asthma in her daughter; Bipolar disorder in her daughter, daughter, daughter, daughter, and son; Drug abuse in her daughter; Heart disease in her daughter and daughter; Hypertension in her daughter. She was adopted.She reports that she quit smoking about 20 months ago. Her smoking use included cigarettes. She started smoking about 63 years ago. She has a 30.9 pack-year smoking history. She has been exposed to tobacco smoke. She has never used smokeless tobacco. She reports that she does not currently use alcohol. She reports that she does not use drugs.  Current Outpatient Medications on File Prior to Visit  Medication Sig Dispense Refill   acetaminophen  (TYLENOL ) 500 MG tablet Take 2 tablets (1,000 mg total) by mouth 3 (three) times daily. 180 tablet PRN   albuterol  (VENTOLIN  HFA) 108 (90 Base) MCG/ACT inhaler INHALE 2 PUFFS INTO THE LUNGS EVERY 6 HOURS AS NEEDED FOR WHEEZE OR SHORTNESS OF BREATH 6.7 each 0   alendronate  (FOSAMAX ) 70 MG tablet Take 1 tablet (70 mg total) by mouth every 7 (seven) days. Take with a full glass of water  on an empty stomach. 13 tablet 3   atorvastatin  (LIPITOR ) 80 MG tablet Take 1 tablet (80 mg total) by mouth daily. 90 tablet 3   cloNIDine  (CATAPRES ) 0.2 MG tablet Take 1.5 tablets morning and evening, and 1 tablet mid-day. 120 tablet 3   clopidogrel  (PLAVIX ) 75 MG tablet Take 1 tablet (75 mg total) by mouth daily. 90 tablet 3   colchicine  0.6 MG tablet Take 1 tablet (0.6 mg total) by mouth daily. 90 tablet 0   furosemide  (LASIX ) 20 MG tablet TAKE 1 TABLET (20 MG TOTAL) BY MOUTH DAILY. FOR SWELLING 90 tablet 3   isosorbide  mononitrate (IMDUR ) 60 MG 24 hr tablet TAKE 1 TABLET (60 MG TOTAL) BY MOUTH DAILY. FOR BLOOD PRESSURE AND TO PREVENT CHEST PAIN. 90 tablet 3   Magnesium   250 MG TABS Take 1 tablet (250 mg total) by mouth daily. 100 tablet PRN   pregabalin  (LYRICA ) 300 MG capsule Take 1 capsule (300 mg total) by mouth at bedtime. To reduce back pain 90 capsule 3    rOPINIRole  (REQUIP ) 1 MG tablet TAKE 1 TABLET (1 MG TOTAL) BY MOUTH AT BEDTIME. FOR LEG CRAMPS 90 tablet 1   tizanidine  (ZANAFLEX ) 2 MG capsule Take 2 mg by mouth at bedtime.     vitamin B-12 1000 MCG tablet Take 1 tablet (1,000 mcg total) by mouth daily. 30 tablet 2   Vitamin D , Ergocalciferol , (DRISDOL ) 1.25 MG (50000 UNIT) CAPS capsule Take 1 capsule (50,000 Units total) by mouth every 7 (seven) days. 13 capsule 3   hydrALAZINE  (APRESOLINE ) 100 MG tablet Take 1 tablet (100 mg total) by mouth 3 (three) times daily. 270 tablet 3   No current facility-administered medications on file prior to visit.    ROS Review of Systems  Constitutional: Negative.   HENT: Negative.    Eyes:  Negative for visual disturbance.  Respiratory:  Negative for shortness of breath.   Cardiovascular:  Negative for chest pain.  Gastrointestinal:  Negative for abdominal pain.  Musculoskeletal:  Negative for arthralgias.    Objective:  BP 127/64   Pulse 61   Temp 98 F (36.7 C)   Ht 5' 1 (1.549 m)   Wt 116 lb (52.6 kg)   SpO2 97%   BMI 21.92 kg/m   BP Readings from Last 3 Encounters:  12/16/23 127/64  12/01/23 135/65  11/22/23 126/62    Wt Readings from Last 3 Encounters:  12/16/23 116 lb (52.6 kg)  12/02/23 119 lb (54 kg)  11/22/23 119 lb 4.8 oz (54.1 kg)     Physical Exam Constitutional:      General: She is not in acute distress.    Appearance: She is well-developed.  Cardiovascular:     Rate and Rhythm: Normal rate and regular rhythm.  Pulmonary:     Breath sounds: Normal breath sounds.  Musculoskeletal:        General: Normal range of motion.  Skin:    General: Skin is warm and dry.  Neurological:     Mental Status: She is alert and oriented to person, place, and time.       Assessment & Plan:  Essential hypertension -     amLODIPine -Olmesartan ; Take 1 tablet by mouth daily. For blood pressure.  Dispense: 90 tablet; Refill: 1 -     CBC with Differential/Platelet -      CMP14+EGFR  Unintentional weight loss -     CBC with Differential/Platelet -     CMP14+EGFR -     Prealbumin  Stage 3a chronic kidney disease (HCC) -     CBC with Differential/Platelet -     CMP14+EGFR  Gastroesophageal reflux disease with esophagitis without hemorrhage -     CBC with Differential/Platelet -     CMP14+EGFR  Other orders -     SUMAtriptan  Succinate; Take one at onset of HA. May repeat in 2 hours if headache persists or recurs. Limit two per 24 hours  Dispense: 10 tablet; Refill: 11 -     Fluticasone  Furoate-Vilanterol; Inhale 1 puff into the lungs daily.  Dispense: 1 each; Refill: 11 -     Neomycin -Polymyxin-HC; Place 3 drops into both eyes 4 (four) times daily.  Dispense: 7.5 mL; Refill: 0 -     Pantoprazole  Sodium; Take 1 tablet (40 mg total) by mouth  daily.  Dispense: 30 tablet; Refill: 0 -     Potassium Chloride  Crys ER; Take 1 tablet (20 mEq total) by mouth daily. For potassium replacement/ supplement  Dispense: 30 tablet; Refill: 5    Allergies as of 12/16/2023       Reactions   Penicillins Anaphylaxis, Swelling, Rash, Other (See Comments)   Has patient had a PCN reaction causing immediate rash, facial/tongue/throat swelling, SOB or lightheadedness with hypotension: yes Has patient had a PCN reaction causing severe rash involving mucus membranes or skin necrosis: no Has patient had a PCN reaction that required hospitalization: yes Has patient had a PCN reaction occurring within the last 10 years: no If all of the above answers are NO, then may proceed with Cephalosporin use.        Medication List        Accurate as of December 16, 2023  9:19 PM. If you have any questions, ask your nurse or doctor.          STOP taking these medications    budesonide -formoterol  160-4.5 MCG/ACT inhaler Commonly known as: SYMBICORT  Stopped by: [name]       TAKE these medications    acetaminophen  500 MG tablet Commonly known as: TYLENOL  Take 2 tablets  (1,000 mg total) by mouth 3 (three) times daily.   albuterol  108 (90 Base) MCG/ACT inhaler Commonly known as: VENTOLIN  HFA INHALE 2 PUFFS INTO THE LUNGS EVERY 6 HOURS AS NEEDED FOR WHEEZE OR SHORTNESS OF BREATH   alendronate  70 MG tablet Commonly known as: FOSAMAX  Take 1 tablet (70 mg total) by mouth every 7 (seven) days. Take with a full glass of water  on an empty stomach.   amLODipine -olmesartan  10-40 MG tablet Commonly known as: AZOR  Take 1 tablet by mouth daily. For blood pressure.   atorvastatin  80 MG tablet Commonly known as: LIPITOR  Take 1 tablet (80 mg total) by mouth daily.   cloNIDine  0.2 MG tablet Commonly known as: CATAPRES  Take 1.5 tablets morning and evening, and 1 tablet mid-day.   clopidogrel  75 MG tablet Commonly known as: PLAVIX  Take 1 tablet (75 mg total) by mouth daily.   colchicine  0.6 MG tablet Take 1 tablet (0.6 mg total) by mouth daily.   cyanocobalamin  1000 MCG tablet Take 1 tablet (1,000 mcg total) by mouth daily.   fluticasone  furoate-vilanterol 200-25 MCG/ACT Aepb Commonly known as: Breo Ellipta  Inhale 1 puff into the lungs daily. Started by: [name]   furosemide  20 MG tablet Commonly known as: LASIX  TAKE 1 TABLET (20 MG TOTAL) BY MOUTH DAILY. FOR SWELLING   hydrALAZINE  100 MG tablet Commonly known as: APRESOLINE  Take 1 tablet (100 mg total) by mouth 3 (three) times daily.   isosorbide  mononitrate 60 MG 24 hr tablet Commonly known as: IMDUR  TAKE 1 TABLET (60 MG TOTAL) BY MOUTH DAILY. FOR BLOOD PRESSURE AND TO PREVENT CHEST PAIN.   Magnesium  250 MG Tabs Take 1 tablet (250 mg total) by mouth daily.   neomycin -polymyxin-hydrocortisone 3.5-10000-1 ophthalmic suspension Commonly known as: CORTISPORIN Place 3 drops into both eyes 4 (four) times daily. Started by: [name]   pantoprazole  40 MG tablet Commonly known as: Protonix  Take 1 tablet (40 mg total) by mouth daily.   potassium chloride  SA 20 MEQ tablet Commonly known  as: KLOR-CON  M Take 1 tablet (20 mEq total) by mouth daily. For potassium replacement/ supplement   pregabalin  300 MG capsule Commonly known as: Lyrica  Take 1 capsule (300 mg total) by mouth at bedtime. To reduce back  pain   rOPINIRole  1 MG tablet Commonly known as: REQUIP  TAKE 1 TABLET (1 MG TOTAL) BY MOUTH AT BEDTIME. FOR LEG CRAMPS   SUMAtriptan  100 MG tablet Commonly known as: Imitrex  Take one at onset of HA. May repeat in 2 hours if headache persists or recurs. Limit two per 24 hours   tizanidine  2 MG capsule Commonly known as: ZANAFLEX  Take 2 mg by mouth at bedtime.   Vitamin D  (Ergocalciferol ) 1.25 MG (50000 UNIT) Caps capsule Commonly known as: DRISDOL  Take 1 capsule (50,000 Units total) by mouth every 7 (seven) days.         Follow-up: Return in about 3 months (around 03/17/2024).  [name], M.D.

## 2023-12-17 ENCOUNTER — Other Ambulatory Visit

## 2023-12-17 DIAGNOSIS — I10 Essential (primary) hypertension: Secondary | ICD-10-CM | POA: Diagnosis not present

## 2023-12-17 DIAGNOSIS — K2100 Gastro-esophageal reflux disease with esophagitis, without bleeding: Secondary | ICD-10-CM | POA: Diagnosis not present

## 2023-12-17 DIAGNOSIS — R634 Abnormal weight loss: Secondary | ICD-10-CM | POA: Diagnosis not present

## 2023-12-17 DIAGNOSIS — N1831 Chronic kidney disease, stage 3a: Secondary | ICD-10-CM | POA: Diagnosis not present

## 2023-12-18 ENCOUNTER — Ambulatory Visit: Payer: Self-pay | Admitting: Family Medicine

## 2023-12-18 LAB — CMP14+EGFR
ALT: 5 IU/L (ref 0–32)
AST: 17 IU/L (ref 0–40)
Albumin: 4 g/dL (ref 3.7–4.7)
Alkaline Phosphatase: 58 IU/L (ref 44–121)
BUN/Creatinine Ratio: 17 (ref 12–28)
BUN: 20 mg/dL (ref 8–27)
Bilirubin Total: 0.3 mg/dL (ref 0.0–1.2)
CO2: 18 mmol/L — ABNORMAL LOW (ref 20–29)
Calcium: 9 mg/dL (ref 8.7–10.3)
Chloride: 106 mmol/L (ref 96–106)
Creatinine, Ser: 1.16 mg/dL — ABNORMAL HIGH (ref 0.57–1.00)
Globulin, Total: 2.9 g/dL (ref 1.5–4.5)
Glucose: 62 mg/dL — ABNORMAL LOW (ref 70–99)
Potassium: 4.2 mmol/L (ref 3.5–5.2)
Sodium: 144 mmol/L (ref 134–144)
Total Protein: 6.9 g/dL (ref 6.0–8.5)
eGFR: 47 mL/min/1.73 — ABNORMAL LOW (ref >59)

## 2023-12-18 LAB — CBC WITH DIFFERENTIAL/PLATELET
Basophils Absolute: 0 x10E3/uL (ref 0.0–0.2)
Basos: 1 %
EOS (ABSOLUTE): 0.1 x10E3/uL (ref 0.0–0.4)
Eos: 2 %
Hematocrit: 36.2 % (ref 34.0–46.6)
Hemoglobin: 11.5 g/dL (ref 11.1–15.9)
Immature Grans (Abs): 0 x10E3/uL (ref 0.0–0.1)
Immature Granulocytes: 0 %
Lymphocytes Absolute: 1.5 x10E3/uL (ref 0.7–3.1)
Lymphs: 34 %
MCH: 30.2 pg (ref 26.6–33.0)
MCHC: 31.8 g/dL (ref 31.5–35.7)
MCV: 95 fL (ref 79–97)
Monocytes Absolute: 0.3 x10E3/uL (ref 0.1–0.9)
Monocytes: 8 %
Neutrophils Absolute: 2.4 x10E3/uL (ref 1.4–7.0)
Neutrophils: 55 %
RBC: 3.81 x10E6/uL (ref 3.77–5.28)
RDW: 13.5 % (ref 11.7–15.4)
WBC: 4.4 x10E3/uL (ref 3.4–10.8)

## 2023-12-18 LAB — PREALBUMIN: PREALBUMIN: 24 mg/dL (ref 9–32)

## 2023-12-18 NOTE — Progress Notes (Signed)
 Hello [name],  Your lab result is normal and/or stable.Some minor variations that are not significant are commonly marked abnormal, but do not represent any medical problem for you.  Best regards, Mechele Claude, M.D.

## 2023-12-26 ENCOUNTER — Encounter: Payer: Self-pay | Admitting: Family

## 2023-12-31 ENCOUNTER — Other Ambulatory Visit: Payer: Self-pay | Admitting: Family Medicine

## 2023-12-31 DIAGNOSIS — J441 Chronic obstructive pulmonary disease with (acute) exacerbation: Secondary | ICD-10-CM

## 2024-01-02 ENCOUNTER — Telehealth: Payer: Self-pay | Admitting: *Deleted

## 2024-01-02 ENCOUNTER — Encounter: Payer: Self-pay | Admitting: *Deleted

## 2024-01-02 NOTE — Patient Instructions (Signed)
 Lavella LULLA Carrel - I am sorry you were unable to  complete outreach for today. I have rescheduled you for August 12th at 1145 am. I work with Zollie Lowers, MD and am calling to support your healthcare needs. Please contact me at [phone] at your earliest convenience. I look forward to speaking with you soon.   Thank you,  Krist Rosenboom, RN, BSN, ACM RN Care Manager [employer] [phone]

## 2024-01-07 ENCOUNTER — Other Ambulatory Visit: Payer: Self-pay | Admitting: *Deleted

## 2024-01-07 NOTE — Patient Outreach (Signed)
 Complex Care Management   Visit Note  01/07/2024  Name:  [name] MRN: [id number] DOB: September 10, 1940  Situation: Referral received for Complex Care Management related to COPD and HTN I obtained verbal consent from Patient.  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   Ileus (HCC) 05/15/2021   Influenza A 07/15/2023   NSTEMI (non-ST elevated myocardial infarction) (HCC) 07/10/2023   Stroke (HCC)    SLIGHT RT SIDE WEAKNESS 2001   Unintentional weight loss 03/19/2022    Assessment: Patient Reported Symptoms:  Cognitive Cognitive Status: No symptoms reported Cognitive/Intellectual Conditions Management [RPT]: None reported or documented in medical history or problem list   Health Maintenance Behaviors: Annual physical exam Healing Pattern: Average Health Facilitated by: Rest  Neurological Neurological Review of Symptoms: Dizziness Neurological Management Strategies: Routine screening Neurological Self-Management Outcome: 3 (uncertain)  HEENT HEENT Symptoms Reported: No symptoms reported HEENT Management Strategies: Routine screening HEENT Self-Management Outcome: 4 (good)    Cardiovascular Cardiovascular Symptoms Reported: No symptoms reported Does patient have uncontrolled Hypertension?: No Is patient checking Blood Pressure at home?: No Cardiovascular Management Strategies: Routine screening, Medication  therapy Cardiovascular Self-Management Outcome: 4 (good)  Respiratory Respiratory Symptoms Reported: Shortness of breath Respiratory Management Strategies: Routine screening Respiratory Self-Management Outcome: 4 (good)  Endocrine Endocrine Symptoms Reported: No symptoms reported Is patient diabetic?: No Endocrine Self-Management Outcome: 4 (good)  Gastrointestinal Gastrointestinal Symptoms Reported: No symptoms reported Gastrointestinal Self-Management Outcome: 4 (good) Nutrition Risk Screen (CP): No indicators present  Genitourinary Genitourinary Symptoms Reported: No symptoms reported Genitourinary Self-Management Outcome: 4 (good)  Integumentary Integumentary Symptoms Reported: No symptoms reported Skin Self-Management Outcome: 4 (good)  Musculoskeletal Musculoskelatal Symptoms Reviewed: Unsteady gait, Weakness Additional Musculoskeletal Details: right leg weakness Musculoskeletal Management Strategies: Medical device, Routine screening Musculoskeletal Self-Management Outcome: 4 (good) Falls in the past year?: Yes Number of falls in past year: 2 or more Was there an injury with Fall?: Yes Fall Risk Category Calculator: 3 Patient Fall Risk Level: High Fall Risk Patient at Risk for Falls Due to: History of fall(s), Impaired balance/gait, Impaired mobility Fall risk Follow up: Falls evaluation completed, Education provided  Psychosocial Psychosocial Symptoms Reported: No symptoms reported Behavioral Management Strategies: Support system Behavioral Health Self-Management Outcome: 4 (good)   Quality of Family Relationships: helpful, involved, supportive Do you feel physically threatened by others?: No      01/07/2024   12:05 PM  Depression screen PHQ 2/9  Decreased Interest 0  Down, Depressed, Hopeless 0  PHQ - 2 Score 0    There were no vitals filed for this visit.  Medications Reviewed Today     Reviewed by Bertrum Rosina HERO, RN (Registered Nurse) on 01/07/24 at 1159   Med List Status: <None>   Medication Order Taking? Sig Documenting Provider Last Dose Status Informant  acetaminophen  (TYLENOL ) 500 MG tablet [id number] Yes Take 2 tablets (1,000 mg total) by mouth 3 (  three) times daily. Zollie Lowers, MD  Active Self, Pharmacy Records  albuterol  (VENTOLIN  HFA) 108 [phone] Base) MCG/ACT inhaler [id number]  INHALE 2 PUFFS INTO THE LUNGS EVERY 6 HOURS AS NEEDED FOR WHEEZE OR SHORTNESS OF BREATH  Patient not taking: Reported on 01/07/2024   Zollie Lowers, MD  Active   alendronate  (FOSAMAX ) 70 MG tablet [id number] Yes Take 1 tablet (70 mg total) by mouth every 7 (seven) days. Take with a full glass of water  on an empty stomach. Zollie Lowers, MD  Active Self, Pharmacy Records  amLODipine -olmesartan  (AZOR ) 10-40 MG tablet [id number] Yes Take 1 tablet by mouth daily. For blood pressure. Zollie Lowers, MD  Active   atorvastatin  (LIPITOR ) 80 MG tablet [id number] Yes Take 1 tablet (80 mg total) by mouth daily. Vannie Reche RAMAN, NP  Active Self, Pharmacy Records  cloNIDine  (CATAPRES ) 0.2 MG tablet [id number]  Take 1.5 tablets morning and evening, and 1 tablet mid-day.  Patient not taking: Reported on 01/07/2024   Raford Riggs, MD  Active   clopidogrel  (PLAVIX ) 75 MG tablet [id number]  Take 1 tablet (75 mg total) by mouth daily.  Patient not taking: Reported on 01/07/2024   Vannie Reche RAMAN, NP  Active Self, Pharmacy Records  colchicine  0.6 MG tablet [id number]  Take 1 tablet (0.6 mg total) by mouth daily.  Patient not taking: Reported on 01/07/2024   Michele Richardson, DO  Expired 12/16/23 2359   fluticasone  furoate-vilanterol (BREO ELLIPTA ) 200-25 MCG/ACT AEPB [id number] Yes Inhale 1 puff into the lungs daily. Zollie Lowers, MD  Active   furosemide  (LASIX ) 20 MG tablet [id number] Yes TAKE 1 TABLET (20 MG TOTAL) BY MOUTH DAILY. FOR SWELLING Raford Riggs, MD  Active   hydrALAZINE  (APRESOLINE ) 100 MG tablet [id number]  Take 1 tablet (100 mg total) by mouth 3 (three) times daily.   Patient not taking: Reported on 01/07/2024   Vannie Reche RAMAN, NP  Expired 11/22/23 2359            Med Note SERENE, [facility]   Mon Nov 04, 2023  9:26 AM) Patient states easing self off medicines. Not sure of last dose.   isosorbide  mononitrate (IMDUR ) 60 MG 24 hr tablet [id number] Yes TAKE 1 TABLET (60 MG TOTAL) BY MOUTH DAILY. FOR BLOOD PRESSURE AND TO PREVENT CHEST PAIN. Raford Riggs, MD  Active   Magnesium  250 MG TABS [id number] Yes Take 1 tablet (250 mg total) by mouth daily. Zollie Lowers, MD  Active Self, Pharmacy Records           Med Note SERENE, [facility]   Mon Nov 04, 2023  9:27 AM) Per patient, takes q 2-3 days  neomycin -polymyxin-hydrocortisone (CORTISPORIN) 3.5-10000-1 ophthalmic suspension [id number]  Place 3 drops into both eyes 4 (four) times daily.  Patient not taking: Reported on 01/07/2024   Zollie Lowers, MD  Active   pantoprazole  (PROTONIX ) 40 MG tablet [id number]  Take 1 tablet (40 mg total) by mouth daily.  Patient not taking: Reported on 01/07/2024   Zollie Lowers, MD  Active   potassium chloride  SA (KLOR-CON  M) 20 MEQ tablet [id number] Yes Take 1 tablet (20 mEq total) by mouth daily. For potassium replacement/ supplement Zollie Lowers, MD  Active   pregabalin  (LYRICA ) 300 MG capsule [id number]  Take 1 capsule (300 mg total) by mouth at bedtime. To reduce back pain  Patient not taking: Reported on 01/07/2024   Zollie Lowers, MD  Active   rOPINIRole  (REQUIP ) 1 MG tablet [id number]  TAKE 1 TABLET (1 MG TOTAL) BY MOUTH  AT BEDTIME. FOR LEG CRAMPS  Patient not taking: Reported on 01/07/2024   Zollie Lowers, MD  Active   SUMAtriptan  (IMITREX ) 100 MG tablet [id number]  Take one at onset of HA. May repeat in 2 hours if headache persists or recurs. Limit two per 24 hours  Patient not taking: Reported on 01/07/2024   Zollie Lowers, MD  Active   tizanidine  (ZANAFLEX ) 2 MG capsule [id number] Yes Take 2 mg by mouth at bedtime. [provider]  Active   vitamin B-12 1000  MCG tablet [id number]  Take 1 tablet (1,000 mcg total) by mouth daily.  Patient not taking: Reported on 01/07/2024   Vicci Afton CROME, MD  Active Self, Pharmacy Records  Vitamin D , Ergocalciferol , (DRISDOL ) 1.25 MG (50000 UNIT) CAPS capsule [id number] Yes Take 1 capsule (50,000 Units total) by mouth every 7 (seven) days. Zollie Lowers, MD  Active Self, Pharmacy Records           Med Note [facility] [place], [place] A   Wed Jul 10, 2023  6:14 PM)              Recommendation:   Continue Current Plan of Care  Follow Up Plan:   Telephone follow-up in 1 month  Rosina Forte, BSN RN [facility], [facility] RN Care Manager Direct Dial: [phone]  Fax: [phone]

## 2024-01-07 NOTE — Patient Instructions (Signed)
 Visit Information  Thank you for taking time to visit with me today. Please don't hesitate to contact me if I can be of assistance to you before our next scheduled appointment.  Your next care management appointment is by telephone on 02-04-2024 at 9:30 am  Telephone follow-up in 1 month  Please call the care guide team at [phone] if you need to cancel, schedule, or reschedule an appointment.   Please call the Suicide and Crisis Lifeline: 988 call the [place]  National Suicide Prevention Lifeline: 507 823 9217 or TTY: 780-883-9239 TTY 5707446077) to talk to a trained counselor call 1-800-273-TALK (toll free, 24 hour hotline) call the [facility]: [phone] call 911 if you are experiencing a Mental Health or Behavioral Health Crisis or need someone to talk to.  Rosina Forte, BSN RN [facility], [facility] RN Care Manager Direct Dial: [phone]  Fax: [phone]

## 2024-01-13 ENCOUNTER — Other Ambulatory Visit (HOSPITAL_COMMUNITY): Payer: Self-pay

## 2024-01-13 ENCOUNTER — Telehealth: Payer: Self-pay

## 2024-01-13 NOTE — Telephone Encounter (Signed)
 Pharmacy Patient Advocate Encounter   Received notification from CoverMyMeds that prior authorization for fluticasone  furoate-vilanterol (BREO ELLIPTA ) 200-25 MCG/ACT AEPB  is required/requested.   Insurance verification completed.   The patient is insured through [facility] .   Per test claim: Refill too soon. PA is not needed at this time. Medication Next eligible fill date is 02/05/2024

## 2024-01-15 ENCOUNTER — Ambulatory Visit: Payer: Self-pay

## 2024-01-15 ENCOUNTER — Other Ambulatory Visit (HOSPITAL_COMMUNITY): Payer: Self-pay

## 2024-01-15 NOTE — Telephone Encounter (Signed)
 Appointment scheduled 01/16/2024

## 2024-01-15 NOTE — Telephone Encounter (Signed)
 FYI Only or Action Required?: FYI only for provider.  Patient was last seen in primary care on 12/16/2023 by Zollie Lowers, MD.  Called Nurse Triage reporting Back Pain.  Symptoms began ongoing and worsening x week.  Interventions attempted: Other: pt stated every pill he gives me to try to relieve this back pain but no relief.  Symptoms are: gradually worsening.  Triage Disposition: See PCP When Office is Open (Within 3 Days)  Patient/caregiver understands and will follow disposition?: Yes      Copied from CRM [id number]. Topic: Appointments - Appointment Scheduling >> Jan 15, 2024  3:52 PM Tonda B wrote: Patient/patient representative is calling to schedule an appointment. Refer to attachments for appointment information.  Pt is severe pain in her legs and back Reason for Disposition  [1] MODERATE back pain (e.g., interferes with normal activities) AND [2] present > 3 days  Answer Assessment - Initial Assessment Questions 1. ONSET: When did the pain begin? (e.g., minutes, hours, days)     Ongoing x worsening x 1 week 2. LOCATION: Where does it hurt? (upper, mid or lower back)     lower 3. SEVERITY: How bad is the pain?  (e.g., Scale 1-10; mild, moderate, or severe)     10/10 4. PATTERN: Is the pain constant? (e.g., yes, no; constant, intermittent)      Constant  5. RADIATION: Does the pain shoot into your legs or somewhere else?  right leg is worse than left 6. CAUSE:  What do you think is causing the back pain?      Possible sciatic nerve 7. BACK OVERUSE:  Any recent lifting of heavy objects, strenuous work or exercise?     no 8. MEDICINES: What have you taken so far for the pain? (e.g., nothing, acetaminophen , NSAIDS)     na 9. NEUROLOGIC SYMPTOMS: Do you have any weakness, numbness, or problems with bowel/bladder control?     Numbness and tingling 10. OTHER SYMPTOMS: Do you have any other symptoms? (e.g., fever, abdomen pain, burning with  urination, blood in urine)       no 11. PREGNANCY: Is there any chance you are pregnant? When was your last menstrual period?       Na   Pt stated possible history -Gastroesophageal reflux dx.  Protocols used: Back Pain-A-AH

## 2024-01-16 ENCOUNTER — Other Ambulatory Visit: Payer: Self-pay | Admitting: Family Medicine

## 2024-01-16 ENCOUNTER — Ambulatory Visit: Admitting: Family Medicine

## 2024-01-16 ENCOUNTER — Encounter: Payer: Self-pay | Admitting: Family Medicine

## 2024-01-16 VITALS — BP 124/72 | HR 85 | Temp 98.4°F | Ht 61.0 in | Wt 118.2 lb

## 2024-01-16 DIAGNOSIS — I10 Essential (primary) hypertension: Secondary | ICD-10-CM | POA: Diagnosis not present

## 2024-01-16 DIAGNOSIS — M5416 Radiculopathy, lumbar region: Secondary | ICD-10-CM | POA: Diagnosis not present

## 2024-01-16 DIAGNOSIS — R600 Localized edema: Secondary | ICD-10-CM

## 2024-01-16 DIAGNOSIS — F411 Generalized anxiety disorder: Secondary | ICD-10-CM | POA: Diagnosis not present

## 2024-01-16 MED ORDER — BETAMETHASONE SOD PHOS & ACET 6 (3-3) MG/ML IJ SUSP
6.0000 mg | Freq: Once | INTRAMUSCULAR | Status: AC
  Administered 2024-01-16: 6 mg via INTRAMUSCULAR

## 2024-01-16 MED ORDER — PREDNISONE 20 MG PO TABS
ORAL_TABLET | ORAL | 0 refills | Status: DC
Start: 2024-01-16 — End: 2024-03-10

## 2024-01-16 MED ORDER — KETOROLAC TROMETHAMINE 60 MG/2ML IM SOLN
60.0000 mg | Freq: Once | INTRAMUSCULAR | Status: AC
Start: 2024-01-16 — End: 2024-01-16
  Administered 2024-01-16: 60 mg via INTRAMUSCULAR

## 2024-01-16 NOTE — Progress Notes (Signed)
 Subjective:  Patient ID: [name], female    DOB: [birth year]  Age: 83 y.o. MRN: [id number]  CC: Back Pain (ONGOING BUT GETTING WORSE) and Leg Pain (ONGOING BUT GETTING WORSE)   HPI  History of Present Illness  Discussed the use of AI scribe software for clinical note transcription with the patient, who gave verbal consent to proceed.  History of Present Illness   [name] is an 83 year old female who presents with worsening pain from the waist down, primarily on the right side.  She experiences pain originating in the sacroiliac area, radiating down to her toes, affecting both the medial and lateral sides of her legs. The pain has been present for three to four months and is progressively worsening, with more severity on the right side. The pain is described as 'running up and down' and disrupts her sleep.  She takes pain medication, which helps her sleep but does not prevent the pain from waking her. She uses pregabalin  for pain management but does not take it consistently, only when the pain becomes severe.  She experiences anxiety attacks exacerbated by the pain, leading her to smoke cigarettes to calm her nerves. She is concerned about her reduced ability to walk due to fear of pain and cramping, impacting her daily activities.  For blood pressure management, she takes two medications: one for regular control and another for blood pressure and chest pain. She experiences chest pain in the evening, which subsides when she turns onto her side. She takes her blood pressure medication at 6 AM and notes that her blood pressure has been stable.              01/16/2024    4:24 PM 01/16/2024    4:14 PM 01/07/2024   12:05 PM  Depression screen PHQ 2/9  Decreased Interest 1 0 0  Down, Depressed, Hopeless 1 0 0  PHQ - 2 Score 2 0 0  Altered sleeping 1    Tired, decreased energy 1    Change in appetite 1    Feeling bad or failure about yourself  1    Trouble  concentrating 1    Moving slowly or fidgety/restless 1    Suicidal thoughts 1    PHQ-9 Score 9    Difficult doing work/chores Somewhat difficult      History [place] has a past medical history of Acute idiopathic pericarditis (07/15/2023), Agatston coronary artery calcium  score between 200 and 399 (07/15/2023), Arthritis, Atypical chest pain (03/19/2022), Back pain with sciatica (02/21/2021), CHF (congestive heart failure) (HCC), GERD (gastroesophageal reflux disease), Headache(784.0), Hepatitis, History of gout, Hypertension, Hypertensive encephalopathy, 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   She has a past surgical history that includes Abdominal hysterectomy; Anterior cervical corpectomy (03/07/2012); Tonsillectomy; Multiple tooth extractions; Cataract extraction w/PHACO (Left, 04/12/2017); Cataract extraction w/PHACO (Right, 05/03/2017); and LEFT HEART CATH AND CORONARY ANGIOGRAPHY (N/A, 07/11/2023).   Her family history includes Asthma in her daughter; Bipolar disorder in her daughter, daughter, daughter, daughter, and son; Drug abuse in her daughter; Heart disease in her daughter and daughter; Hypertension in her daughter. She was adopted.She reports that she quit smoking about 21 months ago. Her smoking use included cigarettes. She started smoking about 63 years ago. She has a 30.9 pack-year smoking history. She has been exposed to tobacco smoke. She has never used smokeless tobacco. She reports  that she does not currently use alcohol. She reports that she does not use drugs.    ROS Review of Systems  Constitutional: Negative.   HENT:  Negative for congestion.   Eyes:  Negative for visual disturbance.  Respiratory:  Negative for shortness of breath.   Cardiovascular:  Negative for chest pain.   Gastrointestinal:  Negative for abdominal pain, constipation, diarrhea, nausea and vomiting.  Genitourinary:  Negative for difficulty urinating.  Musculoskeletal:  Positive for arthralgias and back pain. Negative for myalgias.  Neurological:  Negative for headaches.  Psychiatric/Behavioral:  Negative for sleep disturbance.     Objective:  BP 124/72   Pulse 85   Temp 98.4 F (36.9 C)   Ht 5' 1 (1.549 m)   Wt 118 lb 3.2 oz (53.6 kg)   SpO2 96%   BMI 22.33 kg/m   BP Readings from Last 3 Encounters:  01/16/24 124/72  12/16/23 127/64  12/01/23 135/65    Wt Readings from Last 3 Encounters:  01/16/24 118 lb 3.2 oz (53.6 kg)  12/16/23 116 lb (52.6 kg)  12/02/23 119 lb (54 kg)     Physical Exam Constitutional:      General: She is not in acute distress.    Appearance: She is well-developed.  Cardiovascular:     Rate and Rhythm: Normal rate and regular rhythm.  Pulmonary:     Breath sounds: Normal breath sounds.  Musculoskeletal:        General: Normal range of motion.  Skin:    General: Skin is warm and dry.  Neurological:     Mental Status: She is alert and oriented to person, place, and time.      Assessment & Plan:  Lumbar radiculopathy -     Ketorolac  Tromethamine  -     Betamethasone  Sod Phos & Acet  Essential hypertension with goal blood pressure less than 130/80  Leg edema  GAD (generalized anxiety disorder)  Other orders -     predniSONE ; One twice daily with food for 2 weeks. Then one daily for 2 weeks  Dispense: 42 tablet; Refill: 0   Assessment and Plan    Sciatica with right-sided predominant pain and chronic pain syndrome Chronic pain for 3-4 months, primarily waist down, right-sided, consistent with sciatica. Severe pain affects daily activities and sleep. Current pregabalin  use inconsistent and ineffective. Increased cigarette use reported for nerve calming. - Prescribed prednisone  for 4 weeks: 2 tablets daily for 2 weeks, then 1 tablet  daily for 2 weeks. - Instructed to take pregabalin  daily for effective pain management. - Administered Celestone  1 mL and Toradol  60 mg injections. - Follow up in 1 month to assess pain management efficacy.  Left leg swelling Swelling in left leg more pronounced than right, with less associated pain.  Hypertension Blood pressure well-controlled with current medication regimen. Two medications taken, one also addresses chest pain.  Chest pain Intermittent chest pain when medication effect diminishes, alleviated by positional changes. Current regimen includes blood pressure medication addressing chest pain.  Anxiety disorder Anxiety exacerbated by chronic pain, leading to increased cigarette use for nerve calming. - Encouraged exploration of non-smoking methods to manage anxiety.        Assessment & Plan        Follow-up: No follow-ups on file.  Butler Der, M.D.

## 2024-02-04 ENCOUNTER — Other Ambulatory Visit: Payer: Self-pay | Admitting: *Deleted

## 2024-02-04 NOTE — Patient Outreach (Signed)
 Complex Care Management   Visit Note  02/04/2024  Name:  [name] MRN: [id number] DOB: [birth year]  Situation: Referral received for Complex Care Management related to COPD and HTN I obtained verbal consent from Patient.  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   Ileus (HCC) 05/15/2021   Influenza A 07/15/2023   NSTEMI (non-ST elevated myocardial infarction) (HCC) 07/10/2023   Stroke (HCC)    SLIGHT RT SIDE WEAKNESS 2001   Unintentional weight loss 03/19/2022    Assessment: Patient Reported Symptoms:  Cognitive Cognitive Status: No symptoms reported Cognitive/Intellectual Conditions Management [RPT]: None reported or documented in medical history or problem list   Health Maintenance Behaviors: Annual physical exam Healing Pattern: Average Health Facilitated by: Rest  Neurological Neurological Review of Symptoms: Dizziness Neurological Self-Management Outcome: 3 (uncertain) Neurological Comment: dizziness when bending over  HEENT HEENT Symptoms Reported: No symptoms reported HEENT Management Strategies: Routine screening HEENT Self-Management Outcome: 4 (good)    Cardiovascular Cardiovascular Symptoms Reported: No symptoms reported Does patient have uncontrolled Hypertension?: Yes Is patient checking Blood Pressure at home?: Yes Patient's Recent BP reading at home: 115/60 Cardiovascular Management Strategies: Routine  screening  Respiratory Respiratory Symptoms Reported: Shortness of breath, Dry cough Respiratory Self-Management Outcome: 4 (good)  Endocrine Endocrine Symptoms Reported: No symptoms reported Is patient diabetic?: No Endocrine Self-Management Outcome: 4 (good)  Gastrointestinal Gastrointestinal Symptoms Reported: No symptoms reported Gastrointestinal Self-Management Outcome: 4 (good) Nutrition Risk Screen (CP): No indicators present  Genitourinary Genitourinary Symptoms Reported: Frequency Genitourinary Management Strategies: Incontinence garment/pad Genitourinary Self-Management Outcome: 4 (good)  Integumentary Integumentary Symptoms Reported: No symptoms reported Skin Self-Management Outcome: 4 (good)  Musculoskeletal Musculoskelatal Symptoms Reviewed: Weakness Additional Musculoskeletal Details: right leg weakness/tingling Musculoskeletal Management Strategies: Medication therapy Musculoskeletal Self-Management Outcome: 4 (good) Falls in the past year?: Yes Number of falls in past year: 2 or more Was there an injury with Fall?: Yes Fall Risk Category Calculator: 3 Patient Fall Risk Level: High Fall Risk Patient at Risk for Falls Due to: History of fall(s) Fall risk Follow up: Falls evaluation completed, Education provided  Psychosocial Psychosocial Symptoms Reported: No symptoms reported          02/04/2024    PHQ2-9 Depression Screening   Little interest or pleasure in doing things Not at all  Feeling down, depressed, or hopeless Not at all  PHQ-2 - Total Score 0  Trouble falling or staying asleep, or sleeping too much    Feeling tired or having little energy    Poor appetite or overeating     Feeling bad about yourself - or that you are a failure or have let yourself or your family down    Trouble concentrating on things, such as reading the newspaper or watching television    Moving or speaking so slowly that other people could have noticed.  Or the opposite - being so  fidgety or  restless that you have been moving around a lot more than usual    Thoughts that you would be better off dead, or hurting yourself in some way    PHQ2-9 Total Score    If you checked off any problems, how difficult have these problems made it for you to do your work, take care of things at home, or get along with other people    Depression Interventions/Treatment      Vitals:   02/04/24 0944  BP: 115/60  Pulse: 80    Medications Reviewed Today     Reviewed by Bertrum Rosina HERO, RN (Registered Nurse) on 02/04/24 at [phone]  Med List Status: <None>   Medication Order Taking? Sig Documenting Provider Last Dose Status Informant  acetaminophen  (TYLENOL ) 500 MG tablet [id number] Yes Take 2 tablets (1,000 mg total) by mouth 3 (three) times daily. Zollie Lowers, MD  Active Self, Pharmacy Records  albuterol  (VENTOLIN  HFA) [phone] Base) MCG/ACT inhaler [id number]  INHALE 2 PUFFS INTO THE LUNGS EVERY 6 HOURS AS NEEDED FOR WHEEZE OR SHORTNESS OF BREATH  Patient not taking: Reported on 02/04/2024   Zollie Lowers, MD  Consider Medication Status and Discontinue   alendronate  (FOSAMAX ) 70 MG tablet [id number] Yes Take 1 tablet (70 mg total) by mouth every 7 (seven) days. Take with a full glass of water  on an empty stomach. Zollie Lowers, MD  Active Self, Pharmacy Records  amLODipine -olmesartan  (AZOR ) 10-40 MG tablet [id number] Yes Take 1 tablet by mouth daily. For blood pressure. Zollie Lowers, MD  Active   atorvastatin  (LIPITOR ) 80 MG tablet [id number] Yes Take 1 tablet (80 mg total) by mouth daily. Vannie Reche RAMAN, NP  Active Self, Pharmacy Records  cloNIDine  (CATAPRES ) 0.2 MG tablet [id number]  Take 1.5 tablets morning and evening, and 1 tablet mid-day.  Patient not taking: Reported on 02/04/2024   Raford Riggs, MD  Consider Medication Status and Discontinue   clopidogrel  (PLAVIX ) 75 MG tablet [id number] Yes Take 1 tablet (75 mg total) by mouth daily. Vannie Reche RAMAN, NP  Active Self,  Pharmacy Records  colchicine  0.6 MG tablet [id number]  Take 1 tablet (0.6 mg total) by mouth daily.  Patient not taking: Reported on 02/04/2024   Michele Richardson, DO  Expired 12/16/23 2359   fluticasone  furoate-vilanterol (BREO ELLIPTA ) 200-25 MCG/ACT AEPB [id number] Yes Inhale 1 puff into the lungs daily. Zollie Lowers, MD  Active   furosemide  (LASIX ) 20 MG tablet [id number] Yes TAKE 1 TABLET (20 MG TOTAL) BY MOUTH DAILY. FOR SWELLING Raford Riggs, MD  Active   hydrALAZINE  (APRESOLINE ) 100 MG tablet [id number]  Take 1 tablet (100 mg total) by mouth 3 (three) times daily.  Patient not taking: Reported on 02/04/2024   Vannie Reche RAMAN, NP  Expired 11/22/23 2359            Med Note [facility], [facility]   Mon Nov 04, 2023  9:26 AM) Patient states easing self off medicines. Not sure of last dose.   isosorbide  mononitrate (IMDUR ) 60 MG 24 hr tablet [id number] Yes TAKE 1 TABLET (60 MG TOTAL) BY MOUTH DAILY. FOR BLOOD PRESSURE AND TO PREVENT CHEST PAIN. Raford Riggs, MD  Active   Magnesium  250 MG TABS [id number] Yes Take 1 tablet (250 mg total) by mouth daily. Zollie Lowers, MD  Active Self, Pharmacy Records           Med Note [facility], [facility]   Mon Nov 04, 2023  9:27 AM) Per patient, takes q 2-3 days  neomycin -polymyxin-hydrocortisone (CORTISPORIN)  3.5-10000-1 ophthalmic suspension [id number] Yes Place 3 drops into both eyes 4 (four) times daily. Zollie Lowers, MD  Active   pantoprazole  (PROTONIX ) 40 MG tablet [id number]  TAKE ONE TABLET BY MOUTH DAILY  Patient not taking: Reported on 02/04/2024   Zollie Lowers, MD  Consider Medication Status and Discontinue   potassium chloride  SA (KLOR-CON  M) 20 MEQ tablet [id number] Yes Take 1 tablet (20 mEq total) by mouth daily. For potassium replacement/ supplement Zollie Lowers, MD  Active   predniSONE  (DELTASONE ) 20 MG tablet [id number] Yes One twice daily with food for 2 weeks. Then one daily for 2 weeks Zollie Lowers, MD  Active   pregabalin  (LYRICA ) 300 MG  capsule [id number] Yes Take 1 capsule (300 mg total) by mouth at bedtime. To reduce back pain Zollie Lowers, MD  Active   rOPINIRole  (REQUIP ) 1 MG tablet [id number] Yes TAKE 1 TABLET (1 MG TOTAL) BY MOUTH AT BEDTIME. FOR LEG CRAMPS Zollie Lowers, MD  Active   SUMAtriptan  (IMITREX ) 100 MG tablet [id number]  Take one at onset of HA. May repeat in 2 hours if headache persists or recurs. Limit two per 24 hours  Patient not taking: Reported on 02/04/2024   Zollie Lowers, MD  Consider Medication Status and Discontinue   tizanidine  (ZANAFLEX ) 2 MG capsule [id number] Yes Take 2 mg by mouth at bedtime. [provider]  Active   vitamin B-12 1000 MCG tablet [id number]  Take 1 tablet (1,000 mcg total) by mouth daily.  Patient not taking: Reported on 02/04/2024   Vicci Afton CROME, MD  Consider Medication Status and Discontinue Self, Pharmacy Records  Vitamin D , Ergocalciferol , (DRISDOL ) 1.25 MG (50000 UNIT) CAPS capsule [id number]  Take 1 capsule (50,000 Units total) by mouth every 7 (seven) days.  Patient not taking: Reported on 02/04/2024   Zollie Lowers, MD  Consider Medication Status and Discontinue Self, Pharmacy Records           Med Note [facility] [place], [place] A   Wed Jul 10, 2023  6:14 PM)              Recommendation:   Continue Current Plan of Care  Follow Up Plan:   Telephone follow-up in 1 month  Rosina Forte, BSN RN [facility], [facility] Health RN Care Manager Direct Dial: [phone]  Fax: [phone]

## 2024-02-04 NOTE — Patient Instructions (Signed)
 Visit Information  Thank you for taking time to visit with me today. Please don't hesitate to contact me if I can be of assistance to you before our next scheduled appointment.  Your next care management appointment is by telephone on 03-03-2024 at 9:00 am  Telephone follow-up in 1 month  Please call the care guide team at [phone] if you need to cancel, schedule, or reschedule an appointment.   Please call the Suicide and Crisis Lifeline: 988 call the [place]  National Suicide Prevention Lifeline: 3397579063 or TTY: (219)160-6699 TTY 573-659-1359) to talk to a trained counselor call 1-800-273-TALK (toll free, 24 hour hotline) call the [facility]: 219-845-6942 call 911 if you are experiencing a Mental Health or Behavioral Health Crisis or need someone to talk to.  Rosina Forte, BSN RN [facility], [facility] Health RN Care Manager Direct Dial: [phone]  Fax: [phone]

## 2024-02-18 ENCOUNTER — Ambulatory Visit: Admitting: Family Medicine

## 2024-02-24 ENCOUNTER — Other Ambulatory Visit: Payer: Self-pay | Admitting: Family Medicine

## 2024-02-24 DIAGNOSIS — I10 Essential (primary) hypertension: Secondary | ICD-10-CM

## 2024-02-25 ENCOUNTER — Ambulatory Visit (INDEPENDENT_AMBULATORY_CARE_PROVIDER_SITE_OTHER): Admitting: Family Medicine

## 2024-02-25 ENCOUNTER — Encounter: Payer: Self-pay | Admitting: Family Medicine

## 2024-02-25 VITALS — BP 148/75 | HR 71 | Temp 98.7°F | Ht 61.0 in | Wt 117.0 lb

## 2024-02-25 DIAGNOSIS — J4489 Other specified chronic obstructive pulmonary disease: Secondary | ICD-10-CM | POA: Diagnosis not present

## 2024-02-25 DIAGNOSIS — M5416 Radiculopathy, lumbar region: Secondary | ICD-10-CM | POA: Diagnosis not present

## 2024-02-25 DIAGNOSIS — M8580 Other specified disorders of bone density and structure, unspecified site: Secondary | ICD-10-CM | POA: Diagnosis not present

## 2024-02-25 DIAGNOSIS — G959 Disease of spinal cord, unspecified: Secondary | ICD-10-CM | POA: Diagnosis not present

## 2024-02-25 MED ORDER — PREGABALIN 300 MG PO CAPS: 300.0000 mg | ORAL_CAPSULE | Freq: Every day | ORAL | 3 refills | Status: DC

## 2024-02-25 MED ORDER — ALENDRONATE SODIUM 70 MG PO TABS: 70.0000 mg | ORAL_TABLET | ORAL | 3 refills | Status: AC

## 2024-02-25 MED ORDER — CLOPIDOGREL BISULFATE 75 MG PO TABS: 75.0000 mg | ORAL_TABLET | Freq: Every day | ORAL | 3 refills | Status: DC

## 2024-02-25 NOTE — Progress Notes (Signed)
 Subjective:  Patient ID: [name], female    DOB: 1940/08/28  Age: 83 y.o. MRN: [id number]  CC: Follow-up (1 mnth follow up /Sciatica/Back pain/)   HPI  Discussed the use of AI scribe software for clinical note transcription with the patient, who gave verbal consent to proceed.  History of Present Illness [name] is an 83 year old female with a history of stroke and COPD who presents with right-sided weakness and pain.  She experiences significant pain and weakness on her right side, which has been progressively worsening. She has difficulty walking and often requires the use of a cane for balance. The pain sometimes becomes excruciating, particularly in the Achilles tendon area, and it wakes her up at night.  She reports dropping objects with her right hand due to a lack of sensation, describing it as 'no feeling or something' and noting that her right hand side 'is going to hell'.  She mentions a knot at the base of her neck on the right side, which swells and becomes painful, especially when the pain is about to increase. This knot is located in the soft part of her neck behind the clavicle.  Her past medical history includes a stroke early last year, which initially affected her left side. She is confused as to why her right side is now problematic, despite the left side of her face drooping during the stroke.  She is currently taking pregabalin  300 mg at bedtime for back pain and has completed a course of prednisone . She also uses inhalers for COPD management, which she reports as being effective.  Her social history includes challenges with transportation, as she relies on public transport and faces difficulties on weekends when buses do not run. She prefers [employer] for medication refills due to their delivery service, which alleviates the need for costly transportation.          02/25/2024    4:04 PM 02/04/2024    9:50 AM 01/16/2024    4:24 PM   Depression screen PHQ 2/9  Decreased Interest 1 0 1  Down, Depressed, Hopeless 1 0 1  PHQ - 2 Score 2 0 2  Altered sleeping 1  1  Tired, decreased energy 1  1  Change in appetite 1  1  Feeling bad or failure about yourself  1  1  Trouble concentrating 1  1  Moving slowly or fidgety/restless 1  1  Suicidal thoughts 0  1  PHQ-9 Score 8  9  Difficult doing work/chores Somewhat difficult  Somewhat difficult    History [name] has a past medical history of Acute idiopathic pericarditis (07/15/2023), Agatston coronary artery calcium  score between 200 and 399 (07/15/2023), Arthritis, Atypical chest pain (03/19/2022), Back pain with sciatica (02/21/2021), CHF (congestive heart failure) (HCC), GERD (gastroesophageal reflux disease), Headache(784.0), Hepatitis, History of gout, Hypertension, Hypertensive encephalopathy, 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   She has a past surgical history that includes Abdominal hysterectomy; Anterior cervical corpectomy (03/07/2012); Tonsillectomy; Multiple tooth extractions; Cataract extraction w/PHACO (Left, 04/12/2017); Cataract extraction w/PHACO (Right, 05/03/2017); and LEFT HEART CATH AND CORONARY ANGIOGRAPHY (N/A, 07/11/2023).   Her family history includes Asthma in her daughter; Bipolar disorder in her daughter, daughter, daughter, daughter, and son; Drug abuse in her daughter; Heart disease in her daughter and daughter; Hypertension in her daughter. She was adopted.She reports that she quit smoking  about 23 months ago. Her smoking use included cigarettes. She started smoking about 63 years ago. She has a 30.9 pack-year smoking history. She has been exposed to tobacco smoke. She has never used smokeless tobacco. She reports that she does not currently use alcohol. She  reports that she does not use drugs.    ROS Review of Systems  Objective:  BP (!) 148/75   Pulse 71   Temp 98.7 F (37.1 C)   Ht 5' 1 (1.549 m)   Wt 117 lb (53.1 kg)   SpO2 96%   BMI 22.11 kg/m   BP Readings from Last 3 Encounters:  02/25/24 (!) 148/75  02/04/24 115/60  01/16/24 124/72    Wt Readings from Last 3 Encounters:  02/25/24 117 lb (53.1 kg)  01/16/24 118 lb 3.2 oz (53.6 kg)  12/16/23 116 lb (52.6 kg)     Physical Exam Physical Exam VITALS: BP- 146/80 GENERAL: Alert, cooperative, well developed, no acute distress. HEENT: Normocephalic, normal oropharynx, moist mucous membranes. NECK: Knot at the base of the neck on the right side. CHEST: Clear to auscultation bilaterally, no wheezes, rhonchi, or crackles. CARDIOVASCULAR: Normal heart rate and rhythm, S1 and S2 normal without murmurs. ABDOMEN: Soft, non-tender, non-distended, without organomegaly, normal bowel sounds. EXTREMITIES: No cyanosis or edema. RUE relatively weak compared to left especially at grip MUSCULOSKELETAL: Right hand grip strength reduced. NEUROLOGICAL: Cranial nerves grossly intact, moves all extremities without gross motor or sensory deficit. Phalen test is equivocal.    Assessment & Plan:  Lumbar radiculopathy -     Pregabalin ; Take 1 capsule (300 mg total) by mouth at bedtime. To reduce back pain  Dispense: 90 capsule; Refill: 3  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HCC) -     Ambulatory referral to Neurology -     Pregabalin ; Take 1 capsule (300 mg total) by mouth at bedtime. To reduce back pain  Dispense: 90 capsule; Refill: 3  COPD with chronic bronchitis (HCC)  Osteopenia, unspecified location -     Alendronate  Sodium; Take 1 tablet (70 mg total) by mouth every 7 (seven) days. Take with a full glass of water  on an empty stomach.  Dispense: 13 tablet; Refill: 3  Other orders -     Clopidogrel  Bisulfate; Take 1 tablet (75 mg total) by mouth daily.  Dispense: 90 tablet; Refill:  3    Assessment and Plan Assessment & Plan Right-sided upper extremity weakness and numbness   She experiences weakness and numbness in her right upper extremity, with difficulty gripping objects and frequent dropping of items. The weakness is more pronounced than on the left side, despite being right-handed. There is concern for a pinched or damaged nerve, possibly related to a previous stroke or a new neurological issue. Refer to neurology for further evaluation. Continue clopidogrel  and ensure refill at [facility].  Lumbar radiculopathy with chronic right-sided pain   Chronic right-sided pain persists, affecting her mobility and requiring the use of a cane for balance. Continue pregabalin  300 mg at bedtime for pain management. Renew pregabalin  prescription at [facility].  Essential hypertension   Her blood pressure is well-controlled at 146/80 mmHg with the current medication regimen.  COPD   Her COPD is well-managed with inhalers. No current respiratory issues are reported.  Elevated LDL cholesterol   Her LDL cholesterol is mildly elevated. Schedule follow-up for LDL cholesterol recheck in six months.       Follow-up: Return in about 3 months (around 05/26/2024).  Butler Der, M.D.

## 2024-02-27 ENCOUNTER — Encounter: Payer: Self-pay | Admitting: Neurology

## 2024-03-03 ENCOUNTER — Encounter: Payer: Self-pay | Admitting: Family

## 2024-03-03 ENCOUNTER — Telehealth: Admitting: *Deleted

## 2024-03-04 ENCOUNTER — Ambulatory Visit (HOSPITAL_COMMUNITY): Admitting: Psychiatry

## 2024-03-08 ENCOUNTER — Other Ambulatory Visit: Payer: Self-pay

## 2024-03-08 ENCOUNTER — Emergency Department (HOSPITAL_COMMUNITY)

## 2024-03-08 ENCOUNTER — Encounter (HOSPITAL_COMMUNITY): Payer: Self-pay

## 2024-03-08 ENCOUNTER — Inpatient Hospital Stay (HOSPITAL_COMMUNITY)
Admission: EM | Admit: 2024-03-08 | Discharge: 2024-03-13 | DRG: 438 | Disposition: A | Discharge status: Home / Self Care | Attending: Family Medicine | Admitting: Family Medicine

## 2024-03-08 DIAGNOSIS — J4489 Other specified chronic obstructive pulmonary disease: Secondary | ICD-10-CM | POA: Diagnosis present

## 2024-03-08 DIAGNOSIS — Z8673 Personal history of transient ischemic attack (TIA), and cerebral infarction without residual deficits: Secondary | ICD-10-CM

## 2024-03-08 DIAGNOSIS — K8500 Idiopathic acute pancreatitis without necrosis or infection: Secondary | ICD-10-CM | POA: Diagnosis not present

## 2024-03-08 DIAGNOSIS — K859 Acute pancreatitis without necrosis or infection, unspecified: Secondary | ICD-10-CM | POA: Diagnosis present

## 2024-03-08 DIAGNOSIS — J9601 Acute respiratory failure with hypoxia: Secondary | ICD-10-CM | POA: Diagnosis present

## 2024-03-08 DIAGNOSIS — Z720 Tobacco use: Secondary | ICD-10-CM | POA: Diagnosis present

## 2024-03-08 DIAGNOSIS — I10 Essential (primary) hypertension: Secondary | ICD-10-CM | POA: Diagnosis not present

## 2024-03-08 DIAGNOSIS — R079 Chest pain, unspecified: Secondary | ICD-10-CM | POA: Diagnosis not present

## 2024-03-08 DIAGNOSIS — Z7983 Long term (current) use of bisphosphonates: Secondary | ICD-10-CM

## 2024-03-08 DIAGNOSIS — E870 Hyperosmolality and hypernatremia: Secondary | ICD-10-CM | POA: Diagnosis not present

## 2024-03-08 DIAGNOSIS — E876 Hypokalemia: Secondary | ICD-10-CM | POA: Diagnosis present

## 2024-03-08 DIAGNOSIS — D696 Thrombocytopenia, unspecified: Secondary | ICD-10-CM | POA: Diagnosis not present

## 2024-03-08 DIAGNOSIS — Z7902 Long term (current) use of antithrombotics/antiplatelets: Secondary | ICD-10-CM

## 2024-03-08 DIAGNOSIS — N179 Acute kidney failure, unspecified: Secondary | ICD-10-CM | POA: Diagnosis not present

## 2024-03-08 DIAGNOSIS — K219 Gastro-esophageal reflux disease without esophagitis: Secondary | ICD-10-CM | POA: Diagnosis present

## 2024-03-08 DIAGNOSIS — E785 Hyperlipidemia, unspecified: Secondary | ICD-10-CM | POA: Diagnosis not present

## 2024-03-08 DIAGNOSIS — K8591 Acute pancreatitis with uninfected necrosis, unspecified: Secondary | ICD-10-CM | POA: Diagnosis not present

## 2024-03-08 DIAGNOSIS — Z825 Family history of asthma and other chronic lower respiratory diseases: Secondary | ICD-10-CM

## 2024-03-08 DIAGNOSIS — K861 Other chronic pancreatitis: Secondary | ICD-10-CM | POA: Diagnosis present

## 2024-03-08 DIAGNOSIS — I130 Hypertensive heart and chronic kidney disease with heart failure and stage 1 through stage 4 chronic kidney disease, or unspecified chronic kidney disease: Secondary | ICD-10-CM | POA: Diagnosis present

## 2024-03-08 DIAGNOSIS — Z961 Presence of intraocular lens: Secondary | ICD-10-CM | POA: Diagnosis present

## 2024-03-08 DIAGNOSIS — I2510 Atherosclerotic heart disease of native coronary artery without angina pectoris: Secondary | ICD-10-CM | POA: Diagnosis present

## 2024-03-08 DIAGNOSIS — Z79899 Other long term (current) drug therapy: Secondary | ICD-10-CM | POA: Diagnosis not present

## 2024-03-08 DIAGNOSIS — Z9842 Cataract extraction status, left eye: Secondary | ICD-10-CM

## 2024-03-08 DIAGNOSIS — I700 Atherosclerosis of aorta: Secondary | ICD-10-CM | POA: Diagnosis not present

## 2024-03-08 DIAGNOSIS — G2581 Restless legs syndrome: Secondary | ICD-10-CM | POA: Diagnosis present

## 2024-03-08 DIAGNOSIS — Z8249 Family history of ischemic heart disease and other diseases of the circulatory system: Secondary | ICD-10-CM | POA: Diagnosis not present

## 2024-03-08 DIAGNOSIS — R1111 Vomiting without nausea: Secondary | ICD-10-CM | POA: Diagnosis not present

## 2024-03-08 DIAGNOSIS — I252 Old myocardial infarction: Secondary | ICD-10-CM | POA: Diagnosis not present

## 2024-03-08 DIAGNOSIS — K449 Diaphragmatic hernia without obstruction or gangrene: Secondary | ICD-10-CM | POA: Diagnosis not present

## 2024-03-08 DIAGNOSIS — K6389 Other specified diseases of intestine: Secondary | ICD-10-CM | POA: Diagnosis not present

## 2024-03-08 DIAGNOSIS — N1832 Chronic kidney disease, stage 3b: Secondary | ICD-10-CM | POA: Diagnosis not present

## 2024-03-08 DIAGNOSIS — Z90710 Acquired absence of both cervix and uterus: Secondary | ICD-10-CM | POA: Diagnosis not present

## 2024-03-08 DIAGNOSIS — R109 Unspecified abdominal pain: Secondary | ICD-10-CM | POA: Diagnosis not present

## 2024-03-08 DIAGNOSIS — F17200 Nicotine dependence, unspecified, uncomplicated: Secondary | ICD-10-CM | POA: Diagnosis present

## 2024-03-08 DIAGNOSIS — Z9841 Cataract extraction status, right eye: Secondary | ICD-10-CM

## 2024-03-08 DIAGNOSIS — Z880 Allergy status to penicillin: Secondary | ICD-10-CM

## 2024-03-08 DIAGNOSIS — I5032 Chronic diastolic (congestive) heart failure: Secondary | ICD-10-CM | POA: Diagnosis present

## 2024-03-08 DIAGNOSIS — J441 Chronic obstructive pulmonary disease with (acute) exacerbation: Secondary | ICD-10-CM

## 2024-03-08 DIAGNOSIS — R918 Other nonspecific abnormal finding of lung field: Secondary | ICD-10-CM | POA: Diagnosis not present

## 2024-03-08 LAB — URINALYSIS, ROUTINE W REFLEX MICROSCOPIC
Bacteria, UA: NONE SEEN
Bilirubin Urine: NEGATIVE
Glucose, UA: NEGATIVE mg/dL
Ketones, ur: NEGATIVE mg/dL
Leukocytes,Ua: NEGATIVE
Nitrite: NEGATIVE
Protein, ur: 100 mg/dL — AB
Specific Gravity, Urine: >1.046 — ABNORMAL HIGH (ref 1.005–1.030)
pH: 5 (ref 5.0–8.0)

## 2024-03-08 LAB — CBC
HCT: 48.7 % — ABNORMAL HIGH (ref 36.0–46.0)
Hemoglobin: 15.3 g/dL — ABNORMAL HIGH (ref 12.0–15.0)
MCH: 30.7 pg (ref 26.0–34.0)
MCHC: 31.4 g/dL (ref 30.0–36.0)
MCV: 97.6 fL (ref 80.0–100.0)
Platelets: 108 K/uL — ABNORMAL LOW (ref 150–400)
RBC: 4.99 MIL/uL (ref 3.87–5.11)
RDW: 13.4 % (ref 11.5–15.5)
WBC: 12.6 K/uL — ABNORMAL HIGH (ref 4.0–10.5)
nRBC: 0 % (ref 0.0–0.2)

## 2024-03-08 LAB — COMPREHENSIVE METABOLIC PANEL WITH GFR
ALT: <5 U/L (ref 0–44)
AST: 16 U/L (ref 15–41)
Albumin: 4 g/dL (ref 3.5–5.0)
Alkaline Phosphatase: 66 U/L (ref 38–126)
Anion gap: 11 (ref 5–15)
BUN: 18 mg/dL (ref 8–23)
CO2: 28 mmol/L (ref 22–32)
Calcium: 9.2 mg/dL (ref 8.9–10.3)
Chloride: 106 mmol/L (ref 98–111)
Creatinine, Ser: 1.3 mg/dL — ABNORMAL HIGH (ref 0.44–1.00)
GFR, Estimated: 41 mL/min — ABNORMAL LOW (ref >60)
Glucose, Bld: 128 mg/dL — ABNORMAL HIGH (ref 70–99)
Potassium: 3.9 mmol/L (ref 3.5–5.1)
Sodium: 145 mmol/L (ref 135–145)
Total Bilirubin: 1 mg/dL (ref 0.0–1.2)
Total Protein: 6.6 g/dL (ref 6.5–8.1)

## 2024-03-08 LAB — PROTIME-INR
INR: 0.9 (ref 0.8–1.2)
Prothrombin Time: 12.1 s (ref 11.4–15.2)

## 2024-03-08 LAB — TROPONIN T, HIGH SENSITIVITY
Troponin T High Sensitivity: <15 ng/L (ref 0–19)
Troponin T High Sensitivity: <15 ng/L (ref 0–19)

## 2024-03-08 LAB — LIPASE, BLOOD: Lipase: 1992 U/L — ABNORMAL HIGH (ref 11–51)

## 2024-03-08 MED ORDER — ALBUTEROL SULFATE HFA 108 (90 BASE) MCG/ACT IN AERS: 2.0000 | INHALATION_SPRAY | RESPIRATORY_TRACT | Status: DC | PRN

## 2024-03-08 MED ORDER — LACTATED RINGERS IV SOLN: INTRAVENOUS | Status: DC

## 2024-03-08 MED ORDER — ALBUTEROL SULFATE (2.5 MG/3ML) 0.083% IN NEBU: 2.5000 mg | INHALATION_SOLUTION | RESPIRATORY_TRACT | Status: DC | PRN

## 2024-03-08 MED ORDER — PROCHLORPERAZINE EDISYLATE 10 MG/2ML IJ SOLN: 10.0000 mg | Freq: Four times a day (QID) | INTRAMUSCULAR | Status: DC | PRN

## 2024-03-08 MED ORDER — ACETAMINOPHEN 650 MG RE SUPP: 650.0000 mg | Freq: Four times a day (QID) | RECTAL | Status: DC | PRN

## 2024-03-08 MED ORDER — FLUTICASONE FUROATE-VILANTEROL 200-25 MCG/ACT IN AEPB
1.0000 | INHALATION_SPRAY | Freq: Every day | RESPIRATORY_TRACT | Status: AC
Start: 2024-03-08 — End: ?
  Administered 2024-03-08 – 2024-03-13 (×6): 1 via RESPIRATORY_TRACT
  Filled 2024-03-08: qty 28

## 2024-03-08 MED ORDER — SODIUM CHLORIDE 0.9 % IV BOLUS
1000.0000 mL | Freq: Once | INTRAVENOUS | Status: AC
  Administered 2024-03-08: 1000 mL via INTRAVENOUS

## 2024-03-08 MED ORDER — ACETAMINOPHEN 325 MG PO TABS
650.0000 mg | ORAL_TABLET | Freq: Four times a day (QID) | ORAL | Status: DC | PRN
  Administered 2024-03-10 – 2024-03-12 (×6): 650 mg via ORAL
  Filled 2024-03-08 (×6): qty 2

## 2024-03-08 MED ORDER — ONDANSETRON HCL 4 MG/2ML IJ SOLN
4.0000 mg | Freq: Once | INTRAMUSCULAR | Status: AC
  Administered 2024-03-08: 4 mg via INTRAVENOUS
  Filled 2024-03-08: qty 2

## 2024-03-08 MED ORDER — IOHEXOL 300 MG/ML  SOLN
80.0000 mL | Freq: Once | INTRAMUSCULAR | Status: AC | PRN
  Administered 2024-03-08: 80 mL via INTRAVENOUS

## 2024-03-08 MED ORDER — MORPHINE SULFATE (PF) 2 MG/ML IV SOLN
2.0000 mg | INTRAVENOUS | Status: AC | PRN
Start: 2024-03-08 — End: ?
  Administered 2024-03-08 – 2024-03-09 (×5): 4 mg via INTRAVENOUS
  Filled 2024-03-08 (×5): qty 2

## 2024-03-08 MED ORDER — ONDANSETRON HCL 4 MG/2ML IJ SOLN: 4.0000 mg | Freq: Four times a day (QID) | INTRAMUSCULAR | Status: DC | PRN

## 2024-03-08 MED ORDER — HEPARIN SODIUM (PORCINE) 5000 UNIT/ML IJ SOLN
5000.0000 [IU] | Freq: Three times a day (TID) | INTRAMUSCULAR | Status: DC
  Administered 2024-03-08 – 2024-03-13 (×14): 5000 [IU] via SUBCUTANEOUS
  Filled 2024-03-08 (×14): qty 1

## 2024-03-08 MED ORDER — CLOPIDOGREL BISULFATE 75 MG PO TABS
75.0000 mg | ORAL_TABLET | Freq: Every day | ORAL | Status: DC
  Administered 2024-03-08 – 2024-03-13 (×6): 75 mg via ORAL
  Filled 2024-03-08 (×6): qty 1

## 2024-03-08 MED ORDER — HYDRALAZINE HCL 20 MG/ML IJ SOLN: 10.0000 mg | INTRAMUSCULAR | Status: DC | PRN

## 2024-03-08 MED ORDER — PANTOPRAZOLE SODIUM 40 MG IV SOLR
40.0000 mg | Freq: Every day | INTRAVENOUS | Status: DC
  Administered 2024-03-08 – 2024-03-13 (×6): 40 mg via INTRAVENOUS
  Filled 2024-03-08 (×6): qty 10

## 2024-03-08 MED ORDER — MORPHINE SULFATE (PF) 4 MG/ML IV SOLN
4.0000 mg | Freq: Once | INTRAVENOUS | Status: AC
  Administered 2024-03-08: 4 mg via INTRAVENOUS
  Filled 2024-03-08: qty 1

## 2024-03-08 NOTE — H&P (Signed)
 History and Physical    Patient: [name] FMW:[id number] DOB: November 08, 1940 DOA: 03/08/2024 DOS: the patient was seen and examined on 03/08/2024 PCP: Zollie Lowers, MD  Patient coming from: Home  Chief Complaint:  Chief Complaint  Patient presents with   Chest Pain   Abdominal Pain   HPI: [name] is an 83 y.o. female with a history of CVA on plavix , HLD, chronic HFpEF, HTN, nonobstructive CAD, COPD with ongoing occasional tobacco use, RLS, and GERD who presented to the ED with abdominal and chest pain. She reports development of moderate abdominal pain starting at the bottom of the belly, radiating all the way to her chest, also over the next 24 hours started radiating bilaterally from her upper abdomen to her back. This worsened overall, was always worse when taking anything by mouth, so stopped eating/drinking. +nausea, a single episode of stomach-content emesis. When she woke up this morning, the pain was severe (like she was in labor), so she called EMS. She denies any change with exertion or dyspnea.   No history of pancreatitis or diabetes, no FH either. No recent fever, chills, unintentional weight loss. No new medications. No hx gallstones. She drinks a beer a couple times a month, smokes occasionally, no illicit drugs (with one exception where she took a gummy and never took it again because my dog and cat started speaking [place].)    Work up in the ED included normal troponins, mild leukocytosis without fever, and lipase elevated to 1,992 with otherwise normal LFTs. CT abd/pelvis confirms acute pancreatitis. IVF, IV zofran , IV morphine  given and admission requested. In the ED she was put on supplemental oxygen but denies dyspnea. Also took some ice for dry mouth which caused significant worsening of epigastric pain.  Review of Systems: As mentioned in the history of present illness. All other systems reviewed and are negative. 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   Ileus (HCC) 05/15/2021   Influenza A 07/15/2023   NSTEMI (non-ST elevated myocardial infarction) (HCC) 07/10/2023   Stroke (HCC)    SLIGHT RT SIDE WEAKNESS 2001   Unintentional weight loss 03/19/2022   Past Surgical History:  Procedure Laterality Date   ABDOMINAL HYSTERECTOMY     ANTERIOR CERVICAL CORPECTOMY  03/07/2012   Procedure: ANTERIOR CERVICAL CORPECTOMY;  Surgeon: Victory Gens, MD;  Location: [facility];  Service: Neurosurgery;  Laterality: N/A;  Cervical six-seven, cervical seven-thoracic one Anterior cervical decompression/diskectomy/fusion, with Cervical seven Corpectomy, reconstruction using Allograft and Alphatec plate   CATARACT EXTRACTION W/PHACO Left 04/12/2017   Procedure: CATARACT EXTRACTION PHACO AND INTRAOCULAR LENS PLACEMENT (IOC);  Surgeon: Harrie Agent, MD;  Location: [facility];  Service: Ophthalmology;  Laterality: Left;  CDE: 3.82   CATARACT EXTRACTION W/PHACO Right 05/03/2017   Procedure: CATARACT EXTRACTION PHACO AND INTRAOCULAR LENS PLACEMENT RIGHT EYE;  Surgeon: Harrie Agent, MD;  Location: [facility];  Service: Ophthalmology;  Laterality: Right;  CDE: 3.89   LEFT HEART  CATH AND CORONARY ANGIOGRAPHY N/A 07/11/2023   Procedure: LEFT HEART CATH AND CORONARY ANGIOGRAPHY;  Surgeon: Elmira Newman PARAS, MD;  Location: [facility];  Service: Cardiovascular;  Laterality: N/A;   MULTIPLE TOOTH EXTRACTIONS     TONSILLECTOMY     Social History:  reports that she quit  smoking about 1 years ago. Her smoking use included cigarettes. She started smoking about 63 years ago. She has a 30.9 pack-year smoking history. She has been exposed to tobacco smoke. She has never used smokeless tobacco. She reports that she does not currently use alcohol. She reports that she does not use drugs.  Allergies  Allergen Reactions   Penicillins Anaphylaxis, Swelling, Rash and Other (See Comments)    Has patient had a PCN reaction causing immediate rash, facial/tongue/throat swelling, SOB or lightheadedness with hypotension: yes Has patient had a PCN reaction causing severe rash involving mucus membranes or skin necrosis: no Has patient had a PCN reaction that required hospitalization: yes Has patient had a PCN reaction occurring within the last 10 years: no If all of the above answers are NO, then may proceed with Cephalosporin use.     Family History  Adopted: Yes  Problem Relation Age of Onset   Bipolar disorder Daughter    Heart disease Daughter    Asthma Daughter    Bipolar disorder Son    Bipolar disorder Daughter    Bipolar disorder Daughter    Bipolar disorder Daughter    Hypertension Daughter    Heart disease Daughter    Drug abuse Daughter        OD    Prior to Admission medications   Medication Sig Start Date End Date Taking? Authorizing Provider  acetaminophen  (TYLENOL ) 500 MG tablet Take 2 tablets (1,000 mg total) by mouth 3 (three) times daily. 10/30/22   Zollie Lowers, MD  albuterol  (VENTOLIN  HFA) 108 (90 Base) MCG/ACT inhaler INHALE 2 PUFFS INTO THE LUNGS EVERY 6 HOURS AS NEEDED FOR WHEEZE OR SHORTNESS OF BREATH 10/08/23   Zollie Lowers, MD  alendronate  (FOSAMAX ) 70 MG tablet Take 1 tablet (70 mg total) by mouth every 7 (seven) days. Take with a full glass of water  on an empty stomach. 02/25/24   Zollie Lowers, MD  amLODipine -olmesartan  (AZOR ) 10-40 MG tablet TAKE 1 TABLET BY MOUTH DAILY FOR BLOOD PRESSURE 02/24/24   Zollie Lowers, MD  atorvastatin   (LIPITOR ) 80 MG tablet Take 1 tablet (80 mg total) by mouth daily. 06/11/23   Walker, Caitlin S, NP  cloNIDine  (CATAPRES ) 0.2 MG tablet Take 1.5 tablets morning and evening, and 1 tablet mid-day. 10/29/23   Raford Riggs, MD  clopidogrel  (PLAVIX ) 75 MG tablet Take 1 tablet (75 mg total) by mouth daily. 02/25/24   Zollie Lowers, MD  fluticasone  furoate-vilanterol (BREO ELLIPTA ) 200-25 MCG/ACT AEPB Inhale 1 puff into the lungs daily. 12/16/23   Zollie Lowers, MD  furosemide  (LASIX ) 20 MG tablet TAKE 1 TABLET (20 MG TOTAL) BY MOUTH DAILY. FOR SWELLING 12/09/23   Raford Riggs, MD  hydrALAZINE  (APRESOLINE ) 100 MG tablet Take 1 tablet (100 mg total) by mouth 3 (three) times daily. 08/20/23 02/25/24  Walker, Caitlin S, NP  isosorbide  mononitrate (IMDUR ) 60 MG 24 hr tablet TAKE 1 TABLET (60 MG TOTAL) BY MOUTH DAILY. FOR BLOOD PRESSURE AND TO PREVENT CHEST PAIN. 12/09/23   Raford Riggs, MD  Magnesium  250 MG TABS Take 1 tablet (250 mg total) by mouth daily. 06/18/23   Zollie Lowers, MD  neomycin -polymyxin-hydrocortisone (CORTISPORIN) 3.5-10000-1 ophthalmic suspension  Place 3 drops into both eyes 4 (four) times daily. 12/16/23   Zollie Lowers, MD  pantoprazole  (PROTONIX ) 40 MG tablet TAKE ONE TABLET BY MOUTH DAILY 01/17/24   Zollie Lowers, MD  potassium chloride  SA (KLOR-CON  M) 20 MEQ tablet Take 1 tablet (20 mEq total) by mouth daily. For potassium replacement/ supplement 12/16/23   Zollie Lowers, MD  predniSONE  (DELTASONE ) 20 MG tablet One twice daily with food for 2 weeks. Then one daily for 2 weeks 01/16/24   Zollie Lowers, MD  pregabalin  (LYRICA ) 300 MG capsule Take 1 capsule (300 mg total) by mouth at bedtime. To reduce back pain 02/25/24 02/19/25  Zollie Lowers, MD  rOPINIRole  (REQUIP ) 1 MG tablet TAKE 1 TABLET (1 MG TOTAL) BY MOUTH AT BEDTIME. FOR LEG CRAMPS 09/16/23   Zollie Lowers, MD  SUMAtriptan  (IMITREX ) 100 MG tablet Take one at onset of HA. May repeat in 2 hours if headache persists or  recurs. Limit two per 24 hours 12/16/23   Zollie Lowers, MD  tizanidine  (ZANAFLEX ) 2 MG capsule Take 2 mg by mouth at bedtime.    [provider]  vitamin B-12 1000 MCG tablet Take 1 tablet (1,000 mcg total) by mouth daily. 09/21/22   Johnson, Clanford L, MD  Vitamin D , Ergocalciferol , (DRISDOL ) 1.25 MG (50000 UNIT) CAPS capsule Take 1 capsule (50,000 Units total) by mouth every 7 (seven) days. 10/31/22   Zollie Lowers, MD    Physical Exam: Vitals:   03/08/24 1145 03/08/24 1200 03/08/24 1215 03/08/24 1250  BP: (!) 168/85 (!) 160/40 105/83 (!) 149/75  Pulse: 76 74 77 79  Resp: 20 13    Temp:    98.9 F (37.2 C)  TempSrc:    Oral  SpO2: 97% 99% 98% 100%  Weight:    52.6 kg  Height:    5' 1 (1.549 m)  Gen: Elderly but well-appearing female in no distress Pulm: Clear, nonlabored. She's on 2L O2 but no wheezes or crackles, nonlabored, slightly diminished  CV: RRR, no MRG or pitting edema. GI: Soft, significant epigastric tenderness without rigidity, +BS Neuro: Alert and oriented. No new focal deficits. Subtle left sided weakness.  Ext: Warm, no deformities. Skin: No rashes, lesions or ulcers on visualized skin   Data Reviewed: WBC 12.6k, plt 108k (not new), hgb 15.3g/dl SCr 1.3 (near baseline), BUN 18, glucose 128. CMP otherwise normal.  Lipase 1,992 Troponin < 15 x2 CXR (personal interpretation): Platelike atelectasis at right base. No consolidation or effusions. CT abd/pelvis: Inflammation and edema of pancreas (which has calcifications at the [facility]), mildly dilated main pancreatic duct 4mm. Mesenteric edema and free fluid without focal collection. Left renal scarring, renal cysts without hydronephrosis. GB normal, no stones, Liver with scattered small hypodensities, unchanged cysts.   Assessment and Plan: Acute pancreatitis: With surrounding edema and free fluid but no necrosis or pseudocyst. HOP calcifications interesting as she has no hx similar symptoms. A CT April 2024  showed normal pancreas. 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 No gallstones, no significant EtOH history, trig's normal. There is pancreatic duct dilation in this 83yo with first episode of pancreatitis and long smoking history, we will need to evaluate for malignancy.  - IVF reordered, LR at 100cc/hr given her hx HFpEF, got 1L NS already.  - Strict NPO, can use swabs for dry mouth.  - IV zofran  prn N/V, IV compazine prn refractory N/V - IV morphine  2-4mg  prn mod-severe pain - Check IgG4  Acute hypoxic respiratory  failure: Suspect mild atelectasis on fairly significant COPD. No wheezing or pneumonia or significant edema/effusions.  - Wean oxygen, aim for SpO2 89-95%.  - Further work up if unable to wean O2.   COPD: PFTs 2023 with FEV 58%, FVC 77%, ratio 74%, TLC 129%, DLCO 68%. No reversibility. Still smoking - Continue controller and prn albuterol .   Tobacco use:  - Cessation counseling provided, nicotine  patch declined.  - Suspected reason for mild polycythemia in addition to hemoconcentration.   History of CVA, HLD:  - Continue plavix  if able to tolerate (minimizing po burden). Will hold statin. Note pt declined infusion therapy (despite LDL 119 earlier this year).   HTN, chronic HFpEF: Nonobstructive CAD by [facility] Feb 2025, Tn negative x2 here.   - BP normotensive, will plan formal med rec, may need to use clonidine  patch to avoid rebound, but otherwise planning IV Tx for now.   Stage IIIb CKD: Near baseline.  - Avoid nephrotoxins  RLS:  - No IV equivalent for lyrica , requip . Will order these prn once confirmed.   GERD:  - PPI IV daily  Leukocytosis, thrombocytopenia: Suspect both are reactive to primary process above.  - Will monitor.   Advance Care Planning: Full code  Consults: None  Family Communication: None  Severity of Illness: The appropriate patient status for this patient is INPATIENT. Inpatient status is judged to be reasonable and  necessary in order to provide the required intensity of service to ensure the patient's safety. The patient's presenting symptoms, physical exam findings, and initial radiographic and laboratory data in the context of their chronic comorbidities is felt to place them at high risk for further clinical deterioration. Furthermore, it is not anticipated that the patient will be medically stable for discharge from the hospital within 2 midnights of admission.   * I certify that at the point of admission it is my clinical judgment that the patient will require inpatient hospital care spanning beyond 2 midnights from the point of admission due to high intensity of service, high risk for further deterioration and high frequency of surveillance required.*  Author: Bernardino KATHEE Come, MD 03/08/2024 1:04 PM  For on call review [email or web address].

## 2024-03-08 NOTE — ED Triage Notes (Signed)
 Patient BIB [facility] from home for complaint of chest pain that started yesturday and vomitied once. Informed EMS upper center abdomen now hurting and radiating toward chest.

## 2024-03-08 NOTE — Plan of Care (Signed)
  Problem: Education: Goal: Knowledge of General Education information will improve Description: Including pain rating scale, medication(s)/side effects and non-pharmacologic comfort measures Outcome: Progressing   Problem: Clinical Measurements: Goal: Ability to maintain clinical measurements within normal limits will improve Outcome: Progressing   Problem: Health Behavior/Discharge Planning: Goal: Ability to manage health-related needs will improve Outcome: Progressing   Problem: Clinical Measurements: Goal: Diagnostic test results will improve Outcome: Progressing   Problem: Activity: Goal: Risk for activity intolerance will decrease Outcome: Progressing

## 2024-03-08 NOTE — ED Provider Notes (Signed)
 [place] EMERGENCY DEPARTMENT AT [facility] Provider Note   CSN: [id number] Arrival date & time: 03/08/24  0845     Patient presents with: Chest Pain and Abdominal Pain   [name] is a 83 y.o. female.  She is brought in by ambulance from home complaining of generalized abdominal pain radiating up into her chest that started yesterday.  Rates it as 8 out of 10.  Never had before.  Associated with 2 episodes of nonbloody vomiting.  No diarrhea or constipation no urinary symptoms fevers or chills.  No shortness of breath.  She has tried nothing for symptoms.   The history is provided by the patient.  Abdominal Pain Pain location:  Generalized Pain quality: aching   Pain radiates to:  Back and chest Pain severity:  Severe Onset quality:  Gradual Duration:  2 days Timing:  Constant Progression:  Worsening Chronicity:  New Relieved by:  Nothing Associated symptoms: chest pain, nausea and vomiting   Associated symptoms: no chills, no constipation, no cough, no diarrhea, no dysuria, no fever, no hematemesis, no hematochezia, no hematuria and no shortness of breath        Prior to Admission medications   Medication Sig Start Date End Date Taking? Authorizing Provider  acetaminophen  (TYLENOL ) 500 MG tablet Take 2 tablets (1,000 mg total) by mouth 3 (three) times daily. 10/30/22   Zollie Lowers, MD  albuterol  (VENTOLIN  HFA) 108 (90 Base) MCG/ACT inhaler INHALE 2 PUFFS INTO THE LUNGS EVERY 6 HOURS AS NEEDED FOR WHEEZE OR SHORTNESS OF BREATH 10/08/23   Zollie Lowers, MD  alendronate  (FOSAMAX ) 70 MG tablet Take 1 tablet (70 mg total) by mouth every 7 (seven) days. Take with a full glass of water  on an empty stomach. 02/25/24   Zollie Lowers, MD  amLODipine -olmesartan  (AZOR ) 10-40 MG tablet TAKE 1 TABLET BY MOUTH DAILY FOR BLOOD PRESSURE 02/24/24   Zollie Lowers, MD  atorvastatin  (LIPITOR ) 80 MG tablet Take 1 tablet (80 mg total) by mouth daily. 06/11/23   Walker, Caitlin  S, NP  cloNIDine  (CATAPRES ) 0.2 MG tablet Take 1.5 tablets morning and evening, and 1 tablet mid-day. 10/29/23   Raford Riggs, MD  clopidogrel  (PLAVIX ) 75 MG tablet Take 1 tablet (75 mg total) by mouth daily. 02/25/24   Zollie Lowers, MD  fluticasone  furoate-vilanterol (BREO ELLIPTA ) 200-25 MCG/ACT AEPB Inhale 1 puff into the lungs daily. 12/16/23   Zollie Lowers, MD  furosemide  (LASIX ) 20 MG tablet TAKE 1 TABLET (20 MG TOTAL) BY MOUTH DAILY. FOR SWELLING 12/09/23   Raford Riggs, MD  hydrALAZINE  (APRESOLINE ) 100 MG tablet Take 1 tablet (100 mg total) by mouth 3 (three) times daily. 08/20/23 02/25/24  Walker, Caitlin S, NP  isosorbide  mononitrate (IMDUR ) 60 MG 24 hr tablet TAKE 1 TABLET (60 MG TOTAL) BY MOUTH DAILY. FOR BLOOD PRESSURE AND TO PREVENT CHEST PAIN. 12/09/23   Raford Riggs, MD  Magnesium  250 MG TABS Take 1 tablet (250 mg total) by mouth daily. 06/18/23   Zollie Lowers, MD  neomycin -polymyxin-hydrocortisone (CORTISPORIN) 3.5-10000-1 ophthalmic suspension Place 3 drops into both eyes 4 (four) times daily. 12/16/23   Zollie Lowers, MD  pantoprazole  (PROTONIX ) 40 MG tablet TAKE ONE TABLET BY MOUTH DAILY 01/17/24   Zollie Lowers, MD  potassium chloride  SA (KLOR-CON  M) 20 MEQ tablet Take 1 tablet (20 mEq total) by mouth daily. For potassium replacement/ supplement 12/16/23   Zollie Lowers, MD  predniSONE  (DELTASONE ) 20 MG tablet One twice daily with food for 2 weeks. Then one  daily for 2 weeks 01/16/24   Zollie Lowers, MD  pregabalin  (LYRICA ) 300 MG capsule Take 1 capsule (300 mg total) by mouth at bedtime. To reduce back pain 02/25/24 02/19/25  Zollie Lowers, MD  rOPINIRole  (REQUIP ) 1 MG tablet TAKE 1 TABLET (1 MG TOTAL) BY MOUTH AT BEDTIME. FOR LEG CRAMPS 09/16/23   Zollie Lowers, MD  SUMAtriptan  (IMITREX ) 100 MG tablet Take one at onset of HA. May repeat in 2 hours if headache persists or recurs. Limit two per 24 hours 12/16/23   Zollie Lowers, MD  tizanidine  (ZANAFLEX ) 2 MG capsule  Take 2 mg by mouth at bedtime.    [provider]  vitamin B-12 1000 MCG tablet Take 1 tablet (1,000 mcg total) by mouth daily. 09/21/22   Johnson, Clanford L, MD  Vitamin D , Ergocalciferol , (DRISDOL ) 1.25 MG (50000 UNIT) CAPS capsule Take 1 capsule (50,000 Units total) by mouth every 7 (seven) days. 10/31/22   Zollie Lowers, MD    Allergies: Penicillins    Review of Systems  Constitutional:  Negative for chills and fever.  Respiratory:  Negative for cough and shortness of breath.   Cardiovascular:  Positive for chest pain.  Gastrointestinal:  Positive for abdominal pain, nausea and vomiting. Negative for constipation, diarrhea, hematemesis and hematochezia.  Genitourinary:  Negative for dysuria and hematuria.    Updated Vital Signs BP (!) 164/94   Pulse 78   Temp 98.7 F (37.1 C) (Oral)   Resp 19   Ht 5' 1 (1.549 m)   Wt 51.7 kg   SpO2 93%   BMI 21.54 kg/m   Physical Exam Vitals and nursing note reviewed.  Constitutional:      General: She is not in acute distress.    Appearance: She is well-developed.  HENT:     Head: Normocephalic and atraumatic.  Eyes:     Conjunctiva/sclera: Conjunctivae normal.  Cardiovascular:     Rate and Rhythm: Normal rate and regular rhythm.     Heart sounds: Normal heart sounds. No murmur heard. Pulmonary:     Effort: Pulmonary effort is normal. No respiratory distress.     Breath sounds: Normal breath sounds.  Abdominal:     Palpations: Abdomen is soft.     Tenderness: There is generalized abdominal tenderness. There is no guarding or rebound.  Musculoskeletal:     Cervical back: Neck supple.     Right lower leg: No edema.     Left lower leg: No edema.  Skin:    General: Skin is warm and dry.     Capillary Refill: Capillary refill takes less than 2 seconds.  Neurological:     General: No focal deficit present.     Mental Status: She is alert.     (all labs ordered are listed, but only abnormal results are displayed) Labs  Reviewed  CBC - Abnormal; Notable for the following components:      Result Value   WBC 12.6 (*)    Hemoglobin 15.3 (*)    HCT 48.7 (*)    Platelets 108 (*)    All other components within normal limits  COMPREHENSIVE METABOLIC PANEL WITH GFR - Abnormal; Notable for the following components:   Glucose, Bld 128 (*)    Creatinine, Ser 1.30 (*)    GFR, Estimated 41 (*)    All other components within normal limits  LIPASE, BLOOD - Abnormal; Notable for the following components:   Lipase 1,992 (*)    All other components within normal limits  PROTIME-INR  URINALYSIS, ROUTINE W REFLEX MICROSCOPIC  IGG 4  TROPONIN T, HIGH SENSITIVITY  TROPONIN T, HIGH SENSITIVITY    EKG: None  Radiology: CT ABDOMEN PELVIS W CONTRAST Result Date: 03/08/2024 EXAM: CT ABDOMEN AND PELVIS WITH CONTRAST 03/08/2024 11:07:40 AM TECHNIQUE: CT of the abdomen and pelvis was performed with the administration of 80 mL of iohexol  (OMNIPAQUE ) 300 MG/ML solution. Multiplanar reformatted images are provided for review. Automated exposure control, iterative reconstruction, and/or weight-based adjustment of the mA/kV was utilized to reduce the radiation dose to as low as reasonably achievable. COMPARISON: None available. CLINICAL HISTORY: Abdominal pain, acute, nonlocalized; lipase elevated. Abd pain; Lipase elevated; Omnipaque  300; 80 cc ; BIB [facility] from home for complaint of chest pain that started yesturday and vomitied once. Informed EMS upper center abdomen now hurting and radiating toward chest. FINDINGS: LOWER CHEST: No acute abnormality. LIVER: There are a few scattered hypodensities, less than 1 cm, too small to characterize. There are also several cysts which appear unchanged from the prior exam which measure up to 1.2 cm. GALLBLADDER AND BILE DUCTS: Gallbladder is unremarkable. No biliary ductal dilatation. SPLEEN: No acute abnormality. PANCREAS: Calcifications within the head of the pancreas are identified, compatible  with changes due to chronic pancreatitis. The main pancreatic duct is mildly dilated at the level of the pancreatic neck and head, measuring up to 4 mm, image 26/2. Diffuse pancreatic edema with peripancreatic fat stranding is noted. Peripancreatic fluid extends into the lesser sac, along bilateral pericolic gutters, and into the pelvis. Mild perisplenic and perihepatic fluid noted. Edema extends into the central mesenteric fat. No signs of pancreatic necrosis or pseudocyst formation. ADRENAL GLANDS: Normal size and morphology bilaterally. No nodule, thickening, or hemorrhage. No periadrenal stranding. KIDNEYS, URETERS AND BLADDER: No stones in the kidneys or ureters. No hydronephrosis. No perinephric or periureteral stranding. Bosniak class 1 cyst of the posterior right kidney measures 1.4 cm. Bosniak class 1 left parapelvic cyst is also noted. These require no further follow-up. Left renal cortical scarring. Urinary bladder is unremarkable. GI AND BOWEL: Mild gastric distention. Small hiatal hernia with a small amount of fluid extending into the hernia sac. Redundant transverse colon. A few scattered loops of mild wall thickening of the transverse and descending colon is identified, nonspecific and possibly due to incomplete distention. The sigmoid colon demonstrates diverticulosis without evidence of diverticulitis. The appendix is visualized and appears normal. No pathologic dilatation of the large or small bowel loops. PERITONEUM AND RETROPERITONEUM: Peripancreatic fluid extends into the lesser sac, along bilateral pericolic gutters over the right lobe of liver and into the pelvis. Edema is noted within the central mesentery. No signs of pneumoperitoneum. No focal fluid collections. VASCULATURE: Aorta is normal in caliber. Extensive aortic atherosclerotic calcifications. Abdominal vascularity appears patent. LYMPH NODES: No lymphadenopathy. REPRODUCTIVE ORGANS: Status post hysterectomy. No adnexal mass. BONES  AND SOFT TISSUES: No acute osseous abnormality. No focal soft tissue abnormality. IMPRESSION: 1. Acute pancreatitis with diffuse pancreatic edema and extensive peripancreatic fluid tracking into the lesser sac, pericolic gutters, and pelvis. No pancreatic necrosis or pseudocyst. 2. Chronic pancreatitis with calcifications in the pancreatic head and mild main pancreatic duct dilation (up to 4 mm). 3. Aortic atherosclerotic calcification. Electronically signed by: Waddell Calk MD 03/08/2024 11:48 AM EDT RP Workstation: [id number]   DG Chest Port 1 View Result Date: 03/08/2024 CLINICAL DATA:  cp EXAM: PORTABLE CHEST 1 VIEW COMPARISON:  July 10, 2023 FINDINGS: The cardiomediastinal silhouette is unchanged in contour.Atherosclerotic calcifications of the aorta. No  pleural effusion. No pneumothorax. New band like opacity of the RIGHT apex. IMPRESSION: 1. New band like opacity of the RIGHT apex. This likely reflects atelectasis but underlying infection could reflect similarly. 2. Followup PA and lateral chest X-ray is recommended in 4-6 weeks to ensure resolution and exclude underlying malignancy. Electronically Signed   By: Corean Salter M.D.   On: 03/08/2024 09:30     Procedures   Medications Ordered in the ED  morphine  (PF) 2 MG/ML injection 2-4 mg (4 mg Intravenous Given 03/08/24 1345)  ondansetron  (ZOFRAN ) injection 4 mg (has no administration in time range)  prochlorperazine (COMPAZINE) injection 10 mg (has no administration in time range)  lactated ringers  infusion ( Intravenous New Bag/Given 03/08/24 1354)  pantoprazole  (PROTONIX ) injection 40 mg (40 mg Intravenous Given 03/08/24 1615)  clopidogrel  (PLAVIX ) tablet 75 mg (75 mg Oral Given 03/08/24 1614)  fluticasone  furoate-vilanterol (BREO ELLIPTA ) 200-25 MCG/ACT 1 puff (1 puff Inhalation Given 03/08/24 1503)  heparin  injection 5,000 Units (has no administration in time range)  acetaminophen  (TYLENOL ) tablet 650 mg (has no  administration in time range)    Or  acetaminophen  (TYLENOL ) suppository 650 mg (has no administration in time range)  hydrALAZINE  (APRESOLINE ) injection 10 mg (has no administration in time range)  albuterol  (PROVENTIL ) (2.5 MG/3ML) 0.083% nebulizer solution 2.5 mg (has no administration in time range)  sodium chloride  0.9 % bolus 1,000 mL (0 mLs Intravenous Stopped 03/08/24 1241)  ondansetron  (ZOFRAN ) injection 4 mg (4 mg Intravenous Given 03/08/24 0915)  morphine  (PF) 4 MG/ML injection 4 mg (4 mg Intravenous Given 03/08/24 0915)  iohexol  (OMNIPAQUE ) 300 MG/ML solution 80 mL (80 mLs Intravenous Contrast Given 03/08/24 1055)    Clinical Course as of 03/08/24 1650  Sun Mar 08, 2024  0913 EKG not crossing into epic.  Normal sinus rhythm rate of 80 no acute ST-T's.  [MB]  0931 Chest x-ray interpreted by me as no acute infiltrate.  Awaiting radiology reading. [MB]  1114 Patient's lipase markedly elevated.  LFTs are otherwise normal.  She denies any frequent alcohol intake. [MB]  1202 Discussed with [facility] hospitalist Dr. Bryn who will evaluate patient for admission. [MB]    Clinical Course User Index [MB] Towana Ozell BROCKS, MD                                 Medical Decision Making Amount and/or Complexity of Data Reviewed Labs: ordered. Radiology: ordered.  Risk OTC drugs. Prescription drug management. Decision regarding hospitalization.   This patient complains of generalized abdominal pain radiating to the chest along with nausea and vomiting; this involves an extensive number of treatment Options and is a complaint that carries with it a high risk of complications and morbidity. The differential includes perforation, pancreatitis, peptic ulcer disease, ACS, diverticulitis  I ordered, reviewed and interpreted labs, which included CBC with elevated white count low platelets, chemistries with mildly elevated creatinine, lipase significantly elevated, troponin flat I ordered  medication IV fluids pain medicine nausea medicine and reviewed PMP when indicated. I ordered imaging studies which included chest x-ray, CT abdomen and pelvis and I independently    visualized and interpreted imaging which showed acute pancreatitis Previous records obtained and reviewed in epic including recent outpatient PCP notes I consulted Dr. Bryn [facility] hospitalist and discussed lab and imaging findings and discussed disposition.  Cardiac monitoring reviewed, sinus rhythm Social determinants considered, stress Critical Interventions: None  After the interventions stated  above, I reevaluated the patient and found patient to be having less pain but does not feel back to baseline Admission and further testing considered, she would benefit from mission the hospital for further management.  She is in agreement with plan for admission.      Final diagnoses:  Idiopathic acute pancreatitis without infection or necrosis    ED Discharge Orders     None          Towana Ozell BROCKS, MD 03/08/24 1652

## 2024-03-09 ENCOUNTER — Other Ambulatory Visit: Payer: Self-pay | Admitting: Family Medicine

## 2024-03-09 DIAGNOSIS — K859 Acute pancreatitis without necrosis or infection, unspecified: Secondary | ICD-10-CM | POA: Diagnosis not present

## 2024-03-09 LAB — CBC
HCT: 43.5 % (ref 36.0–46.0)
Hemoglobin: 13.6 g/dL (ref 12.0–15.0)
MCH: 30.5 pg (ref 26.0–34.0)
MCHC: 31.3 g/dL (ref 30.0–36.0)
MCV: 97.5 fL (ref 80.0–100.0)
Platelets: 86 K/uL — ABNORMAL LOW (ref 150–400)
RBC: 4.46 MIL/uL (ref 3.87–5.11)
RDW: 13.5 % (ref 11.5–15.5)
WBC: 14 K/uL — ABNORMAL HIGH (ref 4.0–10.5)
nRBC: 0 % (ref 0.0–0.2)

## 2024-03-09 LAB — COMPREHENSIVE METABOLIC PANEL WITH GFR
ALT: <5 U/L (ref 0–44)
AST: 15 U/L (ref 15–41)
Albumin: 3.3 g/dL — ABNORMAL LOW (ref 3.5–5.0)
Alkaline Phosphatase: 54 U/L (ref 38–126)
Anion gap: 11 (ref 5–15)
BUN: 18 mg/dL (ref 8–23)
CO2: 28 mmol/L (ref 22–32)
Calcium: 8.5 mg/dL — ABNORMAL LOW (ref 8.9–10.3)
Chloride: 109 mmol/L (ref 98–111)
Creatinine, Ser: 1.13 mg/dL — ABNORMAL HIGH (ref 0.44–1.00)
GFR, Estimated: 49 mL/min — ABNORMAL LOW (ref >60)
Glucose, Bld: 73 mg/dL (ref 70–99)
Potassium: 3.5 mmol/L (ref 3.5–5.1)
Sodium: 148 mmol/L — ABNORMAL HIGH (ref 135–145)
Total Bilirubin: 0.7 mg/dL (ref 0.0–1.2)
Total Protein: 5.6 g/dL — ABNORMAL LOW (ref 6.5–8.1)

## 2024-03-09 MED ORDER — IRBESARTAN 150 MG PO TABS
300.0000 mg | ORAL_TABLET | Freq: Every day | ORAL | Status: AC
Start: 2024-03-09 — End: ?
  Administered 2024-03-09 – 2024-03-13 (×5): 300 mg via ORAL
  Filled 2024-03-09 (×5): qty 2

## 2024-03-09 MED ORDER — AMLODIPINE BESYLATE 5 MG PO TABS
10.0000 mg | ORAL_TABLET | Freq: Every day | ORAL | Status: DC
  Administered 2024-03-09 – 2024-03-13 (×5): 10 mg via ORAL
  Filled 2024-03-09 (×6): qty 2

## 2024-03-09 MED ORDER — DEXTROSE-SODIUM CHLORIDE 5-0.45 % IV SOLN: INTRAVENOUS | Status: DC

## 2024-03-09 MED ORDER — AMLODIPINE-OLMESARTAN 10-40 MG PO TABS: 1.0000 | ORAL_TABLET | Freq: Every day | ORAL | Status: DC

## 2024-03-09 MED ORDER — HYDROXYZINE HCL 25 MG PO TABS
25.0000 mg | ORAL_TABLET | Freq: Three times a day (TID) | ORAL | Status: DC | PRN
  Administered 2024-03-09: 25 mg via ORAL
  Filled 2024-03-09: qty 1

## 2024-03-09 NOTE — Progress Notes (Signed)
 [facility] PROGRESS NOTE  [name] (DOB: March 06, 1941) FMW:[id number] PCP: Zollie Lowers, MD  Brief Narrative: [name] is an 83 y.o. female with a history of CVA on plavix , HLD, chronic HFpEF, HTN, nonobstructive CAD, COPD with ongoing occasional tobacco use, RLS, and GERD who presented to the ED 10/ 12 with abdominal and chest pain. Work up in the ED included normal troponins, mild leukocytosis without fever, and lipase elevated to 1,992 with otherwise normal LFTs. CT abd/pelvis confirms acute pancreatitis for which she was admitted.   Subjective: Soreness in abdomen is stable, when asked if she's hungry she says yes and no meaning she does want to eat but is sure it will worsen her pain.   Objective: BP (!) 159/68 (BP Location: Right Arm)   Pulse 95   Temp 98.6 F (37 C) (Oral)   Resp 19   Ht 5' 1 (1.549 m)   Wt 52.6 kg   SpO2 93%   BMI 21.91 kg/m   Gen: No distress Pulm: Clear, nonlabored  CV: RRR, no MRG, borderline tachycardia.  GI: Soft, +tenderness without rebound, about stable from yesterday.  Neuro: Alert and oriented. No new focal deficits. Ext: Warm, no deformities. Skin: No rashes, lesions or ulcers on visualized skin   Assessment & Plan: Acute pancreatitis: With surrounding edema and free fluid but no necrosis or pseudocyst. HOP calcifications interesting as she has no hx similar symptoms. A CT April 2024 showed normal pancreas. 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 No gallstones, no significant EtOH history, trig's normal. There is pancreatic duct dilation in this 83yo with first episode of pancreatitis and long smoking history, we will need to evaluate for malignancy.  - Continue IVF, can trial noncaloric liquids today as tolerated.  - IV zofran  prn N/V, IV compazine prn refractory N/V - IV morphine  2-4mg  prn mod-severe pain. Still regularly requiring this - Check IgG4 (pending)   Acute hypoxic respiratory  failure: Suspect mild atelectasis on fairly significant COPD. No wheezing or pneumonia or significant edema/effusions.  - Weaned quickly. Continue IS   COPD: PFTs 2023 with FEV 58%, FVC 77%, ratio 74%, TLC 129%, DLCO 68%. No reversibility. Still smoking - Continue controller and prn albuterol .    Tobacco use:  - Cessation counseling provided, nicotine  patch declined.  - Suspected reason for mild polycythemia in addition to hemoconcentration.    History of CVA, HLD:  - Continue plavix  if able to tolerate (minimizing po burden). Will hold statin. Note pt declined infusion therapy (despite LDL 119 earlier this year).    HTN, chronic HFpEF: Nonobstructive CAD by [facility] Feb 2025, Tn negative x2 here.   - BP up, will plan to trial po meds.    Stage IIIb CKD: Near baseline.  - Avoid nephrotoxins   RLS:  - No IV equivalent for lyrica , requip . Will order these prn once confirmed.    GERD:  - PPI IV daily   Leukocytosis, thrombocytopenia: Suspect both are reactive to primary process above.  - Will monitor.   Bernardino KATHEE Come, MD [facility] [email or web address] 03/09/2024, 3:37 PM

## 2024-03-09 NOTE — Progress Notes (Signed)
 Nurse at bedside, patient confused to place,time,and situation,oriented to person. Patient was yelling out in room for a friend she thought she heard in the hallway.Patient was reoriented.and assisted back to bed. Dr Bernardino Come notified. Plan of care on going.

## 2024-03-09 NOTE — Plan of Care (Signed)
   Problem: Education: Goal: Knowledge of General Education information will improve Description Including pain rating scale, medication(s)/side effects and non-pharmacologic comfort measures Outcome: Progressing   Problem: Education: Goal: Knowledge of General Education information will improve Description Including pain rating scale, medication(s)/side effects and non-pharmacologic comfort measures Outcome: Progressing

## 2024-03-09 NOTE — TOC CM/SW Note (Signed)
 Transition of Care [facility]) - Inpatient Brief Assessment   Patient Details  Name: [name] MRN: [id number] Date of Birth: [birth year]  [facility] of [facility]) CM/SW Contact:    Lucie Lunger, LCSWA Phone Number: 03/09/2024, 9:32 AM   Clinical Narrative: Transition of Care Department [facility]) has reviewed patient and no TOC needs have been identified at this time. We will continue to monitor patient advancement through interdiciplinary progression rounds. If new patient transition needs arise, please place a TOC consult.  Transition of Care Asessment: Insurance and Status: Insurance coverage has been reviewed Patient has primary care physician: Yes Home environment has been reviewed: From home Prior level of function:: Independent Prior/Current Home Services: No current home services Social Drivers of Health Review: SDOH reviewed no interventions necessary Readmission risk has been reviewed: Yes Transition of care needs: no transition of care needs at this time

## 2024-03-09 NOTE — ED Notes (Signed)
 Nurse at bedside,patient alert and oriented times four this morning.Patient did c/o abdominal pain rated a 8,a dull constant pain.Plan of care on going.

## 2024-03-09 NOTE — Progress Notes (Addendum)
Nurse at bedside

## 2024-03-09 NOTE — Progress Notes (Addendum)
 Nurse at bedside,patient was up at the sink when I walked in,patient had no oxygen on, patient was a little confused,was reoriented. Room air saturation was 75 percent,patient was assisted back to bed, oxygen was reapplied oxygen at 2 liters via nasal canula,oxygen saturation now was at 87 then it came up to 97 percent. Blood pressure to right arm was 74/70 heart rate 39, rechecked blood pressure to left arm 148/52,heart rate 85,Dr Bernardino Come notified, orders received to go ahead and give  Avapro  and Norvasc . Plan of care on going.Call bell in reach.

## 2024-03-09 NOTE — Plan of Care (Signed)

## 2024-03-10 DIAGNOSIS — K859 Acute pancreatitis without necrosis or infection, unspecified: Secondary | ICD-10-CM | POA: Diagnosis not present

## 2024-03-10 LAB — BASIC METABOLIC PANEL WITH GFR
Anion gap: 6 (ref 5–15)
BUN: 11 mg/dL (ref 8–23)
CO2: 29 mmol/L (ref 22–32)
Calcium: 8.5 mg/dL — ABNORMAL LOW (ref 8.9–10.3)
Chloride: 106 mmol/L (ref 98–111)
Creatinine, Ser: 0.94 mg/dL (ref 0.44–1.00)
GFR, Estimated: >60 mL/min (ref >60)
Glucose, Bld: 101 mg/dL — ABNORMAL HIGH (ref 70–99)
Potassium: 3.3 mmol/L — ABNORMAL LOW (ref 3.5–5.1)
Sodium: 141 mmol/L (ref 135–145)

## 2024-03-10 LAB — CBC
HCT: 43 % (ref 36.0–46.0)
Hemoglobin: 13.6 g/dL (ref 12.0–15.0)
MCH: 30.8 pg (ref 26.0–34.0)
MCHC: 31.6 g/dL (ref 30.0–36.0)
MCV: 97.3 fL (ref 80.0–100.0)
Platelets: 71 K/uL — ABNORMAL LOW (ref 150–400)
RBC: 4.42 MIL/uL (ref 3.87–5.11)
RDW: 13.2 % (ref 11.5–15.5)
WBC: 11.8 K/uL — ABNORMAL HIGH (ref 4.0–10.5)
nRBC: 0 % (ref 0.0–0.2)

## 2024-03-10 LAB — IGG 4: IgG, Subclass 4: 16 mg/dL (ref 2–96)

## 2024-03-10 MED ORDER — HYDROCERIN EX CREA
TOPICAL_CREAM | Freq: Two times a day (BID) | CUTANEOUS | Status: DC
  Filled 2024-03-10: qty 106

## 2024-03-10 MED ORDER — KCL IN DEXTROSE-NACL 20-5-0.45 MEQ/L-%-% IV SOLN: INTRAVENOUS | Status: AC

## 2024-03-10 NOTE — Progress Notes (Signed)
 Nurse at bedside,patient alert and oriented to person,place,and situation this am ,confused to time.Patient assisted to and from bathroom times one stand by assist.Plan of care on going.

## 2024-03-10 NOTE — Plan of Care (Signed)
   Problem: Education: Goal: Knowledge of General Education information will improve Description Including pain rating scale, medication(s)/side effects and non-pharmacologic comfort measures Outcome: Progressing   Problem: Education: Goal: Knowledge of General Education information will improve Description Including pain rating scale, medication(s)/side effects and non-pharmacologic comfort measures Outcome: Progressing

## 2024-03-10 NOTE — Progress Notes (Signed)
 [facility] PROGRESS NOTE  LECIA Cisneros (DOB: 08-23-1940) FMW:[id number] PCP: Zollie Lowers, MD  Brief Narrative: [name] is an 83 y.o. female with a history of CVA on plavix , HLD, chronic HFpEF, HTN, nonobstructive CAD, COPD with ongoing occasional tobacco use, RLS, and GERD who presented to the ED 10/ 12 with abdominal and chest pain. Work up in the ED included normal troponins, mild leukocytosis without fever, and lipase elevated to 1,992 with otherwise normal LFTs. CT abd/pelvis confirms acute pancreatitis for which she was admitted.   Subjective: Abdominal pain went away for a while, but came back when moving around this morning, hasn't taken anything po, doesn't want to. Water  still causes some discomfort. Got hydroxyzine low dose yesterday for itching related to dry skin (denies hives/rash) and started thinking she'd have to walk through a refrigerator to get to the bathroom (which she now knows is the closet)... No confusion since stopping that med.   Objective: BP (!) 157/62 (BP Location: Left Arm)   Pulse 77   Temp 98.9 F (37.2 C) (Oral)   Resp 17   Ht 5' 1 (1.549 m)   Wt 52.6 kg   SpO2 100%   BMI 21.91 kg/m   Gen: No distress. Pt is elderly but WDWN, active Pulm: Clear, nonlabored  CV: RRR, no MRG or edema GI: Soft, stable epigastric tenderness without rebound, +BS Neuro: Alert and oriented. No new focal deficits. Ext: Warm, no deformities Skin: No rashes, lesions or ulcers on visualized skin   Assessment & Plan: Acute pancreatitis: With surrounding edema and free fluid but no necrosis or pseudocyst. HOP calcifications interesting as she has no hx similar symptoms. A CT April 2024 showed normal pancreas. 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 No gallstones, no significant EtOH history, trig's normal. There is pancreatic duct dilation in this 83yo with first episode of pancreatitis and long smoking history, we will need to  evaluate for malignancy.  - Continue IVF, can trial noncaloric liquids today as tolerated.  - If not progressing, may need to consider NG feeds - IV zofran  prn N/V, IV compazine prn refractory N/V - IV morphine  2-4mg  prn mod-severe pain. Still regularly requiring this. Morphine  possibly causing body-wide itching, pt ok with keeping this, but could change to fentanyl  if becomes a problem. - IgG4 is still in process   Acute hypoxic respiratory failure: Suspect mild atelectasis on fairly significant COPD. No wheezing or pneumonia or significant edema/effusions.  - Weaned quickly. Continue IS   COPD: PFTs 2023 with FEV 58%, FVC 77%, ratio 74%, TLC 129%, DLCO 68%. No reversibility. Still smoking - Continue controller and prn albuterol .    Tobacco use:  - Cessation counseling provided, nicotine  patch declined.  - Suspected reason for mild polycythemia in addition to hemoconcentration.    History of CVA, HLD:  - Continue plavix  if able to tolerate (minimizing po burden). Will hold statin. Note pt declined infusion therapy (despite LDL 119 earlier this year).    HTN, chronic HFpEF: Nonobstructive CAD by [facility] Feb 2025, Tn negative x2 here.   - BP up, continue home norvasc , irbesartan . Euvolemic with MIVF ongoing, will continue. Not on high rate IVF due to CHF history, holding lasix  (formal med rec still pending)  Hypokalemia:  - Supplement in IVF  Hypernatremia: Resolved.   AKI on stage IIIb CKD: Improved Cr 1.3 >> 0.94 thus far  - Avoid nephrotoxins   RLS:  - Reorder lyrica , requip  once confirmed  GERD:  - PPI daily   Leukocytosis, thrombocytopenia: Suspect both are reactive to primary process above.  - All cell lines down with IVF. No bleeding or fever. Continue monitoring CBC daily.   Bernardino KATHEE Come, MD [facility] [email or web address] 03/10/2024, 1:02 PM

## 2024-03-11 DIAGNOSIS — K8500 Idiopathic acute pancreatitis without necrosis or infection: Secondary | ICD-10-CM | POA: Diagnosis not present

## 2024-03-11 DIAGNOSIS — K219 Gastro-esophageal reflux disease without esophagitis: Secondary | ICD-10-CM | POA: Diagnosis not present

## 2024-03-11 DIAGNOSIS — J4489 Other specified chronic obstructive pulmonary disease: Secondary | ICD-10-CM | POA: Diagnosis not present

## 2024-03-11 DIAGNOSIS — N1832 Chronic kidney disease, stage 3b: Secondary | ICD-10-CM

## 2024-03-11 LAB — COMPREHENSIVE METABOLIC PANEL WITH GFR
ALT: <5 U/L (ref 0–44)
AST: 12 U/L — ABNORMAL LOW (ref 15–41)
Albumin: 2.6 g/dL — ABNORMAL LOW (ref 3.5–5.0)
Alkaline Phosphatase: 53 U/L (ref 38–126)
Anion gap: 9 (ref 5–15)
BUN: 7 mg/dL — ABNORMAL LOW (ref 8–23)
CO2: 24 mmol/L (ref 22–32)
Calcium: 8.3 mg/dL — ABNORMAL LOW (ref 8.9–10.3)
Chloride: 108 mmol/L (ref 98–111)
Creatinine, Ser: 0.82 mg/dL (ref 0.44–1.00)
GFR, Estimated: >60 mL/min (ref >60)
Glucose, Bld: 102 mg/dL — ABNORMAL HIGH (ref 70–99)
Potassium: 3.2 mmol/L — ABNORMAL LOW (ref 3.5–5.1)
Sodium: 141 mmol/L (ref 135–145)
Total Bilirubin: 0.6 mg/dL (ref 0.0–1.2)
Total Protein: 5.2 g/dL — ABNORMAL LOW (ref 6.5–8.1)

## 2024-03-11 LAB — CBC
HCT: 41.7 % (ref 36.0–46.0)
Hemoglobin: 13.2 g/dL (ref 12.0–15.0)
MCH: 30.8 pg (ref 26.0–34.0)
MCHC: 31.7 g/dL (ref 30.0–36.0)
MCV: 97.4 fL (ref 80.0–100.0)
Platelets: 81 K/uL — ABNORMAL LOW (ref 150–400)
RBC: 4.28 MIL/uL (ref 3.87–5.11)
RDW: 13.1 % (ref 11.5–15.5)
WBC: 9.4 K/uL (ref 4.0–10.5)
nRBC: 0 % (ref 0.0–0.2)

## 2024-03-11 MED ORDER — POTASSIUM CHLORIDE CRYS ER 20 MEQ PO TBCR
40.0000 meq | EXTENDED_RELEASE_TABLET | ORAL | Status: AC
  Administered 2024-03-11 (×2): 40 meq via ORAL
  Filled 2024-03-11 (×2): qty 2

## 2024-03-11 NOTE — Plan of Care (Signed)
   Problem: Education: Goal: Knowledge of General Education information will improve Description Including pain rating scale, medication(s)/side effects and non-pharmacologic comfort measures Outcome: Progressing   Problem: Education: Goal: Knowledge of General Education information will improve Description Including pain rating scale, medication(s)/side effects and non-pharmacologic comfort measures Outcome: Progressing

## 2024-03-11 NOTE — Progress Notes (Signed)
 Mobility Specialist Progress Note:    03/11/24 1440  Mobility  Activity Ambulated with assistance  Level of Assistance Modified independent, requires aide device or extra time  Assistive Device Front wheel walker  Distance Ambulated (ft) 200 ft  Range of Motion/Exercises Active;All extremities  Activity Response Tolerated well  Mobility Referral Yes  Mobility visit 1 Mobility  Mobility Specialist Start Time (ACUTE ONLY) 1440  Mobility Specialist Stop Time (ACUTE ONLY) 1500  Mobility Specialist Time Calculation (min) (ACUTE ONLY) 20 min   Pt received in bed, agreeable to mobility. ModI to stand and ambulate with RW. Tolerated well,asx throughout. Left sitting EOB, alarm on. All needs met.  Courtland Reas Mobility Specialist Please contact via Special educational needs teacher or  Rehab office at [phone]

## 2024-03-11 NOTE — Progress Notes (Signed)
 Pt ambulated to bathroom without oxy. Checked osat status on room air.  Pt currently Osat at 95%.This pt is displaying no episodes of confusion or delirium. Pt states she feels much better, believes the morphine  was the cause for the episodes of confusion. Will continue to monitor, pt care ongoing.

## 2024-03-11 NOTE — Progress Notes (Signed)
 [facility] PROGRESS NOTE  YULIANA VANDRUNEN (DOB: 14-Mar-1941) FMW:[id number] PCP: Zollie Lowers, MD  Brief Narrative: [name] is an 83 y.o. female with a history of CVA on plavix , HLD, chronic HFpEF, HTN, nonobstructive CAD, COPD with ongoing occasional tobacco use, RLS, and GERD who presented to the ED 10/ 12 with abdominal and chest pain. Work up in the ED included normal troponins, mild leukocytosis without fever, and lipase elevated to 1,992 with otherwise normal LFTs. CT abd/pelvis confirms acute pancreatitis for which she was admitted.   Subjective: - Less confused - Endorses postprandial abdominal discomfort with clear liquids - Nausea but no emesis  Objective: BP (!) 125/58 (BP Location: Right Arm)   Pulse 72   Temp 98.4 F (36.9 C) (Oral)   Resp 17   Ht 5' 1 (1.549 m)   Wt 52.6 kg   SpO2 99%   BMI 21.91 kg/m   Gen: No distress. Pt is elderly but WDWN, active Pulm: Clear, nonlabored  CV: RRR, no MRG or edema GI: Soft, stable epigastric tenderness without rebound, +BS Neuro: Alert and oriented. No new focal deficits. Ext: Warm, no deformities Skin: No rashes, lesions or ulcers on visualized skin   Assessment & Plan: Acute pancreatitis: With surrounding edema and free fluid but no necrosis or pseudocyst. HOP calcifications interesting as she has no hx similar symptoms. A CT April 2024 showed normal pancreas. 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 No gallstones, no significant EtOH history, trig's normal. There is pancreatic duct dilation in this 83yo with first episode of pancreatitis and long smoking history, we will need to evaluate for malignancy.  - Continue IVF,  =-Unable to advance diet at this time as patient is having postprandial abdominal pain with clear liquid -Nausea but no emesis - IgG4 is still in process - Continue as needed antiemetics and as needed opiates for pain control   Acute hypoxic respiratory failure:  Suspect mild atelectasis on fairly significant COPD. No wheezing or pneumonia or significant edema/effusions.  - Back on oxygen -Desaturates with ambulation -Suspect due to underlying COPD   COPD: PFTs 2023 with FEV 58%, FVC 77%, ratio 74%, TLC 129%, DLCO 68%. No reversibility. Still smoking - Continue bronchodilators -Oxygen requirement as above   Tobacco use:  - Cessation counseling provided, nicotine  patch declined.  - Suspected reason for mild polycythemia in addition to hemoconcentration. - Patient is not ready to quit smoking   History of CVA, HLD:  - Continue plavix  if able to tolerate (minimizing po burden). Will hold statin. Note pt declined infusion therapy (despite LDL 119 earlier this year).    HTN, chronic HFpEF: Nonobstructive CAD by [facility] Feb 2025, Tn negative x2 here.   - -Continue amlodipine  10 mg daily as well as Avapro  - IV hydralazine  as needed elevated BP  Hypokalemia:  - Supplement in IVF  Hypernatremia: Resolved.   AKI on stage IIIb CKD: Improved Cr 1.3 >> 0.94 thus far  - Avoid nephrotoxins   RLS:  - Reorder lyrica , requip  once confirmed   GERD:  - Change Protonix  to oral   -Stable thrombocytopenia noted--- no bleeding concerns  Rendall Carwin, MD [facility] [email or web address] 03/11/2024, 7:48 PM

## 2024-03-11 NOTE — Progress Notes (Signed)
 Nurse at bedside, patient assisted to and from bathroom,times one assist.Patient back in bed,call bell in reach.Plan of care ongoing.

## 2024-03-11 NOTE — Care Management Important Message (Signed)
 Important Message  Patient Details  Name: [name] MRN: [id number] Date of Birth: 09-28-1940   Important Message Given:  Yes - Medicare IM     Sim Choquette L Fines Kimberlin 03/11/2024, 12:04 PM

## 2024-03-11 NOTE — Plan of Care (Signed)
  Problem: Clinical Measurements: Goal: Ability to maintain clinical measurements within normal limits will improve Outcome: Progressing Goal: Will remain free from infection Outcome: Progressing Goal: Diagnostic test results will improve Outcome: Progressing   Problem: Activity: Goal: Risk for activity intolerance will decrease Outcome: Progressing   Problem: Coping: Goal: Level of anxiety will decrease Outcome: Progressing   Problem: Pain Managment: Goal: General experience of comfort will improve and/or be controlled Outcome: Progressing   Problem: Safety: Goal: Ability to remain free from injury will improve Outcome: Progressing

## 2024-03-12 LAB — BASIC METABOLIC PANEL WITH GFR
Anion gap: 6 (ref 5–15)
BUN: 7 mg/dL — ABNORMAL LOW (ref 8–23)
CO2: 27 mmol/L (ref 22–32)
Calcium: 8.9 mg/dL (ref 8.9–10.3)
Chloride: 107 mmol/L (ref 98–111)
Creatinine, Ser: 0.96 mg/dL (ref 0.44–1.00)
GFR, Estimated: 59 mL/min — ABNORMAL LOW (ref >60)
Glucose, Bld: 88 mg/dL (ref 70–99)
Potassium: 4.4 mmol/L (ref 3.5–5.1)
Sodium: 140 mmol/L (ref 135–145)

## 2024-03-12 MED ORDER — KCL IN DEXTROSE-NACL 20-5-0.45 MEQ/L-%-% IV SOLN: INTRAVENOUS | Status: DC

## 2024-03-12 MED ORDER — POLYETHYLENE GLYCOL 3350 17 G PO PACK
17.0000 g | PACK | Freq: Two times a day (BID) | ORAL | Status: DC
  Administered 2024-03-12: 17 g via ORAL
  Filled 2024-03-12 (×2): qty 1

## 2024-03-12 MED ORDER — SENNOSIDES-DOCUSATE SODIUM 8.6-50 MG PO TABS
2.0000 | ORAL_TABLET | Freq: Every day | ORAL | Status: DC
  Administered 2024-03-12: 2 via ORAL
  Filled 2024-03-12: qty 2

## 2024-03-12 NOTE — Progress Notes (Signed)
 Mobility Specialist Progress Note:    03/12/24 1325  Mobility  Activity Pivoted/transferred to/from [facility]  Level of Assistance Modified independent, requires aide device or extra time  Assistive Device None  Distance Ambulated (ft) 3 ft  Range of Motion/Exercises Active;All extremities  Activity Response Tolerated well  Mobility Referral Yes  Mobility visit 1 Mobility  Mobility Specialist Start Time (ACUTE ONLY) 1325  Mobility Specialist Stop Time (ACUTE ONLY) 1345  Mobility Specialist Time Calculation (min) (ACUTE ONLY) 20 min   Pt received sitting EOB, bed alarm going off. ModI to stand and transfer with no AD. Tolerated well,asx throughout. Left on BSC, NT notified. All needs met.  Danniel Tones Mobility Specialist Please contact via Special educational needs teacher or  Rehab office at [phone]

## 2024-03-12 NOTE — Plan of Care (Signed)
  Problem: Health Behavior/Discharge Planning: Goal: Ability to manage health-related needs will improve 03/12/2024 0548 by German Hollering, LPN Outcome: Progressing 03/12/2024 0547 by German Hollering, LPN Outcome: Progressing   Problem: Clinical Measurements: Goal: Ability to maintain clinical measurements within normal limits will improve 03/12/2024 0548 by German Hollering, LPN Outcome: Progressing 03/12/2024 0547 by German Hollering, LPN Outcome: Progressing Goal: Will remain free from infection 03/12/2024 0548 by German Hollering, LPN Outcome: Progressing 03/12/2024 0547 by German Hollering, LPN Outcome: Progressing Goal: Cardiovascular complication will be avoided Outcome: Progressing   Problem: Nutrition: Goal: Adequate nutrition will be maintained Outcome: Progressing   Problem: Coping: Goal: Level of anxiety will decrease Outcome: Progressing   Problem: Elimination: Goal: Will not experience complications related to urinary retention Outcome: Progressing   Problem: Safety: Goal: Ability to remain free from injury will improve Outcome: Progressing   Problem: Skin Integrity: Goal: Risk for impaired skin integrity will decrease Outcome: Progressing

## 2024-03-12 NOTE — Progress Notes (Signed)
 [facility] PROGRESS NOTE  [name] (DOB: Apr 11, 1941) FMW:[id number] PCP: Zollie Lowers, MD  Brief Narrative: [name] is an 83 y.o. female with a history of CVA on plavix , HLD, chronic HFpEF, HTN, nonobstructive CAD, COPD with ongoing occasional tobacco use, RLS, and GERD who presented to the ED 10/ 12 with abdominal and chest pain. Work up in the ED included normal troponins, mild leukocytosis without fever, and lipase elevated to 1,992 with otherwise normal LFTs. CT abd/pelvis confirms acute pancreatitis for which she was admitted.   Subjective: - -Improving abdominal pain- no emesis - Willing to try full liquid diet after trying clear liquids -   Objective: BP 134/67 (BP Location: Right Arm)   Pulse 79   Temp 98.2 F (36.8 C) (Oral)   Resp 18   Ht 5' 1 (1.549 m)   Wt 52.6 kg   SpO2 97%   BMI 21.91 kg/m   Gen: No distress. Pt is elderly but WDWN, active Pulm: Clear, nonlabored  CV: RRR, no MRG or edema GI: Soft, stable epigastric tenderness without rebound, +BS Neuro: Alert and oriented. No new focal deficits. Ext: Warm, no deformities Skin: No rashes, lesions or ulcers on visualized skin   Assessment & Plan: 1)Acute idiopathic pancreatitis: With surrounding edema and free fluid but no necrosis or pseudocyst. HOP calcifications interesting as she has no hx similar symptoms. A CT April 2024 showed normal pancreas. 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 No gallstones, no significant EtOH history, trig's normal. There is pancreatic duct dilation in this 83yo with first episode of pancreatitis and long smoking history, we will need to evaluate for malignancy.  -Abdominal pain and nausea improving,  -Doing better with clear liquids -Will attempt full liquids as of 03/12/2024--continue IV fluids until oral intake is more reliable- - IgG4 WNL at 16 - Continue as needed antiemetics and as needed opiates for pain control   Acute  hypoxic respiratory failure: Suspect mild atelectasis on fairly significant COPD. No wheezing or pneumonia or significant edema/effusions.  -Hypoxia improving--weaning off oxygen -Suspect due to underlying COPD   COPD: PFTs 2023 with FEV 58%, FVC 77%, ratio 74%, TLC 129%, DLCO 68%. No reversibility. Still smoking - Continue bronchodilators -Oxygen requirement as above   Tobacco use:  - Cessation counseling provided, nicotine  patch declined.  - Suspected reason for mild polycythemia in addition to hemoconcentration. - Patient is not ready to quit smoking   History of CVA, HLD:  - Continue plavix  if able to tolerate (minimizing po burden). Will hold statin. Note pt declined infusion therapy (despite LDL 119 earlier this year).    HTN, chronic HFpEF: Nonobstructive CAD by [facility] Feb 2025, Tn negative x2 here.   - -Continue amlodipine  10 mg daily as well as Avapro  - IV hydralazine  as needed elevated BP  Hypokalemia:  -Normalized with replacement  Hypernatremia: Resolved.   AKI on stage IIIb CKD: Improved Cr 1.3 >> 0.94 thus far  - Avoid nephrotoxins   RLS:  - Reorder lyrica , requip  once confirmed   GERD:  - Change Protonix  to oral   -Stable thrombocytopenia noted--- no bleeding concerns  Rendall Carwin, MD [facility] [email or web address] 03/12/2024, 6:38 PM

## 2024-03-12 NOTE — Plan of Care (Signed)

## 2024-03-13 ENCOUNTER — Telehealth: Payer: Self-pay | Admitting: *Deleted

## 2024-03-13 DIAGNOSIS — I10 Essential (primary) hypertension: Secondary | ICD-10-CM

## 2024-03-13 DIAGNOSIS — Z720 Tobacco use: Secondary | ICD-10-CM

## 2024-03-13 LAB — CBC
HCT: 39.6 % (ref 36.0–46.0)
Hemoglobin: 12.9 g/dL (ref 12.0–15.0)
MCH: 30.8 pg (ref 26.0–34.0)
MCHC: 32.6 g/dL (ref 30.0–36.0)
MCV: 94.5 fL (ref 80.0–100.0)
Platelets: 145 K/uL — ABNORMAL LOW (ref 150–400)
RBC: 4.19 MIL/uL (ref 3.87–5.11)
RDW: 13.2 % (ref 11.5–15.5)
WBC: 6.8 K/uL (ref 4.0–10.5)
nRBC: 0 % (ref 0.0–0.2)

## 2024-03-13 MED ORDER — ISOSORBIDE MONONITRATE ER 60 MG PO TB24: 60.0000 mg | ORAL_TABLET | Freq: Every day | ORAL | 3 refills | Status: DC

## 2024-03-13 MED ORDER — AMLODIPINE-OLMESARTAN 10-40 MG PO TABS: 1.0000 | ORAL_TABLET | Freq: Every day | ORAL | 3 refills | Status: DC

## 2024-03-13 MED ORDER — PANTOPRAZOLE SODIUM 40 MG PO TBEC: 40.0000 mg | DELAYED_RELEASE_TABLET | Freq: Every day | ORAL | 5 refills | Status: AC

## 2024-03-13 MED ORDER — FLUTICASONE FUROATE-VILANTEROL 200-25 MCG/ACT IN AEPB: 1.0000 | INHALATION_SPRAY | Freq: Every day | RESPIRATORY_TRACT | 5 refills | Status: AC

## 2024-03-13 MED ORDER — CLOPIDOGREL BISULFATE 75 MG PO TABS: 75.0000 mg | ORAL_TABLET | Freq: Every day | ORAL | 3 refills | Status: DC

## 2024-03-13 NOTE — Progress Notes (Signed)
 Denies abd pain or nausea.  Has had another bm this morning.  IV removed and discharge instructions reviewed.  Secretary calling a transport service for ride home to [place].  New scripts sent to pharmacy and to follow up with primary

## 2024-03-13 NOTE — Discharge Summary (Signed)
 [name], is a 83 y.o. female  DOB Jan 05, 1941  MRN [id number].  Admission date:  03/08/2024  Admitting Physician  Bernardino KATHEE Come, MD  Discharge Date:  03/13/2024   Primary MD  Zollie Lowers, MD  Recommendations for primary care physician for things to follow:  1)Avoid ibuprofen /Advil /Aleve/Motrin Josefine Powders/Naproxen/BC powders/Meloxicam /Diclofenac/Indomethacin and other Nonsteroidal anti-inflammatory medications as these will make you more likely to bleed and can cause stomach ulcers, can also cause Kidney problems.   2)follow up with Zollie Lowers, MD (Primary Care Physician) within 1 week for recheck   Admission Diagnosis  Acute pancreatitis [K85.90] Idiopathic acute pancreatitis without infection or necrosis [K85.00]   Discharge Diagnosis  Acute pancreatitis [K85.90] Idiopathic acute pancreatitis without infection or necrosis [K85.00]    Principal Problem:   Acute pancreatitis Active Problems:   Essential hypertension with goal blood pressure less than 130/80   Hyperlipidemia LDL goal <70   COPD with chronic bronchitis (HCC)   Stage 3b chronic kidney disease (HCC)   Tobacco abuse   GERD (gastroesophageal reflux disease)   History of CVA (cerebrovascular accident)      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   Ileus (HCC) 05/15/2021   Influenza A 07/15/2023   NSTEMI (non-ST elevated  myocardial infarction) (HCC) 07/10/2023   Stroke (HCC)    SLIGHT RT SIDE WEAKNESS 2001   Unintentional weight loss 03/19/2022    Past Surgical History:  Procedure Laterality Date   ABDOMINAL HYSTERECTOMY     ANTERIOR CERVICAL CORPECTOMY  03/07/2012   Procedure: ANTERIOR CERVICAL CORPECTOMY;  Surgeon: Victory Gens, MD;  Location: [facility];  Service: Neurosurgery;  Laterality: N/A;  Cervical six-seven, cervical seven-thoracic one Anterior cervical decompression/diskectomy/fusion, with Cervical seven Corpectomy, reconstruction using Allograft and Alphatec plate   CATARACT EXTRACTION W/PHACO Left 04/12/2017   Procedure: CATARACT EXTRACTION PHACO AND INTRAOCULAR LENS PLACEMENT (IOC);  Surgeon: Harrie Agent, MD;  Location: [facility];  Service: Ophthalmology;  Laterality: Left;  CDE: 3.82   CATARACT EXTRACTION W/PHACO Right 05/03/2017   Procedure: CATARACT EXTRACTION PHACO AND INTRAOCULAR LENS PLACEMENT RIGHT EYE;  Surgeon: Harrie Agent, MD;  Location: [facility];  Service: Ophthalmology;  Laterality: Right;  CDE: 3.89   LEFT HEART CATH AND CORONARY ANGIOGRAPHY N/A 07/11/2023   Procedure: LEFT HEART CATH AND CORONARY ANGIOGRAPHY;  Surgeon: Elmira Newman PARAS, MD;  Location: [facility];  Service: Cardiovascular;  Laterality: N/A;   MULTIPLE TOOTH EXTRACTIONS     TONSILLECTOMY  HPI  from the history and physical done on the day of admission:   HPI: [name] is an 83 y.o. female with a history of CVA on plavix , HLD, chronic HFpEF, HTN, nonobstructive CAD, COPD with ongoing occasional tobacco use, RLS, and GERD who presented to the ED with abdominal and chest pain. She reports development of moderate abdominal pain starting at the bottom of the belly, radiating all the way to her chest, also over the next 24 hours started radiating bilaterally from her upper abdomen to her back. This worsened overall, was always worse when taking anything by mouth, so stopped eating/drinking.  +nausea, a single episode of stomach-content emesis. When she woke up this morning, the pain was severe (like she was in labor), so she called EMS. She denies any change with exertion or dyspnea.    No history of pancreatitis or diabetes, no FH either. No recent fever, chills, unintentional weight loss. No new medications. No hx gallstones. She drinks a beer a couple times a month, smokes occasionally, no illicit drugs (with one exception where she took a gummy and never took it again because my dog and cat started speaking [place].)     Work up in the ED included normal troponins, mild leukocytosis without fever, and lipase elevated to 1,992 with otherwise normal LFTs. CT abd/pelvis confirms acute pancreatitis. IVF, IV zofran , IV morphine  given and admission requested. In the ED she was put on supplemental oxygen but denies dyspnea. Also took some ice for dry mouth which caused significant worsening of epigastric pain.   Review of Systems: As mentioned in the history of present illness. All other systems reviewed and are negative.   Hospital Course:    Assessment and Plan: 1)Acute idiopathic Pancreatitis: With surrounding edema and free fluid but no necrosis or pseudocyst. HOP calcifications interesting as she has no hx similar symptoms. A CT April 2024 showed normal pancreas. 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 No gallstones, no significant EtOH history, trig's normal. There is pancreatic duct dilation in the setting of imaging evidence of chronic pancreatitis with calcifications in the pancreatic head.  -Abdominal pain and nausea improved significantly -Tolerated soft diet today -Had large BM - IgG4 WNL at 16 - Outpatient follow-up with PCP advised   2)Acute hypoxic respiratory failure: Suspect mild atelectasis on fairly significant COPD. No wheezing or pneumonia or significant edema/effusions.  -Hypoxia resolved, patient has been weaned off O2 -Post  ambulation O2 sats on room air 95 to 96% -Due to underlying COPD   COPD: PFTs 2023 with FEV 58%, FVC 77%, ratio 74%, TLC 129%, DLCO 68%. No reversibility. Still smoking - Smoking cessation advised -Continue bronchodilator   Tobacco use:  - Cessation counseling provided, nicotine  patch declined.  - Suspected reason for mild polycythemia in addition to hemoconcentration. - Patient is not ready to quit smoking   History of CVA, HLD: --Asymptomatic - Continue atorvastatin  and Plavix    HTN, chronic HFpEF: Nonobstructive CAD by [facility] Feb 2025, Tn negative x2 here.   - -Continue amlodipine /olmesartan    Hypokalemia:  -Normalized with replacement   Hypernatremia: Resolved.   AKI on stage IIIb CKD: Improved Cr 1.3 >> 0.94 thus far  - Avoid nephrotoxins   RLS:  - Reorder lyrica , requip  once confirmed   GERD:  - Change Protonix  to oral   -Stable thrombocytopenia noted--- no bleeding concerns - Platelets up to 145k from 71k this admission  Discharge Condition: stable  Follow UP  Follow-up Information     Zollie Lowers, MD Follow up in 1 week(s).   Specialty: Family Medicine Contact information: [address] [place] [address] [phone]                 Diet and Activity recommendation:  As advised  Discharge Instructions   Discharge Instructions     Call MD for:  difficulty breathing, headache or visual disturbances   Complete by: As directed    Call MD for:  persistant nausea and vomiting   Complete by: As directed    Call MD for:  temperature >100.4   Complete by: As directed    Diet - low sodium heart healthy   Complete by: As directed    Discharge instructions   Complete by: As directed    1)Avoid ibuprofen /Advil /Aleve/Motrin /Goody Powders/Naproxen/BC powders/Meloxicam /Diclofenac/Indomethacin and other Nonsteroidal anti-inflammatory medications as these will make you more likely to bleed and can cause stomach ulcers, can also cause Kidney problems.    2)follow up with Zollie Lowers, MD (Primary Care Physician) within 1 week for recheck   Increase activity slowly   Complete by: As directed          Discharge Medications     Allergies as of 03/13/2024       Reactions   Penicillins Anaphylaxis, Swelling, Rash, Other (See Comments)   Immediate rash, facial/tongue/throat swelling, SOB or lightheadedness with hypotension        Medication List     TAKE these medications    acetaminophen  500 MG tablet Commonly known as: TYLENOL  Take 2 tablets (1,000 mg total) by mouth 3 (three) times daily. What changed:  how much to take when to take this reasons to take this   albuterol  108 (90 Base) MCG/ACT inhaler Commonly known as: VENTOLIN  HFA Inhale 2 puffs into the lungs every 4 (four) hours as needed for wheezing or shortness of breath. What changed: See the new instructions.   alendronate  70 MG tablet Commonly known as: FOSAMAX  Take 1 tablet (70 mg total) by mouth every 7 (seven) days. Take with a full glass of water  on an empty stomach.   amLODipine -olmesartan  10-40 MG tablet Commonly known as: AZOR  Take 1 tablet by mouth daily. For blood pressure.   atorvastatin  80 MG tablet Commonly known as: LIPITOR  Take 1 tablet (80 mg total) by mouth daily.   cloNIDine  0.2 MG tablet Commonly known as: CATAPRES  Take 1.5 tablets morning and evening, and 1 tablet mid-day.   clopidogrel  75 MG tablet Commonly known as: PLAVIX  Take 1 tablet (75 mg total) by mouth daily.   cyanocobalamin  1000 MCG tablet Take 1 tablet (1,000 mcg total) by mouth daily.   fluticasone  furoate-vilanterol 200-25 MCG/ACT Aepb Commonly known as: Breo Ellipta  Inhale 1 puff into the lungs daily.   furosemide  20 MG tablet Commonly known as: LASIX  TAKE 1 TABLET (20 MG TOTAL) BY MOUTH DAILY. FOR SWELLING   isosorbide  mononitrate 60 MG 24 hr tablet Commonly known as: IMDUR  Take 1 tablet (60 mg total) by mouth daily. What changed: See the new  instructions.   Magnesium  250 MG Tabs Take 1 tablet (250 mg total) by mouth daily.   neomycin -polymyxin-hydrocortisone 3.5-10000-1 ophthalmic suspension Commonly known as: CORTISPORIN Place 3 drops into both eyes 4 (four) times daily.   pantoprazole  40 MG tablet Commonly known as: PROTONIX  Take 1 tablet (40 mg total) by mouth daily.   potassium chloride  SA 20 MEQ tablet Commonly known as: KLOR-CON  M Take 1 tablet (20 mEq total)  by mouth daily. For potassium replacement/ supplement   pregabalin  300 MG capsule Commonly known as: Lyrica  Take 1 capsule (300 mg total) by mouth at bedtime. To reduce back pain   rOPINIRole  1 MG tablet Commonly known as: REQUIP  TAKE 1 TABLET (1 MG TOTAL) BY MOUTH AT BEDTIME. FOR LEG CRAMPS   tizanidine  2 MG capsule Commonly known as: ZANAFLEX  Take 2 mg by mouth at bedtime.        Major procedures and Radiology Reports - PLEASE review detailed and final reports for all details, in brief -   CT ABDOMEN PELVIS W CONTRAST Result Date: 03/08/2024 EXAM: CT ABDOMEN AND PELVIS WITH CONTRAST 03/08/2024 11:07:40 AM TECHNIQUE: CT of the abdomen and pelvis was performed with the administration of 80 mL of iohexol  (OMNIPAQUE ) 300 MG/ML solution. Multiplanar reformatted images are provided for review. Automated exposure control, iterative reconstruction, and/or weight-based adjustment of the mA/kV was utilized to reduce the radiation dose to as low as reasonably achievable. COMPARISON: None available. CLINICAL HISTORY: Abdominal pain, acute, nonlocalized; lipase elevated. Abd pain; Lipase elevated; Omnipaque  300; 80 cc ; BIB [facility] from home for complaint of chest pain that started yesturday and vomitied once. Informed EMS upper center abdomen now hurting and radiating toward chest. FINDINGS: LOWER CHEST: No acute abnormality. LIVER: There are a few scattered hypodensities, less than 1 cm, too small to characterize. There are also several cysts which appear unchanged  from the prior exam which measure up to 1.2 cm. GALLBLADDER AND BILE DUCTS: Gallbladder is unremarkable. No biliary ductal dilatation. SPLEEN: No acute abnormality. PANCREAS: Calcifications within the head of the pancreas are identified, compatible with changes due to chronic pancreatitis. The main pancreatic duct is mildly dilated at the level of the pancreatic neck and head, measuring up to 4 mm, image 26/2. Diffuse pancreatic edema with peripancreatic fat stranding is noted. Peripancreatic fluid extends into the lesser sac, along bilateral pericolic gutters, and into the pelvis. Mild perisplenic and perihepatic fluid noted. Edema extends into the central mesenteric fat. No signs of pancreatic necrosis or pseudocyst formation. ADRENAL GLANDS: Normal size and morphology bilaterally. No nodule, thickening, or hemorrhage. No periadrenal stranding. KIDNEYS, URETERS AND BLADDER: No stones in the kidneys or ureters. No hydronephrosis. No perinephric or periureteral stranding. Bosniak class 1 cyst of the posterior right kidney measures 1.4 cm. Bosniak class 1 left parapelvic cyst is also noted. These require no further follow-up. Left renal cortical scarring. Urinary bladder is unremarkable. GI AND BOWEL: Mild gastric distention. Small hiatal hernia with a small amount of fluid extending into the hernia sac. Redundant transverse colon. A few scattered loops of mild wall thickening of the transverse and descending colon is identified, nonspecific and possibly due to incomplete distention. The sigmoid colon demonstrates diverticulosis without evidence of diverticulitis. The appendix is visualized and appears normal. No pathologic dilatation of the large or small bowel loops. PERITONEUM AND RETROPERITONEUM: Peripancreatic fluid extends into the lesser sac, along bilateral pericolic gutters over the right lobe of liver and into the pelvis. Edema is noted within the central mesentery. No signs of pneumoperitoneum. No focal  fluid collections. VASCULATURE: Aorta is normal in caliber. Extensive aortic atherosclerotic calcifications. Abdominal vascularity appears patent. LYMPH NODES: No lymphadenopathy. REPRODUCTIVE ORGANS: Status post hysterectomy. No adnexal mass. BONES AND SOFT TISSUES: No acute osseous abnormality. No focal soft tissue abnormality. IMPRESSION: 1. Acute pancreatitis with diffuse pancreatic edema and extensive peripancreatic fluid tracking into the lesser sac, pericolic gutters, and pelvis. No pancreatic necrosis or pseudocyst. 2. 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 3. Aortic atherosclerotic calcification. Electronically signed by: Waddell Calk MD 03/08/2024 11:48 AM EDT RP Workstation: [id number]   DG Chest Port 1 View Result Date: 03/08/2024 CLINICAL DATA:  cp EXAM: PORTABLE CHEST 1 VIEW COMPARISON:  July 10, 2023 FINDINGS: The cardiomediastinal silhouette is unchanged in contour.Atherosclerotic calcifications of the aorta. No pleural effusion. No pneumothorax. New band like opacity of the RIGHT apex. IMPRESSION: 1. New band like opacity of the RIGHT apex. This likely reflects atelectasis but underlying infection could reflect similarly. 2. Followup PA and lateral chest X-ray is recommended in 4-6 weeks to ensure resolution and exclude underlying malignancy. Electronically Signed   By: Corean Salter M.D.   On: 03/08/2024 09:30   Today   Subjective    Beza Bonillas today has no new complaint - Eating or drinking No emesis - Had large soft BM -No fever  Or chills           Patient has been seen and examined prior to discharge   Objective   Blood pressure (!) 141/53, pulse 85, temperature 98.4 F (36.9 C), temperature source Oral, resp. rate 16, height 5' 1 (1.549 m), weight 52.6 kg, SpO2 92%.   Intake/Output Summary (Last 24 hours) at 03/13/2024 1028 Last data filed at 03/13/2024 0937 Gross per 24 hour   Intake 720 ml  Output 2700 ml  Net -1980 ml   Exam Gen:- Awake Alert, no acute distress  HEENT:- [place].AT, No sclera icterus Neck-Supple Neck,No JVD,.  Lungs-  CTAB , good air movement bilaterally CV- S1, S2 normal, regular Abd-  +ve B.Sounds, Abd Soft, No significant tenderness,    Extremity/Skin:- No  edema,   good pulses Psych-affect is appropriate, oriented x3 Neuro-no new focal deficits, no tremors    Data Review   CBC w Diff:  Lab Results  Component Value Date   WBC 6.8 03/13/2024   HGB 12.9 03/13/2024   HGB 11.5 12/17/2023   HCT 39.6 03/13/2024   HCT 36.2 12/17/2023   PLT 145 (L) 03/13/2024   PLT CANCELED 12/17/2023   LYMPHOPCT 24 03/25/2022   MONOPCT 5 03/25/2022   EOSPCT 10 03/25/2022   BASOPCT 1 03/25/2022   CMP:  Lab Results  Component Value Date   NA 140 03/12/2024   NA 144 12/17/2023   K 4.4 03/12/2024   CL 107 03/12/2024   CO2 27 03/12/2024   BUN 7 (L) 03/12/2024   BUN 20 12/17/2023   CREATININE 0.96 03/12/2024   PROT 5.2 (L) 03/11/2024   PROT 6.9 12/17/2023   ALBUMIN 2.6 (L) 03/11/2024   ALBUMIN 4.0 12/17/2023   BILITOT 0.6 03/11/2024   BILITOT 0.3 12/17/2023   ALKPHOS 53 03/11/2024   AST 12 (L) 03/11/2024   ALT <5 03/11/2024  .  Total Discharge time is about 33 minutes  Rendall Carwin M.D on 03/13/2024 at 10:28 AM  Go to [email or web address] -  for contact info  [facility] - Office  [phone]

## 2024-03-13 NOTE — Progress Notes (Signed)
 Mobility Specialist Progress Note:    03/13/24 0941  Mobility  Activity Ambulated with assistance  Level of Assistance Modified independent, requires aide device or extra time  Assistive Device Front wheel walker  Distance Ambulated (ft) 300 ft  Range of Motion/Exercises Active;All extremities  Activity Response Tolerated well  Mobility Referral Yes  Mobility visit 1 Mobility  Mobility Specialist Start Time (ACUTE ONLY) 0941  Mobility Specialist Stop Time (ACUTE ONLY) 0959  Mobility Specialist Time Calculation (min) (ACUTE ONLY) 18 min   Pt received in bed, agreeable to mobility. ModI to stand and ambulate with RW. Tolerated well,asx throughout. Returned supine, all needs met.  [name] Mobility Specialist Please contact via Special educational needs teacher or  Rehab office at [phone]

## 2024-03-13 NOTE — Plan of Care (Signed)
  Problem: Health Behavior/Discharge Planning: Goal: Ability to manage health-related needs will improve Outcome: Progressing   Problem: Clinical Measurements: Goal: Ability to maintain clinical measurements within normal limits will improve Outcome: Progressing Goal: Will remain free from infection Outcome: Progressing   Problem: Activity: Goal: Risk for activity intolerance will decrease Outcome: Progressing   Problem: Coping: Goal: Level of anxiety will decrease Outcome: Progressing   Problem: Elimination: Goal: Will not experience complications related to bowel motility Outcome: Progressing Goal: Will not experience complications related to urinary retention Outcome: Progressing   Problem: Pain Managment: Goal: General experience of comfort will improve and/or be controlled Outcome: Progressing   Problem: Safety: Goal: Ability to remain free from injury will improve Outcome: Progressing

## 2024-03-13 NOTE — Discharge Instructions (Signed)
 1)Avoid ibuprofen /Advil /Aleve/Motrin Josefine Powders/Naproxen/BC powders/Meloxicam /Diclofenac/Indomethacin and other Nonsteroidal anti-inflammatory medications as these will make you more likely to bleed and can cause stomach ulcers, can also cause Kidney problems.   2)follow up with Zollie Lowers, MD (Primary Care Physician) within 1 week for recheck

## 2024-03-13 NOTE — Care Management Important Message (Signed)
 Important Message  Patient Details  Name: [name] MRN: [id number] Date of Birth: January 09, 1941   Important Message Given:  Yes - Medicare IM     Dock Baccam L [name] 03/13/2024, 10:57 AM

## 2024-03-16 ENCOUNTER — Telehealth: Payer: Self-pay | Admitting: *Deleted

## 2024-03-16 DIAGNOSIS — K8500 Idiopathic acute pancreatitis without necrosis or infection: Secondary | ICD-10-CM

## 2024-03-16 NOTE — Transitions of Care (Post Inpatient/ED Visit) (Signed)
 03/16/2024  Name: [name] MRN: [id number] DOB: 09-06-1940  Today's TOC FU Call Status: Today's TOC FU Call Status:: Successful TOC FU Call Completed TOC FU Call Complete Date: 03/16/24 Patient's Name and Date of Birth confirmed.  Transition Care Management Follow-up Telephone Call Date of Discharge: 03/13/24 [facility] Type of Discharge: Inpatient Admission Primary Inpatient Discharge Diagnosis:: Acute pancreatitis How have you been since you were released from the hospital?: Better (muche better but I think I overdone it. Patient walked up to the store) Any questions or concerns?: No  Items Reviewed: Did you receive and understand the discharge instructions provided?: Yes Medications obtained,verified, and reconciled?: Yes (Medications Reviewed) Any new allergies since your discharge?: No Dietary orders reviewed?: No Do you have support at home?: Yes People in Home [RPT]: significant other Name of Support/Comfort Primary Source: terry  Medications Reviewed Today: Medications Reviewed Today     Reviewed by Kennieth Cathlean DEL, RN (Case Manager) on 03/16/24 at 1312  Med List Status: <None>   Medication Order Taking? Sig Documenting Provider Last Dose Status Informant  acetaminophen  (TYLENOL ) 500 MG tablet [id number] Yes Take 2 tablets (1,000 mg total) by mouth 3 (three) times daily.  Patient taking differently: Take 250 mg by mouth 2 (two) times daily as needed for moderate pain (pain score 4-6).   Zollie Lowers, MD  Active Self, Pharmacy Records  albuterol  (VENTOLIN  HFA) 108 [phone] Base) MCG/ACT inhaler [id number] Yes Inhale 2 puffs into the lungs every 4 (four) hours as needed for wheezing or shortness of breath. Pearlean Manus, MD  Active   alendronate  (FOSAMAX ) 70 MG tablet [id number]  Take 1 tablet (70 mg total) by mouth every 7 (seven) days. Take with a full glass of water  on an empty stomach.  Patient not taking: Reported on 03/16/2024    Zollie Lowers, MD  Active Self, Pharmacy Records           Med Note DARILYN, [place]   Tue Mar 10, 2024  2:40 PM) Pt has not started  amLODipine -olmesartan  (AZOR ) 10-40 MG tablet [id number] Yes Take 1 tablet by mouth daily. For blood pressure. Pearlean Manus, MD  Active   atorvastatin  (LIPITOR ) 80 MG tablet [id number] Yes Take 1 tablet (80 mg total) by mouth daily. Vannie Reche RAMAN, NP  Active Self, Pharmacy Records  cloNIDine  (CATAPRES ) 0.2 MG tablet [id number] Yes Take 1.5 tablets morning and evening, and 1 tablet mid-day. Raford Riggs, MD  Active Self, Pharmacy Records  clopidogrel  (PLAVIX ) 75 MG tablet [id number] Yes Take 1 tablet (75 mg total) by mouth daily. Pearlean Manus, MD  Active   fluticasone  furoate-vilanterol (BREO ELLIPTA ) 200-25 MCG/ACT AEPB [id number]  Inhale 1 puff into the lungs daily. Pearlean Manus, MD  Active   furosemide  (LASIX ) 20 MG tablet [id number] Yes TAKE 1 TABLET (20 MG TOTAL) BY MOUTH DAILY. FOR SWELLING Raford Riggs, MD  Active Self, Pharmacy Records  isosorbide  mononitrate (IMDUR ) 60 MG 24 hr tablet [id number] Yes Take 1 tablet (60 mg total) by mouth daily. Pearlean Manus, MD  Active   Magnesium  250 MG TABS [id number] Yes Take 1 tablet (250 mg total) by mouth daily. Zollie Lowers, MD  Active Self, Pharmacy Records           Med Note SERENE, [facility]   Mon Nov 04, 2023  9:27 AM) Per patient, takes q 2-3 days  neomycin -polymyxin-hydrocortisone (CORTISPORIN) 3.5-10000-1 ophthalmic suspension [id number] Yes Place 3 drops into both eyes 4 (four) times daily. Zollie Lowers, MD  Active Self, Pharmacy Records           Med Note DARILYN, [place] [facility]   Tue Mar 10, 2024  2:40 PM) Pt states she is still using medication  pantoprazole  (PROTONIX ) 40 MG tablet [id number] Yes Take 1 tablet (40 mg total) by mouth daily. Pearlean Manus, MD  Active   potassium chloride  SA (KLOR-CON  M) 20 MEQ tablet [id number] Yes Take 1 tablet (20 mEq total) by mouth daily. For potassium  replacement/ supplement Zollie Lowers, MD  Active Self, Pharmacy Records  pregabalin  (LYRICA ) 300 MG capsule [id number] Yes Take 1 capsule (300 mg total) by mouth at bedtime. To reduce back pain Zollie Lowers, MD  Active Self, Pharmacy Records  rOPINIRole  (REQUIP ) 1 MG tablet [id number] Yes TAKE 1 TABLET (1 MG TOTAL) BY MOUTH AT BEDTIME. FOR LEG CRAMPS Zollie Lowers, MD  Active Self, Pharmacy Records  tizanidine  (ZANAFLEX ) 2 MG capsule [id number] Yes Take 2 mg by mouth at bedtime. [provider]  Active Self, Pharmacy Records  vitamin B-12 1000 MCG tablet [id number] Yes Take 1 tablet (1,000 mcg total) by mouth daily. Vicci Afton CROME, MD  Active Self, Pharmacy Records            Home Care and Equipment/Supplies: Were Home Health Services Ordered?: NA Any new equipment or medical supplies ordered?: NA  Functional Questionnaire: Do you need assistance with bathing/showering or dressing?: No Do you need assistance with meal preparation?: No Do you need assistance with eating?: No Do you have difficulty maintaining continence: No Do you need assistance with getting out of bed/getting out of a chair/moving?: No Do you have difficulty managing or taking your medications?: No  Follow up appointments reviewed: PCP Follow-up appointment confirmed?: Yes Date of PCP follow-up appointment?: 03/17/24 Follow-up Provider: Dr Lowers [facility] Follow-up appointment confirmed?: NA Do you need transportation to your follow-up appointment?: No Do you understand care options if your condition(s) worsen?: Yes-patient verbalized understanding  SDOH Interventions Today    Flowsheet Row Most Recent Value  SDOH Interventions   Food Insecurity Interventions Intervention Not Indicated  Housing Interventions Intervention Not Indicated  Transportation Interventions Intervention Not Indicated, AMB Referral  [per patint she has transportation to appointment. She just needs someone  to take her to do hopping sometimes. Patient is going to contact scat services]  Utilities Interventions Intervention Not Indicated  Depression Interventions/Treatment  Counseling    Goals Addressed             This Visit's Progress    VBCI Transitions of Care (TOC) Care Plan       Problems:  Recent Hospitalization for treatment of Acute Pancreatitis Knowledge Deficit Related to Acute Pancreatitis  Goal:  Over the next 30 days, the patient will not experience hospital readmission  Interventions:  Transitions of Care: Community Resource Referral Made to address Transportation Doctor Visits  - discussed the importance of doctor visits Referral to Longitudinal Nurse Case Manager for Ongoing follow-up  Patient Self Care Activities:  Attend all scheduled provider appointments Call pharmacy for medication refills 3-7 days in advance of running out of medications Notify RN Care Manager of TOC call rescheduling needs Participate in Transition of Care Program/Attend TOC scheduled calls Perform all self care activities independently  Perform IADL's (shopping, preparing meals, housekeeping, managing finances) independently Take medications as prescribed    Plan:  An initial telephone outreach has been scheduled for: [id number] Next PCP appointment scheduled for: 04/17/2024 Telephone follow up appointment with care management team member scheduled  for:  04/24/2024 Mliss Creed 1:00 RN scheduled SW Tillman Clemons 03/20/2024 11:00       Discussed and offered 30 day TOC program.  Patient  accepted.  The patient has been provided with contact information for the care management team and has been advised to call with any health -related questions or concerns.  The patient verbalized understanding with current plan of care.  The patient is directed to their insurance card regarding availability of benefits coverage Cathlean Headland BSN RN [facility] Health Care Management  Coordinator [email or web address] Direct Dial: [phone]  Fax: [phone] Website: [email or web address]

## 2024-03-17 ENCOUNTER — Encounter: Payer: Self-pay | Admitting: Family Medicine

## 2024-03-17 ENCOUNTER — Ambulatory Visit (INDEPENDENT_AMBULATORY_CARE_PROVIDER_SITE_OTHER)

## 2024-03-17 ENCOUNTER — Ambulatory Visit: Admitting: Family Medicine

## 2024-03-17 VITALS — BP 131/66 | HR 92 | Temp 98.0°F | Ht 61.0 in | Wt 117.6 lb

## 2024-03-17 DIAGNOSIS — K859 Acute pancreatitis without necrosis or infection, unspecified: Secondary | ICD-10-CM

## 2024-03-17 DIAGNOSIS — M19011 Primary osteoarthritis, right shoulder: Secondary | ICD-10-CM | POA: Diagnosis not present

## 2024-03-17 NOTE — Patient Instructions (Signed)
Pancreatitis Eating Plan Pancreatitis is when your pancreas gets irritated and swollen (inflamed). The pancreas is a small gland behind your stomach. It helps your body digest food and regulate your blood sugar. Pancreatitis can affect how your body digests food, especially foods with fat. You may also have other symptoms such as pain in your abdomen (abdominal pain) or nausea. When you have pancreatitis, following a low-fat eating plan may help you manage symptoms and recover faster. Work with your health care provider or a dietitian to create an eating plan that is right for you. What are tips for following this plan? Reading food labels Use the information on food labels to help keep track of how much fat you eat: Check the serving size. Look for the amount of total fat in grams (g) in one serving. Low-fat foods have 3 g of fat or less per serving. Fat-free foods have 0.5 g of fat or less per serving. Keep track of how much fat you eat based on how many servings you eat. For example, if you eat two servings, the amount of fat you eat will be twice what is listed on the label. Shopping  Buy low-fat or nonfat foods, such as: Fresh, frozen, or canned fruits and vegetables. Grains, including pasta, bread, and rice. Lean meat, poultry, fish, and other protein foods. Low-fat or nonfat dairy. Avoid buying bakery products and other sweets made with whole milk, butter, and eggs. Avoid buying snack foods with added fat, such as anything with butter or cheese flavoring. Cooking Remove skin from poultry, and remove extra fat from meat. Limit the amount of fat and oil you use to 6 tsp (30 mL) or less per day. Cook using low-fat methods, such as boiling, broiling, grilling, steaming, or baking. Use spray oil to cook. Add fat-free chicken broth to add flavor and moisture. Avoid adding cream to thicken soups or sauces. Use other thickeners such as corn starch or tomato paste. Meal planning  Eat a  low-fat diet as told by your dietitian. For most people, this means having no more than 55-65 g of fat each day. Eat small, frequent meals throughout the day. For example, you may have 5 or 6 small meals instead of 3 large meals. Drink enough fluid to keep your urine pale yellow. Do not drink alcohol. Talk to your health care provider if you need help stopping. Limit how much caffeine you have, including black coffee, black and green tea, soft drinks with caffeine, and energy drinks. Plan to include a variety of foods in your diet. Include fruits, vegetables, whole grains and lean proteins, and low-fat or nonfat dairy. You need a balanced diet for good overall health. General information Let your health care provider or dietitian know if you have unplanned weight loss on this eating plan. You may be told to follow a clear liquid or soft food diet when symptoms come back, which is called a flare. Talk with your health care provider about how to manage your diet during symptoms of a flare. Take any vitamins or supplements as told by your health care provider. You may need to take: Extra vitamins that dissolve in fat (are fat soluble), such as vitamins A, D, E, and K. Nutritional supplements. Work with a dietitian, especially if you have other conditions such as obesity, osteoporosis, or diabetes mellitus. Some people need extra treatments, such as: Pills or capsules to replace enzymes (oral pancreatic enzyme replacement therapy). Feedings through a tube in the stomach or intestine (  enteral feedings). What foods should I avoid? Fruits Fried fruits. Fruits served with butter or cream. Vegetables Fried vegetables. Vegetables cooked with butter, cheese, or cream. Grains Biscuits, waffles, donuts, pastries, and croissants. Pies and cookies. Butter-flavored popcorn. Regular crackers. Meats and other proteins Fatty cuts of meat. Poultry with skin. Organ meats. Precooked or cured meat, such as sausages  or meat loaves. Whole eggs. Nuts and nut butters. Dairy Whole and 2% milk. Whole milk yogurt. Whole milk ice cream. Cream and half-and-half. Cheese, such as cream cheese. Sour cream. Beverages Wine, beer, and liquor. The items listed above may not be a complete list of foods and beverages you should avoid. Contact a dietitian for more information. Summary Pancreatitis can affect how your body digests food, especially foods with fat. When you have pancreatitis, it is recommended that you follow a low-fat eating plan to help you recover faster and manage symptoms. Do not drink alcohol. Limit the amount of caffeine you have, and drink enough fluid to keep your urine pale yellow. This information is not intended to replace advice given to you by your health care provider. Make sure you discuss any questions you have with your health care provider. Document Revised: 05/03/2021 Document Reviewed: 05/03/2021 Elsevier Patient Education  2024 Elsevier Inc.  

## 2024-03-17 NOTE — Progress Notes (Signed)
 Subjective:  Patient ID: [name], female    DOB: May 15, 1941  Age: 83 y.o. MRN: [id number]  CC: Medical Management of Chronic Issues   HPI  Discussed the use of AI scribe software for clinical note transcription with the patient, who gave verbal consent to proceed.  History of Present Illness [name] is an 83 year old female with pancreatitis who presents with ongoing abdominal pain and dietary intolerance.  She recently experienced a hospitalization for pancreatitis, during which she had significant abdominal pain and confusion. She describes an episode where she felt like she was in 'a prison' and attributes her disorientation to the administration of morphine . She was advised to avoid NSAIDs such as meloxicam , ibuprofen , and naproxen.  Currently, she continues to experience abdominal pain. She has difficulty swallowing due to a knot near her collarbone, which she associates with a previous back surgery. This knot sometimes changes in size and is tender to touch.  Her diet has been significantly impacted; she attempts to consume chicken broth but feels nauseated after a few spoonfuls. Soda exacerbates her stomach pain. She has started drinking tea and is considering reintroducing small amounts of coffee.  She experienced a sudden episode of diarrhea yesterday, which she attributes to the stool softeners administered during her hospital stay. She has not had frequent diarrhea since then.  She mentions a single episode of consuming alcohol (a beer) and eating grilled cheese sandwiches with tomato, which resulted in severe pain. She acknowledges that cheese, due to its fat content, may have contributed to her symptoms.  Her husband, who is diabetic and described as a 'germaphobic', was left alone during her hospitalization, leading to concerns about his blood sugar management. She expresses worry about his well-being when she is not at home.          03/17/2024    7:58  AM 03/16/2024    1:40 PM 02/25/2024    4:04 PM  Depression screen PHQ 2/9  Decreased Interest 2 1 1   Down, Depressed, Hopeless 2 1 1   PHQ - 2 Score 4 2 2   Altered sleeping 2 1 1   Tired, decreased energy 2 1 1   Change in appetite 2 1 1   Feeling bad or failure about yourself  2 1 1   Trouble concentrating 2 1 1   Moving slowly or fidgety/restless 2 0 1  Suicidal thoughts 1 0 0  PHQ-9 Score 17 7 8   Difficult doing work/chores Somewhat difficult Somewhat difficult Somewhat difficult    History Nakaiya has a past medical history of Acute idiopathic pericarditis (07/15/2023), Agatston coronary artery calcium  score between 200 and 399 (07/15/2023), Arthritis, Atypical chest pain (03/19/2022), Back pain with sciatica (02/21/2021), CHF (congestive heart failure) (HCC), GERD (gastroesophageal reflux disease), Headache(784.0), Hepatitis, History of gout, Hypertension, Hypertensive encephalopathy, 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   She has a past surgical history that includes Abdominal hysterectomy; Anterior cervical corpectomy (03/07/2012); Tonsillectomy; Multiple tooth extractions; Cataract extraction w/PHACO (Left, 04/12/2017); Cataract extraction w/PHACO (Right, 05/03/2017); and LEFT HEART CATH AND CORONARY ANGIOGRAPHY (N/A, 07/11/2023).   Her family history includes Asthma in her daughter; Bipolar disorder in her daughter, daughter, daughter, daughter, and son; Drug abuse in her daughter; Heart disease in her daughter and daughter; Hypertension in her daughter. She was adopted.She reports that she quit smoking about 1 years ago. Her smoking use included cigarettes. She started smoking  about 63 years ago. She has a 30.9 pack-year smoking history. She has been exposed to tobacco smoke. She has never used  smokeless tobacco. She reports that she does not currently use alcohol. She reports that she does not use drugs.    ROS Review of Systems  Constitutional:  Positive for appetite change.  HENT:  Negative for congestion.   Eyes:  Negative for visual disturbance.  Respiratory:  Negative for shortness of breath.   Cardiovascular:  Negative for chest pain.  Gastrointestinal:  Positive for abdominal pain and diarrhea. Negative for constipation, nausea and vomiting.  Genitourinary:  Negative for difficulty urinating.  Musculoskeletal:  Negative for arthralgias and myalgias.  Neurological:  Negative for headaches.  Psychiatric/Behavioral:  Negative for sleep disturbance.     Objective:  BP 131/66   Pulse 92   Temp 98 F (36.7 C)   Ht 5' 1 (1.549 m)   Wt 117 lb 9.6 oz (53.3 kg)   SpO2 95%   BMI 22.22 kg/m   BP Readings from Last 3 Encounters:  03/17/24 131/66  03/13/24 (!) 141/53  02/25/24 (!) 148/75    Wt Readings from Last 3 Encounters:  03/17/24 117 lb 9.6 oz (53.3 kg)  03/08/24 115 lb 15.4 oz (52.6 kg)  02/25/24 117 lb (53.1 kg)     Physical Exam Physical Exam GENERAL: Alert, cooperative, well developed, no acute distress. HEENT: Normocephalic, normal oropharynx, moist mucous membranes. NECK: Tenderness noted. CHEST: Clear to auscultation bilaterally, no wheezes, rhonchi, or crackles. CARDIOVASCULAR: Normal heart rate and rhythm, S1 and S2 normal without murmurs. ABDOMEN: Tender on palpation. EXTREMITIES: No cyanosis or edema. NEUROLOGICAL: Cranial nerves grossly intact, moves all extremities without gross motor or sensory deficit.   Assessment & Plan:  Acute pancreatitis without infection or necrosis, unspecified pancreatitis type -     CBC with Differential/Platelet -     CMP14+EGFR -     Lipase -     Amylase -     CT ABDOMEN PELVIS W CONTRAST; Future -     DG Clavicle Right; Future    Assessment and Plan Assessment & Plan Acute pancreatitis   Acute  pancreatitis presents with significant pancreatic swelling, nausea, pain, and recent diarrhea. Implement a low-fat diet to prevent further inflammation. Avoid alcohol, high-fat foods, and cheese. Consume tea, Gatorade, or water  instead of soda. Prepare chicken without skin or added fat. Order repeat lipase and blood count tests. Schedule a CT scan of the abdomen to evaluate pancreatic inflammation. Provide a handout on a low-fat diet for pancreatitis management. Instruct her to return if vomiting occurs and visit the emergency room if severe. Schedule a follow-up appointment in one week to assess progress.  Dysphagia   Dysphagia with tenderness near the collarbone, possibly related to previous surgery, causes pain when swallowing. Evaluate swallowing difficulty and tenderness near the collarbone. Attempt an x-ray of the collarbone area if the x-ray technician is available. If an x-ray is not possible today, postpone until next week's follow-up.       Follow-up: Return in about 1 week (around 03/24/2024).  Butler Der, M.D.

## 2024-03-18 LAB — CBC WITH DIFFERENTIAL/PLATELET
Basophils Absolute: 0 x10E3/uL (ref 0.0–0.2)
Basos: 0 %
EOS (ABSOLUTE): 0.2 x10E3/uL (ref 0.0–0.4)
Eos: 3 %
Hematocrit: 41 % (ref 34.0–46.6)
Hemoglobin: 12.9 g/dL (ref 11.1–15.9)
Immature Grans (Abs): 0 x10E3/uL (ref 0.0–0.1)
Immature Granulocytes: 0 %
Lymphocytes Absolute: 1.4 x10E3/uL (ref 0.7–3.1)
Lymphs: 27 %
MCH: 30.5 pg (ref 26.6–33.0)
MCHC: 31.5 g/dL (ref 31.5–35.7)
MCV: 97 fL (ref 79–97)
Monocytes Absolute: 0.5 x10E3/uL (ref 0.1–0.9)
Monocytes: 10 %
Neutrophils Absolute: 3.1 x10E3/uL (ref 1.4–7.0)
Neutrophils: 60 %
Platelets: 306 x10E3/uL (ref 150–450)
RBC: 4.23 x10E6/uL (ref 3.77–5.28)
RDW: 12.2 % (ref 11.7–15.4)
WBC: 5.2 x10E3/uL (ref 3.4–10.8)

## 2024-03-18 LAB — CMP14+EGFR
ALT: 5 IU/L (ref 0–32)
AST: 14 IU/L (ref 0–40)
Albumin: 3.5 g/dL — ABNORMAL LOW (ref 3.7–4.7)
Alkaline Phosphatase: 69 IU/L (ref 48–129)
BUN/Creatinine Ratio: 6 — ABNORMAL LOW (ref 12–28)
BUN: 8 mg/dL (ref 8–27)
Bilirubin Total: <0.2 mg/dL (ref 0.0–1.2)
CO2: 24 mmol/L (ref 20–29)
Calcium: 8.8 mg/dL (ref 8.7–10.3)
Chloride: 106 mmol/L (ref 96–106)
Creatinine, Ser: 1.32 mg/dL — ABNORMAL HIGH (ref 0.57–1.00)
Globulin, Total: 2.7 g/dL (ref 1.5–4.5)
Glucose: 107 mg/dL — ABNORMAL HIGH (ref 70–99)
Potassium: 4 mmol/L (ref 3.5–5.2)
Sodium: 146 mmol/L — ABNORMAL HIGH (ref 134–144)
Total Protein: 6.2 g/dL (ref 6.0–8.5)
eGFR: 41 mL/min/1.73 — ABNORMAL LOW (ref >59)

## 2024-03-18 LAB — LIPASE: Lipase: 224 U/L — AB (ref 14–85)

## 2024-03-18 LAB — AMYLASE: Amylase: 110 U/L (ref 31–110)

## 2024-03-20 ENCOUNTER — Other Ambulatory Visit: Payer: Self-pay

## 2024-03-20 NOTE — Patient Outreach (Signed)
 Complex Care Management   Visit Note  03/20/2024  Name:  [name] MRN: [id number] DOB: 01-18-1941  Situation: Referral received for Complex Care Management related to SDOH Barriers:  Transportation I obtained verbal consent from Patient.  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 Patient also uses [facility] but was told she didn't call with enough advanced notice. Friends charge $20 for transportation. SW suggest speaking to friends at church to carpool for grocery shopping. Patient reports no family in the state. SW t/c [facility] to educate on Friday shopping trip. Patient agreed [facility] will be a better option for going to the bank and grocery shopping. Patient will call next week to schedule with [facility].   SDOH Interventions    Flowsheet Row Patient Outreach Telephone from 03/20/2024 in [place] [facility] POPULATION HEALTH DEPARTMENT Office Visit from 03/17/2024 in [facility] Telephone from 03/16/2024 in [place] [facility] POPULATION HEALTH DEPARTMENT Office Visit from 02/25/2024 in [facility] Patient Outreach Telephone from 01/07/2024 in [place] POPULATION HEALTH DEPARTMENT Patient Outreach Telephone from 12/02/2023 in [place] POPULATION HEALTH DEPARTMENT  SDOH Interventions        Food Insecurity Interventions -- -- Intervention Not Indicated -- Intervention Not Indicated --  Housing Interventions -- -- Intervention Not Indicated -- Intervention Not Indicated --  Transportation Interventions -- -- Intervention Not Indicated, AMB Referral  [per patint she has transportation to appointment. She just needs someone to take her to do hopping sometimes. Patient is going to contact scat services] -- Intervention Not Indicated --  Utilities Interventions -- -- Intervention Not Indicated -- Intervention Not Indicated --  Depression Interventions/Treatment  -- Counseling Counseling Counseling -- Counseling  Financial Strain Interventions Intervention Not Indicated -- -- -- -- --      Recommendation:   None  Follow Up Plan:   Telephone follow up appointment date/time:  04/03/24 at 9am  Tillman Gardener, BSW [place]  [facility], [facility] Social Worker Direct Dial: [phone]  Fax: [phone] Website: [email or web address]

## 2024-03-20 NOTE — Patient Instructions (Signed)
 Visit Information  Thank you for taking time to visit with me today. Please don't hesitate to contact me if I can be of assistance to you before our next scheduled appointment.  Our next appointment is by telephone on 04/03/24 at 9am Please call the care guide team at [phone] if you need to cancel or reschedule your appointment.   Following is a copy of your care plan:   Goals Addressed             This Visit's Progress    BSW VBCI Social Work Care Plan       Problems:   Transportation  CSW Clinical Goal(s):   Over the next 2 weeks the Patient will will follow up with [employer] and [employer] friends as directed by Social Work.  Interventions:  Social Determinants of Health in Patient with CKD Stage 3, COPD, and HTN: SDOH assessments completed: Transportation Evaluation of current treatment plan related to unmet needs Patient reports she uses [facility] rides for transportation but they often no show.  Patient also uses [employer] but was told she didn't call with enough advanced notice.  Friends charge $20 for transportation.  SW suggest speaking to friends at church to carpool for grocery shopping.  Patient reports no family in the state.  SW t/c [employer] to educate on Friday shopping trip.  Patient agreed [employer] will be a better option for going to the bank and grocery shopping.  Patient will call next week to schedule with [employer].  Patient Goals/Self-Care Activities:  Patient will work with [employer] and friends from church for additional transportation options.  Plan:   Telephone follow up appointment with care management team member scheduled for:  04/03/24 at 9am.        Please call 911 if you are experiencing a Mental Health or Behavioral Health Crisis or need someone to talk to.  Patient verbalized understanding of Care plan and visit instructions communicated this visit  Tillman Gardener, BSW [place]  [facility] Social Worker Direct  Dial: [phone]  Fax: [phone] Website: [email or web address]

## 2024-03-24 ENCOUNTER — Other Ambulatory Visit: Payer: Self-pay | Admitting: *Deleted

## 2024-03-24 NOTE — Patient Instructions (Signed)
 Visit Information  Thank you for taking time to visit with me today. Please don't hesitate to contact me if I can be of assistance to you before our next scheduled telephone appointment.  Our next appointment is by telephone on 04/01/24 @ 930 am  Following is a copy of your care plan:   Goals Addressed             This Visit's Progress    VBCI Transitions of Care (TOC) Care Plan       Problems:  Recent Hospitalization for treatment of Acute Pancreatitis Knowledge Deficit Related to Acute Pancreatitis 03/24/24- pt reports  I'm doing well,  no new concerns reported  Goal:  Over the next 30 days, the patient will not experience hospital readmission  Interventions:  Transitions of Care: Doctor Visits  - discussed the importance of doctor visits Reviewed Signs and symptoms of infection and pancreatitis Reviewed diet/ food choices, importance of limiting fatty foods Reviewed safety precautions, energy conservation  Patient Self Care Activities:  Attend all scheduled provider appointments Call pharmacy for medication refills 3-7 days in advance of running out of medications Notify RN Care Manager of TOC call rescheduling needs Participate in Transition of Care Program/Attend TOC scheduled calls Perform all self care activities independently  Perform IADL's (shopping, preparing meals, housekeeping, managing finances) independently Take medications as prescribed    Plan:  Next PCP appointment scheduled for: 03/25/24 Telephone follow up appointment with care management team member scheduled for:  04/01/24 @ 930 am SW [place] Clemons 04/03/24        Care plan and visit instructions communicated with the patient verbally today. The patient DECLINED to receive copy of care plan and patient instructions in any format.   Telephone follow up appointment with care management team member scheduled for: 04/01/24 @ 930 am  Please call the care guide team at [phone] if you need to  cancel or reschedule your appointment.   Please call the Suicide and Crisis Lifeline: 988 call the [place]  National Suicide Prevention Lifeline: (240)559-1378 or TTY: 805-581-6768 TTY 508-779-4984) to talk to a trained counselor call 1-800-273-TALK (toll free, 24 hour hotline) go to [facility] [address] [place] [phone]) call the [facility] Crisis Line: [phone] call 911 if you are experiencing a Mental Health or Behavioral Health Crisis or need someone to talk to.  Mliss Creed [facility], BSN RN Care Manager/ Transition of Care [place]/ [facility] [phone]

## 2024-03-24 NOTE — Patient Outreach (Signed)
 Transition of Care week 2  Visit Note  03/24/2024  Name: [name] MRN: [id number]          DOB: 1941-02-16  Situation: Patient enrolled in [facility] 30-day program. Visit completed with patient by telephone.   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   Ileus (HCC) 05/15/2021   Influenza A 07/15/2023   NSTEMI (non-ST elevated myocardial infarction) (HCC) 07/10/2023   Stroke (HCC)    SLIGHT RT SIDE WEAKNESS 2001   Unintentional weight loss 03/19/2022    Assessment: Patient Reported Symptoms: Cognitive Cognitive Status: No symptoms reported, Able to follow simple commands, Alert and oriented to person, place, and time, Normal speech and language skills   Health Maintenance Behaviors: Annual physical exam Healing Pattern: Average Health Facilitated by: Rest, Pain control  Neurological Neurological Review of Symptoms: No symptoms reported    HEENT HEENT Symptoms Reported: No symptoms reported      Cardiovascular Cardiovascular Symptoms Reported: No symptoms reported    Respiratory Respiratory Symptoms Reported: No symptoms reported    Endocrine Endocrine Symptoms Reported: No symptoms reported    Gastrointestinal Gastrointestinal Symptoms Reported: No symptoms reported      Genitourinary Genitourinary Symptoms Reported: No symptoms reported    Integumentary Integumentary Symptoms Reported: No symptoms  reported    Musculoskeletal Musculoskelatal Symptoms Reviewed: Weakness Musculoskeletal Management Strategies: Adequate rest, Medication therapy Musculoskeletal Comment: has cane on hand if needed      Psychosocial Psychosocial Symptoms Reported: No symptoms reported         There were no vitals filed for this visit.  Medications Reviewed Today     Reviewed by Aura Mliss LABOR, RN (Registered Nurse) on 03/24/24 at 1326  Med List Status: <None>   Medication Order Taking? Sig Documenting Provider Last Dose Status Informant  acetaminophen  (TYLENOL ) 500 MG tablet [id number]  Take 2 tablets (1,000 mg total) by mouth 3 (three) times daily.  Patient taking differently: Take 250 mg by mouth 2 (two) times daily as needed for moderate pain (pain score 4-6).   Zollie Lowers, MD  Active Self, Pharmacy Records  albuterol  (VENTOLIN  HFA) 108 [phone] Base) MCG/ACT inhaler [id number]  Inhale 2 puffs into the lungs every 4 (four) hours as needed for wheezing or shortness of breath. Pearlean Manus, MD  Active   alendronate  (FOSAMAX ) 70 MG tablet [id number]  Take 1 tablet (70 mg total) by mouth every 7 (seven) days. Take with a full glass of water  on an empty stomach. Zollie Lowers, MD  Active Self, Pharmacy Records           Med Note [place], [place]   Tue Mar 10, 2024  2:40 PM) Pt has not started  amLODipine -olmesartan  (AZOR ) 10-40 MG tablet [id number]  Take 1 tablet by mouth daily. For blood pressure. Pearlean Manus, MD  Active   atorvastatin  (LIPITOR ) 80 MG tablet [id number]  Take 1 tablet (  80 mg total) by mouth daily. Vannie Reche RAMAN, NP  Active Self, Pharmacy Records  cloNIDine  (CATAPRES ) 0.2 MG tablet [id number]  Take 1.5 tablets morning and evening, and 1 tablet mid-day. Raford Riggs, MD  Active Self, Pharmacy Records  clopidogrel  (PLAVIX ) 75 MG tablet [id number]  Take 1 tablet (75 mg total) by mouth daily. Pearlean Manus, MD  Active   fluticasone  furoate-vilanterol (BREO ELLIPTA ) 200-25  MCG/ACT AEPB [id number]  Inhale 1 puff into the lungs daily. Pearlean Manus, MD  Active   furosemide  (LASIX ) 20 MG tablet [id number]  TAKE 1 TABLET (20 MG TOTAL) BY MOUTH DAILY. FOR SWELLING Raford Riggs, MD  Active Self, Pharmacy Records  isosorbide  mononitrate (IMDUR ) 60 MG 24 hr tablet [id number]  Take 1 tablet (60 mg total) by mouth daily. Pearlean Manus, MD  Active   Magnesium  250 MG TABS [id number]  Take 1 tablet (250 mg total) by mouth daily. Zollie Lowers, MD  Active Self, Pharmacy Records           Med Note [place], [facility]   Mon Nov 04, 2023  9:27 AM) Per patient, takes q 2-3 days  neomycin -polymyxin-hydrocortisone (CORTISPORIN) 3.5-10000-1 ophthalmic suspension [id number]  Place 3 drops into both eyes 4 (four) times daily. Zollie Lowers, MD  Active Self, Pharmacy Records           Med Note [place]   Tue Mar 10, 2024  2:40 PM) Pt states she is still using medication  pantoprazole  (PROTONIX ) 40 MG tablet [id number]  Take 1 tablet (40 mg total) by mouth daily. Pearlean Manus, MD  Active   potassium chloride  SA (KLOR-CON  M) 20 MEQ tablet [id number]  Take 1 tablet (20 mEq total) by mouth daily. For potassium replacement/ supplement Zollie Lowers, MD  Active Self, Pharmacy Records  pregabalin  (LYRICA ) 300 MG capsule [id number]  Take 1 capsule (300 mg total) by mouth at bedtime. To reduce back pain Zollie Lowers, MD  Active Self, Pharmacy Records  rOPINIRole  (REQUIP ) 1 MG tablet [id number]  TAKE 1 TABLET (1 MG TOTAL) BY MOUTH AT BEDTIME. FOR LEG CRAMPS Zollie Lowers, MD  Active Self, Pharmacy Records  tizanidine  (ZANAFLEX ) 2 MG capsule [id number]  Take 2 mg by mouth at bedtime. [provider]  Active Self, Pharmacy Records  vitamin B-12 1000 MCG tablet [id number]  Take 1 tablet (1,000 mcg total) by mouth daily. Vicci Afton CROME, MD  Active Self, Pharmacy Records            Goals Addressed             This Visit's Progress    VBCI Transitions of Care (TOC)  Care Plan       Problems:  Recent Hospitalization for treatment of Acute Pancreatitis Knowledge Deficit Related to Acute Pancreatitis 03/24/24- pt reports  I'm doing well,  no new concerns reported  Goal:  Over the next 30 days, the patient will not experience hospital readmission  Interventions:  Transitions of Care: Doctor Visits  - discussed the importance of doctor visits Reviewed Signs and symptoms of infection and pancreatitis Reviewed diet/ food choices, importance of limiting fatty foods Reviewed safety precautions, energy conservation  Patient Self Care Activities:  Attend all scheduled provider appointments Call pharmacy for medication refills 3-7 days in advance of running out of medications Notify RN Care Manager of TOC call rescheduling needs Participate in Transition of Care Program/Attend TOC scheduled calls Perform all self care activities independently  Perform IADL's (shopping, preparing meals, housekeeping, managing finances) independently Take medications  as prescribed    Plan:  Next PCP appointment scheduled for: 03/25/24 Telephone follow up appointment with care management team member scheduled for:  04/01/24 @ 930 am SW [place] Clemons 04/03/24         Recommendation:   PCP Follow-up  Follow Up Plan:   Telephone follow-up 04/01/24 @ 930 am  Mliss Creed [facility], BSN RN Care Manager/ Transition of Care [place]/ [facility] Population [facility] [phone]

## 2024-03-25 ENCOUNTER — Ambulatory Visit: Payer: Self-pay | Admitting: Family Medicine

## 2024-03-25 ENCOUNTER — Ambulatory Visit: Admitting: Family Medicine

## 2024-03-26 ENCOUNTER — Encounter: Payer: Self-pay | Admitting: Family Medicine

## 2024-04-01 ENCOUNTER — Other Ambulatory Visit: Admitting: *Deleted

## 2024-04-01 NOTE — Patient Instructions (Signed)
 Visit Information  Thank you for taking time to visit with me today. Please don't hesitate to contact me if I can be of assistance to you before our next scheduled telephone appointment.  Our next appointment is by telephone on 04/08/24 @ 1030 am  Following is a copy of your care plan:   Goals Addressed             This Visit's Progress    VBCI Transitions of Care (TOC) Care Plan       Problems:  Recent Hospitalization for treatment of Acute Pancreatitis Knowledge Deficit Related to Acute Pancreatitis 03/24/24- pt reports  I'm doing well,  no new concerns reported 04/01/24- pt reports she went to ED 11/2 for new onset pain right foot 5th digit and discoloration, xrays completed, pt is wearing off loading shoe and to see podiatrist tomorrow 04/02/24, pt states  they don't know what it is  Goal:  Over the next 30 days, the patient will not experience hospital readmission  Interventions:  Transitions of Care: Doctor Visits  - discussed the importance of doctor visits Reviewed Signs and symptoms of infection and pancreatitis Reviewed diet/ food choices, importance of limiting fatty foods Reinforced safety precautions, energy conservation Reviewed signs/ symptoms infection Pain assessment completed- reviewed pain management interventions, relaxation, adequate rest, keeping stress to a minimum, take tylenol  as prescribed  Patient Self Care Activities:  Attend all scheduled provider appointments Call pharmacy for medication refills 3-7 days in advance of running out of medications Notify RN Care Manager of TOC call rescheduling needs Participate in Transition of Care Program/Attend TOC scheduled calls Perform all self care activities independently  Perform IADL's (shopping, preparing meals, housekeeping, managing finances) independently Take medications as prescribed    Plan:  Telephone follow up appointment with care management team member scheduled for:  04/08/24 @ 1030 AM SW  [place] Clemons 04/03/24        Care plan and visit instructions communicated with the patient verbally today. The patient DECLINED to receive copy of care plan and patient instructions in any format.   Telephone follow up appointment with care management team member scheduled for: 04/08/24 @ 1030 am  Please call the care guide team at [phone] if you need to cancel or reschedule your appointment.   Please call the Suicide and Crisis Lifeline: 988 call the [place]  National Suicide Prevention Lifeline: (732) 140-6391 or TTY: 513-726-8806 TTY (365) 534-4595) to talk to a trained counselor call 1-800-273-TALK (toll free, 24 hour hotline) go to [facility] [address] [place] [phone]) call the [facility] Crisis Line: [phone] call 911 if you are experiencing a Mental Health or Behavioral Health Crisis or need someone to talk to.  Mliss Creed [facility], BSN RN Care Manager/ Transition of Care [place]/ [facility] [phone]

## 2024-04-01 NOTE — Patient Outreach (Signed)
 Transition of Care week 3  Visit Note  04/01/2024  Name: [name] MRN: [id number]          DOB: 1941/05/17  Situation: Patient enrolled in [facility] 30-day program. Visit completed with patient by telephone.   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   Ileus (HCC) 05/15/2021   Influenza A 07/15/2023   NSTEMI (non-ST elevated myocardial infarction) (HCC) 07/10/2023   Stroke (HCC)    SLIGHT RT SIDE WEAKNESS 2001   Unintentional weight loss 03/19/2022    Assessment: Patient Reported Symptoms: Cognitive Cognitive Status: No symptoms reported, Able to follow simple commands, Alert and oriented to person, place, and time, Normal speech and language skills      Neurological Neurological Review of Symptoms: No symptoms reported    HEENT HEENT Symptoms Reported: No symptoms reported      Cardiovascular Cardiovascular Symptoms Reported: No symptoms reported    Respiratory Respiratory Symptoms Reported: No symptoms reported    Endocrine Endocrine Symptoms Reported: No symptoms reported Is patient diabetic?: No    Gastrointestinal Gastrointestinal Symptoms Reported: No symptoms reported      Genitourinary Genitourinary Symptoms Reported: No symptoms reported    Integumentary Integumentary Symptoms Reported: Skin changes Additional Integumentary Details: discoloration to right 5th digit, pt went to ED 11/2 with  new onset pain, discoloration, xrays completed, pt states  they don't know what it is  pt to see podiatrist Dr. Roddie 04/02/24 for evaluation, pt states she is wearing off loading shoe Skin Management Strategies: Adequate rest, Routine screening, Medication therapy Skin Self-Management Outcome: 3 (uncertain) Skin Comment: reviewed signs/ symptoms of infection,  Musculoskeletal Musculoskelatal Symptoms Reviewed: Weakness Additional Musculoskeletal Details: reviewed safety precautions Musculoskeletal Management Strategies: Adequate rest, Medication therapy Musculoskeletal Comment: has cane on hand if needed      Psychosocial Psychosocial Symptoms Reported: No symptoms reported         There were no vitals filed for this visit.  Medications Reviewed Today     Reviewed by Aura Mliss LABOR, RN (Registered Nurse) on 04/01/24 at [phone]  Med List Status: <None>   Medication Order Taking? Sig Documenting Provider Last Dose Status Informant  acetaminophen  (TYLENOL ) 500 MG tablet [id number]  Take 2 tablets (1,000 mg total) by mouth 3 (three) times daily.  Patient taking differently: Take 250 mg by mouth 2 (two) times daily as needed for moderate pain (pain score 4-6).   Zollie Lowers, MD  Active Self, Pharmacy Records  albuterol  (VENTOLIN  HFA) 108 [phone] Base) MCG/ACT inhaler [id number]  Inhale 2 puffs into the lungs every 4 (four) hours as needed for wheezing or shortness of breath. Pearlean Manus, MD  Active   alendronate  (FOSAMAX ) 70 MG tablet [id number]  Take 1 tablet (70 mg total) by mouth every 7 (seven) days. Take with a full glass of water  on an empty stomach. Zollie Lowers, MD  Active Self, Pharmacy Records  Med Note [place], [place]   Tue Mar 10, 2024  2:40 PM) Pt has not started  amLODipine -olmesartan  (AZOR ) 10-40 MG tablet [id number]  Take 1 tablet by mouth daily. For blood pressure. Pearlean Manus, MD  Active   atorvastatin  (LIPITOR ) 80 MG tablet [id number]  Take 1 tablet (80  mg total) by mouth daily. Vannie Reche RAMAN, NP  Active Self, Pharmacy Records  cloNIDine  (CATAPRES ) 0.2 MG tablet [id number]  Take 1.5 tablets morning and evening, and 1 tablet mid-day. Raford Riggs, MD  Active Self, Pharmacy Records  clopidogrel  (PLAVIX ) 75 MG tablet [id number]  Take 1 tablet (75 mg total) by mouth daily. Pearlean Manus, MD  Active   fluticasone  furoate-vilanterol (BREO ELLIPTA ) 200-25 MCG/ACT AEPB [id number]  Inhale 1 puff into the lungs daily. Pearlean Manus, MD  Active   furosemide  (LASIX ) 20 MG tablet [id number]  TAKE 1 TABLET (20 MG TOTAL) BY MOUTH DAILY. FOR SWELLING Raford Riggs, MD  Active Self, Pharmacy Records  isosorbide  mononitrate (IMDUR ) 60 MG 24 hr tablet [id number]  Take 1 tablet (60 mg total) by mouth daily. Pearlean Manus, MD  Active   Magnesium  250 MG TABS [id number]  Take 1 tablet (250 mg total) by mouth daily. Zollie Lowers, MD  Active Self, Pharmacy Records           Med Note [place], [facility]   Mon Nov 04, 2023  9:27 AM) Per patient, takes q 2-3 days  neomycin -polymyxin-hydrocortisone (CORTISPORIN) 3.5-10000-1 ophthalmic suspension [id number]  Place 3 drops into both eyes 4 (four) times daily. Zollie Lowers, MD  Active Self, Pharmacy Records           Med Note [place], [place]   Tue Mar 10, 2024  2:40 PM) Pt states she is still using medication  pantoprazole  (PROTONIX ) 40 MG tablet [id number]  Take 1 tablet (40 mg total) by mouth daily. Pearlean Manus, MD  Active   potassium chloride  SA (KLOR-CON  M) 20 MEQ tablet [id number]  Take 1 tablet (20 mEq total) by mouth daily. For potassium replacement/ supplement Zollie Lowers, MD  Active Self, Pharmacy Records  pregabalin  (LYRICA ) 300 MG capsule [id number]  Take 1 capsule (300 mg total) by mouth at bedtime. To reduce back pain Zollie Lowers, MD  Active Self, Pharmacy Records  rOPINIRole  (REQUIP ) 1 MG tablet [id number]  TAKE 1 TABLET (1 MG TOTAL) BY MOUTH AT BEDTIME. FOR LEG CRAMPS Zollie Lowers, MD   Active Self, Pharmacy Records  tizanidine  (ZANAFLEX ) 2 MG capsule [id number]  Take 2 mg by mouth at bedtime. [provider]  Active Self, Pharmacy Records  vitamin B-12 1000 MCG tablet [id number]  Take 1 tablet (1,000 mcg total) by mouth daily. Vicci Afton CROME, MD  Active Self, Pharmacy Records            Goals Addressed             This Visit's Progress    VBCI Transitions of Care (TOC) Care Plan       Problems:  Recent Hospitalization for treatment of Acute Pancreatitis Knowledge Deficit Related to Acute Pancreatitis 03/24/24- pt reports  I'm doing well,  no new concerns reported 04/01/24- pt reports she went to ED 11/2 for new onset pain right foot 5th digit and discoloration, xrays completed, pt is wearing off loading shoe and to see podiatrist tomorrow 04/02/24, pt states  they don't know what it is  Goal:  Over the next 30 days, the patient will not experience hospital readmission  Interventions:  Transitions of Care:  Doctor Visits  - discussed the importance of doctor visits Reviewed Signs and symptoms of infection and pancreatitis Reviewed diet/ food choices, importance of limiting fatty foods Reinforced safety precautions, energy conservation Reviewed signs/ symptoms infection Pain assessment completed- reviewed pain management interventions, relaxation, adequate rest, keeping stress to a minimum, take tylenol  as prescribed  Patient Self Care Activities:  Attend all scheduled provider appointments Call pharmacy for medication refills 3-7 days in advance of running out of medications Notify RN Care Manager of TOC call rescheduling needs Participate in Transition of Care Program/Attend TOC scheduled calls Perform all self care activities independently  Perform IADL's (shopping, preparing meals, housekeeping, managing finances) independently Take medications as prescribed    Plan:  Telephone follow up appointment with care management team member  scheduled for:  04/08/24 @ 1030 AM SW [place] Clemons 04/03/24        Recommendation:   PCP Follow-up Specialty provider follow-up podiatrist 04/02/24  Follow Up Plan:   Telephone follow-up 04/08/24 @ 1030 am  Mliss Creed [facility], BSN RN Care Manager/ Transition of Care [place]/ [facility] [phone]

## 2024-04-03 ENCOUNTER — Telehealth: Payer: Self-pay | Admitting: Family Medicine

## 2024-04-03 ENCOUNTER — Other Ambulatory Visit: Payer: Self-pay

## 2024-04-03 NOTE — Telephone Encounter (Signed)
 Copied from CRM [id number]. Topic: Appointments - Appointment Info/Confirmation >> Apr 03, 2024  3:55 PM Montie POUR wrote: Patient/patient representative is calling for information regarding an appointment.  She had to cancel her appointment on 03/25/24 because she has to walk to her appointment and was unable to walk that day. Her pancreatitis was acting up that day. Please call Ms. Rappleye to discuss at [phone].

## 2024-04-03 NOTE — Patient Instructions (Signed)

## 2024-04-03 NOTE — Telephone Encounter (Signed)
 Left message making patient aware that her appt has been rescheduled for 11/10 at 1:55 PM with Dr Zollie. Also noted in the chart DO NOT SEND NO SHOW- Patient had to cancel her appointment on 03/25/24 because she has to walk to her appointment and was unable to walk that day. Her pancreatitis was acting up that day.

## 2024-04-03 NOTE — Patient Outreach (Signed)
 Social Drivers of Health  Community Resource and Care Coordination Visit Note   04/03/2024  Name: [name] MRN: [id number] DOB:02-Apr-1941  Situation: Referral received for [facility] needs assessment and assistance related to Transportation. I obtained verbal consent from Patient.  Visit completed with Patient on the phone.   Background:   SDOH Interventions Today    Flowsheet Row Most Recent Value  SDOH Interventions   Transportation Interventions Other (Comment)  [Patient is now accessing [facility] for grocery shopping and errands]     Assessment:   Goals Addressed             This Visit's Progress    BSW Goal       Current SDOH Barriers:  Transportation  Interventions: Collaborated with [facility] (community agency) re: transportation for errands and appointments          Recommendation:   Patient to contact [facility] for transportation for appointments and errands.  Follow Up Plan:   Patient has achieved all patient stated goals. [employer] will be closed. Patient has been provided contact information should new needs arise.   Tillman Gardener, BSW [place]  [facility], [facility] Social Worker Direct Dial: [phone]  Fax: [phone] Website: [email or web address]

## 2024-04-06 ENCOUNTER — Ambulatory Visit (INDEPENDENT_AMBULATORY_CARE_PROVIDER_SITE_OTHER): Admitting: Family Medicine

## 2024-04-06 VITALS — BP 130/76 | HR 93 | Temp 97.0°F | Wt 112.0 lb

## 2024-04-06 DIAGNOSIS — I96 Gangrene, not elsewhere classified: Secondary | ICD-10-CM | POA: Diagnosis not present

## 2024-04-06 DIAGNOSIS — I739 Peripheral vascular disease, unspecified: Secondary | ICD-10-CM

## 2024-04-06 MED ORDER — OXYCODONE HCL 10 MG PO TABS: 10.0000 mg | ORAL_TABLET | Freq: Four times a day (QID) | ORAL | 0 refills | Status: AC | PRN

## 2024-04-06 NOTE — Progress Notes (Unsigned)
 Subjective:  Patient ID: [name], female    DOB: 1940-10-10  Age: 83 y.o. MRN: [id number]  CC: Foot Injury (Right foot has not circulation and turn black. In a lot of pain. Recently  went to hospital.) and Weight Loss (No appetite for the past month.)   HPI  Discussed the use of AI scribe software for clinical note transcription with the patient, who gave verbal consent to proceed.  History of Present Illness  Has a lot of pain in the right foot that is making it difficult for her to walk. Seen by podiatry. Told foot had poor circulation and possibly gangrene. She was told to come here for further evaluation. She has prior history of PAD. She has left renal artery stenosis as well.          04/06/2024    1:54 PM 03/17/2024    7:58 AM 03/16/2024    1:40 PM  Depression screen PHQ 2/9  Decreased Interest 1 2 1   Down, Depressed, Hopeless 1 2 1   PHQ - 2 Score 2 4 2   Altered sleeping 1 2 1   Tired, decreased energy 1 2 1   Change in appetite 1 2 1   Feeling bad or failure about yourself  1 2 1   Trouble concentrating 1 2 1   Moving slowly or fidgety/restless 1 2 0  Suicidal thoughts 1 1 0  PHQ-9 Score 9 17  7    Difficult doing work/chores  Somewhat difficult Somewhat difficult     Data saved with a previous flowsheet row definition    History [name] has a past medical history of Acute idiopathic pericarditis (07/15/2023), Agatston coronary artery calcium  score between 200 and 399 (07/15/2023), Arthritis, Atypical chest pain (03/19/2022), Back pain with sciatica (02/21/2021), CHF (congestive heart failure) (HCC), GERD (gastroesophageal reflux disease), Headache(784.0), Hepatitis, History of gout, Hypertension, Hypertensive encephalopathy, 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   She  has a past surgical history that includes Abdominal hysterectomy; Anterior cervical corpectomy (03/07/2012); Tonsillectomy; Multiple tooth extractions; Cataract extraction w/PHACO (Left, 04/12/2017); Cataract extraction w/PHACO (Right, 05/03/2017); and LEFT HEART CATH AND CORONARY ANGIOGRAPHY (N/A, 07/11/2023).   Her family history includes Asthma in her daughter; Bipolar disorder in her daughter, daughter, daughter, daughter, and son; Drug abuse in her daughter; Heart disease in her daughter and daughter; Hypertension in her daughter. She was adopted.She reports that she quit smoking about 2 years ago. Her smoking use included cigarettes. She started smoking about 63 years ago. She has a 30.9 pack-year smoking history. She has been exposed to tobacco smoke. She has never used smokeless tobacco. She reports that she does not currently use alcohol. She reports that she does not use drugs.    ROS Review of Systems  Constitutional:  Positive for activity change.  HENT: Negative.    Eyes:  Negative for visual disturbance.  Respiratory:  Negative for shortness of breath.   Cardiovascular:  Negative for chest pain.  Gastrointestinal:  Negative for abdominal pain.  Musculoskeletal:  Negative for arthralgias.    Objective:  BP 130/76   Pulse 93   Temp (!) 97 F (36.1 C)   Wt 112 lb (50.8 kg)   SpO2 94%   BMI 21.16 kg/m   BP Readings from Last 3 Encounters:  04/06/24 130/76  03/17/24 131/66  03/13/24 (!) 141/53    Wt Readings from Last 3 Encounters:  04/06/24 112  lb (50.8 kg)  03/17/24 117 lb 9.6 oz (53.3 kg)  03/08/24 115 lb 15.4 oz (52.6 kg)     Physical Exam Constitutional:      General: She is not in acute distress.    Appearance: She is well-developed.  Cardiovascular:     Rate and Rhythm: Normal rate and regular rhythm.  Pulmonary:     Breath sounds: Normal breath sounds.  Genitourinary:    Rectum: Guaiac result positive.  Musculoskeletal:        General: Normal range of  motion.  Skin:    General: Skin is warm and dry.     Findings: Lesion (right fifth toe has black  discoloration slight red about the margins remains.) present.  Neurological:     Mental Status: She is alert and oriented to person, place, and time.    Physical Exam    Assessment & Plan:  PAD (peripheral artery disease)  Gangrene (HCC)  Other orders -     oxyCODONE  HCl; Take 1 tablet (10 mg total) by mouth 4 (four) times daily as needed for up to 5 days (for severe pain only).  Dispense: 20 tablet; Refill: 0    Assessment and Plan Assessment & Plan   Pt. Has referral to vascular already. Visit scheduled for 2 days. She should stay off the foot. Do not elevate. Meds for pain prescribed above.     Follow-up: Return in about 2 weeks (around 04/20/2024).  Butler Der, M.D.

## 2024-04-07 ENCOUNTER — Encounter: Payer: Self-pay | Admitting: Family Medicine

## 2024-04-08 ENCOUNTER — Other Ambulatory Visit: Payer: Self-pay | Admitting: *Deleted

## 2024-04-08 ENCOUNTER — Ambulatory Visit (HOSPITAL_COMMUNITY)
Admission: RE | Admit: 2024-04-08 | Discharge: 2024-04-08 | Disposition: A | Discharge status: Home / Self Care | Source: Ambulatory Visit | Attending: Surgery | Admitting: Surgery

## 2024-04-08 DIAGNOSIS — I739 Peripheral vascular disease, unspecified: Secondary | ICD-10-CM | POA: Insufficient documentation

## 2024-04-08 LAB — VAS US ABI WITH/WO TBI
Left ABI: 0.88
Right ABI: 0.41

## 2024-04-08 NOTE — Patient Outreach (Signed)
 Transition of Care week 4  Visit Note  04/08/2024  Name: [name] MRN: [id number]          DOB: [birth year]  Situation: Patient enrolled in [facility] 30-day program. Visit completed with patient by telephone.   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   Ileus (HCC) 05/15/2021   Influenza A 07/15/2023   NSTEMI (non-ST elevated myocardial infarction) (HCC) 07/10/2023   Stroke (HCC)    SLIGHT RT SIDE WEAKNESS 2001   Unintentional weight loss 03/19/2022    Assessment: Patient Reported Symptoms: Cognitive Cognitive Status: Alert and oriented to person, place, and time, Normal speech and language skills, Able to follow simple commands, No symptoms reported      Neurological Neurological Review of Symptoms: No symptoms reported    HEENT HEENT Symptoms Reported: No symptoms reported      Cardiovascular Cardiovascular Symptoms Reported: No symptoms reported    Respiratory Respiratory Symptoms Reported: No symptoms reported    Endocrine Endocrine Symptoms Reported: No symptoms reported Is patient diabetic?: No    Gastrointestinal Gastrointestinal Symptoms Reported: No symptoms reported      Genitourinary Genitourinary Symptoms Reported: No symptoms reported    Integumentary Integumentary Symptoms Reported: Skin changes Additional Integumentary Details: discoloration to right 5th digit, followed up with primary  care provider 11/10, will be seeing vascular today 11/12, pt is wearing off loading shoe Skin Management Strategies: Adequate rest, Routine screening Skin Self-Management Outcome: 3 (uncertain) Skin Comment: reinforced signs/ symptoms of infection  Musculoskeletal Musculoskelatal Symptoms Reviewed: Weakness Additional Musculoskeletal Details: reinforced safety precautions Musculoskeletal Management Strategies: Adequate rest Musculoskeletal Self-Management Outcome: 4 (good) Musculoskeletal Comment: has cane on hand if needed      Psychosocial Psychosocial Symptoms Reported: No symptoms reported         There were no vitals filed for this visit. Pain Scale: 0-10 Pain Score: 0-No pain  Medications Reviewed Today     Reviewed by Aura Mliss LABOR, RN (Registered Nurse) on 04/08/24 at 1042  Med List Status: <None>   Medication Order Taking? Sig Documenting Provider Last Dose Status Informant  acetaminophen  (TYLENOL ) 500 MG tablet [id number]  Take 2 tablets (1,000 mg total) by mouth 3 (three) times daily. Zollie Lowers, MD  Active Self, Pharmacy Records  albuterol  (VENTOLIN  HFA) 108 [phone] Base) MCG/ACT inhaler [id number]  Inhale 2 puffs into the lungs every 4 (four) hours as needed for wheezing or shortness of breath. Pearlean Manus, MD  Active   alendronate  (FOSAMAX ) 70 MG tablet [id number]  Take 1 tablet (70 mg total) by mouth every 7 (seven) days. Take with a full glass of water  on an empty stomach. Zollie Lowers, MD  Active Self, Pharmacy Records           Med Note [place], [place]   Tue Mar 10, 2024  2:40 PM) Pt has not started  amLODipine -olmesartan  (AZOR ) 10-40 MG tablet [id number]  Take 1 tablet by mouth daily. For blood pressure. Pearlean Manus, MD  Active   atorvastatin  (LIPITOR ) 80 MG tablet [id number]  Take 1 tablet (80 mg total) by mouth daily. Vannie Reche RAMAN, NP  Active Self, Pharmacy Records  cloNIDine  (CATAPRES ) 0.2 MG tablet [id number]  Take 1.5 tablets morning and  evening, and 1 tablet mid-day. Raford Riggs, MD  Active Self, Pharmacy Records  clopidogrel  (PLAVIX ) 75 MG tablet [id number]  Take 1 tablet (75 mg total) by mouth daily. Pearlean Manus, MD  Active   fluticasone  furoate-vilanterol (BREO ELLIPTA ) 200-25 MCG/ACT AEPB [id number]  Inhale 1 puff into the lungs daily. Pearlean Manus, MD  Active   furosemide  (LASIX ) 20 MG tablet [id number]  TAKE 1 TABLET (20 MG TOTAL) BY MOUTH DAILY. FOR SWELLING Raford Riggs, MD  Active Self, Pharmacy Records  isosorbide  mononitrate (IMDUR ) 60 MG 24 hr tablet [id number]  Take 1 tablet (60 mg total) by mouth daily. Pearlean Manus, MD  Active   Magnesium  250 MG TABS [id number]  Take 1 tablet (250 mg total) by mouth daily. Zollie Lowers, MD  Active Self, Pharmacy Records           Med Note [place], [facility]   Mon Nov 04, 2023  9:27 AM) Per patient, takes q 2-3 days  neomycin -polymyxin-hydrocortisone (CORTISPORIN) 3.5-10000-1 ophthalmic suspension [id number]  Place 3 drops into both eyes 4 (four) times daily. Zollie Lowers, MD  Active Self, Pharmacy Records           Med Note [id number], [place]   Tue Mar 10, 2024  2:40 PM) Pt states she is still using medication  Oxycodone  HCl 10 MG TABS [id number]  Take 1 tablet (10 mg total) by mouth 4 (four) times daily as needed for up to 5 days (for severe pain only). Zollie Lowers, MD  Active   pantoprazole  (PROTONIX ) 40 MG tablet [id number]  Take 1 tablet (40 mg total) by mouth daily. Pearlean Manus, MD  Active   potassium chloride  SA (KLOR-CON  M) 20 MEQ tablet [id number]  Take 1 tablet (20 mEq total) by mouth daily. For potassium replacement/ supplement Zollie Lowers, MD  Active Self, Pharmacy Records  pregabalin  (LYRICA ) 300 MG capsule [id number]  Take 1 capsule (300 mg total) by mouth at bedtime. To reduce back pain Zollie Lowers, MD  Active Self, Pharmacy Records  rOPINIRole  (REQUIP ) 1 MG tablet [id number]  TAKE 1 TABLET (1 MG TOTAL) BY MOUTH AT BEDTIME. FOR LEG CRAMPS  Zollie Lowers, MD  Active Self, Pharmacy Records  tizanidine  (ZANAFLEX ) 2 MG capsule [id number]  Take 2 mg by mouth at bedtime. [provider]  Active Self, Pharmacy Records  vitamin B-12 1000 MCG tablet [id number]  Take 1 tablet (1,000 mcg total) by mouth daily. Vicci Afton CROME, MD  Active Self, Pharmacy Records            Goals Addressed             This Visit's Progress    VBCI Transitions of Care (TOC) Care Plan       Problems:  Recent Hospitalization for treatment of Acute Pancreatitis Knowledge Deficit Related to Acute Pancreatitis 03/24/24- pt reports  I'm doing well,  no new concerns reported 04/01/24- pt reports she went to ED 11/2 for new onset pain right foot 5th digit and discoloration, xrays completed, pt is wearing off loading shoe and to see podiatrist tomorrow 04/02/24, pt states  they don't know what it is 04/08/24- pt saw primary care provider 11/10 and will see  vascular today 11/12 regarding her right foot/ toes, pt continues wearing off loading shoe, pt had BSW visit on 11/7.  Goal:  Over the next 30 days, the patient will not experience hospital readmission  Interventions:  Transitions of Care: Doctor Visits  - discussed the importance of doctor visits Reviewed Signs and symptoms of infection and pancreatitis Reviewed diet/ food choices, importance of limiting fatty foods Reviewed safety precautions, energy conservation Reinforced signs/ symptoms infection Pain assessment completed- pt denies pain today, reinforced relaxation, adequate rest, keeping stress to a minimum, take tylenol  as prescribed  Patient Self Care Activities:  Attend all scheduled provider appointments Call pharmacy for medication refills 3-7 days in advance of running out of medications Notify RN Care Manager of TOC call rescheduling needs Participate in Transition of Care Program/Attend TOC scheduled calls Perform all self care activities independently  Perform IADL's  (shopping, preparing meals, housekeeping, managing finances) independently Take medications as prescribed    Plan:  Telephone follow up appointment with care management team member scheduled for:  04/15/24 @ 215 pm        Recommendation:   PCP Follow-up Specialty provider follow-up vascular 04/08/24  Follow Up Plan:   Telephone follow-up 04/15/24 @ 215 pm  Mliss Creed [facility], BSN RN Care Manager/ Transition of Care [place]/ [facility] Population Health [phone]

## 2024-04-08 NOTE — Patient Instructions (Signed)
 Visit Information  Thank you for taking time to visit with me today. Please don't hesitate to contact me if I can be of assistance to you before our next scheduled telephone appointment.  Our next appointment is by telephone on 04/15/24 at 215 pm  Following is a copy of your care plan:   Goals Addressed             This Visit's Progress    VBCI Transitions of Care (TOC) Care Plan       Problems:  Recent Hospitalization for treatment of Acute Pancreatitis Knowledge Deficit Related to Acute Pancreatitis 03/24/24- pt reports  I'm doing well,  no new concerns reported 04/01/24- pt reports she went to ED 11/2 for new onset pain right foot 5th digit and discoloration, xrays completed, pt is wearing off loading shoe and to see podiatrist tomorrow 04/02/24, pt states  they don't know what it is 04/08/24- pt saw primary care provider 11/10 and will see vascular today 11/12 regarding her right foot/ toes, pt continues wearing off loading shoe, pt had BSW visit on 11/7.  Goal:  Over the next 30 days, the patient will not experience hospital readmission  Interventions:  Transitions of Care: Doctor Visits  - discussed the importance of doctor visits Reviewed Signs and symptoms of infection and pancreatitis Reviewed diet/ food choices, importance of limiting fatty foods Reviewed safety precautions, energy conservation Reinforced signs/ symptoms infection Pain assessment completed- pt denies pain today, reinforced relaxation, adequate rest, keeping stress to a minimum, take tylenol  as prescribed  Patient Self Care Activities:  Attend all scheduled provider appointments Call pharmacy for medication refills 3-7 days in advance of running out of medications Notify RN Care Manager of TOC call rescheduling needs Participate in Transition of Care Program/Attend TOC scheduled calls Perform all self care activities independently  Perform IADL's (shopping, preparing meals, housekeeping, managing  finances) independently Take medications as prescribed    Plan:  Telephone follow up appointment with care management team member scheduled for:  04/15/24 @ 215 pm        Care plan and visit instructions communicated with the patient verbally today. The patient DECLINED to receive copy of care plan and patient instructions in any format.   Telephone follow up appointment with care management team member scheduled for: 04/15/24 @ 215 pm  Please call the care guide team at [phone] if you need to cancel or reschedule your appointment.   Please call the Suicide and Crisis Lifeline: 988 call the [place]  National Suicide Prevention Lifeline: 856 823 3409 or TTY: 770-272-0706 TTY (340) 797-4180) to talk to a trained counselor call 1-800-273-TALK (toll free, 24 hour hotline) go to [facility] [address] [place] [phone]) call the [facility] Crisis Line: 507-852-8093 call 911 if you are experiencing a Mental Health or Behavioral Health Crisis or need someone to talk to.  Mliss Creed [facility], BSN RN Care Manager/ Transition of Care [place]/ [facility] [phone]

## 2024-04-09 ENCOUNTER — Encounter (HOSPITAL_COMMUNITY): Payer: Self-pay

## 2024-04-09 ENCOUNTER — Ambulatory Visit (HOSPITAL_COMMUNITY)
Admission: RE | Admit: 2024-04-09 | Discharge: 2024-04-09 | Disposition: A | Discharge status: Home / Self Care | Source: Ambulatory Visit | Attending: Family Medicine | Admitting: Family Medicine

## 2024-04-09 DIAGNOSIS — K859 Acute pancreatitis without necrosis or infection, unspecified: Secondary | ICD-10-CM

## 2024-04-13 ENCOUNTER — Emergency Department (HOSPITAL_COMMUNITY)

## 2024-04-13 ENCOUNTER — Other Ambulatory Visit: Payer: Self-pay

## 2024-04-13 ENCOUNTER — Inpatient Hospital Stay (HOSPITAL_COMMUNITY)
Admission: EM | Admit: 2024-04-13 | Discharge: 2024-04-16 | DRG: 271 | Disposition: A | Discharge status: Home Health | Attending: Internal Medicine | Admitting: Internal Medicine

## 2024-04-13 ENCOUNTER — Encounter (HOSPITAL_COMMUNITY): Payer: Self-pay | Admitting: Emergency Medicine

## 2024-04-13 ENCOUNTER — Ambulatory Visit: Payer: Self-pay

## 2024-04-13 DIAGNOSIS — G2581 Restless legs syndrome: Secondary | ICD-10-CM | POA: Diagnosis present

## 2024-04-13 DIAGNOSIS — I70291 Other atherosclerosis of native arteries of extremities, right leg: Secondary | ICD-10-CM | POA: Diagnosis not present

## 2024-04-13 DIAGNOSIS — Z825 Family history of asthma and other chronic lower respiratory diseases: Secondary | ICD-10-CM

## 2024-04-13 DIAGNOSIS — Z87891 Personal history of nicotine dependence: Secondary | ICD-10-CM

## 2024-04-13 DIAGNOSIS — Z7983 Long term (current) use of bisphosphonates: Secondary | ICD-10-CM | POA: Diagnosis not present

## 2024-04-13 DIAGNOSIS — I2510 Atherosclerotic heart disease of native coronary artery without angina pectoris: Secondary | ICD-10-CM | POA: Diagnosis present

## 2024-04-13 DIAGNOSIS — E785 Hyperlipidemia, unspecified: Secondary | ICD-10-CM | POA: Diagnosis present

## 2024-04-13 DIAGNOSIS — Z7401 Bed confinement status: Secondary | ICD-10-CM | POA: Diagnosis not present

## 2024-04-13 DIAGNOSIS — I739 Peripheral vascular disease, unspecified: Principal | ICD-10-CM | POA: Diagnosis present

## 2024-04-13 DIAGNOSIS — G629 Polyneuropathy, unspecified: Secondary | ICD-10-CM | POA: Diagnosis present

## 2024-04-13 DIAGNOSIS — Z8673 Personal history of transient ischemic attack (TIA), and cerebral infarction without residual deficits: Secondary | ICD-10-CM | POA: Diagnosis not present

## 2024-04-13 DIAGNOSIS — Z9842 Cataract extraction status, left eye: Secondary | ICD-10-CM | POA: Diagnosis not present

## 2024-04-13 DIAGNOSIS — I70221 Atherosclerosis of native arteries of extremities with rest pain, right leg: Secondary | ICD-10-CM | POA: Diagnosis not present

## 2024-04-13 DIAGNOSIS — M79671 Pain in right foot: Secondary | ICD-10-CM | POA: Diagnosis not present

## 2024-04-13 DIAGNOSIS — Z59868 Other specified financial insecurity: Secondary | ICD-10-CM

## 2024-04-13 DIAGNOSIS — I743 Embolism and thrombosis of arteries of the lower extremities: Secondary | ICD-10-CM | POA: Diagnosis present

## 2024-04-13 DIAGNOSIS — I96 Gangrene, not elsewhere classified: Secondary | ICD-10-CM | POA: Diagnosis not present

## 2024-04-13 DIAGNOSIS — R0902 Hypoxemia: Secondary | ICD-10-CM | POA: Diagnosis not present

## 2024-04-13 DIAGNOSIS — I70235 Atherosclerosis of native arteries of right leg with ulceration of other part of foot: Secondary | ICD-10-CM | POA: Diagnosis not present

## 2024-04-13 DIAGNOSIS — I5032 Chronic diastolic (congestive) heart failure: Secondary | ICD-10-CM | POA: Diagnosis present

## 2024-04-13 DIAGNOSIS — Z90710 Acquired absence of both cervix and uterus: Secondary | ICD-10-CM | POA: Diagnosis not present

## 2024-04-13 DIAGNOSIS — Z95828 Presence of other vascular implants and grafts: Secondary | ICD-10-CM | POA: Diagnosis not present

## 2024-04-13 DIAGNOSIS — Z7902 Long term (current) use of antithrombotics/antiplatelets: Secondary | ICD-10-CM | POA: Diagnosis not present

## 2024-04-13 DIAGNOSIS — L97519 Non-pressure chronic ulcer of other part of right foot with unspecified severity: Secondary | ICD-10-CM | POA: Diagnosis present

## 2024-04-13 DIAGNOSIS — Z7409 Other reduced mobility: Secondary | ICD-10-CM | POA: Diagnosis present

## 2024-04-13 DIAGNOSIS — N179 Acute kidney failure, unspecified: Secondary | ICD-10-CM | POA: Diagnosis present

## 2024-04-13 DIAGNOSIS — I10 Essential (primary) hypertension: Secondary | ICD-10-CM | POA: Diagnosis not present

## 2024-04-13 DIAGNOSIS — I252 Old myocardial infarction: Secondary | ICD-10-CM | POA: Diagnosis not present

## 2024-04-13 DIAGNOSIS — Z9841 Cataract extraction status, right eye: Secondary | ICD-10-CM

## 2024-04-13 DIAGNOSIS — I75021 Atheroembolism of right lower extremity: Secondary | ICD-10-CM | POA: Diagnosis present

## 2024-04-13 DIAGNOSIS — I700 Atherosclerosis of aorta: Secondary | ICD-10-CM | POA: Diagnosis present

## 2024-04-13 DIAGNOSIS — I70261 Atherosclerosis of native arteries of extremities with gangrene, right leg: Principal | ICD-10-CM | POA: Diagnosis present

## 2024-04-13 DIAGNOSIS — J4489 Other specified chronic obstructive pulmonary disease: Secondary | ICD-10-CM | POA: Diagnosis present

## 2024-04-13 DIAGNOSIS — Z8249 Family history of ischemic heart disease and other diseases of the circulatory system: Secondary | ICD-10-CM | POA: Diagnosis not present

## 2024-04-13 DIAGNOSIS — Z981 Arthrodesis status: Secondary | ICD-10-CM

## 2024-04-13 DIAGNOSIS — R6889 Other general symptoms and signs: Secondary | ICD-10-CM | POA: Diagnosis not present

## 2024-04-13 DIAGNOSIS — Z880 Allergy status to penicillin: Secondary | ICD-10-CM | POA: Diagnosis not present

## 2024-04-13 DIAGNOSIS — Z743 Need for continuous supervision: Secondary | ICD-10-CM | POA: Diagnosis not present

## 2024-04-13 DIAGNOSIS — Z5982 Transportation insecurity: Secondary | ICD-10-CM

## 2024-04-13 DIAGNOSIS — Z961 Presence of intraocular lens: Secondary | ICD-10-CM | POA: Diagnosis present

## 2024-04-13 DIAGNOSIS — I110 Hypertensive heart disease with heart failure: Secondary | ICD-10-CM | POA: Diagnosis present

## 2024-04-13 DIAGNOSIS — Z9889 Other specified postprocedural states: Secondary | ICD-10-CM | POA: Diagnosis not present

## 2024-04-13 DIAGNOSIS — F339 Major depressive disorder, recurrent, unspecified: Secondary | ICD-10-CM | POA: Diagnosis present

## 2024-04-13 DIAGNOSIS — Z7982 Long term (current) use of aspirin: Secondary | ICD-10-CM

## 2024-04-13 DIAGNOSIS — Z79899 Other long term (current) drug therapy: Secondary | ICD-10-CM | POA: Diagnosis not present

## 2024-04-13 DIAGNOSIS — M7989 Other specified soft tissue disorders: Secondary | ICD-10-CM | POA: Diagnosis not present

## 2024-04-13 DIAGNOSIS — E782 Mixed hyperlipidemia: Secondary | ICD-10-CM

## 2024-04-13 LAB — CBC
HCT: 42.5 % (ref 36.0–46.0)
Hemoglobin: 13.2 g/dL (ref 12.0–15.0)
MCH: 29.2 pg (ref 26.0–34.0)
MCHC: 31.1 g/dL (ref 30.0–36.0)
MCV: 94 fL (ref 80.0–100.0)
Platelets: 167 K/uL (ref 150–400)
RBC: 4.52 MIL/uL (ref 3.87–5.11)
RDW: 13 % (ref 11.5–15.5)
WBC: 5.8 K/uL (ref 4.0–10.5)
nRBC: 0 % (ref 0.0–0.2)

## 2024-04-13 LAB — BASIC METABOLIC PANEL WITH GFR
Anion gap: 12 (ref 5–15)
BUN: 12 mg/dL (ref 8–23)
CO2: 25 mmol/L (ref 22–32)
Calcium: 9.1 mg/dL (ref 8.9–10.3)
Chloride: 105 mmol/L (ref 98–111)
Creatinine, Ser: 1.11 mg/dL — ABNORMAL HIGH (ref 0.44–1.00)
GFR, Estimated: 50 mL/min — ABNORMAL LOW (ref >60)
Glucose, Bld: 94 mg/dL (ref 70–99)
Potassium: 4 mmol/L (ref 3.5–5.1)
Sodium: 142 mmol/L (ref 135–145)

## 2024-04-13 MED ORDER — IOHEXOL 350 MG/ML SOLN
100.0000 mL | Freq: Once | INTRAVENOUS | Status: AC | PRN
  Administered 2024-04-13: 100 mL via INTRAVENOUS

## 2024-04-13 MED ORDER — HEPARIN (PORCINE) 25000 UT/250ML-% IV SOLN
800.0000 [IU]/h | INTRAVENOUS | Status: DC
  Administered 2024-04-13: 800 [IU]/h via INTRAVENOUS
  Filled 2024-04-13: qty 250

## 2024-04-13 MED ORDER — ROPINIROLE HCL 0.5 MG PO TABS
1.0000 mg | ORAL_TABLET | Freq: Every day | ORAL | Status: DC
  Administered 2024-04-13 – 2024-04-15 (×3): 1 mg via ORAL
  Filled 2024-04-13 (×3): qty 2

## 2024-04-13 MED ORDER — ONDANSETRON HCL 4 MG/2ML IJ SOLN: 4.0000 mg | Freq: Four times a day (QID) | INTRAMUSCULAR | Status: DC | PRN

## 2024-04-13 MED ORDER — ACETAMINOPHEN 650 MG RE SUPP: 650.0000 mg | Freq: Four times a day (QID) | RECTAL | Status: DC | PRN

## 2024-04-13 MED ORDER — POLYETHYLENE GLYCOL 3350 17 G PO PACK: 17.0000 g | PACK | Freq: Every day | ORAL | Status: DC | PRN

## 2024-04-13 MED ORDER — SODIUM CHLORIDE 0.9 % IV BOLUS
500.0000 mL | Freq: Once | INTRAVENOUS | Status: AC
  Administered 2024-04-13: 500 mL via INTRAVENOUS

## 2024-04-13 MED ORDER — ACETAMINOPHEN 325 MG PO TABS
650.0000 mg | ORAL_TABLET | Freq: Four times a day (QID) | ORAL | Status: DC | PRN
  Administered 2024-04-14: 650 mg via ORAL
  Filled 2024-04-13: qty 2

## 2024-04-13 MED ORDER — HEPARIN BOLUS VIA INFUSION
3000.0000 [IU] | Freq: Once | INTRAVENOUS | Status: AC
  Administered 2024-04-13: 3000 [IU] via INTRAVENOUS

## 2024-04-13 MED ORDER — MORPHINE SULFATE (PF) 4 MG/ML IV SOLN
4.0000 mg | Freq: Once | INTRAVENOUS | Status: AC
  Administered 2024-04-13: 4 mg via INTRAVENOUS
  Filled 2024-04-13: qty 1

## 2024-04-13 MED ORDER — ONDANSETRON HCL 4 MG PO TABS: 4.0000 mg | ORAL_TABLET | Freq: Four times a day (QID) | ORAL | Status: DC | PRN

## 2024-04-13 MED ORDER — ONDANSETRON HCL 4 MG/2ML IJ SOLN
4.0000 mg | Freq: Once | INTRAMUSCULAR | Status: AC
  Administered 2024-04-13: 4 mg via INTRAVENOUS
  Filled 2024-04-13: qty 2

## 2024-04-13 MED ORDER — ISOSORBIDE MONONITRATE ER 60 MG PO TB24
60.0000 mg | ORAL_TABLET | Freq: Every day | ORAL | Status: DC
  Administered 2024-04-14 – 2024-04-16 (×3): 60 mg via ORAL
  Filled 2024-04-13 (×3): qty 1

## 2024-04-13 MED ORDER — AMLODIPINE BESYLATE 10 MG PO TABS
10.0000 mg | ORAL_TABLET | Freq: Every day | ORAL | Status: DC
  Administered 2024-04-14 – 2024-04-16 (×3): 10 mg via ORAL
  Filled 2024-04-13 (×3): qty 1

## 2024-04-13 MED ORDER — HYDROCODONE-ACETAMINOPHEN 5-325 MG PO TABS
1.0000 | ORAL_TABLET | Freq: Four times a day (QID) | ORAL | Status: AC | PRN
Start: 2024-04-13 — End: 2024-04-14
  Administered 2024-04-13 – 2024-04-14 (×2): 1 via ORAL
  Filled 2024-04-13: qty 1

## 2024-04-13 NOTE — Telephone Encounter (Signed)
 FYI Only or Action Required?: FYI only for provider: ED advised.  Patient was last seen in primary care on 04/06/2024 by Zollie Lowers, MD.  Called Nurse Triage reporting Foot Swelling.  Symptoms began several days ago.  Interventions attempted: Rest, hydration, or home remedies.  Symptoms are: rapidly worsening.  Triage Disposition: Go to ED Now (Notify PCP)  Patient/caregiver understands and will follow disposition?: Yes  Copied from CRM #[id number]. Topic: Clinical - Red Word Triage >> Apr 13, 2024  8:26 AM Marda MATSU wrote: Red Word that prompted transfer to Nurse Triage: Both feet (toes) are starting to turn black.  Right foot 3 toe and left foot 1 toe, starting to feel anxiety and painful. Reason for Disposition  Purple or black skin on foot or toe  Nursing judgment or information in reference  Answer Assessment - Initial Assessment Questions Denies CP, SOB, or dizziness. Right foot ischemia progression with symptoms presenting in left foot now. Denies color changes in left but swelling and warm.   1. REASON FOR CALL: What is your main concern right now?     Toe ischemia progression 2. ONSET: When did the progression start?    The last couple days 3. SEVERITY: How bad is the ischemia?     Was one toes on the right foot but the sides of the toes next to it turning black 4. FUNCTIONAL IMPAIRMENT: How have things been going for you overall? Have you had more difficulty than usual doing your normal daily activities? (e.g., self-care, school, work, interactions)     Walking with cane. Not sleeping-  5. RELIEVING AND AGGRAVATING FACTORS: What makes it better or worse? (e.g., certain activities, rest)     denies 6. FEVER: Do you have a fever?     denies 7. OTHER SYMPTOMS: Do you have any other new symptoms?     Left foot pain and swelling  8. TREATMENTS AND RESPONSE: What have you done so far to try to make this better? What medicines have you used?     Very  minimal relief with meds from [facility]  Answer Assessment - Initial Assessment Questions Left foot started to present with same symptoms of pre-ischemia that the right foot had. Swelling and warm. Right foot swollen and cracking, with fluid weeping. Concerned the left foot is going down the same path.   1. ONSET: When did the pain start?      Yesterday 2. LOCATION: Where is the pain located?      Left foot  3. PAIN: How bad is the pain?    (Scale 1-10; or mild, moderate, severe)     10/10 4. WORK OR EXERCISE: Has there been any recent work or exercise that involved this part of the body?      denies 5. CAUSE: What do you think is causing the foot pain?     Ischemia  6. OTHER SYMPTOMS: Do you have any other symptoms? (e.g., leg pain, rash, fever, numbness)     swelling  Protocols used: No Guideline Available-A-AH, Foot Pain-A-AH

## 2024-04-13 NOTE — H&P (Addendum)
 History and Physical    [name] FMW:[id number] DOB: [birth year] DOA: 04/13/2024  PCP: Zollie Lowers, MD   Patient coming from: Home  I have personally briefly reviewed patient's old medical records in [facility]  Chief Complaint: Left foot swelling  HPI: [name] is a 83 y.o. female with medical history significant for COPD, hypertension, CVA, renal artery stenosis on the left, tobacco abuse, gout. Patient presented to the ED with complaints of pain and swelling to her foot, pains to her right foot is much more severe and progressively getting worse.  She went today ED at [facility] 11/2, had ABIs done, a lot of with her PCP and was subsequently referred to vascular surgery-appointment is still pending.  ABI showed severe right lower extremity arterial disease on the right, left ABI-toe pressure greater than 60 suggesting adequate perfusion for healing.  ED Course: Stable vitals.  X-ray without acute abnormality.  Patient's right fifth toe was found to be necrotic, EDP consulted vascular surgeon Dr. Silver, will see in consult at [facility].  Heparin  drip was ordered. CT angio aorta + bifemoral-complete occlusion of proximal to mid right superficial femoral artery and right popliteal artery with distal reconstitution, complete occlusion of right mid peroneal artery with diminished flow to right foot and anterior posterior tibial arteries.  Left femoral artery stenosis without complete occlusion.  Review of Systems: As per HPI all other systems reviewed and negative.  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   Ileus (HCC) 05/15/2021   Influenza A 07/15/2023   NSTEMI (non-ST elevated myocardial infarction) (HCC) 07/10/2023   Stroke (HCC)    SLIGHT RT SIDE WEAKNESS 2001   Unintentional weight loss 03/19/2022    Past Surgical History:  Procedure Laterality Date   ABDOMINAL HYSTERECTOMY     ANTERIOR CERVICAL CORPECTOMY  03/07/2012   Procedure: ANTERIOR CERVICAL CORPECTOMY;  Surgeon: Victory Gens, MD;  Location: [facility] NEURO ORS;  Service: Neurosurgery;  Laterality: N/A;  Cervical six-seven, cervical seven-thoracic one Anterior cervical decompression/diskectomy/fusion, with Cervical seven Corpectomy, reconstruction using Allograft and Alphatec plate   CATARACT EXTRACTION W/PHACO Left 04/12/2017   Procedure: CATARACT EXTRACTION PHACO AND INTRAOCULAR LENS PLACEMENT (IOC);  Surgeon: Harrie Agent, MD;  Location: [facility] ORS;  Service: Ophthalmology;  Laterality: Left;  CDE: 3.82   CATARACT EXTRACTION W/PHACO Right 05/03/2017   Procedure: CATARACT EXTRACTION PHACO AND INTRAOCULAR LENS PLACEMENT RIGHT EYE;  Surgeon: Harrie Agent, MD;  Location: [facility] ORS;  Service: Ophthalmology;  Laterality: Right;  CDE: 3.89   LEFT HEART CATH AND CORONARY ANGIOGRAPHY N/A 07/11/2023   Procedure: LEFT HEART CATH AND CORONARY ANGIOGRAPHY;  Surgeon: Elmira Newman PARAS, MD;  Location: [facility] INVASIVE CV LAB;  Service: Cardiovascular;  Laterality: N/A;   MULTIPLE TOOTH EXTRACTIONS     TONSILLECTOMY       reports that she quit smoking about 2 years ago. Her smoking use included cigarettes. She started smoking about 63 years ago. She has  a 30.9 pack-year smoking history. She has been exposed to tobacco smoke. She has never used smokeless tobacco. She reports that she does not currently use alcohol. She reports that she does not use drugs.  Allergies  Allergen Reactions   Penicillins Anaphylaxis,  Swelling, Rash and Other (See Comments)    Immediate rash, facial/tongue/throat swelling, SOB or lightheadedness with hypotension    Family History  Adopted: Yes  Problem Relation Age of Onset   Bipolar disorder Daughter    Heart disease Daughter    Asthma Daughter    Bipolar disorder Son    Bipolar disorder Daughter    Bipolar disorder Daughter    Bipolar disorder Daughter    Hypertension Daughter    Heart disease Daughter    Drug abuse Daughter        OD    Prior to Admission medications   Medication Sig Start Date End Date Taking? Authorizing Provider  acetaminophen  (TYLENOL ) 500 MG tablet Take 2 tablets (1,000 mg total) by mouth 3 (three) times daily. 10/30/22   Zollie Lowers, MD  albuterol  (VENTOLIN  HFA) 108 [phone] Base) MCG/ACT inhaler Inhale 2 puffs into the lungs every 4 (four) hours as needed for wheezing or shortness of breath. 03/13/24   Pearlean Manus, MD  alendronate  (FOSAMAX ) 70 MG tablet Take 1 tablet (70 mg total) by mouth every 7 (seven) days. Take with a full glass of water  on an empty stomach. 02/25/24   Zollie Lowers, MD  amLODipine -olmesartan  (AZOR ) 10-40 MG tablet Take 1 tablet by mouth daily. For blood pressure. 03/13/24   Pearlean Manus, MD  atorvastatin  (LIPITOR ) 80 MG tablet Take 1 tablet (80 mg total) by mouth daily. 06/11/23   Walker, Caitlin S, NP  cloNIDine  (CATAPRES ) 0.2 MG tablet Take 1.5 tablets morning and evening, and 1 tablet mid-day. 10/29/23   Raford Riggs, MD  clopidogrel  (PLAVIX ) 75 MG tablet Take 1 tablet (75 mg total) by mouth daily. 03/13/24   Pearlean Manus, MD  fluticasone  furoate-vilanterol (BREO ELLIPTA ) 200-25 MCG/ACT AEPB Inhale 1 puff into the lungs daily. 03/13/24   Allona Gondek, Courage, MD  furosemide  (LASIX ) 20 MG tablet TAKE 1 TABLET (20 MG TOTAL) BY MOUTH DAILY. FOR SWELLING 12/09/23   Raford Riggs, MD  isosorbide  mononitrate (IMDUR ) 60 MG 24 hr tablet Take 1 tablet (60 mg total) by mouth daily. 03/13/24   Pearlean Manus, MD   Magnesium  250 MG TABS Take 1 tablet (250 mg total) by mouth daily. 06/18/23   Zollie Lowers, MD  neomycin -polymyxin-hydrocortisone (CORTISPORIN) 3.5-10000-1 ophthalmic suspension Place 3 drops into both eyes 4 (four) times daily. 12/16/23   Zollie Lowers, MD  pantoprazole  (PROTONIX ) 40 MG tablet Take 1 tablet (40 mg total) by mouth daily. 03/13/24   Pearlean Manus, MD  potassium chloride  SA (KLOR-CON  M) 20 MEQ tablet Take 1 tablet (20 mEq total) by mouth daily. For potassium replacement/ supplement 12/16/23   Zollie Lowers, MD  pregabalin  (LYRICA ) 300 MG capsule Take 1 capsule (300 mg total) by mouth at bedtime. To reduce back pain 02/25/24 02/19/25  Zollie Lowers, MD  rOPINIRole  (REQUIP ) 1 MG tablet TAKE 1 TABLET (1 MG TOTAL) BY MOUTH AT BEDTIME. FOR LEG CRAMPS 03/10/24   Zollie Lowers, MD  tizanidine  (ZANAFLEX ) 2 MG capsule Take 2 mg by mouth at bedtime.    [provider]  vitamin B-12 1000 MCG tablet Take 1 tablet (1,000 mcg total) by mouth daily. 09/21/22   Vicci Afton CROME, MD    Physical Exam: Vitals:   04/13/24 1134  04/13/24 1415 04/13/24 1540 04/13/24 1541  BP:  (!) 161/90 (!) 156/72   Pulse:  86 85 85  Resp:  17 14 (!) 24  Temp:      TempSrc:      SpO2:  100% 94% 94%  Weight: 50.8 kg     Height: 5' 1 (1.549 m)       Constitutional: NAD, calm, comfortable Vitals:   04/13/24 1134 04/13/24 1415 04/13/24 1540 04/13/24 1541  BP:  (!) 161/90 (!) 156/72   Pulse:  86 85 85  Resp:  17 14 (!) 24  Temp:      TempSrc:      SpO2:  100% 94% 94%  Weight: 50.8 kg     Height: 5' 1 (1.549 m)      Eyes: PERRL, lids and conjunctivae normal ENMT: Mucous membranes are moist.   Neck: normal, supple, no masses, no thyromegaly Respiratory: clear to auscultation bilaterally, no wheezing, no crackles. Normal respiratory effort. No accessory muscle use.  Cardiovascular: Regular rate and rhythm, no murmurs / rubs / gallops.  Abdomen: no tenderness, no masses palpated. No  hepatosplenomegaly. Bowel sounds positive.  Musculoskeletal: no clubbing / cyanosis. No joint deformity upper and lower extremities.  Skin: Dark discoloration to fifth right toe, minimal swelling to foot, no rashes, lesions, ulcers.  Neurologic: No facial asymmetry, moves extremities spontaneously.  Psychiatric: Normal judgment and insight. Alert and oriented x 3. Normal mood.        Labs on Admission: I have personally reviewed following labs and imaging studies  CBC: Recent Labs  Lab 04/13/24 1246  WBC 5.8  HGB 13.2  HCT 42.5  MCV 94.0  PLT 167   Basic Metabolic Panel: Recent Labs  Lab 04/13/24 1246  NA 142  K 4.0  CL 105  CO2 25  GLUCOSE 94  BUN 12  CREATININE 1.11*  CALCIUM  9.1   Radiological Exams on Admission: DG Chest 2 View Result Date: 04/13/2024 CLINICAL DATA:  Bilateral foot pain and swelling. EXAM: CHEST - 2 VIEW COMPARISON:  03/08/2024 and CT chest 09/16/2022. FINDINGS: Trachea is midline. Heart size normal. Thoracic aorta is calcified. Lungs are hyperinflated. Streaky density in the apical right upper lobe, somewhat improved. Lungs are otherwise clear. No pleural fluid. Degenerative changes in the spine. IMPRESSION: 1. Streaky scarring in the right upper lobe. 2. No acute findings. Electronically Signed   By: Newell Eke M.D.   On: 04/13/2024 12:46   EKG: Independently reviewed.  Sinus rhythm, rate 80, QTc 472.  Atrial premature complexes.  No significant change from prior.  Assessment/Plan Principal Problem:   Toe necrosis (HCC) Active Problems:   Peripheral artery disease   Essential hypertension with goal blood pressure less than 130/80   COPD with chronic bronchitis (HCC)   Depression, recurrent   History of CVA (cerebrovascular accident)  Assessment and Plan:  Peripheral artery disease, Right fifth toe necrosis- CT angio aorta + bifemoral-complete occlusion of proximal to mid right superficial femoral artery and right popliteal artery with  distal reconstitution, complete occlusion of right mid peroneal artery with diminished flow to right foot and anterior posterior tibial arteries.  Left femoral artery stenosis without complete occlusion. -Heparin  drip started in the ED, continue - EDP talked to vascular surgeon on-call Dr. Silver, will see in consult at [facility] midnight - Hold Plavix  for now  Hypertension-systolic 150s to 839d - Awaiting med reconciliation, - Resume amlodipine  10 mg daily, imdur   COPD/tobacco abuse history-lung exam stable. -  Resume home regimen  Restless legs - Resume ropinirole   Chronic diastolic CHF.-Stable and compensated.  Last echo 06/2023 EF of 55 to 60%, grade 1 DD.  - 20mg  daily Lasix , olmesartan  40 mg held for now with contrast exposure   DVT prophylaxis: Heparin  drip Code Status: Full code Family Communication: None at bedside Disposition Plan: ~2 days Consults called: Vasc Surgery Admission status: Inpt Med surg I certify that at the point of admission it is my clinical judgment that the patient will require inpatient hospital care spanning beyond 2 midnights from the point of admission due to high intensity of service, high risk for further deterioration and high frequency of surveillance required.    Author: Tully FORBES Carwin, MD 04/13/2024 6:46 PM  For on call review [email or web address].

## 2024-04-13 NOTE — ED Provider Notes (Signed)
 [place] EMERGENCY DEPARTMENT AT [facility] Provider Note   CSN: [id number] Arrival date & time: 04/13/24  1126     Patient presents with: Foot Pain   [name] is a 84 y.o. female.   Pt is an 83 yo female with pmhx significant for HTN, GERD, CVA, Gout, and CHF.  Pt has been having right foot pain for about 2 weeks.  She initially went to the ED in [place] on 11/2 and dx was toe pain.  She did f/u with her PCP on 11/10 and he referred her to vascular surgery.  She has not had an appt with vascular yet.  He also ordered ABIs which were done on 11/12.   Results:   Right: Resting right ankle- brachial index indicates severe right lower extremity arterial disease. The right toe- brachial index is abnormal.  Left: Resting left ankle- brachial index indicates mild left lower extremity arterial disease. Left toe pressure is > 60 mmHg which suggests adequate perfusion for healing.  Pt comes to the ED today with worsening pain to the right foot.  She also has  little pain in her left foot.  She said she could not stand the pain any more.       Prior to Admission medications   Medication Sig Start Date End Date Taking? Authorizing Provider  acetaminophen  (TYLENOL ) 500 MG tablet Take 2 tablets (1,000 mg total) by mouth 3 (three) times daily. 10/30/22   Zollie Lowers, MD  albuterol  (VENTOLIN  HFA) 108 [phone] Base) MCG/ACT inhaler Inhale 2 puffs into the lungs every 4 (four) hours as needed for wheezing or shortness of breath. 03/13/24   Pearlean Manus, MD  alendronate  (FOSAMAX ) 70 MG tablet Take 1 tablet (70 mg total) by mouth every 7 (seven) days. Take with a full glass of water  on an empty stomach. 02/25/24   Zollie Lowers, MD  amLODipine -olmesartan  (AZOR ) 10-40 MG tablet Take 1 tablet by mouth daily. For blood pressure. 03/13/24   Pearlean Manus, MD  atorvastatin  (LIPITOR ) 80 MG tablet Take 1 tablet (80 mg total) by mouth daily. 06/11/23   Walker, Caitlin S, NP  cloNIDine   (CATAPRES ) 0.2 MG tablet Take 1.5 tablets morning and evening, and 1 tablet mid-day. 10/29/23   Raford Riggs, MD  clopidogrel  (PLAVIX ) 75 MG tablet Take 1 tablet (75 mg total) by mouth daily. 03/13/24   Pearlean Manus, MD  fluticasone  furoate-vilanterol (BREO ELLIPTA ) 200-25 MCG/ACT AEPB Inhale 1 puff into the lungs daily. 03/13/24   Pearlean Manus, MD  furosemide  (LASIX ) 20 MG tablet TAKE 1 TABLET (20 MG TOTAL) BY MOUTH DAILY. FOR SWELLING 12/09/23   Raford Riggs, MD  isosorbide  mononitrate (IMDUR ) 60 MG 24 hr tablet Take 1 tablet (60 mg total) by mouth daily. 03/13/24   Pearlean Manus, MD  Magnesium  250 MG TABS Take 1 tablet (250 mg total) by mouth daily. 06/18/23   Zollie Lowers, MD  neomycin -polymyxin-hydrocortisone (CORTISPORIN) 3.5-10000-1 ophthalmic suspension Place 3 drops into both eyes 4 (four) times daily. 12/16/23   Zollie Lowers, MD  pantoprazole  (PROTONIX ) 40 MG tablet Take 1 tablet (40 mg total) by mouth daily. 03/13/24   Pearlean Manus, MD  potassium chloride  SA (KLOR-CON  M) 20 MEQ tablet Take 1 tablet (20 mEq total) by mouth daily. For potassium replacement/ supplement 12/16/23   Zollie Lowers, MD  pregabalin  (LYRICA ) 300 MG capsule Take 1 capsule (300 mg total) by mouth at bedtime. To reduce back pain 02/25/24 02/19/25  Zollie Lowers, MD  rOPINIRole  (REQUIP ) 1 MG  tablet TAKE 1 TABLET (1 MG TOTAL) BY MOUTH AT BEDTIME. FOR LEG CRAMPS 03/10/24   Zollie Lowers, MD  tizanidine  (ZANAFLEX ) 2 MG capsule Take 2 mg by mouth at bedtime.    [provider]  vitamin B-12 1000 MCG tablet Take 1 tablet (1,000 mcg total) by mouth daily. 09/21/22   Vicci Afton CROME, MD    Allergies: Penicillins    Review of Systems  Musculoskeletal:        Right foot pain  All other systems reviewed and are negative.   Updated Vital Signs BP (!) 156/72   Pulse 85   Temp 98.3 F (36.8 C) (Oral)   Resp (!) 24   Ht 5' 1 (1.549 m)   Wt 50.8 kg   SpO2 94%   BMI 21.16 kg/m    Physical Exam Vitals and nursing note reviewed.  Constitutional:      Appearance: Normal appearance.  HENT:     Head: Normocephalic and atraumatic.     Right Ear: External ear normal.     Left Ear: External ear normal.     Nose: Nose normal.     Mouth/Throat:     Mouth: Mucous membranes are moist.     Pharynx: Oropharynx is clear.  Eyes:     Extraocular Movements: Extraocular movements intact.     Conjunctiva/sclera: Conjunctivae normal.     Pupils: Pupils are equal, round, and reactive to light.  Cardiovascular:     Rate and Rhythm: Normal rate and regular rhythm.     Pulses:          Dorsalis pedis pulses are detected w/ Doppler on the right side and 2+ on the left side.       Posterior tibial pulses are detected w/ Doppler on the right side and 2+ on the left side.     Heart sounds: Normal heart sounds.  Pulmonary:     Effort: Pulmonary effort is normal.     Breath sounds: Normal breath sounds.  Abdominal:     General: Abdomen is flat. Bowel sounds are normal.     Palpations: Abdomen is soft.  Musculoskeletal:     Cervical back: Normal range of motion and neck supple.     Comments: Right foot swollen; right 5th toe is necrotic  Skin:    General: Skin is warm.     Capillary Refill: Capillary refill takes less than 2 seconds.  Neurological:     General: No focal deficit present.     Mental Status: She is alert and oriented to person, place, and time.  Psychiatric:        Mood and Affect: Mood normal.        Behavior: Behavior normal.        (all labs ordered are listed, but only abnormal results are displayed) Labs Reviewed  BASIC METABOLIC PANEL WITH GFR - Abnormal; Notable for the following components:      Result Value   Creatinine, Ser 1.11 (*)    GFR, Estimated 50 (*)    All other components within normal limits  CBC    EKG: EKG Interpretation Date/Time:  Monday April 13 2024 14:31:01 EST Ventricular Rate:  80 PR Interval:  146 QRS  Duration:  94 QT Interval:  409 QTC Calculation: 472 R Axis:   -20  Text Interpretation: Sinus rhythm Atrial premature complex Borderline left axis deviation Probable anteroseptal infarct, old ST elevation, consider inferior injury No significant change since last tracing Confirmed by Dean Clarity (  [id number]) on 04/13/2024 3:57:10 PM  Radiology: DG Chest 2 View Result Date: 04/13/2024 CLINICAL DATA:  Bilateral foot pain and swelling. EXAM: CHEST - 2 VIEW COMPARISON:  03/08/2024 and CT chest 09/16/2022. FINDINGS: Trachea is midline. Heart size normal. Thoracic aorta is calcified. Lungs are hyperinflated. Streaky density in the apical right upper lobe, somewhat improved. Lungs are otherwise clear. No pleural fluid. Degenerative changes in the spine. IMPRESSION: 1. Streaky scarring in the right upper lobe. 2. No acute findings. Electronically Signed   By: Newell Eke M.D.   On: 04/13/2024 12:46     Procedures   Medications Ordered in the ED  morphine  (PF) 4 MG/ML injection 4 mg (4 mg Intravenous Given 04/13/24 1537)  ondansetron  (ZOFRAN ) injection 4 mg (4 mg Intravenous Given 04/13/24 1537)  sodium chloride  0.9 % bolus 500 mL (500 mLs Intravenous Bolus 04/13/24 1536)  iohexol  (OMNIPAQUE ) 350 MG/ML injection 100 mL (100 mLs Intravenous Contrast Given 04/13/24 1632)                                    Medical Decision Making Amount and/or Complexity of Data Reviewed Labs: ordered. Radiology: ordered.  Risk Prescription drug management. Decision regarding hospitalization.   This patient presents to the ED for concern of foot pain, this involves an extensive number of treatment options, and is a complaint that carries with it a high risk of complications and morbidity.  The differential diagnosis includes PAD, osteomyelitis, electrolyte abn   Co morbidities that complicate the patient evaluation  TN, GERD, CVA, Gout, and CHF.   Additional history obtained:  Additional history  obtained from epic chart review  Lab Tests:  I Ordered, and personally interpreted labs.  The pertinent results include:  cbc nl, bmp nl other than cr sl elevated at 1.11   Imaging Studies ordered:  I ordered imaging studies including cxr and cta  I independently visualized and interpreted imaging which showed  CXR: Streaky scarring in the right upper lobe.  2. No acute findings.   I agree with the radiologist interpretation   Cardiac Monitoring:  The patient was maintained on a cardiac monitor.  I personally viewed and interpreted the cardiac monitored which showed an underlying rhythm of: nsr   Medicines ordered and prescription drug management:  I ordered medication including morphine /zofran   for sx  Reevaluation of the patient after these medicines showed that the patient improved I have reviewed the patients home medicines and have made adjustments as needed   Test Considered:  cta   Consultations Obtained:  I requested consultation with the vascular surgeon (Dr. Lanis) ,  and discussed lab and imaging findings as well as pertinent plan - he will see pt when she gets to [facility]. Pt d/w Dr. Pearlean (triad) for admission   Problem List / ED Course:  PAD with necrotic toe: heparin  started.  Pain medication given and is helping.  CT pending upon admission.    Reevaluation:  After the interventions noted above, I reevaluated the patient and found that they have :improved   Social Determinants of Health:  Lives alone   Dispostion:  After consideration of the diagnostic results and the patients response to treatment, I feel that the patent would benefit from admission.       Final diagnoses:  PAD (peripheral artery disease)  Toe necrosis [facility])    ED Discharge Orders     None  Dean Clarity, MD 04/13/24 [phone]

## 2024-04-13 NOTE — ED Triage Notes (Signed)
 Pt BIB [facility] from home with c/o bilateral foot pain and swelling, pt was seen at [facility] Friday for same

## 2024-04-13 NOTE — Progress Notes (Signed)
 PHARMACY - ANTICOAGULATION CONSULT NOTE  Pharmacy Consult for heparin  Indication: PAD w/ necrotic toe  Allergies  Allergen Reactions   Penicillins Anaphylaxis, Swelling, Rash and Other (See Comments)    Immediate rash, facial/tongue/throat swelling, SOB or lightheadedness with hypotension    Patient Measurements: Height: 5' 1 (154.9 cm) Weight: 50.8 kg (112 lb) IBW/kg (Calculated) : 47.8 HEPARIN  DW (KG): 50.8  Vital Signs: Temp: 98.3 F (36.8 C) (11/17 1131) Temp Source: Oral (11/17 1131) BP: 156/72 (11/17 1540) Pulse Rate: 85 (11/17 1541)  Labs: Recent Labs    04/13/24 1246  HGB 13.2  HCT 42.5  PLT 167  CREATININE 1.11*    Estimated Creatinine Clearance: 30 mL/min (A) (by C-G formula based on SCr of 1.11 mg/dL (H)).   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  Patient is not on anticoagulation prior to admission.  Plan is to transfer to [facility] for vascular consult.  Goal of Therapy:  Heparin  level 0.3-0.7 units/ml Monitor platelets by anticoagulation protocol: Yes   Plan:  Give 3000 units bolus x 1 Start  heparin  infusion at 800 units/hr Check anti-Xa level in 6-8 hours and daily while on heparin  Continue to monitor H&H and platelets  Elspeth Sour, PharmD Clinical Pharmacist 04/13/2024 4:41 PM

## 2024-04-13 NOTE — Telephone Encounter (Signed)
 Fyi going to ED

## 2024-04-13 NOTE — ED Notes (Signed)
 Attempted to call report to 5N at [facility]. Nurse will give a call back.

## 2024-04-14 ENCOUNTER — Other Ambulatory Visit: Payer: Self-pay

## 2024-04-14 ENCOUNTER — Encounter (HOSPITAL_COMMUNITY)
Admission: EM | Disposition: A | Discharge status: Home Health | Payer: Self-pay | Source: Home / Self Care | Attending: Internal Medicine

## 2024-04-14 DIAGNOSIS — M7989 Other specified soft tissue disorders: Secondary | ICD-10-CM

## 2024-04-14 DIAGNOSIS — Z87891 Personal history of nicotine dependence: Secondary | ICD-10-CM | POA: Diagnosis not present

## 2024-04-14 DIAGNOSIS — I70291 Other atherosclerosis of native arteries of extremities, right leg: Secondary | ICD-10-CM | POA: Diagnosis not present

## 2024-04-14 DIAGNOSIS — I70221 Atherosclerosis of native arteries of extremities with rest pain, right leg: Secondary | ICD-10-CM | POA: Diagnosis not present

## 2024-04-14 DIAGNOSIS — L97519 Non-pressure chronic ulcer of other part of right foot with unspecified severity: Secondary | ICD-10-CM

## 2024-04-14 DIAGNOSIS — I70235 Atherosclerosis of native arteries of right leg with ulceration of other part of foot: Secondary | ICD-10-CM | POA: Diagnosis not present

## 2024-04-14 DIAGNOSIS — I96 Gangrene, not elsewhere classified: Secondary | ICD-10-CM | POA: Diagnosis not present

## 2024-04-14 HISTORY — PX: LOWER EXTREMITY INTERVENTION: CATH118252

## 2024-04-14 HISTORY — PX: ABDOMINAL AORTOGRAM W/LOWER EXTREMITY: CATH118223

## 2024-04-14 HISTORY — PX: LOWER EXTREMITY ANGIOGRAPHY: CATH118251

## 2024-04-14 LAB — HEPARIN LEVEL (UNFRACTIONATED)
Heparin Unfractionated: 0.39 [IU]/mL (ref 0.30–0.70)
Heparin Unfractionated: 0.42 [IU]/mL (ref 0.30–0.70)

## 2024-04-14 LAB — CBC
HCT: 35 % — ABNORMAL LOW (ref 36.0–46.0)
Hemoglobin: 11.1 g/dL — ABNORMAL LOW (ref 12.0–15.0)
MCH: 29.3 pg (ref 26.0–34.0)
MCHC: 31.7 g/dL (ref 30.0–36.0)
MCV: 92.3 fL (ref 80.0–100.0)
Platelets: 181 K/uL (ref 150–400)
RBC: 3.79 MIL/uL — ABNORMAL LOW (ref 3.87–5.11)
RDW: 13 % (ref 11.5–15.5)
WBC: 4.6 K/uL (ref 4.0–10.5)
nRBC: 0 % (ref 0.0–0.2)

## 2024-04-14 LAB — BASIC METABOLIC PANEL WITH GFR
Anion gap: 12 (ref 5–15)
BUN: 8 mg/dL (ref 8–23)
CO2: 23 mmol/L (ref 22–32)
Calcium: 8.3 mg/dL — ABNORMAL LOW (ref 8.9–10.3)
Chloride: 105 mmol/L (ref 98–111)
Creatinine, Ser: 1.06 mg/dL — ABNORMAL HIGH (ref 0.44–1.00)
GFR, Estimated: 53 mL/min — ABNORMAL LOW (ref >60)
Glucose, Bld: 93 mg/dL (ref 70–99)
Potassium: 4 mmol/L (ref 3.5–5.1)
Sodium: 140 mmol/L (ref 135–145)

## 2024-04-14 LAB — GLUCOSE, CAPILLARY: Glucose-Capillary: 70 mg/dL (ref 70–99)

## 2024-04-14 SURGERY — LOWER EXTREMITY ANGIOGRAPHY
Anesthesia: LOCAL

## 2024-04-14 MED ORDER — MIDAZOLAM HCL (PF) 2 MG/2ML IJ SOLN
INTRAMUSCULAR | Status: DC | PRN
  Administered 2024-04-14: 1 mg via INTRAVENOUS

## 2024-04-14 MED ORDER — HYDROCODONE-ACETAMINOPHEN 5-325 MG PO TABS
ORAL_TABLET | ORAL | Status: AC
  Filled 2024-04-14: qty 1

## 2024-04-14 MED ORDER — HEPARIN SODIUM (PORCINE) 1000 UNIT/ML IJ SOLN
INTRAMUSCULAR | Status: DC | PRN
  Administered 2024-04-14: 6000 [IU] via INTRAVENOUS

## 2024-04-14 MED ORDER — FENTANYL CITRATE (PF) 100 MCG/2ML IJ SOLN
INTRAMUSCULAR | Status: DC | PRN
  Administered 2024-04-14 (×2): 50 ug via INTRAVENOUS

## 2024-04-14 MED ORDER — CLOPIDOGREL BISULFATE 300 MG PO TABS
ORAL_TABLET | ORAL | Status: AC
Start: 2024-04-14 — End: 2024-04-14
  Filled 2024-04-14: qty 1

## 2024-04-14 MED ORDER — LABETALOL HCL 5 MG/ML IV SOLN: 10.0000 mg | INTRAVENOUS | Status: DC | PRN

## 2024-04-14 MED ORDER — SODIUM CHLORIDE 0.9 % IV SOLN: 250.0000 mL | INTRAVENOUS | Status: AC | PRN

## 2024-04-14 MED ORDER — ASPIRIN 81 MG PO CHEW
CHEWABLE_TABLET | ORAL | Status: DC | PRN
  Administered 2024-04-14: 81 mg via ORAL

## 2024-04-14 MED ORDER — HYDRALAZINE HCL 20 MG/ML IJ SOLN: 5.0000 mg | INTRAMUSCULAR | Status: DC | PRN

## 2024-04-14 MED ORDER — HEPARIN (PORCINE) IN NACL 1000-0.9 UT/500ML-% IV SOLN
INTRAVENOUS | Status: DC | PRN
Start: 2024-04-14 — End: 2024-04-14
  Administered 2024-04-14: 1000 mL

## 2024-04-14 MED ORDER — ASPIRIN 81 MG PO CHEW
CHEWABLE_TABLET | ORAL | Status: AC
  Filled 2024-04-14: qty 1

## 2024-04-14 MED ORDER — SODIUM CHLORIDE 0.9 % WEIGHT BASED INFUSION: 1.0000 mL/kg/h | INTRAVENOUS | Status: AC

## 2024-04-14 MED ORDER — ATORVASTATIN CALCIUM 80 MG PO TABS
80.0000 mg | ORAL_TABLET | Freq: Every day | ORAL | Status: DC
  Administered 2024-04-14 – 2024-04-16 (×3): 80 mg via ORAL
  Filled 2024-04-14 (×3): qty 1

## 2024-04-14 MED ORDER — CLOPIDOGREL BISULFATE 75 MG PO TABS
75.0000 mg | ORAL_TABLET | Freq: Every day | ORAL | Status: DC
  Administered 2024-04-15 – 2024-04-16 (×2): 75 mg via ORAL
  Filled 2024-04-14 (×2): qty 1

## 2024-04-14 MED ORDER — HYDRALAZINE HCL 20 MG/ML IJ SOLN
INTRAMUSCULAR | Status: DC | PRN
  Administered 2024-04-14: 5 mg via INTRAVENOUS
  Administered 2024-04-14: 10 mg via INTRAVENOUS

## 2024-04-14 MED ORDER — LIDOCAINE HCL (PF) 1 % IJ SOLN
INTRAMUSCULAR | Status: DC | PRN
  Administered 2024-04-14: 10 mL via INTRADERMAL

## 2024-04-14 MED ORDER — FENTANYL CITRATE (PF) 100 MCG/2ML IJ SOLN
INTRAMUSCULAR | Status: AC
  Filled 2024-04-14: qty 2

## 2024-04-14 MED ORDER — ASPIRIN 81 MG PO TBEC
81.0000 mg | DELAYED_RELEASE_TABLET | Freq: Every day | ORAL | Status: DC
  Administered 2024-04-15 – 2024-04-16 (×2): 81 mg via ORAL
  Filled 2024-04-14 (×2): qty 1

## 2024-04-14 MED ORDER — LIDOCAINE HCL (PF) 1 % IJ SOLN
INTRAMUSCULAR | Status: AC
  Filled 2024-04-14: qty 30

## 2024-04-14 MED ORDER — MIDAZOLAM HCL 2 MG/2ML IJ SOLN
INTRAMUSCULAR | Status: AC
  Filled 2024-04-14: qty 2

## 2024-04-14 MED ORDER — HYDRALAZINE HCL 20 MG/ML IJ SOLN
INTRAMUSCULAR | Status: AC
  Filled 2024-04-14: qty 1

## 2024-04-14 MED ORDER — MORPHINE SULFATE (PF) 2 MG/ML IV SOLN
INTRAVENOUS | Status: AC
  Filled 2024-04-14: qty 1

## 2024-04-14 MED ORDER — HEPARIN SODIUM (PORCINE) 1000 UNIT/ML IJ SOLN
INTRAMUSCULAR | Status: AC
  Filled 2024-04-14: qty 10

## 2024-04-14 MED ORDER — SODIUM CHLORIDE 0.9% FLUSH
3.0000 mL | Freq: Two times a day (BID) | INTRAVENOUS | Status: DC
  Administered 2024-04-14 – 2024-04-16 (×4): 3 mL via INTRAVENOUS

## 2024-04-14 MED ORDER — METHOCARBAMOL 500 MG PO TABS
500.0000 mg | ORAL_TABLET | Freq: Four times a day (QID) | ORAL | Status: DC | PRN
  Administered 2024-04-15: 500 mg via ORAL
  Filled 2024-04-14: qty 1

## 2024-04-14 MED ORDER — SODIUM CHLORIDE 0.9% FLUSH: 3.0000 mL | INTRAVENOUS | Status: DC | PRN

## 2024-04-14 MED ORDER — FENTANYL CITRATE (PF) 50 MCG/ML IJ SOSY: 25.0000 ug | PREFILLED_SYRINGE | INTRAMUSCULAR | Status: DC | PRN

## 2024-04-14 MED ORDER — MORPHINE SULFATE (PF) 2 MG/ML IV SOLN
2.0000 mg | Freq: Once | INTRAVENOUS | Status: AC
  Administered 2024-04-14: 2 mg via INTRAVENOUS

## 2024-04-14 MED ORDER — OXYCODONE HCL 5 MG PO TABS
2.5000 mg | ORAL_TABLET | ORAL | Status: DC | PRN
  Administered 2024-04-15 (×2): 5 mg via ORAL
  Filled 2024-04-14 (×2): qty 1

## 2024-04-14 MED ORDER — IODIXANOL 320 MG/ML IV SOLN
INTRAVENOUS | Status: DC | PRN
  Administered 2024-04-14: 60 mL via INTRA_ARTERIAL

## 2024-04-14 MED ORDER — CLOPIDOGREL BISULFATE 300 MG PO TABS
ORAL_TABLET | ORAL | Status: DC | PRN
Start: 2024-04-14 — End: 2024-04-14
  Administered 2024-04-14: 300 mg via ORAL

## 2024-04-14 MED ORDER — METHOCARBAMOL 500 MG PO TABS
ORAL_TABLET | ORAL | Status: AC
  Administered 2024-04-14: 500 mg via ORAL
  Filled 2024-04-14: qty 1

## 2024-04-14 SURGICAL SUPPLY — 22 items
BALLOON MUSTANG 5X120X135 (BALLOONS) IMPLANT
CANISTER PENUMBRA ENGINE (MISCELLANEOUS) IMPLANT
CATH INDIGO CAT6 KIT (CATHETERS) IMPLANT
CATH OMNI FLUSH 5F 65CM (CATHETERS) IMPLANT
CATH QUICKCROSS SUPP .035X90CM (MICROCATHETER) IMPLANT
CLOSURE MYNX CONTROL 6F/7F (Vascular Products) IMPLANT
COVER DOME SNAP 22 D (MISCELLANEOUS) IMPLANT
DEVICE TORQUE SEADRAGON GRN (MISCELLANEOUS) IMPLANT
GUIDEWIRE ANGLED .035 180CM (WIRE) IMPLANT
KIT ENCORE 26 ADVANTAGE (KITS) IMPLANT
KIT MICROPUNCTURE NIT STIFF (SHEATH) IMPLANT
KIT SINGLE USE MANIFOLD (KITS) IMPLANT
SET ATX-X65L (MISCELLANEOUS) IMPLANT
SHEATH CATAPULT 6FR 45 (SHEATH) IMPLANT
SHEATH PINNACLE 5F 10CM (SHEATH) IMPLANT
SHEATH PINNACLE 6F 10CM (SHEATH) IMPLANT
SHEATH PROBE COVER 6X72 (BAG) IMPLANT
STENT ELUVIA 6X150X130 (Permanent Stent) IMPLANT
TRAY PV CATH (CUSTOM PROCEDURE TRAY) ×2 IMPLANT
WIRE BENTSON .035X145CM (WIRE) IMPLANT
WIRE HI TORQ VERSACORE 300 (WIRE) IMPLANT
WIRE SPARTACORE .014X300CM (WIRE) IMPLANT

## 2024-04-14 NOTE — Progress Notes (Signed)
 PHARMACIST LIPID MONITORING   [name] is a 83 y.o. female admitted on 04/13/2024 with CLI.  Pharmacy has been consulted to optimize lipid-lowering therapy with the indication of secondary prevention for clinical ASCVD.  Recent Labs:  Lipid Panel (last 6 months):   No results found for: CHOL, TRIG, HDL, CHOLHDL, VLDL, LDLCALC, LDLDIRECT  Hepatic function panel (last 6 months):   Lab Results  Component Value Date   AST 14 03/17/2024   ALT 5 03/17/2024   ALKPHOS 69 03/17/2024   BILITOT <0.2 03/17/2024    SCr (since admission):   Serum creatinine: 1.06 mg/dL (H) 88/81/74 9552 Estimated creatinine clearance: 31.4 mL/min (A)  Current therapy and lipid therapy tolerance Current lipid-lowering therapy: none Previous lipid-lowering therapies (if applicable): atorvastatin  Documented or reported allergies or intolerances to lipid-lowering therapies (if applicable): none  Assessment:   Patient agrees with changes to lipid-lowering therapy  Plan:    1.Statin intensity (high intensity recommended for all patients regardless of the LDL):  Add or increase statin to high intensity.  2.Add ezetimibe  (if any one of the following):   Not indicated at this time.  3.Refer to lipid clinic:   No  4.Follow-up with:  Primary care provider - Zollie Lowers, MD  5.Follow-up labs after discharge:  Changes in lipid therapy were made. Check a lipid panel in 8-12 weeks then annually.       [name], PharmD 04/14/2024, 2:14 PM

## 2024-04-14 NOTE — Progress Notes (Signed)
 PHARMACY - ANTICOAGULATION  Pharmacy Consult for heparin  Indication: PAD  Brief A/P: Heparin  level within goal range Continue Heparin  at current rate   Allergies  Allergen Reactions   Penicillins Anaphylaxis, Swelling, Rash and Other (See Comments)    Immediate rash, facial/tongue/throat swelling, SOB or lightheadedness with hypotension    Patient Measurements: Height: 5' 1 (154.9 cm) Weight: 50.8 kg (112 lb) IBW/kg (Calculated) : 47.8 HEPARIN  DW (KG): 50.8  Vital Signs: Temp: 98.4 F (36.9 C) (11/17 2113) Temp Source: Oral (11/17 2113) BP: 137/60 (11/17 2113) Pulse Rate: 72 (11/17 2113)  Labs: Recent Labs    04/13/24 1246 04/14/24 0031  HGB 13.2  --   HCT 42.5  --   PLT 167  --   HEPARINUNFRC  --  0.42  CREATININE 1.11*  --     Estimated Creatinine Clearance: 30 mL/min (A) (by C-G formula based on SCr of 1.11 mg/dL (H)).   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 180 tablet PRN    albuterol  (VENTOLIN  HFA) 108 (90 Base) MCG/ACT inhaler Inhale 2 puffs  into the lungs every 4 (four) hours as needed for wheezing or shortness of breath. 6.7 each 2    alendronate  (FOSAMAX ) 70 MG tablet Take 1 tablet (70 mg total) by mouth every 7 (seven) days. Take with a full glass of water  on an empty stomach. 13 tablet 3    amLODipine -olmesartan  (AZOR ) 10-40 MG tablet Take 1 tablet by mouth daily. For blood pressure. 90 tablet 3    atorvastatin  (LIPITOR ) 80 MG tablet Take 1 tablet (80 mg total) by mouth daily. 90 tablet 3    cloNIDine  (CATAPRES ) 0.2 MG tablet Take 1.5 tablets morning and evening, and 1 tablet mid-day. 120 tablet 3    clopidogrel  (PLAVIX ) 75 MG tablet Take 1 tablet (75 mg total) by mouth daily. 90 tablet 3    fluticasone  furoate-vilanterol (BREO ELLIPTA ) 200-25 MCG/ACT AEPB Inhale 1 puff into the lungs daily. 30 each 5    furosemide  (LASIX ) 20 MG tablet TAKE 1 TABLET (20 MG TOTAL) BY MOUTH DAILY. FOR SWELLING 90 tablet 3    isosorbide  mononitrate (IMDUR ) 60 MG 24 hr tablet Take 1 tablet (60 mg total) by mouth daily. 90 tablet 3    Magnesium  250 MG TABS Take 1 tablet (250 mg total) by mouth daily. 100 tablet PRN    neomycin -polymyxin-hydrocortisone (CORTISPORIN) 3.5-10000-1 ophthalmic suspension Place 3 drops into both eyes 4 (four) times daily. 7.5 mL 0    pantoprazole  (PROTONIX ) 40 MG tablet Take  1 tablet (40 mg total) by mouth daily. 30 tablet 5    potassium chloride  SA (KLOR-CON  M) 20 MEQ tablet Take 1 tablet (20 mEq total) by mouth daily. For potassium replacement/ supplement 30 tablet 5    pregabalin  (LYRICA ) 300 MG capsule Take 1 capsule (300 mg total) by mouth at bedtime. To reduce back pain 90 capsule 3    rOPINIRole  (REQUIP ) 1 MG tablet TAKE 1 TABLET (1 MG TOTAL) BY MOUTH AT BEDTIME. FOR LEG CRAMPS 90 tablet 0    tizanidine  (ZANAFLEX ) 2 MG capsule Take 2 mg by mouth at bedtime.      vitamin B-12 1000 MCG tablet Take 1 tablet (1,000 mcg total) by mouth daily. 30 tablet 2     Assessment: 83 y.o. female with PAD and RLE ischemia for  heparin   Goal of Therapy:  Heparin  level 0.3-0.7 units/ml Monitor platelets by anticoagulation protocol: Yes   Plan:  No change to heparin  Follow-up am labs.   Cathlyn Arrant, PharmD, BCPS  04/14/2024 1:04 AM

## 2024-04-14 NOTE — Progress Notes (Signed)
 Patient complained of left chest pain but unable to describe what type of pain, vital signs checked and informed Lynwood Kipper NP, EKG performed as patient is not on telemetry.

## 2024-04-14 NOTE — Consult Note (Signed)
 Hospital Consult    Reason for Consult: Right lower extremity critical ischemia with tissue loss at the fifth toe Requesting Physician: ED MRN #:  [id number]  History of Present Illness: This is a 83 y.o. female who presents as a transfer from OSH with a several week history of bluish discoloration to the right fifth toe.  She states there is a wound, which has not healed.  She had nonpalpable pulses on exam, and underwent ABI which demonstrated arterial insufficiency bilaterally.  Absent toe pressure on the right.  CT demonstrated occlusion of the superficial femoral artery, segment of the popliteal artery.  Vessel surgery was called for further recommendations.  On exam, Shuronda was doing well.  She had no complaints of the toe.  A truck driver by trade, she moved to [place]  after living in several states.  She has family in [place] , but continues to live in [place]  with her husband.  She remains very active and independent, and drove herself to [facility] yesterday for evaluation.  She does not want to lose her leg.    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   Ileus (HCC) 05/15/2021   Influenza A 07/15/2023   NSTEMI (non-ST elevated myocardial infarction) (HCC) 07/10/2023   Stroke (HCC)    SLIGHT RT SIDE WEAKNESS 2001   Unintentional weight loss 03/19/2022    Past Surgical History:  Procedure Laterality Date   ABDOMINAL HYSTERECTOMY     ANTERIOR CERVICAL CORPECTOMY   03/07/2012   Procedure: ANTERIOR CERVICAL CORPECTOMY;  Surgeon: Victory Gens, MD;  Location: [facility];  Service: Neurosurgery;  Laterality: N/A;  Cervical six-seven, cervical seven-thoracic one Anterior cervical decompression/diskectomy/fusion, with Cervical seven Corpectomy, reconstruction using Allograft and Alphatec plate   CATARACT EXTRACTION W/PHACO Left 04/12/2017   Procedure: CATARACT EXTRACTION PHACO AND INTRAOCULAR LENS PLACEMENT (IOC);  Surgeon: Harrie Agent, MD;  Location: [facility] OR[facility];  Service: Ophthalmology;  Laterality: Left;  CDE: 3.82   CATARACT EXTRACTION W/PHACO Right 05/03/2017   Procedure: CATARACT EXTRACTION PHACO AND INTRAOCULAR LENS PLACEMENT RIGHT EYE;  Surgeon: Harrie Agent, MD;  Location: [facility];  Service: Ophthalmology;  Laterality: Right;  CDE: 3.89   LEFT HEART CATH AND CORONARY ANGIOGRAPHY N/A 07/11/2023   Procedure: LEFT HEART CATH AND CORONARY ANGIOGRAPHY;  Surgeon: Elmira Newman PARAS, MD;  Location: [facility] IN[facility] CV [facility];  Service: Cardiovascular;  Laterality: N/A;   MULTIPLE TOOTH EXTRACTIONS     TONSILLECTOMY      Allergies  Allergen Reactions   Penicillins Anaphylaxis, Swelling, Rash and Other (See Comments)    Immediate rash, facial/tongue/throat swelling, SOB or lightheadedness with hypotension    Prior to Admission medications   Medication Sig Start Date End Date Taking? Authorizing Provider  acetaminophen  (TYLENOL ) 500 MG tablet Take 2 tablets (1,000 mg total) by mouth 3 (three) times daily. 10/30/22   Zollie Lowers, MD  albuterol  (VENTOLIN  HFA) 108 [phone] Base) MCG/ACT inhaler Inhale 2 puffs into the lungs every  4 (four) hours as needed for wheezing or shortness of breath. 03/13/24   Pearlean Manus, MD  alendronate  (FOSAMAX ) 70 MG tablet Take 1 tablet (70 mg total) by mouth every 7 (seven) days. Take with a full glass of water  on an empty stomach. 02/25/24   Zollie Lowers, MD  amLODipine -olmesartan  (AZOR ) 10-40 MG tablet Take 1 tablet by mouth daily. For  blood pressure. 03/13/24   Pearlean Manus, MD  atorvastatin  (LIPITOR ) 80 MG tablet Take 1 tablet (80 mg total) by mouth daily. 06/11/23   Walker, Caitlin S, NP  cloNIDine  (CATAPRES ) 0.2 MG tablet Take 1.5 tablets morning and evening, and 1 tablet mid-day. 10/29/23   Raford Riggs, MD  clopidogrel  (PLAVIX ) 75 MG tablet Take 1 tablet (75 mg total) by mouth daily. 03/13/24   Pearlean Manus, MD  fluticasone  furoate-vilanterol (BREO ELLIPTA ) 200-25 MCG/ACT AEPB Inhale 1 puff into the lungs daily. 03/13/24   Pearlean Manus, MD  furosemide  (LASIX ) 20 MG tablet TAKE 1 TABLET (20 MG TOTAL) BY MOUTH DAILY. FOR SWELLING 12/09/23   Raford Riggs, MD  isosorbide  mononitrate (IMDUR ) 60 MG 24 hr tablet Take 1 tablet (60 mg total) by mouth daily. 03/13/24   Pearlean Manus, MD  Magnesium  250 MG TABS Take 1 tablet (250 mg total) by mouth daily. 06/18/23   Zollie Lowers, MD  neomycin -polymyxin-hydrocortisone (CORTISPORIN) 3.5-10000-1 ophthalmic suspension Place 3 drops into both eyes 4 (four) times daily. 12/16/23   Zollie Lowers, MD  pantoprazole  (PROTONIX ) 40 MG tablet Take 1 tablet (40 mg total) by mouth daily. 03/13/24   Pearlean Manus, MD  potassium chloride  SA (KLOR-CON  M) 20 MEQ tablet Take 1 tablet (20 mEq total) by mouth daily. For potassium replacement/ supplement 12/16/23   Zollie Lowers, MD  pregabalin  (LYRICA ) 300 MG capsule Take 1 capsule (300 mg total) by mouth at bedtime. To reduce back pain 02/25/24 02/19/25  Zollie Lowers, MD  rOPINIRole  (REQUIP ) 1 MG tablet TAKE 1 TABLET (1 MG TOTAL) BY MOUTH AT BEDTIME. FOR LEG CRAMPS 03/10/24   Zollie Lowers, MD  tizanidine  (ZANAFLEX ) 2 MG capsule Take 2 mg by mouth at bedtime.    [provider]  vitamin B-12 1000 MCG tablet Take 1 tablet (1,000 mcg total) by mouth daily. 09/21/22   Vicci Afton CROME, MD    Social History   Socioeconomic History   Marital status: Widowed    Spouse name: Not on file   Number of children: 5   Years of  education: 19   Highest education level: 12th grade  Occupational History   Occupation: retired    Comment: CNA  Tobacco Use   Smoking status: Former    Current packs/day: 0.00    Average packs/day: 0.5 packs/day for 61.8 years (30.9 ttl pk-yrs)    Types: Cigarettes    Start date: 29    Quit date: 03/24/2022    Years since quitting: 2.0    Passive exposure: Past   Smokeless tobacco: Never  Vaping Use   Vaping status: Never Used  Substance and Sexual Activity   Alcohol use: Not Currently   Drug use: No   Sexual activity: Not Currently    Partners: Male    Birth control/protection: Surgical  Other Topics Concern   Not on file  Social History Narrative   5 children living, 1 deceased.    Children live out of state.   Social Drivers of Health   Financial Resource Strain: Medium Risk (03/20/2024)   Overall Financial Resource Strain (CARDIA)  Difficulty of Paying Living Expenses: Somewhat hard  Food Insecurity: No Food Insecurity (04/13/2024)   Hunger Vital Sign    Worried About Running Out of Food in the Last Year: Never true    Ran Out of Food in the Last Year: Never true  Transportation Needs: Unmet Transportation Needs (04/13/2024)   PRAPARE - Administrator, Civil Service (Medical): Yes    Lack of Transportation (Non-Medical): Yes  Physical Activity: Insufficiently Active (11/14/2023)   Exercise Vital Sign    Days of Exercise per Week: 7 days    Minutes of Exercise per Session: 10 min  Stress: Stress Concern Present (11/14/2023)   [employer] of Occupational Health - Occupational Stress Questionnaire    Feeling of Stress: Rather much  Social Connections: Socially Integrated (04/13/2024)   Social Connection and Isolation Panel    Frequency of Communication with Friends and Family: More than three times a week    Frequency of Social Gatherings with Friends and Family: More than three times a week    Attends Religious Services: More than 4 times per  year    Active Member of [employer] or Organizations: Yes    Attends Banker Meetings: More than 4 times per year    Marital Status: Living with partner  Intimate Partner Violence: Not At Risk (04/13/2024)   Humiliation, Afraid, Rape, and Kick questionnaire    Fear of Current or Ex-Partner: No    Emotionally Abused: No    Physically Abused: No    Sexually Abused: No   Family History  Adopted: Yes  Problem Relation Age of Onset   Bipolar disorder Daughter    Heart disease Daughter    Asthma Daughter    Bipolar disorder Son    Bipolar disorder Daughter    Bipolar disorder Daughter    Bipolar disorder Daughter    Hypertension Daughter    Heart disease Daughter    Drug abuse Daughter        OD    ROS: Otherwise negative unless mentioned in HPI  Physical Examination  Vitals:   04/14/24 0540 04/14/24 0748  BP: (!) 156/91 (!) 145/55  Pulse: 64 65  Resp: 18 17  Temp:  98.1 F (36.7 C)  SpO2: 96% 96%   Body mass index is 21.16 kg/m.  General:  WDWN in NAD Gait: Not observed HENT: WNL, normocephalic Pulmonary: normal non-labored breathing, without Rales, rhonchi,  wheezing Cardiac: regular Abdomen:  soft, NT/ND, no masses Skin: without rashes Vascular Exam/Pulses: 2+ femorals, nonpalpable pulse in the right foot, palpable DP in the left foot Extremities: without ischemic changes, without Gangrene , without cellulitis; without open wounds;  Musculoskeletal: no muscle wasting or atrophy  Neurologic: A&O X 3;  No focal weakness or paresthesias are detected; speech is fluent/normal Psychiatric:  The pt has Normal affect. Lymph:  Unremarkable  CBC    Component Value Date/Time   WBC 4.6 04/14/2024 0447   RBC 3.79 (L) 04/14/2024 0447   HGB 11.1 (L) 04/14/2024 0447   HGB 12.9 03/17/2024 0842   HCT 35.0 (L) 04/14/2024 0447   HCT 41.0 03/17/2024 0842   PLT 181 04/14/2024 0447   PLT 306 03/17/2024 0842   MCV 92.3 04/14/2024 0447   MCV 97 03/17/2024 0842   MCH  29.3 04/14/2024 0447   MCHC 31.7 04/14/2024 0447   RDW 13.0 04/14/2024 0447   RDW 12.2 03/17/2024 0842   LYMPHSABS 1.4 03/17/2024 0842   MONOABS 0.2 03/25/2022 0818  EOSABS 0.2 03/17/2024 0842   BASOSABS 0.0 03/17/2024 0842    BMET    Component Value Date/Time   NA 140 04/14/2024 0447   NA 146 (H) 03/17/2024 0842   K 4.0 04/14/2024 0447   CL 105 04/14/2024 0447   CO2 23 04/14/2024 0447   GLUCOSE 93 04/14/2024 0447   BUN 8 04/14/2024 0447   BUN 8 03/17/2024 0842   CREATININE 1.06 (H) 04/14/2024 0447   CALCIUM  8.3 (L) 04/14/2024 0447   GFRNONAA 53 (L) 04/14/2024 0447   GFRAA 53 (L) 01/23/2020 0700    COAGS: Lab Results  Component Value Date   INR 0.9 03/08/2024   INR 0.9 06/06/2007      ASSESSMENT/PLAN: This is a 83 y.o. female who presents with right lower extremity critical and ischemia with tissue loss.  She has poor perfusion to the foot with ischemic changes to the right fifth toe.  No toe pressure on ABI.  No palpable pulse on exam.  Had a long conversation with Carys regarding the above.  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  I think that she would benefit from right lower extremity angiography in an effort to define and improve distal perfusion for wound healing.  I discussed her case with my partner, who who, agreed to perform her intervention today.  After discussing the risks and benefits Caliyah to proceed.     Fonda FORBES Rim MD MS [facility] [phone] 04/14/2024  8:14 AM

## 2024-04-14 NOTE — Progress Notes (Signed)
 PROGRESS NOTE  [name]  DOB: 11-10-1940  PCP: Zollie Lowers, MD FMW:[id number]  DOA: 04/13/2024  LOS: 1 day  Hospital Day: 2  Subjective: Patient was seen and examined this afternoon. Pleasant elderly African-American female lying on bed.  Not in distress. Underwent angiogram this morning.  Brief narrative: [name] is a 83 y.o. female with PMH significant for HTN, HLD, CAD, CHF, h/o stroke with slight right-sided weakness, gout. COPD, renal artery stenosis, former smoker quit 2 years ago. 11/17, patient presented from home to ED at [facility] with complaint bilateral foot pain and swelling progressively worsening for last 2 weeks. 11/2, she was seen in outside ED at [facility], then follow-up with PCP, had ABIs done which showed severe RLE arterial disease and was hence referred to vascular surgery. Symptoms continue to worsen and has presented to ED on 11/17  In the ED, hemodynamically stable CBC and BMP unremarkable Chest x-ray unremarkable CT angio aorta -bifemoral showed 1. Complete occlusion of the proximal to mid right superficial femoral artery with distal reconstitution and diminished caliber to 3 mm, noting limited evaluation due to partial collimation off-view. 2. Complete occlusion of the origin of the right popliteal artery (~6 cm length) with distal collateral reconstitution. 3. Complete occlusion of the right mid peroneal artery with diminished flow to the right foot via the anterior and posterior tibial arteries. 4. Severe atherosclerotic disease of the abdominal aorta, iliac, and femoral arteries. 5. Left superficial femoral artery stenosis with luminal caliber narrowed to 3 mm without complete occlusion. 3-vessel runoff to the left foot and ankle.  Vascular surgery consulted, recommended transfer to [facility]. Admitted to [facility]  Assessment and plan: RLE critical ischemia Right fifth toe ischemic changes Symptoms progressively worsening for last few  weeks now with blue toe syndrome Imagings as above.   Heparin  drip was started on admission. Seen by vascular surgery Underwent aortobifemoral angiogram, right superficial femoral and popliteal artery stenting and mechanical thrombectomy this morning. PTA meds- Plavix  75 mg Currently continued on Plavix .  Chronic diastolic CHF Hypertension Heart failure remains compensated.  Blood pressure stable Last echo 06/2023 EF of 55 to 60%, grade 1 DD.  PTA meds- clonidine  0.3 mg twice daily amlodipine  10 mg daily, Imdur  60 mg daily, Lasix  20 mg daily, olmesartan  40 mg daily.  Unclear if patient was taking all these medications. Currently continued on amlodipine  10 mg daily, Imdur  60 mg daily IV hydralazine  as needed  HLD Lipitor  80 mg daily  COPD/tobacco abuse  lung exam stable. Continue bronchodilators   Restless legs Continue ropinirole  1 mg nightly  Neuropathy Lyrica  300 mg nightly  Home med list has not been obtained/updated by [facility] at this time.  Please double check admission reconciliation.     Mobility: Uses a cane at home.  Needs PT eval  Goals of care   Code Status: Full Code     DVT prophylaxis: Heparin  drip    Antimicrobials: None Fluid: None Consultants: Vascular surgery Family Communication: None at bedside  Status: Inpatient Level of care:  Telemetry   Patient is from: Home Needs to continue in-hospital care: POD 0, pending PT eval Anticipated d/c to: Hopefully home in 1 to 2 days      Diet:  Diet Order             Diet regular Room service appropriate? Yes; Fluid consistency: Thin  Diet effective now  Scheduled Meds:  amLODipine   10 mg Oral Daily   aspirin  EC  81 mg Oral Daily   atorvastatin   80 mg Oral Daily   [START ON 04/15/2024] clopidogrel   75 mg Oral Q breakfast   HYDROcodone-acetaminophen        isosorbide  mononitrate  60 mg Oral Daily   morphine  (PF)       rOPINIRole   1 mg Oral QHS   sodium chloride   flush  3 mL Intravenous Q12H    PRN meds: sodium chloride , acetaminophen  **OR** acetaminophen , hydrALAZINE , HYDROcodone-acetaminophen , labetalol , morphine  (PF), ondansetron  **OR** ondansetron  (ZOFRAN ) IV, polyethylene glycol, sodium chloride  flush   Infusions:   sodium chloride      sodium chloride  1 mL/kg/hr (04/14/24 1358)    Antimicrobials: Anti-infectives (From admission, onward)    None       Objective: Vitals:   04/14/24 1315 04/14/24 1355  BP: (!) 150/68 (!) 164/76  Pulse: 90 92  Resp: 11 15  Temp:  98.1 F (36.7 C)  SpO2: 97% 98%    Intake/Output Summary (Last 24 hours) at 04/14/2024 1532 Last data filed at 04/14/2024 1530 Gross per 24 hour  Intake 482.07 ml  Output --  Net 482.07 ml   Filed Weights   04/13/24 1134  Weight: 50.8 kg   Weight change:  Body mass index is 21.16 kg/m.   Physical Exam: General exam: Pleasant, elderly African-American female.  Not in distress Skin: No rashes, lesions or ulcers. HEENT: Atraumatic, normocephalic, no obvious bleeding Lungs: Clear to auscultation bilaterally,  CVS: S1, S2, no murmur,    GI/Abd: Soft, nontender, nondistended, bowel sound present,   CNS: alert, awake, oriented x 3 Psychiatry: Mood appropriate Extremities: No pedal edema, no calf tenderness,   Data Review: I have personally reviewed the laboratory data and studies available.  F/u labs  Unresulted Labs (From admission, onward)     Start     Ordered   04/15/24 0500  Lipid panel  (Labs - [place] only)  Tomorrow morning,   R        04/14/24 1126   04/14/24 0500  CBC  Daily,   R      04/13/24 2047   Unscheduled  Basic metabolic panel with GFR  Tomorrow morning,   R        04/14/24 1532            Signed, Chapman Rota, MD [facility] 04/14/2024

## 2024-04-14 NOTE — Op Note (Signed)
 Patient name: [name] MRN: [id number] DOB: August 27, 1940 Sex: female  04/14/2024 Pre-operative Diagnosis: Right toe ulcer Post-operative diagnosis:  Same Surgeon:  Malvina New Procedure Performed:  1.  Ultrasound-guided access, left femoral artery  2.  Aortobifemoral angiogram  3.  Right leg angiogram  4.  Selective injection with catheter in right superficial femoral artery  5.  Stent, right superficial femoral and popliteal artery  6.  Mechanical thrombectomy, right superficial femoral-popliteal artery  7.  Conscious sedation, 48 minutes  8.  Closure was, Mynx   Indications: This is a 83 year old female with right fifth toe ulcer.  She comes in today for angiographic evaluation and possible invention.  Procedure:  The patient was identified in the holding area and taken to room 8.  The patient was then placed supine on the table and prepped and draped in the usual sterile fashion.  A time out was called.  Conscious sedation was administered with the use of IV fentanyl  and Versed  under continuous physician and nurse monitoring.  Heart rate, blood pressure, and oxygen saturation were continuously monitored.  Total sedation time was 48 minutes.  Ultrasound was used to evaluate the left common femoral artery.  It was patent .  A digital ultrasound image was acquired.  A micropuncture needle was used to access the left common femoral artery under ultrasound guidance.  An 018 wire was advanced without resistance and a micropuncture sheath was placed.  The 018 wire was removed and a benson wire was placed.  The micropuncture sheath was exchanged for a 5 french sheath.  An omniflush catheter was advanced over the wire to the level of L-1.  An abdominal angiogram was obtained.  Next, using the omniflush catheter and a benson wire, the aortic bifurcation was crossed and the catheter was placed into theright external iliac artery and right runoff was obtained.  Findings:   Aortogram: No  significant renal artery stenosis was visualized.  The infrarenal abdominal aorta is widely patent.  Bilateral common and external iliac arteries are widely patent.  Bilateral common femoral arteries are widely patent.  Left Lower Extremity: The left common femoral profundofemoral artery widely patent.  The superficial femoral artery is diffusely diseased with a occlusion in the adductor canal into the above-knee popliteal artery.  There is three-vessel runoff.  Intervention: After the above images were acquired the decision was made to proceed with intervention.  A 6 x 45 cm sheath was advanced into the right superficial femoral artery.  Selective injections with the catheter in the superficial femoral artery were performed to confirm the occlusion.  Next using an 035 Glidewire and a quick cross catheter the occlusion was easily crossed.  A selective injection with the catheter was passed beyond the lesion and the popliteal artery was performed.  A 035 versa core wire was then placed.  Because of how easily the wire went through the lesion and because there was concern for a subacute process, I elected to perform mechanical thrombectomy to potentially remove any potential embolic debris.  A penumbra CAT 6 device was advanced across the lesion.  3 passes down the back were made.  We did evacuate some thrombus in the canister.  I then primarily stented the superficial femoral-popliteal artery placing two 6 x 150 overlapping Eluvia stents that were postdilated with a 5 mm balloon.  Completion imaging showed widely patent superficial femoral artery with no change in runoff.  The long sheath was exchanged out for a short 6 French  sheath and the groin was closed with a Mynx.  Impression:  #1  Right superficial femoral/popliteal artery occlusion, successfully crossed and treated using overlapping 6 mm Eluvia drug eluding stents.  Mechanical thrombectomy of the occlusion was performed prior to stenting  #2   Three-vessel runoff to the right leg  #3  No significant aortoiliac occlusive disease   V. Malvina New, M.D., [facility] [place] [employer] Office: [phone] Pager:  [phone]

## 2024-04-14 NOTE — Plan of Care (Signed)
  Problem: Clinical Measurements: Goal: Will remain free from infection Outcome: Not Progressing   Problem: Pain Managment: Goal: General experience of comfort will improve and/or be controlled Outcome: Not Progressing   Problem: Safety: Goal: Ability to remain free from injury will improve Outcome: Not Progressing   Problem: Skin Integrity: Goal: Risk for impaired skin integrity will decrease Outcome: Not Progressing

## 2024-04-15 ENCOUNTER — Telehealth: Payer: Self-pay | Admitting: *Deleted

## 2024-04-15 DIAGNOSIS — Z95828 Presence of other vascular implants and grafts: Secondary | ICD-10-CM | POA: Diagnosis not present

## 2024-04-15 DIAGNOSIS — Z9889 Other specified postprocedural states: Secondary | ICD-10-CM

## 2024-04-15 DIAGNOSIS — I96 Gangrene, not elsewhere classified: Secondary | ICD-10-CM | POA: Diagnosis not present

## 2024-04-15 LAB — BASIC METABOLIC PANEL WITH GFR
Anion gap: 11 (ref 5–15)
BUN: 12 mg/dL (ref 8–23)
CO2: 22 mmol/L (ref 22–32)
Calcium: 8.2 mg/dL — ABNORMAL LOW (ref 8.9–10.3)
Chloride: 107 mmol/L (ref 98–111)
Creatinine, Ser: 1.46 mg/dL — ABNORMAL HIGH (ref 0.44–1.00)
GFR, Estimated: 36 mL/min — ABNORMAL LOW (ref >60)
Glucose, Bld: 93 mg/dL (ref 70–99)
Potassium: 5 mmol/L (ref 3.5–5.1)
Sodium: 140 mmol/L (ref 135–145)

## 2024-04-15 LAB — CBC
HCT: 31.1 % — ABNORMAL LOW (ref 36.0–46.0)
Hemoglobin: 10 g/dL — ABNORMAL LOW (ref 12.0–15.0)
MCH: 30 pg (ref 26.0–34.0)
MCHC: 32.2 g/dL (ref 30.0–36.0)
MCV: 93.4 fL (ref 80.0–100.0)
Platelets: 171 K/uL (ref 150–400)
RBC: 3.33 MIL/uL — ABNORMAL LOW (ref 3.87–5.11)
RDW: 13 % (ref 11.5–15.5)
WBC: 6.8 K/uL (ref 4.0–10.5)
nRBC: 0 % (ref 0.0–0.2)

## 2024-04-15 LAB — LIPID PANEL
Cholesterol: 139 mg/dL (ref 0–200)
HDL: 33 mg/dL — ABNORMAL LOW (ref >40)
LDL Cholesterol: 96 mg/dL (ref 0–99)
Total CHOL/HDL Ratio: 4.2 ratio
Triglycerides: 52 mg/dL (ref <150)
VLDL: 10 mg/dL (ref 0–40)

## 2024-04-15 MED ORDER — ENOXAPARIN SODIUM 30 MG/0.3ML IJ SOSY
30.0000 mg | PREFILLED_SYRINGE | INTRAMUSCULAR | Status: DC
  Administered 2024-04-15: 30 mg via SUBCUTANEOUS
  Filled 2024-04-15: qty 0.3

## 2024-04-15 MED ORDER — SODIUM CHLORIDE 0.9 % IV SOLN: INTRAVENOUS | Status: DC

## 2024-04-15 NOTE — Progress Notes (Signed)
  Progress Note    04/15/2024 8:15 AM 1 Day Post-Op  Subjective:  no complaints   Vitals:   04/15/24 0559 04/15/24 0800  BP: (!) 99/48 (!) 155/64  Pulse: 87 94  Resp: 16   Temp: 98.5 F (36.9 C)   SpO2: 99% 99%   Physical Exam: Lungs:  non labored Incisions:  L groin without hematoma Extremities:  palpable R DP pulse Neurologic: A&O  CBC    Component Value Date/Time   WBC 6.8 04/15/2024 0435   RBC 3.33 (L) 04/15/2024 0435   HGB 10.0 (L) 04/15/2024 0435   HGB 12.9 03/17/2024 0842   HCT 31.1 (L) 04/15/2024 0435   HCT 41.0 03/17/2024 0842   PLT 171 04/15/2024 0435   PLT 306 03/17/2024 0842   MCV 93.4 04/15/2024 0435   MCV 97 03/17/2024 0842   MCH 30.0 04/15/2024 0435   MCHC 32.2 04/15/2024 0435   RDW 13.0 04/15/2024 0435   RDW 12.2 03/17/2024 0842   LYMPHSABS 1.4 03/17/2024 0842   MONOABS 0.2 03/25/2022 0818   EOSABS 0.2 03/17/2024 0842   BASOSABS 0.0 03/17/2024 0842    BMET    Component Value Date/Time   NA 140 04/15/2024 0435   NA 146 (H) 03/17/2024 0842   K 5.0 04/15/2024 0435   CL 107 04/15/2024 0435   CO2 22 04/15/2024 0435   GLUCOSE 93 04/15/2024 0435   BUN 12 04/15/2024 0435   BUN 8 03/17/2024 0842   CREATININE 1.46 (H) 04/15/2024 0435   CALCIUM  8.2 (L) 04/15/2024 0435   GFRNONAA 36 (L) 04/15/2024 0435   GFRAA 53 (L) 01/23/2020 0700    INR    Component Value Date/Time   INR 0.9 03/08/2024 0910     Intake/Output Summary (Last 24 hours) at 04/15/2024 0815 Last data filed at 04/14/2024 2144 Gross per 24 hour  Intake 106.69 ml  Output 250 ml  Net -143.31 ml     Assessment/Plan:  83 y.o. female is s/p aortogram with mechanical thrombectomy of R femoropopliteal with SFA stenting 1 Day Post-Op   BLE well perfused on exam this morning L groin cath site without hematoma Continue aspirin , plavix , statin daily Office will arrange duplex in 4-6 weeks.   If unable to tolerate R fifth toe pain, she will require amputation.  This however can  be arranged in the outpatient setting.   Donnice Sender, PA-C [facility] [phone] 04/15/2024 8:15 AM  VASCULAR STAFF ADDENDUM: I agree with the above.  The lower extremity wound to be followed in the outpatient setting by podiatry.  No need for inpatient amputation.  No drainage appreciated.  Patient has been seen by a podiatrist closer to her home in [place], and plans to return.  Should she have any issues, she was asked to call our office immediately.  Fonda FORBES Rim MD [facility] Phone Number: [phone]

## 2024-04-15 NOTE — Progress Notes (Signed)
 PROGRESS NOTE  [name]  DOB: [birth year]  PCP: Zollie Lowers, MD FMW:[id number]  DOA: 04/13/2024  LOS: 2 days  Hospital Day: 3  Subjective: Patient was seen and examined this a.m. Pleasant elderly African-American female.  Lying in bed.  Not in distress.  No new symptoms.  Family not at bedside. Afebrile, heart rate in 90s, blood pressure mostly elevated 150s, Labs this morning with creatinine up from 1.06 yesterday to 1.46 today, WBC count normal  Brief narrative: [name] is a 83 y.o. female with PMH significant for HTN, HLD, CAD, CHF, h/o stroke with slight right-sided weakness, gout. COPD, renal artery stenosis, former smoker quit 2 years ago. 11/17, patient presented from home to ED at [facility] with complaint bilateral foot pain and swelling progressively worsening for last 2 weeks. 11/2, she was seen in outside ED at [facility], then follow-up with PCP, had ABIs done which showed severe RLE arterial disease and was hence referred to vascular surgery. Symptoms continue to worsen and has presented to ED on 11/17  In the ED, hemodynamically stable CBC and BMP unremarkable Chest x-ray unremarkable CT angio aorta -bifemoral showed 1. Complete occlusion of the proximal to mid right superficial femoral artery with distal reconstitution and diminished caliber to 3 mm, noting limited evaluation due to partial collimation off-view. 2. Complete occlusion of the origin of the right popliteal artery (~6 cm length) with distal collateral reconstitution. 3. Complete occlusion of the right mid peroneal artery with diminished flow to the right foot via the anterior and posterior tibial arteries. 4. Severe atherosclerotic disease of the abdominal aorta, iliac, and femoral arteries. 5. Left superficial femoral artery stenosis with luminal caliber narrowed to 3 mm without complete occlusion. 3-vessel runoff to the left foot and ankle.  Vascular surgery consulted, recommended transfer to  [facility]. Admitted to [facility]  Assessment and plan: RLE critical ischemia Right fifth toe ischemic changes S/p aortobifemoral angiogram, right superficial femoral and popliteal artery stenting and mechanical thrombectomy -11/18 with Dr. Serene Symptoms progressively worsening for last few weeks now with blue toe syndrome Seen by vascular surgery Imagings and procedure as above Seen by vascular surgery PTA meds- Plavix  75 mg Currently continued on aspirin , Plavix , statin Per vascular surgery, if patient is unable to tolerate right fifth toe pain, she will require amputation as an outpatient.  AKI Creatinine normal at baseline. Post-angiogram, creatinine elevated 1.46 today. Star NS at 75 mL/h. Repeat labs tomorrow Recent Labs    12/17/23 1425 03/08/24 0910 03/09/24 0343 03/10/24 0802 03/11/24 0416 03/12/24 0444 03/17/24 0842 04/13/24 1246 04/14/24 0447 04/15/24 0435  BUN 20 18 18 11  7* 7* 8 12 8 12   CREATININE 1.16* 1.30* 1.13* 0.94 0.82 0.96 1.32* 1.11* 1.06* 1.46*  CO2 18* 28 28 29 24 27 24 25 23 22    Chronic diastolic CHF Hypertension Heart failure remains compensated.  Blood pressure stable Last echo 06/2023 EF of 55 to 60%, grade 1 DD.  PTA meds- clonidine  0.3 mg twice daily amlodipine  10 mg daily, Imdur  60 mg daily, Lasix  20 mg daily, olmesartan  40 mg daily.  Unclear if patient was taking all these medications. Currently continued on amlodipine  10 mg daily, Imdur  60 mg daily IV hydralazine  as needed  HLD Lipitor  80 mg daily  COPD/tobacco abuse  lung exam stable. Continue bronchodilators   Restless legs Continue ropinirole  1 mg nightly  Neuropathy Lyrica  300 mg nightly  Home med list has not been obtained/updated by [facility] at this time.  Please  double check admission reconciliation.     Mobility: Uses a cane at home.  Needs PT eval  Goals of care   Code Status: Full Code     DVT prophylaxis:  enoxaparin  (LOVENOX ) injection 30 mg Start:  04/15/24 1415   Antimicrobials: None Fluid: None Consultants: Vascular surgery Family Communication: None at bedside  Status: Inpatient Level of care:  Telemetry   Patient is from: Home Needs to continue in-hospital care: POD 1, creatinine rising up Anticipated d/c to: Hopefully home in 1 to 2 days if creatinine improves      Diet:  Diet Order             Diet regular Room service appropriate? Yes; Fluid consistency: Thin  Diet effective now                   Scheduled Meds:  amLODipine   10 mg Oral Daily   aspirin  EC  81 mg Oral Daily   atorvastatin   80 mg Oral Daily   clopidogrel   75 mg Oral Q breakfast   enoxaparin  (LOVENOX ) injection  30 mg Subcutaneous Q24H   isosorbide  mononitrate  60 mg Oral Daily   rOPINIRole   1 mg Oral QHS   sodium chloride  flush  3 mL Intravenous Q12H    PRN meds: sodium chloride , acetaminophen  **OR** acetaminophen , fentaNYL  (SUBLIMAZE ) injection, hydrALAZINE , labetalol , methocarbamol, ondansetron  **OR** ondansetron  (ZOFRAN ) IV, oxyCODONE , polyethylene glycol, sodium chloride  flush   Infusions:   sodium chloride      sodium chloride  75 mL/hr at 04/15/24 1312    Antimicrobials: Anti-infectives (From admission, onward)    None       Objective: Vitals:   04/15/24 0800 04/15/24 1141  BP: (!) 155/64 (!) 144/57  Pulse: 94   Resp:  16  Temp:  98 F (36.7 C)  SpO2: 99%     Intake/Output Summary (Last 24 hours) at 04/15/2024 1330 Last data filed at 04/14/2024 2144 Gross per 24 hour  Intake 106.69 ml  Output 250 ml  Net -143.31 ml   Filed Weights   04/13/24 1134  Weight: 50.8 kg   Weight change:  Body mass index is 21.16 kg/m.   Physical Exam: General exam: Pleasant, elderly African-American female.  Not in distress Skin: No rashes, lesions or ulcers. HEENT: Atraumatic, normocephalic, no obvious bleeding Lungs: Clear to auscultation bilaterally,  CVS: S1, S2, no murmur,    GI/Abd: Soft, nontender, nondistended,  bowel sound present,   CNS: alert, awake, oriented x 3 Psychiatry: Mood appropriate Extremities: No pedal edema, no calf tenderness,   Data Review: I have personally reviewed the laboratory data and studies available.  F/u labs  Unresulted Labs (From admission, onward)     Start     Ordered   04/14/24 0500  CBC  Daily,   R      04/13/24 2047   Unscheduled  Basic metabolic panel with GFR  Daily,   R     Question:  Specimen collection method  Answer:  Lab=Lab collect   04/15/24 1330            Signed, Chapman Rota, MD [facility] 04/15/2024

## 2024-04-15 NOTE — Evaluation (Signed)
 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 S/p aortogram with stent placement of R superficial femoral artery and thrombectomy of R superficial femoral-popliteal artery 11/18. PMH includes: COPD, HTN, CVA, renal artery stenosis, CHF, NSTEMI, tobacco use, and gout.   Clinical Impression  Pt in bed upon arrival of PT, agreeable to evaluation at this time. Prior to admission the pt was ambulating with use of cane in her home and RW for community ambulation, reports daily walks of 15-20 min. The pt is independent with all ADLs and IADLs, even helps her spouse with his ADLs at baseline. She was able to complete all bed mobility, sit-stand transfers, and lower body dressing without assistance or use of DME. However, for pain management at this time, pt prefers BUE support on RW for gait. She was able to complete ~250 ft ambulation without need for assist, but demos slowed speed and dependence on BUE support. Recommend continued skilled PT to wean back to use of cane and improve endurance so pt can return to taking care of her spouse and IADLs without issue. Will continue to follow acutely, but can return home with intermittent support once medically cleared.      If plan is discharge home, recommend the following: A little help with walking and/or transfers;Assistance with cooking/housework;Help with stairs or ramp for entrance   Can travel by private vehicle        Equipment Recommendations None recommended by PT  Recommendations for Other Services       Functional Status Assessment Patient has had a recent decline in their functional status and demonstrates the ability to make significant improvements in function in a reasonable and predictable amount of time.      Precautions / Restrictions Precautions Precautions: Fall Recall of Precautions/Restrictions: Intact Restrictions Weight Bearing Restrictions Per Provider Order: No      Mobility  Bed Mobility Overal bed mobility: Modified Independent             General bed mobility comments: used bed rails, no assist    Transfers Overall transfer level: Needs assistance Equipment used: None Transfers: Sit to/from Stand Sit to Stand: Contact guard assist           General transfer comment: completed without UE support on RW x2 from EOB, can static stand without UE support with good balance but UE support due to pain in feet/legs.    Ambulation/Gait Ambulation/Gait assistance: Contact guard assist Gait Distance (Feet): 250 Feet Assistive device: Rolling walker (2 wheels) Gait Pattern/deviations: Step-through pattern, Decreased step length - left, Decreased dorsiflexion - left, Shuffle, Trunk flexed, Narrow base of support Gait velocity: decreased Gait velocity interpretation: <1.31 ft/sec, indicative of household ambulator   General Gait Details: pt with small steps and L foot with minimal clearance. VSS on RA. no overt LOB but dependent on BUE support      Balance Overall balance assessment: Needs assistance, History of Falls Sitting-balance support: No upper extremity supported, Feet supported Sitting balance-Leahy Scale: Good     Standing balance support: Bilateral upper extremity supported, During functional activity Standing balance-Leahy Scale: Fair Standing balance comment: can static stand with single UE support, BUE support for gait  Pertinent Vitals/Pain Pain Assessment Pain Assessment: 0-10 Pain Score: 7  Pain Location: R foot Pain Descriptors / Indicators: Discomfort, Grimacing, Pins and needles Pain Intervention(s): Limited activity within patient's tolerance, Monitored during session, Repositioned    Home Living  Family/patient expects to be discharged to:: Private residence Living Arrangements: Spouse/significant other Available Help at Discharge: Family;Available 24 hours/day Type of Home: House Home Access: Stairs to enter Entrance Stairs-Rails: None Entrance Stairs-Number of Steps: 1   Home Layout: One level Home Equipment: Agricultural Consultant (2 wheels);Cane - quad;BSC/3in1      Prior Function Prior Level of Function : Independent/Modified Independent;History of Falls (last six months)             Mobility Comments: reports frequent falls in last 1-2 months, walks 15-20 min at a time each day, cane in house and RW in community ADLs Comments: pt reports independence, helps her spouse with his ADLs     Extremity/Trunk Assessment   Upper Extremity Assessment Upper Extremity Assessment: Overall WFL for tasks assessed    Lower Extremity Assessment Lower Extremity Assessment: RLE deficits/detail RLE Deficits / Details: limited by pain, reports pins and needles on bottom of foot. grossly 4/5 to MMT RLE: Unable to fully assess due to pain RLE Sensation: decreased light touch RLE Coordination: WNL    Cervical / Trunk Assessment Cervical / Trunk Assessment: Normal  Communication   Communication Communication: No apparent difficulties    Cognition Arousal: Alert Behavior During Therapy: WFL for tasks assessed/performed   PT - Cognitive impairments: No apparent impairments                       PT - Cognition Comments: oriented and WFL conversationally, not formally assessed Following commands: Intact       Cueing Cueing Techniques: Verbal cues     General Comments General comments (skin integrity, edema, etc.): VSS on RA    Exercises     Assessment/Plan    PT Assessment Patient needs continued PT services  PT Problem List Decreased strength;Decreased activity tolerance;Decreased balance;Decreased mobility       PT Treatment Interventions DME instruction;Gait  training;Stair training;Functional mobility training;Therapeutic exercise;Balance training;Therapeutic activities;Patient/family education    PT Goals (Current goals can be found in the Care Plan section)  Acute Rehab PT Goals Patient Stated Goal: to return home PT Goal Formulation: With patient Time For Goal Achievement: 04/29/24 Potential to Achieve Goals: Good    Frequency Min 2X/week        AM-PAC PT 6 Clicks Mobility  Outcome Measure Help needed turning from your back to your side while in a flat bed without using bedrails?: None Help needed moving from lying on your back to sitting on the side of a flat bed without using bedrails?: None Help needed moving to and from a bed to a chair (including a wheelchair)?: A Little Help needed standing up from a chair using your arms (e.g., wheelchair or bedside chair)?: A Little Help needed to walk in hospital room?: A Little Help needed climbing 3-5 steps with a railing? : A Little 6 Click Score: 20    End of Session Equipment Utilized During Treatment: Gait belt Activity Tolerance: Patient tolerated treatment well Patient left: with call bell/phone within reach;with chair alarm set Nurse Communication: Mobility status PT Visit Diagnosis: Unsteadiness on feet (R26.81);Muscle weakness (generalized) (M62.81)    Time: 9146-9077 PT Time Calculation (min) (ACUTE ONLY): 29 min   Charges:   PT Evaluation $PT Eval Low Complexity:  1 Low PT Treatments $Therapeutic Exercise: 8-22 mins PT General Charges $$ ACUTE PT VISIT: 1 Visit         Izetta Call, PT, DPT   Acute Rehabilitation Department Office [phone] Secure Chat Communication Preferred  Izetta JULIANNA Call 04/15/2024, 11:35 AM

## 2024-04-15 NOTE — Discharge Instructions (Signed)

## 2024-04-16 ENCOUNTER — Encounter (HOSPITAL_COMMUNITY): Payer: Self-pay | Admitting: Surgery

## 2024-04-16 ENCOUNTER — Encounter: Payer: Self-pay | Admitting: Family

## 2024-04-16 ENCOUNTER — Other Ambulatory Visit (HOSPITAL_COMMUNITY): Payer: Self-pay

## 2024-04-16 DIAGNOSIS — I96 Gangrene, not elsewhere classified: Secondary | ICD-10-CM | POA: Diagnosis not present

## 2024-04-16 LAB — BASIC METABOLIC PANEL WITH GFR
Anion gap: 11 (ref 5–15)
BUN: 10 mg/dL (ref 8–23)
CO2: 22 mmol/L (ref 22–32)
Calcium: 8 mg/dL — ABNORMAL LOW (ref 8.9–10.3)
Chloride: 107 mmol/L (ref 98–111)
Creatinine, Ser: 1.15 mg/dL — ABNORMAL HIGH (ref 0.44–1.00)
GFR, Estimated: 48 mL/min — ABNORMAL LOW (ref >60)
Glucose, Bld: 90 mg/dL (ref 70–99)
Potassium: 4 mmol/L (ref 3.5–5.1)
Sodium: 140 mmol/L (ref 135–145)

## 2024-04-16 LAB — CBC
HCT: 28.7 % — ABNORMAL LOW (ref 36.0–46.0)
Hemoglobin: 9.3 g/dL — ABNORMAL LOW (ref 12.0–15.0)
MCH: 29.5 pg (ref 26.0–34.0)
MCHC: 32.4 g/dL (ref 30.0–36.0)
MCV: 91.1 fL (ref 80.0–100.0)
Platelets: 170 K/uL (ref 150–400)
RBC: 3.15 MIL/uL — ABNORMAL LOW (ref 3.87–5.11)
RDW: 13 % (ref 11.5–15.5)
WBC: 6 K/uL (ref 4.0–10.5)
nRBC: 0 % (ref 0.0–0.2)

## 2024-04-16 MED ORDER — ISOSORBIDE MONONITRATE ER 60 MG PO TB24
60.0000 mg | ORAL_TABLET | Freq: Every day | ORAL | 0 refills | Status: DC
  Filled 2024-04-16: qty 90, 90d supply, fill #0

## 2024-04-16 MED ORDER — ASPIRIN 81 MG PO TBEC
81.0000 mg | DELAYED_RELEASE_TABLET | Freq: Every day | ORAL | 12 refills | Status: AC
  Filled 2024-04-16: qty 30, 30d supply, fill #0

## 2024-04-16 MED ORDER — AMLODIPINE BESYLATE 10 MG PO TABS
10.0000 mg | ORAL_TABLET | Freq: Every day | ORAL | 0 refills | Status: DC
  Filled 2024-04-16: qty 90, 90d supply, fill #0

## 2024-04-16 MED ORDER — ATORVASTATIN CALCIUM 80 MG PO TABS
80.0000 mg | ORAL_TABLET | Freq: Every day | ORAL | 0 refills | Status: DC
  Filled 2024-04-16: qty 90, 90d supply, fill #0

## 2024-04-16 NOTE — Plan of Care (Signed)

## 2024-04-16 NOTE — Plan of Care (Signed)
  Problem: Activity: Goal: Risk for activity intolerance will decrease Outcome: Progressing   Problem: Coping: Goal: Level of anxiety will decrease Outcome: Progressing   Problem: Pain Managment: Goal: General experience of comfort will improve and/or be controlled Outcome: Progressing   Problem: Safety: Goal: Ability to remain free from injury will improve Outcome: Progressing   Problem: Skin Integrity: Goal: Risk for impaired skin integrity will decrease Outcome: Progressing   Problem: Education: Goal: Understanding of CV disease, CV risk reduction, and recovery process will improve Outcome: Progressing   Problem: Cardiovascular: Goal: Ability to achieve and maintain adequate cardiovascular perfusion will improve Outcome: Progressing

## 2024-04-16 NOTE — Progress Notes (Signed)
 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 S/p aortogram with stent placement of R superficial femoral artery and thrombectomy of R superficial femoral-popliteal artery 11/18. PMH includes: COPD, HTN, CVA, renal artery stenosis, CHF, NSTEMI, tobacco use, and gout.    PT Comments  Patient eager to work with therapies. Offered option of RW vs cane and pt currently prefers RW. Min cues for improved gait dynamics with RW with pt tolerating 200 ft. Goals met. Continue to recommend HHPT as pt hopes to return to using cane. Discharge from acute PT.     If plan is discharge home, recommend the following: A little help with walking and/or transfers;Assistance with cooking/housework;Help with stairs or ramp for entrance   Can travel by private vehicle        Equipment Recommendations  None recommended by PT    Recommendations for Other Services       Precautions / Restrictions Precautions Precautions: Fall Recall of Precautions/Restrictions: Intact Restrictions Weight Bearing Restrictions Per Provider Order: No     Mobility  Bed Mobility Overal bed mobility: Modified Independent             General bed mobility comments: HOB elevated, no rail    Transfers Overall transfer level: Modified independent Equipment used: Rolling walker (2 wheels) Transfers: Sit to/from Stand Sit to Stand: Modified independent (Device/Increase time)           General transfer comment: proper hand placement    Ambulation/Gait Ambulation/Gait assistance: Contact guard assist Gait Distance (Feet): 200 Feet Assistive device: Rolling walker (2 wheels) Gait Pattern/deviations: Step-through pattern, Decreased step length - left, Decreased dorsiflexion - left,  Shuffle, Trunk flexed, Decreased dorsiflexion - right Gait velocity: decreased     General Gait Details: initially shuffling both feet with little to no foot clearance; vc able to complete heelstrike with each foot and maintain without further cuing   Stairs             Wheelchair Mobility     Tilt Bed    Modified Rankin (Stroke Patients Only)       Balance Overall balance assessment: Needs assistance, History of Falls Sitting-balance support: No upper extremity supported, Feet supported Sitting balance-Leahy Scale: Good     Standing balance support: Bilateral upper extremity supported, During functional activity Standing balance-Leahy Scale: Fair Standing balance comment: can static stand with single UE support, BUE support for gait                            Communication Communication Communication: No apparent difficulties  Cognition Arousal: Alert Behavior During Therapy: WFL for tasks assessed/performed   PT - Cognitive impairments: No apparent impairments                       PT - Cognition Comments: oriented and WFL conversationally, not formally assessed Following commands: Intact      Cueing Cueing Techniques: Verbal cues  Exercises      General Comments General comments (skin integrity, edema, etc.): VSS on RA      Pertinent Vitals/Pain Pain Assessment Pain Assessment: 0-10 Pain Score: 6  Pain Location: R foot Pain Descriptors / Indicators: Discomfort, Grimacing, Pins and needles Pain Intervention(s): Limited activity  within patient's tolerance, Monitored during session    Home Living                          Prior Function            PT Goals (current goals can now be found in the care plan section) Acute Rehab PT Goals Patient Stated Goal: to return home Time For Goal Achievement: 04/29/24 Potential to Achieve Goals: Good Progress towards PT goals: Progressing toward goals;Goals met/education  completed, patient discharged from PT    Frequency    Min 2X/week      PT Plan      Co-evaluation              AM-PAC PT 6 Clicks Mobility   Outcome Measure  Help needed turning from your back to your side while in a flat bed without using bedrails?: None Help needed moving from lying on your back to sitting on the side of a flat bed without using bedrails?: None Help needed moving to and from a bed to a chair (including a wheelchair)?: None Help needed standing up from a chair using your arms (e.g., wheelchair or bedside chair)?: None Help needed to walk in hospital room?: A Little Help needed climbing 3-5 steps with a railing? : A Little 6 Click Score: 22    End of Session   Activity Tolerance: Patient tolerated treatment well Patient left: with call bell/phone within reach;in bed Nurse Communication: Mobility status PT Visit Diagnosis: Unsteadiness on feet (R26.81);Muscle weakness (generalized) (M62.81)   PT Discharge Note  Patient is being discharged from PT services secondary to:  Goals met and no further therapy needs identified.  Please see latest Therapy Progress Note for current level of functioning and progress toward goals.  Progress and discharge plan and discussed with patient/caregiver and they  Agree    Time: 9150-9092 PT Time Calculation (min) (ACUTE ONLY): 18 min  Charges:    $Gait Training: 8-22 mins PT General Charges $$ ACUTE PT VISIT: 1 Visit                      Tabitha RAMAN, PT Acute Rehabilitation Services  Office [phone]    [name] 04/16/2024, 9:14 AM

## 2024-04-16 NOTE — Discharge Summary (Addendum)
 Physician Discharge Summary  [name] FMW:[id number] DOB: [birth year] DOA: 04/13/2024  PCP: Zollie Lowers, MD  Admit date: 04/13/2024 Discharge date: 04/16/2024  Admitted from: Home Discharge disposition: Home with home health  Recommendations at discharge:  Continue aspirin , Plavix  and statin Follow-up with vascular surgery as an outpatient. Your blood pressure is currently controlled with amlodipine  10 mg daily and Imdur  60 mg daily.  Continue to hold other medicines.  Monitor blood pressure at home and resume one at a time if needed under the supervision of a primary care provider   Subjective: Patient was seen and examined this a.m. Pleasant elderly African-American female.  Lying on bed.  Not in distress. No family at bedside. Afebrile, hemodynamically stable Labs this morning with creatinine better at 1.15   Brief narrative: [name] is a 83 y.o. female with PMH significant for HTN, HLD, CAD, CHF, h/o stroke with slight right-sided weakness, gout. COPD, renal artery stenosis, former smoker quit 2 years ago. 11/17, patient presented from home to ED at [facility] with complaint bilateral foot pain and swelling progressively worsening for last 2 weeks. 11/2, she was seen in outside ED at [facility], then follow-up with PCP, had ABIs done which showed severe RLE arterial disease and was hence referred to vascular surgery. Symptoms continue to worsen and has presented to ED on 11/17  In the ED, hemodynamically stable CBC and BMP unremarkable Chest x-ray unremarkable CT angio aorta -bifemoral showed 1. Complete occlusion of the proximal to mid right superficial femoral artery with distal reconstitution and diminished caliber to 3 mm, noting limited evaluation due to partial collimation off-view. 2. Complete occlusion of the origin of the right popliteal artery (~6 cm length) with distal collateral reconstitution. 3. Complete occlusion of the right mid peroneal artery  with diminished flow to the right foot via the anterior and posterior tibial arteries. 4. Severe atherosclerotic disease of the abdominal aorta, iliac, and femoral arteries. 5. Left superficial femoral artery stenosis with luminal caliber narrowed to 3 mm without complete occlusion. 3-vessel runoff to the left foot and ankle.  Vascular surgery consulted, recommended transfer to [facility]. Admitted to [facility] course: RLE critical ischemia Right fifth toe ischemic changes S/p aortobifemoral angiogram, right superficial femoral and popliteal artery stenting and mechanical thrombectomy -11/18 with Dr. Serene Symptoms progressively worsening for last few weeks now with blue toe syndrome Seen by vascular surgery Imagings and procedure as above Seen by vascular surgery Currently on aspirin , Plavix , statin.  Prescriptions sent to pharmacy Per vascular surgery, if patient is unable to tolerate right fifth toe pain, she will require amputation as an outpatient. Vascular surgery to arrange duplex in 4 to 6 weeks.  AKI Creatinine normal at baseline. Post-angiogram, creatinine elevated to 1.46 . With IV hydration, creatinine improving, at 1.15 today. Recent Labs    03/08/24 0910 03/09/24 0343 03/10/24 0802 03/11/24 0416 03/12/24 0444 03/17/24 0842 04/13/24 1246 04/14/24 0447 04/15/24 0435 04/16/24 0359  BUN 18 18 11  7* 7* 8 12 8 12 10   CREATININE 1.30* 1.13* 0.94 0.82 0.96 1.32* 1.11* 1.06* 1.46* 1.15*  CO2 28 28 29 24 27 24 25 23 22  22   Chronic diastolic CHF Hypertension Heart failure remains compensated.  Blood pressure stable Last echo 06/2023 EF of 55 to 60%, grade 1 DD.  PTA meds- clonidine  0.3 mg twice daily amlodipine  10 mg daily, Imdur  60 mg daily, Lasix  20 mg daily, olmesartan  40 mg daily.  Unclear if patient was taking all  these medications. Currently only continued on amlodipine  10 mg daily, Imdur  60 mg daily and her blood pressure level has been in normal range.   Clonidine , and olmesartan  have been stopped.  Okay to resume Lasix  as needed for fluid.  Continue to monitor blood pressure at home and resume one at a time if needed under the supervision of a primary care provider  HLD Lipitor  80 mg daily  COPD/tobacco abuse  lung exam stable. Continue bronchodilators   Restless legs Continue ropinirole  1 mg nightly  Neuropathy Lyrica  300 mg nightly  Impaired mobility Seen by PT Home with PT recommended  Goals of care   Code Status: Full Code   Diet:  Diet Order             Diet general           Diet regular Room service appropriate? Yes; Fluid consistency: Thin  Diet effective now                   Nutritional status:  Body mass index is 21.16 kg/m.       Wounds:  - Wound 04/13/24 1818 Other (Comment) Toe (Comment  which one) Anterior;Right (Active)  Date First Assessed/Time First Assessed: 04/13/24 1818   Present on Original Admission: Yes  Primary Wound Type: (c) Other (Comment)  Location: Toe (Comment  which one)  Location Orientation: Anterior;Right    Assessments 04/13/2024  6:18 PM 04/16/2024  8:00 AM  Site / Wound Assessment Dry;Black Dry;Black  Peri-wound Assessment -- Intact  Drainage Amount None --  Treatments Other (Comment) --  Dressing Type None None  Dressing Status None None     No associated orders.    Discharge Medications:   Allergies as of 04/16/2024       Reactions   Penicillins Anaphylaxis, Swelling, Rash, Other (See Comments)   Immediate rash, facial/tongue/throat swelling, SOB or lightheadedness with hypotension        Medication List     STOP taking these medications    amLODipine -olmesartan  10-40 MG tablet Commonly known as: AZOR    cloNIDine  0.2 MG tablet Commonly known as: CATAPRES        TAKE these medications    acetaminophen  500 MG tablet Commonly known as: TYLENOL  Take 2 tablets (1,000 mg total) by mouth 3 (three) times daily. What changed:  when to take  this reasons to take this   albuterol  108 (90 Base) MCG/ACT inhaler Commonly known as: VENTOLIN  HFA Inhale 2 puffs into the lungs every 4 (four) hours as needed for wheezing or shortness of breath.   alendronate  70 MG tablet Commonly known as: FOSAMAX  Take 1 tablet (70 mg total) by mouth every 7 (seven) days. Take with a full glass of water  on an empty stomach. What changed: additional instructions   amLODipine  10 MG tablet Commonly known as: NORVASC  Take 1 tablet (10 mg total) by mouth daily. Start taking on: April 17, 2024   aspirin  EC 81 MG tablet Take 1 tablet (81 mg total) by mouth daily. Swallow whole. Start taking on: April 17, 2024   atorvastatin  80 MG tablet Commonly known as: LIPITOR  Take 1 tablet (80 mg total) by mouth daily.   clopidogrel  75 MG tablet Commonly known as: PLAVIX  Take 1 tablet (75 mg total) by mouth daily.   fluticasone  furoate-vilanterol 200-25 MCG/ACT Aepb Commonly known as: Breo Ellipta  Inhale 1 puff into the lungs daily.   furosemide  20 MG tablet Commonly known as: LASIX  TAKE 1 TABLET (20 MG TOTAL)  BY MOUTH DAILY. FOR SWELLING What changed:  when to take this reasons to take this   isosorbide  mononitrate 60 MG 24 hr tablet Commonly known as: IMDUR  Take 1 tablet (60 mg total) by mouth daily.   oxyCODONE  10 mg 12 hr tablet Commonly known as: OXYCONTIN  Take 10 mg by mouth every 4 (four) hours as needed.   pantoprazole  40 MG tablet Commonly known as: PROTONIX  Take 1 tablet (40 mg total) by mouth daily. What changed:  when to take this reasons to take this   potassium chloride  SA 20 MEQ tablet Commonly known as: KLOR-CON  M Take 1 tablet (20 mEq total) by mouth daily. For potassium replacement/ supplement What changed:  when to take this reasons to take this   pregabalin  300 MG capsule Commonly known as: Lyrica  Take 1 capsule (300 mg total) by mouth at bedtime. To reduce back pain   rOPINIRole  1 MG tablet Commonly known  as: REQUIP  TAKE 1 TABLET (1 MG TOTAL) BY MOUTH AT BEDTIME. FOR LEG CRAMPS   tizanidine  2 MG capsule Commonly known as: ZANAFLEX  Take 2 mg by mouth at bedtime.               Discharge Care Instructions  (From admission, onward)           Start     Ordered   04/16/24 0000  Discharge wound care:        04/16/24 1127             Follow ups:    Follow-up Information     [facility] at [facility] Follow up in 5 week(s).   Specialty: Vascular Surgery Contact information: [address] [place]  [address] [phone]        Zollie Lowers, MD Follow up.   Specialty: Family Medicine Contact information: [address] [place] [address] [phone]                 Discharge Instructions:   Discharge Instructions     Call MD for:  difficulty breathing, headache or visual disturbances   Complete by: As directed    Call MD for:  extreme fatigue   Complete by: As directed    Call MD for:  hives   Complete by: As directed    Call MD for:  persistant dizziness or light-headedness   Complete by: As directed    Call MD for:  persistant nausea and vomiting   Complete by: As directed    Call MD for:  severe uncontrolled pain   Complete by: As directed    Call MD for:  temperature >100.4   Complete by: As directed    Diet general   Complete by: As directed    Discharge instructions   Complete by: As directed    Recommendations at discharge:   Continue aspirin , Plavix  and statin  Follow-up with vascular surgery as an outpatient.  Your blood pressure is currently controlled with amlodipine  10 mg daily and Imdur  60 mg daily.  Continue to hold other medicines.  Monitor blood pressure at home and resume one at a time if needed under the supervision of a primary care provider  General discharge instructions: Follow with Primary MD Zollie Lowers, MD in 7 days  Please request  your PCP  to go over your hospital tests, procedures, radiology results at the follow up. Please get your medicines reviewed and adjusted.  Your PCP may decide to repeat certain labs or tests as needed. Do not drive, operate heavy machinery, perform activities at heights, swimming or participation in water  activities or provide baby sitting services if your were admitted for syncope or siezures until you have seen by Primary MD or a Neurologist and advised to do so again. [place]  Controlled Substance Reporting System database was reviewed. Do not drive, operate heavy machinery, perform activities at heights, swim, participate in water  activities or provide baby-sitting services while on medications for pain, sleep and mood until your outpatient physician has reevaluated you and advised to do so again.  You are strongly recommended to comply with the dose, frequency and duration of prescribed medications. Activity: As tolerated with Full fall precautions use walker/cane & assistance as needed Avoid using any recreational substances like cigarette, tobacco, alcohol, or non-prescribed drug. If you experience worsening of your admission symptoms, develop shortness of breath, life threatening emergency, suicidal or homicidal thoughts you must seek medical attention immediately by calling 911 or calling your MD immediately  if symptoms less severe. You must read complete instructions/literature along with all the possible adverse reactions/side effects for all the medicines you take and that have been prescribed to you. Take any new medicine only after you have completely understood and accepted all the possible adverse reactions/side effects.  Wear Seat belts while driving. You were cared for by a hospitalist during your hospital stay. If you have any questions about your discharge medications or the care you received while you were in the hospital after you are discharged, you can call the unit and ask to  speak with the hospitalist or the covering physician. Once you are discharged, your primary care physician will handle any further medical issues. Please note that NO REFILLS for any discharge medications will be authorized once you are discharged, as it is imperative that you return to your primary care physician (or establish a relationship with a primary care physician if you do not have one).   Discharge wound care:   Complete by: As directed    Increase activity slowly   Complete by: As directed        Discharge Exam:   Vitals:   04/16/24 0400 04/16/24 0414 04/16/24 0600 04/16/24 0814  BP: (!) 150/83 (!) 137/53 130/60 130/66  Pulse: 78 82 67 85  Resp:  18  16  Temp:  99.5 F (37.5 C)  98.6 F (37 C)  TempSrc:  Oral  Oral  SpO2: 96% 97% 95% 98%  Weight:      Height:        Body mass index is 21.16 kg/m.   General exam: Pleasant, elderly African-American female.  Not in distress.  No pain. Skin: No rashes, lesions or ulcers. HEENT: Atraumatic, normocephalic, no obvious bleeding Lungs: Clear to auscultation bilaterally,  CVS: S1, S2, no murmur,    GI/Abd: Soft, nontender, nondistended, bowel sound present,   CNS: alert, awake, oriented x 3 Psychiatry: Mood appropriate Extremities: No pedal edema, no calf tenderness.   The results of significant diagnostics from this hospitalization (including imaging, microbiology, ancillary and laboratory) are listed below for reference.    Procedures and Diagnostic Studies:   PERIPHERAL VASCULAR CATHETERIZATION Result Date: 04/14/2024 Images from the original result were not included. Patient name: [name] MRN: [id number] DOB: Nov 27, 1940 Sex: female 04/14/2024 Pre-operative Diagnosis: Right toe ulcer Post-operative diagnosis:  Same Surgeon:  Malvina New Procedure Performed:  1.  Ultrasound-guided access, left femoral artery  2.  Aortobifemoral angiogram  3.  Right leg angiogram  4.  Selective injection with catheter in right  superficial femoral artery  5.  Stent, right superficial femoral and popliteal artery  6.  Mechanical thrombectomy, right superficial femoral-popliteal artery  7.  Conscious sedation, 48 minutes  8.  Closure was, Mynx Indications: This is a 83 year old female with right fifth toe ulcer.  She comes in today for angiographic evaluation and possible invention. Procedure:  The patient was identified in the holding area and taken to room 8.  The patient was then placed supine on the table and prepped and draped in the usual sterile fashion.  A time out was called.  Conscious sedation was administered with the use of IV fentanyl  and Versed  under continuous physician and nurse monitoring.  Heart rate, blood pressure, and oxygen saturation were continuously monitored.  Total sedation time was 48 minutes.  Ultrasound was used to evaluate the left common femoral artery.  It was patent .  A digital ultrasound image was acquired.  A micropuncture needle was used to access the left common femoral artery under ultrasound guidance.  An 018 wire was advanced without resistance and a micropuncture sheath was placed.  The 018 wire was removed and a benson wire was placed.  The micropuncture sheath was exchanged for a 5 french sheath.  An omniflush catheter was advanced over the wire to the level of L-1.  An abdominal angiogram was obtained.  Next, using the omniflush catheter and a benson wire, the aortic bifurcation was crossed and the catheter was placed into theright external iliac artery and right runoff was obtained. Findings:  Aortogram: No significant renal artery stenosis was visualized.  The infrarenal abdominal aorta is widely patent.  Bilateral common and external iliac arteries are widely patent.  Bilateral common femoral arteries are widely patent.  Left Lower Extremity: The left common femoral profundofemoral artery widely patent.  The superficial femoral artery is diffusely diseased with a occlusion in the adductor  canal into the above-knee popliteal artery.  There is three-vessel runoff. Intervention: After the above images were acquired the decision was made to proceed with intervention.  A 6 x 45 cm sheath was advanced into the right superficial femoral artery.  Selective injections with the catheter in the superficial femoral artery were performed to confirm the occlusion.  Next using an 035 Glidewire and a quick cross catheter the occlusion was easily crossed.  A selective injection with the catheter was passed beyond the lesion and the popliteal artery was performed.  A 035 versa core wire was then placed.  Because of how easily the wire went through the lesion and because there was concern for a subacute process, I elected to perform mechanical thrombectomy to potentially remove any potential embolic debris.  A penumbra CAT 6 device was advanced across the lesion.  3 passes down the back were made.  We did evacuate some thrombus in the canister.  I then primarily stented the superficial femoral-popliteal artery placing two 6 x 150 overlapping Eluvia stents that were postdilated with a 5 mm balloon.  Completion imaging showed widely patent superficial femoral artery with no change in runoff.  The long sheath was exchanged out for a short 6 French sheath and the groin was closed with a Mynx. Impression:  #1  Right superficial femoral/popliteal artery occlusion, successfully crossed and treated using overlapping 6 mm Eluvia drug eluding stents.  Mechanical thrombectomy of the occlusion was performed prior to stenting  #2  Three-vessel runoff to the right leg  #3  No significant aortoiliac occlusive disease V. Malvina New, M.D., FACS [facility] Office: [phone] Pager:  [phone]   CT Angio Aortobifemoral W and/or Wo Contrast Result Date: 04/13/2024 EXAM: CTA ABDOMEN AND PELVIS WITH AND WITHOUT CONTRAST AND RUNOFF CTA OF THE LOWER EXTREMITIES WITH CONTRAST 04/13/2024 04:58:27 PM  TECHNIQUE: CTA images of the abdomen, pelvis and lower extremities with and without intravenous contrast (100 mL iohexol  (OMNIPAQUE ) 350 MG/ML injection). Three-dimensional MIP/volume rendered formations were performed. Automated exposure control, iterative reconstruction, and/or weight based adjustment of the mA/kV was utilized to reduce the radiation dose to as low as reasonably achievable. COMPARISON: CT abdomen and pelvis dated 03/08/2024. CLINICAL HISTORY: Claudication or leg ischemia. FINDINGS: VASCULATURE: AORTA: Severe atherosclerotic plaque of the abdominal aorta. No abdominal aortic aneurysm. No dissection. CELIAC TRUNK: Mild narrowing of the origin of the celiac artery. Mild atherosclerotic plaque in the celiac artery. No occlusion or significant stenosis. SUPERIOR MESENTERIC ARTERY: No acute finding. No occlusion or significant stenosis. INFERIOR MESENTERIC ARTERY: No acute finding. No occlusion or significant stenosis. RENAL ARTERIES: Mild-to-moderate narrowing of the origin of the left renal artery. Mild narrowing of the origin of the right renal artery. No occlusion or significant stenosis. RIGHT ILIAC ARTERIES: Severe atherosclerotic plaque. No occlusion or significant stenosis. RIGHT FEMORAL ARTERIES: Severe atherosclerotic plaque of the femoral arteries. Complete occlusion of the proximal mid superficial right femoral artery with limited evaluation due to partial collimated off-view. The right superficial femoral artery reconstitutes distally with a diminished caliber down to 3 mm. RIGHT POPLITEAL ARTERY: Complete occlusion of the origin of the right popliteal artery measuring approximately 6 cm in the craniocaudal dimension. Reconstitution distally via collaterals with trifurcation of the right popliteal artery. RIGHT CALF ARTERIES: Complete occlusion of the right mid peroneal artery. Diminished arterial flow is noted to the right foot via the posterior and anterior tibial arteries. LEFT ILIAC  ARTERIES: Severe atherosclerotic plaque. No occlusion or significant stenosis. LEFT FEMORAL ARTERIES: Severe atherosclerotic plaque of the femoral arteries. Narrowed caliber of the left superficial femoral artery due to atherosclerotic plaque with caliber down to 3 mm. No complete occlusion of the left femoral arteries. LEFT POPLITEAL ARTERY: Normal 3-vessel trifurcation of the left popliteal artery. No occlusion or significant stenosis. LEFT CALF ARTERIES: No acute finding. No occlusion or significant stenosis. ABDOMEN AND PELVIS: LOWER CHEST: Visualized portion of the lower chest demonstrates no acute abnormality. LIVER: 1.6 cm left hepatic lobe and 1.4 cm right hepatic lobe fluid density lesions that may represent cysts versus hepatic hemangiomas. GALLBLADDER AND BILE DUCTS: Gallbladder is unremarkable. No biliary ductal dilatation. SPLEEN: The spleen is unremarkable. PANCREAS: Diffusely atrophic. No focal lesion. Otherwise normal pancreatic contour. No surrounding inflammatory changes. No main pancreatic ductal dilatation. ADRENAL GLANDS: Bilateral adrenal glands demonstrate no acute abnormality. KIDNEYS, URETERS AND BLADDER: Left renal cortical scarring again noted. No hydroureteronephrosis or nephroureterolithiasis bilaterally. No evidence of perinephric or periureteral stranding. Urinary bladder is unremarkable. GI AND Bowel: Stomach and duodenal sweep demonstrate no acute abnormality. Colonic diverticulosis. No small or large bowel thickening or dilatation. The appendix is unremarkable. There is no bowel obstruction. REPRODUCTIVE: Reproductive organs are unremarkable. PERITONEUM AND RETROPERITONEUM: No ascites or free air. LYMPH NODES: No evidence of lymphadenopathy. BONES AND SOFT TISSUES: No acute abnormality of the bones. No acute soft tissue abnormality. IMPRESSION: 1. Complete occlusion of the proximal to mid right superficial femoral artery with distal reconstitution and diminished caliber to 3 mm,  noting limited evaluation due to partial collimation off-view. 2. Complete occlusion of the origin of the right popliteal artery (~6 cm length) with distal collateral reconstitution. 3. Complete occlusion of the right mid peroneal artery with diminished flow to the right foot via the anterior and posterior tibial arteries. 4. Severe atherosclerotic disease of the abdominal aorta, iliac, and femoral arteries. 5. Left superficial femoral artery stenosis with luminal caliber narrowed to 3 mm without complete occlusion. 3-vessel runoff to the left foot and ankle. Electronically signed by: Morgane Naveau MD 04/13/2024 05:56 PM EST RP Workstation: [id number]   DG Chest 2 View Result Date: 04/13/2024 CLINICAL DATA:  Bilateral foot pain and swelling. EXAM: CHEST - 2 VIEW COMPARISON:  03/08/2024 and CT chest 09/16/2022. FINDINGS: Trachea is midline. Heart size normal. Thoracic aorta is calcified. Lungs are hyperinflated. Streaky density in the apical right upper lobe, somewhat improved. Lungs are otherwise clear. No pleural fluid. Degenerative changes in the spine. IMPRESSION: 1. Streaky scarring in the right upper lobe. 2. No acute findings. Electronically Signed   By: Newell Eke M.D.   On: 04/13/2024 12:46     Labs:   Basic Metabolic Panel: Recent Labs  Lab 04/13/24 1246 04/14/24 0447 04/15/24 0435 04/16/24 0359  NA 142 140 140 140  K 4.0 4.0 5.0 4.0  CL 105 105 107 107  CO2 25 23 22 22   GLUCOSE 94 93 93 90  BUN 12 8 12 10   CREATININE 1.11* 1.06* 1.46* 1.15*  CALCIUM  9.1 8.3* 8.2* 8.0*   GFR Estimated Creatinine Clearance: 29 mL/min (A) (by C-G formula based on SCr of 1.15 mg/dL (H)). Liver Function Tests: No results for input(s): AST, ALT, ALKPHOS, BILITOT, PROT, ALBUMIN in the last 168 hours. No results for input(s): LIPASE, AMYLASE in the last 168 hours. No results for input(s): AMMONIA in the last 168 hours. Coagulation profile No results for input(s): INR,  PROTIME in the last 168 hours.  CBC: Recent Labs  Lab 04/13/24 1246 04/14/24 0447 04/15/24 0435 04/16/24 0359  WBC 5.8 4.6 6.8 6.0  HGB 13.2 11.1* 10.0* 9.3*  HCT 42.5 35.0* 31.1* 28.7*  MCV 94.0 92.3 93.4 91.1  PLT 167 181 171 170   Cardiac Enzymes: No results for input(s): CKTOTAL, CKMB, CKMBINDEX, TROPONINI in the last 168 hours. BNP: Invalid input(s): POCBNP CBG: Recent Labs  Lab 04/14/24 1609  GLUCAP 70   D-Dimer No results for input(s): DDIMER in the last 72 hours. Hgb A1c No results for input(s): HGBA1C in the last 72 hours. Lipid Profile Recent Labs    04/15/24 0435  CHOL 139  HDL 33*  LDLCALC 96  TRIG 52  CHOLHDL 4.2   Thyroid  function studies No results for input(s): TSH, T4TOTAL, T3FREE, THYROIDAB in the last 72 hours.  Invalid input(s): FREET3 Anemia work up No results for input(s): VITAMINB12, FOLATE, FERRITIN, TIBC, IRON, RETICCTPCT in the last 72 hours. Microbiology No results found for this or any previous visit (from the past 240 hours).  Time coordinating discharge: 45 minutes  Signed: Lillianah Swartzentruber  [facility] 04/16/2024, 11:27 AM

## 2024-04-16 NOTE — TOC Initial Note (Signed)
 Transition of Care [facility]) - Initial/Assessment Note    Patient Details  Name: [name] MRN: [id number] Date of Birth: 1941/04/16  [facility]) CM/SW Contact:    Sudie Erminio Deems, RN Phone Number: 04/16/2024, 11:55 AM  Clinical Narrative: Patient presented for left foot swelling. PTA patient was from home with spouse. Patient states she has a PCP and Insurance. Patient gets transportation via [facility]. ICM discussed Home Health with the patient and she does not have an agency preference. ICM submitted referral via the [facility] and [facility] will service the patient. Start of care to begin within 24-48 hours post transition home. Patient will need a cab take her home to [place] (spouse does not drive). No further needs identified at this time.                 Expected Discharge Plan: Home w Home Health Services Barriers to Discharge: No Barriers Identified   Patient Goals and CMS Choice Patient states their goals for this hospitalization and ongoing recovery are:: Plan to return home with [facility].  Expected Discharge Plan and Services In-house Referral: NA Discharge Planning Services: CM Consult Post Acute Care Choice: Home Health Living arrangements for the past 2 months: Single Family Home Expected Discharge Date: 04/16/24                 DME Agency: NA       HH Arranged: PT HH Agency: [facility] Date HH Agency Contacted: 04/16/24 Time HH Agency Contacted: 1154 Representative spoke with at [facility] Agency: [facility]  Prior Living Arrangements/Services Living arrangements for the past 2 months: Single Family Home Lives with:: Spouse Patient language and need for interpreter reviewed:: Yes Do you feel safe going back to the place where you live?: Yes      Need for Family Participation in Patient Care: Yes (Comment) Care giver support system in place?: Yes (comment) Current home services: DME (cane and rolling walker) Criminal  Activity/Legal Involvement Pertinent to Current Situation/Hospitalization: No - Comment as needed  Activities of Daily Living   ADL Screening (condition at time of admission) Independently performs ADLs?: Yes (appropriate for developmental age) Is the patient deaf or have difficulty hearing?: No Does the patient have difficulty seeing, even when wearing glasses/contacts?: No Does the patient have difficulty concentrating, remembering, or making decisions?: No  Permission Sought/Granted Permission sought to share information with : Family Supports, Oceanographer granted to share information with : Yes, Verbal Permission Granted     Permission granted to share info w AGENCY: [facility]        Emotional Assessment Appearance:: Appears stated age Attitude/Demeanor/Rapport: Engaged Affect (typically observed): Appropriate Orientation: : Oriented to Self, Oriented to  Time, Oriented to Situation, Oriented to Place Alcohol / Substance Use: Not Applicable Psych Involvement: No (comment)  Admission diagnosis:  PAD (peripheral artery disease) [I73.9] Toe necrosis (HCC) [I96] Patient Active Problem List   Diagnosis Date Noted   Toe necrosis (HCC) 04/13/2024   Peripheral artery disease 04/13/2024   Acute pancreatitis 03/08/2024   Lumbar radiculopathy 09/05/2023   History of CVA (cerebrovascular accident) 07/15/2023   B12 deficiency 09/20/2022   Hypertensive urgency 09/16/2022   Left renal artery stenosis 09/16/2022   GERD (gastroesophageal reflux disease) 03/25/2022   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   Unintentional weight loss 03/19/2022   PAD (peripheral artery disease) -Atherosclerotic ulcer of aorta -posterior aspect of the descending aorta measuring up to 4 mm in thickness 01/24/2020  Tobacco abuse 01/23/2020   PAD/Atherosclerotic ulcer of aorta -posterior aspect of the descending aorta measuring up to 4 mm in thickness 01/22/2020    GAD (generalized anxiety disorder) 08/26/2019   Osteopenia of multiple sites 08/26/2019   Depression, recurrent 04/21/2019   Stage 3b chronic kidney disease (HCC) 04/21/2019   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 [place] [address] Phone: [phone] Fax: [phone]  [facility] [address] [place] [address] Phone: [phone] Fax: [phone]     Social Drivers of Health (SDOH) Social History: SDOH Screenings   Food Insecurity: No Food Insecurity (04/13/2024)  Housing: Low Risk  (04/13/2024)  Transportation Needs: Unmet Transportation Needs (04/13/2024)  Utilities: Not At Risk (04/13/2024)  Alcohol Screen: Low Risk  (11/14/2023)  Depression (PHQ2-9): Medium Risk (04/06/2024)  Financial Resource Strain: Medium Risk (03/20/2024)  Physical Activity: Insufficiently Active (11/14/2023)  Social Connections: Socially Integrated (04/13/2024)  Stress: Stress Concern Present (11/14/2023)  Tobacco Use: Medium Risk (04/13/2024)  Health Literacy: Adequate Health Literacy (11/14/2023)   SDOH Interventions:     Readmission Risk Interventions    07/15/2023   12:11 PM  Readmission Risk Prevention Plan  Post Dischage Appt Complete  Medication Screening Complete  Transportation Screening Complete

## 2024-04-17 ENCOUNTER — Telehealth: Payer: Self-pay | Admitting: *Deleted

## 2024-04-17 ENCOUNTER — Ambulatory Visit: Payer: Self-pay

## 2024-04-17 ENCOUNTER — Telehealth: Payer: Self-pay

## 2024-04-17 NOTE — Transitions of Care (Post Inpatient/ED Visit) (Signed)
   04/17/2024  Name: [name] MRN: [id number] DOB: 1940/07/27  Today's TOC FU Call Status: Today's TOC FU Call Status:: Unsuccessful Call (1st Attempt) Unsuccessful Call (1st Attempt) Date: 04/17/24  Attempted to reach the patient regarding the most recent Inpatient/ED visit.  Follow Up Plan: Additional outreach attempts will be made to reach the patient to complete the Transitions of Care (Post Inpatient/ED visit) call.   Mliss Creed [facility], BSN RN Care Manager/ Transition of Care [place]/ [facility] [phone]

## 2024-04-17 NOTE — Telephone Encounter (Signed)
 The patient called reporting that her right foot is still swollen post right leg angiogram on 04/14/2024.   The patient was advised to elevate the foot above heart level when ever possible and to call this office or seek ER if swelling worsens there is a color, temperature change or she experiences increased pain in the foot.  The patient verbalized understanding of this advice.

## 2024-04-17 NOTE — Telephone Encounter (Signed)
 FYI Only or Action Required?: Pt would like to know if it is ok to soak foot/toe in Epson salt or if anything else might be helpful.  Toe is still black and is oozing a bit.  Patient was last seen in primary care on 04/06/2024 by Zollie Lowers, MD.  Called Nurse Triage reporting Advice Only.  Symptoms began Unsure  - pt seen at ED .  Interventions attempted: Other: ED.  Symptoms are: unchanged.  Triage Disposition: Call PCP Now  Patient/caregiver understands and will follow disposition?: Yes                        Copied from CRM #[id number]. Topic: Clinical - Red Word Triage >> Apr 17, 2024  8:44 AM [name] wrote: [name] that prompted transfer to Nurse Triage: Patine has swelling in left foot Reason for Disposition  [1] Follow-up call from patient regarding patient's clinical status AND [2] information urgent  Answer Assessment - Initial Assessment Questions 1. REASON FOR CALL or QUESTION: What is your reason for calling today? or How can I best     Pt would like to know if she should soak her foot/toe in Epson salt - No discharge notes regarding this 2. CALLER: Document the source of call. (e.g., laboratory staff, caregiver or patient).     Pt  Protocols used: PCP Call - No Triage-A-AH

## 2024-04-20 ENCOUNTER — Telehealth: Payer: Self-pay | Admitting: *Deleted

## 2024-04-20 NOTE — Transitions of Care (Post Inpatient/ED Visit) (Signed)
 04/20/2024  Name: [name] MRN: [id number] DOB: 06-07-1940  Today's TOC FU Call Status: Today's TOC FU Call Status:: Successful TOC FU Call Completed TOC FU Call Complete Date: 04/20/24  Patient's Name and Date of Birth confirmed. Name, DOB  Transition Care Management Follow-up Telephone Call Date of Discharge: 04/16/24 Discharge Facility: [name] [facility]) Type of Discharge: Inpatient Admission Primary Inpatient Discharge Diagnosis:: Toe Necrosis How have you been since you were released from the hospital?: Better (appetite good, no issues with bowel/ bladder) Any questions or concerns?: No  Items Reviewed: Did you receive and understand the discharge instructions provided?: Yes Medications obtained,verified, and reconciled?: Yes (Medications Reviewed) Any new allergies since your discharge?: No Dietary orders reviewed?: Yes Type of Diet Ordered:: heart healthy Do you have support at home?: Yes Name of Support/Comfort Primary Source: significant other  Medications Reviewed Today: Medications Reviewed Today     Reviewed by Aura Mliss LABOR, RN (Registered Nurse) on 04/20/24 at 1608  Med List Status: <None>   Medication Order Taking? Sig Documenting Provider Last Dose Status Informant  acetaminophen  (TYLENOL ) 500 MG tablet [id number] Yes Take 2 tablets (1,000 mg total) by mouth 3 (three) times daily. Zollie Lowers, MD  Active Self, Pharmacy Records  albuterol  (VENTOLIN  HFA) 108 [phone] Base) MCG/ACT inhaler [id number] Yes Inhale 2 puffs into the lungs every 4 (four) hours as needed for wheezing or shortness of breath. Pearlean Manus, MD  Active Self, Pharmacy Records  alendronate  (FOSAMAX ) 70 MG tablet [id number] Yes Take 1 tablet (70 mg total) by mouth every 7 (seven) days. Take with a full glass of water  on an empty stomach. Zollie Lowers, MD  Active Self, Pharmacy Records           Med Note EFRAIM, ALFREIDA CROME   Wed Apr 15, 2024  2:37 PM)    amLODipine  (NORVASC ) 10 MG  tablet [id number] Yes Take 1 tablet (10 mg total) by mouth daily. Arlice Reichert, MD  Active   aspirin  EC 81 MG tablet [id number] Yes Take 1 tablet (81 mg total) by mouth daily. Swallow whole. Arlice Reichert, MD  Active   atorvastatin  (LIPITOR ) 80 MG tablet [id number] Yes Take 1 tablet (80 mg total) by mouth daily. Arlice Reichert, MD  Active   clopidogrel  (PLAVIX ) 75 MG tablet [id number] Yes Take 1 tablet (75 mg total) by mouth daily. Pearlean Manus, MD  Active Self, Pharmacy Records  fluticasone  furoate-vilanterol (BREO ELLIPTA ) 200-25 MCG/ACT AEPB [id number] Yes Inhale 1 puff into the lungs daily. Pearlean Manus, MD  Active Self, Pharmacy Records  furosemide  (LASIX ) 20 MG tablet [id number] Yes TAKE 1 TABLET (20 MG TOTAL) BY MOUTH DAILY. FOR SWELLING  Patient taking differently: Take 20 mg by mouth as needed for fluid. For swelling   Raford Riggs, MD  Active Self, Pharmacy Records  isosorbide  mononitrate (IMDUR ) 60 MG 24 hr tablet [id number] Yes Take 1 tablet (60 mg total) by mouth daily. Arlice Reichert, MD  Active   oxyCODONE  (OXYCONTIN ) 10 mg 12 hr tablet [id number]  Take 10 mg by mouth every 4 (four) hours as needed.  Patient not taking: Reported on 04/20/2024   [provider]  Active Self, Pharmacy Records  pantoprazole  (PROTONIX ) 40 MG tablet [id number] Yes Take 1 tablet (40 mg total) by mouth daily.  Patient taking differently: Take 40 mg by mouth as needed (as needed).   Pearlean Manus, MD  Active Self, Pharmacy Records  potassium chloride  SA (KLOR-CON  M) 20 MEQ tablet [id number] Yes Take 1 tablet (20  mEq total) by mouth daily. For potassium replacement/ supplement  Patient taking differently: Take 20 mEq by mouth as needed (as needed). For potassium replacement/ supplement   Zollie Lowers, MD  Active Self, Pharmacy Records  pregabalin  (LYRICA ) 300 MG capsule [id number] Yes Take 1 capsule (300 mg total) by mouth at bedtime. To reduce back pain Zollie Lowers, MD  Active Self,  Pharmacy Records  rOPINIRole  (REQUIP ) 1 MG tablet [id number] Yes TAKE 1 TABLET (1 MG TOTAL) BY MOUTH AT BEDTIME. FOR LEG CRAMPS Zollie Lowers, MD  Active Self, Pharmacy Records  tizanidine  (ZANAFLEX ) 2 MG capsule [id number] Yes Take 2 mg by mouth at bedtime. [provider]  Active Self, Pharmacy Records            Home Care and Equipment/Supplies: Were Home Health Services Ordered?: Yes Name of Home Health Agency:: [phone] Has Agency set up a time to come to your home?: No (spoke with [name], they have orders and will see pt for start of care within next 1-2 days, they will call pt to schedule the visit) EMR reviewed for Home Health Orders: Orders present/patient has not received call (refer to CM for follow-up) Any new equipment or medical supplies ordered?: No  Functional Questionnaire: Do you need assistance with bathing/showering or dressing?: Yes (shower seat) Do you need assistance with meal preparation?: No Do you need assistance with eating?: No Do you have difficulty maintaining continence: No Do you need assistance with getting out of bed/getting out of a chair/moving?: Yes (cane, walker) Do you have difficulty managing or taking your medications?: No  Follow up appointments reviewed: PCP Follow-up appointment confirmed?: Yes Date of PCP follow-up appointment?: 04/21/24 Follow-up Provider: Dr. Lowers [facility] Follow-up appointment confirmed?: Yes Date of Specialist follow-up appointment?: 04/27/24 Follow-Up Specialty Provider:: Reche Finder Do you need transportation to your follow-up appointment?: No Do you understand care options if your condition(s) worsen?: Yes-patient verbalized understanding  SDOH Interventions Today    Flowsheet Row Most Recent Value  SDOH Interventions   Food Insecurity Interventions Intervention Not Indicated  Housing Interventions Intervention Not Indicated  Transportation Interventions Intervention  Not Indicated  Utilities Interventions Intervention Not Indicated    Goals Addressed             This Visit's Progress    VBCI Transitions of Care (TOC) Care Plan       Problems:  Recent Hospitalization for treatment of Toe Necrosis Knowledge Deficit Related to Toe Necrosis Pt reports right 5th digit  is still black and some of the other toes are becoming discolored too  pt to follow up with vascular for plan of care going forward  Goal:  Over the next 30 days, the patient will not experience hospital readmission  Interventions:  Transitions of Care: Doctor Visits  - discussed the importance of doctor visits Reviewed Signs and symptoms of infection and pancreatitis Reviewed diet/ food choices, importance of limiting fatty foods Reviewed safety precautions, energy conservation Reviewed signs/ symptoms infection Pain assessment completed- pt denies pain today, reinforced relaxation, adequate rest, keeping stress to a minimum, take tylenol  as prescribed Depression screening completed SDOH screenings completed Reviewed all upcoming scheduled appointments including primary care provider 04/21/24 Provided pt contact # for RN CM Spoke with Marcie at [place], start of care will be within 1-2 days, they will contact pt to schedule a time, pt verbalizes understanding   Patient Self Care Activities:  Attend all scheduled provider appointments Call pharmacy for medication refills 3-7 days  in advance of running out of medications Notify RN Care Manager of TOC call rescheduling needs Participate in Transition of Care Program/Attend TOC scheduled calls Perform all self care activities independently  Perform IADL's (shopping, preparing meals, housekeeping, managing finances) independently Take medications as prescribed   [facility] will call to schedule a home visit [phone]  Plan:  Telephone outreach- 04/30/24 @ 215 pm         Mliss Creed [facility], BSN RN Care Manager/  Transition of Care [place]/ [facility] [phone]

## 2024-04-21 ENCOUNTER — Ambulatory Visit: Admitting: Family Medicine

## 2024-04-21 ENCOUNTER — Encounter: Payer: Self-pay | Admitting: Family Medicine

## 2024-04-21 ENCOUNTER — Encounter (HOSPITAL_COMMUNITY): Payer: Self-pay | Admitting: Podiatry

## 2024-04-21 ENCOUNTER — Other Ambulatory Visit: Payer: Self-pay

## 2024-04-21 VITALS — BP 153/74 | HR 82 | Temp 98.3°F | Ht 61.0 in | Wt 110.0 lb

## 2024-04-21 DIAGNOSIS — I739 Peripheral vascular disease, unspecified: Secondary | ICD-10-CM

## 2024-04-21 DIAGNOSIS — I96 Gangrene, not elsewhere classified: Secondary | ICD-10-CM | POA: Diagnosis not present

## 2024-04-21 MED ORDER — KETOROLAC TROMETHAMINE 60 MG/2ML IM SOLN
60.0000 mg | Freq: Once | INTRAMUSCULAR | Status: AC
  Administered 2024-04-21: 60 mg via INTRAMUSCULAR

## 2024-04-21 NOTE — Progress Notes (Signed)
 Subjective:  Patient ID: [name], female    DOB: [birth year]  Age: 83 y.o. MRN: [id number]  CC: PAD 2 week follow up   HPI  Discussed the use of AI scribe software for clinical note transcription with the patient, who gave verbal consent to proceed.  History of Present Illness [name] is an 83 year old female with peripheral artery disease who presents with severe toe pain and the need for amputation.  She experiences severe, constant, sharp pain in her toe, which she compares to labor pains, rating it as 10 out of 10. The pain causes her whole body to jerk and feels as if her toes are 'wanting to open up'.  She has a history of peripheral artery disease and recently underwent a procedure to restore circulation to her leg, which was successful in preventing the loss of the entire leg.  She has previously visited the emergency room, where she felt her concerns were not adequately addressed. She is now seeking urgent care due to the intolerable pain.  There is a noticeable odor emanating from the affected area, which is attracting her pets. She is concerned about the risk of infection due to the deteriorating condition of the toe.  She experiences chest pain if the foot is not elevated.          04/21/2024    4:01 PM 04/20/2024    4:20 PM 04/06/2024    1:54 PM  Depression screen PHQ 2/9  Decreased Interest 2 0 1  Down, Depressed, Hopeless 2 0 1  PHQ - 2 Score 4 0 2  Altered sleeping 2  1  Tired, decreased energy 2  1  Change in appetite 2  1  Feeling bad or failure about yourself  2  1  Trouble concentrating 2  1  Moving slowly or fidgety/restless 2  1  Suicidal thoughts 2  1  PHQ-9 Score 18  9  Difficult doing work/chores Somewhat difficult      History Abaigeal has a past medical history of Acute idiopathic pericarditis (07/15/2023), [place] coronary artery calcium  score between 200 and 399 (07/15/2023), Arthritis, Atypical chest pain (03/19/2022),  Back pain with sciatica (02/21/2021), CHF (congestive heart failure) (HCC), GERD (gastroesophageal reflux disease), Headache(784.0), Hepatitis, History of gout, Hypertension, Hypertensive encephalopathy, 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   She has a past surgical history that includes Abdominal hysterectomy; Anterior cervical corpectomy (03/07/2012); Tonsillectomy; Multiple tooth extractions; Cataract extraction w/PHACO (Left, 04/12/2017); Cataract extraction w/PHACO (Right, 05/03/2017); LEFT HEART CATH AND CORONARY ANGIOGRAPHY (N/A, 07/11/2023); Lower Extremity Angiography (N/A, 04/14/2024); ABDOMINAL AORTOGRAM W/LOWER EXTREMITY (N/A, 04/14/2024); and LOWER EXTREMITY INTERVENTION (04/14/2024).   Her family history includes Asthma in her daughter; Bipolar disorder in her daughter, daughter, daughter, daughter, and son; Drug abuse in her daughter; Heart disease in her daughter and daughter; Hypertension in her daughter. She was adopted.She reports that she quit smoking about 2 years ago. Her smoking use included cigarettes. She started smoking about 63 years ago. She has a 30.9 pack-year smoking history. She has been exposed to tobacco smoke. She has never used smokeless tobacco. She reports that she does not currently use alcohol. She reports that she does not use drugs.    ROS Review of Systems  Constitutional: Negative.   HENT:  Negative for congestion.   Eyes:  Negative for visual disturbance.  Respiratory:  Negative  for shortness of breath.   Cardiovascular:  Negative for chest pain.  Gastrointestinal:  Negative for abdominal pain, constipation, diarrhea, nausea and vomiting.  Genitourinary:  Negative for difficulty urinating.  Musculoskeletal:  Negative for arthralgias and myalgias.  Neurological:   Negative for headaches.  Psychiatric/Behavioral:  Negative for sleep disturbance.     Objective:  BP (!) 153/74   Pulse 82   Temp 98.3 F (36.8 C)   Ht 5' 1 (1.549 m)   Wt 110 lb (49.9 kg)   SpO2 96%   BMI 20.78 kg/m   BP Readings from Last 3 Encounters:  04/21/24 (!) 153/74  04/16/24 (!) 136/56  04/06/24 130/76    Wt Readings from Last 3 Encounters:  04/21/24 110 lb (49.9 kg)  04/20/24 114 lb (51.7 kg)  04/13/24 112 lb (50.8 kg)     Physical Exam Physical Exam GENERAL: Alert, cooperative, well developed, no acute distress HEENT: Normocephalic, normal oropharynx, moist mucous membranes CHEST: Clear to auscultation bilaterally, No wheezes, rhonchi, or crackles CARDIOVASCULAR: Normal heart rate and rhythm, S1 and S2 normal without murmurs ABDOMEN: Soft, non-tender, non-distended, without organomegaly, Normal bowel sounds EXTREMITIES: No cyanosis or edema NEUROLOGICAL: Cranial nerves grossly intact, Moves all extremities without gross motor or sensory deficit   Assessment & Plan:  Gangrene of toe of right foot (HCC) -     Ketorolac  Tromethamine   PAD (peripheral artery disease)    Assessment and Plan Assessment & Plan Gangrene of toe with severe pain, pending amputation   Gangrene of the toe presents with severe, constant, sharp pain rated 10/10, causing whole body jerking. The toe is deteriorating with an odor, indicating active gangrene. Circulation is poor, and the artery to the toe is too small for intervention. Previous stent placement improved circulation to the foot but not the toe. Amputation is necessary due to the severity of the condition and intolerable pain, with a risk of infection from deteriorating tissue. Administered a pain shot to manage pain until morning. Advised to go to the emergency room at [facility] for urgent evaluation and potential amputation. Instructed to inform emergency room staff about the need for vascular surgeon consultation.  Provided contact information for Dr. Lana office for follow-up if needed.    Contacted Dr. Sheree of Vascular who arranged with Podiatry for pt. To Presnt to [facility] in AM for right 5th toe amputation.    Follow-up: Return in about 6 weeks (around 06/02/2024).  Butler Der, M.D.

## 2024-04-21 NOTE — Progress Notes (Addendum)
 SDW call  Patient was given pre-op instructions over the phone. Patient verbalized understanding of instructions provided.  She denies any SOB, fever or cough.  States she will be arriving by EMS from home for transport.    PCP - Dr. Butler Der Cardiologist - Dr. Annabella Scarce Pulmonary:    PPM/ICD - denies Device Orders - na Rep Notified -    Chest x-ray - 04/13/2024 EKG -  04/14/2024 Stress Test - ECHO - 07/14/2023 Cardiac Cath - 07/11/2023 PFT: 04/11/2022  Sleep Study/sleep apnea/CPAP: denies  Non-diabetic  Blood Thinner Instructions: Plavix , states was told to continue Aspirin  Instructions:states was told to continue   ERAS Protcol - Clears until 0745   Anesthesia review: Yes. Stroke, CAD, HTN, MI, CHF, recent discharge from [facility] 04/16/2024, on ASA and Plavix . Spoke to Dr. Darlyn about patient history. Patient to be evaluated by anesthesia in the morning.   Your procedure is scheduled on Wednesday April 22, 2024  Report to [facility] Main Entrance A at  0815  A.M., then check in with the Admitting office.  Call this number if you have problems the morning of surgery:  [phone]   If you have any questions prior to your surgery date call [phone]: Open Monday-Friday 8am-4pm If you experience any cold or flu symptoms such as cough, fever, chills, shortness of breath, etc. between now and your scheduled surgery, please notify us  at the above number    Remember:  Do not eat after midnight the night before your surgery  You may drink clear liquids until  0745   the morning of your surgery.   Clear liquids allowed are: Water , Non-Citrus Juices (without pulp), Carbonated Beverages, Clear Tea, Black Coffee ONLY (NO MILK, CREAM OR POWDERED CREAMER of any kind), and Gatorade   Take these medicines the morning of surgery with A SIP OF WATER :  ASA, plavix , amlodipine , atorvastatin , breo-ellipta, isosorbide   As needed: Tylenol , albuterol , protonix   As of  today, STOP taking any Aleve, Naproxen, Ibuprofen , Motrin , Advil , Goody's, BC's, all herbal medications, fish oil, and all vitamins.

## 2024-04-22 ENCOUNTER — Ambulatory Visit (HOSPITAL_COMMUNITY)
Admission: AD | Admit: 2024-04-22 | Discharge: 2024-04-22 | Disposition: A | Discharge status: Home / Self Care | Source: Ambulatory Visit | Attending: Podiatry | Admitting: Podiatry

## 2024-04-22 ENCOUNTER — Telehealth: Payer: Self-pay

## 2024-04-22 ENCOUNTER — Inpatient Hospital Stay (HOSPITAL_COMMUNITY): Admitting: Anesthesiology

## 2024-04-22 ENCOUNTER — Inpatient Hospital Stay (HOSPITAL_COMMUNITY)

## 2024-04-22 ENCOUNTER — Encounter: Disposition: A | Payer: Self-pay | Attending: Podiatry

## 2024-04-22 ENCOUNTER — Telehealth: Payer: Self-pay | Admitting: Podiatry

## 2024-04-22 DIAGNOSIS — I110 Hypertensive heart disease with heart failure: Secondary | ICD-10-CM | POA: Diagnosis not present

## 2024-04-22 DIAGNOSIS — R519 Headache, unspecified: Secondary | ICD-10-CM | POA: Insufficient documentation

## 2024-04-22 DIAGNOSIS — F419 Anxiety disorder, unspecified: Secondary | ICD-10-CM | POA: Insufficient documentation

## 2024-04-22 DIAGNOSIS — I509 Heart failure, unspecified: Secondary | ICD-10-CM | POA: Insufficient documentation

## 2024-04-22 DIAGNOSIS — M1990 Unspecified osteoarthritis, unspecified site: Secondary | ICD-10-CM | POA: Insufficient documentation

## 2024-04-22 DIAGNOSIS — I6930 Unspecified sequelae of cerebral infarction: Secondary | ICD-10-CM | POA: Insufficient documentation

## 2024-04-22 DIAGNOSIS — J449 Chronic obstructive pulmonary disease, unspecified: Secondary | ICD-10-CM | POA: Insufficient documentation

## 2024-04-22 DIAGNOSIS — I96 Gangrene, not elsewhere classified: Secondary | ICD-10-CM | POA: Diagnosis present

## 2024-04-22 DIAGNOSIS — K759 Inflammatory liver disease, unspecified: Secondary | ICD-10-CM | POA: Insufficient documentation

## 2024-04-22 DIAGNOSIS — Z7902 Long term (current) use of antithrombotics/antiplatelets: Secondary | ICD-10-CM | POA: Diagnosis not present

## 2024-04-22 DIAGNOSIS — F32A Depression, unspecified: Secondary | ICD-10-CM | POA: Diagnosis not present

## 2024-04-22 DIAGNOSIS — Z7951 Long term (current) use of inhaled steroids: Secondary | ICD-10-CM | POA: Insufficient documentation

## 2024-04-22 DIAGNOSIS — K219 Gastro-esophageal reflux disease without esophagitis: Secondary | ICD-10-CM | POA: Insufficient documentation

## 2024-04-22 DIAGNOSIS — I2510 Atherosclerotic heart disease of native coronary artery without angina pectoris: Secondary | ICD-10-CM | POA: Insufficient documentation

## 2024-04-22 DIAGNOSIS — Z7982 Long term (current) use of aspirin: Secondary | ICD-10-CM | POA: Insufficient documentation

## 2024-04-22 DIAGNOSIS — Z7983 Long term (current) use of bisphosphonates: Secondary | ICD-10-CM | POA: Diagnosis not present

## 2024-04-22 DIAGNOSIS — I130 Hypertensive heart and chronic kidney disease with heart failure and stage 1 through stage 4 chronic kidney disease, or unspecified chronic kidney disease: Secondary | ICD-10-CM

## 2024-04-22 DIAGNOSIS — Z87891 Personal history of nicotine dependence: Secondary | ICD-10-CM | POA: Insufficient documentation

## 2024-04-22 DIAGNOSIS — N1832 Chronic kidney disease, stage 3b: Secondary | ICD-10-CM

## 2024-04-22 DIAGNOSIS — Z79899 Other long term (current) drug therapy: Secondary | ICD-10-CM | POA: Diagnosis not present

## 2024-04-22 DIAGNOSIS — I252 Old myocardial infarction: Secondary | ICD-10-CM | POA: Diagnosis not present

## 2024-04-22 HISTORY — PX: AMPUTATION TOE: SHX6595

## 2024-04-22 SURGERY — AMPUTATION, TOE
Anesthesia: Monitor Anesthesia Care | Site: Toe | Laterality: Right

## 2024-04-22 MED ORDER — OXYCODONE HCL 5 MG PO TABS: 5.0000 mg | ORAL_TABLET | Freq: Once | ORAL | Status: DC | PRN

## 2024-04-22 MED ORDER — OXYCODONE HCL 5 MG/5ML PO SOLN: 5.0000 mg | Freq: Once | ORAL | Status: DC | PRN

## 2024-04-22 MED ORDER — FENTANYL CITRATE (PF) 100 MCG/2ML IJ SOLN: 25.0000 ug | INTRAMUSCULAR | Status: DC | PRN

## 2024-04-22 MED ORDER — BUPIVACAINE HCL (PF) 0.5 % IJ SOLN
INTRAMUSCULAR | Status: AC
  Filled 2024-04-22: qty 30

## 2024-04-22 MED ORDER — CHLORHEXIDINE GLUCONATE 0.12 % MT SOLN
15.0000 mL | Freq: Once | OROMUCOSAL | Status: AC
Start: 2024-04-22 — End: 2024-04-22
  Administered 2024-04-22: 15 mL via OROMUCOSAL
  Filled 2024-04-22: qty 15

## 2024-04-22 MED ORDER — ORAL CARE MOUTH RINSE: 15.0000 mL | Freq: Once | OROMUCOSAL | Status: AC

## 2024-04-22 MED ORDER — FENTANYL CITRATE (PF) 100 MCG/2ML IJ SOLN
INTRAMUSCULAR | Status: DC | PRN
  Administered 2024-04-22 (×3): 25 ug via INTRAVENOUS

## 2024-04-22 MED ORDER — BUPIVACAINE HCL (PF) 0.5 % IJ SOLN
INTRAMUSCULAR | Status: DC | PRN
  Administered 2024-04-22: 10 mL

## 2024-04-22 MED ORDER — OXYCODONE-ACETAMINOPHEN 5-325 MG PO TABS: 1.0000 | ORAL_TABLET | ORAL | 0 refills | Status: DC | PRN

## 2024-04-22 MED ORDER — PROPOFOL 10 MG/ML IV BOLUS
INTRAVENOUS | Status: DC | PRN
  Administered 2024-04-22: 40 ug via INTRAVENOUS

## 2024-04-22 MED ORDER — CLINDAMYCIN PHOSPHATE 600 MG/50ML IV SOLN
600.0000 mg | Freq: Once | INTRAVENOUS | Status: AC
  Administered 2024-04-22: 600 mg via INTRAVENOUS
  Filled 2024-04-22: qty 50

## 2024-04-22 MED ORDER — PROPOFOL 500 MG/50ML IV EMUL
INTRAVENOUS | Status: DC | PRN
  Administered 2024-04-22: 50 ug/kg/min via INTRAVENOUS

## 2024-04-22 MED ORDER — LACTATED RINGERS IV SOLN: INTRAVENOUS | Status: DC

## 2024-04-22 MED ORDER — DROPERIDOL 2.5 MG/ML IJ SOLN: 0.6250 mg | Freq: Once | INTRAMUSCULAR | Status: DC | PRN

## 2024-04-22 MED ORDER — FENTANYL CITRATE (PF) 100 MCG/2ML IJ SOLN
INTRAMUSCULAR | Status: AC
  Filled 2024-04-22: qty 2

## 2024-04-22 MED ORDER — CLINDAMYCIN HCL 300 MG PO CAPS: 300.0000 mg | ORAL_CAPSULE | Freq: Three times a day (TID) | ORAL | 0 refills | Status: AC

## 2024-04-22 MED ORDER — 0.9 % SODIUM CHLORIDE (POUR BTL) OPTIME
TOPICAL | Status: DC | PRN
  Administered 2024-04-22: 1000 mL

## 2024-04-22 MED ORDER — LIDOCAINE 2% (20 MG/ML) 5 ML SYRINGE
INTRAMUSCULAR | Status: DC | PRN
  Administered 2024-04-22: 40 mg via INTRAVENOUS

## 2024-04-22 MED ORDER — ONDANSETRON HCL 4 MG/2ML IJ SOLN
INTRAMUSCULAR | Status: DC | PRN
  Administered 2024-04-22: 4 mg via INTRAVENOUS

## 2024-04-22 SURGICAL SUPPLY — 37 items
BLADE AVERAGE 25X9 (BLADE) IMPLANT
BLADE SURG 10 STRL SS (BLADE) ×1 IMPLANT
BLADE SURG 15 STRL LF DISP TIS (BLADE) ×1 IMPLANT
BNDG COHESIVE 3X5 TAN ST LF (GAUZE/BANDAGES/DRESSINGS) ×1 IMPLANT
BNDG COMPR ESMARK 4X3 LF (GAUZE/BANDAGES/DRESSINGS) ×1 IMPLANT
BNDG ELASTIC 3INX 5YD STR LF (GAUZE/BANDAGES/DRESSINGS) ×1 IMPLANT
BNDG ELASTIC 4INX 5YD STR LF (GAUZE/BANDAGES/DRESSINGS) IMPLANT
BNDG GAUZE DERMACEA FLUFF 4 (GAUZE/BANDAGES/DRESSINGS) IMPLANT
CHLORAPREP W/TINT 26 (MISCELLANEOUS) IMPLANT
DRAPE EXTREMITY T 121X128X90 (DISPOSABLE) IMPLANT
DRSG ADAPTIC 3X8 NADH LF (GAUZE/BANDAGES/DRESSINGS) IMPLANT
DRSG XEROFORM 1X8 (GAUZE/BANDAGES/DRESSINGS) IMPLANT
ELECTRODE REM PT RTRN 9FT ADLT (ELECTROSURGICAL) ×1 IMPLANT
GAUZE PAD ABD 8X10 STRL (GAUZE/BANDAGES/DRESSINGS) IMPLANT
GAUZE SPONGE 2X2 STRL 8-PLY (GAUZE/BANDAGES/DRESSINGS) IMPLANT
GAUZE SPONGE 4X4 12PLY STRL (GAUZE/BANDAGES/DRESSINGS) ×1 IMPLANT
GAUZE STRETCH 2X75IN STRL (MISCELLANEOUS) ×1 IMPLANT
GAUZE XEROFORM 1X8 LF (GAUZE/BANDAGES/DRESSINGS) ×1 IMPLANT
GLOVE BIO SURGEON STRL SZ7.5 (GLOVE) ×1 IMPLANT
GLOVE BIOGEL PI IND STRL 7.5 (GLOVE) ×1 IMPLANT
GOWN STRL REUS W/ TWL LRG LVL3 (GOWN DISPOSABLE) ×2 IMPLANT
KIT BASIN OR (CUSTOM PROCEDURE TRAY) ×1 IMPLANT
NDL HYPO 25X1 1.5 SAFETY (NEEDLE) ×1 IMPLANT
PACK ORTHO EXTREMITY (CUSTOM PROCEDURE TRAY) ×1 IMPLANT
PACK SRG BSC III STRL LF ECLPS (CUSTOM PROCEDURE TRAY) IMPLANT
PADDING CAST ABS COTTON 4X4 ST (CAST SUPPLIES) ×2 IMPLANT
SET HNDPC FAN SPRY TIP SCT (DISPOSABLE) IMPLANT
SOLN STERILE WATER BTL 1000 ML (IV SOLUTION) ×1 IMPLANT
SPIKE FLUID TRANSFER (MISCELLANEOUS) IMPLANT
STOCKINETTE 4X48 STRL (DRAPES) IMPLANT
SUT ETHILON 3 0 FSLX (SUTURE) IMPLANT
SUT PROLENE 3 0 PS 2 (SUTURE) IMPLANT
SUT PROLENE 4 0 PS 2 18 (SUTURE) IMPLANT
SYR CONTROL 10ML LL (SYRINGE) ×1 IMPLANT
TUBE CONNECTING 12X1/4 (SUCTIONS) IMPLANT
UNDERPAD 30X36 HEAVY ABSORB (UNDERPADS AND DIAPERS) ×1 IMPLANT
YANKAUER SUCT BULB TIP NO VENT (SUCTIONS) IMPLANT

## 2024-04-22 NOTE — Op Note (Signed)
 Full Operative Report  Date of Operation: 9:45 AM, 04/22/2024   Patient: [name] - 83 y.o. female  Surgeon: Malvin Marsa FALCON, DPM   Assistant: None  Diagnosis: GANGRENE of fifth toe right foot  Procedure:  1. Right fifth toe amputation at MPJ level    Anesthesia: Monitor Anesthesia Care  No responsible provider has been recorded for the case.  No anesthesia staff entered.   Estimated Blood Loss: Minimal   Hemostasis: 1) Anatomical dissection, mechanical compression, electrocautery 2) No tourniquet was used  Implants: * No implants in log *  Materials: prolene 3-0  Injectables: 1) Pre-operatively: 10 cc of 50:50 mixture 1%lidocaine  plain and 0.5% marcaine  plain 2) Post-operatively: None   Specimens: - Pathology: Right fifth toe for path - Microbiology: none   Antibiotics: IV clindamycin  600 mg pre op  Drains: None  Complications: Patient tolerated the procedure well without complication.   Operative findings: As below in detailed report  Indications for Procedure: [name] presents to [facility], [place] with a chief complaint of gangrene and pain of right fifth toe. The patient has failed conservative treatments of various modalities. At this time the patient has elected to proceed with surgical correction. All alternatives, risks, and complications of the procedures were thoroughly explained to the patient. Patient exhibits appropriate understanding of all discussion points and informed consent was signed and obtained in the chart with no guarantees to surgical outcome given or implied.  Description of Procedure: Patient was brought to the operating room. Patient remained on their hospital bed in the supine position. A surgical timeout was performed and all members of the operating room, the procedure, and the surgical site were identified. anesthesia occurred as per anesthesia record. Local anesthetic as previously described was  then injected about the operative field in a local infiltrative block.  The operative lower extremity as noted above was then prepped and draped in the usual sterile manner. The following procedure then began.  Attention was directed to the fifth digit on the RIGHT foot. A full-thickness incision encompassing the entire digit was made using a #15 blade. Dissection was carried down to bone. The toe was secured with a towel clamp, further dissected in its entirety, and disarticulated at the MPJ and passed to the back table as a gross specimen. This was then labled and sent to pathology. The bone was noted to be soft and eroded, and consistent with osteomyelitis. All remaining necrotic and devitalized soft tissue structures were visualized and dissected away using sharp and dull dissection. Care was taken to protect all neurovascular structures throughout the dissection. Good bleeding seen at amp site. All bleeders were cauterized as necessary.   The area was then flushed with copious amounts of sterile saline. Then using the suture materials previously described, the site was closed in anatomic layers and the skin was well approximated under minimal tension.   The surgical site was then dressed with xeoform 4x4 kerlix and ace wrap. The patient tolerated both the procedure and anesthesia well with vital signs stable throughout. The patient was transferred in good condition and all vital signs stable  from the OR to recovery under the discretion of anesthesia.  Condition: Vital signs stable, neurovascular status unchanged from preoperative   Surgical plan:  Expect clean margin, stable for dc home, follow up next week wed in office will call to arrange, leave dressings c/d/I until follow up. 3 days clindamycin  on discharge. WBAT in post op shoe.   The  patient will be WBAT in a post op shoe to the operative limb until further instructed. The dressing is to remain clean, dry, and intact. Will continue to follow  unless noted elsewhere.   Marsa Honour, DPM [name]

## 2024-04-22 NOTE — Telephone Encounter (Signed)
 LVM for patient to call and schedule a follow-up appointment for the first of next week. Patient was s/p R fifth toe amputation today

## 2024-04-22 NOTE — Transfer of Care (Signed)
 Immediate Anesthesia Transfer of Care Note  Patient: [name]  Procedure(s) Performed: AMPUTATION FIFTH TOE OF RIGHT FOOT (Right: Toe)  Patient Location: PACU  Anesthesia Type:MAC  Level of Consciousness: awake  Airway & Oxygen Therapy: Patient Spontanous Breathing  Post-op Assessment: Report given to RN  Post vital signs: stable  Last Vitals:  Vitals Value Taken Time  BP 125/63 04/22/24 12:00  Temp 36.9 C 04/22/24 12:00  Pulse 75 04/22/24 12:03  Resp 15 04/22/24 12:03  SpO2 98 % 04/22/24 12:03  Vitals shown include unfiled device data.  Last Pain:  Vitals:   04/22/24 1200  TempSrc:   PainSc: 0-No pain      Patients Stated Pain Goal: 2 (04/22/24 0910)  Complications: There were no known notable events for this encounter.

## 2024-04-22 NOTE — Telephone Encounter (Signed)
 Incoming call from [place] with [facility] stating that care has not been started for patient due to not being able to reach patient to set up treatment dates.

## 2024-04-22 NOTE — Discharge Instructions (Signed)
 Foot & Ankle Surgery Patient Discharge Instructions:   Arrange to have an adult drive you home after surgery. If you had general anesthesia, it may take a day or more to fully recover. So, for at least the next 24 hours: Do not drive or use machinery or power tools; do not drink alcohol; and do not make any major decisions.   Bandages: Keep your dressings clean, dry, and intact. Do not remove or change. When bathing/showering, cover the bandage or cast with a plastic bag to keep it dry (still keep the area away from the water ) and use a chair, do not stand. Don't remove your bandage until your doctor tells you to. If your bandage gets wet or dirty, check with your doctor. You can likely replace it with a clean, dry one.  Activity: Ok to walk in post op shoe on right foot as needed for short distances  Sit or lie down when possible. Put a pillow under your heel to raise your foot above the level of your heart. Place ice bag or pack behind knee on surgical side for 20 minutes every hour that you are awake for the first 24-48 hours. You can drive again when instructed by your doctor. Wear your surgical shoe at all times unless told otherwise by your health care provider. Use crutches or a cane as directed. Follow your doctor's instructions about putting weight on your foot.  Diet: Start with liquids and light foods (such as dry toast, bananas, and applesauce). As you feel up to it, slowly return to your normal diet. Drink at least six to eight glasses of water  or other nonalcoholic fluids a day. To avoid nausea, eat before taking narcotic pain medications.  Medications: Take all medications as instructed. Take pain medications on time. Do not wait until the pain is bad before taking your medications. Avoid alcohol while on pain medications.  Call your doctor if: Continuous bleeding through dressings. If your dressings get wet. Fevers over 100.4 degrees Fahrenheit (38 degrees  Celsius). Chest pains or difficulty breathing.

## 2024-04-22 NOTE — Anesthesia Preprocedure Evaluation (Addendum)
 Anesthesia Evaluation  Patient identified by MRN, date of birth, ID band Patient awake    Reviewed: Allergy & Precautions, NPO status , Patient's Chart, lab work & pertinent test results  Airway Mallampati: II  TM Distance: >3 FB Neck ROM: Full    Dental  (+) Edentulous Upper, Edentulous Lower, Dental Advisory Given   Pulmonary COPD, Patient abstained from smoking., former smoker   Pulmonary exam normal breath sounds clear to auscultation       Cardiovascular Exercise Tolerance: Poor hypertension, Pt. on medications + CAD, + Past MI, + Peripheral Vascular Disease and +CHF  Normal cardiovascular exam Rhythm:Regular Rate:Normal  Echo 06/2023  1. Left ventricular ejection fraction, by estimation, is 55 to 60%. The  left ventricle has normal function. The left ventricle has no regional  wall motion abnormalities. There is mild left ventricular hypertrophy.  Left ventricular diastolic parameters  are consistent with Grade I diastolic dysfunction (impaired relaxation).   2. Right ventricular systolic function is normal. The right ventricular  size is normal.   3. The mitral valve is normal in structure. No evidence of mitral valve  regurgitation. No evidence of mitral stenosis.   4. The aortic valve is tricuspid. Aortic valve regurgitation is not  visualized. No aortic stenosis is present.   5. The inferior vena cava is normal in size with greater than 50%  respiratory variability, suggesting right atrial pressure of 3 mmHg.    LHC 06/2023 Moderate nonobstructive coronary artery disease   - Left ventricle: The cavity size was normal. Borderline septal hypertrophy present. Systolic function was vigorous. The estimated ejection fraction was in the range of 65% to 70%. Wall motion was normal; there were no regional wall motion abnormalities.  (2013)   Neuro/Psych  Headaches PSYCHIATRIC DISORDERS Anxiety Depression    Hx  HTN related encephalopathy, cervical myelopathy  Neuromuscular disease CVA (slight residual symptoms), Residual Symptoms    GI/Hepatic ,GERD  Medicated,,(+) Hepatitis -, AResolved   Endo/Other    Renal/GU Renal disease     Musculoskeletal  (+) Arthritis ,    Abdominal   Peds  Hematology   Anesthesia Other Findings   Reproductive/Obstetrics                              Anesthesia Physical Anesthesia Plan  ASA: 3  Anesthesia Plan: MAC   Post-op Pain Management: Minimal or no pain anticipated   Induction:   PONV Risk Score and Plan: 2 and Ondansetron , Treatment may vary due to age or medical condition, Propofol  infusion and TIVA  Airway Management Planned: Nasal Cannula  Additional Equipment:   Intra-op Plan:   Post-operative Plan:   Informed Consent: I have reviewed the patients History and Physical, chart, labs and discussed the procedure including the risks, benefits and alternatives for the proposed anesthesia with the patient or authorized representative who has indicated his/her understanding and acceptance.     Dental advisory given  Plan Discussed with: CRNA  Anesthesia Plan Comments:          Anesthesia Quick Evaluation

## 2024-04-22 NOTE — H&P (Signed)
 SURGICAL HISTORY and PHYSICAL FOOT & ANKLE SURGERY    Date Time: 04/22/24 8:34 AM Physician: Malvin DPM   History of Presenting Illness: ARIYON Cisneros is a 83 y.o. year old female presenting to [facility]  for surgery on right foot today. The patient complains of gangrene that has been present for few weeks, but that has progressively worsened during the past few days, worsening pain, asked by Dr. Sheree to eval for fifth toe amp as pt having severe pain there. Conservative management was attempted including wound care, but was ultimately unsuccessful. The patient has elected to pursue surgical management and understands the procedure, benefits, risks/complications, and alternatives to surgery. She has remained npo since MN. Otherwise feeling well today, no other complaints. Denies fever, chills, nausea, emesis, dyspnea, and chest pain.  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   Ileus (HCC) 05/15/2021   Influenza A 07/15/2023   NSTEMI (non-ST elevated myocardial infarction) (HCC) 07/10/2023   Stroke (HCC)    2023   Unintentional weight loss 03/19/2022    Past Surgical History: Past Surgical History:  Procedure Laterality Date   ABDOMINAL AORTOGRAM W/LOWER EXTREMITY N/A 04/14/2024   Procedure: ABDOMINAL AORTOGRAM W/LOWER EXTREMITY;  Surgeon: Serene Gaile ORN, MD;  Location: [facility] INVASIVE CV LAB;  Service: Cardiovascular;  Laterality: N/A;   ABDOMINAL HYSTERECTOMY      ANTERIOR CERVICAL CORPECTOMY  03/07/2012   Procedure: ANTERIOR CERVICAL CORPECTOMY;  Surgeon: Victory Gens, MD;  Location: [facility] NEURO ORS;  Service: Neurosurgery;  Laterality: N/A;  Cervical six-seven, cervical seven-thoracic one Anterior cervical decompression/diskectomy/fusion, with Cervical seven Corpectomy, reconstruction using Allograft and Alphatec plate   CATARACT EXTRACTION W/PHACO Left 04/12/2017   Procedure: CATARACT EXTRACTION PHACO AND INTRAOCULAR LENS PLACEMENT (IOC);  Surgeon: Harrie Agent, MD;  Location: [facility] ORS;  Service: Ophthalmology;  Laterality: Left;  CDE: 3.82   CATARACT EXTRACTION W/PHACO Right 05/03/2017   Procedure: CATARACT EXTRACTION PHACO AND INTRAOCULAR LENS PLACEMENT RIGHT EYE;  Surgeon: Harrie Agent, MD;  Location: [facility] ORS;  Service: Ophthalmology;  Laterality: Right;  CDE: 3.89   LEFT HEART CATH AND CORONARY ANGIOGRAPHY N/A 07/11/2023   Procedure: LEFT HEART CATH AND CORONARY ANGIOGRAPHY;  Surgeon: Elmira Newman PARAS, MD;  Location: [facility] INVASIVE CV LAB;  Service: Cardiovascular;  Laterality: N/A;   LOWER EXTREMITY ANGIOGRAPHY N/A 04/14/2024   Procedure: Lower Extremity Angiography;  Surgeon: Serene Gaile ORN, MD;  Location: [facility] INVASIVE CV LAB;  Service: Cardiovascular;  Laterality: N/A;   LOWER EXTREMITY INTERVENTION  04/14/2024   Procedure: LOWER EXTREMITY INTERVENTION;  Surgeon: Serene Gaile ORN, MD;  Location: [facility] INVASIVE CV LAB;  Service: Cardiovascular;;   MULTIPLE TOOTH EXTRACTIONS     TONSILLECTOMY      Allergies: Allergies  Allergen Reactions   Penicillins Anaphylaxis, Swelling, Rash and Other (See Comments)    Immediate rash, facial/tongue/throat swelling, SOB or lightheadedness with hypotension    Home Medications: Current Facility-Administered Medications  Medication Dose Route Frequency Provider  Last Rate Last Admin   clindamycin  (CLEOCIN ) IVPB 600 mg  600 mg Intravenous Once Birdella Sippel F, DPM       Medications Prior to Admission   Medication Sig Dispense Refill Last Dose/Taking   acetaminophen  (TYLENOL ) 500 MG tablet Take 2 tablets (1,000 mg total) by mouth 3 (three) times daily. 180 tablet PRN    albuterol  (VENTOLIN  HFA) 108 (90 Base) MCG/ACT inhaler Inhale 2 puffs into the lungs every 4 (four) hours as needed for wheezing or shortness of breath. 6.7 each 2    alendronate  (FOSAMAX ) 70 MG tablet Take 1 tablet (70 mg total) by mouth every 7 (seven) days. Take with a full glass of water  on an empty stomach. 13 tablet 3    amLODipine  (NORVASC ) 10 MG tablet Take 1 tablet (10 mg total) by mouth daily. 90 tablet 0    aspirin  EC 81 MG tablet Take 1 tablet (81 mg total) by mouth daily. Swallow whole. 30 tablet 12    atorvastatin  (LIPITOR ) 80 MG tablet Take 1 tablet (80 mg total) by mouth daily. 90 tablet 0    clopidogrel  (PLAVIX ) 75 MG tablet Take 1 tablet (75 mg total) by mouth daily. 90 tablet 3    fluticasone  furoate-vilanterol (BREO ELLIPTA ) 200-25 MCG/ACT AEPB Inhale 1 puff into the lungs daily. 30 each 5    furosemide  (LASIX ) 20 MG tablet TAKE 1 TABLET (20 MG TOTAL) BY MOUTH DAILY. FOR SWELLING (Patient taking differently: Take 20 mg by mouth as needed for fluid. For swelling) 90 tablet 3    isosorbide  mononitrate (IMDUR ) 60 MG 24 hr tablet Take 1 tablet (60 mg total) by mouth daily. 90 tablet 0    oxyCODONE  (OXYCONTIN ) 10 mg 12 hr tablet Take 10 mg by mouth every 4 (four) hours as needed.      pantoprazole  (PROTONIX ) 40 MG tablet Take 1 tablet (40 mg total) by mouth daily. (Patient taking differently: Take 40 mg by mouth as needed (as needed).) 30 tablet 5    potassium chloride  SA (KLOR-CON  M) 20 MEQ tablet Take 1 tablet (20 mEq total) by mouth daily. For potassium replacement/ supplement (Patient taking differently: Take 20 mEq by mouth as needed (as needed). For potassium replacement/ supplement) 30 tablet 5    pregabalin  (LYRICA ) 300 MG capsule Take 1 capsule (300 mg total) by mouth at bedtime. To reduce back pain 90 capsule  3    rOPINIRole  (REQUIP ) 1 MG tablet TAKE 1 TABLET (1 MG TOTAL) BY MOUTH AT BEDTIME. FOR LEG CRAMPS 90 tablet 0    tizanidine  (ZANAFLEX ) 2 MG capsule Take 2 mg by mouth at bedtime.        Social History: @IPSOCHX @  Family History: Family History  Adopted: Yes  Problem Relation Age of Onset   Bipolar disorder Daughter    Heart disease Daughter    Asthma Daughter    Bipolar disorder Son    Bipolar disorder Daughter    Bipolar disorder Daughter    Bipolar disorder Daughter    Hypertension Daughter    Heart disease Daughter    Drug abuse Daughter        OD    Review of Systems: As per HPI. A complete 10 point review of systems was performed and all other systems reviewed were negative.  Physical Exam: There were no vitals filed for this visit.  Lower Extremity Physical Exam  Vasc: Dp and pt pulses 2+ palp on RLE Neuro: sensation intact light touch MSK: edema r foot  Derm: gangrene of right 5th toe Lungs: Unlabored, no cough/wheezing Heart: RRR    Labs:  - Reviewed prior to examination  Radiology:  - Reviewed prior to examination  Assessment: TARRI GUILFOIL is 83 y.o. female with right fifth toe gangrene s/p revascularization per vascular surgery.    Plan: - Will proceed with surgical management R 5th toe amputation at MPJ level. - Procedure, benefits, risks, and alternatives discussed with patient and informed consent obtained. - Prophylactic antibiotics: clindamycin  600 mg pre op. - Surgical site marked.  Marsa F[facility]CON Tamu Golz

## 2024-04-23 NOTE — Anesthesia Postprocedure Evaluation (Signed)
 Anesthesia Post Note  Patient: [name]  Procedure(s) Performed: AMPUTATION FIFTH TOE OF RIGHT FOOT (Right: Toe)     Patient location during evaluation: PACU Anesthesia Type: MAC Level of consciousness: awake and alert Pain management: pain level controlled Vital Signs Assessment: post-procedure vital signs reviewed and stable Respiratory status: spontaneous breathing Cardiovascular status: stable Anesthetic complications: no   There were no known notable events for this encounter.  Last Vitals:  Vitals:   04/22/24 1200 04/22/24 1215  BP: 125/63 126/61  Pulse: 76 76  Resp: 11 16  Temp: 36.9 C 36.9 C  SpO2: 100% 95%    Last Pain:  Vitals:   04/22/24 1215  TempSrc:   PainSc: 0-No pain                 Norleen Pope

## 2024-04-24 ENCOUNTER — Encounter (HOSPITAL_COMMUNITY): Payer: Self-pay | Admitting: Podiatry

## 2024-04-24 LAB — SURGICAL PATHOLOGY

## 2024-04-27 ENCOUNTER — Ambulatory Visit: Payer: Self-pay

## 2024-04-27 ENCOUNTER — Ambulatory Visit (INDEPENDENT_AMBULATORY_CARE_PROVIDER_SITE_OTHER): Admitting: Family

## 2024-04-27 ENCOUNTER — Encounter (HOSPITAL_BASED_OUTPATIENT_CLINIC_OR_DEPARTMENT_OTHER): Payer: Self-pay | Admitting: Family

## 2024-04-27 ENCOUNTER — Encounter: Payer: Self-pay | Admitting: Family

## 2024-04-27 VITALS — BP 150/80 | HR 61 | Ht 61.5 in | Wt 110.2 lb

## 2024-04-27 DIAGNOSIS — E785 Hyperlipidemia, unspecified: Secondary | ICD-10-CM | POA: Diagnosis not present

## 2024-04-27 DIAGNOSIS — J4489 Other specified chronic obstructive pulmonary disease: Secondary | ICD-10-CM

## 2024-04-27 DIAGNOSIS — I10 Essential (primary) hypertension: Secondary | ICD-10-CM

## 2024-04-27 DIAGNOSIS — I739 Peripheral vascular disease, unspecified: Secondary | ICD-10-CM

## 2024-04-27 DIAGNOSIS — Z8673 Personal history of transient ischemic attack (TIA), and cerebral infarction without residual deficits: Secondary | ICD-10-CM

## 2024-04-27 MED ORDER — CLOPIDOGREL BISULFATE 75 MG PO TABS: 75.0000 mg | ORAL_TABLET | Freq: Every day | ORAL | 3 refills | Status: AC

## 2024-04-27 MED ORDER — AMLODIPINE BESYLATE 10 MG PO TABS: 10.0000 mg | ORAL_TABLET | Freq: Every day | ORAL | 1 refills | Status: AC

## 2024-04-27 MED ORDER — ISOSORBIDE MONONITRATE ER 60 MG PO TB24: 60.0000 mg | ORAL_TABLET | Freq: Every day | ORAL | 1 refills | Status: AC

## 2024-04-27 MED ORDER — ATORVASTATIN CALCIUM 80 MG PO TABS: 80.0000 mg | ORAL_TABLET | Freq: Every day | ORAL | 1 refills | Status: AC

## 2024-04-27 NOTE — Telephone Encounter (Signed)
 Attempted to reach pt, first call answered and could hear background noise but pt did not answer, second attempt unable to leave VM. Routing to clinic for follow up.

## 2024-04-27 NOTE — Telephone Encounter (Signed)
 This RN attempted to contact patient for triage. No answer, voicemail left requesting return call to clinic.

## 2024-04-27 NOTE — Patient Instructions (Addendum)
 Medication Instructions:  Continue your current medications.   Follow-Up: At [facility], you and your health needs are our priority.  As part of our continuing mission to provide you with exceptional heart care, our providers are all part of one team.  This team includes your primary Cardiologist (physician) and Advanced Practice Providers or APPs (Physician Assistants and Nurse Practitioners) who all work together to provide you with the care you need, when you need it.  Your next appointment:   6-8 weeks  Provider:   Annabella Scarce, MD or Reche Finder, NP      Other Instructions  Recommend calling surgeon's office to get your follow up wound check and pain management addressed: [phone]  [facility] at [facility] [address] [place], [place] [address] [phone]

## 2024-04-27 NOTE — Progress Notes (Signed)
 Cardiology Office Note:  .   Date:  04/27/2024  ID:  [name], DOB [birth year], MRN [id number] PCP: Tabitha Lowers, MD  [facility] Providers Cardiologist:  Annabella Scarce, MD    History of Present Illness: Tabitha   [name] is a 83 y.o. female  with a hx of CAD, hyperlipidemia, penetrating aortic ulcer, CVA, COPD, hypertension, CKD 3A, GERD.   Established with Dr. Scarce of 03/19/2022 due to atypical chest pain.  Cardiac CTA ordered and performed 03/29/2022 calcium  score 396 placing her in the 85th percentile for age/sex/race matched control.  Moderate nonobstructive disease with no significant lesion by FFR.   Admitted 10/29 - 03/27/2022 after presenting with COPD exacerbation as well as AKI.  Her ARB and HCTZ were held.     When seen 04/17/2022 hydralazine  25 mg twice daily initiated and later increased.  At visit 07/04/2022 hydrochlorothiazide  12.5 mg was added to her valsartan /HCTZ 160/12.5 mg QD.   Admitted 4/21 - 09/20/2022 with chest discomfort, hypertensive urgency.  SBP 237 requiring nicardipine  drip.  CTA chest/abdomen/pelvis showed partially calcified atherosclerotic plaque with focal stenosis of the left renal artery with moderate atrophy of the left kidney.  Evaluated by [facility] Dr. Gretta with good celiac trunk and SMA flow recommended to follow-up regarding renal stenosis in outpatient setting.  Valsartan  and hydrochlorothiazide  were discontinued due to renal function.  She was discharged on  amlodipine  10 mg daily, hydralazine  50 mg TID, isosorbide  dinitrate 20 mg TID,  bisoprolol  5 mg daily.   At visit 10/15/22,  Zetia  was added as LDL not at goal.   ED visit 12/07/22 with elevated BP which improved to 130 systolic without intervention. New age indeterminate infarct in left banglia CVA.  No focal neurological deficits noted.  At visit 12/31/2022 due to palpitations and new finding of age-indeterminate infarct of left basal ganglia (by CT 12/07/22) by CT ZIO was placed  but did not reveal atrial fibrillation.  LDL remained above goal of less than 70 she was referred to [facility] lipid clinic and started on Repatha .  At visit 05/06/2023 with elevated BP, Isordil  transitioned to Imdur  PCP had reduced Furosemide  and Metoprolol  dose with improvement in energy level, reduced doses were continued. Amlodipine  and Olmesartan  transition amlodipine -olmesartan .  Clonidine  0.1 mg patch subsequently added for BP control.PCP discontineud Metoprolol  04/2023 due to bradycardia fatigue.   Seen 06/11/23. She again was not taking cholesterol medications and Atorvastatin  resumed. BP reasonably controlled, regimen of Amlodipine -Olmesartan  10-40mg  daily, Imdur  60mg  daily, Clonidine  0.1mg  patch weekly,  and Lasix  20mg  daily continued.   She started having flulike symptoms 07/05/23. She went to [name] 07/10/23. Troponin 57 ? 366 ? 551. ProBNP 637. EKG with progressive TWI in anterior leads. She was transferred to [name] and had LHC with nonobstructive disease. Due to elevated ESR and CRP, she was started on colchicine  and NSAIDs for pericarditis.   At visit 08/06/23 Hydralazine  50mg  TID resumed for BP control. Amlodipine -Olmesartan  10-40mg  QAM, Imdur  60mg  QHS, Clonidine  0.1mg  patch weekly, Lasix  20mg  QAM were continued. Renin aldosterone and TSH unremarkable.  At visit 09/2023 due to reports of itching with Clonidine  patch, transitioned to oral Clonidine .   Since last seen admitted 02/2024 with pancreatitis. Admitted 11/17-11/20/25 with RLE critical ischemia s/p aortobifemoral angiogram, right superficial femoral and popliteal artery stenting and mechanical thrombectomy 04/14/2024.  Of note is unable to tolerate right fifth toe pain she will require amputation.  She had AKI during admission with creatinine elevation to 1.46 which improved  to 1.15 prior to discharge.  It was unclear which antihypertensive she was taking at home.  She was recommended to continue amlodipine  10 mg daily, Imdur   60 mg daily.  Clonidine  and olmesartan  were stopped.  Lasix  resumed as needed.  She saw her primary care provider 04/21/24 with gangrene of the fifth right toe with severe pain for which [facility] was contacted and she was scheduled for emergent right fifth toe amputation performed 04/22/2024.    Presents today for follow up regarding her hypertension. She is frustrated with the majority of her visit regarding her pain management after right 5th toe amputation. Discussed our office would not refill her pain medication and this would need done by [name]. She inquires about dressing change, appears [name] tried to call her to schedule follow up. Reports BP at home is okay but no specific readings are reported. Attributes some higher readings to pain and stress surrounding recent surgery. Notes some L ankle swelling (non surgical side) and encouraged to take PRN Lasix  today upon returning home.   She never started Leqvio as reported feeling like a test dummy with multiple medication changes. Discussed LDL goal <55 given her PAD history with recent stenting and lipid panel 04/15/24 with LDL 96.   Prior antihypertensives Metoprolol  - fatigue Bisoprolol  Hydrochlorothiazide  Lisinopril  - changed to ARB Clonidine  tablet - transitioned to patch Valsartan  Losartan  Olmesartan  - held when BP lower  ROS: Please see the history of present illness.    All other systems reviewed and are negative.   Studies Reviewed: .             Risk Assessment/Calculations:            Physical Exam:   VS:  BP (!) 150/80 (BP Location: Right Arm, Patient Position: Sitting, Cuff Size: Normal)   Pulse 61   Ht 5' 1.5 (1.562 m)   Wt 110 lb 3.2 oz (50 kg)   SpO2 94%   BMI 20.48 kg/m    Wt Readings from Last 3 Encounters:  04/27/24 110 lb 3.2 oz (50 kg)  04/22/24 100 lb (45.4 kg)  04/21/24 110 lb (49.9 kg)    Vitals:   04/27/24 0857  BP: (!) 150/80  Pulse: 61  Height: 5' 1.5 (1.562  m)  Weight: 110 lb 3.2 oz (50 kg)  SpO2: 94%  BMI (Calculated): 20.49    GEN: Well nourished, well developed in no acute distress NECK: No JVD; No carotid bruits CARDIAC: RRR, no murmurs, rubs, gallops RESPIRATORY:  Clear to auscultation without rales, wheezing or rhonchi  ABDOMEN: Soft, non-tender, non-distended EXTREMITIES:  No edema; No deformity   ASSESSMENT AND PLAN: .    Pericarditis -Admitted 07/10/2023 with pericarditis setting of flu  Colchicine  stop date 10/13/23. No recurrence.   Nonobstructive CAD/HLD, LDL goal <55- CTA 03/29/2022 with coronary calcium  score of 396 placing in the 85th percentile.  LHC 07/11/2023 with proximal RCA 50%, mid RCA 40%, D1 30% stenosis with moderate nonobstructive CAD. Stable with no anginal symptoms. No indication for ischemic evaluation.   GDMT Plavix  75 mg daily, atorvastatin  80 mg daily.  Metoprolol  previously discontinued due to fatigue. 03/2024 LDL 96. Prior auth for Leqvio approved, she has not started.  Discussed LDL goal less than 55 given PAD stenting. Declines additional medications today. Given handout on Leqvio which she will review and consider scheduling Leqvio injection at next OV.   HTN - Initially diagnosed in 1987 in setting of CVA. Declines medication  changes today as attributes her elevated BP in clinic to pain from her recent toe amputation. Medication management: Continue Amlodipine  10mg  daily, Imdur  60mg  daily. Refills provided. If BP persistently elevated at follow up, consider increasing Imdur  dose to 90mg . Try to avoid additional agents given polypharmacy. Secondary Hypertension Workup: 04/02/23 renal duplex with no evidence of high grade left renal artery stenosis. Prior CTA plaque at left renal artery origin. No intervention recommended. Recommended for repeat duplex and [facility] follow up in 1 year. HTN not felt to be driven by RAS.  08/2022 and 02/2023 CT normal adrenals - no evidence of pheochromocytoma. 10/2022 and 08/06/23  normal TSH 08/06/23 Renin-aldosterone unremarkable  PAD / RAS - s/p 04/14/24  s/p aortobifemoral angiogram, right superficial femoral and popliteal artery stenting and mechanical thrombectomy and 04/22/24 R fifth toe amputation. Follows with [facility] and [name]. [name] team provided with her correct phone number and Miss Rotenberg was given their direct number to call and schedule her post op visit  Hx of CVA - Continue plavix  75mg  daily, atorvastatin  80mg  daily. Prior monitor with no atrial fib.   CKDIIIa- Careful titration of diuretic and antihypertensive.    COPD/Tobacco use- Follows with pulmonology. No signs of acute exacerbation. Congratulated on continued cessation.        Dispo: follow up in 2 months  Signed, Reche GORMAN Finder, NP

## 2024-04-27 NOTE — Telephone Encounter (Signed)
 Left message stating pt should contact surgeons offices or needs to schedule with us  for a new medicATION TO BE PRESCRIBED. ls

## 2024-04-27 NOTE — Telephone Encounter (Signed)
 Called pt back. Call was answered and I could hear, what I think was the TV, but pt did not respond.  Called again - call went to VM - left message requesting they call back. Will place in call backs.       Copied from CRM [id number]. Topic: Clinical - Red Word Triage >> Apr 27, 2024 10:01 AM Alfonso ORN wrote: Red Word that prompted transfer to Nurse Triage: patient having pain rating a 10 had toe amputated 4 days ago  (originally call to get a stronger dosage of the oxyCODONE -acetaminophen  (PERCOCET) 5-325 MG tablet)    ----------------------------------------------------------------------- From previous Reason for Contact - Prescription Issue: Reason for CRM: patient request to get stronger dosage of the oxyCODONE -acetaminophen  (PERCOCET) 5-325 MG tablet    requesting

## 2024-04-28 ENCOUNTER — Encounter: Payer: Self-pay | Admitting: Podiatry

## 2024-04-28 ENCOUNTER — Ambulatory Visit: Admitting: Podiatry

## 2024-04-28 ENCOUNTER — Encounter

## 2024-04-28 ENCOUNTER — Telehealth: Payer: Self-pay

## 2024-04-28 DIAGNOSIS — Z89421 Acquired absence of other right toe(s): Secondary | ICD-10-CM

## 2024-04-28 DIAGNOSIS — I96 Gangrene, not elsewhere classified: Secondary | ICD-10-CM

## 2024-04-28 DIAGNOSIS — Z9889 Other specified postprocedural states: Secondary | ICD-10-CM

## 2024-04-28 MED ORDER — HYDROCODONE-ACETAMINOPHEN 10-325 MG PO TABS: 1.0000 | ORAL_TABLET | Freq: Four times a day (QID) | ORAL | 0 refills | Status: AC | PRN

## 2024-04-28 NOTE — Telephone Encounter (Signed)
 [place], PT with [facility] called to notify [facility] that they are unable to get in touch with the patient for her PT and SN assessments.  At this time, [facility] will non-admit the pt.  Called pt, no answer, left message to call [facility] back.  Florence Medford, PT to let him know I got his message and that I called the patient and LM.

## 2024-04-28 NOTE — Progress Notes (Signed)
 Subjective:  Patient ID: [name], female    DOB: [birth year],  MRN: [id number]  [name] presents to clinic today for:  Chief Complaint  Patient presents with   Routine Post Op    Post op. Amputation right foot fifth toe. 10 pain. Non diabetic. Wearing surgical shoe.   Patient is 1 week postop after undergoing a right fifth toe amputation by Dr. Malvin secondary to gangrene.  Patient denies fever, chills, night sweats, nausea/vomiting.  Patient denies chest pain or shortness of breath.  Patient denies calf pain.  She also had underwent a revascularization procedure by the vascular surgeon.  She notes that she is in severe pain.  The oxycodone  5 mg has not provided any relief.  She is requesting something stronger for her pain.  She notes that elevation of the foot makes the pain worse.  It feels better when it is down.  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   Ileus (HCC) 05/15/2021   Influenza A 07/15/2023   NSTEMI (non-ST elevated myocardial infarction) (HCC) 07/10/2023   Stroke (HCC)    2023   Unintentional weight loss 03/19/2022    Past Surgical History:  Procedure Laterality Date   ABDOMINAL AORTOGRAM W/LOWER EXTREMITY N/A 04/14/2024   Procedure: ABDOMINAL AORTOGRAM W/LOWER EXTREMITY;  Surgeon: Serene Gaile ORN, MD;  Location: [facility];  Service: Cardiovascular;  Laterality: N/A;   ABDOMINAL HYSTERECTOMY     AMPUTATION TOE Right 04/22/2024    Procedure: AMPUTATION FIFTH TOE OF RIGHT FOOT;  Surgeon: Malvin Marsa FALCON, DPM;  Location: [facility] OR;  Service: Orthopedics/Podiatry;  Laterality: Right;  AMPUTATION FIFTH TOE OF RIGHT FOOT   ANTERIOR CERVICAL CORPECTOMY  03/07/2012   Procedure: ANTERIOR CERVICAL CORPECTOMY;  Surgeon: Victory Gens, MD;  Location: [facility] NEURO [facility];  Service: Neurosurgery;  Laterality: N/A;  Cervical six-seven, cervical seven-thoracic one Anterior cervical decompression/diskectomy/fusion, with Cervical seven Corpectomy, reconstruction using Allograft and Alphatec plate   CATARACT EXTRACTION W/PHACO Left 04/12/2017   Procedure: CATARACT EXTRACTION PHACO AND INTRAOCULAR LENS PLACEMENT (IOC);  Surgeon: Harrie Agent, MD;  Location: [facility];  Service: Ophthalmology;  Laterality: Left;  CDE: 3.82   CATARACT EXTRACTION W/PHACO Right 05/03/2017   Procedure: CATARACT EXTRACTION PHACO AND INTRAOCULAR LENS PLACEMENT RIGHT EYE;  Surgeon: Harrie Agent, MD;  Location: [facility];  Service: Ophthalmology;  Laterality: Right;  CDE: 3.89   LEFT HEART CATH AND CORONARY ANGIOGRAPHY N/A 07/11/2023   Procedure: LEFT HEART CATH AND CORONARY ANGIOGRAPHY;  Surgeon: Elmira Newman PARAS, MD;  Location: [facility];  Service: Cardiovascular;  Laterality: N/A;   LOWER EXTREMITY ANGIOGRAPHY N/A 04/14/2024   Procedure: Lower Extremity Angiography;  Surgeon: Serene Gaile ORN, MD;  Location: [facility];  Service: Cardiovascular;  Laterality: N/A;   LOWER EXTREMITY INTERVENTION  04/14/2024   Procedure: LOWER EXTREMITY INTERVENTION;  Surgeon: Serene Gaile ORN, MD;  Location: [facility];  Service:  Cardiovascular;;   MULTIPLE TOOTH EXTRACTIONS     TONSILLECTOMY      Allergies  Allergen Reactions   Penicillins Anaphylaxis, Swelling, Rash and Other (See Comments)    Immediate rash, facial/tongue/throat swelling, SOB or lightheadedness with hypotension    Objective:  Physical Examination: There are non-palpable pedal pulses  right foot.  There is warmth to the mid-metatarsal area and then the toes feel slightly cool.  There is localized edema to the surgical area.  The incision has minimal gapping along the central portion of, and the sutures are intact.  Minimal active blood drainage is noted.  No clinical signs of infection are seen.  There is pain on palpation of the area.     Assessment/Plan: 1. Post-operative state   2. History of amputation of right fifth toe   3. Gangrene of toe of right foot (HCC)      Meds ordered this encounter  Medications   HYDROcodone -acetaminophen  (NORCO) 10-325 MG tablet    Sig: Take 1 tablet by mouth every 6 (six) hours as needed for up to 10 days for severe pain (pain score 7-10).    Dispense:  30 tablet    Refill:  0   The incision was dressed with Steri-Strips, Xeroform gauze, Betadine  and a dry sterile fluff dressing followed by an Ace wrap.  She will leave this on and keep it clean and dry until her next scheduled postoperative appointment.  Sent in prescription for hydrocodone /acetaminophen  10/325 mg number 30 tablets with no refills.   Return for 1.5 weeks for post-op #2 unless it's already scheduled.   Awanda CHARM Imperial, DPM, FACFAS [facility]     [address] [place], [place] [address]                Office [phone]  Fax [phone]

## 2024-04-30 ENCOUNTER — Telehealth: Admitting: *Deleted

## 2024-04-30 ENCOUNTER — Encounter: Payer: Self-pay | Admitting: *Deleted

## 2024-05-01 ENCOUNTER — Encounter: Payer: Self-pay | Admitting: *Deleted

## 2024-05-01 NOTE — Telephone Encounter (Signed)
 Left another detailed message giving same advise as nurse Leah. Several calls have been made to patient about this. Will close encounter.

## 2024-05-11 ENCOUNTER — Telehealth: Payer: Self-pay | Admitting: Podiatry

## 2024-05-11 ENCOUNTER — Encounter: Admitting: Podiatry

## 2024-05-11 NOTE — Progress Notes (Signed)
 Patient did not show for scheduled post-op appointment today.

## 2024-05-11 NOTE — Telephone Encounter (Signed)
 Patient wants to know if she can soak her foot in Epson salt. Please advise

## 2024-05-13 ENCOUNTER — Emergency Department (HOSPITAL_COMMUNITY)

## 2024-05-13 ENCOUNTER — Other Ambulatory Visit: Payer: Self-pay

## 2024-05-13 ENCOUNTER — Encounter: Payer: Self-pay | Admitting: Family

## 2024-05-13 ENCOUNTER — Encounter (HOSPITAL_COMMUNITY): Payer: Self-pay

## 2024-05-13 ENCOUNTER — Inpatient Hospital Stay (HOSPITAL_COMMUNITY)
Admission: EM | Admit: 2024-05-13 | Discharge: 2024-05-27 | DRG: 270 | Disposition: A | Discharge status: Skilled Nursing Facility | Attending: Internal Medicine | Admitting: Internal Medicine

## 2024-05-13 DIAGNOSIS — I70291 Other atherosclerosis of native arteries of extremities, right leg: Secondary | ICD-10-CM | POA: Diagnosis not present

## 2024-05-13 DIAGNOSIS — Y828 Other medical devices associated with adverse incidents: Secondary | ICD-10-CM | POA: Diagnosis not present

## 2024-05-13 DIAGNOSIS — I70221 Atherosclerosis of native arteries of extremities with rest pain, right leg: Secondary | ICD-10-CM | POA: Diagnosis not present

## 2024-05-13 DIAGNOSIS — I70201 Unspecified atherosclerosis of native arteries of extremities, right leg: Secondary | ICD-10-CM | POA: Diagnosis not present

## 2024-05-13 DIAGNOSIS — Z90710 Acquired absence of both cervix and uterus: Secondary | ICD-10-CM

## 2024-05-13 DIAGNOSIS — I96 Gangrene, not elsewhere classified: Secondary | ICD-10-CM | POA: Diagnosis present

## 2024-05-13 DIAGNOSIS — Z7983 Long term (current) use of bisphosphonates: Secondary | ICD-10-CM

## 2024-05-13 DIAGNOSIS — D631 Anemia in chronic kidney disease: Secondary | ICD-10-CM | POA: Diagnosis present

## 2024-05-13 DIAGNOSIS — Z8249 Family history of ischemic heart disease and other diseases of the circulatory system: Secondary | ICD-10-CM

## 2024-05-13 DIAGNOSIS — N1832 Chronic kidney disease, stage 3b: Secondary | ICD-10-CM | POA: Diagnosis present

## 2024-05-13 DIAGNOSIS — T82856A Stenosis of peripheral vascular stent, initial encounter: Secondary | ICD-10-CM | POA: Diagnosis not present

## 2024-05-13 DIAGNOSIS — I998 Other disorder of circulatory system: Secondary | ICD-10-CM | POA: Diagnosis present

## 2024-05-13 DIAGNOSIS — Z743 Need for continuous supervision: Secondary | ICD-10-CM | POA: Diagnosis not present

## 2024-05-13 DIAGNOSIS — G959 Disease of spinal cord, unspecified: Secondary | ICD-10-CM

## 2024-05-13 DIAGNOSIS — Z9889 Other specified postprocedural states: Secondary | ICD-10-CM | POA: Diagnosis not present

## 2024-05-13 DIAGNOSIS — G9341 Metabolic encephalopathy: Secondary | ICD-10-CM | POA: Diagnosis present

## 2024-05-13 DIAGNOSIS — Z7982 Long term (current) use of aspirin: Secondary | ICD-10-CM | POA: Diagnosis not present

## 2024-05-13 DIAGNOSIS — Z5982 Transportation insecurity: Secondary | ICD-10-CM

## 2024-05-13 DIAGNOSIS — I70261 Atherosclerosis of native arteries of extremities with gangrene, right leg: Principal | ICD-10-CM | POA: Diagnosis present

## 2024-05-13 DIAGNOSIS — Z720 Tobacco use: Secondary | ICD-10-CM | POA: Diagnosis present

## 2024-05-13 DIAGNOSIS — Z7951 Long term (current) use of inhaled steroids: Secondary | ICD-10-CM

## 2024-05-13 DIAGNOSIS — N179 Acute kidney failure, unspecified: Secondary | ICD-10-CM | POA: Diagnosis present

## 2024-05-13 DIAGNOSIS — T8140XA Infection following a procedure, unspecified, initial encounter: Secondary | ICD-10-CM | POA: Diagnosis present

## 2024-05-13 DIAGNOSIS — G2581 Restless legs syndrome: Secondary | ICD-10-CM | POA: Diagnosis present

## 2024-05-13 DIAGNOSIS — I130 Hypertensive heart and chronic kidney disease with heart failure and stage 1 through stage 4 chronic kidney disease, or unspecified chronic kidney disease: Secondary | ICD-10-CM | POA: Diagnosis present

## 2024-05-13 DIAGNOSIS — D696 Thrombocytopenia, unspecified: Secondary | ICD-10-CM | POA: Diagnosis present

## 2024-05-13 DIAGNOSIS — I7092 Chronic total occlusion of artery of the extremities: Secondary | ICD-10-CM | POA: Diagnosis not present

## 2024-05-13 DIAGNOSIS — I25119 Atherosclerotic heart disease of native coronary artery with unspecified angina pectoris: Secondary | ICD-10-CM | POA: Diagnosis present

## 2024-05-13 DIAGNOSIS — F32A Depression, unspecified: Secondary | ICD-10-CM | POA: Diagnosis present

## 2024-05-13 DIAGNOSIS — J4489 Other specified chronic obstructive pulmonary disease: Secondary | ICD-10-CM | POA: Diagnosis present

## 2024-05-13 DIAGNOSIS — I69351 Hemiplegia and hemiparesis following cerebral infarction affecting right dominant side: Secondary | ICD-10-CM | POA: Diagnosis not present

## 2024-05-13 DIAGNOSIS — Z7902 Long term (current) use of antithrombotics/antiplatelets: Secondary | ICD-10-CM

## 2024-05-13 DIAGNOSIS — E785 Hyperlipidemia, unspecified: Secondary | ICD-10-CM | POA: Diagnosis present

## 2024-05-13 DIAGNOSIS — M7989 Other specified soft tissue disorders: Secondary | ICD-10-CM | POA: Diagnosis not present

## 2024-05-13 DIAGNOSIS — I739 Peripheral vascular disease, unspecified: Secondary | ICD-10-CM | POA: Diagnosis present

## 2024-05-13 DIAGNOSIS — L89312 Pressure ulcer of right buttock, stage 2: Secondary | ICD-10-CM | POA: Diagnosis present

## 2024-05-13 DIAGNOSIS — M109 Gout, unspecified: Secondary | ICD-10-CM | POA: Diagnosis present

## 2024-05-13 DIAGNOSIS — D62 Acute posthemorrhagic anemia: Secondary | ICD-10-CM | POA: Diagnosis not present

## 2024-05-13 DIAGNOSIS — Z880 Allergy status to penicillin: Secondary | ICD-10-CM

## 2024-05-13 DIAGNOSIS — Z79899 Other long term (current) drug therapy: Secondary | ICD-10-CM

## 2024-05-13 DIAGNOSIS — I252 Old myocardial infarction: Secondary | ICD-10-CM | POA: Diagnosis not present

## 2024-05-13 DIAGNOSIS — K219 Gastro-esophageal reflux disease without esophagitis: Secondary | ICD-10-CM | POA: Diagnosis present

## 2024-05-13 DIAGNOSIS — S91301A Unspecified open wound, right foot, initial encounter: Secondary | ICD-10-CM | POA: Diagnosis not present

## 2024-05-13 DIAGNOSIS — Z87891 Personal history of nicotine dependence: Secondary | ICD-10-CM

## 2024-05-13 DIAGNOSIS — I509 Heart failure, unspecified: Secondary | ICD-10-CM | POA: Diagnosis not present

## 2024-05-13 DIAGNOSIS — I2510 Atherosclerotic heart disease of native coronary artery without angina pectoris: Secondary | ICD-10-CM | POA: Diagnosis not present

## 2024-05-13 DIAGNOSIS — Z7401 Bed confinement status: Secondary | ICD-10-CM | POA: Diagnosis not present

## 2024-05-13 DIAGNOSIS — I701 Atherosclerosis of renal artery: Secondary | ICD-10-CM | POA: Diagnosis present

## 2024-05-13 DIAGNOSIS — I959 Hypotension, unspecified: Secondary | ICD-10-CM | POA: Diagnosis not present

## 2024-05-13 DIAGNOSIS — I743 Embolism and thrombosis of arteries of the lower extremities: Secondary | ICD-10-CM | POA: Diagnosis not present

## 2024-05-13 DIAGNOSIS — Z89421 Acquired absence of other right toe(s): Secondary | ICD-10-CM

## 2024-05-13 DIAGNOSIS — Z556 Problems related to health literacy: Secondary | ICD-10-CM

## 2024-05-13 DIAGNOSIS — M25571 Pain in right ankle and joints of right foot: Secondary | ICD-10-CM | POA: Diagnosis not present

## 2024-05-13 DIAGNOSIS — M5416 Radiculopathy, lumbar region: Secondary | ICD-10-CM

## 2024-05-13 DIAGNOSIS — R6889 Other general symptoms and signs: Secondary | ICD-10-CM | POA: Diagnosis not present

## 2024-05-13 DIAGNOSIS — T82858A Stenosis of vascular prosthetic devices, implants and grafts, initial encounter: Secondary | ICD-10-CM | POA: Diagnosis not present

## 2024-05-13 DIAGNOSIS — Z825 Family history of asthma and other chronic lower respiratory diseases: Secondary | ICD-10-CM

## 2024-05-13 DIAGNOSIS — I5032 Chronic diastolic (congestive) heart failure: Secondary | ICD-10-CM | POA: Diagnosis present

## 2024-05-13 DIAGNOSIS — Z59868 Other specified financial insecurity: Secondary | ICD-10-CM

## 2024-05-13 LAB — COMPREHENSIVE METABOLIC PANEL WITH GFR
ALT: <5 U/L (ref 0–44)
AST: 17 U/L (ref 15–41)
Albumin: 3.6 g/dL (ref 3.5–5.0)
Alkaline Phosphatase: 58 U/L (ref 38–126)
Anion gap: 13 (ref 5–15)
BUN: 48 mg/dL — ABNORMAL HIGH (ref 8–23)
CO2: 28 mmol/L (ref 22–32)
Calcium: 8.7 mg/dL — ABNORMAL LOW (ref 8.9–10.3)
Chloride: 99 mmol/L (ref 98–111)
Creatinine, Ser: 2.65 mg/dL — ABNORMAL HIGH (ref 0.44–1.00)
GFR, Estimated: 17 mL/min — ABNORMAL LOW (ref >60)
Glucose, Bld: 101 mg/dL — ABNORMAL HIGH (ref 70–99)
Potassium: 3.5 mmol/L (ref 3.5–5.1)
Sodium: 139 mmol/L (ref 135–145)
Total Bilirubin: 0.6 mg/dL (ref 0.0–1.2)
Total Protein: 7.2 g/dL (ref 6.5–8.1)

## 2024-05-13 LAB — CBC WITH DIFFERENTIAL/PLATELET
Abs Immature Granulocytes: 0.03 K/uL (ref 0.00–0.07)
Basophils Absolute: 0 K/uL (ref 0.0–0.1)
Basophils Relative: 1 %
Eosinophils Absolute: 0.1 K/uL (ref 0.0–0.5)
Eosinophils Relative: 2 %
HCT: 35.6 % — ABNORMAL LOW (ref 36.0–46.0)
Hemoglobin: 11.4 g/dL — ABNORMAL LOW (ref 12.0–15.0)
Immature Granulocytes: 0 %
Lymphocytes Relative: 14 %
Lymphs Abs: 1.3 K/uL (ref 0.7–4.0)
MCH: 29.2 pg (ref 26.0–34.0)
MCHC: 32 g/dL (ref 30.0–36.0)
MCV: 91.3 fL (ref 80.0–100.0)
Monocytes Absolute: 0.6 K/uL (ref 0.1–1.0)
Monocytes Relative: 6 %
Neutro Abs: 6.8 K/uL (ref 1.7–7.7)
Neutrophils Relative %: 77 %
Platelets: 291 K/uL (ref 150–400)
RBC: 3.9 MIL/uL (ref 3.87–5.11)
RDW: 13.7 % (ref 11.5–15.5)
WBC: 8.9 K/uL (ref 4.0–10.5)
nRBC: 0 % (ref 0.0–0.2)

## 2024-05-13 LAB — LACTIC ACID, PLASMA: Lactic Acid, Venous: 1.1 mmol/L (ref 0.5–1.9)

## 2024-05-13 LAB — PROTIME-INR
INR: 1 (ref 0.8–1.2)
Prothrombin Time: 13.2 s (ref 11.4–15.2)

## 2024-05-13 LAB — HEPARIN LEVEL (UNFRACTIONATED): Heparin Unfractionated: 0.15 [IU]/mL — ABNORMAL LOW (ref 0.30–0.70)

## 2024-05-13 MED ORDER — ROPINIROLE HCL 1 MG PO TABS
1.0000 mg | ORAL_TABLET | Freq: Every day | ORAL | Status: DC
  Administered 2024-05-13 – 2024-05-26 (×14): 1 mg via ORAL
  Filled 2024-05-13 (×14): qty 1

## 2024-05-13 MED ORDER — SODIUM CHLORIDE 0.9% FLUSH: 3.0000 mL | INTRAVENOUS | Status: DC | PRN

## 2024-05-13 MED ORDER — ATORVASTATIN CALCIUM 80 MG PO TABS
80.0000 mg | ORAL_TABLET | Freq: Every day | ORAL | Status: DC
  Administered 2024-05-13 – 2024-05-27 (×15): 80 mg via ORAL
  Filled 2024-05-13 (×13): qty 1

## 2024-05-13 MED ORDER — BISACODYL 10 MG RE SUPP: 10.0000 mg | Freq: Every day | RECTAL | Status: DC | PRN

## 2024-05-13 MED ORDER — HEPARIN BOLUS VIA INFUSION
2000.0000 [IU] | Freq: Once | INTRAVENOUS | Status: AC
  Administered 2024-05-13: 06:00:00 2000 [IU] via INTRAVENOUS

## 2024-05-13 MED ORDER — ISOSORBIDE MONONITRATE ER 30 MG PO TB24
60.0000 mg | ORAL_TABLET | Freq: Every day | ORAL | Status: DC
  Administered 2024-05-13 – 2024-05-15 (×3): 60 mg via ORAL
  Filled 2024-05-13: qty 2
  Filled 2024-05-13 (×2): qty 1

## 2024-05-13 MED ORDER — POLYETHYLENE GLYCOL 3350 17 G PO PACK
17.0000 g | PACK | Freq: Every day | ORAL | Status: DC | PRN
  Administered 2024-05-23 – 2024-05-24 (×2): 17 g via ORAL
  Filled 2024-05-13 (×2): qty 1

## 2024-05-13 MED ORDER — ONDANSETRON HCL 4 MG PO TABS: 4.0000 mg | ORAL_TABLET | Freq: Four times a day (QID) | ORAL | Status: DC | PRN

## 2024-05-13 MED ORDER — ASPIRIN 81 MG PO TBEC
81.0000 mg | DELAYED_RELEASE_TABLET | Freq: Every day | ORAL | Status: DC
  Administered 2024-05-14 – 2024-05-27 (×14): 81 mg via ORAL
  Filled 2024-05-13 (×11): qty 1

## 2024-05-13 MED ORDER — SODIUM CHLORIDE 0.9 % IV SOLN
1.0000 g | Freq: Once | INTRAVENOUS | Status: AC
  Administered 2024-05-13: 04:00:00 1 g via INTRAVENOUS
  Filled 2024-05-13: qty 20

## 2024-05-13 MED ORDER — PANTOPRAZOLE SODIUM 40 MG PO TBEC
40.0000 mg | DELAYED_RELEASE_TABLET | Freq: Every day | ORAL | Status: DC
  Administered 2024-05-13 – 2024-05-27 (×15): 40 mg via ORAL
  Filled 2024-05-13 (×13): qty 1

## 2024-05-13 MED ORDER — SODIUM CHLORIDE 0.9% FLUSH
3.0000 mL | Freq: Two times a day (BID) | INTRAVENOUS | Status: DC
  Administered 2024-05-14 – 2024-05-27 (×20): 3 mL via INTRAVENOUS

## 2024-05-13 MED ORDER — SODIUM CHLORIDE 0.9 % IV SOLN
1.0000 g | INTRAVENOUS | Status: DC
  Administered 2024-05-13: 22:00:00 1 g via INTRAVENOUS
  Filled 2024-05-13: qty 20

## 2024-05-13 MED ORDER — ACETAMINOPHEN 325 MG PO TABS
650.0000 mg | ORAL_TABLET | Freq: Four times a day (QID) | ORAL | Status: DC | PRN
  Filled 2024-05-13 (×2): qty 2

## 2024-05-13 MED ORDER — HYDROMORPHONE HCL 1 MG/ML IJ SOLN
0.5000 mg | INTRAMUSCULAR | Status: DC | PRN
  Administered 2024-05-14 – 2024-05-15 (×2): 1 mg via INTRAVENOUS
  Filled 2024-05-13 (×2): qty 1

## 2024-05-13 MED ORDER — VANCOMYCIN HCL 500 MG/100ML IV SOLN
500.0000 mg | INTRAVENOUS | Status: DC
  Administered 2024-05-14: 08:00:00 500 mg via INTRAVENOUS
  Filled 2024-05-13 (×2): qty 100

## 2024-05-13 MED ORDER — ACETAMINOPHEN 650 MG RE SUPP: 650.0000 mg | Freq: Four times a day (QID) | RECTAL | Status: DC | PRN

## 2024-05-13 MED ORDER — PREGABALIN 100 MG PO CAPS
300.0000 mg | ORAL_CAPSULE | Freq: Every day | ORAL | Status: DC
  Administered 2024-05-13 – 2024-05-26 (×14): 300 mg via ORAL
  Filled 2024-05-13 (×13): qty 3

## 2024-05-13 MED ORDER — OXYCODONE HCL 5 MG PO TABS
5.0000 mg | ORAL_TABLET | ORAL | Status: DC | PRN
  Administered 2024-05-13 – 2024-05-27 (×17): 5 mg via ORAL
  Filled 2024-05-13 (×15): qty 1

## 2024-05-13 MED ORDER — VANCOMYCIN HCL IN DEXTROSE 1-5 GM/200ML-% IV SOLN
1000.0000 mg | Freq: Once | INTRAVENOUS | Status: AC
  Administered 2024-05-13: 04:00:00 1000 mg via INTRAVENOUS
  Filled 2024-05-13: qty 200

## 2024-05-13 MED ORDER — SODIUM CHLORIDE 0.9% FLUSH
3.0000 mL | Freq: Two times a day (BID) | INTRAVENOUS | Status: DC
  Administered 2024-05-14 (×3): 3 mL via INTRAVENOUS

## 2024-05-13 MED ORDER — ALBUTEROL SULFATE (2.5 MG/3ML) 0.083% IN NEBU: 2.5000 mg | INHALATION_SOLUTION | RESPIRATORY_TRACT | Status: DC | PRN

## 2024-05-13 MED ORDER — AMLODIPINE BESYLATE 10 MG PO TABS
10.0000 mg | ORAL_TABLET | Freq: Every day | ORAL | Status: DC
  Administered 2024-05-13 – 2024-05-15 (×3): 10 mg via ORAL
  Filled 2024-05-13 (×3): qty 1

## 2024-05-13 MED ORDER — FENTANYL CITRATE (PF) 100 MCG/2ML IJ SOLN
50.0000 ug | Freq: Once | INTRAMUSCULAR | Status: AC
  Administered 2024-05-13: 04:00:00 50 ug via INTRAVENOUS
  Filled 2024-05-13: qty 2

## 2024-05-13 MED ORDER — HYDRALAZINE HCL 20 MG/ML IJ SOLN
10.0000 mg | Freq: Four times a day (QID) | INTRAMUSCULAR | Status: DC | PRN
  Administered 2024-05-13: 18:00:00 10 mg via INTRAVENOUS
  Filled 2024-05-13: qty 1

## 2024-05-13 MED ORDER — SODIUM CHLORIDE 0.9 % IV SOLN: INTRAVENOUS | Status: AC | PRN

## 2024-05-13 MED ORDER — SODIUM CHLORIDE 0.9 % IV SOLN: INTRAVENOUS | Status: DC

## 2024-05-13 MED ORDER — FLUTICASONE FUROATE-VILANTEROL 200-25 MCG/ACT IN AEPB
1.0000 | INHALATION_SPRAY | Freq: Every day | RESPIRATORY_TRACT | Status: DC
  Administered 2024-05-16 – 2024-05-27 (×11): 1 via RESPIRATORY_TRACT
  Filled 2024-05-13: qty 28

## 2024-05-13 MED ORDER — FENTANYL CITRATE (PF) 100 MCG/2ML IJ SOLN
100.0000 ug | INTRAMUSCULAR | Status: DC | PRN
  Administered 2024-05-13 (×4): 100 ug via INTRAVENOUS
  Filled 2024-05-13 (×4): qty 2

## 2024-05-13 MED ORDER — ONDANSETRON HCL 4 MG/2ML IJ SOLN: 4.0000 mg | Freq: Four times a day (QID) | INTRAMUSCULAR | Status: DC | PRN

## 2024-05-13 MED ORDER — CLOPIDOGREL BISULFATE 75 MG PO TABS
75.0000 mg | ORAL_TABLET | Freq: Every day | ORAL | Status: DC
  Administered 2024-05-13 – 2024-05-27 (×15): 75 mg via ORAL
  Filled 2024-05-13 (×13): qty 1

## 2024-05-13 MED ORDER — HEPARIN (PORCINE) 25000 UT/250ML-% IV SOLN
800.0000 [IU]/h | INTRAVENOUS | Status: DC
  Administered 2024-05-13: 06:00:00 650 [IU]/h via INTRAVENOUS
  Filled 2024-05-13 (×2): qty 250

## 2024-05-13 MED ORDER — HEPARIN BOLUS VIA INFUSION
1000.0000 [IU] | Freq: Once | INTRAVENOUS | Status: AC
  Administered 2024-05-13: 16:00:00 1000 [IU] via INTRAVENOUS

## 2024-05-13 MED ORDER — TRAZODONE HCL 50 MG PO TABS
50.0000 mg | ORAL_TABLET | Freq: Every evening | ORAL | Status: DC | PRN
  Administered 2024-05-24: 50 mg via ORAL
  Filled 2024-05-13: qty 1

## 2024-05-13 NOTE — ED Provider Notes (Signed)
 [place] EMERGENCY DEPARTMENT AT [facility] Provider Note   CSN: [id number] Arrival date & time: 05/13/24  0234     Patient presents with: Foot Pain and Chest Pain   [name] is a 83 y.o. female.   The history is provided by the patient.  Patient with extensive medical history including CHF, hypertension, peripheral vascular disease, previous CVA presents with right foot pain.  Patient reports she had a recent toe amputation.  Over the past 24 hours she has had increasing pain in the right foot and discharge.  She also reports her toes are discolored. She also reports when the pain in her foot is severe she has chest pain. No active chest pain at this time She is not on current antibiotics.  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 Authorizing Provider  acetaminophen  (TYLENOL ) 500 MG tablet Take 2 tablets (1,000 mg total) by mouth 3 (three) times daily. 10/30/22   Zollie Lowers, MD  albuterol  (VENTOLIN  HFA) 108 [phone] Base) MCG/ACT inhaler Inhale 2 puffs into the lungs every 4 (four) hours as needed for wheezing or shortness of breath. 03/13/24   Pearlean Manus, MD   alendronate  (FOSAMAX ) 70 MG tablet Take 1 tablet (70 mg total) by mouth every 7 (seven) days. Take with a full glass of water  on an empty stomach. 02/25/24   Zollie Lowers, MD  amLODipine  (NORVASC ) 10 MG tablet Take 1 tablet (10 mg total) by mouth daily. To lower blood pressure. 04/27/24   Vannie Reche RAMAN, NP  aspirin  EC 81 MG tablet Take 1 tablet (81 mg total) by mouth daily. Swallow whole. 04/17/24   Arlice Reichert, MD  atorvastatin  (LIPITOR ) 80 MG tablet Take 1 tablet (80 mg total) by mouth daily. To lower cholesterol and protect leg stents. 04/27/24   Vannie Reche RAMAN, NP  clopidogrel  (PLAVIX ) 75 MG tablet Take 1 tablet (75 mg total) by mouth daily. To protect your leg stents. 04/27/24   Walker, Caitlin S, NP  fluticasone  furoate-vilanterol (BREO ELLIPTA ) 200-25 MCG/ACT AEPB Inhale 1 puff into the lungs daily. 03/13/24   Pearlean Manus, MD  furosemide  (LASIX ) 20 MG tablet TAKE 1 TABLET (20 MG TOTAL) BY MOUTH DAILY. FOR SWELLING Patient taking differently: Take 20 mg by mouth as needed for fluid. For swelling 12/09/23   Raford Riggs, MD  isosorbide  mononitrate (IMDUR ) 60 MG 24 hr tablet Take 1 tablet (60 mg total) by mouth daily. To lower blood pressure. 04/27/24   Vannie Reche RAMAN, NP  pantoprazole  (PROTONIX ) 40 MG tablet Take 1 tablet (40 mg total) by mouth daily. Patient taking differently: Take 40  mg by mouth as needed. 03/13/24   Pearlean Manus, MD  potassium chloride  SA (KLOR-CON  M) 20 MEQ tablet Take 1 tablet (20 mEq total) by mouth daily. For potassium replacement/ supplement Patient taking differently: Take 20 mEq by mouth as needed. For potassium replacement/ supplement 12/16/23   Zollie Lowers, MD  pregabalin  (LYRICA ) 300 MG capsule Take 1 capsule (300 mg total) by mouth at bedtime. To reduce back pain 02/25/24 02/19/25  Zollie Lowers, MD  rOPINIRole  (REQUIP ) 1 MG tablet TAKE 1 TABLET (1 MG TOTAL) BY MOUTH AT BEDTIME. FOR LEG CRAMPS 03/10/24   Zollie Lowers, MD  tizanidine   (ZANAFLEX ) 2 MG capsule Take 2 mg by mouth at bedtime.    [provider]    Allergies: Penicillins    Review of Systems  Constitutional:  Negative for fever.  Gastrointestinal:  Negative for vomiting.  Musculoskeletal:  Positive for arthralgias.  Skin:  Positive for wound.    Updated Vital Signs BP (!) 174/63   Pulse 85   Temp 97.7 F (36.5 C) (Oral)   Resp 19   Ht 1.549 m (5' 1)   SpO2 96%   BMI 20.82 kg/m   Physical Exam CONSTITUTIONAL: Elderly, comfortable appearing HEAD: Normocephalic/atraumatic ENMT: Mucous membranes moist NECK: supple no meningeal signs CV: S1/S2 noted, no murmurs/rubs/gallops noted LUNGS: Lungs are clear to auscultation bilaterally, no apparent distress ABDOMEN: soft NEURO: Pt is awake/alert/appropriate, moves all extremitiesx4.  No facial droop.   EXTREMITIES: +doppler signal in left foot There is no palpable pulse in the right foot, no Doppler signal in the right foot Diffuse tenderness is noted to the right foot. There is a blister on the lateral aspect of the right foot with crepitance The foot is cold to touch.  She has discoloration of her toes.  See photo Discharge and foul smell is noted SKIN: right foot is cold to touch, see photo PSYCH: anxious     (all labs ordered are listed, but only abnormal results are displayed) Labs Reviewed  COMPREHENSIVE METABOLIC PANEL WITH GFR - Abnormal; Notable for the following components:      Result Value   Glucose, Bld 101 (*)    BUN 48 (*)    Creatinine, Ser 2.65 (*)    Calcium  8.7 (*)    GFR, Estimated 17 (*)    All other components within normal limits  CBC WITH DIFFERENTIAL/PLATELET - Abnormal; Notable for the following components:   Hemoglobin 11.4 (*)    HCT 35.6 (*)    All other components within normal limits  CULTURE, BLOOD (ROUTINE X 2)  CULTURE, BLOOD (ROUTINE X 2)  LACTIC ACID, PLASMA  PROTIME-INR  HEPARIN  LEVEL (UNFRACTIONATED)    EKG: EKG  Interpretation Date/Time:  Wednesday May 13 2024 03:35:18 EST Ventricular Rate:  91 PR Interval:  158 QRS Duration:  98 QT Interval:  406 QTC Calculation: 500 R Axis:   -8  Text Interpretation: Sinus rhythm Anterior infarct, old No significant change since last tracing Confirmed by Midge Golas ([id number]) on 05/13/2024 3:38:09 AM  Radiology: [facility] Chest Port 1 View Result Date: 05/13/2024 EXAM: 1 VIEW(S) XRAY OF THE CHEST 05/13/2024 04:28:00 AM COMPARISON: 04/13/2024 CLINICAL HISTORY: Questionable sepsis - evaluate for abnormality FINDINGS: LUNGS AND PLEURA: Hyperinflation. No focal pulmonary opacity. No pleural effusion. No pneumothorax. HEART AND MEDIASTINUM: Aortic calcification. No acute abnormality of the cardiac and mediastinal silhouettes. BONES AND SOFT TISSUES: Cervical spine fusion hardware noted. No acute osseous abnormality. IMPRESSION: 1. No acute findings. 2. Hyperinflation. Electronically signed  by: Evalene Coho MD 05/13/2024 05:17 AM EST RP Workstation: [id number]   DG Foot Complete Right Result Date: 05/13/2024 EXAM: 3 OR MORE VIEW(S) XRAY OF THE RIGHT FOOT 05/13/2024 04:25:00 AM COMPARISON: Right foot series dated 04/22/2024. CLINICAL HISTORY: Questionable sepsis - evaluate for abnormality. FINDINGS: BONES AND JOINTS: Status post amputation of the fifth toe at the level of the metatarsophalangeal joint. Mild degenerative spurring is again noted within the midfoot. There is moderate spurring involving the calcaneus at the insertion site of the Achilles tendon and the plantar aponeurosis. There are no findings suspicious for active osteomyelitis. No acute fracture. No malalignment. SOFT TISSUES: The soft tissues are unremarkable. IMPRESSION: 1. No acute findings. 2. Status post amputation of the fifth toe at the level of the metatarsophalangeal joint. 3. Mild degenerative spurring within the midfoot. 4. Moderate spurring involving the calcaneus at the insertion site of the  Achilles tendon and the plantar aponeurosis. Electronically signed by: Evalene Coho MD 05/13/2024 05:17 AM EST RP Workstation: [id number]     .Critical Care  Performed by: Midge Golas, MD Authorized by: Midge Golas, MD   Critical care provider statement:    Critical care time (minutes):  105   Critical care start time:  05/13/2024 3:45 AM   Critical care end time:  05/13/2024 5:30 AM   Critical care time was exclusive of:  Separately billable procedures and treating other patients   Critical care was necessary to treat or prevent imminent or life-threatening deterioration of the following conditions:  Sepsis, circulatory failure and renal failure   Critical care was time spent personally by me on the following activities:  Obtaining history from patient or surrogate, examination of patient, pulse oximetry, ordering and review of radiographic studies, ordering and review of laboratory studies, ordering and performing treatments and interventions, re-evaluation of patient's condition, review of old charts, development of treatment plan with patient or surrogate, evaluation of patient's response to treatment and discussions with consultants   I assumed direction of critical care for this patient from another provider in my specialty: no     Care discussed with: admitting provider      Medications Ordered in the ED  heparin  ADULT infusion 100 units/mL (25000 units/250mL) (650 Units/hr Intravenous New Bag/Given 05/13/24 0534)  fentaNYL  (SUBLIMAZE ) injection 100 mcg (100 mcg Intravenous Given 05/13/24 0545)  fentaNYL  (SUBLIMAZE ) injection 50 mcg (50 mcg Intravenous Given 05/13/24 0339)  meropenem  (MERREM ) 1 g in sodium chloride  0.9 % 100 mL IVPB (0 g Intravenous Stopped 05/13/24 0424)    And  vancomycin  (VANCOCIN ) IVPB 1000 mg/200 mL premix (0 mg Intravenous Stopped 05/13/24 0524)  heparin  bolus via infusion 2,000 Units (2,000 Units Intravenous Bolus from Bag 05/13/24 0534)     Clinical Course as of 05/13/24 0550  Wed May 13, 2024  0345 Patient with extensive history including peripheral vascular disease, recent right fifth toe amputation, now presents with obvious postop infection and pulseless foot.  Patient also had recent vascular procedures and did have a palpable pulse in her foot on previous evaluations.  I have started IV antibiotics.  X-rays were ordered and labs.  Will consult vascular surgery [DW]  0505 Creatinine(!): 2.65 Acute kidney injury [DW]  0514 Discussed the case with on-call vascular surgeon Dr. Sheree.  He has reviewed the imaging and we discussed the case Patient will need transfer to [facility] for vascular and podiatry evaluation We will start heparin  for presumed vascular occlusion.  He request to defer CT angio imaging for now He  request admission to the hospitalist at [facility] [DW]  9449 Discussed the case with Dr. Adefeso for admission.  Patient will need to be transferred to [facility] for podiatry and vascular consultation [DW]    Clinical Course User Index [DW] Midge Golas, MD                                 Medical Decision Making Amount and/or Complexity of Data Reviewed Labs: ordered. Decision-making details documented in ED Course. Radiology: ordered.  Risk Prescription drug management. Decision regarding hospitalization.   This patient presents to the ED for concern of right foot pain, this involves an extensive number of treatment options, and is a complaint that carries with it a high risk of complications and morbidity.  The differential diagnosis includes but is not limited to muscle strain, cellulitis, abscess, necrotizing fasciitis, postoperative infection, occult fracture  Comorbidities that complicate the patient evaluation: Patients presentation is complicated by their history of peripheral vascular disease  Social Determinants of Health: Patients poor health literacy, previous tobacco use   increases the complexity of managing their presentation  Additional history obtained: Records reviewed previous admission documents and podiatry notes reviewed  Lab Tests: I Ordered, and personally interpreted labs.  The pertinent results include: Acute kidney injury  Imaging Studies ordered: I ordered imaging studies including X-ray foot x-ray  I independently visualized and interpreted imaging which showed no acute findings I agree with the radiologist interpretation  Cardiac Monitoring: The patient was maintained on a cardiac monitor.  I personally viewed and interpreted the cardiac monitor which showed an underlying rhythm of:  sinus rhythm  Medicines ordered and prescription drug management: I ordered medication including IV fentanyl  for pain Reevaluation of the patient after these medicines showed that the patient    improved  Critical Interventions:   IV antibiotics, admission  Consultations Obtained: I requested consultation with the admitting physician [facility] and consultant vascular, and discussed  findings as well as pertinent plan - they recommend: Admit to [facility]  Reevaluation: After the interventions noted above, I reevaluated the patient and found that they have :improved  Complexity of problems addressed: Patients presentation is most consistent with  acute presentation with potential threat to life or bodily function  Disposition: After consideration of the diagnostic results and the patients response to treatment,  I feel that the patent would benefit from admission  .        Final diagnoses:  Postoperative infection, unspecified type, initial encounter  AKI (acute kidney injury)  Vascular occlusion    ED Discharge Orders     None          Midge Golas, MD 05/13/24 [phone]

## 2024-05-13 NOTE — Progress Notes (Signed)
 PHARMACY - ANTICOAGULATION CONSULT NOTE  Pharmacy Consult for Heparin   Indication: vascular occlusion  Allergies[1]  Patient Measurements: Height: 5' 1 (154.9 cm) IBW/kg (Calculated) : 47.8  Vital Signs: Temp: 97.7 F (36.5 C) (12/17 0249) Temp Source: Oral (12/17 0249) BP: 174/63 (12/17 0500) Pulse Rate: 85 (12/17 0500)  Labs: Recent Labs    05/13/24 0328  HGB 11.4*  HCT 35.6*  PLT 291  LABPROT 13.2  INR 1.0  CREATININE 2.65*    CrCl cannot be calculated (Unknown ideal weight.).   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 Heparin  for rule out vascular occlusion. Labs above reviewed. PTA meds reviewed.   Goal of Therapy:  Heparin  level 0.3-0.7 units/ml Monitor platelets by anticoagulation protocol: Yes   Plan:  Heparin  2000 units BOLUS Start heparin  drip at 650 units/hr Heparin  level in 8 hours Daily CBC/Heparin  level Monitor for bleeding  Lynwood Mckusick, PharmD, BCPS Clinical Pharmacist Phone: [phone]       [1]  Allergies Allergen Reactions   Penicillins Anaphylaxis, Swelling, Rash and Other (See Comments)    Immediate rash,  facial/tongue/throat swelling, SOB or lightheadedness with hypotension

## 2024-05-13 NOTE — Progress Notes (Signed)
 Pharmacy Antibiotic Note  [name] is a 83 y.o. female admitted on 05/13/2024 with vascular occlusion and cellulitis as a complication.  Pharmacy has been consulted for vancomycin  and meropenem  dosing.  Patient currently afebrile, wbc normal at 8.9. Patient appears to be in AKI with scr of 2.6, will use traditional vancomycin  dosing. Initial antibiotics given in ED, plan on transfer to [facility] for vascular workup.   Plan: Vancomycin  500mg  IV every 48 hours.  Goal trough 10-15 mcg/mL. Meropenem  1g q24 hours Follow up cultures and renal function for adjustment in antibiotics  Height: 5' 1 (154.9 cm) IBW/kg (Calculated) : 47.8  Temp (24hrs), Avg:98.4 F (36.9 C), Min:97.7 F (36.5 C), Max:98.7 F (37.1 C)  Recent Labs  Lab 05/13/24 0320 05/13/24 0328  WBC  --  8.9  CREATININE  --  2.65*  LATICACIDVEN 1.1  --     CrCl cannot be calculated (Unknown ideal weight.).    Allergies[1]  Thank you for allowing pharmacy to be a part of this patients care.  Dempsey Blush PharmD., BCPS Clinical Pharmacist 05/13/2024 3:53 PM     [1]  Allergies Allergen Reactions   Penicillins Anaphylaxis, Swelling, Rash and Other (See Comments)    Immediate rash, facial/tongue/throat swelling, SOB or lightheadedness with hypotension

## 2024-05-13 NOTE — ED Notes (Addendum)
 Care link called to transport patient at this time.Spoke to [facility]. Nurse made aware.

## 2024-05-13 NOTE — ED Triage Notes (Addendum)
 Pt BIB from home. Per EMS pt stated right foot has been hurting since surgery 11/28 (right pinky toe amputated). Recently taken prescribed oxycodone . Bilaterally lower extremity swelling. Right foot has drainage, tenderness, smell, and the remainder of the toes are black. Pain is radiating from her right foot to her chest.

## 2024-05-13 NOTE — H&P (Signed)
 History and Physical    Patient: [name] FMW:[id number] DOB: 02-22-1941 DOA: 05/13/2024 DOS: the patient was seen and examined on 05/13/2024 PCP: Zollie Lowers, MD  Patient coming from: Home  Chief Complaint:  Chief Complaint  Patient presents with   Foot Pain   Chest Pain   HPI: [name] is a 83 y.o. female with medical history significant for  HTN, HLD, CAD, CKD #A, CHF, h/o stroke with mild residual right-sided weakness, gout. COPD/ongoing tobacco abuse, renal artery stenosis, peripheral artery disease (S/p aortobifemoral angiogram, right superficial femoral and popliteal artery stenting and mechanical thrombectomy -04/14/24 with Dr. Serene, patient also underwent right fifth toe amputation on 04/22/24 by Dr. Malvin), history of chronic diastolic dysfunction CHF, GERD and  RLS presents to the ED with increasing right lower extremity pain and gangrenous changes of the right foot. No fever  Or chills   No Nausea, Vomiting or Diarrhea - No frank chest pain, no increased dyspnea, no oxygen requirement  In the ED chest x-ray without acute cardiopulmonary findings -Right foot x-rays--IMPRESSION: 1. No acute findings. 2. Status post amputation of the fifth toe at the level of the metatarsophalangeal joint. 3. Mild degenerative spurring within the midfoot. 4. Moderate spurring involving the calcaneus at the insertion site of the Achilles tendon and the plantar aponeurosis.  Lactic acid is 1.1, bicarb is 28, anion gap is 13, WBCs 8.9 , hemoglobin 11.4 platelets 291 -Creatinine is 2.65 it was 1.46 on 04/15/2024, baseline usually between 1.3-1.4 -LFTs are not elevated, potassium is 3.5 sodium 139  - Exam in the ED reveals difficulty locating right-sided foot pulses even with a Doppler - EDP discussed case with on-call vascular surgeon Dr. Sheree who recommended IV heparin  and transfer to [facility] for further vascular surgery evaluation   Review of Systems: As  mentioned in the history of present illness. All other systems reviewed and are negative. 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   Ileus (HCC) 05/15/2021   Influenza A 07/15/2023   NSTEMI (non-ST elevated myocardial infarction) (HCC) 07/10/2023   Stroke (HCC)    2023   Unintentional weight loss 03/19/2022   Past Surgical History:  Procedure Laterality Date   ABDOMINAL AORTOGRAM W/LOWER EXTREMITY N/A 04/14/2024   Procedure: ABDOMINAL AORTOGRAM W/LOWER EXTREMITY;  Surgeon: Serene Gaile ORN, MD;  Location: [facility];  Service: Cardiovascular;  Laterality: N/A;   ABDOMINAL HYSTERECTOMY     AMPUTATION TOE Right 04/22/2024   Procedure: AMPUTATION FIFTH TOE OF RIGHT FOOT;  Surgeon: Malvin Marsa FALCON, DPM;  Location: [facility];  Service: Orthopedics/Podiatry;  Laterality: Right;  AMPUTATION FIFTH TOE OF RIGHT FOOT   ANTERIOR CERVICAL CORPECTOMY  03/07/2012   Procedure: ANTERIOR CERVICAL CORPECTOMY;  Surgeon: Victory Gens, MD;  Location: [facility];  Service: Neurosurgery;  Laterality: N/A;  Cervical six-seven, cervical seven-thoracic one Anterior cervical decompression/diskectomy/fusion, with Cervical seven Corpectomy, reconstruction using Allograft and Alphatec plate   CATARACT EXTRACTION  W/PHACO Left 04/12/2017   Procedure: CATARACT EXTRACTION PHACO AND INTRAOCULAR LENS PLACEMENT (IOC);  Surgeon: Harrie Agent, MD;  Location: [facility];  Service: Ophthalmology;  Laterality: Left;  CDE: 3.82    CATARACT EXTRACTION W/PHACO Right 05/03/2017   Procedure: CATARACT EXTRACTION PHACO AND INTRAOCULAR LENS PLACEMENT RIGHT EYE;  Surgeon: Harrie Agent, MD;  Location: [facility];  Service: Ophthalmology;  Laterality: Right;  CDE: 3.89   LEFT HEART CATH AND CORONARY ANGIOGRAPHY N/A 07/11/2023   Procedure: LEFT HEART CATH AND CORONARY ANGIOGRAPHY;  Surgeon: Elmira Newman PARAS, MD;  Location: [facility];  Service: Cardiovascular;  Laterality: N/A;   LOWER EXTREMITY ANGIOGRAPHY N/A 04/14/2024   Procedure: Lower Extremity Angiography;  Surgeon: Serene Gaile ORN, MD;  Location: [facility];  Service: Cardiovascular;  Laterality: N/A;   LOWER EXTREMITY INTERVENTION  04/14/2024   Procedure: LOWER EXTREMITY INTERVENTION;  Surgeon: Serene Gaile ORN, MD;  Location: [facility];  Service: Cardiovascular;;   MULTIPLE TOOTH EXTRACTIONS     TONSILLECTOMY     Social History:  reports that she quit smoking about 2 years ago. Her smoking use included cigarettes. She started smoking about 64 years ago. She has a 30.9 pack-year smoking history. She has been exposed to tobacco smoke. She has never used smokeless tobacco. She reports that she does not currently use alcohol. She reports that she does not use drugs.  Allergies[1]  Family History  Adopted: Yes  Problem Relation Age of Onset   Bipolar disorder Daughter    Heart disease Daughter    Asthma Daughter    Bipolar disorder Son    Bipolar disorder Daughter    Bipolar disorder Daughter    Bipolar disorder Daughter    Hypertension Daughter    Heart disease Daughter    Drug abuse Daughter        OD    Prior to Admission medications  Medication Sig Start Date End Date Taking? Authorizing Provider  acetaminophen  (TYLENOL ) 500 MG tablet Take 2 tablets (1,000 mg total) by mouth 3 (three) times daily. 10/30/22  Yes Zollie Lowers, MD  albuterol  (VENTOLIN  HFA) 108 (90 Base) MCG/ACT inhaler Inhale 2 puffs into the lungs every 4 (four) hours  as needed for wheezing or shortness of breath. 03/13/24  Yes Lashaye Fisk, MD  alendronate  (FOSAMAX ) 70 MG tablet Take 1 tablet (70 mg total) by mouth every 7 (seven) days. Take with a full glass of water  on an empty stomach. 02/25/24  Yes StacksLowers, MD  amLODipine  (NORVASC ) 10 MG tablet Take 1 tablet (10 mg total) by mouth daily. To lower blood pressure. 04/27/24  Yes Vannie Reche RAMAN, NP  aspirin  EC 81 MG tablet Take 1 tablet (81 mg total) by mouth daily. Swallow whole. 04/17/24  Yes Dahal, Chapman, MD  atorvastatin  (LIPITOR ) 80 MG tablet Take 1 tablet (80 mg total) by mouth daily. To lower cholesterol and protect leg stents. 04/27/24  Yes Vannie Reche RAMAN, NP  clopidogrel  (PLAVIX ) 75 MG tablet Take 1 tablet (75 mg total) by mouth daily. To protect your leg stents. 04/27/24  Yes Vannie Reche RAMAN, NP  fluticasone  furoate-vilanterol (BREO ELLIPTA ) 200-25 MCG/ACT AEPB Inhale 1 puff into the lungs daily. 03/13/24  Yes Eulogio Requena, MD  furosemide  (LASIX ) 20 MG tablet TAKE 1 TABLET (20 MG TOTAL) BY MOUTH DAILY. FOR SWELLING Patient taking differently: Take 20 mg by mouth as needed for fluid. For swelling 12/09/23  Yes Raford Riggs, MD  isosorbide  mononitrate (IMDUR ) 60 MG 24  hr tablet Take 1 tablet (60 mg total) by mouth daily. To lower blood pressure. 04/27/24  Yes Vannie Reche RAMAN, NP  pantoprazole  (PROTONIX ) 40 MG tablet Take 1 tablet (40 mg total) by mouth daily. Patient taking differently: Take 40 mg by mouth as needed. 03/13/24  Yes Nahla Lukin, MD  potassium chloride  SA (KLOR-CON  M) 20 MEQ tablet Take 1 tablet (20 mEq total) by mouth daily. For potassium replacement/ supplement Patient taking differently: Take 20 mEq by mouth as needed. For potassium replacement/ supplement 12/16/23  Yes Stacks, Butler, MD  pregabalin  (LYRICA ) 300 MG capsule Take 1 capsule (300 mg total) by mouth at bedtime. To reduce back pain 02/25/24 02/19/25 Yes Stacks, Butler, MD  rOPINIRole  (REQUIP ) 1 MG  tablet TAKE 1 TABLET (1 MG TOTAL) BY MOUTH AT BEDTIME. FOR LEG CRAMPS 03/10/24  Yes Zollie Butler, MD    Physical Exam: Vitals:   05/13/24 1000 05/13/24 1227 05/13/24 1300 05/13/24 1339  BP: (!) 184/71 (!) 168/77 (!) 172/63   Pulse: 84 82    Resp: 16 17 16    Temp:    98.7 F (37.1 C)  TempSrc:    Oral  SpO2: 94% 93%    Height:        Physical Exam Gen:- Awake Alert, in no acute distress  HEENT:- [place].AT, No sclera icterus Neck-Supple Neck,No JVD,.  Lungs-  CTAB , fair air movement bilaterally  CV- S1, S2 normal, RRR Abd-  +ve B.Sounds, Abd Soft, No tenderness,    Psych-affect is appropriate, oriented x3 Neuro-very subtle residual right-sided deficits from prior stroke, otherwise No new focal deficits, no tremors -Extremity/Skin:- -   Media Information   Document Information  Photographic Image: Photos    05/13/2024 03:24  Attached To:  Hospital Encounter on 05/13/24  Source Information  Midge Golas, MD  [facility]     Media Information   Document Information  Photographic Image: Photos  Rt foot  05/13/2024 14:24  Attached To:  Hospital Encounter on 05/13/24  Source Information  Pearlean Manus, MD  [facility]     Data Reviewed:  In the ED chest x-ray without acute cardiopulmonary findings -Right foot x-rays--IMPRESSION: 1. No acute findings. 2. Status post amputation of the fifth toe at the level of the metatarsophalangeal joint. 3. Mild degenerative spurring within the midfoot. 4. Moderate spurring involving the calcaneus at the insertion site of the Achilles tendon and the plantar aponeurosis.  Lactic acid is 1.1, bicarb is 28, anion gap is 13, WBCs 8.9 , hemoglobin 11.4 platelets 291 -Creatinine is 2.65 it was 1.46 on 04/15/2024, baseline usually between 1.3-1.4 -LFTs are not elevated, potassium is 3.5 sodium 139  - Assessment and Plan:  1)PAD---S/p aortobifemoral angiogram, right superficial femoral and popliteal  artery stenting and mechanical thrombectomy -04/14/24 with Dr. Serene, patient also underwent right fifth toe amputation on 04/22/24 by Dr. Malvin) - Now with concerns about possible dry gangrene of the right foot -Exam in the ED reveals difficulty locating right-sided foot pulses even with a Doppler - EDP discussed case with on-call vascular surgeon Dr. Sheree who recommended IV heparin  and transfer to [facility] for further vascular surgery evaluation -- I reached out to Dr. Sheree who is awaiting arrival of patient to [facility] later today - Continue IV heparin   - Continue aspirin , Plavix  and atorvastatin  - Vanco and meropenem  for possible secondary infection - Lactic acid is 1.1, bicarb is 28, anion gap is 13,  - Patient remains afebrile  2)AKI----acute kidney injury  on CKD stage - 3A - Creatinine is 2.65 it was 1.46 on 04/15/2024, - baseline usually between 1.3-1.4 --Gentle hydration with IV fluids especially given the patient will probably need contrast vascular study  --renally adjust medications, avoid nephrotoxic agents / dehydration  / hypotension  3)h/o stroke with mild residual right-sided weakness, - Continue aspirin , Plavix  and atorvastatin  mostly for PAD  4)HTN--continue amlodipine , as well as isosorbide  - May use IV hydralazine  as needed elevated BP  5)COPD/ongoing tobacco abuse--no acute COPD extubation at this time - Continue bronchodilators Smoking cessation advised  6)GERD--continue Protonix   7)HFpEF/chronic diastolic dysfunction CHF - Echo from 07/14/2023 with EF of 55 to 60% with grade 1 diastolic dysfunction -- No acute exacerbation at this time - Be judicious with IV fluid   Advance Care Planning:   Code Status: Full Code   Consults: Vascular surgery  Family Communication: Patient is coherent, no family at bedside  Severity of Illness: The appropriate patient status for this patient is INPATIENT. Inpatient status is judged to be reasonable and  necessary in order to provide the required intensity of service to ensure the patient's safety. The patient's presenting symptoms, physical exam findings, and initial radiographic and laboratory data in the context of their chronic comorbidities is felt to place them at high risk for further clinical deterioration. Furthermore, it is not anticipated that the patient will be medically stable for discharge from the hospital within 2 midnights of admission.   * I certify that at the point of admission it is my clinical judgment that the patient will require inpatient hospital care spanning beyond 2 midnights from the point of admission due to high intensity of service, high risk for further deterioration and high frequency of surveillance required.*  Author: Rendall Carwin, MD 05/13/2024 3:44 PM  For on call review [email or web address].      [1]  Allergies Allergen Reactions   Penicillins Anaphylaxis, Swelling, Rash and Other (See Comments)    Immediate rash, facial/tongue/throat swelling, SOB or lightheadedness with hypotension

## 2024-05-13 NOTE — Consult Note (Addendum)
 [facility] [place]  Patient name: [name] MRN: [id number] DOB: May 17, 1941 Sex: female   REQUESTING PROVIDER:    hospitalists   REASON FOR CONSULT:    Foot wound  HISTORY OF PRESENT ILLNESS:   [name] is a 83 y.o. female, who we evaluated about a month ago for bluish discoloration of the right fifth toe with a nonhealing wound.  CT scan showed occlusion of the superficial femoral and popliteal artery.  She underwent angiography on 04/14/2024 and had mechanical thrombectomy of the right superficial femoral and popliteal artery with subsequent stent placement.  She had palpable dorsalis pedis pulse after the procedure.  She underwent right fifth toe amputation by Dr. Malvin on 04/22/2024.  She came to the emergency department with increasing right leg pain and gangrenous changes to the right foot.  There was difficulty getting pulses in the right in the ER and so she was started on heparin  and transferred to [facility].  Patient has a history of chronic diastolic heart failure.  She is a former smoker.  She has had NSTEMI in the past.  She has stage IIIa chronic renal insufficiency.  She has a history of stroke with mild right-sided weakness.  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   Ileus (HCC) 05/15/2021   Influenza A 07/15/2023   NSTEMI (non-ST elevated myocardial infarction) (HCC) 07/10/2023    Stroke (HCC)    2023   Unintentional weight loss 03/19/2022     FAMILY HISTORY   Family History  Adopted: Yes  Problem Relation Age of Onset   Bipolar disorder Daughter    Heart disease Daughter    Asthma Daughter    Bipolar disorder Son    Bipolar disorder Daughter    Bipolar disorder Daughter    Bipolar disorder Daughter    Hypertension Daughter    Heart disease Daughter    Drug abuse Daughter        OD    SOCIAL HISTORY:   Social History   Socioeconomic History   Marital status: Widowed    Spouse name: Not on file   Number of children: 5   Years of education: 75   Highest education level: 12th grade  Occupational History   Occupation: retired    Comment: CNA  Tobacco Use   Smoking status: Former    Current packs/day: 0.00    Average packs/day: 0.5 packs/day for 61.8 years (30.9 ttl pk-yrs)    Types: Cigarettes    Start date: 98    Quit date: 03/24/2022    Years since quitting: 2.1    Passive exposure: Past   Smokeless tobacco: Never  Vaping Use   Vaping status: Never Used  Substance and Sexual Activity   Alcohol use: Not Currently   Drug use: No   Sexual activity: Not Currently    Partners: Male    Birth control/protection: Surgical  Other Topics Concern   Not on file  Social History Narrative   5 children living, 1 deceased.    Children live out of state.   Social Drivers of Health   Tobacco Use: Medium Risk (05/13/2024)   Patient History    Smoking Tobacco Use: Former    Smokeless Tobacco Use: Never    Passive Exposure: Past  Physicist, Medical Strain: Medium Risk (03/20/2024)   Overall Financial Resource Strain (CARDIA)    Difficulty of Paying Living Expenses: Somewhat hard  Food Insecurity: No Food Insecurity (04/20/2024)   Epic    Worried About Programme Researcher, Broadcasting/film/video in the Last Year: Never true    Ran Out of Food in the Last Year: Never true  Transportation Needs: No Transportation Needs (04/20/2024)   Epic    Lack of  Transportation (Medical): No    Lack of Transportation (Non-Medical): No  Recent Concern: Transportation Needs - Unmet Transportation Needs (04/13/2024)   Epic    Lack of Transportation (Medical): Yes    Lack of Transportation (Non-Medical): Yes  Physical Activity: Insufficiently Active (11/14/2023)   Exercise Vital Sign    Days of Exercise per Week: 7 days    Minutes of Exercise per Session: 10 min  Stress: Stress Concern Present (11/14/2023)   [employer] of Occupational Health - Occupational Stress Questionnaire    Feeling of Stress: Rather much  Social Connections: Socially Integrated (04/13/2024)   Social Connection and Isolation Panel    Frequency of Communication with Friends and Family: More than three times a week    Frequency of Social Gatherings with Friends and Family: More than three times a week    Attends Religious Services: More than 4 times per year    Active Member of Clubs or Organizations: Yes    Attends Banker Meetings: More than 4 times per year    Marital Status: Living with partner  Intimate Partner Violence: Not At Risk (04/20/2024)   Epic    Fear of Current or Ex-Partner: No    Emotionally Abused: No    Physically Abused: No    Sexually Abused: No  Depression (PHQ2-9): High Risk (04/21/2024)   Depression (PHQ2-9)    PHQ-2 Score: 18  Alcohol Screen: Low Risk (11/14/2023)   Alcohol Screen    Last Alcohol Screening Score (AUDIT): 0  Housing: Unknown (04/20/2024)   Epic    Unable to Pay for Housing in the Last Year: No    Number of Times Moved in the Last Year: Not on file    Homeless in the Last Year: No  Utilities: Not At Risk (04/20/2024)   Epic    Threatened with loss of utilities: No  Health Literacy: Adequate Health Literacy (11/14/2023)   B1300 Health Literacy    Frequency of need for help with medical instructions: Never    ALLERGIES:    Allergies[1]  CURRENT MEDICATIONS:    Current Facility-Administered Medications   Medication Dose Route Frequency Provider Last Rate Last Admin   0.9 %  sodium chloride  infusion   Intravenous PRN Emokpae, Courage, MD       0.9 %  sodium chloride  infusion   Intravenous Continuous Emokpae, Courage, MD       acetaminophen  (TYLENOL ) tablet 650 mg  650 mg Oral Q6H PRN Emokpae, Courage, MD       Or   acetaminophen  (TYLENOL ) suppository 650 mg  650 mg Rectal Q6H PRN Emokpae, Courage, MD       albuterol  (PROVENTIL ) (2.5  MG/3ML) 0.083% nebulizer solution 2.5 mg  2.5 mg Nebulization Q2H PRN Emokpae, Courage, MD       amLODipine  (NORVASC ) tablet 10 mg  10 mg Oral Daily Emokpae, Courage, MD       [START ON 05/14/2024] aspirin  EC tablet 81 mg  81 mg Oral Q breakfast Emokpae, Courage, MD       atorvastatin  (LIPITOR ) tablet 80 mg  80 mg Oral Daily Emokpae, Courage, MD       bisacodyl  (DULCOLAX) suppository 10 mg  10 mg Rectal Daily PRN Emokpae, Courage, MD       clopidogrel  (PLAVIX ) tablet 75 mg  75 mg Oral Daily Emokpae, Courage, MD       fentaNYL  (SUBLIMAZE ) injection 100 mcg  100 mcg Intravenous Q1H PRN Pearlean, Courage, MD   100 mcg at 05/13/24 1752   [START ON 05/14/2024] fluticasone  furoate-vilanterol (BREO ELLIPTA ) 200-25 MCG/ACT 1 puff  1 puff Inhalation Daily Emokpae, Courage, MD       heparin  ADULT infusion 100 units/mL (25000 units/250mL)  800 Units/hr Intravenous Continuous Emokpae, Courage, MD 8 mL/hr at 05/13/24 1543 800 Units/hr at 05/13/24 1543   hydrALAZINE  (APRESOLINE ) injection 10 mg  10 mg Intravenous Q6H PRN Pearlean Manus, MD   10 mg at 05/13/24 1751   isosorbide  mononitrate (IMDUR ) 24 hr tablet 60 mg  60 mg Oral Daily Emokpae, Courage, MD       meropenem  (MERREM ) 1 g in sodium chloride  0.9 % 100 mL IVPB  1 g Intravenous Q24H Tanda Dempsey SAUNDERS, RPH       ondansetron  (ZOFRAN ) tablet 4 mg  4 mg Oral Q6H PRN Emokpae, Courage, MD       Or   ondansetron  (ZOFRAN ) injection 4 mg  4 mg Intravenous Q6H PRN Emokpae, Courage, MD       oxyCODONE  (Oxy IR/ROXICODONE ) immediate  release tablet 5 mg  5 mg Oral Q4H PRN Emokpae, Courage, MD       pantoprazole  (PROTONIX ) EC tablet 40 mg  40 mg Oral Daily Emokpae, Courage, MD       polyethylene glycol (MIRALAX  / GLYCOLAX ) packet 17 g  17 g Oral Daily PRN Emokpae, Courage, MD       pregabalin  (LYRICA ) capsule 300 mg  300 mg Oral QHS Emokpae, Courage, MD       rOPINIRole  (REQUIP ) tablet 1 mg  1 mg Oral QHS Emokpae, Courage, MD       sodium chloride  flush (NS) 0.9 % injection 3 mL  3 mL Intravenous Q12H Emokpae, Courage, MD       sodium chloride  flush (NS) 0.9 % injection 3 mL  3 mL Intravenous Q12H Emokpae, Courage, MD       sodium chloride  flush (NS) 0.9 % injection 3 mL  3 mL Intravenous PRN Emokpae, Courage, MD       traZODone  (DESYREL ) tablet 50 mg  50 mg Oral QHS PRN Emokpae, Courage, MD       [START ON 05/14/2024] vancomycin  (VANCOREADY) IVPB 500 mg/100 mL  500 mg Intravenous Q48H Tanda Dempsey SAUNDERS, RPH        REVIEW OF SYSTEMS:   [X]  denotes positive finding, [ ]  denotes negative finding Cardiac  Comments:  Chest pain or chest pressure:    Shortness of breath upon exertion:    Short of breath when lying flat:    Irregular heart rhythm:        Vascular    Pain in calf, thigh, or hip brought on by ambulation:  Pain in feet at night that wakes you up from your sleep:     Blood clot in your veins:    Leg swelling:         Pulmonary    Oxygen at home:    Productive cough:     Wheezing:         Neurologic    Sudden weakness in arms or legs:     Sudden numbness in arms or legs:     Sudden onset of difficulty speaking or slurred speech:    Temporary loss of vision in one eye:     Problems with dizziness:         Gastrointestinal    Blood in stool:      Vomited blood:         Genitourinary    Burning when urinating:     Blood in urine:        Psychiatric    Major depression:         Hematologic    Bleeding problems:    Problems with blood clotting too easily:        Skin    Rashes or ulcers:         Constitutional    Fever or chills:     PHYSICAL EXAM:   Vitals:   05/13/24 1800 05/13/24 1817 05/13/24 1907 05/13/24 1922  BP: (!) 136/52  (!) 154/81 (!) 154/81  Pulse: 87   98  Resp: 20  18 17   Temp:  98.4 F (36.9 C) 98.3 F (36.8 C) 98.4 F (36.9 C)  TempSrc:  Oral Oral Oral  SpO2: 92%  98% 95%  Weight:      Height:        GENERAL: The patient is a well-nourished female, in no acute distress. The vital signs are documented above. CARDIAC: There is a regular rate and rhythm.  VASCULAR: Monophasic popliteal signal in the right.  Difficult to find pedal signals.  Palpable femoral pulses PULMONARY: Nonlabored respira MUSCULOSKELETAL: There are no major deformities or cyanosis. NEUROLOGIC: No focal weakness or paresthesias are detected. SKIN: There are no ulcers or rashes noted. PSYCHIATRIC: The patient has a normal affect.  STUDIES:   Right foot x-ray:  1. No acute findings. 2. Status post amputation of the fifth toe at the level of the metatarsophalangeal joint. 3. Mild degenerative spurring within the midfoot. 4. Moderate spurring involving the calcaneus at the insertion site of the Achilles tendon and the plantar aponeurosis. ASSESSMENT and PLAN   Ischemic changes to right foot: The patient has undergone revascularization of the right foot and subsequent toe amputation.  Since that time, she has been complaining of right foot pain.  She has developed deterioration of the appearance of her foot prompting her to come to the emergency department.  On exam she has palpable femoral pulses.  There is a monophasic popliteal signal and difficult to auscultate pedal signals.  I am concerned that her intervention has occluded.  We talked about repeating her angiography to see if she has any options to improve her blood flow.  This would be from a left femoral approach.  We will get her onto the schedule Thursday or Friday.  I would continue IV heparin .  We did discuss that  this is a limb threatening situation   Malvina New, IV, MD, FACS [facility] [phone] Pager [phone]      [1]  Allergies Allergen Reactions  Penicillins Anaphylaxis, Swelling, Rash and Other (See Comments)    Immediate rash, facial/tongue/throat swelling, SOB or lightheadedness with hypotension

## 2024-05-13 NOTE — Progress Notes (Signed)
 PHARMACY - ANTICOAGULATION CONSULT NOTE  Pharmacy Consult for Heparin   Indication: vascular occlusion  Allergies[1]  Patient Measurements: Height: 5' 1 (154.9 cm) IBW/kg (Calculated) : 47.8  Vital Signs: Temp: 98.7 F (37.1 C) (12/17 1339) Temp Source: Oral (12/17 1339) BP: 172/63 (12/17 1300) Pulse Rate: 82 (12/17 1227)  Labs: Recent Labs    05/13/24 0328 05/13/24 1330  HGB 11.4*  --   HCT 35.6*  --   PLT 291  --   LABPROT 13.2  --   INR 1.0  --   HEPARINUNFRC  --  0.15*  CREATININE 2.65*  --     CrCl cannot be calculated (Unknown ideal weight.).   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 Heparin  for rule out vascular occlusion. Labs above reviewed. PTA meds reviewed.   HL 0.15- subtherapeutic CBC WNL  Goal of Therapy:  Heparin  level 0.3-0.7 units/ml Monitor platelets by anticoagulation protocol: Yes   Plan:  Heparin  1000 units BOLUS Increase heparin  infusion to 800 units/hr Heparin  level in 8 hours Daily CBC/Heparin  level Monitor for bleeding  Elspeth Sour, PharmD Clinical Pharmacist 05/13/2024 2:55 PM         [1]   Allergies Allergen Reactions   Penicillins Anaphylaxis, Swelling, Rash and Other (See Comments)    Immediate rash, facial/tongue/throat swelling, SOB or lightheadedness with hypotension

## 2024-05-14 ENCOUNTER — Encounter (HOSPITAL_COMMUNITY)
Admission: EM | Disposition: A | Discharge status: Skilled Nursing Facility | Payer: Self-pay | Source: Home / Self Care | Attending: Family Medicine

## 2024-05-14 DIAGNOSIS — T82856A Stenosis of peripheral vascular stent, initial encounter: Secondary | ICD-10-CM | POA: Diagnosis not present

## 2024-05-14 DIAGNOSIS — I7092 Chronic total occlusion of artery of the extremities: Secondary | ICD-10-CM

## 2024-05-14 DIAGNOSIS — I70261 Atherosclerosis of native arteries of extremities with gangrene, right leg: Secondary | ICD-10-CM

## 2024-05-14 DIAGNOSIS — T8140XA Infection following a procedure, unspecified, initial encounter: Secondary | ICD-10-CM | POA: Diagnosis not present

## 2024-05-14 HISTORY — PX: LOWER EXTREMITY ANGIOGRAPHY: CATH118251

## 2024-05-14 LAB — CBC
HCT: 27.9 % — ABNORMAL LOW (ref 36.0–46.0)
HCT: 29.8 % — ABNORMAL LOW (ref 36.0–46.0)
Hemoglobin: 9.1 g/dL — ABNORMAL LOW (ref 12.0–15.0)
Hemoglobin: 9.4 g/dL — ABNORMAL LOW (ref 12.0–15.0)
MCH: 29.7 pg (ref 26.0–34.0)
MCH: 29.3 pg (ref 26.0–34.0)
MCHC: 32.6 g/dL (ref 30.0–36.0)
MCHC: 31.5 g/dL (ref 30.0–36.0)
MCV: 91.2 fL (ref 80.0–100.0)
MCV: 92.8 fL (ref 80.0–100.0)
Platelets: 157 K/uL (ref 150–400)
Platelets: 171 K/uL (ref 150–400)
RBC: 3.06 MIL/uL — ABNORMAL LOW (ref 3.87–5.11)
RBC: 3.21 MIL/uL — ABNORMAL LOW (ref 3.87–5.11)
RDW: 13.6 % (ref 11.5–15.5)
RDW: 13.7 % (ref 11.5–15.5)
WBC: 17.1 K/uL — ABNORMAL HIGH (ref 4.0–10.5)
WBC: 15.7 K/uL — ABNORMAL HIGH (ref 4.0–10.5)
nRBC: 0 % (ref 0.0–0.2)
nRBC: 0 % (ref 0.0–0.2)

## 2024-05-14 LAB — COMPREHENSIVE METABOLIC PANEL WITH GFR
ALT: <5 U/L (ref 0–44)
AST: 13 U/L — ABNORMAL LOW (ref 15–41)
Albumin: 2.7 g/dL — ABNORMAL LOW (ref 3.5–5.0)
Alkaline Phosphatase: 47 U/L (ref 38–126)
Anion gap: 13 (ref 5–15)
BUN: 33 mg/dL — ABNORMAL HIGH (ref 8–23)
CO2: 22 mmol/L (ref 22–32)
Calcium: 8.1 mg/dL — ABNORMAL LOW (ref 8.9–10.3)
Chloride: 108 mmol/L (ref 98–111)
Creatinine, Ser: 1.42 mg/dL — ABNORMAL HIGH (ref 0.44–1.00)
GFR, Estimated: 37 mL/min — ABNORMAL LOW (ref >60)
Glucose, Bld: 80 mg/dL (ref 70–99)
Potassium: 3.5 mmol/L (ref 3.5–5.1)
Sodium: 143 mmol/L (ref 135–145)
Total Bilirubin: 0.6 mg/dL (ref 0.0–1.2)
Total Protein: 5.4 g/dL — ABNORMAL LOW (ref 6.5–8.1)

## 2024-05-14 LAB — HEPARIN LEVEL (UNFRACTIONATED)
Heparin Unfractionated: 0.26 [IU]/mL — ABNORMAL LOW (ref 0.30–0.70)
Heparin Unfractionated: 0.3 [IU]/mL (ref 0.30–0.70)
Heparin Unfractionated: 0.46 [IU]/mL (ref 0.30–0.70)

## 2024-05-14 LAB — MRSA NEXT GEN BY PCR, NASAL: MRSA by PCR Next Gen: NOT DETECTED

## 2024-05-14 LAB — FIBRINOGEN: Fibrinogen: 439 mg/dL (ref 210–475)

## 2024-05-14 MED ORDER — SODIUM CHLORIDE 0.9 % IV SOLN
500.0000 mg | Freq: Two times a day (BID) | INTRAVENOUS | Status: DC
  Administered 2024-05-14 – 2024-05-15 (×3): 500 mg via INTRAVENOUS
  Filled 2024-05-14 (×4): qty 10

## 2024-05-14 MED ORDER — SODIUM CHLORIDE 0.9% FLUSH
3.0000 mL | Freq: Two times a day (BID) | INTRAVENOUS | Status: DC
  Administered 2024-05-14 – 2024-05-15 (×2): 3 mL via INTRAVENOUS

## 2024-05-14 MED ORDER — LIDOCAINE HCL (PF) 1 % IJ SOLN
INTRAMUSCULAR | Status: DC | PRN
  Administered 2024-05-14: 14:00:00 15 mL

## 2024-05-14 MED ORDER — HEPARIN SODIUM (PORCINE) 1000 UNIT/ML IJ SOLN
INTRAMUSCULAR | Status: DC | PRN
  Administered 2024-05-14: 14:00:00 3000 [IU] via INTRAVENOUS

## 2024-05-14 MED ORDER — LIDOCAINE HCL (PF) 1 % IJ SOLN
INTRAMUSCULAR | Status: AC
  Filled 2024-05-14: qty 30

## 2024-05-14 MED ORDER — SODIUM CHLORIDE 0.9% FLUSH: 3.0000 mL | INTRAVENOUS | Status: DC | PRN

## 2024-05-14 MED ORDER — MORPHINE SULFATE (PF) 2 MG/ML IV SOLN: 5.0000 mg | INTRAVENOUS | Status: DC | PRN

## 2024-05-14 MED ORDER — VANCOMYCIN HCL IN DEXTROSE 1-5 GM/200ML-% IV SOLN: 1000.0000 mg | INTRAVENOUS | Status: DC

## 2024-05-14 MED ORDER — SODIUM CHLORIDE 0.9 % IV SOLN
1.0000 mg/h | INTRAVENOUS | Status: DC
  Administered 2024-05-14: 15:00:00 1 mg/h
  Filled 2024-05-14 (×2): qty 24

## 2024-05-14 MED ORDER — HEPARIN (PORCINE) 25000 UT/250ML-% IV SOLN
850.0000 [IU]/h | INTRAVENOUS | Status: DC
  Administered 2024-05-14: 09:00:00 850 [IU]/h via INTRAVENOUS
  Filled 2024-05-14: qty 250

## 2024-05-14 MED ORDER — HEPARIN (PORCINE) 25000 UT/250ML-% IV SOLN
850.0000 [IU]/h | INTRAVENOUS | Status: DC
  Administered 2024-05-14: 14:00:00 800 [IU]/h via INTRAVENOUS
  Filled 2024-05-14: qty 250

## 2024-05-14 MED ORDER — HEPARIN BOLUS VIA INFUSION
500.0000 [IU] | Freq: Once | INTRAVENOUS | Status: AC
  Administered 2024-05-14: 09:00:00 500 [IU] via INTRAVENOUS
  Filled 2024-05-14: qty 500

## 2024-05-14 MED ORDER — HEPARIN (PORCINE) IN NACL 1000-0.9 UT/500ML-% IV SOLN
INTRAVENOUS | Status: DC | PRN
  Administered 2024-05-14: 13:00:00 500 mL

## 2024-05-14 MED ORDER — IODIXANOL 320 MG/ML IV SOLN
INTRAVENOUS | Status: DC | PRN
  Administered 2024-05-14: 15:00:00 45 mL

## 2024-05-14 MED ORDER — CHLORHEXIDINE GLUCONATE CLOTH 2 % EX PADS
6.0000 | MEDICATED_PAD | Freq: Every day | CUTANEOUS | Status: DC
  Administered 2024-05-14 – 2024-05-15 (×2): 6 via TOPICAL

## 2024-05-14 MED ORDER — SODIUM CHLORIDE 0.9 % IV SOLN: 250.0000 mL | INTRAVENOUS | Status: AC | PRN

## 2024-05-14 NOTE — Consult Note (Addendum)
 WOC Nurse Consult Note: Reason for Consult: Requested to assess wounds on R foot. Wound type: Vascular. Previous amputation 11/26. Suture in place. Pts has history of heart failure, Stage IIIa chronic renal insufficiency, and previous amputation.  Pressure Injury POA: NA. Measurement: 13 cm x 5 cm Wound bed: 100% red, purple skin close to the amputation area. Drainage (amount, consistency, odor) Minimum amount, serous. Periwound: Dark discoloration, blister ruptured, peeling skin, toes are cold and purple. Dressing procedure/placement/frequency: Cleanse with Vashe# [id number]. Not rinse and allow to dry. Apply a single layer of Xeroform or petrolatum, top with non adherent gauze. Wrap with Kerlix and ACE wrap. Change daily.    Addendum: following by vascular surgeon.  WOC team will not plan to follow further. Please reconsult if further assistance is needed. Thank-you,  Lela Holm MSN, RN, CWCN, CNS.  (Phone [phone])

## 2024-05-14 NOTE — Progress Notes (Signed)
 PHARMACY - ANTICOAGULATION/Antibiotic CONSULT NOTE  Pharmacy Consult for Heparin   Indication: vascular occlusion  Allergies[1]  Patient Measurements: Height: 5' 1 (154.9 cm) Weight: 50 kg (110 lb 3.7 oz) (from 04/27/24 office visit) IBW/kg (Calculated) : 47.8  Vital Signs: Temp: 98.7 F (37.1 C) (12/18 0739) BP: 137/58 (12/18 0739) Pulse Rate: 96 (12/18 0739)  Labs: Recent Labs    05/13/24 0328 05/13/24 1330 05/14/24 0445 05/14/24 0449  HGB 11.4*  --  9.1*  --   HCT 35.6*  --  27.9*  --   PLT 291  --  157  --   LABPROT 13.2  --   --   --   INR 1.0  --   --   --   HEPARINUNFRC  --  0.15*  --  0.26*  CREATININE 2.65*  --  1.42*  --     Estimated Creatinine Clearance: 23.4 mL/min (A) (by C-G formula based on SCr of 1.42 mg/dL (H)).   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 Heparin  for rule out vascular occlusion. Labs above reviewed. PTA meds reviewed.   12/18 AM: Heparin  level subtherapeutic at 0.26 with heparin  running at 800 units/hour. Hgb dip 11.4>>9.1 with all cell lines down and NZ running at 83 mL/h in context of AKI- likely dilutional. No signs of  bleeding or issues with the heparin  infusion noted.    Goal of Therapy:  Heparin  level 0.3-0.7 units/ml Monitor platelets by anticoagulation protocol: Yes   Plan:  Heparin  bolus via infusion 500 units x1  Increase heparin  infusion to 850 units/hr Heparin  level in 8 hours Daily CBC/Heparin  level Monitor for bleeding  PHARMACY ANTIBIOTIC CONSULT NOTE   [name] a 83 y.o. female p/w ischemic changes to the right foot following recent revascularization and toe amputation 04/22/24 .  Pharmacy has been consulted for Vancomycin  dosing. Patient received 1 g Vancomycin  load (~20 mg/kg) on 12/17. Given improvement in renal function will empirically dose adjust vancomycin  using vancomycin  nomogram for cellulitis.   Scr 2.65>>1.42 (05/14/2024), WBC 17.1 (05/14/2024)  Estimated Creatinine Clearance: 23.4 mL/min (A) (by C-G formula based on SCr of 1.42 mg/dL (H)).  Plan: Meropenem  1g IV Q24H>>500 mg IV Q12H  Noted plans for revascularization per [facility] 12/18 or 12/19, F/U LOT, renal function   Allergies:  Allergies[2]  Filed Weights   05/13/24 1500  Weight: 50 kg (110 lb 3.7 oz)       Latest Ref Rng & Units 05/14/2024    4:45 AM 05/13/2024    3:28 AM 04/16/2024    3:59 AM  CBC  WBC 4.0 - 10.5 K/uL  17.1  8.9  6.0   Hemoglobin 12.0 - 15.0 g/dL 9.1  88.5  9.3   Hematocrit 36.0 - 46.0 % 27.9  35.6  28.7   Platelets 150 - 400 K/uL 157  291  170     Antibiotics Given (last 72 hours)     Date/Time Action Medication Dose Rate   05/13/24 0347 New Bag/Given   meropenem  (MERREM ) 1 g in sodium chloride  0.9 % 100 mL IVPB 1 g 200 mL/hr   05/13/24 0424 New Bag/Given   vancomycin  (VANCOCIN ) IVPB 1000 mg/200 mL premix 1,000 mg 200 mL/hr   05/13/24 2152 New Bag/Given   meropenem  (MERREM ) 1 g in sodium chloride  0.9 % 100 mL IVPB 1 g 200 mL/hr       Vanco 12/17 >12/18 Merrem  12/17>  12/17 Bcx: ngtd    Massie Fila, PharmD Clinical Pharmacist  05/14/2024 8:00 AM             [1]  Allergies Allergen Reactions   Penicillins Anaphylaxis, Swelling, Rash and Other (See Comments)    Immediate rash, facial/tongue/throat swelling, SOB or lightheadedness with hypotension  [2]  Allergies Allergen Reactions   Penicillins Anaphylaxis, Swelling, Rash and Other (See Comments)    Immediate rash, facial/tongue/throat swelling, SOB or lightheadedness with hypotension

## 2024-05-14 NOTE — Progress Notes (Signed)
°  Progress Note   Patient: [name] FMW:[id number] DOB: 10/16/1940 DOA: 05/13/2024     1 DOS: the patient was seen and examined on 05/14/2024 at 9:45AM      Brief hospital course: 83 y.o. F with PVD, smoking, gout, COPD, HTN, history CVA, dCHF, nonobstructive CAD, CKD IIIa, and recent acute limb ischemia with right SFA and popliteal stent who presented with foot pain, loss of pulses and tissue loss.  Transferred to [facility] for vascular surgery evaluation.     Assessment and Plan: Acute limb ischemia -Continue heparin , aspirin , Lipitor , Plavix  - Consult vascular surgery - Consult podiatry - Continue meropenem  -To the OR today with vascular surgery   Acute metabolic encephalopathy Due to infection, limb ischemia, age, pain, opiates. - Standard delirium precautions   Acute renal failure on CKD stage IIIa Baseline creatinine 1.1, creatinine 2.6 on admission, down to 1.4 overnight - IV fluids - Hold furosemide   Peripheral vascular disease Cerebrovascular disease Hypertension Chronic diastolic congestive heart failure Nonobstructive coronary artery disease with angina -Continue aspirin , amlodipine , Lipitor , Plavix , Imdur  -Hold furosemide  for now  COPD No active disease - Resume home Breo  Anemia of chronic kidney disease -Trend CBC postop        Subjective: Remains confused.  No fever. No wheezing or respiratory symptoms or cough.  Pain in the right leg is severe.     Physical Exam: BP (!) 137/58 (BP Location: Left Arm)   Pulse 96   Temp 98.7 F (37.1 C)   Resp 15   Ht 5' 1 (1.549 m)   Wt 50 kg Comment: from 04/27/24 office visit  SpO2 100%   BMI 20.83 kg/m   Thin confused elderly female, lying in bed, interactive and appropriate Tachycardic, regular, no murmurs, no peripheral edema Respiratory rate normal, lungs clear without rales or wheezes Abdomen soft, no tenderness palpation or guarding The right foot has skin breakdown, see previous  pictures, no change Mentation confused, weak, hallucinating.    Data Reviewed: Metabolic panel shows creatinine down to 1.4 CBC shows leukocytosis, hemoglobin down to 9     Family Communication:     Disposition: Status is: Inpatient         Author: Lonni SHAUNNA Dalton, MD 05/14/2024 1:42 PM  For on call review [email or web address].

## 2024-05-14 NOTE — Hospital Course (Signed)
 83 y.o. F with PVD, smoking, gout, COPD, HTN, history CVA, dCHF, nonobstructive CAD, CKD IIIa, and recent acute limb ischemia with right SFA and popliteal stent who presented with foot pain, loss of pulses and tissue loss.  Transferred to [facility] for vascular surgery evaluation.

## 2024-05-14 NOTE — Op Note (Signed)
° ° °  Patient name: [name] MRN: [id number] DOB: 19-Jan-1941 Sex: female  05/14/2024 Pre-operative Diagnosis: CLTI with foot wounds Post-operative diagnosis:  Same Surgeon:  Norman GORMAN Serve, MD Procedure Performed:  Ultrasound-guided access of left common femoral artery Aortogram and  right leg angiogram Third order cannulation of right popliteal UniFuse catheter placement into right popliteal and SFA with initiation of lysis  Indications: [name] is an 83 year old female with CL TI who underwent right SFA mechanical thrombectomy and stenting of the SFA and popliteal about 1 month ago.  She underwent right fifth toe amputation by Dr. Malvin on 11/26.  She presented to the emergency department yesterday with gangrenous changes of the right foot and there were no pedal pulses noted.  Risks and benefits of repeat angiogram with possible intervention were reviewed.  It was explained that her previous revascularizations likely occluded and that she would possibly need lysis.  Findings:  Patent aorta, renal arteries and iliac systems bilaterally Complete occlusion of the right SFA through the popliteal stents.  Reconstitution at the distal edge of the stent with three-vessel runoff   Procedure:  The patient was identified in the holding area and taken to the cath lab  The patient was then placed supine on the table and prepped and draped in the usual sterile fashion.  A time out was called.  Ultrasound was used to evaluate the left common femoral artery.  It was patent .  A digital ultrasound image was acquired.  A micropuncture needle was used to access the left common femoral artery under ultrasound guidance.  An 018 wire was advanced without resistance and a micropuncture sheath was placed.  The 018 wire was removed and a benson wire was placed.  The micropuncture sheath was exchanged for a 5 french sheath.  An omniflush catheter was advanced over the wire to the level of L-1.  An  abdominal angiogram was obtained.  Next, using the omniflush catheter and a glide advantage wire, the aortic bifurcation was crossed and the catheter was placed into theleft external iliac artery and left runoff was obtained. This demonstrated the above findings.  The glide advantage wire was then replaced with the catheter and into the SFA.  The short 5 French sheath was exchanged for a 6 French by 45 cm catapult sheath and the patient was given an additional 3000 unit bolus of heparin .  The wire was easily passed down through the SFA and popliteal stents.  A quick cross catheter was placed into the below-knee popliteal and an angiogram demonstrated access into the true lumen distally.  The wire was replaced through the catheter and into the popliteal.  Over this wire a 50 cm UniFuse catheter was placed and lysis was initiated at 1 mg/h.  Impression: Lysis initiation, plan for takeback tomorrow   Norman GORMAN Serve MD [facility] Office: [phone]

## 2024-05-14 NOTE — Plan of Care (Signed)

## 2024-05-14 NOTE — TOC CM/SW Note (Signed)
 Transition of Care [facility]) - Inpatient Brief Assessment   Patient Details  Name: [name] MRN: [id number] Date of Birth: 01/08/1941  Transition of Care [facility]) CM/SW Contact:    Tom-Johnson, Harvest Muskrat, RN Phone Number: 05/14/2024, 11:17 AM   Clinical Narrative:  Patient presented to the ED with worsening Rt Foot Pain and Swelling. Patient recently underwent Aortobifemoral Angiogram, Rt Superficial Femoral and Popliteal Artery Stenting and Mechanical Thrombectomy on 04/14/24 and also Rt Fifth Toe Amputation on 04/22/24.  Patient has hx of CKD, CAD, CHF, Stroke with Rt side residual, HTN, HLD, Chronic Diastolic Dysfunction, RLS, PAD, Renal Artery Stenosis and gout. Patient is admitted with Rt Foot Postoperative infection. On IV abx, Heparin  gtt. Vascular following, plan for Angiography today 05/14/24.   CM went to assess patient at bedside. Patient off the unit to Cath Lab. CM called and spoke with patient's significant other, [name]. Patient lives with [name], has four supportive children who lives out of town. Modified Independent, has a cane and walker at home. Uses [facility]) to and from appointments.  PCP is Zollie Lowers, MD and uses [employer].  Patient is active with [facility] with HHPT/OT disciplines. Resumption of care order will be placed if disposition is home. Patient not Medically ready for discharge.  CM will continue to follow as patient progresses with care towards discharge.             Transition of Care Asessment: Insurance and Status: Insurance coverage has been reviewed Patient has primary care physician: Yes Home environment has been reviewed: Yes Prior level of function:: Modified Independent Prior/Current Home Services: Current home services [employer], [employer]) Social Drivers of Health Review: SDOH reviewed no interventions necessary Readmission risk has been reviewed: Yes Transition of care needs: transition of care  needs identified, TOC will continue to follow

## 2024-05-14 NOTE — Progress Notes (Signed)
 PHARMACY - ANTICOAGULATION/Antibiotic CONSULT NOTE  Pharmacy Consult for Heparin   Indication: vascular occlusion  Allergies[1]  Patient Measurements: Height: 5' 1 (154.9 cm) Weight: 50 kg (110 lb 3.7 oz) (from 04/27/24 office visit) IBW/kg (Calculated) : 47.8  Vital Signs: Temp: 98.7 F (37.1 C) (12/18 2039) Temp Source: Oral (12/18 2039) BP: 156/83 (12/18 1904) Pulse Rate: 97 (12/18 1908)  Labs: Recent Labs    05/13/24 0328 05/13/24 1330 05/14/24 0445 05/14/24 0449 05/14/24 1006 05/14/24 2016  HGB 11.4*  --  9.1*  --   --  9.4*  HCT 35.6*  --  27.9*  --   --  29.8*  PLT 291  --  157  --   --  171  LABPROT 13.2  --   --   --   --   --   INR 1.0  --   --   --   --   --   HEPARINUNFRC  --    < >  --  0.26* 0.30 0.46  CREATININE 2.65*  --  1.42*  --   --   --    < > = values in this interval not displayed.    Estimated Creatinine Clearance: 23.4 mL/min (A) (by C-G formula based on SCr of 1.42 mg/dL (H)).   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 Heparin  for rule out vascular occlusion. Labs above reviewed. PTA meds reviewed.   12/18 AM: Heparin  level  subtherapeutic at 0.26 with heparin  running at 800 units/hour. Hgb dip 11.4>>9.1 with all cell lines down and NZ running at 83 mL/h in context of AKI- likely dilutional. No signs of bleeding or issues with the heparin  infusion noted.   12/18 PM: now s/p lysis w/ alteplase  1mg /hr, heparin  changed to 800 units/hr per VVS with goal level 0.2-0.5.  - heparin  level 0.46, CBC stable, fibrinogen  439  Goal of Therapy:  Heparin  level 0.2-0.5 units/ml Monitor platelets by anticoagulation protocol: Yes   Plan:  Continue heparin  infusion at 800 units/hr Heparin  level q6h Alteplase  at 1mg /hr Monitor for bleeding  Rocky Slade, PharmD, BCPS 05/14/2024 9:17 PM  Please check AMION for all [facility] Pharmacy phone numbers After 10:00 PM, call Main Pharmacy [phone]            [1]  Allergies Allergen Reactions   Penicillins Anaphylaxis, Swelling, Rash and Other (See Comments)    Immediate rash, facial/tongue/throat swelling, SOB or lightheadedness with hypotension

## 2024-05-15 ENCOUNTER — Encounter (HOSPITAL_COMMUNITY)
Admission: EM | Disposition: A | Discharge status: Skilled Nursing Facility | Payer: Self-pay | Source: Home / Self Care | Attending: Family Medicine

## 2024-05-15 DIAGNOSIS — M7989 Other specified soft tissue disorders: Secondary | ICD-10-CM

## 2024-05-15 DIAGNOSIS — T8140XA Infection following a procedure, unspecified, initial encounter: Secondary | ICD-10-CM | POA: Diagnosis not present

## 2024-05-15 DIAGNOSIS — Z9889 Other specified postprocedural states: Secondary | ICD-10-CM | POA: Diagnosis not present

## 2024-05-15 DIAGNOSIS — I70221 Atherosclerosis of native arteries of extremities with rest pain, right leg: Secondary | ICD-10-CM

## 2024-05-15 DIAGNOSIS — I743 Embolism and thrombosis of arteries of the lower extremities: Secondary | ICD-10-CM

## 2024-05-15 DIAGNOSIS — I70261 Atherosclerosis of native arteries of extremities with gangrene, right leg: Secondary | ICD-10-CM | POA: Diagnosis not present

## 2024-05-15 DIAGNOSIS — I70291 Other atherosclerosis of native arteries of extremities, right leg: Secondary | ICD-10-CM | POA: Diagnosis not present

## 2024-05-15 DIAGNOSIS — T82858A Stenosis of vascular prosthetic devices, implants and grafts, initial encounter: Secondary | ICD-10-CM | POA: Diagnosis not present

## 2024-05-15 HISTORY — PX: LOWER EXTREMITY INTERVENTION: CATH118252

## 2024-05-15 HISTORY — PX: PERIPHERAL VASCULAR THROMBECTOMY: CATH118306

## 2024-05-15 LAB — CBC
HCT: 30 % — ABNORMAL LOW (ref 36.0–46.0)
HCT: 32.3 % — ABNORMAL LOW (ref 36.0–46.0)
HCT: 28.4 % — ABNORMAL LOW (ref 36.0–46.0)
Hemoglobin: 9.3 g/dL — ABNORMAL LOW (ref 12.0–15.0)
Hemoglobin: 10.3 g/dL — ABNORMAL LOW (ref 12.0–15.0)
Hemoglobin: 9 g/dL — ABNORMAL LOW (ref 12.0–15.0)
MCH: 28.9 pg (ref 26.0–34.0)
MCH: 29.8 pg (ref 26.0–34.0)
MCH: 29.7 pg (ref 26.0–34.0)
MCHC: 31 g/dL (ref 30.0–36.0)
MCHC: 31.9 g/dL (ref 30.0–36.0)
MCHC: 31.7 g/dL (ref 30.0–36.0)
MCV: 93.2 fL (ref 80.0–100.0)
MCV: 93.4 fL (ref 80.0–100.0)
MCV: 93.7 fL (ref 80.0–100.0)
Platelets: 160 K/uL (ref 150–400)
Platelets: 147 K/uL — ABNORMAL LOW (ref 150–400)
Platelets: 119 K/uL — ABNORMAL LOW (ref 150–400)
RBC: 3.22 MIL/uL — ABNORMAL LOW (ref 3.87–5.11)
RBC: 3.46 MIL/uL — ABNORMAL LOW (ref 3.87–5.11)
RBC: 3.03 MIL/uL — ABNORMAL LOW (ref 3.87–5.11)
RDW: 13.8 % (ref 11.5–15.5)
RDW: 13.8 % (ref 11.5–15.5)
RDW: 13.8 % (ref 11.5–15.5)
WBC: 13.9 K/uL — ABNORMAL HIGH (ref 4.0–10.5)
WBC: 15.4 K/uL — ABNORMAL HIGH (ref 4.0–10.5)
WBC: 17.8 K/uL — ABNORMAL HIGH (ref 4.0–10.5)
nRBC: 0.1 % (ref 0.0–0.2)
nRBC: 0 % (ref 0.0–0.2)
nRBC: 0 % (ref 0.0–0.2)

## 2024-05-15 LAB — GLUCOSE, CAPILLARY
Glucose-Capillary: 117 mg/dL — ABNORMAL HIGH (ref 70–99)
Glucose-Capillary: 76 mg/dL (ref 70–99)
Glucose-Capillary: 100 mg/dL — ABNORMAL HIGH (ref 70–99)
Glucose-Capillary: 119 mg/dL — ABNORMAL HIGH (ref 70–99)

## 2024-05-15 LAB — HEPARIN LEVEL (UNFRACTIONATED)
Heparin Unfractionated: 0.19 [IU]/mL — ABNORMAL LOW (ref 0.30–0.70)
Heparin Unfractionated: <0.1 [IU]/mL — ABNORMAL LOW (ref 0.30–0.70)
Heparin Unfractionated: 0.56 [IU]/mL (ref 0.30–0.70)
Heparin Unfractionated: 0.25 [IU]/mL — ABNORMAL LOW (ref 0.30–0.70)

## 2024-05-15 LAB — FIBRINOGEN
Fibrinogen: 505 mg/dL — ABNORMAL HIGH (ref 210–475)
Fibrinogen: 540 mg/dL — ABNORMAL HIGH (ref 210–475)
Fibrinogen: 424 mg/dL (ref 210–475)

## 2024-05-15 LAB — BASIC METABOLIC PANEL WITH GFR
Anion gap: 13 (ref 5–15)
BUN: 28 mg/dL — ABNORMAL HIGH (ref 8–23)
CO2: 21 mmol/L — ABNORMAL LOW (ref 22–32)
Calcium: 8.3 mg/dL — ABNORMAL LOW (ref 8.9–10.3)
Chloride: 110 mmol/L (ref 98–111)
Creatinine, Ser: 1.22 mg/dL — ABNORMAL HIGH (ref 0.44–1.00)
GFR, Estimated: 44 mL/min — ABNORMAL LOW
Glucose, Bld: 68 mg/dL — ABNORMAL LOW (ref 70–99)
Potassium: 3.6 mmol/L (ref 3.5–5.1)
Sodium: 144 mmol/L (ref 135–145)

## 2024-05-15 SURGERY — PERIPHERAL VASCULAR THROMBECTOMY
Anesthesia: LOCAL

## 2024-05-15 MED ORDER — HEPARIN SODIUM (PORCINE) 1000 UNIT/ML IJ SOLN
INTRAMUSCULAR | Status: DC | PRN
  Administered 2024-05-15: 2000 [IU] via INTRAVENOUS
  Administered 2024-05-15: 5000 [IU] via INTRAVENOUS

## 2024-05-15 MED ORDER — HYDROMORPHONE HCL 1 MG/ML IJ SOLN
0.5000 mg | INTRAMUSCULAR | Status: DC | PRN
  Administered 2024-05-17 – 2024-05-27 (×4): 0.5 mg via INTRAVENOUS
  Filled 2024-05-15 (×2): qty 0.5

## 2024-05-15 MED ORDER — DEXTROSE 50 % IV SOLN
INTRAVENOUS | Status: DC | PRN
  Administered 2024-05-15: .5 via INTRAVENOUS

## 2024-05-15 MED ORDER — SODIUM CHLORIDE 0.9 % IV SOLN
1.0000 g | Freq: Two times a day (BID) | INTRAVENOUS | Status: AC
  Administered 2024-05-16 – 2024-05-19 (×9): 1 g via INTRAVENOUS
  Filled 2024-05-15 (×10): qty 20

## 2024-05-15 MED ORDER — SODIUM CHLORIDE 0.9 % IV SOLN
INTRAVENOUS | Status: AC | PRN
  Administered 2024-05-15: 10 mL/h via INTRAVENOUS

## 2024-05-15 MED ORDER — IODIXANOL 320 MG/ML IV SOLN
INTRAVENOUS | Status: DC | PRN
  Administered 2024-05-15: 125 mL

## 2024-05-15 MED ORDER — SODIUM CHLORIDE 0.9% FLUSH: 3.0000 mL | Freq: Two times a day (BID) | INTRAVENOUS | Status: DC

## 2024-05-15 MED ORDER — NALOXONE HCL 0.4 MG/ML IJ SOLN: 0.1000 mg | INTRAMUSCULAR | Status: DC | PRN

## 2024-05-15 MED ORDER — HEPARIN (PORCINE) 25000 UT/250ML-% IV SOLN
950.0000 [IU]/h | INTRAVENOUS | Status: DC
  Administered 2024-05-15 – 2024-05-17 (×2): 850 [IU]/h via INTRAVENOUS
  Administered 2024-05-18: 950 [IU]/h via INTRAVENOUS
  Filled 2024-05-15 (×2): qty 250

## 2024-05-15 MED ORDER — SODIUM CHLORIDE 0.9 % IV SOLN
250.0000 mL | INTRAVENOUS | Status: AC | PRN
  Administered 2024-05-16: 250 mL via INTRAVENOUS

## 2024-05-15 MED ORDER — LABETALOL HCL 5 MG/ML IV SOLN: 10.0000 mg | INTRAVENOUS | Status: DC | PRN

## 2024-05-15 MED ORDER — NITROGLYCERIN 1 MG/10 ML FOR IR/CATH LAB
INTRA_ARTERIAL | Status: AC
  Filled 2024-05-15: qty 10

## 2024-05-15 MED ORDER — ACETAMINOPHEN 325 MG PO TABS
650.0000 mg | ORAL_TABLET | ORAL | Status: DC | PRN
  Administered 2024-05-22 – 2024-05-26 (×4): 650 mg via ORAL
  Filled 2024-05-15: qty 2

## 2024-05-15 MED ORDER — SODIUM CHLORIDE 0.9 % WEIGHT BASED INFUSION
1.0000 mL/kg/h | INTRAVENOUS | Status: DC
  Administered 2024-05-15: 1 mL/kg/h via INTRAVENOUS

## 2024-05-15 MED ORDER — DEXTROSE 50 % IV SOLN
INTRAVENOUS | Status: AC
  Filled 2024-05-15: qty 50

## 2024-05-15 MED ORDER — NALOXONE HCL 0.4 MG/ML IJ SOLN
INTRAMUSCULAR | Status: AC
  Administered 2024-05-15: 0.1 mg via INTRAVENOUS
  Filled 2024-05-15: qty 1

## 2024-05-15 MED ORDER — LACTATED RINGERS IV BOLUS
1000.0000 mL | Freq: Once | INTRAVENOUS | Status: AC
  Administered 2024-05-15: 1000 mL via INTRAVENOUS

## 2024-05-15 MED ORDER — HEPARIN SODIUM (PORCINE) 1000 UNIT/ML IJ SOLN
INTRAMUSCULAR | Status: AC
  Filled 2024-05-15: qty 10

## 2024-05-15 MED ORDER — NITROGLYCERIN 1 MG/10 ML FOR IR/CATH LAB
INTRA_ARTERIAL | Status: DC | PRN
  Administered 2024-05-15: 200 ug via INTRA_ARTERIAL
  Administered 2024-05-15: 100 ug via INTRA_ARTERIAL
  Administered 2024-05-15: 200 ug via INTRA_ARTERIAL

## 2024-05-15 MED ORDER — SODIUM CHLORIDE 0.9 % IV BOLUS
INTRAVENOUS | Status: AC | PRN
  Administered 2024-05-15: 500 mL via INTRAVENOUS

## 2024-05-15 MED ORDER — HEPARIN (PORCINE) IN NACL 1000-0.9 UT/500ML-% IV SOLN
INTRAVENOUS | Status: DC | PRN
  Administered 2024-05-15: 1000 mL

## 2024-05-15 MED ORDER — DEXTROSE 5 % IV SOLN: INTRAVENOUS | Status: DC

## 2024-05-15 MED ORDER — HYDRALAZINE HCL 20 MG/ML IJ SOLN: 5.0000 mg | INTRAMUSCULAR | Status: DC | PRN

## 2024-05-15 MED ORDER — SODIUM CHLORIDE 0.9% FLUSH: 3.0000 mL | INTRAVENOUS | Status: DC | PRN

## 2024-05-15 SURGICAL SUPPLY — 22 items
BALLOON STERLING OTW 3X40X150 (BALLOONS) IMPLANT
BALLOON STERLING OTW 5X220X150 (BALLOONS) IMPLANT
CANISTER PENUMBRA ENGINE (MISCELLANEOUS) IMPLANT
CATH INDIGO CAT6 KIT (CATHETERS) IMPLANT
CATH OMNI FLUSH 5F 65CM (CATHETERS) IMPLANT
CATH QUICKCROSS .035X135CM (MICROCATHETER) IMPLANT
CLOSURE PERCLOSE PROSTYLE (Vascular Products) IMPLANT
KIT ENCORE 26 ADVANTAGE (KITS) IMPLANT
KIT ESSENTIALS PG (KITS) IMPLANT
KIT MICROPUNCTURE NIT STIFF (SHEATH) IMPLANT
KIT SINGLE USE MANIFOLD (KITS) IMPLANT
KIT SYRINGE INJ CVI SPIKEX1 (MISCELLANEOUS) IMPLANT
PACK CARDIAC CATHETERIZATION (CUSTOM PROCEDURE TRAY) IMPLANT
SET ATX-X65L (MISCELLANEOUS) IMPLANT
SHEATH PINNACLE 5F 10CM (SHEATH) IMPLANT
SHEATH PROBE COVER 6X72 (BAG) IMPLANT
STENT VIABAHN 6X150X120 (Permanent Stent) IMPLANT
STENT VIABAHN 6X250X120 (Permanent Stent) IMPLANT
STENT VIABAHN 6X50X120 (Permanent Stent) IMPLANT
WIRE BENTSON .035X145CM (WIRE) IMPLANT
WIRE G V18X300CM (WIRE) IMPLANT
WIRE SPARTACORE .014X300CM (WIRE) IMPLANT

## 2024-05-15 NOTE — Progress Notes (Addendum)
" °  Progress Note    05/15/2024 8:34 AM 1 Day Post-Op  Subjective: Has regained some feeling in her right heel.  No significant pain at the time of examination   Vitals:   05/15/24 0700 05/15/24 0734  BP: (!) 163/66   Pulse: (!) 103   Resp: 16   Temp:  98.7 F (37.1 C)  SpO2: (!) 88%    Physical Exam: Lungs: Nonlabored breathing Incisions: Left groin without firm hematoma or bleeding Extremities: Right foot appears necrotic with tissue changes involving the entire foot; she is able to wiggle her toes some; only able to obtain a venous lake peroneal signal by Doppler; right calf is soft Neurologic: A&O  CBC    Component Value Date/Time   WBC 15.4 (H) 05/15/2024 0701   RBC 3.46 (L) 05/15/2024 0701   HGB 10.3 (L) 05/15/2024 0701   HGB 12.9 03/17/2024 0842   HCT 32.3 (L) 05/15/2024 0701   HCT 41.0 03/17/2024 0842   PLT 147 (L) 05/15/2024 0701   PLT 306 03/17/2024 0842   MCV 93.4 05/15/2024 0701   MCV 97 03/17/2024 0842   MCH 29.8 05/15/2024 0701   MCHC 31.9 05/15/2024 0701   RDW 13.8 05/15/2024 0701   RDW 12.2 03/17/2024 0842   LYMPHSABS 1.3 05/13/2024 0328   LYMPHSABS 1.4 03/17/2024 0842   MONOABS 0.6 05/13/2024 0328   EOSABS 0.1 05/13/2024 0328   EOSABS 0.2 03/17/2024 0842   BASOSABS 0.0 05/13/2024 0328   BASOSABS 0.0 03/17/2024 0842    BMET    Component Value Date/Time   NA 144 05/15/2024 0142   NA 146 (H) 03/17/2024 0842   K 3.6 05/15/2024 0142   CL 110 05/15/2024 0142   CO2 21 (L) 05/15/2024 0142   GLUCOSE 68 (L) 05/15/2024 0142   BUN 28 (H) 05/15/2024 0142   BUN 8 03/17/2024 0842   CREATININE 1.22 (H) 05/15/2024 0142   CALCIUM  8.3 (L) 05/15/2024 0142   GFRNONAA 44 (L) 05/15/2024 0142   GFRAA 53 (L) 01/23/2020 0700    INR    Component Value Date/Time   INR 1.0 05/13/2024 0328     Intake/Output Summary (Last 24 hours) at 05/15/2024 0834 Last data filed at 05/15/2024 9271 Gross per 24 hour  Intake 1790.83 ml  Output 375 ml  Net 1415.83 ml      Assessment/Plan:  83 y.o. female is s/p initiation of thrombolysis of the right lower extremity via the left common femoral artery 1 Day Post-Op   Subjectively, she has regained some feeling in her heel however she has extensive tissue loss on the entire right foot up to the ankle.  I am only able to obtain a venous-like peroneal signal by Doppler.  Left groin cath site is without hematoma or bleeding.  We discussed she will likely require a major leg amputation given the extent of tissue loss.  Plan is to return to the Cath Lab today for lysis recheck.  Continue NPO with sips.   Donnice Sender, PA-C [facility] [phone] 05/15/2024 8:34 AM  Patient seen and examined in preop holding.  No complaints. No changes to medication history or physical exam since last seen in clinic. After discussing the risks and benefits of lysis catheter recheck, Emmalynn elected to proceed, Lavella LULLA Carrel elected to proceed.   Fonda FORBES Rim MD   "

## 2024-05-15 NOTE — Progress Notes (Signed)
 PHARMACY - ANTICOAGULATION/Antibiotic CONSULT NOTE  Pharmacy Consult for Heparin   Indication: vascular occlusion  Allergies[1]  Patient Measurements: Height: 5' 1 (154.9 cm) Weight: 50 kg (110 lb 3.7 oz) (from 04/27/24 office visit) IBW/kg (Calculated) : 47.8  Vital Signs: Temp: 98.7 F (37.1 C) (12/18 2332) Temp Source: Oral (12/18 2332) BP: 156/83 (12/18 1904) Pulse Rate: 88 (12/19 0130)  Labs: Recent Labs    05/13/24 0328 05/13/24 1330 05/14/24 0445 05/14/24 0449 05/14/24 1006 05/14/24 2016 05/15/24 0142  HGB 11.4*  --  9.1*  --   --  9.4* 9.3*  HCT 35.6*  --  27.9*  --   --  29.8* 30.0*  PLT 291  --  157  --   --  171 160  LABPROT 13.2  --   --   --   --   --   --   INR 1.0  --   --   --   --   --   --   HEPARINUNFRC  --    < >  --    < > 0.30 0.46 0.19*  CREATININE 2.65*  --  1.42*  --   --   --   --    < > = values in this interval not displayed.    Estimated Creatinine Clearance: 23.4 mL/min (A) (by C-G formula based on SCr of 1.42 mg/dL (H)).   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 Heparin  for rule out vascular occlusion. Labs above  reviewed. PTA meds reviewed.   AM: heparin  level slightly below goal on 800 units/hr with alteplase  1mg /hr running. Hgb low, stable and plts 160, no signs/symptoms of bleeding per RN. Fibrinogen  505  Goal of Therapy:  Heparin  level 0.2-0.5 units/ml Monitor platelets by anticoagulation protocol: Yes   Plan:  Slight increase of heparin  infusion to 850 units/hr Heparin  level q6h Alteplase  at 1mg /hr Monitor for bleeding  Lynwood Poplar, PharmD, BCPS Clinical Pharmacist 05/15/2024 3:28 AM       [1]  Allergies Allergen Reactions   Penicillins Anaphylaxis, Swelling, Rash and Other (See Comments)    Immediate rash, facial/tongue/throat swelling, SOB or lightheadedness with hypotension

## 2024-05-15 NOTE — Progress Notes (Signed)
 PHARMACY - ANTICOAGULATION/Antibiotic CONSULT NOTE  Pharmacy Consult for Heparin   Indication: vascular occlusion  Allergies[1]  Patient Measurements: Height: 5' 1 (154.9 cm) Weight: 50 kg (110 lb 3.7 oz) (from 04/27/24 office visit) IBW/kg (Calculated) : 47.8  Vital Signs: Temp: 98.7 F (37.1 C) (12/19 0734) Temp Source: Oral (12/19 0734) BP: 97/56 (12/19 1400) Pulse Rate: 97 (12/19 1400)  Labs: Recent Labs    05/13/24 0328 05/13/24 1330 05/14/24 0445 05/14/24 0449 05/14/24 2016 05/15/24 0142 05/15/24 0701 05/15/24 1354  HGB 11.4*  --  9.1*  --  9.4* 9.3* 10.3* 9.0*  HCT 35.6*  --  27.9*  --  29.8* 30.0* 32.3* 28.4*  PLT 291  --  157  --  171 160 147* 119*  LABPROT 13.2  --   --   --   --   --   --   --   INR 1.0  --   --   --   --   --   --   --   HEPARINUNFRC  --    < >  --    < > 0.46 0.19*  --  0.56  CREATININE 2.65*  --  1.42*  --   --  1.22*  --   --    < > = values in this interval not displayed.    Estimated Creatinine Clearance: 27.3 mL/min (A) (by C-G formula based on SCr of 1.22 mg/dL (H)).   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 Heparin   for rule out vascular occlusion. Labs above reviewed. PTA meds reviewed.   Pt s/p lysis, heparin  to resume 6h after sheath removal (~1105). Will target lower heparin  level goal overnight.  Goal of Therapy:  Heparin  level 0.3-0.5 units/ml Monitor platelets by anticoagulation protocol: Yes   Plan:  Heparin  850 units/h starting 1700 no bolus Check heparin  level 6h after  [name], PharmD, BCPS, [facility] Clinical Pharmacist [phone] Please check AMION for all [facility] numbers 05/15/2024              [1]  Allergies Allergen Reactions   Penicillins Anaphylaxis, Swelling, Rash and Other (See Comments)    Immediate rash, facial/tongue/throat swelling, SOB or lightheadedness with hypotension

## 2024-05-15 NOTE — Progress Notes (Signed)
 PHARMACY - ANTICOAGULATION/Antibiotic CONSULT NOTE  Pharmacy Consult for Heparin   Indication: vascular occlusion  Allergies[1]  Patient Measurements: Height: 5' 1 (154.9 cm) Weight: 50 kg (110 lb 3.7 oz) (from 04/27/24 office visit) IBW/kg (Calculated) : 47.8  Vital Signs: Temp: 100 F (37.8 C) (12/19 2042) Temp Source: Oral (12/19 2042) BP: 154/90 (12/19 2042) Pulse Rate: 105 (12/19 2042)  Labs: Recent Labs    05/13/24 0328 05/13/24 1330 05/14/24 0445 05/14/24 0449 05/15/24 0142 05/15/24 0701 05/15/24 1354 05/15/24 2242  HGB 11.4*  --  9.1*   < > 9.3* 10.3* 9.0*  --   HCT 35.6*  --  27.9*   < > 30.0* 32.3* 28.4*  --   PLT 291  --  157   < > 160 147* 119*  --   LABPROT 13.2  --   --   --   --   --   --   --   INR 1.0  --   --   --   --   --   --   --   HEPARINUNFRC  --    < >  --    < > 0.19* <0.10* 0.56 0.25*  CREATININE 2.65*  --  1.42*  --  1.22*  --   --   --    < > = values in this interval not displayed.    Estimated Creatinine Clearance: 27.3 mL/min (A) (by C-G formula based on SCr of 1.22 mg/dL (H)).   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  Heparin  for rule out vascular occlusion. Labs above reviewed. PTA meds reviewed.   Pt s/p lysis, heparin  to resume 6h after sheath removal (~1105). Will target lower heparin  level goal overnight.  PM: heparin  level slightly below goal (~4h after restart). Will continue same rate to level is at steady state and recheck with AM labs. No issues per RN.  Goal of Therapy:  Heparin  level 0.3-0.5 units/ml Monitor platelets by anticoagulation protocol: Yes   Plan:  Continue heparin  850 units/h  Check heparin  level with AM labs CBC daily  Lynwood Poplar, PharmD, BCPS Clinical Pharmacist 05/15/2024 81:46 PM    [1]  Allergies Allergen Reactions   Penicillins Anaphylaxis, Swelling, Rash and Other (See Comments)    Immediate rash, facial/tongue/throat swelling, SOB or lightheadedness with hypotension

## 2024-05-15 NOTE — Progress Notes (Signed)
" °  Progress Note   Patient: [name] FMW:[id number] DOB: [birth year] DOA: 05/13/2024     2 DOS: the patient was seen and examined on 05/15/2024 at 8:30AM      Brief hospital course: 83 y.o. F with PVD, smoking, gout, COPD, HTN, history CVA, dCHF, nonobstructive CAD, CKD IIIa, and recent acute limb ischemia with right SFA and popliteal stent who presented with foot pain, loss of pulses and tissue loss.  Transferred to [facility] for vascular surgery evaluation.     Assessment and Plan: Acute limb ischemia Taken to OR 12/18 for placement of lytic catheter. Today going back to cath lab.  Toes are ischemic, will need amputation. - Continue heparin , aspirin , Liptior, Plavix  per VVS - Consult podiatry - Continue meropenem  for now       Acute metabolic encephalopathy At baseline without significant cogntiiev impairment, here, intermittently confused. Due to infection, limb ischemia, age, pain, opiates. - Standard delirium precautions     Acute renal failure on CKD stage IIIa Baseline creatinine 1.1, creatinine 2.6 on admission, improved to 1.2 with fluids Hypotensive today - Continue IV fluids - Trend Cr - Hold Lasix    Peripheral vascular disease Cerebrovascular disease Hypertension Chronic diastolic congestive heart failure Nonobstructive coronary artery disease with angina BP low - Hold furosemide , amlodipine , Imdur  - Continue aspirin , Plavix , Lipitor      COPD No symptoms - Continue Breo   Anemia of chronic kidney disease Hgb stable - Trend Hgb           Subjective: patient complaining of leg and foot pain.  Mentation good this monring, worse this afternoon.     Physical Exam: BP (!) 91/52   Pulse 98   Temp 98.7 F (37.1 C) (Oral)   Resp 16   Ht 5' 1 (1.549 m)   Wt 50 kg Comment: from 04/27/24 office visit  SpO2 (!) 80%   BMI 20.83 kg/m   General: Pt is awake tired, sleepy after proeceudre Cardiovascular: Tachy regular, nl S1-S2, no murmurs  appreciated.   No LE edema.  Toes blue on right foot Respiratory: Normal respiratory rate and rhythm.  CTAB without rales or wheezes. Abdominal: Abdomen soft and non-tender.  No distension or HSM.   Neuro/Psych: Strength symmetric in upper and lower extremities.  Judgment and insight appear impaired.   Data Reviewed: Discussed with CCM team BMP shows improved Cr CBC showed leukocytosis, anemia       Disposition: Status is: Inpatient         Author: Lonni SHAUNNA Dalton, MD 05/15/2024 3:03 PM  For on call review [email or web address].    "

## 2024-05-15 NOTE — Progress Notes (Signed)
 RN requested CCM to bedside. Elderly female who returned from the cath lab a short while earlier where she had undergone lysis check and removal of left femoral catheter with Vascular Surgery. Exam notable for non-responsive female with SpO2 ~92% on room air, and SBP 79 with MAP 63 -- somewhat lower than immediately after return from cath lab. Pupils pinpoint bilaterally.  Had received a dose of dilaudid  and a dose of oxycodone , both much earlier today ~8-9 am, but in the setting of pinpoint pupils a dose of naloxone  0.1 mg IV was administered. Patient subsequently more responsive and able to verbalize I'm cold. Squeezes hands to command bilaterally. Able to verbalize absence of any pain. BP improved. RN to CTM mental status closely. OK to administer an additional dose of naloxone  if needed.

## 2024-05-15 NOTE — Op Note (Signed)
" ° ° °  Patient name: [name] MRN: [id number] DOB: 1940-06-28 Sex: female  05/15/2024 Pre-operative Diagnosis: Right lower extremity critical ischemia tissue loss and occluded stents currently undergoing catheter directed thrombolysis Post-operative diagnosis:  Same Surgeon:  Fonda FORBES Rim, MD Procedure Performed: 1.  Right lower extremity angiogram 2.  Percutaneous mechanical thrombectomy CAT 6 penumbra device superficial femoral artery, popliteal artery, tibioperoneal trunk, peroneal artery 3.  Viabahn stenting 6 x 25, 6 x 15, 6 x 5 superficial femoral artery, popliteal artery 4.  Balloon angioplasty tibioperoneal trunk, anterior tibial artery, posterior tibial artery 5.  Access manage using ProGlide device without issue 6.  No sedation medication administered   Indications: Patient is 83 year old female with previous history of right lower extremity stenting.  The stents occluded leading to lower extremity ischemia.  She is currently undergoing lysis.  Findings:  Superficial femoral artery with proximal disease, partially occlusive thrombus. Partially occlusive thrombus appreciated throughout the superficial femoral artery, popliteal artery.  Distally, the popliteal artery is patent, with occlusion of the anterior tibial artery, tibioperoneal trunk, peroneal artery, posterior tibial artery.   Procedure:  The patient was identified in the holding area and taken to room 8.  The patient was then placed supine on the table and prepped and draped in the usual sterile fashion.  A time out was called.    Case began with removal of the lytic catheter over a 0.018 wire and diagnostic right lower extremity angiogram followed.  See results above.  I elected to attempt intervention.  The CAT 6 penumbra device was brought onto the field and run from the superficial femoral artery through the popliteal artery, tibioperoneal trunk, into the peroneal artery.  Follow-up angiography demonstrated  significant improvement.  There is continued to be nonocclusive thrombus in the SFA as well as stenosis at the proximal anterior tibial artery and proximal posterior tibial artery.  I elected to restent the superficial femoral artery using a Viabahn to trap the thrombus.  Viabahn stents were placed from P2 popliteal artery to the ostia of the superficial femoral artery.  6 x 25, 6 x 15, 6.5 superficial femoral artery, popliteal artery Viabahn stents.  Follow-up angiography demonstrated excellent result.  Distally, there continued to be stenosis at the anterior tibial artery and posterior tibial arteries.  There was also a significant mount of vasospasm.  400 mcg of nitroglycerin  were administered intra-arterially and 200 mcg intervals.  This demonstrated significant improvement.  Next, I took a 3 mm balloon and balloon the anterior tibial artery, as well as the posterior tibial artery with follow-up angiography demonstrating excellent result with midline flow to the foot.  The patient has been maximally vascularized.  Closed using a ProGlide device without issue.  Impression: Successful recanalization of the right superficial femoral artery stents with outflow percutaneous mechanical thrombectomy and restenting of the SFA.  Patient has been maximally revascularized.  Palpable pulse in the foot at case completion.     Fonda FORBES Rim MD [facility] Office: [phone]    "

## 2024-05-15 NOTE — Progress Notes (Signed)
 Observed on chart review that patient had become hypotensive, unresponsive and received Narcan ordered by Critical Care medicine.  I was not notified of this change of status by nursing or Dr. Olena.  On my evaluation now, patient is awake, sleepy but responsive, oriented to self only, but reports she is comfortable.  No pain, no dizziness, no malaise or apprehension.  Just would like something to drink.  BP remains low, tachycardic.  - LR bolus and run maintenance - Accuchecks

## 2024-05-16 DIAGNOSIS — T8140XA Infection following a procedure, unspecified, initial encounter: Secondary | ICD-10-CM | POA: Diagnosis not present

## 2024-05-16 DIAGNOSIS — I7092 Chronic total occlusion of artery of the extremities: Secondary | ICD-10-CM | POA: Diagnosis not present

## 2024-05-16 DIAGNOSIS — T82856A Stenosis of peripheral vascular stent, initial encounter: Secondary | ICD-10-CM | POA: Diagnosis not present

## 2024-05-16 DIAGNOSIS — I70261 Atherosclerosis of native arteries of extremities with gangrene, right leg: Secondary | ICD-10-CM | POA: Diagnosis not present

## 2024-05-16 LAB — CBC
HCT: 25.1 % — ABNORMAL LOW (ref 36.0–46.0)
Hemoglobin: 7.8 g/dL — ABNORMAL LOW (ref 12.0–15.0)
MCH: 29.2 pg (ref 26.0–34.0)
MCHC: 31.1 g/dL (ref 30.0–36.0)
MCV: 94 fL (ref 80.0–100.0)
Platelets: 88 K/uL — ABNORMAL LOW (ref 150–400)
RBC: 2.67 MIL/uL — ABNORMAL LOW (ref 3.87–5.11)
RDW: 13.7 % (ref 11.5–15.5)
WBC: 12.8 K/uL — ABNORMAL HIGH (ref 4.0–10.5)
nRBC: 0 % (ref 0.0–0.2)

## 2024-05-16 LAB — LIPID PANEL
Cholesterol: 132 mg/dL (ref 0–200)
HDL: 37 mg/dL — ABNORMAL LOW
LDL Cholesterol: 82 mg/dL (ref 0–99)
Total CHOL/HDL Ratio: 3.6 ratio
Triglycerides: 68 mg/dL
VLDL: 14 mg/dL (ref 0–40)

## 2024-05-16 LAB — GLUCOSE, CAPILLARY
Glucose-Capillary: 112 mg/dL — ABNORMAL HIGH (ref 70–99)
Glucose-Capillary: 117 mg/dL — ABNORMAL HIGH (ref 70–99)
Glucose-Capillary: 118 mg/dL — ABNORMAL HIGH (ref 70–99)
Glucose-Capillary: 119 mg/dL — ABNORMAL HIGH (ref 70–99)
Glucose-Capillary: 123 mg/dL — ABNORMAL HIGH (ref 70–99)
Glucose-Capillary: 126 mg/dL — ABNORMAL HIGH (ref 70–99)
Glucose-Capillary: 133 mg/dL — ABNORMAL HIGH (ref 70–99)

## 2024-05-16 LAB — BASIC METABOLIC PANEL WITH GFR
Anion gap: 8 (ref 5–15)
BUN: 20 mg/dL (ref 8–23)
CO2: 22 mmol/L (ref 22–32)
Calcium: 7.5 mg/dL — ABNORMAL LOW (ref 8.9–10.3)
Chloride: 111 mmol/L (ref 98–111)
Creatinine, Ser: 1.09 mg/dL — ABNORMAL HIGH (ref 0.44–1.00)
GFR, Estimated: 51 mL/min — ABNORMAL LOW
Glucose, Bld: 114 mg/dL — ABNORMAL HIGH (ref 70–99)
Potassium: 3.6 mmol/L (ref 3.5–5.1)
Sodium: 141 mmol/L (ref 135–145)

## 2024-05-16 LAB — HEPARIN LEVEL (UNFRACTIONATED): Heparin Unfractionated: 0.36 [IU]/mL (ref 0.30–0.70)

## 2024-05-16 MED ORDER — EZETIMIBE 10 MG PO TABS
10.0000 mg | ORAL_TABLET | Freq: Every day | ORAL | Status: DC
  Administered 2024-05-16 – 2024-05-27 (×12): 10 mg via ORAL
  Filled 2024-05-16 (×10): qty 1

## 2024-05-16 NOTE — Progress Notes (Signed)
 PHARMACY - ANTICOAGULATION/Antibiotic CONSULT NOTE  Pharmacy Consult for Heparin   Indication: vascular occlusion  Allergies[1]  Patient Measurements: Height: 5' 1 (154.9 cm) Weight: 50 kg (110 lb 3.7 oz) (from 04/27/24 office visit) IBW/kg (Calculated) : 47.8  Vital Signs: Temp: 98.9 F (37.2 C) (12/20 0747) Temp Source: Oral (12/20 0747) BP: 116/60 (12/20 0747) Pulse Rate: 110 (12/20 0934)  Labs: Recent Labs    05/14/24 0445 05/14/24 0449 05/15/24 0142 05/15/24 0701 05/15/24 1354 05/15/24 2242 05/16/24 0754  HGB 9.1*   < > 9.3* 10.3* 9.0*  --  7.8*  HCT 27.9*   < > 30.0* 32.3* 28.4*  --  25.1*  PLT 157   < > 160 147* 119*  --  88*  HEPARINUNFRC  --    < > 0.19* <0.10* 0.56 0.25* 0.36  CREATININE 1.42*  --  1.22*  --   --   --  1.09*   < > = values in this interval not displayed.    Estimated Creatinine Clearance: 30.5 mL/min (A) (by C-G formula based on SCr of 1.09 mg/dL (H)).   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 Heparin  for rule out vascular occlusion. Labs above reviewed. PTA meds reviewed. Pt s/p lysis, heparin  to resumed 6h after sheath  removal (12/19 ~1105). Will target lower heparin  level goal. Potential amputation Monday, 12/22.   AM: heparin  level therapeutic at 0.36 on 850 units/hr. Hgb dropped from 9 to 7.8, PLT dropped from 119 to 88, but hx of low platelets in 02/2024. Will continue to monitor. No issues with infusion or signs of bleeding per RN.   Goal of Therapy:  Heparin  level 0.3-0.5 units/ml Monitor platelets by anticoagulation protocol: Yes   Plan:  Continue heparin  850 units/h  Check daily heparin  level CBC daily F/u heparin  plan with VVS  Izetta Carl, PharmD PGY1 Pharmacy Resident      [1]  Allergies Allergen Reactions   Penicillins Anaphylaxis, Swelling, Rash and Other (See Comments)    Immediate rash, facial/tongue/throat swelling, SOB or lightheadedness with hypotension

## 2024-05-16 NOTE — Progress Notes (Signed)
" °  Progress Note    05/16/2024 8:39 AM 1 Day Post-Op  Subjective: Having pain in bilateral feet   Vitals:   05/16/24 0316 05/16/24 0747  BP: 121/82 116/60  Pulse: 92 83  Resp: 20 15  Temp: 97.8 F (36.6 C) 98.9 F (37.2 C)  SpO2: 95% 96%   Physical Exam: Lungs: Nonlabored breathing Incisions: Left groin without firm hematoma or bleeding Extremities: Right foot is nonviable.  Neurologic: A&O  CBC    Component Value Date/Time   WBC 17.8 (H) 05/15/2024 1354   RBC 3.03 (L) 05/15/2024 1354   HGB 9.0 (L) 05/15/2024 1354   HGB 12.9 03/17/2024 0842   HCT 28.4 (L) 05/15/2024 1354   HCT 41.0 03/17/2024 0842   PLT 119 (L) 05/15/2024 1354   PLT 306 03/17/2024 0842   MCV 93.7 05/15/2024 1354   MCV 97 03/17/2024 0842   MCH 29.7 05/15/2024 1354   MCHC 31.7 05/15/2024 1354   RDW 13.8 05/15/2024 1354   RDW 12.2 03/17/2024 0842   LYMPHSABS 1.3 05/13/2024 0328   LYMPHSABS 1.4 03/17/2024 0842   MONOABS 0.6 05/13/2024 0328   EOSABS 0.1 05/13/2024 0328   EOSABS 0.2 03/17/2024 0842   BASOSABS 0.0 05/13/2024 0328   BASOSABS 0.0 03/17/2024 0842    BMET    Component Value Date/Time   NA 141 05/16/2024 0754   NA 146 (H) 03/17/2024 0842   K 3.6 05/16/2024 0754   CL 111 05/16/2024 0754   CO2 22 05/16/2024 0754   GLUCOSE 114 (H) 05/16/2024 0754   BUN 20 05/16/2024 0754   BUN 8 03/17/2024 0842   CREATININE 1.09 (H) 05/16/2024 0754   CALCIUM  7.5 (L) 05/16/2024 0754   GFRNONAA 51 (L) 05/16/2024 0754   GFRAA 53 (L) 01/23/2020 0700    INR    Component Value Date/Time   INR 1.0 05/13/2024 0328     Intake/Output Summary (Last 24 hours) at 05/16/2024 0839 Last data filed at 05/15/2024 2103 Gross per 24 hour  Intake 525.32 ml  Output 300 ml  Net 225.32 ml     Assessment/Plan:  83 y.o. female is s/p initiation of thrombolysis of the right lower extremity via the left common femoral artery 1 Day Post-Op   Perfusion was restored yesterday, however her foot was likely  nonviable on presentation and has not responded well to revascularization. I explained that she would require and amputation which would be above the knee. She said she is willing to proceed with anything necessary. I will discuss with her husband and if in agreement will likely schedule for Monday.  Norman GORMAN Serve MD [facility] Phone Number: [phone] 05/16/2024 8:42 AM      "

## 2024-05-16 NOTE — Progress Notes (Signed)
" °  Progress Note   Patient: [name] FMW:[id number] DOB: 1940-10-07 DOA: 05/13/2024     3 DOS: the patient was seen and examined on 05/16/2024 at 8:30AM      Brief hospital course: 83 y.o. F with PVD, smoking, gout, COPD, HTN, history CVA, dCHF, nonobstructive CAD, CKD IIIa, and recent acute limb ischemia with right SFA and popliteal stent who presented with foot pain, loss of pulses and tissue loss.  Transferred to [facility] for vascular surgery evaluation.     Assessment and Plan: Acute limb ischemia Taken to OR 12/18 for placement of lytic catheter. Back to cath lab 12/19 and catheter removed, discussed with vascular, good result.  Toes are ischemic, will need amputation, vascular suggseting above knee, they will discuss with family and plan for Monday - Continue heparin , aspirin , Liptior, Plavix  per VVS - Consult podiatry - Continue meropenem  for now - Add Zetia  for vascular disease     Anemia of chronic kidney disease Hgb worsening in last 24 hours NO bleeding reported or observed - Continue heparin  for now - Trend Hgb    Thrombocytopenia This has dropped quickly.  May need to hold heparin  soon  - Trend Platlets - Continue heparin  for now  Acute metabolic encephalopathy At baseline without significant cogntiiev impairment, here, intermittently confused. Due to infection, limb ischemia, age, pain, opiates. Waxeing and waning, mostly she is not oriented. - Standard delirium precautions     Acute renal failure on CKD stage IIIa Baseline creatinine 1.1, creatinine 2.6 on admission, improved to 1.1 with fluids Stable - Trend Cr - Hold Lasix    Peripheral vascular disease Cerebrovascular disease Hypertension Chronic diastolic congestive heart failure Nonobstructive coronary artery disease with angina BP improved today - Hold furosemide , amlodipine , Imdur  until hemodynamics clearer - Continue aspirin , Plavix , Lipitor  - Start Zetia      COPD No symptoms   - Continue Breo         Subjective: Mentation waxinga nd waning.  No fever, no chest pain, no respiratory symtoms, no nursing concerns reported     Physical Exam: BP (!) 141/58 (BP Location: Left Arm)   Pulse 90   Temp 98.1 F (36.7 C) (Oral)   Resp 16   Ht 5' 1 (1.549 m)   Wt 50 kg Comment: from 04/27/24 office visit  SpO2 90%   BMI 20.83 kg/m   General: Pt is awake tired, sleepy after proeceudre Cardiovascular: Tachy regular, nl S1-S2, no murmurs appreciated.   No LE edema.  Toes blue on right foot Respiratory: Normal respiratory rate and rhythm.  CTAB without rales or wheezes. Abdominal: Abdomen soft and non-tender.  No distension or HSM.   Neuro/Psych: Strength symmetric in upper and lower extremities.  Judgment and insight appear impaired.   Data Reviewed: BMP shows improved Cr CBC showed worsening anemia and thrombocytoepnia      Disposition: Status is: Inpatient Presented with nonviable leg, now s./p revascularization but will need AKA, likely Monday        Author: Lonni SHAUNNA Dalton, MD 05/16/2024 5:54 PM  For on call review [email or web address].    "

## 2024-05-17 ENCOUNTER — Encounter (HOSPITAL_COMMUNITY): Payer: Self-pay | Admitting: Vascular Surgery

## 2024-05-17 DIAGNOSIS — E785 Hyperlipidemia, unspecified: Secondary | ICD-10-CM | POA: Diagnosis not present

## 2024-05-17 DIAGNOSIS — I739 Peripheral vascular disease, unspecified: Secondary | ICD-10-CM

## 2024-05-17 DIAGNOSIS — J4489 Other specified chronic obstructive pulmonary disease: Secondary | ICD-10-CM | POA: Diagnosis not present

## 2024-05-17 DIAGNOSIS — T8140XA Infection following a procedure, unspecified, initial encounter: Secondary | ICD-10-CM | POA: Diagnosis not present

## 2024-05-17 LAB — COMPREHENSIVE METABOLIC PANEL WITH GFR
ALT: <5 U/L (ref 0–44)
AST: 24 U/L (ref 15–41)
Albumin: 2 g/dL — ABNORMAL LOW (ref 3.5–5.0)
Alkaline Phosphatase: 41 U/L (ref 38–126)
Anion gap: 7 (ref 5–15)
BUN: 20 mg/dL (ref 8–23)
CO2: 23 mmol/L (ref 22–32)
Calcium: 7.5 mg/dL — ABNORMAL LOW (ref 8.9–10.3)
Chloride: 110 mmol/L (ref 98–111)
Creatinine, Ser: 1.12 mg/dL — ABNORMAL HIGH (ref 0.44–1.00)
GFR, Estimated: 49 mL/min — ABNORMAL LOW
Glucose, Bld: 122 mg/dL — ABNORMAL HIGH (ref 70–99)
Potassium: 3.4 mmol/L — ABNORMAL LOW (ref 3.5–5.1)
Sodium: 139 mmol/L (ref 135–145)
Total Bilirubin: 0.3 mg/dL (ref 0.0–1.2)
Total Protein: 4.5 g/dL — ABNORMAL LOW (ref 6.5–8.1)

## 2024-05-17 LAB — CBC
HCT: 23.9 % — ABNORMAL LOW (ref 36.0–46.0)
HCT: 30.3 % — ABNORMAL LOW (ref 36.0–46.0)
Hemoglobin: 7.7 g/dL — ABNORMAL LOW (ref 12.0–15.0)
Hemoglobin: 9.6 g/dL — ABNORMAL LOW (ref 12.0–15.0)
MCH: 29.3 pg (ref 26.0–34.0)
MCH: 29.1 pg (ref 26.0–34.0)
MCHC: 32.2 g/dL (ref 30.0–36.0)
MCHC: 31.7 g/dL (ref 30.0–36.0)
MCV: 90.9 fL (ref 80.0–100.0)
MCV: 91.8 fL (ref 80.0–100.0)
Platelets: 69 K/uL — ABNORMAL LOW (ref 150–400)
Platelets: 68 K/uL — ABNORMAL LOW (ref 150–400)
RBC: 2.63 MIL/uL — ABNORMAL LOW (ref 3.87–5.11)
RBC: 3.3 MIL/uL — ABNORMAL LOW (ref 3.87–5.11)
RDW: 13.5 % (ref 11.5–15.5)
RDW: 13.5 % (ref 11.5–15.5)
WBC: 9.9 K/uL (ref 4.0–10.5)
WBC: 10 K/uL (ref 4.0–10.5)
nRBC: 0 % (ref 0.0–0.2)
nRBC: 0 % (ref 0.0–0.2)

## 2024-05-17 LAB — GLUCOSE, CAPILLARY
Glucose-Capillary: 108 mg/dL — ABNORMAL HIGH (ref 70–99)
Glucose-Capillary: 111 mg/dL — ABNORMAL HIGH (ref 70–99)
Glucose-Capillary: 115 mg/dL — ABNORMAL HIGH (ref 70–99)
Glucose-Capillary: 131 mg/dL — ABNORMAL HIGH (ref 70–99)
Glucose-Capillary: 86 mg/dL (ref 70–99)
Glucose-Capillary: 88 mg/dL (ref 70–99)

## 2024-05-17 LAB — HEPARIN LEVEL (UNFRACTIONATED)
Heparin Unfractionated: 0.18 [IU]/mL — ABNORMAL LOW (ref 0.30–0.70)
Heparin Unfractionated: 0.36 [IU]/mL (ref 0.30–0.70)
Heparin Unfractionated: 0.32 [IU]/mL (ref 0.30–0.70)

## 2024-05-17 MED ORDER — ENSURE PLUS HIGH PROTEIN PO LIQD
237.0000 mL | Freq: Two times a day (BID) | ORAL | Status: DC
  Administered 2024-05-17 – 2024-05-27 (×16): 237 mL via ORAL
  Filled 2024-05-17: qty 474

## 2024-05-17 MED ORDER — POTASSIUM CHLORIDE CRYS ER 20 MEQ PO TBCR
40.0000 meq | EXTENDED_RELEASE_TABLET | Freq: Once | ORAL | Status: AC
  Administered 2024-05-17: 40 meq via ORAL
  Filled 2024-05-17 (×2): qty 2

## 2024-05-17 NOTE — Plan of Care (Signed)

## 2024-05-17 NOTE — Progress Notes (Signed)
 PHARMACIST LIPID MONITORING   [name] is a 83 y.o. female admitted on 05/13/2024 with acute limb ischemia s/p lytics.  Pharmacy has been consulted to optimize lipid-lowering therapy with the indication of secondary prevention for clinical ASCVD.  Recent Labs:  Lipid Panel (last 6 months):   Lab Results  Component Value Date   CHOL 132 05/16/2024   TRIG 68 05/16/2024   HDL 37 (L) 05/16/2024   CHOLHDL 3.6 05/16/2024   VLDL 14 05/16/2024   LDLCALC 82 05/16/2024    Hepatic function panel (last 6 months):   Lab Results  Component Value Date   AST 24 05/17/2024   ALT <5 05/17/2024   ALKPHOS 41 05/17/2024   BILITOT 0.3 05/17/2024    SCr (since admission):   Serum creatinine: 1.12 mg/dL (H) 87/78/74 9742 Estimated creatinine clearance: 29.7 mL/min (A)  Current therapy and lipid therapy tolerance Current lipid-lowering therapy: atorvastatin  80 mg daily and ezetimibe  10 mg daily Previous lipid-lowering therapies (if applicable): None Documented or reported allergies or intolerances to lipid-lowering therapies (if applicable): None  Assessment:   55 yoF with PVD and acute limb ischemia s/p lytic treatment. Patient has been on atorvastatin  80 mg daily. LDL 82. Ezetimibe  was added by VVS to further improve LDL.   Plan:   Continue atorvastatin  80 mg PO daily Continue ezetimibe  10 mg PO daily  Katie Alecxis Baltzell, PharmD PGY1 Pharmacy Resident

## 2024-05-17 NOTE — Progress Notes (Signed)
.. ° ° °  PROCEDURAL EXPEDITER PROGRESS NOTE  Patient Name: [name]  DOB:[birth year] Date of Admission: 05/13/2024  Date of Assessment:05/17/2024   -------------------------------------------------------------------------------------------------------------------   Brief clinical summary: Pt to OR on 05/18/24 for right above the knee amputation  Orders in place:  No   Communication with surgical team if no orders: IB MD for orders  Labs, test, and orders reviewed: Y - surgical pcr was done on 05/14/24  Requires surgical clearance:  No  Barriers noted: N/A   Intervention provided by [facility] team: N/A  Barrier resolved:  not applicable   -------------------------------------------------------------------------------------------------------------------  [employer], [place], [place] Please contact us  directly via secure chat (search for [facility]) or by calling us  at [phone] [facility]).

## 2024-05-17 NOTE — Progress Notes (Addendum)
 PHARMACY - ANTICOAGULATION/Antibiotic CONSULT NOTE  Pharmacy Consult for Heparin   Indication: vascular occlusion  Allergies[1]  Patient Measurements: Height: 5' 1 (154.9 cm) Weight: 50 kg (110 lb 3.7 oz) (from 04/27/24 office visit) IBW/kg (Calculated) : 47.8  Vital Signs: Temp: 98.7 F (37.1 C) (12/20 2002) Temp Source: (P) Oral (12/20 2351) BP: (P) 120/55 (12/20 2351) Pulse Rate: (P) 83 (12/20 2351)  Labs: Recent Labs    05/15/24 0142 05/15/24 0701 05/15/24 1354 05/15/24 2242 05/16/24 0754 05/17/24 0257  HGB 9.3*   < > 9.0*  --  7.8* 7.7*  HCT 30.0*   < > 28.4*  --  25.1* 23.9*  PLT 160   < > 119*  --  88* 69*  HEPARINUNFRC 0.19*   < > 0.56 0.25* 0.36 0.18*  CREATININE 1.22*  --   --   --  1.09* 1.12*   < > = values in this interval not displayed.    Estimated Creatinine Clearance: 29.7 mL/min (A) (by C-G formula based on SCr of 1.12 mg/dL (H)).  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 Heparin  for rule out vascular occlusion. Labs above reviewed. PTA meds reviewed. Pt s/p lysis, heparin  to resumed 6h after sheath removal (12/19 ~1105). Will target lower heparin  level goal. Potential amputation Monday, 12/22.   AM: heparin  level now subtherapeutic at 0.18 on 850 units/hr. Hgb low, stable in 7s, Plts continue to drop 88 > 69. Will continue to monitor. No issues with infusion or signs of bleeding per RN.   Goal of Therapy:  Heparin  level 0.3-0.5 units/ml Monitor platelets by anticoagulation protocol: Yes   Plan:  Increase heparin  gtt to 950 units/h (slight increase given Plt and Hgb) 8h heparin  level Check daily heparin  level CBC daily F/u plans AKA on Monday   Lynwood Poplar, PharmD, BCPS Clinical Pharmacist 05/17/2024 3:59 AM      [1]  Allergies Allergen Reactions   Penicillins Anaphylaxis, Swelling, Rash and Other (See Comments)    Immediate rash, facial/tongue/throat  swelling, SOB or lightheadedness with hypotension

## 2024-05-17 NOTE — Progress Notes (Addendum)
 PHARMACY - ANTICOAGULATION/Antibiotic CONSULT NOTE  Pharmacy Consult for Heparin   Indication: vascular occlusion  Allergies[1]  Patient Measurements: Height: 5' 1 (154.9 cm) Weight: 50 kg (110 lb 3.7 oz) (from 04/27/24 office visit) IBW/kg (Calculated) : 47.8  Vital Signs: Temp: 97.9 F (36.6 C) (12/21 1153) Temp Source: Oral (12/21 1153) BP: 121/71 (12/21 1153) Pulse Rate: 62 (12/21 1153)  Labs: Recent Labs    05/15/24 0142 05/15/24 0701 05/16/24 0754 05/17/24 0257 05/17/24 1156  HGB 9.3*   < > 7.8* 7.7* 9.6*  HCT 30.0*   < > 25.1* 23.9* 30.3*  PLT 160   < > 88* 69* 68*  HEPARINUNFRC 0.19*   < > 0.36 0.18* 0.36  CREATININE 1.22*  --  1.09* 1.12*  --    < > = values in this interval not displayed.    Estimated Creatinine Clearance: 29.7 mL/min (A) (by C-G formula based on SCr of 1.12 mg/dL (H)).  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 Heparin  for rule out vascular occlusion. Labs above reviewed. PTA meds reviewed. Pt s/p lysis, heparin  to resumed 6h after sheath removal (12/19 ~1105). Will target lower heparin  level goal. Potential amputation Monday, 12/22.   AM: heparin  level now therapeutic on 950 units/hr. Hgb low, stable in 7s, Plts continue to drop 88 > 68 (baseline before heparin  291) - low to intermediate risk of HIT. TRH and VVS MD made aware, VVS MD would like to continue heparin  for poor perfusion to limb. Will continue to monitor. No bleeding or pauses in infusion.   Goal of Therapy:  Heparin  level 0.3-0.5 units/ml Monitor platelets by anticoagulation protocol: Yes   Plan:  Continue heparin  950 units/hr 8h heparin  level Check daily heparin  level Monitor Hgb, PLT daily  Izetta Carl, PharmD PGY1 Pharmacy Resident       [1]  Allergies Allergen Reactions   Penicillins Anaphylaxis, Swelling, Rash and Other (See Comments)    Immediate rash, facial/tongue/throat  swelling, SOB or lightheadedness with hypotension

## 2024-05-17 NOTE — Progress Notes (Signed)
 "  PROGRESS NOTE    [name]  FMW:[id number] DOB: 27-Jun-1940 DOA: 05/13/2024 PCP: Zollie Lowers, MD   Brief Narrative: [name] is a 83 y.o. female with a history of hypertension, hyperlipidemia, CAD, CKD stage IIIa, HFpEF, stroke, COPD, renal artery stenosis, PAD, GERD, restless leg syndrome.  Patient presented secondary to foot and chest pain with evidence of critical limb ischemia involving her right lower extremity. Vascular surgery consulted. Patient underwent thrombolysis with plan for above knee amputation.   Assessment and Plan:  Acute limb ischemia of right foot Vascular surgery consulted. Patient underwent thrombolysis of the right lower extremity 12/18-12/19. Patient with obvious ischemic disease. -Vascular surgery recommendations: plan for above knee amputation, continue anticoagulation/antiplatelets -Continue meropenem  -Continue aspirin , Lipitor  and Plavix  per vascular surgery  Anemia of chronic kidney disease Acute blood loss anemia Baseline hemoglobin of 9-10  Acute thrombocytopenia Possibly related to blood loss. Patient is on heparin , however platelet drop coincides directly with her multiple vascular procedures. Patient is on aspirin  and Plavix  as well. -Trend CBC  Acute metabolic encephalopathy Cognitive impairment at baseline. Presumed secondary to infection. Intermittent confusion.  AKI on CKD stage IIIa Baseline creatinine of around 1.1-1.4. Creatinine of 2.65 on admission. AKI improved and renal function is back to baseline.  Peripheral vascular disease Noted. Patient is on aspirin  and Plavix  as an outpatient. -Continue aspirin  and Plavix  per vascular surgery  Cerebrovascular disease -Continue aspirin  and Plavix   Primary hypertension Prior to arrival medication(s) includes amlodipine .  Chronic HFpEF Noted and stable. Last LVEF of 55-60% with grade 1 diastolic dysjunction.  Hyperlipidemia -Continue Lipitor   Nonobstructive  CAD Noted. -Continue antiplatelet therapy -Continue Lipitor   COPD -Continue albuterol  and Breo Ellipta    DVT prophylaxis: Heparin  IV Code Status:   Code Status: Full Code Family Communication: None at bedside Disposition Plan: Discharge pending ongoing specialist recommendations   Consultants:  PCCM Vascular surgery  Procedures:  Vascular surgery procedure (12/18): Ultrasound-guided access of left common femoral artery Aortogram and  right leg angiogram Third order cannulation of right popliteal UniFuse catheter placement into right popliteal and SFA with initiation of lysis Vascular surgery procedure (12/19): Right lower extremity angiogram Percutaneous mechanical thrombectomy CAT 6 penumbra device superficial femoral artery, popliteal artery, tibioperoneal trunk, peroneal artery Viabahn stenting 6 x 25, 6 x 15, 6 x 5 superficial femoral artery, popliteal artery Balloon angioplasty tibioperoneal trunk, anterior tibial artery, posterior tibial artery Access manage using ProGlide device without issue No sedation medication administered  Antimicrobials: Vancomycin  Meropenem     Subjective: Patient reports no specific concerns today. She understands that she has to have an amputation of her right foot. When I was examining, she saw the state of her right foot for the first time.  Objective: BP 138/67 (BP Location: Left Arm)   Pulse 87   Temp 98.1 F (36.7 C) (Oral)   Resp 18   Ht 5' 1 (1.549 m)   Wt 50 kg Comment: from 04/27/24 office visit  SpO2 99%   BMI 20.83 kg/m   Examination:  General exam: Appears calm and comfortable. Respiratory system: Clear to auscultation. Respiratory effort normal. Cardiovascular system: S1 & S2 heard, RRR.  Gastrointestinal system: Abdomen is nondistended, soft and nontender. Normal bowel sounds heard. Central nervous system: Alert and oriented. No focal neurological deficits. Musculoskeletal: No calf tenderness. Right foot is  swollen with multiple areas of ischemia/gangrene Psychiatry: Judgement and insight appear normal. Mood & affect appropriate.    Data Reviewed: I have personally reviewed following labs and  imaging studies  CBC Lab Results  Component Value Date   WBC 9.9 05/17/2024   RBC 2.63 (L) 05/17/2024   HGB 7.7 (L) 05/17/2024   HCT 23.9 (L) 05/17/2024   MCV 90.9 05/17/2024   MCH 29.3 05/17/2024   PLT 69 (L) 05/17/2024   MCHC 32.2 05/17/2024   RDW 13.5 05/17/2024   LYMPHSABS 1.3 05/13/2024   MONOABS 0.6 05/13/2024   EOSABS 0.1 05/13/2024   BASOSABS 0.0 05/13/2024     Last metabolic panel Lab Results  Component Value Date   NA 139 05/17/2024   K 3.4 (L) 05/17/2024   CL 110 05/17/2024   CO2 23 05/17/2024   BUN 20 05/17/2024   CREATININE 1.12 (H) 05/17/2024   GLUCOSE 122 (H) 05/17/2024   GFRNONAA 49 (L) 05/17/2024   GFRAA 53 (L) 01/23/2020   CALCIUM  7.5 (L) 05/17/2024   PHOS 2.9 (L) 06/18/2023   PROT 4.5 (L) 05/17/2024   ALBUMIN 2.0 (L) 05/17/2024   LABGLOB 2.7 03/17/2024   AGRATIO 1.5 10/30/2022   BILITOT 0.3 05/17/2024   ALKPHOS 41 05/17/2024   AST 24 05/17/2024   ALT <5 05/17/2024   ANIONGAP 7 05/17/2024    GFR: Estimated Creatinine Clearance: 29.7 mL/min (A) (by C-G formula based on SCr of 1.12 mg/dL (H)).  Recent Results (from the past 240 hours)  Blood Culture (routine x 2)     Status: None (Preliminary result)   Collection Time: 05/13/24  3:20 AM   Specimen: BLOOD  Result Value Ref Range Status   Specimen Description BLOOD RIGHT ANTECUBITAL  Final   Special Requests   Final    BOTTLES DRAWN AEROBIC AND ANAEROBIC Blood Culture adequate volume   Culture   Final    NO GROWTH 4 DAYS Performed at [facility], [address] [place], [place] [address]    Report Status PENDING  Incomplete  Blood Culture (routine x 2)     Status: None (Preliminary result)   Collection Time: 05/13/24  4:00 AM   Specimen: BLOOD  Result Value Ref Range Status   Specimen Description  BLOOD BLOOD LEFT ARM  Final   Special Requests AEROBIC BOTTLE ONLY Blood Culture adequate volume  Final   Culture   Final    NO GROWTH 4 DAYS Performed at [facility], [address] [place], [place] [address]    Report Status PENDING  Incomplete  MRSA Next Gen by PCR, Nasal     Status: None   Collection Time: 05/14/24  9:44 AM   Specimen: Nasal Mucosa; Nasal Swab  Result Value Ref Range Status   MRSA by PCR Next Gen NOT DETECTED NOT DETECTED Final    Comment: (NOTE) The GeneXpert MRSA Assay (FDA approved for NASAL specimens only), is one component of a comprehensive MRSA colonization surveillance program. It is not intended to diagnose MRSA infection nor to guide or monitor treatment for MRSA infections. Test performance is not FDA approved in patients less than 26 years old. Performed at [facility], [address], [place], [place] [address]       Radiology Studies: No results found.    LOS: 4 days    Elgin Lam, MD [facility] 05/17/2024, 10:33 AM   If 7PM-7AM, please contact night-coverage [email or web address]  "

## 2024-05-17 NOTE — Progress Notes (Signed)
 PHARMACY - ANTICOAGULATION CONSULT NOTE  Pharmacy Consult for Heparin   Indication: vascular occlusion  Allergies[1]  Patient Measurements: Height: 5' 1 (154.9 cm) Weight: 50 kg (110 lb 3.7 oz) (from 04/27/24 office visit) IBW/kg (Calculated) : 47.8  Vital Signs: Temp: 97.3 F (36.3 C) (12/21 1915) Temp Source: Oral (12/21 1915) BP: 123/101 (12/21 1915) Pulse Rate: 97 (12/21 1915)  Labs: Recent Labs    05/15/24 0142 05/15/24 0701 05/16/24 0754 05/17/24 0257 05/17/24 1156 05/17/24 2106  HGB 9.3*   < > 7.8* 7.7* 9.6*  --   HCT 30.0*   < > 25.1* 23.9* 30.3*  --   PLT 160   < > 88* 69* 68*  --   HEPARINUNFRC 0.19*   < > 0.36 0.18* 0.36 0.32  CREATININE 1.22*  --  1.09* 1.12*  --   --    < > = values in this interval not displayed.    Estimated Creatinine Clearance: 29.7 mL/min (A) (by C-G formula based on SCr of 1.12 mg/dL (H)).  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 Heparin  for rule out vascular occlusion. Labs above reviewed. PTA meds reviewed. Pt s/p lysis, heparin  to resumed 6h after sheath removal (12/19 ~1105). Will target lower heparin  level goal. Potential amputation Monday, 12/22.   Heparin  level 0.32 therapeutic on heparin  drip rate 950 units/hr. Hgb low, stable in 7s, Plts continue to drop 88 > 68 (baseline before heparin  291) - MD aware -continue to monitor. No bleeding noted  Goal of Therapy:  Heparin  level 0.3-0.5 units/ml Monitor platelets by anticoagulation protocol: Yes   Plan:  Continue heparin  950 units/hr Check daily heparin  level Monitor Hgb, PLT daily    Olam Chalk Pharm.D. CPP, BCPS Clinical Pharmacist [phone] 05/17/2024 9:54 PM          [1]  Allergies Allergen Reactions   Penicillins Anaphylaxis, Swelling, Rash and Other (See Comments)    Immediate rash, facial/tongue/throat swelling, SOB or lightheadedness with hypotension

## 2024-05-17 NOTE — Progress Notes (Addendum)
 " Progress Note    05/17/2024 8:33 AM 2 Days Post-Op  Subjective: when asked about her right foot pain she said  its enough to let me know its there, also reporting left leg pain    Vitals:   05/17/24 0407 05/17/24 0820  BP: (!) 103/49 138/67  Pulse: 77 87  Resp: 18 18  Temp: 98.2 F (36.8 C) 98.1 F (36.7 C)  SpO2: 93% 99%   Physical Exam: Cardiac:  regular Lungs:  non labored  Incisions: Left femoral acces site c/d.I without swelling or hematoma Extremities:  Right foot with ischemic changes, bulli draining SS fluid Neurologic: alert and oriented to self  CBC    Component Value Date/Time   WBC 9.9 05/17/2024 0257   RBC 2.63 (L) 05/17/2024 0257   HGB 7.7 (L) 05/17/2024 0257   HGB 12.9 03/17/2024 0842   HCT 23.9 (L) 05/17/2024 0257   HCT 41.0 03/17/2024 0842   PLT 69 (L) 05/17/2024 0257   PLT 306 03/17/2024 0842   MCV 90.9 05/17/2024 0257   MCV 97 03/17/2024 0842   MCH 29.3 05/17/2024 0257   MCHC 32.2 05/17/2024 0257   RDW 13.5 05/17/2024 0257   RDW 12.2 03/17/2024 0842   LYMPHSABS 1.3 05/13/2024 0328   LYMPHSABS 1.4 03/17/2024 0842   MONOABS 0.6 05/13/2024 0328   EOSABS 0.1 05/13/2024 0328   EOSABS 0.2 03/17/2024 0842   BASOSABS 0.0 05/13/2024 0328   BASOSABS 0.0 03/17/2024 0842    BMET    Component Value Date/Time   NA 139 05/17/2024 0257   NA 146 (H) 03/17/2024 0842   K 3.4 (L) 05/17/2024 0257   CL 110 05/17/2024 0257   CO2 23 05/17/2024 0257   GLUCOSE 122 (H) 05/17/2024 0257   BUN 20 05/17/2024 0257   BUN 8 03/17/2024 0842   CREATININE 1.12 (H) 05/17/2024 0257   CALCIUM  7.5 (L) 05/17/2024 0257   GFRNONAA 49 (L) 05/17/2024 0257   GFRAA 53 (L) 01/23/2020 0700    INR    Component Value Date/Time   INR 1.0 05/13/2024 0328     Intake/Output Summary (Last 24 hours) at 05/17/2024 9166 Last data filed at 05/17/2024 0524 Gross per 24 hour  Intake 689.6 ml  Output 200 ml  Net 489.6 ml     Assessment/Plan:  83 y.o. female is s/p  Percutaneous mechanical thrombectomy of right SFA, pop, TPT and peroneal artery, Viabhan stenting of right SFA and popliteal with balloon angioplasty of the TPT, AT and PT 2 Days Post-Op   Not sure of her overall baseline mentation. She does answer questions appropriately but does exhibit some confusion and forgetfulness of prior discussions Despite reperfusion unfortunately her right foot is not salvageable Recommend right AKA Continue Heparin  On aspirin , Plavix , and statin Dr. Pearline has made multiple attempts to reach out to patients husband without success. We will continue to try to reach him Will keep her NPO after midnight tonight with plan for right AKA tomorrow in the OR with Dr. Gretta Teretha Damme, PA-C [facility] [phone] 05/17/2024 8:33 AM  VASCULAR STAFF ADDENDUM: I have independently interviewed and examined the patient. I agree with the above.  I tried multiple times yesterday to get in touch with her significant other [name].  He has not answered the phone.  We even called the neighbor from her phone today in the room.  The neighbor was going to go to their house to check on [place]. Regardless I do believe the  patient has capacity to make decisions.  She waxes and wanes but she is alert and oriented and is able to answer all orientation questions.  She also understands her condition.  She understands that even though we improved blood flow to her foot that the tissue was already too far gone and has not responded positively.  I offered above-knee amputation.  We discussed the risks and benefits and she elected to proceed. She will be scheduled for tomorrow with Dr. Gretta Norman GORMAN Pearline MD [facility] Phone Number: [phone] 05/17/2024 10:20 AM  "

## 2024-05-18 ENCOUNTER — Inpatient Hospital Stay (HOSPITAL_COMMUNITY): Admitting: Certified Registered Nurse Anesthetist

## 2024-05-18 ENCOUNTER — Other Ambulatory Visit: Payer: Self-pay

## 2024-05-18 ENCOUNTER — Encounter: Admitting: Podiatry

## 2024-05-18 ENCOUNTER — Encounter (HOSPITAL_COMMUNITY): Payer: Self-pay | Admitting: Internal Medicine

## 2024-05-18 ENCOUNTER — Encounter (HOSPITAL_COMMUNITY)
Admission: EM | Disposition: A | Discharge status: Skilled Nursing Facility | Payer: Self-pay | Source: Home / Self Care | Attending: Family Medicine

## 2024-05-18 DIAGNOSIS — E785 Hyperlipidemia, unspecified: Secondary | ICD-10-CM | POA: Diagnosis not present

## 2024-05-18 DIAGNOSIS — I2510 Atherosclerotic heart disease of native coronary artery without angina pectoris: Secondary | ICD-10-CM | POA: Diagnosis not present

## 2024-05-18 DIAGNOSIS — I96 Gangrene, not elsewhere classified: Secondary | ICD-10-CM

## 2024-05-18 DIAGNOSIS — I130 Hypertensive heart and chronic kidney disease with heart failure and stage 1 through stage 4 chronic kidney disease, or unspecified chronic kidney disease: Secondary | ICD-10-CM | POA: Diagnosis not present

## 2024-05-18 DIAGNOSIS — I739 Peripheral vascular disease, unspecified: Secondary | ICD-10-CM

## 2024-05-18 DIAGNOSIS — N1832 Chronic kidney disease, stage 3b: Secondary | ICD-10-CM | POA: Diagnosis not present

## 2024-05-18 DIAGNOSIS — T8140XA Infection following a procedure, unspecified, initial encounter: Secondary | ICD-10-CM | POA: Diagnosis not present

## 2024-05-18 DIAGNOSIS — I70201 Unspecified atherosclerosis of native arteries of extremities, right leg: Secondary | ICD-10-CM

## 2024-05-18 DIAGNOSIS — I509 Heart failure, unspecified: Secondary | ICD-10-CM

## 2024-05-18 DIAGNOSIS — J4489 Other specified chronic obstructive pulmonary disease: Secondary | ICD-10-CM | POA: Diagnosis not present

## 2024-05-18 HISTORY — PX: AMPUTATION: SHX166

## 2024-05-18 LAB — CBC
HCT: 26.3 % — ABNORMAL LOW (ref 36.0–46.0)
Hemoglobin: 8.3 g/dL — ABNORMAL LOW (ref 12.0–15.0)
MCH: 28.5 pg (ref 26.0–34.0)
MCHC: 31.6 g/dL (ref 30.0–36.0)
MCV: 90.4 fL (ref 80.0–100.0)
Platelets: 65 K/uL — ABNORMAL LOW (ref 150–400)
RBC: 2.91 MIL/uL — ABNORMAL LOW (ref 3.87–5.11)
RDW: 13.6 % (ref 11.5–15.5)
WBC: 8.9 K/uL (ref 4.0–10.5)
nRBC: 0 % (ref 0.0–0.2)

## 2024-05-18 LAB — CULTURE, BLOOD (ROUTINE X 2)
Culture: NO GROWTH
Culture: NO GROWTH
Special Requests: ADEQUATE
Special Requests: ADEQUATE

## 2024-05-18 LAB — GLUCOSE, CAPILLARY
Glucose-Capillary: 92 mg/dL (ref 70–99)
Glucose-Capillary: 58 mg/dL — ABNORMAL LOW (ref 70–99)
Glucose-Capillary: 85 mg/dL (ref 70–99)
Glucose-Capillary: 81 mg/dL (ref 70–99)
Glucose-Capillary: 118 mg/dL — ABNORMAL HIGH (ref 70–99)
Glucose-Capillary: 59 mg/dL — ABNORMAL LOW (ref 70–99)
Glucose-Capillary: 114 mg/dL — ABNORMAL HIGH (ref 70–99)

## 2024-05-18 LAB — HEPARIN LEVEL (UNFRACTIONATED): Heparin Unfractionated: 0.55 [IU]/mL (ref 0.30–0.70)

## 2024-05-18 LAB — TYPE AND SCREEN
ABO/RH(D): B POS
Antibody Screen: NEGATIVE

## 2024-05-18 SURGERY — AMPUTATION, ABOVE KNEE
Anesthesia: General | Site: Knee | Laterality: Right

## 2024-05-18 MED ORDER — PROPOFOL 10 MG/ML IV BOLUS
INTRAVENOUS | Status: AC
  Filled 2024-05-18: qty 20

## 2024-05-18 MED ORDER — ONDANSETRON HCL 4 MG/2ML IJ SOLN
INTRAMUSCULAR | Status: AC
  Filled 2024-05-18: qty 2

## 2024-05-18 MED ORDER — FENTANYL CITRATE (PF) 250 MCG/5ML IJ SOLN
INTRAMUSCULAR | Status: DC | PRN
  Administered 2024-05-18 (×2): 25 ug via INTRAVENOUS

## 2024-05-18 MED ORDER — HEPARIN (PORCINE) 25000 UT/250ML-% IV SOLN
900.0000 [IU]/h | INTRAVENOUS | Status: DC
  Administered 2024-05-19: 900 [IU]/h via INTRAVENOUS
  Administered 2024-05-20: 800 [IU]/h via INTRAVENOUS
  Administered 2024-05-21: 900 [IU]/h via INTRAVENOUS
  Filled 2024-05-18 (×3): qty 250

## 2024-05-18 MED ORDER — OXYCODONE HCL 5 MG PO TABS: 5.0000 mg | ORAL_TABLET | Freq: Once | ORAL | Status: DC | PRN

## 2024-05-18 MED ORDER — DEXAMETHASONE SOD PHOSPHATE PF 10 MG/ML IJ SOLN
INTRAMUSCULAR | Status: DC | PRN
  Administered 2024-05-18: 10 mg via INTRAVENOUS

## 2024-05-18 MED ORDER — CHLORHEXIDINE GLUCONATE 0.12 % MT SOLN
OROMUCOSAL | Status: AC
  Administered 2024-05-18: 15 mL via OROMUCOSAL
  Filled 2024-05-18: qty 15

## 2024-05-18 MED ORDER — 0.9 % SODIUM CHLORIDE (POUR BTL) OPTIME
TOPICAL | Status: DC | PRN
  Administered 2024-05-18: 1000 mL

## 2024-05-18 MED ORDER — CHLORHEXIDINE GLUCONATE 0.12 % MT SOLN: 15.0000 mL | Freq: Once | OROMUCOSAL | Status: AC

## 2024-05-18 MED ORDER — SUGAMMADEX SODIUM 200 MG/2ML IV SOLN
INTRAVENOUS | Status: DC | PRN
  Administered 2024-05-18 (×4): 50 mg via INTRAVENOUS

## 2024-05-18 MED ORDER — ONDANSETRON HCL 4 MG/2ML IJ SOLN
INTRAMUSCULAR | Status: DC | PRN
  Administered 2024-05-18: 4 mg via INTRAVENOUS

## 2024-05-18 MED ORDER — OXYCODONE HCL 5 MG/5ML PO SOLN: 5.0000 mg | Freq: Once | ORAL | Status: DC | PRN

## 2024-05-18 MED ORDER — LIDOCAINE 2% (20 MG/ML) 5 ML SYRINGE
INTRAMUSCULAR | Status: DC | PRN
  Administered 2024-05-18: 40 mg via INTRAVENOUS

## 2024-05-18 MED ORDER — FENTANYL CITRATE (PF) 100 MCG/2ML IJ SOLN
INTRAMUSCULAR | Status: AC
  Filled 2024-05-18: qty 2

## 2024-05-18 MED ORDER — DEXTROSE 50 % IV SOLN
25.0000 mL | Freq: Once | INTRAVENOUS | Status: AC
  Administered 2024-05-18: 25 mL via INTRAVENOUS

## 2024-05-18 MED ORDER — SUGAMMADEX SODIUM 200 MG/2ML IV SOLN
INTRAVENOUS | Status: AC
  Filled 2024-05-18: qty 2

## 2024-05-18 MED ORDER — LIDOCAINE 2% (20 MG/ML) 5 ML SYRINGE
INTRAMUSCULAR | Status: AC
  Filled 2024-05-18: qty 5

## 2024-05-18 MED ORDER — DEXTROSE 50 % IV SOLN
INTRAVENOUS | Status: AC
  Administered 2024-05-18: 50 mL
  Filled 2024-05-18: qty 50

## 2024-05-18 MED ORDER — ACETAMINOPHEN 10 MG/ML IV SOLN: 1000.0000 mg | Freq: Once | INTRAVENOUS | Status: DC | PRN

## 2024-05-18 MED ORDER — ROCURONIUM BROMIDE 10 MG/ML (PF) SYRINGE
PREFILLED_SYRINGE | INTRAVENOUS | Status: AC
  Filled 2024-05-18: qty 10

## 2024-05-18 MED ORDER — DEXTROSE 50 % IV SOLN
INTRAVENOUS | Status: AC
  Filled 2024-05-18: qty 50

## 2024-05-18 MED ORDER — ACETAMINOPHEN 10 MG/ML IV SOLN
INTRAVENOUS | Status: DC | PRN
  Administered 2024-05-18: 1000 mg via INTRAVENOUS

## 2024-05-18 MED ORDER — PROPOFOL 10 MG/ML IV BOLUS
INTRAVENOUS | Status: DC | PRN
  Administered 2024-05-18: 10 mg via INTRAVENOUS
  Administered 2024-05-18: 50 mg via INTRAVENOUS

## 2024-05-18 MED ORDER — LACTATED RINGERS IV SOLN: INTRAVENOUS | Status: DC

## 2024-05-18 MED ORDER — FENTANYL CITRATE (PF) 100 MCG/2ML IJ SOLN: 25.0000 ug | INTRAMUSCULAR | Status: DC | PRN

## 2024-05-18 MED ORDER — ROCURONIUM BROMIDE 10 MG/ML (PF) SYRINGE
PREFILLED_SYRINGE | INTRAVENOUS | Status: DC | PRN
  Administered 2024-05-18: 50 mg via INTRAVENOUS

## 2024-05-18 MED ORDER — PHENYLEPHRINE 80 MCG/ML (10ML) SYRINGE FOR IV PUSH (FOR BLOOD PRESSURE SUPPORT)
PREFILLED_SYRINGE | INTRAVENOUS | Status: DC | PRN
  Administered 2024-05-18: 40 ug via INTRAVENOUS
  Administered 2024-05-18: 80 ug via INTRAVENOUS

## 2024-05-18 MED ORDER — ORAL CARE MOUTH RINSE: 15.0000 mL | Freq: Once | OROMUCOSAL | Status: AC

## 2024-05-18 MED ORDER — PHENYLEPHRINE 80 MCG/ML (10ML) SYRINGE FOR IV PUSH (FOR BLOOD PRESSURE SUPPORT)
PREFILLED_SYRINGE | INTRAVENOUS | Status: AC
  Filled 2024-05-18: qty 10

## 2024-05-18 SURGICAL SUPPLY — 45 items
BAG COUNTER SPONGE SURGICOUNT (BAG) ×1 IMPLANT
BLADE SAW SGTL 73X25 THK (BLADE) ×1 IMPLANT
BNDG COHESIVE 6X5 TAN ST LF (GAUZE/BANDAGES/DRESSINGS) ×1 IMPLANT
BNDG ELASTIC 4INX 5YD STR LF (GAUZE/BANDAGES/DRESSINGS) IMPLANT
BNDG ELASTIC 4X5.8 VLCR STR LF (GAUZE/BANDAGES/DRESSINGS) ×1 IMPLANT
BNDG ELASTIC 6INX 5YD STR LF (GAUZE/BANDAGES/DRESSINGS) ×1 IMPLANT
BNDG GAUZE DERMACEA FLUFF 4 (GAUZE/BANDAGES/DRESSINGS) ×2 IMPLANT
BUR DISC 0.8X25 (BURR) IMPLANT
CANISTER SUCTION 3000ML PPV (SUCTIONS) ×1 IMPLANT
CLIP TI MEDIUM 6 (CLIP) ×1 IMPLANT
COVER BACK TABLE 60X90IN (DRAPES) ×1 IMPLANT
COVER SURGICAL LIGHT HANDLE (MISCELLANEOUS) ×2 IMPLANT
DRAIN CHANNEL 19F RND (DRAIN) IMPLANT
DRAPE DERMATAC (DRAPES) IMPLANT
DRAPE HALF SHEET 40X57 (DRAPES) ×1 IMPLANT
DRAPE INCISE IOBAN 66X45 STRL (DRAPES) IMPLANT
DRAPE SURG ORHT 6 SPLT 77X108 (DRAPES) ×2 IMPLANT
DRAPE U-SHAPE 47X51 STRL (DRAPES) IMPLANT
DRESSING PREVENA PLUS CUSTOM (GAUZE/BANDAGES/DRESSINGS) IMPLANT
DRSG ADAPTIC 3X8 NADH LF (GAUZE/BANDAGES/DRESSINGS) ×1 IMPLANT
ELECT CAUTERY BLADE 6.4 (BLADE) ×1 IMPLANT
ELECTRODE REM PT RTRN 9FT ADLT (ELECTROSURGICAL) ×1 IMPLANT
EVACUATOR SILICONE 100CC (DRAIN) IMPLANT
GAUZE SPONGE 4X4 12PLY STRL (GAUZE/BANDAGES/DRESSINGS) ×2 IMPLANT
GLOVE BIO SURGEON STRL SZ7.5 (GLOVE) ×1 IMPLANT
GLOVE BIOGEL PI IND STRL 8 (GLOVE) ×1 IMPLANT
GOWN STRL REUS W/ TWL XL LVL3 (GOWN DISPOSABLE) ×1 IMPLANT
KIT BASIN OR (CUSTOM PROCEDURE TRAY) ×1 IMPLANT
KIT TURNOVER KIT B (KITS) ×1 IMPLANT
PACK GENERAL/GYN (CUSTOM PROCEDURE TRAY) ×1 IMPLANT
PAD ARMBOARD POSITIONER FOAM (MISCELLANEOUS) ×2 IMPLANT
PREVENA RESTOR ARTHOFORM 46X30 (CANNISTER) IMPLANT
SOLN 0.9% NACL POUR BTL 1000ML (IV SOLUTION) ×1 IMPLANT
SOLN STERILE WATER BTL 1000 ML (IV SOLUTION) ×1 IMPLANT
STAPLER SKIN PROX 35W (STAPLE) ×1 IMPLANT
STOCKINETTE IMPERVIOUS LG (DRAPES) ×1 IMPLANT
SUT BONE WAX W31G (SUTURE) ×1 IMPLANT
SUT ETHILON 3 0 PS 1 (SUTURE) IMPLANT
SUT SILK 0 TIES 10X30 (SUTURE) ×1 IMPLANT
SUT SILK 2 0 SH CR/8 (SUTURE) ×1 IMPLANT
SUT SILK 2-0 18XBRD TIE 12 (SUTURE) ×1 IMPLANT
SUT VIC AB 2-0 CT1 18 (SUTURE) ×2 IMPLANT
SUT VIC AB 3-0 SH 18 (SUTURE) IMPLANT
TOWEL GREEN STERILE (TOWEL DISPOSABLE) ×2 IMPLANT
UNDERPAD 30X36 HEAVY ABSORB (UNDERPADS AND DIAPERS) ×1 IMPLANT

## 2024-05-18 NOTE — Progress Notes (Signed)
 [facility] [place]  Subjective  -states she understands right AKA today   Objective (!) 123/49 77 98.8 F (37.1 C) (Oral) 16 96%  Intake/Output Summary (Last 24 hours) at 05/18/2024 1319 Last data filed at 05/18/2024 9377 Gross per 24 hour  Intake --  Output 400 ml  Net -400 ml    Right foot with nonviable tissue and necrotic toes  Laboratory Lab Results: Recent Labs    05/17/24 1156 05/18/24 0429  WBC 10.0 8.9  HGB 9.6* 8.3*  HCT 30.3* 26.3*  PLT 68* 65*   BMET Recent Labs    05/16/24 0754 05/17/24 0257  NA 141 139  K 3.6 3.4*  CL 111 110  CO2 22 23  GLUCOSE 114* 122*  BUN 20 20  CREATININE 1.09* 1.12*  CALCIUM  7.5* 7.5*    COAG Lab Results  Component Value Date   INR 1.0 05/13/2024   INR 0.9 03/08/2024   INR 0.9 06/06/2007   No results found for: PTT  Assessment/Planning:  Plan right above-knee amputation for nonviable ischemic extremity.  Patient is agreeable to proceed.  I offered to call family and she states no one to touch base with at this time.  [name] 05/18/2024 1:19 PM -- [name]

## 2024-05-18 NOTE — Transfer of Care (Signed)
 Immediate Anesthesia Transfer of Care Note  Patient: [name]  Procedure(s) Performed: AMPUTATION, ABOVE KNEE (Right: Knee)  Patient Location: PACU  Anesthesia Type:General  Level of Consciousness: awake, alert , oriented, patient cooperative, and responds to stimulation  Airway & Oxygen Therapy: Patient Spontanous Breathing  Post-op Assessment: Report given to RN and Post -op Vital signs reviewed and stable  Post vital signs: Reviewed and stable  Last Vitals:  Vitals Value Taken Time  BP 150/62 05/18/24 14:47  Temp    Pulse 91 05/18/24 14:49  Resp 16 05/18/24 14:49  SpO2 99 % 05/18/24 14:49  Vitals shown include unfiled device data.  Last Pain:  Vitals:   05/18/24 1208  TempSrc: Oral  PainSc: 0-No pain      Patients Stated Pain Goal: 2 (05/17/24 2132)  Complications: No notable events documented.

## 2024-05-18 NOTE — Plan of Care (Signed)
  Problem: Education: Goal: Knowledge of General Education information will improve Description: Including pain rating scale, medication(s)/side effects and non-pharmacologic comfort measures Outcome: Progressing   Problem: Health Behavior/Discharge Planning: Goal: Ability to manage health-related needs will improve Outcome: Progressing   Problem: Clinical Measurements: Goal: Ability to maintain clinical measurements within normal limits will improve Outcome: Progressing Goal: Will remain free from infection Outcome: Progressing Goal: Diagnostic test results will improve Outcome: Progressing Goal: Respiratory complications will improve Outcome: Progressing Goal: Cardiovascular complication will be avoided Outcome: Progressing   Problem: Activity: Goal: Risk for activity intolerance will decrease Outcome: Progressing   Problem: Nutrition: Goal: Adequate nutrition will be maintained Outcome: Progressing   Problem: Coping: Goal: Level of anxiety will decrease Outcome: Progressing   Problem: Elimination: Goal: Will not experience complications related to bowel motility Outcome: Progressing Goal: Will not experience complications related to urinary retention Outcome: Progressing   Problem: Pain Managment: Goal: General experience of comfort will improve and/or be controlled Outcome: Progressing   Problem: Safety: Goal: Ability to remain free from injury will improve Outcome: Progressing   Problem: Skin Integrity: Goal: Risk for impaired skin integrity will decrease Outcome: Progressing   Problem: Education: Goal: Understanding of CV disease, CV risk reduction, and recovery process will improve Outcome: Progressing Goal: Individualized Educational Video(s) Outcome: Progressing   Problem: Activity: Goal: Ability to return to baseline activity level will improve Outcome: Progressing   Problem: Cardiovascular: Goal: Ability to achieve and maintain adequate  cardiovascular perfusion will improve Outcome: Progressing Goal: Vascular access site(s) Level 0-1 will be maintained Outcome: Progressing   Problem: Health Behavior/Discharge Planning: Goal: Ability to safely manage health-related needs after discharge will improve Outcome: Progressing   Problem: Education: Goal: Knowledge of the prescribed therapeutic regimen will improve Outcome: Progressing Goal: Ability to verbalize activity precautions or restrictions will improve Outcome: Progressing Goal: Understanding of discharge needs will improve Outcome: Progressing   Problem: Activity: Goal: Ability to perform//tolerate increased activity and mobilize with assistive devices will improve Outcome: Progressing   Problem: Clinical Measurements: Goal: Postoperative complications will be avoided or minimized Outcome: Progressing   Problem: Self-Care: Goal: Ability to meet self-care needs will improve Outcome: Progressing   Problem: Self-Concept: Goal: Ability to maintain and perform role responsibilities to the fullest extent possible will improve Outcome: Progressing   Problem: Pain Management: Goal: Pain level will decrease with appropriate interventions Outcome: Progressing   Problem: Skin Integrity: Goal: Demonstration of wound healing without infection will improve Outcome: Progressing

## 2024-05-18 NOTE — [place] FL2 (Signed)
 " [place]  MEDICAID FL2 LEVEL OF CARE FORM     IDENTIFICATION  Patient Name: [name] Birthdate: 03/29/1941 Sex: female Admission Date (Current Location): 05/13/2024  [facility] and [place] Number:  [employer] and Address:  [facility] [place], [address] [place], [place] [address]      Provider Number: [id number]  Attending Physician Name and Address:  Briana Elgin LABOR, MD  Relative Name and Phone Number:       Current Level of Care: Hospital Recommended Level of Care: Skilled Nursing Facility Prior Approval Number:    Date Approved/Denied:   PASRR Number: [id number]  Discharge Plan: SNF    Current Diagnoses: Patient Active Problem List   Diagnosis Date Noted   Rt Foot Postoperative infection, unspecified type, initial encounter 05/13/2024   Gangrene of toe of right foot (HCC) 04/13/2024   Peripheral artery disease 04/13/2024   Acute pancreatitis 03/08/2024   Lumbar radiculopathy 09/05/2023   History of CVA (cerebrovascular accident) 07/15/2023   B12 deficiency 09/20/2022   Hypertensive urgency 09/16/2022   Left renal artery stenosis 09/16/2022   GERD (gastroesophageal reflux disease) 03/25/2022   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   Unintentional weight loss 03/19/2022   PAD (peripheral artery disease) -Atherosclerotic ulcer of aorta -posterior aspect of the descending aorta measuring up to 4 mm in thickness 01/24/2020   Tobacco abuse 01/23/2020   PAD/Atherosclerotic ulcer of aorta -posterior aspect of the descending aorta measuring up to 4 mm in thickness 01/22/2020   GAD (generalized anxiety disorder) 08/26/2019   Osteopenia of multiple sites 08/26/2019   Depression, recurrent 04/21/2019   Stage 3b chronic kidney disease (HCC) 04/21/2019   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   COPD with chronic bronchitis (HCC) 01/01/2019   Hyperlipidemia LDL goal <70 07/09/2016   History of TIA (transient ischemic attack)  03/06/2016   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HCC) 02/13/2012   Left-sided weakness 02/11/2012   Essential hypertension with goal blood pressure less than 130/80 02/11/2012   CALCANEAL SPUR 10/05/2009    Orientation RESPIRATION BLADDER Height & Weight     Self, Time, Situation, Place  Normal Incontinent Weight: 110 lb 3.7 oz (50 kg) (from 04/27/24 office visit) Height:  5' 1 (154.9 cm)  BEHAVIORAL SYMPTOMS/MOOD NEUROLOGICAL BOWEL NUTRITION STATUS      Continent Diet (please see d/c summary)  AMBULATORY STATUS COMMUNICATION OF NEEDS Skin   Extensive Assist Verbally Surgical wounds (RT AKA)                       Personal Care Assistance Level of Assistance  Bathing, Feeding, Dressing Bathing Assistance: Limited assistance Feeding assistance: Independent Dressing Assistance: Limited assistance     Functional Limitations Info  Sight, Hearing, Speech Sight Info: Adequate Hearing Info: Adequate      SPECIAL CARE FACTORS FREQUENCY  PT (By licensed PT), OT (By licensed OT)     PT Frequency: 5x per week OT Frequency: 5x per week            Contractures Contractures Info: Not present    Additional Factors Info  Code Status, Allergies Code Status Info: FULL Allergies Info: Penicillins High Allergy Anaphylaxis, Swelling, Rash, Other (See Comments) Immediate rash, facial/tongue/throat swelling, SOB or lightheadedness with hypotension           Current Medications (05/18/2024):  This is the current hospital active medication list Current Facility-Administered Medications  Medication Dose Route Frequency Provider Last Rate Last Admin   [  MAR Hold] acetaminophen  (TYLENOL ) tablet 650 mg  650 mg Oral Q6H PRN Pearlean, Courage, MD       Or   ILDA Hold] acetaminophen  (TYLENOL ) suppository 650 mg  650 mg Rectal Q6H PRN Pearlean, Courage, MD       [MAR Hold] acetaminophen  (TYLENOL ) tablet 650 mg  650 mg Oral Q4H PRN Robins, Joshua E, MD       [MAR  Hold] albuterol  (PROVENTIL ) (2.5 MG/3ML) 0.083% nebulizer solution 2.5 mg  2.5 mg Nebulization Q2H PRN Emokpae, Courage, MD       [MAR Hold] aspirin  EC tablet 81 mg  81 mg Oral Q breakfast Emokpae, Courage, MD   81 mg at 05/18/24 0804   [MAR Hold] atorvastatin  (LIPITOR ) tablet 80 mg  80 mg Oral Daily Emokpae, Courage, MD   80 mg at 05/18/24 0803   [MAR Hold] bisacodyl  (DULCOLAX) suppository 10 mg  10 mg Rectal Daily PRN Emokpae, Courage, MD       [MAR Hold] clopidogrel  (PLAVIX ) tablet 75 mg  75 mg Oral Daily Emokpae, Courage, MD   75 mg at 05/18/24 0803   [MAR Hold] ezetimibe  (ZETIA ) tablet 10 mg  10 mg Oral Daily Danford, Christopher P, MD   10 mg at 05/18/24 0803   [MAR Hold] feeding supplement (ENSURE PLUS HIGH PROTEIN) liquid 237 mL  237 mL Oral BID BM Danford, Christopher P, MD   237 mL at 05/17/24 1315   [MAR Hold] fluticasone  furoate-vilanterol (BREO ELLIPTA ) 200-25 MCG/ACT 1 puff  1 puff Inhalation Daily Emokpae, Courage, MD   1 puff at 05/18/24 0820   heparin  ADULT infusion 100 units/mL (25000 units/250mL)  900 Units/hr Intravenous Continuous Sherryll Suzen SQUIBB, RPH   Stopped at 05/18/24 1200   [MAR Hold] hydrALAZINE  (APRESOLINE ) injection 10 mg  10 mg Intravenous Q6H PRN Emokpae, Courage, MD   10 mg at 05/13/24 1751   [MAR Hold] HYDROmorphone  (DILAUDID ) injection 0.5 mg  0.5 mg Intravenous Q2H PRN Jonel Lonni SQUIBB, MD   0.5 mg at 05/17/24 2132   [MAR Hold] labetalol  (NORMODYNE ) injection 10 mg  10 mg Intravenous Q10 min PRN Robins, Joshua E, MD       lactated ringers  infusion   Intravenous Continuous Lucious Debby BRAVO, MD 10 mL/hr at 05/18/24 1217 New Bag at 05/18/24 1217   [MAR Hold] meropenem  (MERREM ) 1 g in sodium chloride  0.9 % 100 mL IVPB  1 g Intravenous Q12H Bitonti, Michael T, RPH 200 mL/hr at 05/18/24 0814 1 g at 05/18/24 0814   [MAR Hold] morphine  (PF) 2 MG/ML injection 5 mg  5 mg Intravenous Q1H PRN Pearline Norman RAMAN, MD       [facility] Hold] naloxone  (NARCAN ) injection 0.1 mg  0.1  mg Intravenous PRN Stretch, Robert J, MD   0.1 mg at 05/15/24 1330   [MAR Hold] ondansetron  (ZOFRAN ) tablet 4 mg  4 mg Oral Q6H PRN Pearlean Manus, MD       Or   ILDA Hold] ondansetron  (ZOFRAN ) injection 4 mg  4 mg Intravenous Q6H PRN Emokpae, Courage, MD       [MAR Hold] oxyCODONE  (Oxy IR/ROXICODONE ) immediate release tablet 5 mg  5 mg Oral Q4H PRN Emokpae, Courage, MD   5 mg at 05/16/24 2152   [facility] Hold] pantoprazole  (PROTONIX ) EC tablet 40 mg  40 mg Oral Daily Emokpae, Courage, MD   40 mg at 05/18/24 0804   [MAR Hold] polyethylene glycol (MIRALAX  / GLYCOLAX ) packet 17 g  17 g Oral Daily PRN Emokpae, Courage,  MD       [MAR Hold] pregabalin  (LYRICA ) capsule 300 mg  300 mg Oral QHS Emokpae, Courage, MD   300 mg at 05/17/24 2114   [facility] Hold] rOPINIRole  (REQUIP ) tablet 1 mg  1 mg Oral QHS Emokpae, Courage, MD   1 mg at 05/17/24 2114   [facility] Hold] sodium chloride  flush (NS) 0.9 % injection 3 mL  3 mL Intravenous Q12H Emokpae, Courage, MD   3 mL at 05/18/24 0819   [MAR Hold] traZODone  (DESYREL ) tablet 50 mg  50 mg Oral QHS PRN Pearlean Manus, MD         Discharge Medications: Please see discharge summary for a list of discharge medications.  Relevant Imaging Results:  Relevant Lab Results:   Additional Information SSN [id number]  Tabitha LOISE Louder, LCSW     "

## 2024-05-18 NOTE — Progress Notes (Signed)
 "  PROGRESS NOTE    [name]  FMW:[id number] DOB: 08-15-1940 DOA: 05/13/2024 PCP: Zollie Lowers, MD   Brief Narrative: [name] is a 83 y.o. female with a history of hypertension, hyperlipidemia, CAD, CKD stage IIIa, HFpEF, stroke, COPD, renal artery stenosis, PAD, GERD, restless leg syndrome.  Patient presented secondary to foot and chest pain with evidence of critical limb ischemia involving her right lower extremity. Vascular surgery consulted. Patient underwent thrombolysis with plan for above knee amputation.   Assessment and Plan:  Acute limb ischemia of right foot Vascular surgery consulted. Patient underwent thrombolysis of the right lower extremity 12/18-12/19. Patient with obvious ischemic disease. -Vascular surgery recommendations: plan for above knee amputation (12/22), continue anticoagulation/antiplatelets -Continue meropenem  -Continue aspirin , Lipitor  and Plavix  per vascular surgery  Anemia of chronic kidney disease Acute blood loss anemia Baseline hemoglobin of 9-10. Drift down to 8.3 today. -CBC in AM  Acute thrombocytopenia Possibly related to blood loss. Patient is on heparin , however platelet drop coincides directly with her multiple vascular procedures. Patient is on aspirin  and Plavix  as well. Appears to have stabilized. -Trend CBC  Acute metabolic encephalopathy Cognitive impairment at baseline. Presumed secondary to infection. Intermittent confusion.  AKI on CKD stage IIIa Baseline creatinine of around 1.1-1.4. Creatinine of 2.65 on admission. AKI improved and renal function is back to baseline.  Peripheral vascular disease Noted. Patient is on aspirin  and Plavix  as an outpatient. -Continue aspirin  and Plavix  per vascular surgery  Cerebrovascular disease -Continue aspirin  and Plavix   Primary hypertension Prior to arrival medication(s) includes amlodipine .  Chronic HFpEF Noted and stable. Last LVEF of 55-60% with grade 1 diastolic  dysjunction.  Hyperlipidemia -Continue Lipitor   Nonobstructive CAD Noted. -Continue antiplatelet therapy -Continue Lipitor   COPD -Continue albuterol  and Breo Ellipta    DVT prophylaxis: Heparin  IV Code Status:   Code Status: Full Code Family Communication: None at bedside Disposition Plan: Discharge pending ongoing specialist recommendations   Consultants:  PCCM Vascular surgery  Procedures:  Vascular surgery procedure (12/18): Ultrasound-guided access of left common femoral artery Aortogram and  right leg angiogram Third order cannulation of right popliteal UniFuse catheter placement into right popliteal and SFA with initiation of lysis Vascular surgery procedure (12/19): Right lower extremity angiogram Percutaneous mechanical thrombectomy CAT 6 penumbra device superficial femoral artery, popliteal artery, tibioperoneal trunk, peroneal artery Viabahn stenting 6 x 25, 6 x 15, 6 x 5 superficial femoral artery, popliteal artery Balloon angioplasty tibioperoneal trunk, anterior tibial artery, posterior tibial artery Access manage using ProGlide device without issue No sedation medication administered  Antimicrobials: Vancomycin  Meropenem     Subjective: No issues today. Patient voices understanding of need for amputation and is fully on board with plan. No concerns today.  Objective: BP (!) 136/59 (BP Location: Left Arm)   Pulse 69   Temp 97.8 F (36.6 C) (Oral)   Resp 11   Ht 5' 1 (1.549 m)   Wt 50 kg Comment: from 04/27/24 office visit  SpO2 100%   BMI 20.83 kg/m   Examination:  General exam: Appears calm and comfortable. Respiratory system: Clear to auscultation. Respiratory effort normal. Cardiovascular system: S1 & S2 heard, RRR.  Gastrointestinal system: Abdomen is nondistended, soft and nontender. Normal bowel sounds heard. Central nervous system: Alert and oriented. No focal neurological deficits. Musculoskeletal: No calf tenderness. Right foot is  swollen with multiple areas of ischemia/gangrene in addition to bullae.   Data Reviewed: I have personally reviewed following labs and imaging studies  CBC Lab Results  Component  Value Date   WBC 8.9 05/18/2024   RBC 2.91 (L) 05/18/2024   HGB 8.3 (L) 05/18/2024   HCT 26.3 (L) 05/18/2024   MCV 90.4 05/18/2024   MCH 28.5 05/18/2024   PLT 65 (L) 05/18/2024   MCHC 31.6 05/18/2024   RDW 13.6 05/18/2024   LYMPHSABS 1.3 05/13/2024   MONOABS 0.6 05/13/2024   EOSABS 0.1 05/13/2024   BASOSABS 0.0 05/13/2024     Last metabolic panel Lab Results  Component Value Date   NA 139 05/17/2024   K 3.4 (L) 05/17/2024   CL 110 05/17/2024   CO2 23 05/17/2024   BUN 20 05/17/2024   CREATININE 1.12 (H) 05/17/2024   GLUCOSE 122 (H) 05/17/2024   GFRNONAA 49 (L) 05/17/2024   GFRAA 53 (L) 01/23/2020   CALCIUM  7.5 (L) 05/17/2024   PHOS 2.9 (L) 06/18/2023   PROT 4.5 (L) 05/17/2024   ALBUMIN 2.0 (L) 05/17/2024   LABGLOB 2.7 03/17/2024   AGRATIO 1.5 10/30/2022   BILITOT 0.3 05/17/2024   ALKPHOS 41 05/17/2024   AST 24 05/17/2024   ALT <5 05/17/2024   ANIONGAP 7 05/17/2024    GFR: Estimated Creatinine Clearance: 29.7 mL/min (A) (by C-G formula based on SCr of 1.12 mg/dL (H)).  Recent Results (from the past 240 hours)  Blood Culture (routine x 2)     Status: None   Collection Time: 05/13/24  3:20 AM   Specimen: BLOOD  Result Value Ref Range Status   Specimen Description BLOOD RIGHT ANTECUBITAL  Final   Special Requests   Final    BOTTLES DRAWN AEROBIC AND ANAEROBIC Blood Culture adequate volume   Culture   Final    NO GROWTH 5 DAYS Performed at [facility], [address], [place], [place] [address]    Report Status 05/18/2024 FINAL  Final  Blood Culture (routine x 2)     Status: None   Collection Time: 05/13/24  4:00 AM   Specimen: BLOOD  Result Value Ref Range Status   Specimen Description BLOOD BLOOD LEFT ARM  Final   Special Requests AEROBIC BOTTLE ONLY Blood Culture adequate  volume  Final   Culture   Final    NO GROWTH 5 DAYS Performed at [facility], [address], [place], [place] [address]    Report Status 05/18/2024 FINAL  Final  MRSA Next Gen by PCR, Nasal     Status: None   Collection Time: 05/14/24  9:44 AM   Specimen: Nasal Mucosa; Nasal Swab  Result Value Ref Range Status   MRSA by PCR Next Gen NOT DETECTED NOT DETECTED Final    Comment: (NOTE) The GeneXpert MRSA Assay (FDA approved for NASAL specimens only), is one component of a comprehensive MRSA colonization surveillance program. It is not intended to diagnose MRSA infection nor to guide or monitor treatment for MRSA infections. Test performance is not FDA approved in patients less than 16 years old. Performed at [facility], [address], [place], [place] [address]       Radiology Studies: No results found.    LOS: 5 days    Elgin Lam, MD [facility] 05/18/2024, 8:37 AM   If 7PM-7AM, please contact night-coverage [email or web address]  "

## 2024-05-18 NOTE — Progress Notes (Signed)
 Reached out to Dr. Gretta regarding Heparin  gtt and scheduled surgery today. Dr. Gretta stated to turn Heparin  gtt off on call to OR. Will make sure gtt is off when patient arrives in pre-op.

## 2024-05-18 NOTE — Progress Notes (Signed)
" °  Progress Note    05/18/2024 8:50 AM 3 Days Post-Op  Ischemic right foot that is not salvageable despite revascularization She is scheduled for right AKA today in the OR with Dr. Gretta Discussed with patient again this morning and she seem she understands her current situation and agreeable to proceed. She did not have any questions about the surgery  Keep NPO Consent ordered   [name] [facility] [phone] 05/18/2024 8:50 AM "

## 2024-05-18 NOTE — Progress Notes (Signed)
 Patient did not show for scheduled appointment today.  This is supposed to be her second postoperative appointment because she no-showed last week as well.

## 2024-05-18 NOTE — Progress Notes (Signed)
 PHARMACY - ANTICOAGULATION/Antibiotic CONSULT NOTE  Pharmacy Consult for Heparin   Indication: vascular occlusion  Allergies[1]  Patient Measurements: Height: 5' 1 (154.9 cm) Weight: 50 kg (110 lb 3.7 oz) (from 04/27/24 office visit) IBW/kg (Calculated) : 47.8  Vital Signs: Temp: 97.7 F (36.5 C) (12/22 0256) Temp Source: Oral (12/22 0256) BP: 118/49 (12/22 0256) Pulse Rate: 82 (12/22 0256)  Labs: Recent Labs    05/16/24 0754 05/17/24 0257 05/17/24 1156 05/17/24 2106 05/18/24 0429  HGB 7.8* 7.7* 9.6*  --  8.3*  HCT 25.1* 23.9* 30.3*  --  26.3*  PLT 88* 69* 68*  --  65*  HEPARINUNFRC 0.36 0.18* 0.36 0.32 0.55  CREATININE 1.09* 1.12*  --   --   --     Estimated Creatinine Clearance: 29.7 mL/min (A) (by C-G formula based on SCr of 1.12 mg/dL (H)).  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 Heparin  for rule out vascular occlusion. Labs above reviewed. PTA meds reviewed. Pt s/p lysis, heparin  to resumed 6h after sheath removal (12/19 ~1105). Will target lower heparin  level goal. Potential amputation Monday, 12/22.   Heparin  level came back therapeutic at 0.55, on 950 units/hr. Hgb 8.3, plt 65 (continuing to drift down; baseline before heparin  291 > low to intermediate risk for HIT). TRH and VVS MD made aware, VVS MD would like to continue heparin  for poor perfusion to limb. No s/sx of bleeding or infusion issues.   Goal of Therapy:  Heparin  level 0.3-0.5 units/ml Monitor platelets by anticoagulation protocol: Yes   Plan:  Reduce heparin  infusion to 900 units/hr - plan to stop on call to OR Follow up after procedure about [facility] plan Check daily heparin  level Monitor Hgb, PLT daily  Thank you for allowing pharmacy to participate in this patient's care,  Suzen Sour, PharmD, BCCCP Clinical Pharmacist  Phone: [phone] 05/18/2024 7:33 AM  Please check AMION for all [facility] Pharmacy phone  numbers After 10:00 PM, call Main Pharmacy [phone]      [1]  Allergies Allergen Reactions   Penicillins Anaphylaxis, Swelling, Rash and Other (See Comments)    Immediate rash, facial/tongue/throat swelling, SOB or lightheadedness with hypotension

## 2024-05-18 NOTE — Anesthesia Procedure Notes (Signed)
 Procedure Name: Intubation Date/Time: 05/18/2024 1:44 PM  Performed by: Leopoldo Wanda DASEN, CRNAPre-anesthesia Checklist: Patient identified, Emergency Drugs available, Suction available and Patient being monitored Patient Re-evaluated:Patient Re-evaluated prior to induction Oxygen Delivery Method: Circle System Utilized Preoxygenation: Pre-oxygenation with 100% oxygen Induction Type: IV induction Ventilation: Mask ventilation without difficulty Laryngoscope Size: Mac and 3 Grade View: Grade I Tube type: Oral Tube size: 6.5 mm Number of attempts: 1 Airway Equipment and Method: Stylet Placement Confirmation: ETT inserted through vocal cords under direct vision, positive ETCO2 and breath sounds checked- equal and bilateral Secured at: 22 cm Tube secured with: Tape Dental Injury: Teeth and Oropharynx as per pre-operative assessment

## 2024-05-18 NOTE — Op Note (Signed)
" ° ° °  OPERATIVE NOTE   DATE: May 18, 2024  PROCEDURE: right above-the-knee amputation  PRE-OPERATIVE DIAGNOSIS: right foot gangrene  POST-OPERATIVE DIAGNOSIS: same as above  SURGEON: Tabitha DOROTHA Gaskins, MD  ASSISTANT(S): Curry Damme, [place]  ANESTHESIA: general  ESTIMATED BLOOD LOSS: Minimal  FINDING(S): Right above-knee amputation with healthy tissue margins.  SPECIMEN(S):  right above-the-knee amputation  INDICATIONS:   [name] is a 83 y.o. female who presents for a right above-the-knee amputation.  I discussed in depth with the patient the risks, benefits, and alternatives to this procedure.  The patient is aware that the risk of this operation included but are not limited to:  bleeding, infection, myocardial infarction, stroke, death, failure to heal amputation wound, and possible need for more proximal amputation.  The patient is aware of the risks and agrees proceed forward with the procedure.  An assistant was needed given the complexity of the case and also for the amputation and flap closure.  DESCRIPTION: After full informed written consent was obtained from the patient, the patient was brought back to the operating room, and placed supine upon the operating table.  Prior to induction, the patient received IV antibiotics.  The patient was then prepped and draped in the standard fashion for an right above-the-knee amputation. I marked out the anterior and posterior flaps for a fish-mouth type of amputation.  I made the incisions for these flaps, and then dissected through the subcutaneous tissue, fascia, and muscles circumferentially.  I elevated  the periosteal tissue 4 cm more proximal than the anterior skin flap.  I then transected the femur with a power saw at this level.  The femoral vessels were clamped between Kelly clamps and divided.  I did remove the Viabahn stent.  The vessels were oversewn proximally with a 2-0 silk and a 2-0 Vicryl suture with good  hemostasis.  The posterior flap was completed with Bovie cautery. At this point, the specimen was passed off the field as the above-the-knee amputation.  At this point, I clamped all visibly bleeding arteries and veins using a combination of suture ligation with Vicryl suture and electrocautery.  Bleeding continued to be controlled with electrocautery and suture ligature.  The stump was washed off with sterile normal saline and no further active bleeding was noted.  I reapproximated the anterior and posterior fascia  with interrupted stitches of 2-0 Vicryl with the help of my assistant.  This was completed along the entire length of anterior and posterior fascia until there were no more loose space in the fascial line.  The skin was then  reapproximated with staples.  The stump was washed off and dried.  The incision was dressed with [employer] and  then fluffs were applied.  Kerlix was wrapped around the leg and then gently an ACE wrap was applied.    COMPLICATIONS: None  CONDITION: Stable  Tabitha DOROTHA Gaskins, MD [facility] Office: [phone]  [name]   05/18/2024, 2:43 PM  "

## 2024-05-18 NOTE — Care Management Important Message (Signed)
 Important Message  Patient Details  Name: [name] MRN: [id number] Date of Birth: 15-Sep-1940   Important Message Given:  Yes - Medicare IM     Vonzell Arrie Sharps 05/18/2024, 10:19 AM

## 2024-05-18 NOTE — TOC Initial Note (Signed)
 Transition of Care [facility]) - Initial/Assessment Note    Patient Details  Name: [name] MRN: [id number] Date of Birth: 05/21/1941  Transition of Care [facility]) CM/SW Contact:    [name], LCSW Phone Number: 05/18/2024, 12:37 PM  Clinical Narrative:                 CSW met with patient at bedside, introduced self and explained role. CSW discussed with patient recommendation for short term rehab. Patient is agreeable to rehab at [facility]. CSW explained the SNF process. No preference at this time. All questions answered.   TOC will provide bed offers once available.   Montie Louder, MSW, LCSW Clinical Social Worker     Expected Discharge Plan: Skilled Nursing Facility Barriers to Discharge: Continued Medical Work up   Patient Goals and CMS Choice            Expected Discharge Plan and Services In-house Referral: Clinical Social Work     Living arrangements for the past 2 months: Single Family Home                                      Prior Living Arrangements/Services Living arrangements for the past 2 months: Single Family Home Lives with:: Self, Significant Other Patient language and need for interpreter reviewed:: No        Need for Family Participation in Patient Care: Yes (Comment) Care giver support system in place?: Yes (comment)   Criminal Activity/Legal Involvement Pertinent to Current Situation/Hospitalization: No - Comment as needed  Activities of Daily Living   ADL Screening (condition at time of admission) Independently performs ADLs?: Yes (appropriate for developmental age) Is the patient deaf or have difficulty hearing?: Yes Does the patient have difficulty seeing, even when wearing glasses/contacts?: No Does the patient have difficulty concentrating, remembering, or making decisions?: Yes  Permission Sought/Granted Permission sought to share information with : Family Supports Permission granted to share information with : Yes,  Verbal Permission Granted  Share Information with NAME: [name] Jewett  Permission granted to share info w AGENCY: SNFs  Permission granted to share info w Relationship: significant other  Permission granted to share info w Contact Information: [phone]  Emotional Assessment Appearance:: Appears stated age Attitude/Demeanor/Rapport: Engaged Affect (typically observed): Accepting, Appropriate Orientation: : Oriented to Self, Oriented to Place, Oriented to  Time, Oriented to Situation Alcohol / Substance Use: Not Applicable Psych Involvement: No (comment)  Admission diagnosis:  AKI (acute kidney injury) [N17.9] Vascular occlusion [I99.8] Postoperative infection, unspecified type, initial encounter [T81.40XA] Patient Active Problem List   Diagnosis Date Noted   Rt Foot Postoperative infection, unspecified type, initial encounter 05/13/2024   Gangrene of toe of right foot (HCC) 04/13/2024   Peripheral artery disease 04/13/2024   Acute pancreatitis 03/08/2024   Lumbar radiculopathy 09/05/2023   History of CVA (cerebrovascular accident) 07/15/2023   B12 deficiency 09/20/2022   Hypertensive urgency 09/16/2022   Left renal artery stenosis 09/16/2022   GERD (gastroesophageal reflux disease) 03/25/2022   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   Unintentional weight loss 03/19/2022   PAD (peripheral artery disease) -Atherosclerotic ulcer of aorta -posterior aspect of the descending aorta measuring up to 4 mm in thickness 01/24/2020   Tobacco abuse 01/23/2020   PAD/Atherosclerotic ulcer of aorta -posterior aspect of the descending aorta measuring up to 4 mm in thickness 01/22/2020   GAD (generalized anxiety disorder) 08/26/2019   Osteopenia  of multiple sites 08/26/2019   Depression, recurrent 04/21/2019   Stage 3b chronic kidney disease (HCC) 04/21/2019   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   COPD with chronic bronchitis (HCC) 01/01/2019   Hyperlipidemia LDL goal <70 07/09/2016    History of TIA (transient ischemic attack) 03/06/2016   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HCC) 02/13/2012   Left-sided weakness 02/11/2012   Essential hypertension with goal blood pressure less than 130/80 02/11/2012   CALCANEAL SPUR 10/05/2009   PCP:  Zollie Lowers, MD Pharmacy:   [facility] [place], [place] - [address] [place] [address] Phone: [phone] Fax: [phone]     Social Drivers of Health (SDOH) Social History: SDOH Screenings   Food Insecurity: Patient Unable To Answer (05/16/2024)  Housing: Patient Unable To Answer (05/16/2024)  Transportation Needs: Patient Unable To Answer (05/16/2024)  Recent Concern: Transportation Needs - Unmet Transportation Needs (04/13/2024)  Utilities: Not At Risk (04/20/2024)  Alcohol Screen: Low Risk (11/14/2023)  Depression (PHQ2-9): High Risk (04/21/2024)  Financial Resource Strain: Medium Risk (03/20/2024)  Physical Activity: Insufficiently Active (11/14/2023)  Social Connections: Unknown (05/16/2024)  Stress: Stress Concern Present (11/14/2023)  Tobacco Use: Medium Risk (05/18/2024)  Health Literacy: Adequate Health Literacy (11/14/2023)   SDOH Interventions: Transportation Interventions: Inpatient TOC, Other (Comment) (Will need transportation at discharge.)   Readmission Risk Interventions    05/14/2024   11:16 AM 07/15/2023   12:11 PM  Readmission Risk Prevention Plan  Post Dischage Appt  Complete  Medication Screening  Complete  Transportation Screening Complete Complete  PCP or Specialist Appt within 3-5 Days Complete   HRI or Home Care Consult Complete   Social Work Consult for Recovery Care Planning/Counseling Complete   Palliative Care Screening Not Applicable   Medication Review Oceanographer) Referral to Pharmacy

## 2024-05-18 NOTE — Anesthesia Preprocedure Evaluation (Addendum)
 "                                  Anesthesia Evaluation  Patient identified by MRN, date of birth, ID band Patient awake    Reviewed: Allergy & Precautions, NPO status , Patient's Chart, lab work & pertinent test results  Airway Mallampati: I  TM Distance: >3 FB Neck ROM: Full    Dental  (+) Edentulous Upper, Edentulous Lower, Dental Advisory Given   Pulmonary COPD, Patient abstained from smoking., former smoker   breath sounds clear to auscultation       Cardiovascular hypertension, Pt. on medications + CAD, + Past MI, + Peripheral Vascular Disease and +CHF   Rhythm:Regular Rate:Normal  TTE 2025 1. Left ventricular ejection fraction, by estimation, is 55 to 60%. The  left ventricle has normal function. The left ventricle has no regional  wall motion abnormalities. There is mild left ventricular hypertrophy.  Left ventricular diastolic parameters  are consistent with Grade I diastolic dysfunction (impaired relaxation).   2. Right ventricular systolic function is normal. The right ventricular  size is normal.   3. The mitral valve is normal in structure. No evidence of mitral valve  regurgitation. No evidence of mitral stenosis.   4. The aortic valve is tricuspid. Aortic valve regurgitation is not  visualized. No aortic stenosis is present.   5. The inferior vena cava is normal in size with greater than 50%  respiratory variability, suggesting right atrial pressure of 3 mmHg.     Neuro/Psych  Headaches PSYCHIATRIC DISORDERS Anxiety Depression    CVA, Residual Symptoms    GI/Hepatic ,GERD  ,,(+) Hepatitis -  Endo/Other  negative endocrine ROS    Renal/GU Renal InsufficiencyRenal disease  negative genitourinary   Musculoskeletal negative musculoskeletal ROS (+)  S/p ANTERIOR CERVICAL CORPECTOMY    Abdominal   Peds  Hematology  (+) Blood dyscrasia (plavix ), anemia Lab Results      Component                Value               Date                       WBC                      8.9                 05/18/2024                HGB                      8.3 (L)             05/18/2024                HCT                      26.3 (L)            05/18/2024                MCV                      90.4                05/18/2024  PLT                      65 (L)              05/18/2024              Anesthesia Other Findings   Reproductive/Obstetrics                              Anesthesia Physical Anesthesia Plan  ASA: 3  Anesthesia Plan: General   Post-op Pain Management: Tylenol  PO (pre-op)*   Induction: Intravenous  PONV Risk Score and Plan: 3 and Dexamethasone , Ondansetron  and Treatment may vary due to age or medical condition  Airway Management Planned: Oral ETT  Additional Equipment:   Intra-op Plan:   Post-operative Plan: Extubation in OR  Informed Consent: I have reviewed the patients History and Physical, chart, labs and discussed the procedure including the risks, benefits and alternatives for the proposed anesthesia with the patient or authorized representative who has indicated his/her understanding and acceptance.     Dental advisory given  Plan Discussed with: CRNA  Anesthesia Plan Comments:          Anesthesia Quick Evaluation  "

## 2024-05-19 ENCOUNTER — Encounter (HOSPITAL_COMMUNITY): Payer: Self-pay | Admitting: Vascular Surgery

## 2024-05-19 DIAGNOSIS — E785 Hyperlipidemia, unspecified: Secondary | ICD-10-CM | POA: Diagnosis not present

## 2024-05-19 DIAGNOSIS — T8140XA Infection following a procedure, unspecified, initial encounter: Secondary | ICD-10-CM | POA: Diagnosis not present

## 2024-05-19 DIAGNOSIS — J4489 Other specified chronic obstructive pulmonary disease: Secondary | ICD-10-CM | POA: Diagnosis not present

## 2024-05-19 DIAGNOSIS — I739 Peripheral vascular disease, unspecified: Secondary | ICD-10-CM | POA: Diagnosis not present

## 2024-05-19 LAB — GLUCOSE, CAPILLARY
Glucose-Capillary: 103 mg/dL — ABNORMAL HIGH (ref 70–99)
Glucose-Capillary: 116 mg/dL — ABNORMAL HIGH (ref 70–99)
Glucose-Capillary: 126 mg/dL — ABNORMAL HIGH (ref 70–99)
Glucose-Capillary: 68 mg/dL — ABNORMAL LOW (ref 70–99)
Glucose-Capillary: 76 mg/dL (ref 70–99)
Glucose-Capillary: 89 mg/dL (ref 70–99)

## 2024-05-19 LAB — BASIC METABOLIC PANEL WITH GFR
Anion gap: 7 (ref 5–15)
BUN: 17 mg/dL (ref 8–23)
CO2: 21 mmol/L — ABNORMAL LOW (ref 22–32)
Calcium: 7.8 mg/dL — ABNORMAL LOW (ref 8.9–10.3)
Chloride: 112 mmol/L — ABNORMAL HIGH (ref 98–111)
Creatinine, Ser: 1.04 mg/dL — ABNORMAL HIGH (ref 0.44–1.00)
GFR, Estimated: 54 mL/min — ABNORMAL LOW
Glucose, Bld: 124 mg/dL — ABNORMAL HIGH (ref 70–99)
Potassium: 5 mmol/L (ref 3.5–5.1)
Sodium: 140 mmol/L (ref 135–145)

## 2024-05-19 LAB — CBC
HCT: 26 % — ABNORMAL LOW (ref 36.0–46.0)
Hemoglobin: 8.5 g/dL — ABNORMAL LOW (ref 12.0–15.0)
MCH: 30 pg (ref 26.0–34.0)
MCHC: 32.7 g/dL (ref 30.0–36.0)
MCV: 91.9 fL (ref 80.0–100.0)
Platelets: 91 K/uL — ABNORMAL LOW (ref 150–400)
RBC: 2.83 MIL/uL — ABNORMAL LOW (ref 3.87–5.11)
RDW: 13.7 % (ref 11.5–15.5)
WBC: 9.5 K/uL (ref 4.0–10.5)
nRBC: 0 % (ref 0.0–0.2)

## 2024-05-19 LAB — HEPARIN LEVEL (UNFRACTIONATED): Heparin Unfractionated: 0.47 [IU]/mL (ref 0.30–0.70)

## 2024-05-19 NOTE — Evaluation (Signed)
 Occupational Therapy Evaluation Patient Details Name: [name] MRN: [id number] DOB: [birth year] Today's Date: 05/19/2024   History of Present Illness   83 yo F adm 12/17 with CP and foot pain. 12/19 RLE angiogram,thrombectomy of superficial femoral artery, popliteal artery, tibioperoneal trunk and peroneal artery. 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 S/p aortogram with stent placement of R superficial femoral artery and thrombectomy of R superficial femoral-popliteal artery 11/18,COPD, HTN, CVA, renal artery stenosis, CHF, NSTEMI, tobacco use, and gout.     Clinical Impressions PTA pt lives independently at home with her significant other. Pt confused at times however able to mobilize to EOB with max A +2. Unable to stand at this time due to c/o pain, weakness and dizziness. Overall max to toal A with ADL tasks. Pt needing assistance for feeding due to jerking asterixis movements. VSS however showing runs of VTAC - nsg made aware and states she was not contacted by central tele. Given decline in functional status, feel patient will benefit from continued inpatient follow up therapy, <3 hours/day to maximize functional level of independence with goal of returning home. Acute OT to follow.      If plan is discharge home, recommend the following:   Two people to help with walking and/or transfers;Two people to help with bathing/dressing/bathroom     Functional Status Assessment   Patient has had a recent decline in their functional status and demonstrates the ability to make significant improvements in function in a reasonable and predictable amount of time.     Equipment Recommendations   BSC/3in1     Recommendations for Other Services         Precautions/Restrictions   Precautions Precautions: Fall Restrictions Weight Bearing Restrictions Per Provider Order: Yes RLE Weight Bearing Per Provider Order: Non weight bearing     Mobility Bed  Mobility Overal bed mobility: Needs Assistance Bed Mobility: Supine to Sit, Sit to Supine     Supine to sit: Max assist, +2 for physical assistance Sit to supine: Max assist, +2 for physical assistance   General bed mobility comments: bed pad used; Pain R residual limb increased when sitting EOB    Transfers                   General transfer comment: unable      Balance Overall balance assessment: Needs assistance, History of Falls Sitting-balance support: Feet supported Sitting balance-Leahy Scale: Poor                                     ADL either performed or assessed with clinical judgement   ADL Overall ADL's : Needs assistance/impaired Eating/Feeding: Minimal assistance Eating/Feeding Details (indicate cue type and reason): limited by tremors/jerking Grooming: Minimal assistance   Upper Body Bathing: Sitting;Minimal assistance   Lower Body Bathing: Maximal assistance;Bed level   Upper Body Dressing : Maximal assistance;Sitting   Lower Body Dressing: Total assistance               Functional mobility during ADLs: Maximal assistance;+2 for safety/equipment (for EOB)       Vision Baseline Vision/History: 1 Wears glasses Ability to See in Adequate Light: 1 Impaired       Perception         Praxis         Pertinent Vitals/Pain Pain Assessment Pain Assessment: Faces Faces Pain Scale: Hurts whole lot  Pain Location: R residual limb Pain Descriptors / Indicators: Discomfort, Grimacing, Moaning Pain Intervention(s): Limited activity within patient's tolerance     Extremity/Trunk Assessment Upper Extremity Assessment Upper Extremity Assessment: Generalized weakness (asterixis type movemetns)   Lower Extremity Assessment Lower Extremity Assessment: Defer to PT evaluation   Cervical / Trunk Assessment Cervical / Trunk Assessment: Kyphotic   Communication Communication Communication: No apparent difficulties    Cognition Arousal: Lethargic Behavior During Therapy: WFL for tasks assessed/performed Cognition: No family/caregiver present to determine baseline, Cognition impaired   Orientation impairments: Place, Time Awareness: Intellectual awareness impaired, Online awareness impaired Memory impairment (select all impairments): Short-term memory, Working civil service fast streamer, Engineer, structural memory Attention impairment (select first level of impairment): Sustained attention Executive functioning impairment (select all impairments): Initiation, Problem solving                   Following commands: Impaired Following commands impaired: Follows one step commands with increased time     Cueing  General Comments   Cueing Techniques: Verbal cues;Gestural cues;Tactile cues;Visual cues      Exercises     Shoulder Instructions      Home Living Family/patient expects to be discharged to:: Private residence Living Arrangements: Spouse/significant other Available Help at Discharge: Family;Available 24 hours/day Type of Home: House Home Access: Stairs to enter [employer] of Steps: 1 Entrance Stairs-Rails: None Home Layout: One level     Bathroom Shower/Tub: Chief Strategy Officer: Standard Bathroom Accessibility: Yes   Home Equipment: Agricultural Consultant (2 wheels);Cane - quad;BSC/3in1          Prior Functioning/Environment Prior Level of Function : Independent/Modified Independent;History of Falls (last six months)             Mobility Comments: reports frequent falls in last 1-2 months, walks 15-20 min at a time each day, cane in house and RW in community      OT Problem List: Decreased strength;Decreased range of motion;Decreased activity tolerance;Impaired balance (sitting and/or standing);Impaired vision/perception;Decreased coordination;Decreased cognition;Decreased safety awareness;Decreased knowledge of use of DME or AE;Cardiopulmonary status limiting  activity;Pain;Impaired UE functional use   OT Treatment/Interventions: Self-care/ADL training;Therapeutic exercise;DME and/or AE instruction;Therapeutic activities;Cognitive remediation/compensation;Patient/family education;Canalith reposition      OT Goals(Current goals can be found in the care plan section)   Acute Rehab OT Goals Patient Stated Goal: get better OT Goal Formulation: Patient unable to participate in goal setting Time For Goal Achievement: 06/02/24 Potential to Achieve Goals: Fair   OT Frequency:  Min 2X/week    Co-evaluation              AM-PAC OT 6 Clicks Daily Activity     Outcome Measure Help from another person eating meals?: A Lot Help from another person taking care of personal grooming?: A Little Help from another person toileting, which includes using toliet, bedpan, or urinal?: Total Help from another person bathing (including washing, rinsing, drying)?: A Lot Help from another person to put on and taking off regular upper body clothing?: A Lot Help from another person to put on and taking off regular lower body clothing?: Total 6 Click Score: 11   End of Session Nurse Communication: Mobility status;Other (comment) (HR)  Activity Tolerance: Patient limited by lethargy;Patient limited by pain Patient left: in bed;with call bell/phone within reach;with bed alarm set;Other (comment);with SCD's reapplied (chari position)  OT Visit Diagnosis: Unsteadiness on feet (R26.81);Other abnormalities of gait and mobility (R26.89);Muscle weakness (generalized) (M62.81);Repeated falls (R29.6);Pain;Cognitive communication deficit (R41.841);Dizziness and giddiness (R42) Pain -  Right/Left: Right Pain - part of body: Leg                Time: 9140-9073 OT Time Calculation (min): 27 min Charges:  OT General Charges $OT Visit: 1 Visit OT Evaluation $OT Eval Moderate Complexity: 1 Mod  Lysha Schrade, OT/L   Acute OT Clinical Specialist Acute Rehabilitation  Services Pager [phone] Office [phone]   [facility] 05/19/2024, 10:03 AM

## 2024-05-19 NOTE — Progress Notes (Signed)
 "  PROGRESS NOTE    SHIMEKA BACOT  FMW:[id number] DOB: 12-21-1940 DOA: 05/13/2024 PCP: Zollie Lowers, MD   Brief Narrative: [name] is a 83 y.o. female with a history of hypertension, hyperlipidemia, CAD, CKD stage IIIa, HFpEF, stroke, COPD, renal artery stenosis, PAD, GERD, restless leg syndrome.  Patient presented secondary to foot and chest pain with evidence of critical limb ischemia involving her right lower extremity. Vascular surgery consulted. Patient underwent thrombolysis with plan for above knee amputation.   Assessment and Plan:  Acute limb ischemia of right foot Peripheral vascular disease Vascular surgery consulted. Patient underwent thrombolysis of the right lower extremity 12/18-12/19. Patient with obvious ischemic disease. 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  -Vascular surgery recommendations: pending today -Discontinue meropenem  today -Continue Heparin  IV, aspirin  and Plavix  per vascular surgery  Anemia of chronic kidney disease Acute blood loss anemia Baseline hemoglobin of 9-10. Drift down to 8.3 and is stable. -CBC in AM  Acute thrombocytopenia Possibly related to blood loss. Patient is on heparin , however platelet drop coincides directly with her multiple vascular procedures. Patient is on aspirin  and Plavix  as well. Appears to have stabilized and now improving. -Trend CBC  Acute metabolic encephalopathy Cognitive impairment at baseline. Presumed secondary to infection. Intermittent confusion. Patient has capacity to make decisions at this time.  AKI on CKD stage IIIa Baseline creatinine of around 1.1-1.4. Creatinine of 2.65 on admission. AKI improved and renal function is back to baseline.  Cerebrovascular disease -Continue aspirin  and Plavix  -Continue Lipitor   Primary hypertension Prior to arrival medication(s) includes amlodipine .  Chronic HFpEF Noted and stable. Last LVEF of 55-60% with grade 1 diastolic  dysjunction.  Hyperlipidemia -Continue Lipitor   Nonobstructive CAD Noted. -Continue antiplatelet therapy -Continue Lipitor   COPD -Continue albuterol  and Breo Ellipta    DVT prophylaxis: Heparin  IV Code Status:   Code Status: Full Code Family Communication: None at bedside Disposition Plan: Discharge pending ongoing specialist recommendations   Consultants:  PCCM Vascular surgery  Procedures:  Vascular surgery procedure (12/18): Ultrasound-guided access of left common femoral artery Aortogram and  right leg angiogram Third order cannulation of right popliteal UniFuse catheter placement into right popliteal and SFA with initiation of lysis Vascular surgery procedure (12/19): Right lower extremity angiogram Percutaneous mechanical thrombectomy CAT 6 penumbra device superficial femoral artery, popliteal artery, tibioperoneal trunk, peroneal artery Viabahn stenting 6 x 25, 6 x 15, 6 x 5 superficial femoral artery, popliteal artery Balloon angioplasty tibioperoneal trunk, anterior tibial artery, posterior tibial artery Access manage using ProGlide device without issue No sedation medication administered  Antimicrobials: Vancomycin  Meropenem     Subjective: Patient states she has the feeling of not feeling her leg when trying to move it. Otherwise no issues.  Objective: BP (!) 113/48 (BP Location: Right Arm)   Pulse 84   Temp 98.2 F (36.8 C) (Oral)   Resp 16   Ht 5' 1 (1.549 m)   Wt 50 kg Comment: from 04/27/24 office visit  SpO2 98%   BMI 20.83 kg/m   Examination:  General exam: Appears calm and comfortable. Respiratory system: Clear to auscultation. Respiratory effort normal. Cardiovascular system: S1 & S2 heard, RRR.  Gastrointestinal system: Abdomen is nondistended, soft and nontender. Normal bowel sounds heard. Central nervous system: Alert and oriented. No focal neurological deficits. Musculoskeletal: No calf tenderness. Right above knee amputation with  wound covered by surgical dressing.   Data Reviewed: I have personally reviewed following labs and imaging studies  CBC Lab Results  Component Value  Date   WBC 9.5 05/19/2024   RBC 2.83 (L) 05/19/2024   HGB 8.5 (L) 05/19/2024   HCT 26.0 (L) 05/19/2024   MCV 91.9 05/19/2024   MCH 30.0 05/19/2024   PLT 91 (L) 05/19/2024   MCHC 32.7 05/19/2024   RDW 13.7 05/19/2024   LYMPHSABS 1.3 05/13/2024   MONOABS 0.6 05/13/2024   EOSABS 0.1 05/13/2024   BASOSABS 0.0 05/13/2024     Last metabolic panel Lab Results  Component Value Date   NA 140 05/19/2024   K 5.0 05/19/2024   CL 112 (H) 05/19/2024   CO2 21 (L) 05/19/2024   BUN 17 05/19/2024   CREATININE 1.04 (H) 05/19/2024   GLUCOSE 124 (H) 05/19/2024   GFRNONAA 54 (L) 05/19/2024   GFRAA 53 (L) 01/23/2020   CALCIUM  7.8 (L) 05/19/2024   PHOS 2.9 (L) 06/18/2023   PROT 4.5 (L) 05/17/2024   ALBUMIN 2.0 (L) 05/17/2024   LABGLOB 2.7 03/17/2024   AGRATIO 1.5 10/30/2022   BILITOT 0.3 05/17/2024   ALKPHOS 41 05/17/2024   AST 24 05/17/2024   ALT <5 05/17/2024   ANIONGAP 7 05/19/2024    GFR: Estimated Creatinine Clearance: 32 mL/min (A) (by C-G formula based on SCr of 1.04 mg/dL (H)).  Recent Results (from the past 240 hours)  Blood Culture (routine x 2)     Status: None   Collection Time: 05/13/24  3:20 AM   Specimen: BLOOD  Result Value Ref Range Status   Specimen Description BLOOD RIGHT ANTECUBITAL  Final   Special Requests   Final    BOTTLES DRAWN AEROBIC AND ANAEROBIC Blood Culture adequate volume   Culture   Final    NO GROWTH 5 DAYS Performed at [facility], [address] [place], [place] [address]    Report Status 05/18/2024 FINAL  Final  Blood Culture (routine x 2)     Status: None   Collection Time: 05/13/24  4:00 AM   Specimen: BLOOD  Result Value Ref Range Status   Specimen Description BLOOD BLOOD LEFT ARM  Final   Special Requests AEROBIC BOTTLE ONLY Blood Culture adequate volume  Final   Culture   Final     NO GROWTH 5 DAYS Performed at [facility], [address], [place], [place] [address]    Report Status 05/18/2024 FINAL  Final  MRSA Next Gen by PCR, Nasal     Status: None   Collection Time: 05/14/24  9:44 AM   Specimen: Nasal Mucosa; Nasal Swab  Result Value Ref Range Status   MRSA by PCR Next Gen NOT DETECTED NOT DETECTED Final    Comment: (NOTE) The GeneXpert MRSA Assay (FDA approved for NASAL specimens only), is one component of a comprehensive MRSA colonization surveillance program. It is not intended to diagnose MRSA infection nor to guide or monitor treatment for MRSA infections. Test performance is not FDA approved in patients less than 77 years old. Performed at [facility], [address], [place], [place] [address]       Radiology Studies: No results found.    LOS: 6 days    Elgin Lam, MD [facility] 05/19/2024, 8:02 AM   If 7PM-7AM, please contact night-coverage [email or web address]  "

## 2024-05-19 NOTE — Progress Notes (Addendum)
" °  Progress Note    05/19/2024 7:55 AM 1 Day Post-Op  Subjective:  no complaints    Vitals:   05/19/24 0453 05/19/24 0749  BP: (!) 118/52 (!) 113/48  Pulse:  84  Resp:  16  Temp: 98.3 F (36.8 C) 98.2 F (36.8 C)  SpO2:  98%    Physical Exam: General:  resting in bed Cardiac:  regular Lungs:  nonlabored Incisions:  R AKA dressed and dry  CBC    Component Value Date/Time   WBC 9.5 05/19/2024 0430   RBC 2.83 (L) 05/19/2024 0430   HGB 8.5 (L) 05/19/2024 0430   HGB 12.9 03/17/2024 0842   HCT 26.0 (L) 05/19/2024 0430   HCT 41.0 03/17/2024 0842   PLT 91 (L) 05/19/2024 0430   PLT 306 03/17/2024 0842   MCV 91.9 05/19/2024 0430   MCV 97 03/17/2024 0842   MCH 30.0 05/19/2024 0430   MCHC 32.7 05/19/2024 0430   RDW 13.7 05/19/2024 0430   RDW 12.2 03/17/2024 0842   LYMPHSABS 1.3 05/13/2024 0328   LYMPHSABS 1.4 03/17/2024 0842   MONOABS 0.6 05/13/2024 0328   EOSABS 0.1 05/13/2024 0328   EOSABS 0.2 03/17/2024 0842   BASOSABS 0.0 05/13/2024 0328   BASOSABS 0.0 03/17/2024 0842    BMET    Component Value Date/Time   NA 140 05/19/2024 0430   NA 146 (H) 03/17/2024 0842   K 5.0 05/19/2024 0430   CL 112 (H) 05/19/2024 0430   CO2 21 (L) 05/19/2024 0430   GLUCOSE 124 (H) 05/19/2024 0430   BUN 17 05/19/2024 0430   BUN 8 03/17/2024 0842   CREATININE 1.04 (H) 05/19/2024 0430   CALCIUM  7.8 (L) 05/19/2024 0430   GFRNONAA 54 (L) 05/19/2024 0430   GFRAA 53 (L) 01/23/2020 0700    INR    Component Value Date/Time   INR 1.0 05/13/2024 0328     Intake/Output Summary (Last 24 hours) at 05/19/2024 0755 Last data filed at 05/18/2024 1800 Gross per 24 hour  Intake 800 ml  Output 50 ml  Net 750 ml      Assessment/Plan:  83 y.o. female is 1 day post op, s/p: R AKA   -She is doing well this morning. She endorses a little bit of pain at her amputation site but this is well controlled -R AKA is dressed and dry -Hgb stable at 8.5 without further signs of blood  loss -Will plan for R AKA dressing takedown tomorrow. Okay to mobilize as tolerated   Ahmed Holster, PA-C [facility] [phone] 05/19/2024 7:55 AM   I have seen and evaluated the patient. I agree with the PA note as documented above.   Lonni DOROTHA Gaskins, MD [facility] [place] Office: [phone]  "

## 2024-05-19 NOTE — Evaluation (Signed)
 Physical Therapy Evaluation Patient Details Name: [name] MRN: [id number] DOB: 01-31-1941 Today's Date: 05/19/2024  History of Present Illness  83 yo F adm 05/13/24 with CP and foot pain. 12/19 RLE angiogram,thrombectomy of superficial femoral artery, popliteal artery, tibioperoneal trunk and peroneal artery. 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 S/p aortogram with stent placement of R superficial femoral artery and thrombectomy of R superficial femoral-popliteal artery 11/18,COPD, HTN, CVA, renal artery stenosis, CHF, NSTEMI, tobacco use, and gout.  Clinical Impression  Pt admitted with above diagnosis. Pt from home with her significant other, cannot give accurate history on eval and was quite confused. Pt needed max A +2 to mobilize to EOB and was unable to stand. Pt with weakness and dizziness in sitting. Pt with jerking of BUE's throughout session that seemed to worsen with fatigue. Her ability to communicate worsened with fatigue as well. HR up to 125 bpm with activity, monitor showed several runs of [facility] made aware. Patient will benefit from continued inpatient follow up therapy, <3 hours/day.  Pt currently with functional limitations due to the deficits listed below (see PT Problem List). Pt will benefit from acute skilled PT to increase their independence and safety with mobility to allow discharge.           If plan is discharge home, recommend the following: Two people to help with walking and/or transfers;Two people to help with bathing/dressing/bathroom;Assistance with feeding;Assistance with cooking/housework;Assist for transportation;Help with stairs or ramp for entrance;Direct supervision/assist for medications management;Direct supervision/assist for financial management   Can travel by private vehicle   No    Equipment Recommendations None recommended by PT (TBD next venue)  Recommendations for Other Services       Functional Status Assessment  Patient has had a recent decline in their functional status and demonstrates the ability to make significant improvements in function in a reasonable and predictable amount of time.     Precautions / Restrictions Precautions Precautions: Fall Restrictions Weight Bearing Restrictions Per Provider Order: Yes RLE Weight Bearing Per Provider Order: Non weight bearing      Mobility  Bed Mobility Overal bed mobility: Needs Assistance Bed Mobility: Supine to Sit, Sit to Supine     Supine to sit: Max assist, +2 for physical assistance Sit to supine: Max assist, +2 for physical assistance   General bed mobility comments: bed pad used; Pain R residual limb increased when sitting EOB. Pt needed assist with LLE back into bed with return to supine. Pt needed specific instructional cues to assist with UE's on bedrail and was limited by jerking in BUE's    Transfers Overall transfer level: Needs assistance                 General transfer comment: pt unstable in sitting, unable to attempt standing. Scooted to R along EOB with max A +2    Ambulation/Gait               General Gait Details: unable  Stairs            Wheelchair Mobility     Tilt Bed    Modified Rankin (Stroke Patients Only)       Balance Overall balance assessment: Needs assistance, History of Falls Sitting-balance support: Feet supported Sitting balance-Leahy Scale: Poor Sitting balance - Comments: posterior LOB with mvmt Postural control: Posterior lean  Pertinent Vitals/Pain Pain Assessment Pain Assessment: Faces Faces Pain Scale: Hurts whole lot Pain Location: R residual limb Pain Descriptors / Indicators: Discomfort, Grimacing, Moaning Pain Intervention(s): Limited activity within patient's tolerance, Monitored during session    Home Living Family/patient expects to be discharged to:: Private residence Living Arrangements:  Spouse/significant other Available Help at Discharge: Family;Available 24 hours/day Type of Home: House Home Access: Stairs to enter Entrance Stairs-Rails: None Entrance Stairs-Number of Steps: 1   Home Layout: One level Home Equipment: [employer] (2 wheels);Cane - quad;BSC/3in1 Additional Comments: pt poor historian, need prompting with info from last admission    Prior Function Prior Level of Function : Independent/Modified Independent;History of Falls (last six months)             Mobility Comments: per chart pt was ambulatory PTA but was falling frequently ADLs Comments: pt reports independence, helps her spouse with his ADLs     Extremity/Trunk Assessment   Upper Extremity Assessment Upper Extremity Assessment: Defer to OT evaluation;Generalized weakness    Lower Extremity Assessment Lower Extremity Assessment: Generalized weakness;RLE deficits/detail;LLE deficits/detail;Difficult to assess due to impaired cognition RLE Deficits / Details: cramping R hip flexor with mvmt which caused her increased pain, was able to relax with gentle massage to hip flexor and taught pt to do this as well. RLE Coordination: decreased gross motor LLE Deficits / Details: hip flex 3-/5, knee ext 3/5 LLE Coordination: decreased gross motor    Cervical / Trunk Assessment Cervical / Trunk Assessment: Kyphotic  Communication   Communication Communication: Impaired Factors Affecting Communication: Reduced clarity of speech;Difficulty expressing self    Cognition Arousal: Lethargic Behavior During Therapy: WFL for tasks assessed/performed   PT - Cognitive impairments: Orientation, Awareness, Memory, Attention, Initiation, Sequencing, Problem solving, Safety/Judgement   Orientation impairments: Place, Time, Situation                   PT - Cognition Comments: pt oriented only to self and did know that it was Christmas. Lethargic throughout eval and had increasingly difficult  time expressing herself as she fatigued Following commands: Impaired Following commands impaired: Follows one step commands with increased time     Cueing Cueing Techniques: Verbal cues, Gestural cues, Tactile cues, Visual cues     General Comments General comments (skin integrity, edema, etc.): HR fluctuated 95-125 bpm. BP before session 114/62, 120/58 in sitting, 131/61 end of session    Exercises     Assessment/Plan    PT Assessment Patient needs continued PT services  PT Problem List Decreased strength;Decreased activity tolerance;Decreased balance;Decreased mobility;Decreased cognition;Decreased coordination;Decreased knowledge of use of DME;Decreased safety awareness;Decreased knowledge of precautions;Pain;Cardiopulmonary status limiting activity       PT Treatment Interventions DME instruction;Functional mobility training;Therapeutic activities;Therapeutic exercise;Balance training;Neuromuscular re-education;Cognitive remediation;Patient/family education;Wheelchair mobility training    PT Goals (Current goals can be found in the Care Plan section)  Acute Rehab PT Goals Patient Stated Goal: none stated PT Goal Formulation: Patient unable to participate in goal setting Time For Goal Achievement: 06/02/24 Potential to Achieve Goals: Fair    Frequency Min 2X/week     Co-evaluation PT/OT/SLP Co-Evaluation/Treatment: Yes Reason for Co-Treatment: For patient/therapist safety;Necessary to address cognition/behavior during functional activity;Complexity of the patient's impairments (multi-system involvement) PT goals addressed during session: Mobility/safety with mobility;Balance OT goals addressed during session: ADL's and self-care;Strengthening/ROM       AM-PAC PT 6 Clicks Mobility  Outcome Measure Help needed turning from your back to your side while in a flat bed without using  bedrails?: Total Help needed moving from lying on your back to sitting on the side of a flat  bed without using bedrails?: Total Help needed moving to and from a bed to a chair (including a wheelchair)?: Total Help needed standing up from a chair using your arms (e.g., wheelchair or bedside chair)?: Total Help needed to walk in hospital room?: Total Help needed climbing 3-5 steps with a railing? : Total 6 Click Score: 6    End of Session   Activity Tolerance: Patient limited by lethargy Patient left: in bed;with call bell/phone within reach;with bed alarm set Nurse Communication: Mobility status PT Visit Diagnosis: Muscle weakness (generalized) (M62.81);Difficulty in walking, not elsewhere classified (R26.2);Pain Pain - Right/Left: Right Pain - part of body: Leg    Time: 9151-9071 PT Time Calculation (min) (ACUTE ONLY): 40 min   Charges:   PT Evaluation $PT Eval Moderate Complexity: 1 Mod PT Treatments $Therapeutic Activity: 8-22 mins PT General Charges $$ ACUTE PT VISIT: 1 Visit         Richerd Lipoma, PT  Acute Rehab Services Secure chat preferred Office [phone]   Richerd CROME Dace Denn 05/19/2024, 11:37 AM

## 2024-05-19 NOTE — Progress Notes (Signed)
 PHARMACY - ANTICOAGULATION/Antibiotic CONSULT NOTE  Pharmacy Consult for Heparin   Indication: vascular occlusion  Allergies[1]  Patient Measurements: Height: 5' 1 (154.9 cm) Weight: 50 kg (110 lb 3.7 oz) (from 04/27/24 office visit) IBW/kg (Calculated) : 47.8  Vital Signs: Temp: 98.1 F (36.7 C) (12/23 1630) Temp Source: Oral (12/23 1630) BP: 109/41 (12/23 1630) Pulse Rate: 88 (12/23 1630)  Labs: Recent Labs    05/17/24 0257 05/17/24 1156 05/17/24 2106 05/18/24 0429 05/19/24 0430 05/19/24 1854  HGB 7.7* 9.6*  --  8.3* 8.5*  --   HCT 23.9* 30.3*  --  26.3* 26.0*  --   PLT 69* 68*  --  65* 91*  --   HEPARINUNFRC 0.18* 0.36 0.32 0.55  --  0.47  CREATININE 1.12*  --   --   --  1.04*  --     Estimated Creatinine Clearance: 32 mL/min (A) (by C-G formula based on SCr of 1.04 mg/dL (H)).  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 Heparin  for rule out vascular occlusion. Labs above reviewed. PTA meds reviewed. Pt s/p lysis, heparin  to resumed 6h after sheath removal (12/19 ~1105). Will target lower heparin  level goal. Potential amputation Monday, 12/22.   Heparin  held for OR on 12/22. Restarted 12/23 a.m. Heparin  level therapeutic (0.47) on infusion at 900 units/hr. No bleeding noted.  Goal of Therapy:  Heparin  level 0.3-0.5 units/ml Monitor platelets by anticoagulation protocol: Yes   Plan:  Continue heparin  infusion at 900 units/hr  Will f/u a.m. heparin  level to confirm therapeutic  Thank you for allowing pharmacy to participate in this patient's care,  Vito Ralph, PharmD, BCPS Please see amion for complete clinical pharmacist phone list 05/19/2024 7:27 PM        [1]  Allergies Allergen Reactions   Penicillins Anaphylaxis, Swelling, Rash and Other (See Comments)    Immediate rash, facial/tongue/throat swelling, SOB or lightheadedness with hypotension

## 2024-05-19 NOTE — Anesthesia Postprocedure Evaluation (Signed)
"   Anesthesia Post Note  Patient: [name]  Procedure(s) Performed: AMPUTATION, ABOVE KNEE (Right: Knee)     Patient location during evaluation: PACU Anesthesia Type: General Level of consciousness: awake and alert Pain management: pain level controlled Vital Signs Assessment: post-procedure vital signs reviewed and stable Respiratory status: spontaneous breathing, nonlabored ventilation, respiratory function stable and patient connected to nasal cannula oxygen Cardiovascular status: blood pressure returned to baseline and stable Postop Assessment: no apparent nausea or vomiting Anesthetic complications: no   No notable events documented.  Last Vitals:  Vitals:   05/19/24 0453 05/19/24 0749  BP: (!) 118/52 (!) 113/48  Pulse:  84  Resp:  16  Temp: 36.8 C 36.8 C  SpO2:  98%    Last Pain:  Vitals:   05/19/24 0749  TempSrc: Oral  PainSc:                  Ladainian Therien L Kiera Hussey      "

## 2024-05-20 ENCOUNTER — Ambulatory Visit (HOSPITAL_COMMUNITY): Payer: Self-pay

## 2024-05-20 LAB — HEMOGLOBIN AND HEMATOCRIT, BLOOD
HCT: 25.8 % — ABNORMAL LOW (ref 36.0–46.0)
Hemoglobin: 7.9 g/dL — ABNORMAL LOW (ref 12.0–15.0)

## 2024-05-20 LAB — GLUCOSE, CAPILLARY
Glucose-Capillary: 110 mg/dL — ABNORMAL HIGH (ref 70–99)
Glucose-Capillary: 122 mg/dL — ABNORMAL HIGH (ref 70–99)
Glucose-Capillary: 133 mg/dL — ABNORMAL HIGH (ref 70–99)
Glucose-Capillary: 42 mg/dL — CL (ref 70–99)
Glucose-Capillary: 65 mg/dL — ABNORMAL LOW (ref 70–99)
Glucose-Capillary: 83 mg/dL (ref 70–99)
Glucose-Capillary: 86 mg/dL (ref 70–99)
Glucose-Capillary: 97 mg/dL (ref 70–99)

## 2024-05-20 LAB — CBC
HCT: 23 % — ABNORMAL LOW (ref 36.0–46.0)
Hemoglobin: 7.1 g/dL — ABNORMAL LOW (ref 12.0–15.0)
MCH: 28.3 pg (ref 26.0–34.0)
MCHC: 30.9 g/dL (ref 30.0–36.0)
MCV: 91.6 fL (ref 80.0–100.0)
Platelets: 136 K/uL — ABNORMAL LOW (ref 150–400)
RBC: 2.51 MIL/uL — ABNORMAL LOW (ref 3.87–5.11)
RDW: 13.8 % (ref 11.5–15.5)
WBC: 8.5 K/uL (ref 4.0–10.5)
nRBC: 0 % (ref 0.0–0.2)

## 2024-05-20 LAB — POCT ACTIVATED CLOTTING TIME: Activated Clotting Time: 256 s

## 2024-05-20 LAB — BASIC METABOLIC PANEL WITH GFR
Anion gap: 6 (ref 5–15)
BUN: 19 mg/dL (ref 8–23)
CO2: 24 mmol/L (ref 22–32)
Calcium: 7.8 mg/dL — ABNORMAL LOW (ref 8.9–10.3)
Chloride: 112 mmol/L — ABNORMAL HIGH (ref 98–111)
Creatinine, Ser: 0.92 mg/dL (ref 0.44–1.00)
GFR, Estimated: >60 mL/min
Glucose, Bld: 96 mg/dL (ref 70–99)
Potassium: 4.6 mmol/L (ref 3.5–5.1)
Sodium: 141 mmol/L (ref 135–145)

## 2024-05-20 LAB — HEPARIN LEVEL (UNFRACTIONATED)
Heparin Unfractionated: 0.63 [IU]/mL (ref 0.30–0.70)
Heparin Unfractionated: 0.51 [IU]/mL (ref 0.30–0.70)

## 2024-05-20 LAB — SURGICAL PATHOLOGY

## 2024-05-20 NOTE — Progress Notes (Addendum)
" °  Progress Note    05/20/2024 8:25 AM 2 Days Post-Op  Subjective:  no major complaints   Vitals:   05/20/24 0700 05/20/24 0811  BP: 121/81 121/81  Pulse:  79  Resp:  16  Temp: 98.3 F (36.8 C) 98.3 F (36.8 C)  SpO2:  100%   Physical Exam: Cardiac:  regular Lungs:  non labored Incisions:  right AKA well appearing, viable flaps, no fluid collections, no drainage. Tenderness as expected Extremities:  LLE remains well perfused with palpable DP Abdomen:  soft Neurologic: alert and oriented  CBC    Component Value Date/Time   WBC 8.5 05/20/2024 0344   RBC 2.51 (L) 05/20/2024 0344   HGB 7.1 (L) 05/20/2024 0344   HGB 12.9 03/17/2024 0842   HCT 23.0 (L) 05/20/2024 0344   HCT 41.0 03/17/2024 0842   PLT 136 (L) 05/20/2024 0344   PLT 306 03/17/2024 0842   MCV 91.6 05/20/2024 0344   MCV 97 03/17/2024 0842   MCH 28.3 05/20/2024 0344   MCHC 30.9 05/20/2024 0344   RDW 13.8 05/20/2024 0344   RDW 12.2 03/17/2024 0842   LYMPHSABS 1.3 05/13/2024 0328   LYMPHSABS 1.4 03/17/2024 0842   MONOABS 0.6 05/13/2024 0328   EOSABS 0.1 05/13/2024 0328   EOSABS 0.2 03/17/2024 0842   BASOSABS 0.0 05/13/2024 0328   BASOSABS 0.0 03/17/2024 0842    BMET    Component Value Date/Time   NA 141 05/20/2024 0344   NA 146 (H) 03/17/2024 0842   K 4.6 05/20/2024 0344   CL 112 (H) 05/20/2024 0344   CO2 24 05/20/2024 0344   GLUCOSE 96 05/20/2024 0344   BUN 19 05/20/2024 0344   BUN 8 03/17/2024 0842   CREATININE 0.92 05/20/2024 0344   CALCIUM  7.8 (L) 05/20/2024 0344   GFRNONAA >60 05/20/2024 0344   GFRAA 53 (L) 01/23/2020 0700    INR    Component Value Date/Time   INR 1.0 05/13/2024 0328     Intake/Output Summary (Last 24 hours) at 05/20/2024 0825 Last data filed at 05/20/2024 9374 Gross per 24 hour  Intake 198.54 ml  Output 400 ml  Net -201.46 ml     Assessment/Plan:  83 y.o. female is s/p right AKA 2 Days Post-Op   Overall pain controlled well Right AKA dressings removed.  Flaps appear viable. No active drainage or appreciable fluid collections 4x4s and ACE reapplied Will place order for stump sock Hemodynamically stable. Hgb 7.1. asymptomatic. Transfusion per primary team PT/OT to re evaluate  Teretha Damme, PA-C [facility] [phone] 05/20/2024 8:25 AM  I have seen and evaluated the patient. I agree with the PA note as documented above.  Status post right above-knee amputation.  This looks good.  Will arrange follow-up in 4 to 6 weeks for staple removal as we discussed.  Call vascular with questions or concerns.  Lonni DOROTHA Gaskins, MD [facility] [place] Office: [phone]  "

## 2024-05-20 NOTE — Progress Notes (Signed)
 PHARMACY - ANTICOAGULATION/Antibiotic CONSULT NOTE  Pharmacy Consult for Heparin   Indication: vascular occlusion  Allergies[1]  Patient Measurements: Height: 5' 0.98 (154.9 cm) Weight: 50 kg (110 lb 3.7 oz) IBW/kg (Calculated) : 47.76 HEPARIN  DW (KG): 50  Vital Signs: Temp: 98 F (36.7 C) (12/24 1114) Temp Source: Oral (12/24 1114) BP: 119/50 (12/24 1114) Pulse Rate: 80 (12/24 1114)  Labs: Recent Labs    05/18/24 0429 05/19/24 0430 05/19/24 1854 05/20/24 0344 05/20/24 0840 05/20/24 1524  HGB 8.3* 8.5*  --  7.1* 7.9*  --   HCT 26.3* 26.0*  --  23.0* 25.8*  --   PLT 65* 91*  --  136*  --   --   HEPARINUNFRC 0.55  --  0.47 0.63  --  0.51  CREATININE  --  1.04*  --  0.92  --   --     Estimated Creatinine Clearance: 36.2 mL/min (by C-G formula based on SCr of 0.92 mg/dL).  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 Heparin  for rule out vascular occlusion. Labs above reviewed. PTA meds reviewed. Pt s/p lysis, heparin  to resumed 6h after sheath removal (12/19 ~1105). Will target lower heparin  level goal. Potential amputation Monday, 12/22.   Heparin  level 0.63 is supratherapeutic with heparin  running at 900 units/hr. Hgb (7.1) is slightly downtrending and PLTs (136) are stable. Patient renal function is stable. Per RN, no report of pauses, issues with the line, or signs of bleeding.   12/24 PM: Heparin  level 0.51 which is slightly supratherapeutic with heparin  running at 800 units/hr. No issues or signs of bleeding per RN.   Goal of Therapy:  Heparin  level 0.3-0.5 units/ml Monitor platelets by anticoagulation protocol: Yes   Plan:  Decrease heparin  heparin  to 750 units/hr Recheck heparin  level after 8 hours to confirm therapeutic Monitor daily heparin  levels and CBC Monitor for any signs/symptoms of bleeding  Thank you for allowing pharmacy to be involved with this patient's care.  R. Samual Satterfield, PharmD PGY-1 Acute Care Pharmacy Resident [facility] Please refer to [facility] for [facility] Pharmacy numbers 05/20/2024 4:02 PM     [1]  Allergies Allergen Reactions   Penicillins Anaphylaxis, Swelling, Rash and Other (See Comments)    Immediate rash, facial/tongue/throat swelling, SOB or lightheadedness with hypotension

## 2024-05-20 NOTE — Plan of Care (Signed)

## 2024-05-20 NOTE — Progress Notes (Signed)
 PHARMACY - ANTICOAGULATION/Antibiotic CONSULT NOTE  Pharmacy Consult for Heparin   Indication: vascular occlusion  Allergies[1]  Patient Measurements: Height: 5' 1 (154.9 cm) Weight: 50 kg (110 lb 3.7 oz) (from 04/27/24 office visit) IBW/kg (Calculated) : 47.8  Vital Signs: Temp: 98.6 F (37 C) (12/24 0254) Temp Source: Oral (12/24 0254) BP: 111/67 (12/24 0345) Pulse Rate: 77 (12/24 0345)  Labs: Recent Labs    05/18/24 0429 05/19/24 0430 05/19/24 1854 05/20/24 0344  HGB 8.3* 8.5*  --  7.1*  HCT 26.3* 26.0*  --  23.0*  PLT 65* 91*  --  136*  HEPARINUNFRC 0.55  --  0.47 0.63  CREATININE  --  1.04*  --  0.92    Estimated Creatinine Clearance: 36.2 mL/min (by C-G formula based on SCr of 0.92 mg/dL).  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 Heparin  for rule out vascular occlusion. Labs above reviewed. PTA meds reviewed. Pt s/p lysis, heparin  to resumed 6h after sheath removal (12/19 ~1105). Will target lower heparin  level goal. Potential amputation Monday, 12/22.   Heparin  level 0.63 is supratherapeutic with heparin  running at 900 units/hr. Hgb (7.1) is slightly downtrending and PLTs (136) are stable. Patient renal function is stable. Per RN, no report of pauses, issues with the line, or signs of bleeding.    Goal of Therapy:  Heparin  level 0.3-0.5 units/ml Monitor platelets by anticoagulation protocol: Yes   Plan:  Decrease heparin  heparin  to 800 units/hr Recheck heparin  level after 8 hours to confirm therapeutic Monitor daily heparin  levels and CBC Monitor for any signs/symptoms of bleeding   Thank you for allowing pharmacy to be involved with this patient's care.  Mendel Barter, PharmD PGY1 Clinical Pharmacist [facility]  05/20/2024 7:15 AM    [1]  Allergies Allergen Reactions   Penicillins Anaphylaxis, Swelling, Rash and Other (See Comments)    Immediate rash,  facial/tongue/throat swelling, SOB or lightheadedness with hypotension

## 2024-05-20 NOTE — Progress Notes (Signed)
 " PROGRESS NOTE    [name]  FMW:[id number] DOB: [birth year] DOA: 05/13/2024 PCP: Tabitha Lowers, MD  Chief Complaint  Patient presents with   Foot Pain   Chest Pain    Brief Narrative:   [name] is [name] 84 y.o. female with [name] history of hypertension, hyperlipidemia, CAD, CKD stage IIIa, HFpEF, stroke, COPD, renal artery stenosis, PAD, GERD, restless leg syndrome. Patient presented secondary to foot and chest pain with evidence of critical limb ischemia involving her right lower extremity. Vascular surgery consulted. Patient underwent thrombolysis with plan for above knee amputation.   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  Post op care per vascular.    Assessment & Plan:   Principal Problem:   Rt Foot Postoperative infection, unspecified type, initial encounter Active Problems:   Peripheral artery disease   Hyperlipidemia LDL goal <70   COPD with chronic bronchitis (HCC)   Stage 3b chronic kidney disease (HCC)   Tobacco abuse   PAD (peripheral artery disease) -Atherosclerotic ulcer of aorta -posterior aspect of the descending aorta measuring up to 4 mm in thickness   Gangrene of toe of right foot (HCC)  Acute limb ischemia of right foot Peripheral vascular disease Vascular surgery consulted. Patient underwent thrombolysis of the right lower extremity 12/18-12/19. Patient with obvious ischemic disease. 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  -Vascular surgery recommendations noted -Continue Heparin  IV, aspirin  and Plavix  per vascular surgery - will adjust pain regimen   Anemia of chronic kidney disease Acute blood loss anemia Hb wnl [name] few months ago.  Downtrending since admission.   -relatively stable today, monitor -transfuse for <7 or symptomatic   Acute thrombocytopenia Possibly related to blood loss. Patient is on heparin , however platelet drop coincides directly with her multiple vascular procedures. Patient is on aspirin  and Plavix  as well. Appears to have  stabilized and now improving. -Trend CBC   Acute metabolic encephalopathy Cognitive impairment at baseline. Presumed secondary to infection. Has had intermittent confusion. Delirium precautions Appropriate today   AKI on CKD stage IIIa Baseline creatinine of around 1.1-1.4. Creatinine of 2.65 on admission. AKI improved and renal function is back to baseline.   Cerebrovascular disease -Continue aspirin  and Plavix  -Continue Lipitor    Primary hypertension Prior to arrival medication(s) includes amlodipine .   Chronic HFpEF Noted and stable. Last LVEF of 55-60% with grade 1 diastolic dysjunction.   Hyperlipidemia -Continue Lipitor , zetia    Nonobstructive CAD -Continue antiplatelet therapy -Continue Lipitor    COPD -Continue albuterol  and Breo Ellipta     DVT prophylaxis: heparin  gtt Code Status: full Family Communication: none Disposition:   Status is: Inpatient Remains inpatient appropriate because: need for continued inpatient care   Consultants:  vascular  Procedures:  12/22 right above-the-knee amputation   Antimicrobials:  Anti-infectives (From admission, onward)    Start     Dose/Rate Route Frequency Ordered Stop   05/16/24 0600  vancomycin  (VANCOCIN ) IVPB 1000 mg/200 mL premix  Status:  Discontinued        1,000 mg 200 mL/hr over 60 Minutes Intravenous Every 48 hours 05/14/24 0844 05/14/24 0942   05/15/24 2200  meropenem  (MERREM ) 1 g in sodium chloride  0.9 % 100 mL IVPB        1 g 200 mL/hr over 30 Minutes Intravenous Every 12 hours 05/15/24 0925 05/19/24 2155   05/14/24 1000  meropenem  (MERREM ) 500 mg in sodium chloride  0.9 % 100 mL IVPB  Status:  Discontinued        500 mg 200  mL/hr over 30 Minutes Intravenous Every 12 hours 05/14/24 0844 05/15/24 0940   05/14/24 0600  vancomycin  (VANCOREADY) IVPB 500 mg/100 mL  Status:  Discontinued        500 mg 100 mL/hr over 60 Minutes Intravenous Every 48 hours 05/13/24 1552 05/14/24 0844   05/13/24 2200   meropenem  (MERREM ) 1 g in sodium chloride  0.9 % 100 mL IVPB  Status:  Discontinued        1 g 200 mL/hr over 30 Minutes Intravenous Every 24 hours 05/13/24 1552 05/14/24 0844   05/13/24 0330  meropenem  (MERREM ) 1 g in sodium chloride  0.9 % 100 mL IVPB       Placed in And Linked Group   1 g 200 mL/hr over 30 Minutes Intravenous  Once 05/13/24 0329 05/13/24 0424   05/13/24 0330  vancomycin  (VANCOCIN ) IVPB 1000 mg/200 mL premix       Placed in And Linked Group   1,000 mg 200 mL/hr over 60 Minutes Intravenous  Once 05/13/24 0329 05/13/24 0524       Subjective: Complaining of RLE pain   Objective: Vitals:   05/20/24 0700 05/20/24 0811 05/20/24 1114 05/20/24 1629  BP: 121/81 121/81 (!) 119/50 (!) 105/56  Pulse:  79 80 82  Resp:  16 18 18   Temp: 98.3 F (36.8 C) 98.3 F (36.8 C) 98 F (36.7 C) 98.2 F (36.8 C)  TempSrc:  Oral Oral Oral  SpO2:  100% 100% 100%  Weight: 50 kg     Height: 5' 0.98 (1.549 m)       Intake/Output Summary (Last 24 hours) at 05/20/2024 1814 Last data filed at 05/20/2024 1800 Gross per 24 hour  Intake 918.54 ml  Output 700 ml  Net 218.54 ml   Filed Weights   05/13/24 1500 05/20/24 0700  Weight: 50 kg 50 kg    Examination:  General exam: Appears calm and comfortable  Respiratory system: unlabored Cardiovascular system: RRR Gastrointestinal system: Abdomen is nondistended, soft and nontender. Central nervous system: Alert and oriented. No focal neurological deficits. Extremities: R AKA    Data Reviewed: I have personally reviewed following labs and imaging studies  CBC: Recent Labs  Lab 05/17/24 0257 05/17/24 1156 05/18/24 0429 05/19/24 0430 05/20/24 0344 05/20/24 0840  WBC 9.9 10.0 8.9 9.5 8.5  --   HGB 7.7* 9.6* 8.3* 8.5* 7.1* 7.9*  HCT 23.9* 30.3* 26.3* 26.0* 23.0* 25.8*  MCV 90.9 91.8 90.4 91.9 91.6  --   PLT 69* 68* 65* 91* 136*  --     Basic Metabolic Panel: Recent Labs  Lab 05/15/24 0142 05/16/24 0754  05/17/24 0257 05/19/24 0430 05/20/24 0344  NA 144 141 139 140 141  K 3.6 3.6 3.4* 5.0 4.6  CL 110 111 110 112* 112*  CO2 21* 22 23 21* 24  GLUCOSE 68* 114* 122* 124* 96  BUN 28* 20 20 17 19   CREATININE 1.22* 1.09* 1.12* 1.04* 0.92  CALCIUM  8.3* 7.5* 7.5* 7.8* 7.8*    GFR: Estimated Creatinine Clearance: 36.2 mL/min (by C-G formula based on SCr of 0.92 mg/dL).  Liver Function Tests: Recent Labs  Lab 05/14/24 0445 05/17/24 0257  AST 13* 24  ALT <5 <5  ALKPHOS 47 41  BILITOT 0.6 0.3  PROT 5.4* 4.5*  ALBUMIN 2.7* 2.0*    CBG: Recent Labs  Lab 05/20/24 0810 05/20/24 1116 05/20/24 1158 05/20/24 1207 05/20/24 1631  GLUCAP 83 65* 42* 133* 97     Recent Results (from the past 240 hours)  Blood Culture (routine x 2)     Status: None   Collection Time: 05/13/24  3:20 AM   Specimen: BLOOD  Result Value Ref Range Status   Specimen Description BLOOD RIGHT ANTECUBITAL  Final   Special Requests   Final    BOTTLES DRAWN AEROBIC AND ANAEROBIC Blood Culture adequate volume   Culture   Final    NO GROWTH 5 DAYS Performed at [facility], [address], [place], [place] [address]    Report Status 05/18/2024 FINAL  Final  Blood Culture (routine x 2)     Status: None   Collection Time: 05/13/24  4:00 AM   Specimen: BLOOD  Result Value Ref Range Status   Specimen Description BLOOD BLOOD LEFT ARM  Final   Special Requests AEROBIC BOTTLE ONLY Blood Culture adequate volume  Final   Culture   Final    NO GROWTH 5 DAYS Performed at [facility], [address] [place], [place] [address]    Report Status 05/18/2024 FINAL  Final  MRSA Next Gen by PCR, Nasal     Status: None   Collection Time: 05/14/24  9:44 AM   Specimen: Nasal Mucosa; Nasal Swab  Result Value Ref Range Status   MRSA by PCR Next Gen NOT DETECTED NOT DETECTED Final    Comment: (NOTE) The GeneXpert MRSA Assay (FDA approved for NASAL specimens only), is one component of [facility] comprehensive MRSA colonization  surveillance program. It is not intended to diagnose MRSA infection nor to guide or monitor treatment for MRSA infections. Test performance is not FDA approved in patients less than 37 years old. Performed at [facility], [address], [place], [place] [address]          Radiology Studies: No results found.      Scheduled Meds:  aspirin  EC  81 mg Oral Q breakfast   atorvastatin   80 mg Oral Daily   clopidogrel   75 mg Oral Daily   ezetimibe   10 mg Oral Daily   feeding supplement  237 mL Oral BID BM   fluticasone  furoate-vilanterol  1 puff Inhalation Daily   pantoprazole   40 mg Oral Daily   pregabalin   300 mg Oral QHS   rOPINIRole   1 mg Oral QHS   sodium chloride  flush  3 mL Intravenous Q12H   Continuous Infusions:  heparin  750 Units/hr (05/20/24 1644)     LOS: 7 days    Time spent: over 30 min     Meliton Monte, MD [facility]   To contact the attending provider between 7A-7P or the covering provider during after hours 7P-7A, please log into the web site [email or web address] and access using universal [place] password for that web site. If you do not have the password, please call the hospital operator.  05/20/2024, 6:14 PM    "

## 2024-05-20 NOTE — Progress Notes (Addendum)
 Patient is alert and oriented*2, had breakfast this morning, her CBG at 1116 was 65 mg/dl, assisted to feed 2oz of orange juice and 2 cups of puddings, patient was not symptomatic for hypoglycemia, CBG rechecked at 1158 which was 42 mg/dl, patient's hands were cold, 8 oz of orange juice provided , CBG rechecked on her ear lobes. 133 mg/dl at 8792, patient is alert without any symptoms. POC continues.

## 2024-05-20 NOTE — Plan of Care (Signed)
   Problem: Clinical Measurements: Goal: Ability to maintain clinical measurements within normal limits will improve Outcome: Progressing Goal: Will remain free from infection Outcome: Progressing Goal: Diagnostic test results will improve Outcome: Progressing Goal: Respiratory complications will improve Outcome: Progressing Goal: Cardiovascular complication will be avoided Outcome: Progressing   Problem: Nutrition: Goal: Adequate nutrition will be maintained Outcome: Progressing   Problem: Activity: Goal: Risk for activity intolerance will decrease Outcome: Progressing   Problem: Coping: Goal: Level of anxiety will decrease Outcome: Progressing

## 2024-05-21 DIAGNOSIS — T8140XA Infection following a procedure, unspecified, initial encounter: Secondary | ICD-10-CM | POA: Diagnosis not present

## 2024-05-21 LAB — COMPREHENSIVE METABOLIC PANEL WITH GFR
ALT: <5 U/L (ref 0–44)
AST: 15 U/L (ref 15–41)
Albumin: 2.1 g/dL — ABNORMAL LOW (ref 3.5–5.0)
Alkaline Phosphatase: 36 U/L — ABNORMAL LOW (ref 38–126)
Anion gap: 5 (ref 5–15)
BUN: 18 mg/dL (ref 8–23)
CO2: 26 mmol/L (ref 22–32)
Calcium: 7.8 mg/dL — ABNORMAL LOW (ref 8.9–10.3)
Chloride: 110 mmol/L (ref 98–111)
Creatinine, Ser: 0.91 mg/dL (ref 0.44–1.00)
GFR, Estimated: >60 mL/min
Glucose, Bld: 99 mg/dL (ref 70–99)
Potassium: 4.8 mmol/L (ref 3.5–5.1)
Sodium: 141 mmol/L (ref 135–145)
Total Bilirubin: <0.2 mg/dL (ref 0.0–1.2)
Total Protein: 4.6 g/dL — ABNORMAL LOW (ref 6.5–8.1)

## 2024-05-21 LAB — CBC WITH DIFFERENTIAL/PLATELET
Abs Immature Granulocytes: 0.06 K/uL (ref 0.00–0.07)
Basophils Absolute: 0 K/uL (ref 0.0–0.1)
Basophils Relative: 0 %
Eosinophils Absolute: 0.2 K/uL (ref 0.0–0.5)
Eosinophils Relative: 2 %
HCT: 21.8 % — ABNORMAL LOW (ref 36.0–46.0)
Hemoglobin: 6.8 g/dL — CL (ref 12.0–15.0)
Immature Granulocytes: 1 %
Lymphocytes Relative: 24 %
Lymphs Abs: 2 K/uL (ref 0.7–4.0)
MCH: 28.9 pg (ref 26.0–34.0)
MCHC: 31.2 g/dL (ref 30.0–36.0)
MCV: 92.8 fL (ref 80.0–100.0)
Monocytes Absolute: 0.7 K/uL (ref 0.1–1.0)
Monocytes Relative: 9 %
Neutro Abs: 5.3 K/uL (ref 1.7–7.7)
Neutrophils Relative %: 64 %
Platelets: 169 K/uL (ref 150–400)
RBC: 2.35 MIL/uL — ABNORMAL LOW (ref 3.87–5.11)
RDW: 13.7 % (ref 11.5–15.5)
WBC: 8.3 K/uL (ref 4.0–10.5)
nRBC: 0 % (ref 0.0–0.2)

## 2024-05-21 LAB — HEPARIN LEVEL (UNFRACTIONATED)
Heparin Unfractionated: 0.34 [IU]/mL (ref 0.30–0.70)
Heparin Unfractionated: 0.29 [IU]/mL — ABNORMAL LOW (ref 0.30–0.70)
Heparin Unfractionated: 0.28 [IU]/mL — ABNORMAL LOW (ref 0.30–0.70)

## 2024-05-21 LAB — GLUCOSE, CAPILLARY
Glucose-Capillary: 101 mg/dL — ABNORMAL HIGH (ref 70–99)
Glucose-Capillary: 105 mg/dL — ABNORMAL HIGH (ref 70–99)
Glucose-Capillary: 105 mg/dL — ABNORMAL HIGH (ref 70–99)
Glucose-Capillary: 107 mg/dL — ABNORMAL HIGH (ref 70–99)
Glucose-Capillary: 112 mg/dL — ABNORMAL HIGH (ref 70–99)
Glucose-Capillary: 89 mg/dL (ref 70–99)

## 2024-05-21 LAB — PHOSPHORUS: Phosphorus: 1.7 mg/dL — ABNORMAL LOW (ref 2.5–4.6)

## 2024-05-21 LAB — HEMOGLOBIN AND HEMATOCRIT, BLOOD
HCT: 25.8 % — ABNORMAL LOW (ref 36.0–46.0)
Hemoglobin: 8.1 g/dL — ABNORMAL LOW (ref 12.0–15.0)

## 2024-05-21 LAB — MAGNESIUM: Magnesium: 2 mg/dL (ref 1.7–2.4)

## 2024-05-21 MED ORDER — SODIUM PHOSPHATES 45 MMOLE/15ML IV SOLN
30.0000 mmol | Freq: Once | INTRAVENOUS | Status: AC
  Administered 2024-05-21: 30 mmol via INTRAVENOUS
  Filled 2024-05-21: qty 10

## 2024-05-21 MED ORDER — SODIUM CHLORIDE 0.9% IV SOLUTION: Freq: Once | INTRAVENOUS | Status: DC

## 2024-05-21 NOTE — Progress Notes (Signed)
 PHARMACY - ANTICOAGULATION/Antibiotic CONSULT NOTE  Pharmacy Consult for Heparin   Indication: vascular occlusion  Allergies[1]  Patient Measurements: Height: 5' 0.98 (154.9 cm) Weight: 50 kg (110 lb 3.7 oz) IBW/kg (Calculated) : 47.76 HEPARIN  DW (KG): 50  Vital Signs: Temp: 98.4 F (36.9 C) (12/25 1534) Temp Source: Oral (12/25 1534) BP: 122/67 (12/25 1534) Pulse Rate: 96 (12/25 1534)  Labs: Recent Labs    05/19/24 0430 05/19/24 1854 05/20/24 0344 05/20/24 0840 05/20/24 1524 05/21/24 0119 05/21/24 0339 05/21/24 0856 05/21/24 1815  HGB 8.5*  --  7.1* 7.9*  --   --  6.8* 8.1*  --   HCT 26.0*  --  23.0* 25.8*  --   --  21.8* 25.8*  --   PLT 91*  --  136*  --   --   --  169  --   --   HEPARINUNFRC  --    < > 0.63  --    < > 0.34 0.29*  --  0.28*  CREATININE 1.04*  --  0.92  --   --   --  0.91  --   --    < > = values in this interval not displayed.    Estimated Creatinine Clearance: 36.6 mL/min (by C-G formula based on SCr of 0.91 mg/dL).  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 Heparin  for rule out vascular occlusion. Labs above reviewed. PTA meds reviewed. Pt s/p lysis, heparin  to resumed 6h after sheath removal (12/19 ~1105).   PM: Heparin  level drifted down to slightly subtherapeutic s/p rate decrease and has remained so s/p slight rate increase to 800 units/hr  Goal of Therapy:  Heparin  level 0.3-0.5 units/ml Monitor platelets by anticoagulation protocol: Yes  Plan: Increase heparin  gtt to 900 units/hr F/u 8 hour heparin  level  Dorn Poot, PharmD, [facility] Clinical Pharmacist ED Pharmacist Phone # [phone] 05/21/2024 7:29 PM           [1]  Allergies Allergen Reactions   Penicillins Anaphylaxis, Swelling, Rash and Other (See Comments)    Immediate rash, facial/tongue/throat swelling, SOB or lightheadedness with hypotension

## 2024-05-21 NOTE — Progress Notes (Signed)
 "    Progress Note    [name]  FMW:[id number] DOB: February 07, 1941  DOA: 05/13/2024 PCP: Zollie Lowers, MD      Brief Narrative:    Medical records reviewed and are as summarized below:  [name] is a 83 y.o. female  with a history of hypertension, hyperlipidemia, CAD, CKD stage IIIa, HFpEF, stroke, COPD, renal artery stenosis, PAD, GERD, restless leg syndrome. Patient presented secondary to foot and chest pain with evidence of critical limb ischemia involving her right lower extremity. Vascular surgery consulted. 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   Acute limb ischemia of right foot Peripheral vascular disease Vascular surgery consulted. Patient underwent thrombolysis of the right lower extremity 12/18-12/19. Patient with obvious ischemic disease. 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  -Vascular surgery recommendations noted - Per vascular surgeon, continue IV heparin  drip, aspirin  and Plavix . Monitor heparin  level per protocol.    Anemia of chronic kidney disease Acute blood loss anemia H&H stable.  Hemoglobin up from 6.8-8.1.    Acute thrombocytopenia Resolved   Acute metabolic encephalopathy Cognitive impairment at baseline.  Mental status improved.   Presumed secondary to infection. Has had intermittent confusion. Delirium precautions    AKI on CKD stage IIIa Baseline creatinine of  around 1.1-1.4. Creatinine of 2.65 on admission. AKI improved and renal function is back to baseline.    Chronic HFpEF Noted and stable. Last LVEF of 55-60% with grade 1 diastolic dysjunction in June 2025.     Comorbidities include nonobstructive history of stroke and CAD on aspirin  and Plavix , COPD, hyperlipidemia, hypertension    Diet Order             Diet regular Room service appropriate? 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    Medications:    sodium chloride    Intravenous Once   aspirin  EC  81 mg Oral Q breakfast   atorvastatin   80 mg Oral Daily   clopidogrel   75 mg Oral Daily   ezetimibe   10 mg Oral Daily   feeding supplement  237 mL Oral BID BM   fluticasone  furoate-vilanterol  1 puff Inhalation Daily   pantoprazole   40 mg Oral Daily   pregabalin   300 mg Oral QHS   rOPINIRole   1 mg Oral QHS   sodium chloride  flush  3 mL Intravenous Q12H   Continuous Infusions:  heparin  800 Units/hr (05/21/24 0924)   sodium PHOSPHATE  IVPB (in mmol)       Anti-infectives (From admission, onward)    Start     Dose/Rate Route Frequency Ordered Stop   05/16/24 0600  vancomycin  (VANCOCIN ) IVPB 1000 mg/200 mL premix  Status:  Discontinued  1,000 mg 200 mL/hr over 60 Minutes Intravenous Every 48 hours 05/14/24 0844 05/14/24 0942   05/15/24 2200  meropenem  (MERREM ) 1 g in sodium chloride  0.9 % 100 mL IVPB        1 g 200 mL/hr over 30 Minutes Intravenous Every 12 hours 05/15/24 0925 05/19/24 2155   05/14/24 1000  meropenem  (MERREM ) 500 mg in sodium chloride  0.9 % 100 mL IVPB  Status:  Discontinued        500 mg 200 mL/hr over 30 Minutes Intravenous Every 12 hours 05/14/24 0844 05/15/24 0940   05/14/24 0600  vancomycin  (VANCOREADY) IVPB 500 mg/100 mL  Status:  Discontinued        500 mg 100 mL/hr over 60 Minutes Intravenous Every 48 hours  05/13/24 1552 05/14/24 0844   05/13/24 2200  meropenem  (MERREM ) 1 g in sodium chloride  0.9 % 100 mL IVPB  Status:  Discontinued        1 g 200 mL/hr over 30 Minutes Intravenous Every 24 hours 05/13/24 1552 05/14/24 0844   05/13/24 0330  meropenem  (MERREM ) 1 g in sodium chloride  0.9 % 100 mL IVPB       Placed in And Linked Group   1 g 200 mL/hr over 30 Minutes Intravenous  Once 05/13/24 0329 05/13/24 0424   05/13/24 0330  vancomycin  (VANCOCIN ) IVPB 1000 mg/200 mL premix       Placed in And Linked Group   1,000 mg 200 mL/hr over 60 Minutes Intravenous  Once 05/13/24 0329 05/13/24 0524              Family Communication/Anticipated D/C date and plan/Code Status   DVT prophylaxis: SCD's Start: 05/18/24 1603 SCDs Start: 05/13/24 1541 Place TED hose Start: 05/13/24 1541     Code Status: Full Code  Family Communication: None Disposition Plan: Plan to discharge to SNF   Status is: Inpatient Remains inpatient appropriate because: Right AKA       Subjective:   Interval events noted.  She still has pain at the right AKA stump site.  She feels better.  Objective:    Vitals:   05/20/24 2300 05/21/24 0300 05/21/24 0800 05/21/24 0811  BP: (!) 108/42 (!) 116/47 (!) 139/52   Pulse: 86 86 89   Resp: 14 11 19    Temp: 98.1 F (36.7 C) 98.9 F (37.2 C) 98.9 F (37.2 C)   TempSrc: Oral Oral Oral   SpO2: 100% 100% 99% 98%  Weight:      Height:       No data found.   Intake/Output Summary (Last 24 hours) at 05/21/2024 1024 Last data filed at 05/20/2024 1800 Gross per 24 hour  Intake 720 ml  Output 300 ml  Net 420 ml   Filed Weights   05/13/24 1500 05/20/24 0700  Weight: 50 kg 50 kg    Exam:  GEN: NAD SKIN: Warm and dry EYES: No pallor or icterus ENT: MMM CV: RRR PULM: CTA B ABD: soft, ND, NT, +BS CNS: AAO x 2 (person and place), non focal EXT: Right AKA stump wound staple are intact.  Wound is clean and dry.       Data Reviewed:   I have  personally reviewed following labs and imaging studies:  Labs: Labs show the following:   Basic Metabolic Panel: Recent Labs  Lab 05/16/24 0754 05/17/24 0257 05/19/24 0430 05/20/24 0344 05/21/24 0339  NA 141 139 140 141 141  K 3.6 3.4* 5.0 4.6 4.8  CL 111 110 112*  112* 110  CO2 22 23 21* 24 26  GLUCOSE 114* 122* 124* 96 99  BUN 20 20 17 19 18   CREATININE 1.09* 1.12* 1.04* 0.92 0.91  CALCIUM  7.5* 7.5* 7.8* 7.8* 7.8*  MG  --   --   --   --  2.0  PHOS  --   --   --   --  1.7*   GFR Estimated Creatinine Clearance: 36.6 mL/min (by C-G formula based on SCr of 0.91 mg/dL). Liver Function Tests: Recent Labs  Lab 05/17/24 0257 05/21/24 0339  AST 24 15  ALT <5 <5  ALKPHOS 41 36*  BILITOT 0.3 <0.2  PROT 4.5* 4.6*  ALBUMIN 2.0* 2.1*   No results for input(s): LIPASE, AMYLASE in the last 168 hours. No results for input(s): AMMONIA in the last 168 hours. Coagulation profile No results for input(s): INR, PROTIME in the last 168 hours.  CBC: Recent Labs  Lab 05/17/24 1156 05/18/24 0429 05/19/24 0430 05/20/24 0344 05/20/24 0840 05/21/24 0339 05/21/24 0856  WBC 10.0 8.9 9.5 8.5  --  8.3  --   NEUTROABS  --   --   --   --   --  5.3  --   HGB 9.6* 8.3* 8.5* 7.1* 7.9* 6.8* 8.1*  HCT 30.3* 26.3* 26.0* 23.0* 25.8* 21.8* 25.8*  MCV 91.8 90.4 91.9 91.6  --  92.8  --   PLT 68* 65* 91* 136*  --  169  --    Cardiac Enzymes: No results for input(s): CKTOTAL, CKMB, CKMBINDEX, TROPONINI in the last 168 hours. BNP (last 3 results) No results for input(s): PROBNP in the last 8760 hours. CBG: Recent Labs  Lab 05/20/24 1631 05/20/24 2004 05/20/24 2353 05/21/24 0349 05/21/24 0806  GLUCAP 97 110* 122* 105* 89   D-Dimer: No results for input(s): DDIMER in the last 72 hours. Hgb A1c: No results for input(s): HGBA1C in the last 72 hours. Lipid Profile: No results for input(s): CHOL, HDL, LDLCALC, TRIG, CHOLHDL, LDLDIRECT in the last 72  hours. Thyroid  function studies: No results for input(s): TSH, T4TOTAL, T3FREE, THYROIDAB in the last 72 hours.  Invalid input(s): FREET3 Anemia work up: No results for input(s): VITAMINB12, FOLATE, FERRITIN, TIBC, IRON, RETICCTPCT in the last 72 hours. Sepsis Labs: Recent Labs  Lab 05/18/24 0429 05/19/24 0430 05/20/24 0344 05/21/24 0339  WBC 8.9 9.5 8.5 8.3    Microbiology Recent Results (from the past 240 hours)  Blood Culture (routine x 2)     Status: None   Collection Time: 05/13/24  3:20 AM   Specimen: BLOOD  Result Value Ref Range Status   Specimen Description BLOOD RIGHT ANTECUBITAL  Final   Special Requests   Final    BOTTLES DRAWN AEROBIC AND ANAEROBIC Blood Culture adequate volume   Culture   Final    NO GROWTH 5 DAYS Performed at [facility], [address] [place], [place] [address]    Report Status 05/18/2024 FINAL  Final  Blood Culture (routine x 2)     Status: None   Collection Time: 05/13/24  4:00 AM   Specimen: BLOOD  Result Value Ref Range Status   Specimen Description BLOOD BLOOD LEFT ARM  Final   Special Requests AEROBIC BOTTLE ONLY Blood Culture adequate volume  Final   Culture   Final    NO GROWTH 5 DAYS Performed at [facility], [address] [place], [place] [address]    Report Status 05/18/2024 FINAL  Final  MRSA Next Gen by  PCR, Nasal     Status: None   Collection Time: 05/14/24  9:44 AM   Specimen: Nasal Mucosa; Nasal Swab  Result Value Ref Range Status   MRSA by PCR Next Gen NOT DETECTED NOT DETECTED Final    Comment: (NOTE) The GeneXpert MRSA Assay (FDA approved for NASAL specimens only), is one component of a comprehensive MRSA colonization surveillance program. It is not intended to diagnose MRSA infection nor to guide or monitor treatment for MRSA infections. Test performance is not FDA approved in patients less than 70 years old. Performed at [facility], [address], [place], [place] [address]      Procedures and diagnostic studies:  No results found.             LOS: 8 days   Edger Husain  [facility]   Pager on [email or web address]. If 7PM-7AM, please contact night-coverage at [email or web address]     05/21/2024, 10:24 AM           "

## 2024-05-21 NOTE — Plan of Care (Signed)

## 2024-05-21 NOTE — Progress Notes (Signed)
 PHARMACY - ANTICOAGULATION/Antibiotic CONSULT NOTE  Pharmacy Consult for Heparin   Indication: vascular occlusion  Allergies[1]  Patient Measurements: Height: 5' 0.98 (154.9 cm) Weight: 50 kg (110 lb 3.7 oz) IBW/kg (Calculated) : 47.76 HEPARIN  DW (KG): 50  Vital Signs: Temp: 98.1 F (36.7 C) (12/24 2300) Temp Source: Oral (12/24 2300) BP: 108/42 (12/24 2300) Pulse Rate: 86 (12/24 2300)  Labs: Recent Labs    05/18/24 0429 05/19/24 0430 05/19/24 1854 05/20/24 0344 05/20/24 0840 05/20/24 1524 05/21/24 0119  HGB 8.3* 8.5*  --  7.1* 7.9*  --   --   HCT 26.3* 26.0*  --  23.0* 25.8*  --   --   PLT 65* 91*  --  136*  --   --   --   HEPARINUNFRC 0.55  --    < > 0.63  --  0.51 0.34  CREATININE  --  1.04*  --  0.92  --   --   --    < > = values in this interval not displayed.    Estimated Creatinine Clearance: 36.2 mL/min (by C-G formula based on SCr of 0.92 mg/dL).  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 Heparin  for rule out vascular occlusion. Labs above reviewed. PTA meds reviewed. Pt s/p lysis, heparin  to resumed 6h after sheath removal (12/19 ~1105). Will target lower heparin  level goal. Potential amputation Monday, 12/22.   Heparin  level 0.63 is supratherapeutic with heparin  running at 900 units/hr. Hgb (7.1) is slightly downtrending and PLTs (136) are stable. Patient renal function is stable. Per RN, no report of pauses, issues with the line, or signs of bleeding.   12/24 PM: Heparin  level 0.51 which is slightly supratherapeutic with heparin  running at 800 units/hr. No issues or signs of bleeding per RN.   12/25 AM update:  Heparin  level therapeutic   Goal of Therapy:  Heparin  level 0.3-0.5 units/ml Monitor platelets by anticoagulation protocol: Yes   Plan:  Cont heparin  750 units/hr Heparin  level with AM labs  Lynwood Mckusick, PharmD, BCPS Clinical Pharmacist Phone: [phone]        [1]  Allergies Allergen Reactions   Penicillins Anaphylaxis, Swelling, Rash and Other (See Comments)    Immediate rash, facial/tongue/throat swelling, SOB or lightheadedness with hypotension

## 2024-05-21 NOTE — Plan of Care (Signed)
   Problem: Clinical Measurements: Goal: Will remain free from infection Outcome: Progressing   Problem: Activity: Goal: Risk for activity intolerance will decrease Outcome: Progressing   Problem: Nutrition: Goal: Adequate nutrition will be maintained Outcome: Progressing   Problem: Coping: Goal: Level of anxiety will decrease Outcome: Progressing   Problem: Pain Managment: Goal: General experience of comfort will improve and/or be controlled Outcome: Progressing   Problem: Safety: Goal: Ability to remain free from injury will improve Outcome: Progressing

## 2024-05-21 NOTE — Progress Notes (Signed)
 PHARMACY - ANTICOAGULATION/Antibiotic CONSULT NOTE  Pharmacy Consult for Heparin   Indication: vascular occlusion  Allergies[1]  Patient Measurements: Height: 5' 0.98 (154.9 cm) Weight: 50 kg (110 lb 3.7 oz) IBW/kg (Calculated) : 47.76 HEPARIN  DW (KG): 50  Vital Signs: Temp: 98.9 F (37.2 C) (12/25 0300) Temp Source: Oral (12/25 0300) BP: 116/47 (12/25 0300) Pulse Rate: 86 (12/25 0300)  Labs: Recent Labs    05/19/24 0430 05/19/24 1854 05/20/24 0344 05/20/24 0840 05/20/24 1524 05/21/24 0119 05/21/24 0339  HGB 8.5*  --  7.1* 7.9*  --   --  6.8*  HCT 26.0*  --  23.0* 25.8*  --   --  21.8*  PLT 91*  --  136*  --   --   --  169  HEPARINUNFRC  --    < > 0.63  --  0.51 0.34 0.29*  CREATININE 1.04*  --  0.92  --   --   --  0.91   < > = values in this interval not displayed.    Estimated Creatinine Clearance: 36.6 mL/min (by C-G formula based on SCr of 0.91 mg/dL).  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 Heparin  for rule out vascular occlusion. Labs above reviewed. PTA meds reviewed. Pt s/p lysis, heparin  to resumed 6h after sheath removal (12/19 ~1105). Will target lower heparin  level goal. Potential amputation Monday, 12/22.   Heparin  level 0.63 is supratherapeutic with heparin  running at 900 units/hr. Hgb (7.1) is slightly downtrending and PLTs (136) are stable. Patient renal function is stable. Per RN, no report of pauses, issues with the line, or signs of bleeding.   12/24 PM: Heparin  level 0.51 which is slightly supratherapeutic with heparin  running at 800 units/hr. No issues or signs of bleeding per RN.    12/25 Update: Heparin  level slightly subtherapeutic at 0.29 on 750 units/hr. Hgb 6.8 and PLT 169. Per RN, no issues with infusion and no signs of bleeding.     Will slightly increase heparin  by 50 units  Thank you for allowing pharmacy to be a part of this patient's care    Goal of  Therapy:  Heparin  level 0.3-0.5 units/ml Monitor platelets by anticoagulation protocol: Yes  Plan: Increase heparin  infusion at 800 units/hr  Check heparin  level in 8 hours and daily while on heparin   Continue to monitor H&H and platelets   Thank you for allowing pharmacy to be a part of this patient's care     Prentice DOROTHA Favors, PharmD PGY1 Health-System Pharmacy Administration and Leadership Resident [facility]  05/21/2024 7:54 AM         [1]  Allergies Allergen Reactions   Penicillins Anaphylaxis, Swelling, Rash and Other (See Comments)    Immediate rash, facial/tongue/throat swelling, SOB or lightheadedness with hypotension

## 2024-05-22 DIAGNOSIS — T8140XA Infection following a procedure, unspecified, initial encounter: Secondary | ICD-10-CM | POA: Diagnosis not present

## 2024-05-22 LAB — CBC
HCT: 21.3 % — ABNORMAL LOW (ref 36.0–46.0)
Hemoglobin: 6.7 g/dL — CL (ref 12.0–15.0)
MCH: 28.5 pg (ref 26.0–34.0)
MCHC: 31.5 g/dL (ref 30.0–36.0)
MCV: 90.6 fL (ref 80.0–100.0)
Platelets: 236 K/uL (ref 150–400)
RBC: 2.35 MIL/uL — ABNORMAL LOW (ref 3.87–5.11)
RDW: 14.1 % (ref 11.5–15.5)
WBC: 8.8 K/uL (ref 4.0–10.5)
nRBC: 0 % (ref 0.0–0.2)

## 2024-05-22 LAB — GLUCOSE, CAPILLARY
Glucose-Capillary: 101 mg/dL — ABNORMAL HIGH (ref 70–99)
Glucose-Capillary: 116 mg/dL — ABNORMAL HIGH (ref 70–99)
Glucose-Capillary: 125 mg/dL — ABNORMAL HIGH (ref 70–99)
Glucose-Capillary: 93 mg/dL (ref 70–99)
Glucose-Capillary: 96 mg/dL (ref 70–99)

## 2024-05-22 LAB — HEPARIN LEVEL (UNFRACTIONATED): Heparin Unfractionated: 0.48 [IU]/mL (ref 0.30–0.70)

## 2024-05-22 LAB — PREPARE RBC (CROSSMATCH)

## 2024-05-22 MED ORDER — SODIUM CHLORIDE 0.9% IV SOLUTION: Freq: Once | INTRAVENOUS | Status: AC

## 2024-05-22 NOTE — Progress Notes (Signed)
 Physical Therapy Treatment Patient Details Name: [name] MRN: [id number] DOB: [birth year] Today's Date: 05/22/2024   History of Present Illness 83 yo F adm 05/13/24 with CP and foot pain. 12/19 RLE angiogram,thrombectomy of superficial femoral artery, popliteal artery, tibioperoneal trunk and peroneal artery. 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 S/p aortogram with stent placement of R superficial femoral artery and thrombectomy of R superficial femoral-popliteal artery 11/18,COPD, HTN, CVA, renal artery stenosis, CHF, NSTEMI, tobacco use, and gout.    PT Comments  Pt resting in bed on arrival, agreeable to session and demonstrating slow but steady progress towards acute goals. Pt limited this session by pain and dizziness with sitting up EOB, BP stable with positional changes. Pt requiring step by step cues for sequencing and mod A to come to sitting EOB, and max A to return to supine at end of session. Pt declining further exercises and drifting off to sleep at end of session. Current plan remains appropriate to address deficits and maximize functional independence and decrease caregiver burden. Pt continues to benefit from skilled PT services to progress toward functional mobility goals.     If plan is discharge home, recommend the following: Two people to help with walking and/or transfers;Two people to help with bathing/dressing/bathroom;Assistance with feeding;Assistance with cooking/housework;Assist for transportation;Help with stairs or ramp for entrance;Direct supervision/assist for medications management;Direct supervision/assist for financial management   Can travel by private vehicle     No  Equipment Recommendations  None recommended by PT (TBD next venue)    Recommendations for Other Services       Precautions / Restrictions Precautions Precautions: Fall Restrictions Weight Bearing Restrictions Per Provider Order: Yes RLE Weight Bearing Per Provider Order:  Non weight bearing     Mobility  Bed Mobility Overal bed mobility: Needs Assistance Bed Mobility: Supine to Sit, Sit to Supine     Supine to sit: Mod assist Sit to supine: Max assist   General bed mobility comments: step by step cues throughout to complete,   Pt needed assist with LLE back into bed with return to supine.    Transfers Overall transfer level: Needs assistance                 General transfer comment: pt with dizziness sitting up EOB, BP 138/52(71) trasnfers deferred as pt requesting to lay back down    Ambulation/Gait               General Gait Details: unable   Stairs             Wheelchair Mobility     Tilt Bed    Modified Rankin (Stroke Patients Only)       Balance Overall balance assessment: Needs assistance, History of Falls Sitting-balance support: Feet supported, Bilateral upper extremity supported Sitting balance-Leahy Scale: Poor Sitting balance - Comments: posterior LOB with mvmt                                    Communication Communication Communication: Impaired Factors Affecting Communication: Reduced clarity of speech;Difficulty expressing self  Cognition Arousal: Alert Behavior During Therapy: WFL for tasks assessed/performed   PT - Cognitive impairments: Memory, Problem solving, Sequencing                         Following commands: Impaired Following commands impaired: Follows one step commands with  increased time    Cueing Cueing Techniques: Verbal cues, Gestural cues, Tactile cues, Visual cues  Exercises      General Comments General comments (skin integrity, edema, etc.): VSS on RA      Pertinent Vitals/Pain Pain Assessment Pain Assessment: Faces Faces Pain Scale: Hurts little more Pain Location: R residual limb Pain Descriptors / Indicators: Discomfort, Grimacing, Moaning Pain Intervention(s): Monitored during session, Limited activity within patient's tolerance,  Repositioned    Home Living                          Prior Function            PT Goals (current goals can now be found in the care plan section) Acute Rehab PT Goals Patient Stated Goal: none stated PT Goal Formulation: Patient unable to participate in goal setting Time For Goal Achievement: 06/02/24 Progress towards PT goals: Progressing toward goals    Frequency    Min 2X/week      PT Plan      Co-evaluation              AM-PAC PT 6 Clicks Mobility   Outcome Measure  Help needed turning from your back to your side while in a flat bed without using bedrails?: Total Help needed moving from lying on your back to sitting on the side of a flat bed without using bedrails?: Total Help needed moving to and from a bed to a chair (including a wheelchair)?: Total Help needed standing up from a chair using your arms (e.g., wheelchair or bedside chair)?: Total Help needed to walk in hospital room?: Total Help needed climbing 3-5 steps with a railing? : Total 6 Click Score: 6    End of Session   Activity Tolerance: Patient limited by pain Patient left: in bed;with call bell/phone within reach;with bed alarm set Nurse Communication: Mobility status PT Visit Diagnosis: Muscle weakness (generalized) (M62.81);Difficulty in walking, not elsewhere classified (R26.2);Pain Pain - Right/Left: Right Pain - part of body: Leg     Time: 1141-1155 PT Time Calculation (min) (ACUTE ONLY): 14 min  Charges:    $Therapeutic Activity: 8-22 mins PT General Charges $$ ACUTE PT VISIT: 1 Visit                     Lovelyn Sheeran R. PTA Acute Rehabilitation Services Office: [phone]   Therisa CHRISTELLA Boor 05/22/2024, 12:47 PM

## 2024-05-22 NOTE — Progress Notes (Signed)
 PHARMACY - ANTICOAGULATION/Antibiotic CONSULT NOTE  Pharmacy Consult for Heparin   Indication: vascular occlusion  Allergies[1]  Patient Measurements: Height: 5' 0.98 (154.9 cm) Weight: 50 kg (110 lb 3.7 oz) IBW/kg (Calculated) : 47.76 HEPARIN  DW (KG): 50  Vital Signs: Temp: 98.7 F (37.1 C) (12/26 0810) Temp Source: Oral (12/26 0810) BP: 105/44 (12/26 0810) Pulse Rate: 86 (12/26 0810)  Labs: Recent Labs    05/20/24 0344 05/20/24 0840 05/21/24 0339 05/21/24 0856 05/21/24 1815 05/22/24 0423  HGB 7.1*   < > 6.8* 8.1*  --  6.7*  HCT 23.0*   < > 21.8* 25.8*  --  21.3*  PLT 136*  --  169  --   --  236  HEPARINUNFRC 0.63   < > 0.29*  --  0.28* 0.48  CREATININE 0.92  --  0.91  --   --   --    < > = values in this interval not displayed.    Estimated Creatinine Clearance: 36.6 mL/min (by C-G formula based on SCr of 0.91 mg/dL).  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 Heparin  for rule out vascular occlusion. Labs above reviewed. PTA meds reviewed. Pt s/p lysis, heparin  to resumed 6h after sheath removal (12/19 ~1105).   Heparin  level therapeutic today. Hgb down to 6.7. Plan for PRBC.   Goal of Therapy:  Heparin  level 0.3-0.5 units/ml Monitor platelets by anticoagulation protocol: Yes  Plan: Cont heparin  gtt 900 units/hr Daily heparin  level and CBC  Sergio Batch, PharmD, BCIDP, AAHIVP, CPP Infectious Disease Pharmacist 05/22/2024 9:45 AM              [1]  Allergies Allergen Reactions   Penicillins Anaphylaxis, Swelling, Rash and Other (See Comments)    Immediate rash, facial/tongue/throat swelling, SOB or lightheadedness with hypotension

## 2024-05-22 NOTE — TOC Progression Note (Signed)
 Transition of Care [facility]) - Progression Note    Patient Details  Name: [name] MRN: [id number] Date of Birth: 1940/12/20  [facility] of [facility]) CM/SW Contact  Skye Rodarte LITTIE Moose, [place] Phone Number: 05/22/2024, 2:28 PM  Clinical Narrative:    Pt currently Ox[facility]. CSW contacted pt significant other, [name], to send him a list of SNF bed offers. [name] provided CSW with his email and CSW read back email address provided to confirm it was correct.  CSW could not get email containing bed offers to successfully send to [facility]. CSW provided pt with bed offers at bedside and contacted [name] to inform him a hard copy had also been provided to pt. CSW to follow up on SNF choice.   Expected Discharge Plan: Skilled Nursing Facility Barriers to Discharge: Continued Medical Work up               Expected Discharge Plan and Services In-house Referral: Clinical Social Work     Living arrangements for the past 2 months: Single Family Home                                       Social Drivers of Health (SDOH) Interventions SDOH Screenings   Food Insecurity: Patient Unable To Answer (05/16/2024)  Housing: Patient Unable To Answer (05/16/2024)  Transportation Needs: Patient Unable To Answer (05/16/2024)  Recent Concern: Transportation Needs - Unmet Transportation Needs (04/13/2024)  Utilities: Not At Risk (04/20/2024)  Alcohol Screen: Low Risk (11/14/2023)  Depression (PHQ2-9): High Risk (04/21/2024)  Financial Resource Strain: Medium Risk (03/20/2024)  Physical Activity: Insufficiently Active (11/14/2023)  Social Connections: Unknown (05/16/2024)  Stress: Stress Concern Present (11/14/2023)  Tobacco Use: Medium Risk (05/18/2024)  Health Literacy: Adequate Health Literacy (11/14/2023)    Readmission Risk Interventions    05/14/2024   11:16 AM 07/15/2023   12:11 PM  Readmission Risk Prevention Plan  Post Dischage Appt  Complete  Medication Screening  Complete   Transportation Screening Complete Complete  PCP or Specialist Appt within 3-5 Days Complete   HRI or Home Care Consult Complete   Social Work Consult for Recovery Care Planning/Counseling Complete   Palliative Care Screening Not Applicable   Medication Review Oceanographer) Referral to Pharmacy

## 2024-05-22 NOTE — Progress Notes (Addendum)
 "    Progress Note    [name]  FMW:[id number] DOB: 07-Oct-1940  DOA: 05/13/2024 PCP: Zollie Lowers, MD      Brief Narrative:    Medical records reviewed and are as summarized below:  [name] is a 83 y.o. female  with a history of hypertension, hyperlipidemia, CAD, CKD stage IIIa, HFpEF, stroke, COPD, renal artery stenosis, PAD, GERD, restless leg syndrome. Patient presented secondary to foot and chest pain with evidence of critical limb ischemia involving her right lower extremity. Vascular surgery consulted. 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   Acute limb ischemia of right foot Peripheral vascular disease Vascular surgery consulted. Patient underwent thrombolysis of the right lower extremity 12/18-12/19. Patient with obvious ischemic disease. 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  -Vascular surgery recommendations noted -Continue aspirin  and Plavix . - Discontinue IV heparin  drip.   Anemia of chronic kidney disease Acute blood loss anemia Hemoglobin down from 8.1-6.7. Transfuse 1 unit of PRBCs for H&H.   Hypophosphatemia Treated with IV sodium phosphate  infusion, monitor levels.   Acute thrombocytopenia Resolved   Acute metabolic encephalopathy Cognitive impairment at baseline.  Mental status improved.   Presumed secondary to infection. Has had intermittent confusion. Delirium  precautions    AKI on CKD stage IIIa Baseline creatinine of around 1.1-1.4. Creatinine of 2.65 on admission. AKI improved and renal function is back to baseline.    Chronic HFpEF Noted and stable. Last LVEF of 55-60% with grade 1 diastolic dysjunction in June 2025.     Comorbidities include nonobstructive history of stroke and CAD on aspirin  and Plavix , COPD, hyperlipidemia, hypertension    Diet Order             Diet regular Room service appropriate? 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    Medications:    sodium chloride    Intravenous Once   aspirin  EC  81 mg Oral Q breakfast   atorvastatin   80 mg Oral Daily   clopidogrel   75 mg Oral Daily   ezetimibe   10 mg Oral Daily   feeding supplement  237 mL Oral BID BM   fluticasone  furoate-vilanterol  1 puff Inhalation Daily   pantoprazole   40 mg Oral Daily   pregabalin   300 mg Oral QHS   rOPINIRole   1 mg Oral QHS   sodium chloride  flush  3 mL Intravenous Q12H   Continuous Infusions:  heparin  900 Units/hr (05/21/24 2335)     Anti-infectives (From admission, onward)    Start     Dose/Rate Route Frequency Ordered Stop   05/16/24 0600  vancomycin  (VANCOCIN ) IVPB 1000 mg/200 mL premix  Status:  Discontinued  1,000 mg 200 mL/hr over 60 Minutes Intravenous Every 48 hours 05/14/24 0844 05/14/24 0942   05/15/24 2200  meropenem  (MERREM ) 1 g in sodium chloride  0.9 % 100 mL IVPB        1 g 200 mL/hr over 30 Minutes Intravenous Every 12 hours 05/15/24 0925 05/19/24 2155   05/14/24 1000  meropenem  (MERREM ) 500 mg in sodium chloride  0.9 % 100 mL IVPB  Status:  Discontinued        500 mg 200 mL/hr over 30 Minutes Intravenous Every 12 hours 05/14/24 0844 05/15/24 0940   05/14/24 0600  vancomycin  (VANCOREADY) IVPB 500 mg/100 mL  Status:  Discontinued        500 mg 100 mL/hr over 60 Minutes  Intravenous Every 48 hours 05/13/24 1552 05/14/24 0844   05/13/24 2200  meropenem  (MERREM ) 1 g in sodium chloride  0.9 % 100 mL IVPB  Status:  Discontinued        1 g 200 mL/hr over 30 Minutes Intravenous Every 24 hours 05/13/24 1552 05/14/24 0844   05/13/24 0330  meropenem  (MERREM ) 1 g in sodium chloride  0.9 % 100 mL IVPB       Placed in And Linked Group   1 g 200 mL/hr over 30 Minutes Intravenous  Once 05/13/24 0329 05/13/24 0424   05/13/24 0330  vancomycin  (VANCOCIN ) IVPB 1000 mg/200 mL premix       Placed in And Linked Group   1,000 mg 200 mL/hr over 60 Minutes Intravenous  Once 05/13/24 0329 05/13/24 0524              Family Communication/Anticipated D/C date and plan/Code Status   DVT prophylaxis: SCD's Start: 05/18/24 1603 SCDs Start: 05/13/24 1541 Place TED hose Start: 05/13/24 1541     Code Status: Full Code  Family Communication: None Disposition Plan: Plan to discharge to SNF   Status is: Inpatient Remains inpatient appropriate because: Right AKA       Subjective:   Interval events noted.  She still has pain at the right AKA stump site.  She feels better.  Objective:    Vitals:   05/21/24 2327 05/22/24 0300 05/22/24 0810 05/22/24 1142  BP: (!) 142/47 (!) 141/53 (!) 105/44 (!) 102/42  Pulse: (!) 110 (!) 41 86 84  Resp: 18 20 18 13   Temp: 97.8 F (36.6 C) 97.9 F (36.6 C) 98.7 F (37.1 C) 97.7 F (36.5 C)  TempSrc: Oral Oral Oral Oral  SpO2: 98% 99% 100% 100%  Weight:      Height:       No data found.   Intake/Output Summary (Last 24 hours) at 05/22/2024 1420 Last data filed at 05/22/2024 0616 Gross per 24 hour  Intake 243 ml  Output 1550 ml  Net -1307 ml   Filed Weights   05/13/24 1500 05/20/24 0700  Weight: 50 kg 50 kg    Exam:   GEN: NAD SKIN: Warm and dry EYES: Pale, anicteric ENT: MMM CV: RRR PULM: CTA B ABD: soft, ND, NT, +BS CNS: AAO x 3, non focal EXT: Staples in the right AKA stump wound intact.  No  drainage.  Wound noted.      Data Reviewed:   I have personally reviewed following labs and imaging studies:  Labs: Labs show the following:   Basic Metabolic Panel: Recent Labs  Lab 05/16/24 0754 05/17/24 0257 05/19/24 0430 05/20/24 0344 05/21/24 0339  NA 141 139 140 141 141  K 3.6 3.4* 5.0 4.6 4.8  CL 111  110 112* 112* 110  CO2 22 23 21* 24 26  GLUCOSE 114* 122* 124* 96 99  BUN 20 20 17 19 18   CREATININE 1.09* 1.12* 1.04* 0.92 0.91  CALCIUM  7.5* 7.5* 7.8* 7.8* 7.8*  MG  --   --   --   --  2.0  PHOS  --   --   --   --  1.7*   GFR Estimated Creatinine Clearance: 36.6 mL/min (by C-G formula based on SCr of 0.91 mg/dL). Liver Function Tests: Recent Labs  Lab 05/17/24 0257 05/21/24 0339  AST 24 15  ALT <5 <5  ALKPHOS 41 36*  BILITOT 0.3 <0.2  PROT 4.5* 4.6*  ALBUMIN 2.0* 2.1*   No results for input(s): LIPASE, AMYLASE in the last 168 hours. No results for input(s): AMMONIA in the last 168 hours. Coagulation profile No results for input(s): INR, PROTIME in the last 168 hours.  CBC: Recent Labs  Lab 05/18/24 0429 05/19/24 0430 05/20/24 0344 05/20/24 0840 05/21/24 0339 05/21/24 0856 05/22/24 0423  WBC 8.9 9.5 8.5  --  8.3  --  8.8  NEUTROABS  --   --   --   --  5.3  --   --   HGB 8.3* 8.5* 7.1* 7.9* 6.8* 8.1* 6.7*  HCT 26.3* 26.0* 23.0* 25.8* 21.8* 25.8* 21.3*  MCV 90.4 91.9 91.6  --  92.8  --  90.6  PLT 65* 91* 136*  --  169  --  236   Cardiac Enzymes: No results for input(s): CKTOTAL, CKMB, CKMBINDEX, TROPONINI in the last 168 hours. BNP (last 3 results) No results for input(s): PROBNP in the last 8760 hours. CBG: Recent Labs  Lab 05/21/24 2206 05/21/24 2331 05/22/24 0358 05/22/24 0812 05/22/24 1143  GLUCAP 105* 112* 96 93 125*   D-Dimer: No results for input(s): DDIMER in the last 72 hours. Hgb A1c: No results for input(s): HGBA1C in the last 72 hours. Lipid Profile: No results for input(s): CHOL, HDL,  LDLCALC, TRIG, CHOLHDL, LDLDIRECT in the last 72 hours. Thyroid  function studies: No results for input(s): TSH, T4TOTAL, T3FREE, THYROIDAB in the last 72 hours.  Invalid input(s): FREET3 Anemia work up: No results for input(s): VITAMINB12, FOLATE, FERRITIN, TIBC, IRON, RETICCTPCT in the last 72 hours. Sepsis Labs: Recent Labs  Lab 05/19/24 0430 05/20/24 0344 05/21/24 0339 05/22/24 0423  WBC 9.5 8.5 8.3 8.8    Microbiology Recent Results (from the past 240 hours)  Blood Culture (routine x 2)     Status: None   Collection Time: 05/13/24  3:20 AM   Specimen: BLOOD  Result Value Ref Range Status   Specimen Description BLOOD RIGHT ANTECUBITAL  Final   Special Requests   Final    BOTTLES DRAWN AEROBIC AND ANAEROBIC Blood Culture adequate volume   Culture   Final    NO GROWTH 5 DAYS Performed at [facility], [address], [place], [place] [address]    Report Status 05/18/2024 FINAL  Final  Blood Culture (routine x 2)     Status: None   Collection Time: 05/13/24  4:00 AM   Specimen: BLOOD  Result Value Ref Range Status   Specimen Description BLOOD BLOOD LEFT ARM  Final   Special Requests AEROBIC BOTTLE ONLY Blood Culture adequate volume  Final   Culture   Final    NO GROWTH 5 DAYS Performed at [facility], [address] [place], [place] [address]    Report Status 05/18/2024 FINAL  Final  MRSA Next  Gen by PCR, Nasal     Status: None   Collection Time: 05/14/24  9:44 AM   Specimen: Nasal Mucosa; Nasal Swab  Result Value Ref Range Status   MRSA by PCR Next Gen NOT DETECTED NOT DETECTED Final    Comment: (NOTE) The GeneXpert MRSA Assay (FDA approved for NASAL specimens only), is one component of a comprehensive MRSA colonization surveillance program. It is not intended to diagnose MRSA infection nor to guide or monitor treatment for MRSA infections. Test performance is not FDA approved in patients less than 30 years old. Performed at [facility], [address], [place], [place] [address]     Procedures and diagnostic studies:  No results found.             LOS: 9 days   Orah Sonnen  [employer] Loss Adjuster on [email or web address]. If 7PM-7AM, please contact night-coverage at [email or web address]     05/22/2024, 2:20 PM           "

## 2024-05-23 DIAGNOSIS — T8140XA Infection following a procedure, unspecified, initial encounter: Secondary | ICD-10-CM | POA: Diagnosis not present

## 2024-05-23 LAB — GLUCOSE, CAPILLARY
Glucose-Capillary: 118 mg/dL — ABNORMAL HIGH (ref 70–99)
Glucose-Capillary: 123 mg/dL — ABNORMAL HIGH (ref 70–99)
Glucose-Capillary: 140 mg/dL — ABNORMAL HIGH (ref 70–99)
Glucose-Capillary: 67 mg/dL — ABNORMAL LOW (ref 70–99)
Glucose-Capillary: 83 mg/dL (ref 70–99)
Glucose-Capillary: 95 mg/dL (ref 70–99)
Glucose-Capillary: 98 mg/dL (ref 70–99)

## 2024-05-23 LAB — CBC
HCT: 23.9 % — ABNORMAL LOW (ref 36.0–46.0)
Hemoglobin: 7.7 g/dL — ABNORMAL LOW (ref 12.0–15.0)
MCH: 30.2 pg (ref 26.0–34.0)
MCHC: 32.2 g/dL (ref 30.0–36.0)
MCV: 93.7 fL (ref 80.0–100.0)
Platelets: 273 K/uL (ref 150–400)
RBC: 2.55 MIL/uL — ABNORMAL LOW (ref 3.87–5.11)
RDW: 14.2 % (ref 11.5–15.5)
WBC: 8.1 K/uL (ref 4.0–10.5)
nRBC: 0 % (ref 0.0–0.2)

## 2024-05-23 LAB — RENAL FUNCTION PANEL
Albumin: 2 g/dL — ABNORMAL LOW (ref 3.5–5.0)
Anion gap: 6 (ref 5–15)
BUN: 15 mg/dL (ref 8–23)
CO2: 26 mmol/L (ref 22–32)
Calcium: 7.7 mg/dL — ABNORMAL LOW (ref 8.9–10.3)
Chloride: 108 mmol/L (ref 98–111)
Creatinine, Ser: 0.9 mg/dL (ref 0.44–1.00)
GFR, Estimated: >60 mL/min
Glucose, Bld: 83 mg/dL (ref 70–99)
Phosphorus: 2.8 mg/dL (ref 2.5–4.6)
Potassium: 4.3 mmol/L (ref 3.5–5.1)
Sodium: 140 mmol/L (ref 135–145)

## 2024-05-23 NOTE — Plan of Care (Signed)
  Problem: Clinical Measurements: Goal: Will remain free from infection Outcome: Progressing   Problem: Activity: Goal: Risk for activity intolerance will decrease Outcome: Progressing   Problem: Nutrition: Goal: Adequate nutrition will be maintained Outcome: Progressing   Problem: Pain Managment: Goal: General experience of comfort will improve and/or be controlled Outcome: Progressing

## 2024-05-23 NOTE — TOC Progression Note (Signed)
 Transition of Care [facility]) - Progression Note    Patient Details  Name: [name] MRN: [id number] Date of Birth: 18-May-1941  Transition of Care [facility]) CM/SW Contact  Hartley KATHEE Robertson, [place] Phone Number: 05/23/2024, 10:15 AM  Clinical Narrative:     CSW spoke with pt's spouse via phone, he states he didn't receive the bed offers by email as he didn't have enough data, he requested offers via text, offers sent via text. ICM will continue to follow.   Expected Discharge Plan: Skilled Nursing Facility Barriers to Discharge: Continued Medical Work up               Expected Discharge Plan and Services In-house Referral: Clinical Social Work     Living arrangements for the past 2 months: Single Family Home                                       Social Drivers of Health (SDOH) Interventions SDOH Screenings   Food Insecurity: Patient Unable To Answer (05/16/2024)  Housing: Patient Unable To Answer (05/16/2024)  Transportation Needs: Patient Unable To Answer (05/16/2024)  Recent Concern: Transportation Needs - Unmet Transportation Needs (04/13/2024)  Utilities: Not At Risk (04/20/2024)  Alcohol Screen: Low Risk (11/14/2023)  Depression (PHQ2-9): High Risk (04/21/2024)  Financial Resource Strain: Medium Risk (03/20/2024)  Physical Activity: Insufficiently Active (11/14/2023)  Social Connections: Unknown (05/16/2024)  Stress: Stress Concern Present (11/14/2023)  Tobacco Use: Medium Risk (05/18/2024)  Health Literacy: Adequate Health Literacy (11/14/2023)    Readmission Risk Interventions    05/14/2024   11:16 AM 07/15/2023   12:11 PM  Readmission Risk Prevention Plan  Post Dischage Appt  Complete  Medication Screening  Complete  Transportation Screening Complete Complete  PCP or Specialist Appt within 3-5 Days Complete   HRI or Home Care Consult Complete   Social Work Consult for Recovery Care Planning/Counseling Complete   Palliative Care Screening Not  Applicable   Medication Review Oceanographer) Referral to Pharmacy

## 2024-05-23 NOTE — Progress Notes (Addendum)
 "    Progress Note    [name]  FMW:[id number] DOB: [birth year]  DOA: 05/13/2024 PCP: Zollie Lowers, MD      Brief Narrative:    Medical records reviewed and are as summarized below:  [name] is a 83 y.o. female  with a history of hypertension, hyperlipidemia, CAD, CKD stage IIIa, HFpEF, stroke, COPD, renal artery stenosis, PAD, GERD, restless leg syndrome. Patient presented secondary to foot and chest pain with evidence of critical limb ischemia involving her right lower extremity. Vascular surgery consulted. 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   Acute limb ischemia of right foot Peripheral vascular disease Vascular surgery consulted. Patient underwent thrombolysis of the right lower extremity 12/18-12/19. Patient with obvious ischemic disease. 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  -Vascular surgery recommendations noted -Continue aspirin  and Plavix . - S/p treatment of IV heparin  drip.   Anemia of chronic kidney disease Acute blood loss anemia Hemoglobin up from 6.7-7.7. S/p transfusion with 1 unit of PRBCs on 05/22/2024 Hemoglobin was 11.4 on admission.   Hypophosphatemia Improved.   Acute thrombocytopenia Resolved   Acute metabolic encephalopathy Cognitive impairment at baseline.  Mental status improved.   Presumed secondary to infection. Has had intermittent confusion. Delirium  precautions    AKI on CKD stage IIIa Baseline creatinine of around 1.1-1.4. Creatinine of 2.65 on admission. AKI improved and renal function is back to baseline.    Chronic HFpEF Noted and stable. Last LVEF of 55-60% with grade 1 diastolic dysjunction in June 2025.     Comorbidities include nonobstructive history of stroke and CAD on aspirin  and Plavix , COPD, hyperlipidemia, hypertension    Diet Order             Diet regular Room service appropriate? 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    Medications:    sodium chloride    Intravenous Once   aspirin  EC  81 mg Oral Q breakfast   atorvastatin   80 mg Oral Daily   clopidogrel   75 mg Oral Daily   ezetimibe   10 mg Oral Daily   feeding supplement  237 mL Oral BID BM   fluticasone  furoate-vilanterol  1 puff Inhalation Daily   pantoprazole   40 mg Oral Daily   pregabalin   300 mg Oral QHS   rOPINIRole   1 mg Oral QHS   sodium chloride  flush  3 mL Intravenous Q12H   Continuous Infusions:     Anti-infectives (From admission, onward)    Start     Dose/Rate Route Frequency Ordered Stop   05/16/24 0600  vancomycin  (VANCOCIN ) IVPB 1000 mg/200 mL premix  Status:  Discontinued        1,000  mg 200 mL/hr over 60 Minutes Intravenous Every 48 hours 05/14/24 0844 05/14/24 0942   05/15/24 2200  meropenem  (MERREM ) 1 g in sodium chloride  0.9 % 100 mL IVPB        1 g 200 mL/hr over 30 Minutes Intravenous Every 12 hours 05/15/24 0925 05/19/24 2155   05/14/24 1000  meropenem  (MERREM ) 500 mg in sodium chloride  0.9 % 100 mL IVPB  Status:  Discontinued        500 mg 200 mL/hr over 30 Minutes Intravenous Every 12 hours 05/14/24 0844 05/15/24 0940   05/14/24 0600  vancomycin  (VANCOREADY) IVPB 500 mg/100 mL  Status:  Discontinued        500 mg 100 mL/hr over 60 Minutes Intravenous Every 48 hours 05/13/24 1552  05/14/24 0844   05/13/24 2200  meropenem  (MERREM ) 1 g in sodium chloride  0.9 % 100 mL IVPB  Status:  Discontinued        1 g 200 mL/hr over 30 Minutes Intravenous Every 24 hours 05/13/24 1552 05/14/24 0844   05/13/24 0330  meropenem  (MERREM ) 1 g in sodium chloride  0.9 % 100 mL IVPB       Placed in And Linked Group   1 g 200 mL/hr over 30 Minutes Intravenous  Once 05/13/24 0329 05/13/24 0424   05/13/24 0330  vancomycin  (VANCOCIN ) IVPB 1000 mg/200 mL premix       Placed in And Linked Group   1,000 mg 200 mL/hr over 60 Minutes Intravenous  Once 05/13/24 0329 05/13/24 0524              Family Communication/Anticipated D/C date and plan/Code Status   DVT prophylaxis: SCD's Start: 05/18/24 1603 SCDs Start: 05/13/24 1541 Place TED hose Start: 05/13/24 1541     Code Status: Full Code  Family Communication: None Disposition Plan: Plan to discharge to SNF   Status is: Inpatient Remains inpatient appropriate because: Right AKA       Subjective:   She has no complaints.  She feels better. Mamata, [name], was at the bedside  Objective:    Vitals:   05/23/24 0000 05/23/24 0433 05/23/24 0804 05/23/24 1140  BP: (!) 107/46 (!) 132/48 (!) 118/57 (!) 111/53  Pulse: 80 77 88 88  Resp: 15 14 20 11   Temp:  98.2 F (36.8 C) 98.7 F (37.1 C) 98.5 F (36.9 C)  TempSrc:  Oral Oral Oral  SpO2: 97% 97% 99% 97%  Weight:      Height:       No data found.   Intake/Output Summary (Last 24 hours) at 05/23/2024 1508 Last data filed at 05/23/2024 1400 Gross per 24 hour  Intake 483 ml  Output 300 ml  Net 183 ml   Filed Weights   05/13/24 1500 05/20/24 0700  Weight: 50 kg 50 kg    Exam:  GEN: NAD SKIN: Warm and dry EYES: No pallor or icterus ENT: MMM CV: RRR PULM: CTA B ABD: soft, ND, NT, +BS CNS: AAO x 2, memory impairment, non focal EXT: Right AKA stump wound is clean and dry.  Staples are intact.      Data Reviewed:   I have personally reviewed following  labs and imaging studies:  Labs: Labs show the following:   Basic Metabolic Panel: Recent Labs  Lab 05/17/24 0257 05/19/24 0430 05/20/24 0344 05/21/24 0339 05/23/24 0510  NA 139 140 141 141 140  K 3.4* 5.0 4.6 4.8 4.3  CL 110 112* 112* 110 108  CO2 23 21* 24  26 26  GLUCOSE 122* 124* 96 99 83  BUN 20 17 19 18 15   CREATININE 1.12* 1.04* 0.92 0.91 0.90  CALCIUM  7.5* 7.8* 7.8* 7.8* 7.7*  MG  --   --   --  2.0  --   PHOS  --   --   --  1.7* 2.8   GFR Estimated Creatinine Clearance: 37 mL/min (by C-G formula based on SCr of 0.9 mg/dL). Liver Function Tests: Recent Labs  Lab 05/17/24 0257 05/21/24 0339 05/23/24 0510  AST 24 15  --   ALT <5 <5  --   ALKPHOS 41 36*  --   BILITOT 0.3 <0.2  --   PROT 4.5* 4.6*  --   ALBUMIN 2.0* 2.1* 2.0*   No results for input(s): LIPASE, AMYLASE in the last 168 hours. No results for input(s): AMMONIA in the last 168 hours. Coagulation profile No results for input(s): INR, PROTIME in the last 168 hours.  CBC: Recent Labs  Lab 05/19/24 0430 05/20/24 0344 05/20/24 0840 05/21/24 0339 05/21/24 0856 05/22/24 0423 05/23/24 0510  WBC 9.5 8.5  --  8.3  --  8.8 8.1  NEUTROABS  --   --   --  5.3  --   --   --   HGB 8.5* 7.1* 7.9* 6.8* 8.1* 6.7* 7.7*  HCT 26.0* 23.0* 25.8* 21.8* 25.8* 21.3* 23.9*  MCV 91.9 91.6  --  92.8  --  90.6 93.7  PLT 91* 136*  --  169  --  236 273   Cardiac Enzymes: No results for input(s): CKTOTAL, CKMB, CKMBINDEX, TROPONINI in the last 168 hours. BNP (last 3 results) No results for input(s): PROBNP in the last 8760 hours. CBG: Recent Labs  Lab 05/22/24 2340 05/23/24 0431 05/23/24 0808 05/23/24 0904 05/23/24 1142  GLUCAP 101* 83 67* 95 98   D-Dimer: No results for input(s): DDIMER in the last 72 hours. Hgb A1c: No results for input(s): HGBA1C in the last 72 hours. Lipid Profile: No results for input(s): CHOL, HDL, LDLCALC, TRIG, CHOLHDL, LDLDIRECT in the last 72  hours. Thyroid  function studies: No results for input(s): TSH, T4TOTAL, T3FREE, THYROIDAB in the last 72 hours.  Invalid input(s): FREET3 Anemia work up: No results for input(s): VITAMINB12, FOLATE, FERRITIN, TIBC, IRON, RETICCTPCT in the last 72 hours. Sepsis Labs: Recent Labs  Lab 05/20/24 0344 05/21/24 0339 05/22/24 0423 05/23/24 0510  WBC 8.5 8.3 8.8 8.1    Microbiology Recent Results (from the past 240 hours)  MRSA Next Gen by PCR, Nasal     Status: None   Collection Time: 05/14/24  9:44 AM   Specimen: Nasal Mucosa; Nasal Swab  Result Value Ref Range Status   MRSA by PCR Next Gen NOT DETECTED NOT DETECTED Final    Comment: (NOTE) The GeneXpert MRSA Assay (FDA approved for NASAL specimens only), is one component of a comprehensive MRSA colonization surveillance program. It is not intended to diagnose MRSA infection nor to guide or monitor treatment for MRSA infections. Test performance is not FDA approved in patients less than 63 years old. Performed at [facility], [address] [place], [place] [address]     Procedures and diagnostic studies:  No results found.             LOS: 10 days   Jazari Ober  [facility]   Pager on [email or web address]. If 7PM-7AM, please contact night-coverage at [email or web address]     05/23/2024, 3:08 PM           "

## 2024-05-24 DIAGNOSIS — T8140XA Infection following a procedure, unspecified, initial encounter: Secondary | ICD-10-CM | POA: Diagnosis not present

## 2024-05-24 LAB — CBC
HCT: 19.9 % — ABNORMAL LOW (ref 36.0–46.0)
Hemoglobin: 6.3 g/dL — CL (ref 12.0–15.0)
MCH: 29.2 pg (ref 26.0–34.0)
MCHC: 31.7 g/dL (ref 30.0–36.0)
MCV: 92.1 fL (ref 80.0–100.0)
Platelets: 297 K/uL (ref 150–400)
RBC: 2.16 MIL/uL — ABNORMAL LOW (ref 3.87–5.11)
RDW: 14.4 % (ref 11.5–15.5)
WBC: 9.7 K/uL (ref 4.0–10.5)
nRBC: 0 % (ref 0.0–0.2)

## 2024-05-24 LAB — GLUCOSE, CAPILLARY
Glucose-Capillary: 102 mg/dL — ABNORMAL HIGH (ref 70–99)
Glucose-Capillary: 83 mg/dL (ref 70–99)
Glucose-Capillary: 123 mg/dL — ABNORMAL HIGH (ref 70–99)
Glucose-Capillary: 134 mg/dL — ABNORMAL HIGH (ref 70–99)
Glucose-Capillary: 127 mg/dL — ABNORMAL HIGH (ref 70–99)

## 2024-05-24 LAB — PREPARE RBC (CROSSMATCH)

## 2024-05-24 LAB — HEMOGLOBIN AND HEMATOCRIT, BLOOD
HCT: 26.5 % — ABNORMAL LOW (ref 36.0–46.0)
Hemoglobin: 8.5 g/dL — ABNORMAL LOW (ref 12.0–15.0)

## 2024-05-24 MED ORDER — SODIUM CHLORIDE 0.9% IV SOLUTION: Freq: Once | INTRAVENOUS | Status: AC

## 2024-05-24 NOTE — Progress Notes (Signed)
 [facility] Progress Note  Patient: [name]    FMW:[id number]  DOA: 05/13/2024     Date of Service: the patient was seen and examined on 05/24/2024  Chief Complaint  Patient presents with   Foot Pain   Chest Pain   Brief hospital course:  Medical records reviewed and are as summarized below:   [name] is a 83 y.o. female  with a history of hypertension, hyperlipidemia, CAD, CKD stage IIIa, HFpEF, stroke, COPD, renal artery stenosis, PAD, GERD, restless leg syndrome. Patient presented secondary to foot and chest pain with evidence of critical limb ischemia involving her right lower extremity. Vascular surgery consulted. 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   Assessment and Plan:   Acute limb ischemia of right foot Peripheral vascular disease Vascular surgery consulted. Patient underwent thrombolysis of the right lower extremity 12/18-12/19. Patient with obvious ischemic disease. 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  -Vascular surgery recommendations noted -Continue aspirin  and Plavix . - S/p treatment of IV heparin  drip.     Anemia of chronic kidney disease Acute blood loss anemia Hemoglobin up from 6.7-7.7. S/p transfusion with 1 unit of PRBCs on 05/22/2024 Hemoglobin was 11.4 on admission. 12/28 Hb 6.3>> 1 unit of PRBC> Hb 8.5   Hypophosphatemia: Improved.     Acute thrombocytopenia: Resolved     Acute metabolic encephalopathy Cognitive impairment at baseline.  Mental status improved.   Presumed secondary to infection. Has had intermittent confusion. Delirium precautions     AKI on CKD stage IIIa Baseline creatinine of around 1.1-1.4. Creatinine of 2.65 on admission. AKI improved and renal function is back to baseline.     Chronic HFpEF Noted and stable. Last LVEF of 55-60% with grade 1 diastolic dysjunction in June 2025.       Comorbidities include nonobstructive history  of stroke and CAD on aspirin  and Plavix , COPD, hyperlipidemia, hypertension   Body mass index is 20.84 kg/m.  Interventions:   Diet: Regular diet DVT Prophylaxis: SCDs, already on DAPT  Advance goals of care discussion: Full code  Family Communication: family was not present at bedside, at the time of interview.  The pt provided permission to discuss medical plan with the family. Opportunity was given to ask question and all questions were answered satisfactorily.   Disposition:  Pt is from home, admitted with RLE critical limb ischemia s/p AKA, developed acute blood loss anemia, s/p PRBC transfusion Discharge to SNF, when stable, most likely in 1 to 2 days.  Subjective: No significant events overnight.  Complaining of pain 6/10 in the right AKA, received 1 unit PRBC transfusion today.  Denied any other complaints.  Physical Exam: General: NAD, lying comfortably Appear in no distress, affect appropriate Eyes: PERRLA ENT: Oral Mucosa Clear, moist  Neck: no JVD,  Cardiovascular: S1 and S2 Present, no Murmur,  Respiratory: good respiratory effort, Bilateral Air entry equal and Decreased, no Crackles, no wheezes Abdomen: Bowel Sound present, Soft and no tenderness,  Skin: no rashes Extremities: s/p Right AKA, dressing CDI Neurologic: without any new focal findings Gait not checked due to patient safety concerns  Vitals:   05/24/24 0758 05/24/24 0816 05/24/24 1236 05/24/24 1638  BP: (!) 131/57 (!) 138/47 (!) 121/54 (!) 128/43  Pulse: 63 65 90 (!) 52  Resp: 17 16 14 19   Temp: 98.5 F (36.9 C) 98.3 F (36.8 C) 98.3 F (36.8 C) (!) 97.5 F (36.4 C)  TempSrc: Oral Oral Oral Oral  SpO2:  91% 92% 96% 98%  Weight:      Height:        Intake/Output Summary (Last 24 hours) at 05/24/2024 1730 Last data filed at 05/24/2024 1500 Gross per 24 hour  Intake 1038 ml  Output --  Net 1038 ml   Filed Weights   05/13/24 1500 05/20/24 0700  Weight: 50 kg 50 kg    Data  Reviewed: I have personally reviewed and interpreted daily labs, tele strips, imagings as discussed above. I reviewed all nursing notes, pharmacy notes, vitals, pertinent old records I have discussed plan of care as described above with RN and patient/family.  CBC: Recent Labs  Lab 05/20/24 0344 05/20/24 0840 05/21/24 0339 05/21/24 0856 05/22/24 0423 05/23/24 0510 05/24/24 0332 05/24/24 1508  WBC 8.5  --  8.3  --  8.8 8.1 9.7  --   NEUTROABS  --   --  5.3  --   --   --   --   --   HGB 7.1*   < > 6.8* 8.1* 6.7* 7.7* 6.3* 8.5*  HCT 23.0*   < > 21.8* 25.8* 21.3* 23.9* 19.9* 26.5*  MCV 91.6  --  92.8  --  90.6 93.7 92.1  --   PLT 136*  --  169  --  236 273 297  --    < > = values in this interval not displayed.   Basic Metabolic Panel: Recent Labs  Lab 05/19/24 0430 05/20/24 0344 05/21/24 0339 05/23/24 0510  NA 140 141 141 140  K 5.0 4.6 4.8 4.3  CL 112* 112* 110 108  CO2 21* 24 26 26   GLUCOSE 124* 96 99 83  BUN 17 19 18 15   CREATININE 1.04* 0.92 0.91 0.90  CALCIUM  7.8* 7.8* 7.8* 7.7*  MG  --   --  2.0  --   PHOS  --   --  1.7* 2.8    Studies: No results found.  Scheduled Meds:  sodium chloride    Intravenous Once   aspirin  EC  81 mg Oral Q breakfast   atorvastatin   80 mg Oral Daily   clopidogrel   75 mg Oral Daily   ezetimibe   10 mg Oral Daily   feeding supplement  237 mL Oral BID BM   fluticasone  furoate-vilanterol  1 puff Inhalation Daily   pantoprazole   40 mg Oral Daily   pregabalin   300 mg Oral QHS   rOPINIRole   1 mg Oral QHS   sodium chloride  flush  3 mL Intravenous Q12H   Continuous Infusions: PRN Meds: acetaminophen  **OR** acetaminophen , acetaminophen , albuterol , bisacodyl , hydrALAZINE , HYDROmorphone  (DILAUDID ) injection, labetalol , morphine  injection, naLOXone  (NARCAN )  injection, ondansetron  **OR** ondansetron  (ZOFRAN ) IV, oxyCODONE , polyethylene glycol, traZODone   Time spent: 35 minutes  Author: ELVAN SOR. MD [facility] 05/24/2024 5:30  PM  To reach On-call, see care teams to locate the attending and reach out to them via [email or web address]. If 7PM-7AM, please contact night-coverage If you still have difficulty reaching the attending provider, please page the [facility] (Director on Call) for [facility] on amion for assistance.

## 2024-05-24 NOTE — Progress Notes (Signed)
 Notified by [facility] of critical Hgb of 6.3, on call [facility]. Cleatus notified with order to administer 1 unit of RBCs.

## 2024-05-24 NOTE — Plan of Care (Signed)
" °  Problem: Clinical Measurements: Goal: Will remain free from infection Outcome: Progressing   Problem: Activity: Goal: Risk for activity intolerance will decrease Outcome: Progressing   Problem: Nutrition: Goal: Adequate nutrition will be maintained Outcome: Progressing   Problem: Pain Managment: Goal: General experience of comfort will improve and/or be controlled Outcome: Progressing   Problem: Safety: Goal: Ability to remain free from injury will improve Outcome: Progressing   Problem: Skin Integrity: Goal: Risk for impaired skin integrity will decrease Outcome: Progressing   Problem: Cardiovascular: Goal: Ability to achieve and maintain adequate cardiovascular perfusion will improve Outcome: Progressing Goal: Vascular access site(s) Level 0-1 will be maintained Outcome: Progressing   Problem: Pain Management: Goal: Pain level will decrease with appropriate interventions Outcome: Progressing   "

## 2024-05-25 DIAGNOSIS — T8140XA Infection following a procedure, unspecified, initial encounter: Secondary | ICD-10-CM | POA: Diagnosis not present

## 2024-05-25 LAB — BASIC METABOLIC PANEL WITH GFR
Anion gap: 5 (ref 5–15)
BUN: 22 mg/dL (ref 8–23)
CO2: 28 mmol/L (ref 22–32)
Calcium: 7.6 mg/dL — ABNORMAL LOW (ref 8.9–10.3)
Chloride: 107 mmol/L (ref 98–111)
Creatinine, Ser: 1.03 mg/dL — ABNORMAL HIGH (ref 0.44–1.00)
GFR, Estimated: 54 mL/min — ABNORMAL LOW
Glucose, Bld: 96 mg/dL (ref 70–99)
Potassium: 4.6 mmol/L (ref 3.5–5.1)
Sodium: 140 mmol/L (ref 135–145)

## 2024-05-25 LAB — CBC
HCT: 25.9 % — ABNORMAL LOW (ref 36.0–46.0)
Hemoglobin: 8.4 g/dL — ABNORMAL LOW (ref 12.0–15.0)
MCH: 29.1 pg (ref 26.0–34.0)
MCHC: 32.4 g/dL (ref 30.0–36.0)
MCV: 89.6 fL (ref 80.0–100.0)
Platelets: 298 K/uL (ref 150–400)
RBC: 2.89 MIL/uL — ABNORMAL LOW (ref 3.87–5.11)
RDW: 15.9 % — ABNORMAL HIGH (ref 11.5–15.5)
WBC: 11.8 K/uL — ABNORMAL HIGH (ref 4.0–10.5)
nRBC: 0 % (ref 0.0–0.2)

## 2024-05-25 LAB — BPAM RBC
Blood Product Expiration Date: 202601232359
ISSUE DATE / TIME: 202512280525
Unit Type and Rh: 7300

## 2024-05-25 LAB — GLUCOSE, CAPILLARY
Glucose-Capillary: 100 mg/dL — ABNORMAL HIGH (ref 70–99)
Glucose-Capillary: 101 mg/dL — ABNORMAL HIGH (ref 70–99)
Glucose-Capillary: 104 mg/dL — ABNORMAL HIGH (ref 70–99)
Glucose-Capillary: 112 mg/dL — ABNORMAL HIGH (ref 70–99)
Glucose-Capillary: 113 mg/dL — ABNORMAL HIGH (ref 70–99)
Glucose-Capillary: 86 mg/dL (ref 70–99)

## 2024-05-25 LAB — TYPE AND SCREEN
ABO/RH(D): B POS
Antibody Screen: NEGATIVE
Unit division: 0

## 2024-05-25 LAB — PHOSPHORUS: Phosphorus: 2.5 mg/dL (ref 2.5–4.6)

## 2024-05-25 LAB — MAGNESIUM: Magnesium: 2.1 mg/dL (ref 1.7–2.4)

## 2024-05-25 NOTE — Progress Notes (Signed)
 Physical Therapy Treatment Patient Details Name: [name] MRN: [id number] DOB: 1940-12-29 Today's Date: 05/25/2024   History of Present Illness 83 yo F adm 05/13/24 with CP and foot pain. 12/19 RLE angiogram,thrombectomy of superficial femoral artery, popliteal artery, tibioperoneal trunk and peroneal artery. 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 S/p aortogram with stent placement of R superficial femoral artery and thrombectomy of R superficial femoral-popliteal artery 11/18,COPD, HTN, CVA, renal artery stenosis, CHF, NSTEMI, tobacco use, and gout.    PT Comments  Pt resting in bed on arrival, agreeable to session, however limited by pain with attempts at sitting. Pt able to roll R/L with min A to CGA, with cues for UE/LE placement, for assist with peri-care. Pt unable to tolerate sitting up EOB due to pain at bottom. Pt able to tolerate sitting with HOB elevated with no increased pain. Pt performing RLE exercises for increased ROM and strength maintenance. Pt continues to benefit from skilled PT services to progress toward functional mobility goals.     If plan is discharge home, recommend the following: Two people to help with walking and/or transfers;Two people to help with bathing/dressing/bathroom;Assistance with feeding;Assistance with cooking/housework;Assist for transportation;Help with stairs or ramp for entrance;Direct supervision/assist for medications management;Direct supervision/assist for financial management   Can travel by private vehicle     No  Equipment Recommendations  None recommended by PT (TBD next venue)    Recommendations for Other Services       Precautions / Restrictions Precautions Precautions: Fall Restrictions Weight Bearing Restrictions Per Provider Order: No RLE Weight Bearing Per Provider Order: Non weight bearing     Mobility  Bed Mobility Overal bed mobility: Needs Assistance Bed Mobility: Rolling, Sidelying to Sit Rolling:  Min assist, Contact guard assist         General bed mobility comments: cues for reaching across to rail and to bend LLE to assist, CGA to R and and min A to L, pt coming to L sidelying in preperation to sit EOB, however pt stating pain at rectum too severe to sit up    Transfers Overall transfer level: Needs assistance                      Ambulation/Gait               General Gait Details: unable   Stairs             Wheelchair Mobility     Tilt Bed    Modified Rankin (Stroke Patients Only)       Balance Overall balance assessment: Needs assistance, History of Falls                                          Communication Communication Communication: Impaired Factors Affecting Communication: Reduced clarity of speech;Difficulty expressing self  Cognition Arousal: Alert Behavior During Therapy: WFL for tasks assessed/performed   PT - Cognitive impairments: Memory, Problem solving, Sequencing                         Following commands: Impaired      Cueing Cueing Techniques: Verbal cues, Gestural cues, Tactile cues, Visual cues  Exercises Amputee Exercises Quad Sets: AROM, Right, 5 reps Hip ABduction/ADduction: AROM, Right, 5 reps Straight Leg Raises: AROM, Right, 5 reps    General  Comments General comments (skin integrity, edema, etc.): VSS on RA      Pertinent Vitals/Pain Pain Assessment Pain Assessment: Faces Faces Pain Scale: Hurts whole lot Pain Location: rectum in sitting Pain Descriptors / Indicators: Burning Pain Intervention(s): Monitored during session, Limited activity within patient's tolerance, Repositioned, Patient requesting pain meds-RN notified    Home Living                          Prior Function            PT Goals (current goals can now be found in the care plan section) Acute Rehab PT Goals Patient Stated Goal: none stated PT Goal Formulation: Patient unable to  participate in goal setting Time For Goal Achievement: 06/02/24 Progress towards PT goals: Progressing toward goals    Frequency    Min 2X/week      PT Plan      Co-evaluation              AM-PAC PT 6 Clicks Mobility   Outcome Measure  Help needed turning from your back to your side while in a flat bed without using bedrails?: Total Help needed moving from lying on your back to sitting on the side of a flat bed without using bedrails?: Total Help needed moving to and from a bed to a chair (including a wheelchair)?: Total Help needed standing up from a chair using your arms (e.g., wheelchair or bedside chair)?: Total Help needed to walk in hospital room?: Total Help needed climbing 3-5 steps with a railing? : Total 6 Click Score: 6    End of Session   Activity Tolerance: Patient limited by pain Patient left: in bed;with call bell/phone within reach;with bed alarm set;with family/visitor present Nurse Communication: Mobility status;Patient requests pain meds PT Visit Diagnosis: Muscle weakness (generalized) (M62.81);Difficulty in walking, not elsewhere classified (R26.2);Pain Pain - Right/Left: Right Pain - part of body: Leg     Time: 8487-8464 PT Time Calculation (min) (ACUTE ONLY): 23 min  Charges:    $Therapeutic Activity: 23-37 mins PT General Charges $$ ACUTE PT VISIT: 1 Visit                     Addilee Neu R. PTA Acute Rehabilitation Services Office: [phone]   Therisa CHRISTELLA Boor 05/25/2024, 3:57 PM

## 2024-05-25 NOTE — Plan of Care (Signed)
" °  Problem: Education: Goal: Knowledge of General Education information will improve Description: Including pain rating scale, medication(s)/side effects and non-pharmacologic comfort measures 05/25/2024 0531 by Shivam Mestas K, RN Outcome: Progressing 05/25/2024 0511 by Merilyn Merlynn POUR, RN Outcome: Progressing   Problem: Health Behavior/Discharge Planning: Goal: Ability to manage health-related needs will improve 05/25/2024 0531 by Deloras Reichard K, RN Outcome: Progressing 05/25/2024 0511 by Merilyn Merlynn POUR, RN Outcome: Progressing   Problem: Clinical Measurements: Goal: Ability to maintain clinical measurements within normal limits will improve 05/25/2024 0531 by Jaishon Krisher K, RN Outcome: Progressing 05/25/2024 0511 by Michaelle Bottomley K, RN Outcome: Progressing   "

## 2024-05-25 NOTE — Progress Notes (Signed)
 [facility] Progress Note  Patient: [name]    FMW:[id number]  DOA: 05/13/2024     Date of Service: the patient was seen and examined on 05/25/2024  Chief Complaint  Patient presents with   Foot Pain   Chest Pain   Brief hospital course:  Medical records reviewed and are as summarized below:   [name] is a 83 y.o. female  with a history of hypertension, hyperlipidemia, CAD, CKD stage IIIa, HFpEF, stroke, COPD, renal artery stenosis, PAD, GERD, restless leg syndrome. Patient presented secondary to foot and chest pain with evidence of critical limb ischemia involving her right lower extremity. Vascular surgery consulted. 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   Assessment and Plan:   Acute limb ischemia of right foot Peripheral vascular disease Vascular surgery consulted. Patient underwent thrombolysis of the right lower extremity 12/18-12/19. Patient with obvious ischemic disease. 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  -Vascular surgery recommendations noted -Continue aspirin  and Plavix . - S/p treatment of IV heparin  drip.     Anemia of chronic kidney disease Acute blood loss anemia Hemoglobin up from 6.7-7.7. S/p transfusion with 1 unit of PRBCs on 05/22/2024 Hemoglobin was 11.4 on admission. 12/28 Hb 6.3>> 1 unit of PRBC> Hb 8.5   Hypophosphatemia: Improved.     Acute thrombocytopenia: Resolved     Acute metabolic encephalopathy Cognitive impairment at baseline.  Mental status improved.   Presumed secondary to infection. Has had intermittent confusion. Delirium precautions     AKI on CKD stage IIIa Baseline creatinine of around 1.1-1.4. Creatinine of 2.65 on admission. AKI improved and renal function is back to baseline.     Chronic HFpEF Noted and stable. Last LVEF of 55-60% with grade 1 diastolic dysjunction in June 2025.       Comorbidities include nonobstructive history  of stroke and CAD on aspirin  and Plavix , COPD, hyperlipidemia, hypertension   Body mass index is 20.84 kg/m.  Interventions:   Diet: Regular diet DVT Prophylaxis: SCDs, already on DAPT  Advance goals of care discussion: Full code  Family Communication: family was not present at bedside, at the time of interview.  The pt provided permission to discuss medical plan with the family. Opportunity was given to ask question and all questions were answered satisfactorily.   Disposition:  Pt is from home, admitted with RLE critical limb ischemia s/p AKA, developed acute blood loss anemia, s/p PRBC transfusion Discharge to SNF, when bed will be available. Stable to discharge. Follow TOC for SNF placement   Subjective: No significant events overnight.  No pain at this time, pain is off-and-on.   Physical Exam: General: NAD, lying comfortably Appear in no distress, affect appropriate Eyes: PERRLA ENT: Oral Mucosa Clear, moist  Neck: no JVD,  Cardiovascular: S1 and S2 Present, no Murmur,  Respiratory: good respiratory effort, Bilateral Air entry equal and Decreased, no Crackles, no wheezes Abdomen: Bowel Sound present, Soft and no tenderness,  Skin: no rashes Extremities: s/p Right AKA, dressing CDI Neurologic: without any new focal findings Gait not checked due to patient safety concerns  Vitals:   05/25/24 1100 05/25/24 1200 05/25/24 1203 05/25/24 1637  BP:   (!) 108/44 113/78  Pulse: 79 80 79 91  Resp: 12 14 15 18   Temp:   99.8 F (37.7 C) 98.1 F (36.7 C)  TempSrc:   Oral Oral  SpO2: 95% 97% 97% 97%  Weight:      Height:  Intake/Output Summary (Last 24 hours) at 05/25/2024 1724 Last data filed at 05/24/2024 1826 Gross per 24 hour  Intake 240 ml  Output 600 ml  Net -360 ml   Filed Weights   05/13/24 1500 05/20/24 0700  Weight: 50 kg 50 kg    Data Reviewed: I have personally reviewed and interpreted daily labs, tele strips, imagings as discussed above. I  reviewed all nursing notes, pharmacy notes, vitals, pertinent old records I have discussed plan of care as described above with RN and patient/family.  CBC: Recent Labs  Lab 05/21/24 0339 05/21/24 0856 05/22/24 0423 05/23/24 0510 05/24/24 0332 05/24/24 1508 05/25/24 0406  WBC 8.3  --  8.8 8.1 9.7  --  11.8*  NEUTROABS 5.3  --   --   --   --   --   --   HGB 6.8*   < > 6.7* 7.7* 6.3* 8.5* 8.4*  HCT 21.8*   < > 21.3* 23.9* 19.9* 26.5* 25.9*  MCV 92.8  --  90.6 93.7 92.1  --  89.6  PLT 169  --  236 273 297  --  298   < > = values in this interval not displayed.   Basic Metabolic Panel: Recent Labs  Lab 05/19/24 0430 05/20/24 0344 05/21/24 0339 05/23/24 0510 05/25/24 0406  NA 140 141 141 140 140  K 5.0 4.6 4.8 4.3 4.6  CL 112* 112* 110 108 107  CO2 21* 24 26 26 28   GLUCOSE 124* 96 99 83 96  BUN 17 19 18 15 22   CREATININE 1.04* 0.92 0.91 0.90 1.03*  CALCIUM  7.8* 7.8* 7.8* 7.7* 7.6*  MG  --   --  2.0  --  2.1  PHOS  --   --  1.7* 2.8 2.5    Studies: No results found.  Scheduled Meds:  sodium chloride    Intravenous Once   aspirin  EC  81 mg Oral Q breakfast   atorvastatin   80 mg Oral Daily   clopidogrel   75 mg Oral Daily   ezetimibe   10 mg Oral Daily   feeding supplement  237 mL Oral BID BM   fluticasone  furoate-vilanterol  1 puff Inhalation Daily   pantoprazole   40 mg Oral Daily   pregabalin   300 mg Oral QHS   rOPINIRole   1 mg Oral QHS   sodium chloride  flush  3 mL Intravenous Q12H   Continuous Infusions: PRN Meds: acetaminophen  **OR** acetaminophen , acetaminophen , albuterol , bisacodyl , hydrALAZINE , HYDROmorphone  (DILAUDID ) injection, labetalol , morphine  injection, naLOXone  (NARCAN )  injection, ondansetron  **OR** ondansetron  (ZOFRAN ) IV, oxyCODONE , polyethylene glycol, traZODone   Time spent: 35 minutes  Author: ELVAN SOR. MD [facility] 05/25/2024 5:24 PM  To reach On-call, see care teams to locate the attending and reach out to them via  [email or web address]. If 7PM-7AM, please contact night-coverage If you still have difficulty reaching the attending provider, please page the [facility] (Director on Call) for [facility] on amion for assistance.

## 2024-05-25 NOTE — Plan of Care (Signed)

## 2024-05-25 NOTE — Progress Notes (Unsigned)
 "  Initial neurology clinic note  Reason for Evaluation: Consultation requested by Zollie Lowers, MD for an opinion regarding right sided pain and weakness. My final recommendations will be communicated back to the requesting physician by way of shared medical record or letter to requesting physician via US  mail.  HPI: This is [name], a 83 y.o. ***-handed female with a medical history of HTN, HLD, HFpEF, pericarditis, CAD, renal artery stenosis, CKD, COPD, stroke, PVD, low back pain, osteopenia, OA, gout*** who presents to neurology clinic with the chief complaint of right sided pain and weakness***. The patient is accompanied by ***.  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 On aspirin  and plavix  as per vascular surgery  The patient has not*** had similar episodes of symptoms in the past. ***  Muscle bulk loss? *** Muscle pain? ***  Cramps/Twitching? *** Suggestion of myotonia/difficulty relaxing after contraction? ***  Fatigable weakness?*** Does strength improve after brief exercise?***  Able to brush hair/teeth without difficulty? *** Able to button shirts/use zips? *** Clumsiness/dropping grasped objects?*** Can you arise from squatted position easily? *** Able to get out of chair without using arms? *** Able to walk up steps easily? *** Use an assistive device to walk? *** Significant imbalance with walking? *** Falls?*** Any change in urine color,  especially after exertion/physical activity? ***  The patient denies*** symptoms suggestive of oculobulbar weakness including diplopia, ptosis, dysphagia, poor saliva control, dysarthria/dysphonia, impaired mastication, facial weakness/droop.  There are no*** neuromuscular respiratory weakness symptoms, particularly orthopnea>dyspnea.   Pseudobulbar affect is absent***.  The patient does not*** report symptoms referable to autonomic dysfunction including impaired sweating, heat or cold intolerance, excessive mucosal dryness, gastroparetic early satiety, postprandial abdominal bloating, constipation, bowel or bladder dyscontrol, erectile dysfunction*** or syncope/presyncope/orthostatic intolerance.  There are no*** complaints relating to other symptoms of small fiber modalities including paresthesia/pain.  The patient has not *** noticed any recent skin rashes nor does he*** report any constitutional symptoms like fever, night sweats, anorexia or unintentional weight loss.  EtOH use: ***  Restrictive diet? *** Family history of neuropathy/myopathy/NM disease?***  Previous labs, electrodiagnostics, and neuroimaging are summarized below, but pertinent findings include***  Any biopsy done? *** Current medications being tried for the patient's symptoms include ***  Prior medications that have been tried: ***   MEDICATIONS:  Facility-Administered Encounter Medications as of 06/05/2024  Medication   0.9 %  sodium chloride  infusion (Manually program via Guardrails IV Fluids)   acetaminophen  (TYLENOL ) tablet 650 mg   Or   acetaminophen  (TYLENOL ) suppository 650 mg   acetaminophen  (TYLENOL ) tablet 650 mg   albuterol  (PROVENTIL ) (2.5 MG/3ML) 0.083% nebulizer solution 2.5 mg   aspirin  EC tablet 81 mg   atorvastatin  (LIPITOR ) tablet 80 mg   bisacodyl  (DULCOLAX) suppository 10 mg   clopidogrel  (PLAVIX ) tablet 75 mg   ezetimibe  (ZETIA ) tablet 10 mg   feeding supplement (ENSURE PLUS HIGH PROTEIN)  liquid 237 mL   fluticasone  furoate-vilanterol (BREO ELLIPTA ) 200-25 MCG/ACT 1  puff   hydrALAZINE  (APRESOLINE ) injection 10 mg   HYDROmorphone  (DILAUDID ) injection 0.5 mg   labetalol  (NORMODYNE ) injection 10 mg   morphine  (PF) 2 MG/ML injection 5 mg   naloxone  (NARCAN ) injection 0.1 mg   ondansetron  (ZOFRAN ) tablet 4 mg   Or   ondansetron  (ZOFRAN ) injection 4 mg   oxyCODONE  (Oxy IR/ROXICODONE ) immediate release tablet 5 mg   pantoprazole  (PROTONIX ) EC tablet 40 mg   polyethylene glycol (MIRALAX  / GLYCOLAX ) packet 17 g   pregabalin  (LYRICA ) capsule 300 mg   rOPINIRole  (REQUIP ) tablet 1 mg   sodium chloride  flush (NS) 0.9 % injection 3 mL   traZODone  (DESYREL ) tablet 50 mg   Outpatient Encounter Medications as of 06/05/2024  Medication Sig   acetaminophen  (TYLENOL ) 500 MG tablet Take 2 tablets (1,000 mg total) by mouth 3 (three) times daily.   albuterol  (VENTOLIN  HFA) 108 (90 Base) MCG/ACT inhaler Inhale 2 puffs into the lungs every 4 (four) hours as needed for wheezing or shortness of breath.   alendronate  (FOSAMAX ) 70 MG tablet Take 1 tablet (70 mg total) by mouth every 7 (seven) days. Take with a full glass of water  on an empty stomach.   amLODipine  (NORVASC ) 10 MG tablet Take 1 tablet (10 mg total) by mouth daily. To lower blood pressure.   aspirin  EC 81 MG tablet Take 1 tablet (81 mg total) by mouth daily. Swallow whole.   atorvastatin  (LIPITOR ) 80 MG tablet Take 1 tablet (80 mg total) by mouth daily. To lower cholesterol and protect leg stents.   clopidogrel  (PLAVIX ) 75 MG tablet Take 1 tablet (75 mg total) by mouth daily. To protect your leg stents.   fluticasone  furoate-vilanterol (BREO ELLIPTA ) 200-25 MCG/ACT AEPB Inhale 1 puff into the lungs daily.   furosemide  (LASIX ) 20 MG tablet TAKE 1 TABLET (20 MG TOTAL) BY MOUTH DAILY. FOR SWELLING (Patient taking differently: Take 20 mg by mouth as needed for fluid. For swelling)   isosorbide  mononitrate (IMDUR ) 60 MG 24 hr tablet Take 1  tablet (60 mg total) by mouth daily. To lower blood pressure.   pantoprazole  (PROTONIX ) 40 MG tablet Take 1 tablet (40 mg total) by mouth daily. (Patient taking differently: Take 40 mg by mouth as needed.)   potassium chloride  SA (KLOR-CON  M) 20 MEQ tablet Take 1 tablet (20 mEq total) by mouth daily. For potassium replacement/ supplement (Patient taking differently: Take 20 mEq by mouth as needed. For potassium replacement/ supplement)   pregabalin  (LYRICA ) 300 MG capsule Take 1 capsule (300 mg total) by mouth at bedtime. To reduce back pain   rOPINIRole  (REQUIP ) 1 MG tablet TAKE 1 TABLET (1 MG TOTAL) BY MOUTH AT BEDTIME. 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   Ileus (HCC) 05/15/2021   Influenza A 07/15/2023   NSTEMI (non-ST elevated myocardial infarction) (HCC) 07/10/2023   Stroke (HCC)    2023   Unintentional weight loss  03/19/2022    PAST SURGICAL HISTORY: Past Surgical History:  Procedure Laterality Date   ABDOMINAL AORTOGRAM W/LOWER EXTREMITY N/A 04/14/2024   Procedure: ABDOMINAL AORTOGRAM W/LOWER EXTREMITY;  Surgeon: Serene Gaile ORN, MD;  Location: [facility];  Service: Cardiovascular;  Laterality: N/A;   ABDOMINAL HYSTERECTOMY     AMPUTATION Right 05/18/2024   Procedure: AMPUTATION, ABOVE KNEE;  Surgeon: Gretta Lonni PARAS, MD;  Location: [facility] OR;  Service: Vascular;  Laterality: Right;   AMPUTATION TOE Right 04/22/2024   Procedure:  AMPUTATION FIFTH TOE OF RIGHT FOOT;  Surgeon: Malvin Marsa FALCON, DPM;  Location: [facility] OR;  Service: Orthopedics/Podiatry;  Laterality: Right;  AMPUTATION FIFTH TOE OF RIGHT FOOT   ANTERIOR CERVICAL CORPECTOMY  03/07/2012   Procedure: ANTERIOR CERVICAL CORPECTOMY;  Surgeon: Victory Gens, MD;  Location: [facility];  Service: Neurosurgery;  Laterality: N/A;  Cervical six-seven, cervical seven-thoracic one Anterior cervical decompression/diskectomy/fusion, with Cervical seven Corpectomy, reconstruction using Allograft and Alphatec plate   CATARACT EXTRACTION W/PHACO Left 04/12/2017   Procedure: CATARACT EXTRACTION PHACO AND INTRAOCULAR LENS PLACEMENT (IOC);  Surgeon: Harrie Agent, MD;  Location: [facility];  Service: Ophthalmology;  Laterality: Left;  CDE: 3.82   CATARACT EXTRACTION W/PHACO Right 05/03/2017   Procedure: CATARACT EXTRACTION PHACO AND INTRAOCULAR LENS PLACEMENT RIGHT EYE;  Surgeon: Harrie Agent, MD;  Location: [facility];  Service: Ophthalmology;  Laterality: Right;  CDE: 3.89   LEFT HEART CATH AND CORONARY ANGIOGRAPHY N/A 07/11/2023   Procedure: LEFT HEART CATH AND CORONARY ANGIOGRAPHY;  Surgeon: Elmira Newman PARAS, MD;  Location: [facility];  Service: Cardiovascular;  Laterality: N/A;   LOWER EXTREMITY ANGIOGRAPHY N/A 04/14/2024   Procedure: Lower Extremity Angiography;  Surgeon: Serene Gaile ORN, MD;  Location: [facility];  Service: Cardiovascular;  Laterality: N/A;   LOWER EXTREMITY ANGIOGRAPHY N/A 05/14/2024   Procedure: Lower Extremity Angiography;  Surgeon: Pearline Norman RAMAN, MD;  Location: [facility] INVASIVE CV LAB;  Service: Cardiovascular;  Laterality: N/A;   LOWER EXTREMITY INTERVENTION  04/14/2024   Procedure: LOWER EXTREMITY INTERVENTION;  Surgeon: Serene Gaile ORN, MD;  Location: [facility];  Service: Cardiovascular;;   LOWER EXTREMITY INTERVENTION  05/15/2024   Procedure: LOWER EXTREMITY INTERVENTION;  Surgeon: Lanis Fonda BRAVO, MD;  Location: [facility] INVASIVE  CV LAB;  Service: Cardiovascular;;   MULTIPLE TOOTH EXTRACTIONS     PERIPHERAL VASCULAR THROMBECTOMY N/A 05/15/2024   Procedure: PERIPHERAL VASCULAR THROMBECTOMY;  Surgeon: Lanis Fonda BRAVO, MD;  Location: [facility] INVASIVE CV LAB;  Service: Cardiovascular;  Laterality: N/A;   TONSILLECTOMY      ALLERGIES: Allergies[1]  FAMILY HISTORY: Family History  Adopted: Yes  Problem Relation Age of Onset   Bipolar disorder Daughter    Heart disease Daughter    Asthma Daughter    Bipolar disorder Son    Bipolar disorder Daughter    Bipolar disorder Daughter    Bipolar disorder Daughter    Hypertension Daughter    Heart disease Daughter    Drug abuse Daughter        OD    SOCIAL HISTORY: Social History[2] Social History   Social History Narrative   5 children living, 1 deceased.    Children live out of state.     OBJECTIVE: PHYSICAL EXAM: There were no vitals taken for this visit.  General:*** General appearance: Awake and alert. No distress. Cooperative with exam.  Skin: No obvious rash or jaundice. HEENT: Atraumatic. Anicteric. Lungs: Non-labored breathing on room air  Heart: Regular Abdomen: Soft,  non tender. Extremities: No edema. No obvious deformity.  Musculoskeletal: No obvious joint swelling. Psych: Affect appropriate.  Neurological: Mental Status: Alert. Speech fluent. No pseudobulbar affect Cranial Nerves: CNII: No RAPD. Visual fields grossly intact. CNIII, IV, VI: PERRL. No nystagmus. EOMI. CN V: Facial sensation intact bilaterally to fine touch. [place] clench strong. Jaw jerk***. CN VII: Facial muscles symmetric and strong. No ptosis at rest or after sustained upgaze***. CN VIII: Hearing grossly intact bilaterally. CN IX: No hypophonia. CN X: Palate elevates symmetrically. CN XI: Full strength shoulder shrug bilaterally. CN XII: Tongue protrusion full and midline. No atrophy or fasciculations. No significant dysarthria*** Motor: Tone is ***. ***  fasciculations in *** extremities. *** atrophy. No grip or percussive myotonia.***  Individual muscle group testing (MRC grade out of 5):  Movement     Neck flexion ***    Neck extension ***     Right Left   Shoulder abduction *** ***   Shoulder adduction *** ***   Shoulder ext rotation *** ***   Shoulder int rotation *** ***   Elbow flexion *** ***   Elbow extension *** ***   Wrist extension *** ***   Wrist flexion *** ***   Finger abduction - FDI *** ***   Finger abduction - ADM *** ***   Finger extension *** ***   Finger distal flexion - 2/3 *** ***   Finger distal flexion - 4/5 *** ***   Thumb flexion - FPL *** ***   Thumb abduction - APB *** ***    Hip flexion *** ***   Hip extension *** ***   Hip adduction *** ***   Hip abduction *** ***   Knee extension *** ***   Knee flexion *** ***   Dorsiflexion *** ***   Plantarflexion *** ***   Inversion *** ***   Eversion *** ***   Great toe extension *** ***   Great toe flexion *** ***     Reflexes:  Right Left   Bicep *** ***   Tricep *** ***   BrRad *** ***   Knee *** ***   Ankle *** ***    Pathological Reflexes: Babinski: *** response bilaterally*** Hoffman: *** Troemner: *** Pectoral: *** Palmomental: *** Facial: *** Midline tap: *** Sensation: Pinprick: *** Vibration: *** Temperature: *** Proprioception: *** Coordination: Intact finger-to- nose-finger bilaterally. Romberg negative.*** Gait: Able to rise from chair with arms crossed unassisted. Normal, narrow-based gait. Able to tandem walk. Able to walk on toes and heels.***  Lab and Test Review: Internal labs: 05/25/24: CBC significant for WBC 11.8, Hb 8.4, MCV 89.6 BMP significant for Cr 1.03  05/16/24: Lipid panel: tChol 132, LDL 82, TG 68 08/06/23: TSH wnl  07/12/23: CRP 3.2 ESR 34  10/30/22:  B12: 280 Vit D: 10.0  09/17/22: folate wnl  External labs: ***  Imaging/Procedures: Carotid ultrasound (08/24/22): IMPRESSION: 1.  Moderate-to-large amount of right-sided atherosclerotic plaque, not resulting in a hemodynamically significant stenosis. 2. Minimal amount of left-sided atherosclerotic plaque, not resulting in a hemodynamically significant stenosis.  EEG - 23 minutes (09/17/22): IMPRESSION: This study is within normal limits. No seizures or epileptiform discharges were seen throughout the recording.  MRI brain wo contrast (09/17/22): FINDINGS: Brain: Stable cerebral volume, normal for age. No restricted diffusion to suggest acute infarction. No midline shift, mass effect, evidence of mass lesion, ventriculomegaly, extra-axial collection or acute intracranial hemorrhage. Cervicomedullary junction and pituitary are within normal limits.   Widely scattered small, also Patchy and confluent cerebral white matter  T2 and FLAIR hyperintensity in both hemispheres appears stable since December. No superimposed cortical encephalomalacia or chronic cerebral blood products identified. Small chronic lacunar infarct at the right caudothalamic groove appears stable. Otherwise the deep gray nuclei, brainstem, and cerebellum appear normal for age.   Vascular: Major intracranial vascular flow voids are stable.   Skull and upper cervical spine: Negative for age visible cervical spine. Hyperostosis frontalis, normal variant. Visualized bone marrow signal is within normal limits.   Sinuses/Orbits: Stable, negative.   Other: Mastoids remain well aerated. Grossly normal visible internal auditory structures. Negative visible scalp and face.   IMPRESSION: 1. No acute intracranial abnormality. 2. Stable noncontrast MRI appearance of the brain since December. Moderate for age mostly white matter signal changes, compatible with chronic small vessel disease.  CT head wo contrast (12/07/22): FINDINGS: Brain: Compared to prior exam there is a new, but age indeterminate infarct in the left basal ganglia (series 4, image  33). No evidence of hemorrhage, hydrocephalus, extra-axial collection or mass lesion/mass effect. Sequela of moderate chronic microvascular ischemic change. 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 Mineralization of the basal ganglia bilaterally.   Vascular: No hyperdense vessel or unexpected calcification.   Skull: Normal. Negative for fracture or focal lesion.   Sinuses/Orbits: No middle ear or mastoid effusion. Paranasal sinuses are clear. Bilateral lens replacement. Orbits are otherwise unremarkable.   Other: None.  IMPRESSION: Compared to prior exam there is a new, but age indeterminate infarct in the left basal ganglia. If there is clinical concern for an acute infarct, consider further evaluation with MRI.  CT head wo contrast (03/05/23): FINDINGS: Brain: Normal anatomic configuration. Parenchymal volume loss is commensurate with the patient's age. Stable mild periventricular white matter changes are present likely reflecting the sequela of small vessel ischemia. Remote lacunar infarct noted within the left basal ganglia, unchanged. No abnormal intra or extra-axial mass lesion or fluid collection. No abnormal mass effect or midline shift. No evidence of acute intracranial hemorrhage or infarct. Ventricular size is normal. Cerebellum unremarkable.   Vascular: No asymmetric hyperdense vasculature at the skull base.   Skull: Intact   Sinuses/Orbits: Paranasal sinuses are clear. Orbits are unremarkable.   Other: Mastoid air cells and middle ear cavities are clear.   IMPRESSION: 1. No acute intracranial hemorrhage or infarct. No calvarial fracture. 2. Stable senescent change. 3. Remote lacunar infarct of the left basal ganglia.  Lumbar spine xray (10/10/23): FINDINGS: There is no evidence of lumbar spine fracture. Alignment is normal. Moderate facet joint sclerosis noted in the mid to lower lumbar spine. Mild anterior spurring noted in the lower thoracic  spine. Mild narrow intervertebral space at L5-S1.   IMPRESSION: Degenerative joint changes of lumbar spine.  Peripheral vascular cath (05/15/24): Impression: Successful recanalization of the right superficial femoral artery stents with outflow percutaneous mechanical thrombectomy and restenting of the SFA. Patient has been maximally revascularized. Palpable pulse in the foot at case completion.   ASSESSMENT: [name] is a 83 y.o. female who presents for evaluation of ***. *** has a relevant medical history of ***. *** neurological examination is pertinent for ***. Available diagnostic data is significant for ***. This constellation of symptoms and objective data would most likely localize to ***. ***  PLAN: -Blood work: *** ***  -Return to clinic ***  The impression above as well as the plan as outlined below were extensively discussed with the patient (in the company of ***) who voiced understanding. All questions were answered to their satisfaction.  The patient was counseled on pertinent fall precautions per the printed material provided today, and as noted under the Patient Instructions section below.***  When available, results of the above investigations and possible further recommendations will be communicated to the patient via telephone/MyChart. Patient to call office if not contacted after expected testing turnaround time.   Total time spent reviewing records, interview, history/exam, documentation, and coordination of care on day of encounter:  *** min   Thank you for allowing me to participate in patient's care.  If I can answer any additional questions, I would be pleased to do so.  Venetia Potters, MD   CC: Zollie Lowers, MD [address] [place] [address]  CC: Referring provider: Zollie Lowers, MD [address] [place],  [place] [address]     [1]  Allergies Allergen Reactions   Penicillins Anaphylaxis, Swelling, Rash and Other (See Comments)     Immediate rash, facial/tongue/throat swelling, SOB or lightheadedness with hypotension  [2]  Social History Tobacco Use   Smoking status: Former    Current packs/day: 0.00    Average packs/day: 0.5 packs/day for 61.8 years (30.9 ttl pk-yrs)    Types: Cigarettes    Start date: 4    Quit date: 03/24/2022    Years since quitting: 2.1    Passive exposure: Past   Smokeless tobacco: Never  Vaping Use   Vaping status: Never Used  Substance Use Topics   Alcohol use: Not Currently   Drug use: No   "

## 2024-05-25 NOTE — TOC Progression Note (Signed)
 Transition of Care [facility]) - Progression Note    Patient Details  Name: [name] MRN: [id number] Date of Birth: 06/16/1940  [facility] CM/SW Contact  [name], [place] Phone Number: 05/25/2024, 4:21 PM  Clinical Narrative:     CSW spoke with patient's sig/other- he was interested in [facility] rehab because it was closest to their home. [facility] did not have any beds, CSW will follow up.   Montie Louder, MSW, LCSW Clinical Social Worker    Expected Discharge Plan: Skilled Nursing Facility Barriers to Discharge: Continued Medical Work up               Expected Discharge Plan and Services In-house Referral: Clinical Social Work     Living arrangements for the past 2 months: Single Family Home                                       Social Drivers of Health (SDOH) Interventions SDOH Screenings   Food Insecurity: Patient Unable To Answer (05/16/2024)  Housing: Patient Unable To Answer (05/16/2024)  Transportation Needs: Patient Unable To Answer (05/16/2024)  Recent Concern: Transportation Needs - Unmet Transportation Needs (04/13/2024)  Utilities: Not At Risk (04/20/2024)  Alcohol Screen: Low Risk (11/14/2023)  Depression (PHQ2-9): High Risk (04/21/2024)  Financial Resource Strain: Medium Risk (03/20/2024)  Physical Activity: Insufficiently Active (11/14/2023)  Social Connections: Unknown (05/16/2024)  Stress: Stress Concern Present (11/14/2023)  Tobacco Use: Medium Risk (05/18/2024)  Health Literacy: Adequate Health Literacy (11/14/2023)    Readmission Risk Interventions    05/14/2024   11:16 AM 07/15/2023   12:11 PM  Readmission Risk Prevention Plan  Post Dischage Appt  Complete  Medication Screening  Complete  Transportation Screening Complete Complete  PCP or Specialist Appt within 3-5 Days Complete   HRI or Home Care Consult Complete   Social Work Consult for Recovery Care Planning/Counseling Complete   Palliative Care Screening  Not Applicable   Medication Review Oceanographer) Referral to Pharmacy

## 2024-05-26 ENCOUNTER — Other Ambulatory Visit (HOSPITAL_COMMUNITY): Payer: Self-pay

## 2024-05-26 DIAGNOSIS — T8140XA Infection following a procedure, unspecified, initial encounter: Secondary | ICD-10-CM | POA: Diagnosis not present

## 2024-05-26 LAB — CBC
HCT: 28.6 % — ABNORMAL LOW (ref 36.0–46.0)
Hemoglobin: 8.8 g/dL — ABNORMAL LOW (ref 12.0–15.0)
MCH: 28.7 pg (ref 26.0–34.0)
MCHC: 30.8 g/dL (ref 30.0–36.0)
MCV: 93.2 fL (ref 80.0–100.0)
Platelets: 316 K/uL (ref 150–400)
RBC: 3.07 MIL/uL — ABNORMAL LOW (ref 3.87–5.11)
RDW: 15.4 % (ref 11.5–15.5)
WBC: 12.6 K/uL — ABNORMAL HIGH (ref 4.0–10.5)
nRBC: 0 % (ref 0.0–0.2)

## 2024-05-26 LAB — GLUCOSE, CAPILLARY
Glucose-Capillary: 90 mg/dL (ref 70–99)
Glucose-Capillary: 90 mg/dL (ref 70–99)
Glucose-Capillary: 142 mg/dL — ABNORMAL HIGH (ref 70–99)
Glucose-Capillary: 123 mg/dL — ABNORMAL HIGH (ref 70–99)

## 2024-05-26 NOTE — Care Management Important Message (Signed)
 Important Message  Patient Details  Name: [name] MRN: [id number] Date of Birth: 08-06-1940   Important Message Given:  Yes - Medicare IM     Vonzell Arrie Sharps 05/26/2024, 11:08 AM

## 2024-05-26 NOTE — TOC Progression Note (Signed)
 Transition of Care [facility]) - Progression Note    Patient Details  Name: [name] MRN: [id number] Date of Birth: 1941-05-05  Transition of Care [facility]) CM/SW Contact  [name], [place] Phone Number: 05/26/2024, 4:25 PM  Clinical Narrative:     CSW met with patient at bedside. CSW informed of rehab bed offers, she accepted bed offer w/ [facility].   Patient expressed concerns for her sig/other and his well being while she is out of the home but acknowledges she must take care of herself.   TOC will continue to follow and assist with discharge planning.   Montie Louder, MSW, LCSW Clinical Social Worker    Expected Discharge Plan: Skilled Nursing Facility Barriers to Discharge: Continued Medical Work up               Expected Discharge Plan and Services In-house Referral: Clinical Social Work     Living arrangements for the past 2 months: Single Family Home                                       Social Drivers of Health (SDOH) Interventions SDOH Screenings   Food Insecurity: Patient Unable To Answer (05/16/2024)  Housing: Patient Unable To Answer (05/16/2024)  Transportation Needs: Patient Unable To Answer (05/16/2024)  Recent Concern: Transportation Needs - Unmet Transportation Needs (04/13/2024)  Utilities: Not At Risk (04/20/2024)  Alcohol Screen: Low Risk (11/14/2023)  Depression (PHQ2-9): High Risk (04/21/2024)  Financial Resource Strain: Medium Risk (03/20/2024)  Physical Activity: Insufficiently Active (11/14/2023)  Social Connections: Unknown (05/16/2024)  Stress: Stress Concern Present (11/14/2023)  Tobacco Use: Medium Risk (05/18/2024)  Health Literacy: Adequate Health Literacy (11/14/2023)    Readmission Risk Interventions    05/14/2024   11:16 AM 07/15/2023   12:11 PM  Readmission Risk Prevention Plan  Post Dischage Appt  Complete  Medication Screening  Complete  Transportation Screening Complete Complete  PCP or Specialist  Appt within 3-5 Days Complete   HRI or Home Care Consult Complete   Social Work Consult for Recovery Care Planning/Counseling Complete   Palliative Care Screening Not Applicable   Medication Review Oceanographer) Referral to Pharmacy

## 2024-05-26 NOTE — Progress Notes (Signed)
 Chaplain responded to [place] consult for prayer. Chaplain introduced spiritual care and offered support. [name] shared that she was visiting her friend who was ill and woke to find herself in the hospital and one of her limbs amputated. She endorsees feelings of distress related to this series of events, but also reports trusting God and seeing evidence of God's presence throughout her ordeal including a recent incident where the friend she had been trying to contact after changing phones ended up being an aid who came to assist her nurse in moving her. Chaplain utlized reflective listening to identify challenges as well as sources of hope and coping strategies. Pt requested prayer which I provided in her tradition.  Alan HERO. Davee Lomax, M.Div. [facility] Chaplain Pager [phone] Office [phone]

## 2024-05-26 NOTE — Progress Notes (Signed)
 Occupational Therapy Treatment Patient Details Name: [name] MRN: [id number] DOB: [birth year] Today's Date: 05/26/2024   History of present illness 83 yo F adm 05/13/24 with CP and foot pain. 12/19 RLE angiogram,thrombectomy of superficial femoral artery, popliteal artery, tibioperoneal trunk and peroneal artery. 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 S/p aortogram with stent placement of R superficial femoral artery and thrombectomy of R superficial femoral-popliteal artery 11/18,COPD, HTN, CVA, renal artery stenosis, CHF, NSTEMI, tobacco use, and gout.   OT comments  Pt. Seen for skilled OT treatment session.  Pt. C/o significant pain in RLE declines movement of the RLE at this time. Pt. Able to describe the pains and sensations.  Reviewed with pt. 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  Handout provided and reviewed with pt. How to Administer  the techniques to ease the pain/sensations.  Pt. With good return demo.  Reviewed need to cont. These throughout the day.  Pt. Able to reposition herself with heavy reliance of bed rails but no physical assistance.    Pt. Tearful asking can I just tell you what I'm going through.  Allowed pt. To talk and express her feelings following the recent amputation, her concerns with long term outcomes, care for her significant other and animals during her anticipated cont. Therapy needs, along with bill management while she was away.  I provided information for amputee support group.  Pt. Also agreeable and interested in continued talks about her feelings with a chaplain.  (Rn notified for spiritual consult to be placed).  I also reached out to SW to pass along pts. Concerns for any additional support or assistance that could be provided for pt. And her significant other during this time.        If plan is discharge home, recommend the following:  Two people to help with  walking and/or transfers;Two people to help with bathing/dressing/bathroom   Equipment Recommendations       Recommendations for Other Services      Precautions / Restrictions Precautions Precautions: Fall Restrictions RLE Weight Bearing Per Provider Order: Non weight bearing       Mobility Bed Mobility               General bed mobility comments: pt. able to repositon trunk with us  of RUE and bed rail to center herself    Transfers                         Balance                                           ADL either performed or assessed with clinical judgement   ADL Overall ADL's : Needs assistance/impaired                                       General ADL Comments: tx session focused on education and demo of tech. for management of phantom pains.  also repositioning of LE and in bed for relief    Extremity/Trunk Assessment     Lower Extremity Assessment RLE Deficits / Details: c/o pain, feels like my leg is still there, feels like im moving it when im not.  education on phantom pains and how to manage. handout provided and reviewed with pt. pt. able to demonstrate the techniques        Vision       Perception     Praxis     Communication Communication Communication: Impaired Factors Affecting Communication: Reduced clarity of speech;Difficulty expressing self   Cognition Arousal: Alert Behavior During Therapy: WFL for tasks assessed/performed                                 Following commands: Impaired Following commands impaired: Follows one step commands with increased time      Cueing   Cueing Techniques: Verbal cues, Gestural cues, Tactile cues, Visual cues  Exercises      Shoulder Instructions       General Comments      Pertinent Vitals/ Pain       Pain Assessment Pain Assessment: Faces Faces Pain Scale: Hurts even more Pain Location: RLE, phantom pains and sx.  pains Pain Descriptors / Indicators: Aching Pain Intervention(s): Limited activity within patient's tolerance, Repositioned, Monitored during session  Home Living                                          Prior Functioning/Environment              Frequency  Min 2X/week        Progress Toward Goals  OT Goals(current goals can now be found in the care plan section)  Progress towards OT goals: Progressing toward goals     Plan      Co-evaluation                 AM-PAC OT 6 Clicks Daily Activity     Outcome Measure   Help from another person eating meals?: A Lot Help from another person taking care of personal grooming?: A Little Help from another person toileting, which includes using toliet, bedpan, or urinal?: Total Help from another person bathing (including washing, rinsing, drying)?: A Lot Help from another person to put on and taking off regular upper body clothing?: A Lot Help from another person to put on and taking off regular lower body clothing?: Total 6 Click Score: 11    End of Session    OT Visit Diagnosis: Unsteadiness on feet (R26.81);Other abnormalities of gait and mobility (R26.89);Muscle weakness (generalized) (M62.81);Repeated falls (R29.6);Pain;Cognitive communication deficit (R41.841);Dizziness and giddiness (R42) Pain - Right/Left: Right Pain - part of body: Leg   Activity Tolerance Patient limited by pain   Patient Left in bed;with call bell/phone within reach   Nurse Communication Other (comment) (rn notified of pts. request for visit with chaplain. also reached out to sw secondary pts. other reports concerns/needs)        Time: 8856-8784 OT Time Calculation (min): 32 min  Charges: OT General Charges $OT Visit: 1 Visit OT Treatments $Self Care/Home Management : 23-37 mins  Randall, COTA/L Acute Rehabilitation [phone]   [name]-COTA/L  05/26/2024, 1:21 PM

## 2024-05-26 NOTE — Progress Notes (Signed)
 [facility] Progress Note  Patient: [name]    FMW:[id number]  DOA: 05/13/2024     Date of Service: the patient was seen and examined on 05/26/2024  Chief Complaint  Patient presents with   Foot Pain   Chest Pain   Brief hospital course:  Medical records reviewed and are as summarized below:   KATLEEN CARRAWAY is a 83 y.o. female  with a history of hypertension, hyperlipidemia, CAD, CKD stage IIIa, HFpEF, stroke, COPD, renal artery stenosis, PAD, GERD, restless leg syndrome. Patient presented secondary to foot and chest pain with evidence of critical limb ischemia involving her right lower extremity. Vascular surgery consulted. 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   Assessment and Plan:   Acute limb ischemia of right foot Peripheral vascular disease Vascular surgery consulted. Patient underwent thrombolysis of the right lower extremity 12/18-12/19. Patient with obvious ischemic disease. 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  -Vascular surgery recommendations noted -Continue aspirin  and Plavix . - S/p treatment of IV heparin  drip.     Anemia of chronic kidney disease Acute blood loss anemia Hemoglobin up from 6.7-7.7. S/p transfusion with 1 unit of PRBCs on 05/22/2024 Hemoglobin was 11.4 on admission. 12/28 Hb 6.3>> 1 unit of PRBC> Hb 8.5   Hypophosphatemia: Improved.     Acute thrombocytopenia: Resolved     Acute metabolic encephalopathy Cognitive impairment at baseline.  Mental status improved.   Presumed secondary to infection. Has had intermittent confusion. Delirium precautions     AKI on CKD stage IIIa Baseline creatinine of around 1.1-1.4. Creatinine of 2.65 on admission. AKI improved and renal function is back to baseline.     Chronic HFpEF Noted and stable. Last LVEF of 55-60% with grade 1 diastolic dysjunction in June 2025.       Comorbidities include nonobstructive history  of stroke and CAD on aspirin  and Plavix , COPD, hyperlipidemia, hypertension   Body mass index is 20.84 kg/m.  Interventions:   Diet: Regular diet DVT Prophylaxis: SCDs, already on DAPT  Advance goals of care discussion: Full code  Family Communication: family was not present at bedside, at the time of interview.  The pt provided permission to discuss medical plan with the family. Opportunity was given to ask question and all questions were answered satisfactorily.   Disposition:  Pt is from home, admitted with RLE critical limb ischemia s/p AKA, developed acute blood loss anemia, s/p PRBC transfusion Discharge to SNF, when bed will be available. Stable to discharge. Follow TOC for SNF placement   Subjective: No significant events overnight.  C/o pain 6/10, off-and-on pain, no any other active issues.   Physical Exam: General: NAD, lying comfortably Appear in no distress, affect appropriate Eyes: PERRLA ENT: Oral Mucosa Clear, moist  Neck: no JVD,  Cardiovascular: S1 and S2 Present, no Murmur,  Respiratory: good respiratory effort, Bilateral Air entry equal and Decreased, no Crackles, no wheezes Abdomen: Bowel Sound present, Soft and no tenderness,  Skin: no rashes Extremities: s/p Right AKA, dressing CDI Neurologic: without any new focal findings Gait not checked due to patient safety concerns  Vitals:   05/26/24 0419 05/26/24 0836 05/26/24 1143 05/26/24 1320  BP: 137/68 (!) 113/56 103/69   Pulse:  80 89   Resp: 15 12 19 20   Temp: 98.2 F (36.8 C) 98.9 F (37.2 C) 98.3 F (36.8 C)   TempSrc: Oral Oral Oral   SpO2:  97%    Weight:  Height:        Intake/Output Summary (Last 24 hours) at 05/26/2024 1556 Last data filed at 05/26/2024 0906 Gross per 24 hour  Intake --  Output 200 ml  Net -200 ml   Filed Weights   05/13/24 1500 05/20/24 0700  Weight: 50 kg 50 kg    Data Reviewed: I have personally reviewed and interpreted daily labs, tele strips,  imagings as discussed above. I reviewed all nursing notes, pharmacy notes, vitals, pertinent old records I have discussed plan of care as described above with RN and patient/family.  CBC: Recent Labs  Lab 05/21/24 0339 05/21/24 0856 05/22/24 0423 05/23/24 0510 05/24/24 0332 05/24/24 1508 05/25/24 0406 05/26/24 0406  WBC 8.3  --  8.8 8.1 9.7  --  11.8* 12.6*  NEUTROABS 5.3  --   --   --   --   --   --   --   HGB 6.8*   < > 6.7* 7.7* 6.3* 8.5* 8.4* 8.8*  HCT 21.8*   < > 21.3* 23.9* 19.9* 26.5* 25.9* 28.6*  MCV 92.8  --  90.6 93.7 92.1  --  89.6 93.2  PLT 169  --  236 273 297  --  298 316   < > = values in this interval not displayed.   Basic Metabolic Panel: Recent Labs  Lab 05/20/24 0344 05/21/24 0339 05/23/24 0510 05/25/24 0406  NA 141 141 140 140  K 4.6 4.8 4.3 4.6  CL 112* 110 108 107  CO2 24 26 26 28   GLUCOSE 96 99 83 96  BUN 19 18 15 22   CREATININE 0.92 0.91 0.90 1.03*  CALCIUM  7.8* 7.8* 7.7* 7.6*  MG  --  2.0  --  2.1  PHOS  --  1.7* 2.8 2.5    Studies: No results found.  Scheduled Meds:  sodium chloride    Intravenous Once   aspirin  EC  81 mg Oral Q breakfast   atorvastatin   80 mg Oral Daily   clopidogrel   75 mg Oral Daily   ezetimibe   10 mg Oral Daily   feeding supplement  237 mL Oral BID BM   fluticasone  furoate-vilanterol  1 puff Inhalation Daily   pantoprazole   40 mg Oral Daily   pregabalin   300 mg Oral QHS   rOPINIRole   1 mg Oral QHS   sodium chloride  flush  3 mL Intravenous Q12H   Continuous Infusions: PRN Meds: acetaminophen  **OR** acetaminophen , acetaminophen , albuterol , bisacodyl , hydrALAZINE , HYDROmorphone  (DILAUDID ) injection, labetalol , morphine  injection, naLOXone  (NARCAN )  injection, ondansetron  **OR** ondansetron  (ZOFRAN ) IV, oxyCODONE , polyethylene glycol, traZODone   Time spent: 35 minutes  Author: ELVAN SOR. MD [facility] 05/26/2024 3:56 PM  To reach On-call, see care teams to locate the attending and reach out to them  via [email or web address]. If 7PM-7AM, please contact night-coverage If you still have difficulty reaching the attending provider, please page the [facility] (Director on Call) for [facility] on amion for assistance.

## 2024-05-26 NOTE — Plan of Care (Signed)
" °  Problem: Clinical Measurements: Goal: Will remain free from infection Outcome: Progressing   Problem: Nutrition: Goal: Adequate nutrition will be maintained Outcome: Progressing   Problem: Activity: Goal: Risk for activity intolerance will decrease Outcome: Progressing   Problem: Pain Managment: Goal: General experience of comfort will improve and/or be controlled Outcome: Progressing   Problem: Safety: Goal: Ability to remain free from injury will improve Outcome: Progressing   Problem: Skin Integrity: Goal: Risk for impaired skin integrity will decrease Outcome: Progressing   "

## 2024-05-26 NOTE — Plan of Care (Signed)
  Problem: Clinical Measurements: Goal: Will remain free from infection Outcome: Progressing Goal: Respiratory complications will improve Outcome: Progressing Goal: Cardiovascular complication will be avoided Outcome: Progressing   Problem: Activity: Goal: Risk for activity intolerance will decrease Outcome: Progressing   Problem: Nutrition: Goal: Adequate nutrition will be maintained Outcome: Progressing   Problem: Coping: Goal: Level of anxiety will decrease Outcome: Progressing

## 2024-05-26 NOTE — TOC Progression Note (Signed)
 Transition of Care [facility]) - Progression Note    Patient Details  Name: [name] MRN: [id number] Date of Birth: 01/26/1941  Transition of Care [facility]) CM/SW Contact  [name], [place] Phone Number: 05/26/2024, 10:07 AM  Clinical Narrative:     Sent message to [facility] - waiting on determination   Expected Discharge Plan: Skilled Nursing Facility Barriers to Discharge: Continued Medical Work up               Expected Discharge Plan and Services In-house Referral: Clinical Social Work     Living arrangements for the past 2 months: Single Family Home                                       Social Drivers of Health (SDOH) Interventions SDOH Screenings   Food Insecurity: Patient Unable To Answer (05/16/2024)  Housing: Patient Unable To Answer (05/16/2024)  Transportation Needs: Patient Unable To Answer (05/16/2024)  Recent Concern: Transportation Needs - Unmet Transportation Needs (04/13/2024)  Utilities: Not At Risk (04/20/2024)  Alcohol Screen: Low Risk (11/14/2023)  Depression (PHQ2-9): High Risk (04/21/2024)  Financial Resource Strain: Medium Risk (03/20/2024)  Physical Activity: Insufficiently Active (11/14/2023)  Social Connections: Unknown (05/16/2024)  Stress: Stress Concern Present (11/14/2023)  Tobacco Use: Medium Risk (05/18/2024)  Health Literacy: Adequate Health Literacy (11/14/2023)    Readmission Risk Interventions    05/14/2024   11:16 AM 07/15/2023   12:11 PM  Readmission Risk Prevention Plan  Post Dischage Appt  Complete  Medication Screening  Complete  Transportation Screening Complete Complete  PCP or Specialist Appt within 3-5 Days Complete   HRI or Home Care Consult Complete   Social Work Consult for Recovery Care Planning/Counseling Complete   Palliative Care Screening Not Applicable   Medication Review Oceanographer) Referral to Pharmacy

## 2024-05-27 LAB — CBC
HCT: 26.1 % — ABNORMAL LOW (ref 36.0–46.0)
Hemoglobin: 8.1 g/dL — ABNORMAL LOW (ref 12.0–15.0)
MCH: 28.6 pg (ref 26.0–34.0)
MCHC: 31 g/dL (ref 30.0–36.0)
MCV: 92.2 fL (ref 80.0–100.0)
Platelets: 338 K/uL (ref 150–400)
RBC: 2.83 MIL/uL — ABNORMAL LOW (ref 3.87–5.11)
RDW: 14.7 % (ref 11.5–15.5)
WBC: 11.7 K/uL — ABNORMAL HIGH (ref 4.0–10.5)
nRBC: 0 % (ref 0.0–0.2)

## 2024-05-27 LAB — GLUCOSE, CAPILLARY
Glucose-Capillary: 76 mg/dL (ref 70–99)
Glucose-Capillary: 100 mg/dL — ABNORMAL HIGH (ref 70–99)

## 2024-05-27 MED ORDER — EZETIMIBE 10 MG PO TABS: 10.0000 mg | ORAL_TABLET | Freq: Every day | ORAL | Status: AC

## 2024-05-27 MED ORDER — OXYCODONE HCL 5 MG PO TABS: 5.0000 mg | ORAL_TABLET | ORAL | 0 refills | Status: AC | PRN

## 2024-05-27 MED ORDER — PREGABALIN 300 MG PO CAPS: 300.0000 mg | ORAL_CAPSULE | Freq: Every day | ORAL | 0 refills | Status: AC

## 2024-05-27 NOTE — TOC Progression Note (Signed)
 Transition of Care [facility]) - Progression Note    Patient Details  Name: [name] MRN: [id number] Date of Birth: 30-Nov-1940  Transition of Care [facility]) CM/SW Contact  [name], [place] Phone Number: 05/27/2024, 12:18 PM  Clinical Narrative:     Insurance authorization approved [place] [id number] [id number] 05/27/2024-05/29/2024 - SNF updated , waiting on SNF to confirm they can admit today.   Montie Louder, MSW, LCSW Clinical Social Worker    Expected Discharge Plan: Skilled Nursing Facility Barriers to Discharge: Continued Medical Work up               Expected Discharge Plan and Services In-house Referral: Clinical Social Work     Living arrangements for the past 2 months: Single Family Home                                       Social Drivers of Health (SDOH) Interventions SDOH Screenings   Food Insecurity: Patient Unable To Answer (05/16/2024)  Housing: Patient Unable To Answer (05/16/2024)  Transportation Needs: Patient Unable To Answer (05/16/2024)  Recent Concern: Transportation Needs - Unmet Transportation Needs (04/13/2024)  Utilities: Not At Risk (04/20/2024)  Alcohol Screen: Low Risk (11/14/2023)  Depression (PHQ2-9): High Risk (04/21/2024)  Financial Resource Strain: Medium Risk (03/20/2024)  Physical Activity: Insufficiently Active (11/14/2023)  Social Connections: Unknown (05/16/2024)  Stress: Stress Concern Present (11/14/2023)  Tobacco Use: Medium Risk (05/18/2024)  Health Literacy: Adequate Health Literacy (11/14/2023)    Readmission Risk Interventions    05/14/2024   11:16 AM 07/15/2023   12:11 PM  Readmission Risk Prevention Plan  Post Dischage Appt  Complete  Medication Screening  Complete  Transportation Screening Complete Complete  PCP or Specialist Appt within 3-5 Days Complete   HRI or Home Care Consult Complete   Social Work Consult for Recovery Care Planning/Counseling Complete   Palliative Care Screening Not Applicable    Medication Review Oceanographer) Referral to Pharmacy

## 2024-05-27 NOTE — Discharge Summary (Signed)
 "  Physician Discharge Summary  [name] FMW:[id number] DOB: 23-Jan-1941 DOA: 05/13/2024  PCP: Zollie Lowers, MD  Admit date: 05/13/2024 Discharge date: 05/27/2024  Admitted From: home Disposition:  SNF  Recommendations for Outpatient Follow-up:  Follow up with PCP in 1-2 weeks Please obtain BMP/CBC in one week  Home Health: none Equipment/Devices: none  Discharge Condition: stable CODE STATUS: Full code Diet Orders (From admission, onward)     Start     Ordered   05/18/24 1715  Diet regular Room service appropriate? Yes; Fluid consistency: Thin  Diet effective now       Question Answer Comment  Room service appropriate? Yes   Fluid consistency: Thin      05/18/24 1715            HPI:  [name] is a 83 y.o. female  with a history of hypertension, hyperlipidemia, CAD, CKD stage IIIa, HFpEF, stroke, COPD, renal artery stenosis, PAD, GERD, restless leg syndrome. Patient presented secondary to foot and chest pain with evidence of critical limb ischemia involving her right lower extremity. Vascular surgery consulted. 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   Hospital Course / Discharge diagnoses: Principal problem PAD, acute limb ischemia of right foot - Vascular surgery consulted. Patient underwent thrombolysis of the right lower extremity 12/18-12/19. Patient with obvious ischemic disease. 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 Continue aspirin  and Plavix .    Active problems Anemia of chronic kidney disease Acute blood loss anemia - S/p transfusion with 1 unit of PRBCs on 05/22/2024. Hemoglobin has remained stable. She has no bleeding Hypophosphatemia - Improved. Acute thrombocytopenia - Resolved Acute metabolic encephalopathy - Cognitive impairment at baseline.  Mental status improved.  Presumed secondary to infection. Has had intermittent confusion.  AKI on CKD stage IIIa - Baseline creatinine of  around 1.1-1.4. Creatinine of 2.65 on admission. AKI improved and renal function is back to baseline. Chronic HFpEF - Noted and stable. Last LVEF of 55-60% with grade 1 diastolic dysjunction in June 2025. Euvolemic  Sepsis ruled out   Discharge Instructions   Allergies as of 05/27/2024       Reactions   Penicillins Anaphylaxis, Swelling, Rash, Other (See Comments)   Immediate rash, facial/tongue/throat swelling, SOB or lightheadedness with hypotension        Medication List     STOP taking these medications    potassium chloride  SA 20 MEQ tablet Commonly known as: KLOR-CON  M       TAKE these medications    rOPINIRole  1 MG tablet Commonly known as: REQUIP  TAKE 1 TABLET (1 MG TOTAL) BY MOUTH AT BEDTIME. FOR LEG CRAMPS The timing of this medication is very important.   acetaminophen  500 MG tablet Commonly known as: TYLENOL  Take 2 tablets (1,000 mg total) by mouth 3 (three) times daily.   albuterol  108 (90 Base) MCG/ACT inhaler Commonly known as: VENTOLIN  HFA Inhale 2 puffs into the lungs every 4 (four) hours as needed for wheezing or shortness of breath.   alendronate  70 MG tablet Commonly known as: FOSAMAX  Take 1 tablet (70 mg total) by mouth every 7 (seven) days. Take with a full glass of water  on an empty stomach.   amLODipine  10 MG tablet Commonly known as: NORVASC  Take 1 tablet (10 mg total) by mouth daily. To lower blood pressure.   aspirin  EC 81 MG tablet Take 1 tablet (81 mg total) by mouth daily. Swallow whole.   atorvastatin  80 MG tablet Commonly known  as: LIPITOR  Take 1 tablet (80 mg total) by mouth daily. To lower cholesterol and protect leg stents.   clopidogrel  75 MG tablet Commonly known as: PLAVIX  Take 1 tablet (75 mg total) by mouth daily. To protect your leg stents.   ezetimibe  10 MG tablet Commonly known as: ZETIA  Take 1 tablet (10 mg total) by mouth daily. Start taking on: May 28, 2024   fluticasone  furoate-vilanterol 200-25  MCG/ACT Aepb Commonly known as: Breo Ellipta  Inhale 1 puff into the lungs daily.   furosemide  20 MG tablet Commonly known as: LASIX  TAKE 1 TABLET (20 MG TOTAL) BY MOUTH DAILY. FOR SWELLING What changed:  when to take this reasons to take this   isosorbide  mononitrate 60 MG 24 hr tablet Commonly known as: IMDUR  Take 1 tablet (60 mg total) by mouth daily. To lower blood pressure.   oxyCODONE  5 MG immediate release tablet Commonly known as: Oxy IR/ROXICODONE  Take 1 tablet (5 mg total) by mouth every 4 (four) hours as needed for moderate pain (pain score 4-6).   pantoprazole  40 MG tablet Commonly known as: PROTONIX  Take 1 tablet (40 mg total) by mouth daily. What changed:  when to take this reasons to take this   pregabalin  300 MG capsule Commonly known as: Lyrica  Take 1 capsule (300 mg total) by mouth at bedtime. To reduce back pain        Follow-up Information     [facility] Follow up.   Specialty: Vascular Surgery Why: 4-6 weeks for staple remova. The office will call you with your appointment Contact information: [address] [place]  [phone]                Consultations: Vascular surgery   Procedures/Studies:  PERIPHERAL VASCULAR CATHETERIZATION Result Date: 05/18/2024 Images from the original result were not included.   Patient name: [name]        MRN: [id number]        DOB: Jan 21, 1941            Sex: female  05/14/2024 Pre-operative Diagnosis: CLTI with foot wounds Post-operative diagnosis:  Same Surgeon:  Norman GORMAN Serve, MD Procedure Performed:  Ultrasound-guided access of left common femoral artery Aortogram and  right leg angiogram Third order cannulation of right popliteal UniFuse catheter placement into right popliteal and SFA with initiation of lysis  Indications: Ms. Napierkowski is an 83 year old female with CL TI who underwent right SFA  mechanical thrombectomy and stenting of the SFA and popliteal about 1 month ago.  She underwent right fifth toe amputation by Dr. Malvin on 11/26.  She presented to the emergency department yesterday with gangrenous changes of the right foot and there were no pedal pulses noted.  Risks and benefits of repeat angiogram with possible intervention were reviewed.  It was explained that her previous revascularizations likely occluded and that she would possibly need lysis.  Findings: Patent aorta, renal arteries and iliac systems bilaterally Complete occlusion of the right SFA through the popliteal stents.  Reconstitution at the distal edge of the stent with three-vessel runoff             Procedure:  The patient was identified in the holding area and taken to the cath lab  The patient was then placed supine on the table and prepped and draped in the usual sterile fashion.  A time out was called.  Ultrasound was  used to evaluate the left common femoral artery.  It was patent .  A digital ultrasound image was acquired.  A micropuncture needle was used to access the left common femoral artery under ultrasound guidance.  An 018 wire was advanced without resistance and a micropuncture sheath was placed.  The 018 wire was removed and a benson wire was placed.  The micropuncture sheath was exchanged for a 5 french sheath.  An omniflush catheter was advanced over the wire to the level of L-1.  An abdominal angiogram was obtained.  Next, using the omniflush catheter and a glide advantage wire, the aortic bifurcation was crossed and the catheter was placed into theleft external iliac artery and left runoff was obtained. This demonstrated the above findings.  The glide advantage wire was then replaced with the catheter and into the SFA.  The short 5 French sheath was exchanged for a 6 French by 45 cm catapult sheath and the patient was given an additional 3000 unit bolus of heparin .  The wire was easily passed down through the  SFA and popliteal stents.  A quick cross catheter was placed into the below-knee popliteal and an angiogram demonstrated access into the true lumen distally.  The wire was replaced through the catheter and into the popliteal.  Over this wire a 50 cm UniFuse catheter was placed and lysis was initiated at 1 mg/h.  Impression: Lysis initiation, plan for takeback tomorrow   Norman GORMAN Serve MD [facility] Office: [phone]   PERIPHERAL VASCULAR CATHETERIZATION Result Date: 05/15/2024 Patient name: ZYAIRE DUMAS MRN: [id number] DOB: [birth year] Sex: female 05/15/2024 Pre-operative Diagnosis: Right lower extremity critical ischemia tissue loss and occluded stents currently undergoing catheter directed thrombolysis Post-operative diagnosis:  Same Surgeon:  Fonda FORBES Rim, MD Procedure Performed: 1.  Right lower extremity angiogram 2.  Percutaneous mechanical thrombectomy CAT 6 penumbra device superficial femoral artery, popliteal artery, tibioperoneal trunk, peroneal artery 3.  Viabahn stenting 6 x 25, 6 x 15, 6 x 5 superficial femoral artery, popliteal artery 4.  Balloon angioplasty tibioperoneal trunk, anterior tibial artery, posterior tibial artery 5.  Access manage using ProGlide device without issue 6.  No sedation medication administered Indications: Patient is 83 year old female with previous history of right lower extremity stenting.  The stents occluded leading to lower extremity ischemia.  She is currently undergoing lysis. Findings: Superficial femoral artery with proximal disease, partially occlusive thrombus. Partially occlusive thrombus appreciated throughout the superficial femoral artery, popliteal artery.  Distally, the popliteal artery is patent, with occlusion of the anterior tibial artery, tibioperoneal trunk, peroneal artery, posterior tibial artery.  Procedure:  The patient was identified in the holding area and taken to room 8.  The patient was then placed supine  on the table and prepped and draped in the usual sterile fashion.  A time out was called.  Case began with removal of the lytic catheter over a 0.018 wire and diagnostic right lower extremity angiogram followed.  See results above.  I elected to attempt intervention.  The CAT 6 penumbra device was brought onto the field and run from the superficial femoral artery through the popliteal artery, tibioperoneal trunk, into the peroneal artery.  Follow-up angiography demonstrated significant improvement.  There is continued to be nonocclusive thrombus in the SFA as well as stenosis at the proximal anterior tibial artery and proximal posterior tibial artery.  I elected to restent the superficial femoral artery using a Viabahn to trap the thrombus.  Viabahn stents were  placed from P2 popliteal artery to the ostia of the superficial femoral artery.  6 x 25, 6 x 15, 6.5 superficial femoral artery, popliteal artery Viabahn stents. Follow-up angiography demonstrated excellent result.  Distally, there continued to be stenosis at the anterior tibial artery and posterior tibial arteries.  There was also a significant mount of vasospasm.  400 mcg of nitroglycerin  were administered intra-arterially and 200 mcg intervals.  This demonstrated significant improvement.  Next, I took a 3 mm balloon and balloon the anterior tibial artery, as well as the posterior tibial artery with follow-up angiography demonstrating excellent result with midline flow to the foot. The patient has been maximally vascularized.  Closed using a ProGlide device without issue. Impression: Successful recanalization of the right superficial femoral artery stents with outflow percutaneous mechanical thrombectomy and restenting of the SFA.  Patient has been maximally revascularized.  Palpable pulse in the foot at case completion. Fonda FORBES Rim MD [facility] Office: [phone]   DG Chest Port 1 View Result Date:  05/13/2024 EXAM: 1 VIEW(S) XRAY OF THE CHEST 05/13/2024 04:28:00 AM COMPARISON: 04/13/2024 CLINICAL HISTORY: Questionable sepsis - evaluate for abnormality FINDINGS: LUNGS AND PLEURA: Hyperinflation. No focal pulmonary opacity. No pleural effusion. No pneumothorax. HEART AND MEDIASTINUM: Aortic calcification. No acute abnormality of the cardiac and mediastinal silhouettes. BONES AND SOFT TISSUES: Cervical spine fusion hardware noted. No acute osseous abnormality. IMPRESSION: 1. No acute findings. 2. Hyperinflation. Electronically signed by: Evalene Coho MD 05/13/2024 05:17 AM EST RP Workstation: [id number]   DG Foot Complete Right Result Date: 05/13/2024 EXAM: 3 OR MORE VIEW(S) XRAY OF THE RIGHT FOOT 05/13/2024 04:25:00 AM COMPARISON: Right foot series dated 04/22/2024. CLINICAL HISTORY: Questionable sepsis - evaluate for abnormality. FINDINGS: BONES AND JOINTS: Status post amputation of the fifth toe at the level of the metatarsophalangeal joint. Mild degenerative spurring is again noted within the midfoot. There is moderate spurring involving the calcaneus at the insertion site of the Achilles tendon and the plantar aponeurosis. There are no findings suspicious for active osteomyelitis. No acute fracture. No malalignment. SOFT TISSUES: The soft tissues are unremarkable. IMPRESSION: 1. No acute findings. 2. Status post amputation of the fifth toe at the level of the metatarsophalangeal joint. 3. Mild degenerative spurring within the midfoot. 4. Moderate spurring involving the calcaneus at the insertion site of the Achilles tendon and the plantar aponeurosis. Electronically signed by: Evalene Coho MD 05/13/2024 05:17 AM EST RP Workstation: [id number]     Subjective: - no chest pain, shortness of breath, no abdominal pain, nausea or vomiting.   Discharge Exam: BP (!) 125/53 (BP Location: Left Arm)   Pulse 78   Temp 98.6 F (37 C) (Oral)   Resp 18   Ht 5' 0.98 (1.549 m)   Wt 50 kg   SpO2  98%   BMI 20.84 kg/m   General: Pt is alert, awake, not in acute distress Cardiovascular: RRR, S1/S2 +, no rubs, no gallops Respiratory: CTA bilaterally, no wheezing, no rhonchi Abdominal: Soft, NT, ND, bowel sounds + Extremities: no edema, no cyanosis    The results of significant diagnostics from this hospitalization (including imaging, microbiology, ancillary and laboratory) are listed below for reference.     Microbiology: No results found for this or any previous visit (from the past 240 hours).   Labs: Basic Metabolic Panel: Recent Labs  Lab 05/21/24 0339 05/23/24 0510 05/25/24 0406  NA 141 140 140  K 4.8 4.3 4.6  CL 110 108 107  CO2  26 26 28   GLUCOSE 99 83 96  BUN 18 15 22   CREATININE 0.91 0.90 1.03*  CALCIUM  7.8* 7.7* 7.6*  MG 2.0  --  2.1  PHOS 1.7* 2.8 2.5   Liver Function Tests: Recent Labs  Lab 05/21/24 0339 05/23/24 0510  AST 15  --   ALT <5  --   ALKPHOS 36*  --   BILITOT <0.2  --   PROT 4.6*  --   ALBUMIN 2.1* 2.0*   CBC: Recent Labs  Lab 05/21/24 0339 05/21/24 0856 05/23/24 0510 05/24/24 0332 05/24/24 1508 05/25/24 0406 05/26/24 0406 05/27/24 0458  WBC 8.3   < > 8.1 9.7  --  11.8* 12.6* 11.7*  NEUTROABS 5.3  --   --   --   --   --   --   --   HGB 6.8*   < > 7.7* 6.3* 8.5* 8.4* 8.8* 8.1*  HCT 21.8*   < > 23.9* 19.9* 26.5* 25.9* 28.6* 26.1*  MCV 92.8   < > 93.7 92.1  --  89.6 93.2 92.2  PLT 169   < > 273 297  --  298 316 338   < > = values in this interval not displayed.   CBG: Recent Labs  Lab 05/26/24 1209 05/26/24 1606 05/26/24 2029 05/27/24 0755 05/27/24 1158  GLUCAP 90 142* 123* 76 100*   Hgb A1c No results for input(s): HGBA1C in the last 72 hours. Lipid Profile No results for input(s): CHOL, HDL, LDLCALC, TRIG, CHOLHDL, LDLDIRECT in the last 72 hours. Thyroid  function studies No results for input(s): TSH, T4TOTAL, T3FREE, THYROIDAB in the last 72 hours.  Invalid input(s):  FREET3 Urinalysis    Component Value Date/Time   COLORURINE YELLOW 03/08/2024 1630   APPEARANCEUR CLEAR 03/08/2024 1630   LABSPEC >1.046 (H) 03/08/2024 1630   PHURINE 5.0 03/08/2024 1630   GLUCOSEU NEGATIVE 03/08/2024 1630   HGBUR SMALL (A) 03/08/2024 1630   BILIRUBINUR NEGATIVE 03/08/2024 1630   KETONESUR NEGATIVE 03/08/2024 1630   PROTEINUR 100 (A) 03/08/2024 1630   UROBILINOGEN 0.2 02/11/2012 1600   NITRITE NEGATIVE 03/08/2024 1630   LEUKOCYTESUR NEGATIVE 03/08/2024 1630    FURTHER DISCHARGE INSTRUCTIONS:   Get Medicines reviewed and adjusted: Please take all your medications with you for your next visit with your Primary MD   Laboratory/radiological data: Please request your Primary MD to go over all hospital tests and procedure/radiological results at the follow up, please ask your Primary MD to get all Hospital records sent to his/her office.   In some cases, they will be blood work, cultures and biopsy results pending at the time of your discharge. Please request that your primary care M.D. goes through all the records of your hospital data and follows up on these results.   Also Note the following: If you experience worsening of your admission symptoms, develop shortness of breath, life threatening emergency, suicidal or homicidal thoughts you must seek medical attention immediately by calling 911 or calling your MD immediately  if symptoms less severe.   You must read complete instructions/literature along with all the possible adverse reactions/side effects for all the Medicines you take and that have been prescribed to you. Take any new Medicines after you have completely understood and accpet all the possible adverse reactions/side effects.    Do not drive when taking Pain medications or sleeping medications (Benzodaizepines)   Do not take more than prescribed Pain, Sleep and Anxiety Medications. It is not advisable to combine anxiety,sleep and  pain medications  without talking with your primary care practitioner   Special Instructions: If you have smoked or chewed Tobacco  in the last 2 yrs please stop smoking, stop any regular Alcohol  and or any Recreational drug use.   Wear Seat belts while driving.   Please note: You were cared for by a hospitalist during your hospital stay. Once you are discharged, your primary care physician will handle any further medical issues. Please note that NO REFILLS for any discharge medications will be authorized once you are discharged, as it is imperative that you return to your primary care physician (or establish a relationship with a primary care physician if you do not have one) for your post hospital discharge needs so that they can reassess your need for medications and monitor your lab values.  Time coordinating discharge: 35 minutes  SIGNED:  Nilda Fendt, MD, PhD 05/27/2024, 12:52 PM   "

## 2024-05-27 NOTE — Progress Notes (Signed)
 AVS and scrips in discharge packet

## 2024-05-27 NOTE — TOC Transition Note (Signed)
 Transition of Care [facility]) - Discharge Note   Patient Details  Name: [name] MRN: [id number] Date of Birth: November 28, 1940  [facility] CM/SW Contact:  [name], LCSW Phone Number: 05/27/2024, 12:30 PM   Clinical Narrative:    Patient will Discharge to: [facility] Discharge Date: 05/27/2024 Family Notified: [name] Transport By: ROME  Per MD patient is ready for discharge. RN, patient, and facility notified of discharge. Discharge Summary sent to facility. RN given number for [phone]. Ambulance transport requested for patient.   Clinical Social Worker signing off.  Montie Louder, MSW, LCSW Clinical Social Worker      Final next level of care: Skilled Nursing Facility Barriers to Discharge: Barriers Resolved   Patient Goals and CMS Choice            Discharge Placement              Patient chooses bed at: [facility] Patient to be transferred to facility by: [facility] Name of family member notified: sig/other Patient and family notified of of transfer: 05/27/24  Discharge Plan and Services Additional resources added to the After Visit Summary for   In-house Referral: Clinical Social Work                                   Social Drivers of Health (SDOH) Interventions SDOH Screenings   Food Insecurity: Patient Unable To Answer (05/16/2024)  Housing: Patient Unable To Answer (05/16/2024)  Transportation Needs: Patient Unable To Answer (05/16/2024)  Recent Concern: Transportation Needs - Unmet Transportation Needs (04/13/2024)  Utilities: Not At Risk (04/20/2024)  Alcohol Screen: Low Risk (11/14/2023)  Depression (PHQ2-9): High Risk (04/21/2024)  Financial Resource Strain: Medium Risk (03/20/2024)  Physical Activity: Insufficiently Active (11/14/2023)  Social Connections: Unknown (05/16/2024)  Stress: Stress Concern Present (11/14/2023)  Tobacco Use: Medium Risk (05/18/2024)  Health Literacy: Adequate Health  Literacy (11/14/2023)     Readmission Risk Interventions    05/14/2024   11:16 AM 07/15/2023   12:11 PM  Readmission Risk Prevention Plan  Post Dischage Appt  Complete  Medication Screening  Complete  Transportation Screening Complete Complete  PCP or Specialist Appt within 3-5 Days Complete   HRI or Home Care Consult Complete   Social Work Consult for Recovery Care Planning/Counseling Complete   Palliative Care Screening Not Applicable   Medication Review Oceanographer) Referral to Pharmacy

## 2024-06-02 ENCOUNTER — Ambulatory Visit: Admitting: Vascular Surgery

## 2024-06-03 LAB — LAB REPORT - SCANNED
A1c: 5.4
EGFR: 52

## 2024-06-05 ENCOUNTER — Ambulatory Visit: Admitting: Neurology

## 2024-06-05 ENCOUNTER — Encounter: Payer: Self-pay | Admitting: Neurology

## 2024-06-23 ENCOUNTER — Encounter

## 2024-06-30 ENCOUNTER — Inpatient Hospital Stay: Admitting: Family Medicine

## 2024-07-03 ENCOUNTER — Telehealth: Payer: Self-pay | Admitting: Family Medicine

## 2024-07-03 ENCOUNTER — Telehealth: Payer: Self-pay

## 2024-07-03 NOTE — Transitions of Care (Post Inpatient/ED Visit) (Unsigned)
" ° °  07/03/2024  Name: [name] MRN: [id number] DOB: [birth year]  Today's TOC FU Call Status: Today's TOC FU Call Status:: Unsuccessful Call (1st Attempt) Unsuccessful Call (1st Attempt) Date: 07/03/24  Attempted to reach the patient regarding the most recent Inpatient/ED visit.  Follow Up Plan: Additional outreach attempts will be made to reach the patient to complete the Transitions of Care (Post Inpatient/ED visit) call.   Signature Julian Lemmings, LPN [facility] Nurse Health Advisor Direct Dial [phone]  "

## 2024-07-03 NOTE — Telephone Encounter (Signed)
 Copied from CRM [id number]. Topic: Clinical - Home Health Verbal Orders >> Jul 03, 2024 11:34 AM Emylou G wrote: Caller/Agency: Sharlet Cleaves w/[facility] Callback Number: [phone] secured line Service Requested: Skilled Nursing Frequency: [id number] [id number] 2 as needed Any new concerns about the patient? After care for knee amputation

## 2024-07-03 NOTE — Telephone Encounter (Signed)
 Called and gave verbal okay.

## 2024-07-07 ENCOUNTER — Ambulatory Visit (HOSPITAL_BASED_OUTPATIENT_CLINIC_OR_DEPARTMENT_OTHER): Admitting: Family

## 2024-07-07 ENCOUNTER — Encounter: Admitting: Vascular Surgery

## 2024-07-16 ENCOUNTER — Inpatient Hospital Stay: Admitting: Family Medicine

## 2024-11-16 ENCOUNTER — Ambulatory Visit: Payer: Self-pay
